# Patient Record
Sex: Female | Born: 1945 | Race: White | Hispanic: No | Marital: Married | Smoking: Former smoker
Health system: Southern US, Community
[De-identification: ages and names within clinical notes are randomized; demographics above are authoritative.]

## PROBLEM LIST (undated history)

## (undated) DIAGNOSIS — H269 Unspecified cataract: Secondary | ICD-10-CM

## (undated) DIAGNOSIS — F32A Depression, unspecified: Secondary | ICD-10-CM

## (undated) DIAGNOSIS — D126 Benign neoplasm of colon, unspecified: Secondary | ICD-10-CM

## (undated) DIAGNOSIS — M87059 Idiopathic aseptic necrosis of unspecified femur: Secondary | ICD-10-CM

## (undated) DIAGNOSIS — T7840XA Allergy, unspecified, initial encounter: Secondary | ICD-10-CM

## (undated) DIAGNOSIS — C3490 Malignant neoplasm of unspecified part of unspecified bronchus or lung: Secondary | ICD-10-CM

## (undated) DIAGNOSIS — C50919 Malignant neoplasm of unspecified site of unspecified female breast: Secondary | ICD-10-CM

## (undated) DIAGNOSIS — G609 Hereditary and idiopathic neuropathy, unspecified: Secondary | ICD-10-CM

## (undated) DIAGNOSIS — K8020 Calculus of gallbladder without cholecystitis without obstruction: Secondary | ICD-10-CM

## (undated) DIAGNOSIS — M1990 Unspecified osteoarthritis, unspecified site: Secondary | ICD-10-CM

## (undated) DIAGNOSIS — Z923 Personal history of irradiation: Secondary | ICD-10-CM

## (undated) DIAGNOSIS — J309 Allergic rhinitis, unspecified: Secondary | ICD-10-CM

## (undated) DIAGNOSIS — E785 Hyperlipidemia, unspecified: Secondary | ICD-10-CM

## (undated) DIAGNOSIS — F329 Major depressive disorder, single episode, unspecified: Secondary | ICD-10-CM

## (undated) DIAGNOSIS — L661 Lichen planopilaris, unspecified: Secondary | ICD-10-CM

## (undated) DIAGNOSIS — I2510 Atherosclerotic heart disease of native coronary artery without angina pectoris: Secondary | ICD-10-CM

## (undated) DIAGNOSIS — N390 Urinary tract infection, site not specified: Secondary | ICD-10-CM

## (undated) DIAGNOSIS — K219 Gastro-esophageal reflux disease without esophagitis: Secondary | ICD-10-CM

## (undated) DIAGNOSIS — K589 Irritable bowel syndrome without diarrhea: Secondary | ICD-10-CM

## (undated) DIAGNOSIS — R569 Unspecified convulsions: Secondary | ICD-10-CM

## (undated) HISTORY — DX: Unspecified cataract: H26.9

## (undated) HISTORY — DX: Hyperlipidemia, unspecified: E78.5

## (undated) HISTORY — DX: Depression, unspecified: F32.A

## (undated) HISTORY — DX: Malignant neoplasm of unspecified site of unspecified female breast: C50.919

## (undated) HISTORY — DX: Idiopathic aseptic necrosis of unspecified femur: M87.059

## (undated) HISTORY — DX: Gastro-esophageal reflux disease without esophagitis: K21.9

## (undated) HISTORY — PX: CATARACT EXTRACTION: SUR2

## (undated) HISTORY — DX: Malignant neoplasm of unspecified part of unspecified bronchus or lung: C34.90

## (undated) HISTORY — PX: UPPER GASTROINTESTINAL ENDOSCOPY: SHX188

## (undated) HISTORY — PX: TUBAL LIGATION: SHX77

## (undated) HISTORY — PX: EYE SURGERY: SHX253

## (undated) HISTORY — DX: Benign neoplasm of colon, unspecified: D12.6

## (undated) HISTORY — PX: COLONOSCOPY: SHX174

## (undated) HISTORY — DX: Allergy, unspecified, initial encounter: T78.40XA

## (undated) HISTORY — DX: Lichen planopilaris: L66.1

## (undated) HISTORY — PX: APPENDECTOMY: SHX54

## (undated) HISTORY — PX: TOTAL HIP ARTHROPLASTY: SHX124

## (undated) HISTORY — DX: Lichen planopilaris, unspecified: L66.10

## (undated) HISTORY — DX: Allergic rhinitis, unspecified: J30.9

## (undated) HISTORY — DX: Irritable bowel syndrome without diarrhea: K58.9

## (undated) HISTORY — DX: Unspecified convulsions: R56.9

## (undated) HISTORY — PX: OTHER SURGICAL HISTORY: SHX169

## (undated) HISTORY — DX: Hereditary and idiopathic neuropathy, unspecified: G60.9

## (undated) HISTORY — DX: Major depressive disorder, single episode, unspecified: F32.9

## (undated) HISTORY — PX: TOTAL KNEE ARTHROPLASTY: SHX125

## (undated) HISTORY — PX: LIPOSUCTION: SHX10

---

## 2013-09-23 DIAGNOSIS — Z923 Personal history of irradiation: Secondary | ICD-10-CM

## 2013-09-23 DIAGNOSIS — C50919 Malignant neoplasm of unspecified site of unspecified female breast: Secondary | ICD-10-CM

## 2013-09-23 HISTORY — DX: Malignant neoplasm of unspecified site of unspecified female breast: C50.919

## 2013-09-23 HISTORY — DX: Personal history of irradiation: Z92.3

## 2013-09-23 HISTORY — PX: BREAST LUMPECTOMY: SHX2

## 2015-02-10 ENCOUNTER — Other Ambulatory Visit: Payer: Self-pay | Admitting: Obstetrics and Gynecology

## 2015-02-14 ENCOUNTER — Other Ambulatory Visit: Payer: Self-pay | Admitting: Obstetrics and Gynecology

## 2015-02-14 DIAGNOSIS — Z9889 Other specified postprocedural states: Secondary | ICD-10-CM

## 2015-02-15 ENCOUNTER — Telehealth: Payer: Self-pay | Admitting: *Deleted

## 2015-02-15 NOTE — Telephone Encounter (Signed)
Received referral from Dr. Sherran Needs office to schedule pt for a med onc appt w/ Dr. Lindi Adie.  Called pt and confirmed 03/13/15 appt w/ pt.  Called and left messages at previous facilities to obtain records.  Mailed calendar, welcoming packet & intake form to pt.  Emailed Dr. Radene Knee to make him aware.

## 2015-02-22 ENCOUNTER — Ambulatory Visit
Admission: RE | Admit: 2015-02-22 | Discharge: 2015-02-22 | Disposition: A | Discharge status: Home / Self Care | Payer: Medicare Other | Source: Ambulatory Visit | Attending: Obstetrics and Gynecology | Admitting: Obstetrics and Gynecology

## 2015-02-22 DIAGNOSIS — Z9889 Other specified postprocedural states: Secondary | ICD-10-CM

## 2015-02-28 ENCOUNTER — Encounter: Payer: Self-pay | Admitting: *Deleted

## 2015-02-28 NOTE — Progress Notes (Signed)
Received all records from outside facilities.  Placed a copy in Dr. Geralyn Flash box and took one to HIM to scan.

## 2015-03-13 ENCOUNTER — Ambulatory Visit: Payer: Self-pay

## 2015-03-13 ENCOUNTER — Telehealth: Payer: Self-pay

## 2015-03-13 ENCOUNTER — Ambulatory Visit: Payer: Self-pay | Admitting: Hematology and Oncology

## 2015-03-13 ENCOUNTER — Telehealth: Payer: Self-pay | Admitting: *Deleted

## 2015-03-13 NOTE — Telephone Encounter (Signed)
LMOVM - pt to call clinic if she wants to reschedule todays appt.

## 2015-03-13 NOTE — Telephone Encounter (Signed)
Received message from Rhys Martini to call pt.  Called pt and she forgot about her appt today.  Rescheduled and confirmed 03/20/15 appt w/ pt.  Mailed new packet w/ calendar to pt.

## 2015-03-15 ENCOUNTER — Other Ambulatory Visit: Payer: Self-pay

## 2015-03-20 ENCOUNTER — Ambulatory Visit (HOSPITAL_BASED_OUTPATIENT_CLINIC_OR_DEPARTMENT_OTHER): Payer: Medicare Other | Admitting: Hematology and Oncology

## 2015-03-20 ENCOUNTER — Ambulatory Visit: Payer: Medicare Other

## 2015-03-20 ENCOUNTER — Telehealth: Payer: Self-pay | Admitting: Hematology and Oncology

## 2015-03-20 ENCOUNTER — Encounter: Payer: Self-pay | Admitting: Hematology and Oncology

## 2015-03-20 VITALS — BP 115/62 | HR 67 | Temp 98.2°F | Resp 20 | Wt 182.9 lb

## 2015-03-20 DIAGNOSIS — Z79811 Long term (current) use of aromatase inhibitors: Secondary | ICD-10-CM | POA: Diagnosis not present

## 2015-03-20 DIAGNOSIS — Z170 Estrogen receptor positive status [ER+]: Secondary | ICD-10-CM

## 2015-03-20 DIAGNOSIS — C50512 Malignant neoplasm of lower-outer quadrant of left female breast: Secondary | ICD-10-CM | POA: Insufficient documentation

## 2015-03-20 DIAGNOSIS — C50912 Malignant neoplasm of unspecified site of left female breast: Secondary | ICD-10-CM

## 2015-03-20 DIAGNOSIS — Z853 Personal history of malignant neoplasm of breast: Secondary | ICD-10-CM | POA: Insufficient documentation

## 2015-03-20 NOTE — Telephone Encounter (Signed)
Called patient and she is aware of her 1monthappointment

## 2015-03-20 NOTE — Progress Notes (Signed)
New pt intake form rcvd.  Chart updated.  Sent to scan.   Medical Records rcvd from pt.  Reviewed by Dr. Lindi Adie, Sent to scan.

## 2015-03-20 NOTE — Progress Notes (Signed)
I checked in new patient with no issues prior to seeing the dr

## 2015-03-20 NOTE — Assessment & Plan Note (Signed)
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 Severe emotional problems requiring Zoloft, Ambien, clonazepam which she attributes partially to the medication as well as stool her mother passing away last year along with moving to [place]. She is quite or rebound. 2. Skin dryness 3. Lack of feeling of wellness  Plan: 1. Treatment break from anastrozole for 3 months 2. When she returns if her symptoms haven't gotten better than we can associate those for anastrozole and we can plan on changing therapy to another aromatase inhibitor versus using tamoxifen.  Return to clinic in 3 months to assess benefits of treatment break and follow-up.

## 2015-03-20 NOTE — Progress Notes (Signed)
[facility] CONSULT NOTE  Patient Care Team: No Pcp Per Patient as PCP - General (General Practice)  CHIEF COMPLAINTS/PURPOSE OF CONSULTATION:  History of breast cancer   HISTORY OF PRESENTING ILLNESS:  Tara Murphy 69 y.o. female is here because of moving to [place] from [place]. She was diagnosed with left breast cancer with an abnormal mammogram end of October 2014. She subsequently underwent a biopsy and then underwent a lumpectomy in January 2015. She was diagnosed with stage IA breast cancer that was ER/PR positive HER-2 negative. She had Oncotype DX showing a recurrence score of 8, 6% ROR with tamoxifen alone. She was started on anastrozole after MammoSite radiation. She has been on anastrozole for the past year and reports that she has profound emotional problems. She was started on Zoloft and Ambien and clonazepam or the course of the last year to deal with her emotional problems. She is here because she more to [facility] and wanted to have oncology follow-up.  She stays as active as she can be with exercise [place] and staying busy and active all the time.  I reviewed her records extensively and collaborated the history with the patient.  SUMMARY OF ONCOLOGIC HISTORY:   Breast cancer, left breast   10/15/2013 Surgery Left breast lumpectomy: Well-differentiated IDC grade 1, 1.1 cm with intermediate grade DCIS solid and cribriform, margins negative, no LVID, 0/2 sentinel nodes negative, T1 cN0 M0 stage IA, Oncotype DX score 8, (6% ROR) ER/PR positive HER-2 negative   11/15/2013 - 11/16/2013 Radiation Therapy MammoSite partial breast radiation   12/17/2013 -  Anti-estrogen oral therapy Anastrozole 1 mg daily being held until 03/20/2015 for severe fatigue, emotional changes, skin dryness    MEDICAL HISTORY:  1. Lichen planus pilaris 2. 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 FAMILY HISTORY: No family history of breast cancer  ALLERGIES:  has No Known Allergies.  MEDICATIONS:  Current Outpatient Prescriptions  Medication Sig Dispense Refill  . anastrozole (ARIMIDEX) 1 MG tablet     . clobetasol (TEMOVATE) 0.05 % external solution     . pantoprazole (PROTONIX) 40 MG tablet     . sertraline (ZOLOFT) 50 MG tablet     . zolpidem (AMBIEN) 5 MG tablet      No current facility-administered medications for this visit.    REVIEW OF SYSTEMS:   Constitutional: Denies fevers, chills or abnormal night sweats Eyes: Denies blurriness of vision, double vision or watery eyes Ears, nose, mouth, throat, and face: Denies mucositis or sore throat Respiratory: Denies cough, dyspnea or wheezes Cardiovascular: Denies palpitation, chest discomfort or lower extremity swelling Gastrointestinal:  Denies nausea, heartburn or change in bowel habits Skin: Denies abnormal skin rashes Lymphatics: Denies new lymphadenopathy or easy bruising Neurological:Denies numbness, tingling or new weaknesses Behavioral/Psych: Mood is stable, no new changes  Breast:  Denies any palpable lumps or discharge All other systems were reviewed with the patient and are negative.  PHYSICAL EXAMINATION: ECOG PERFORMANCE STATUS: 0 - Asymptomatic  Filed Vitals:   03/20/15 1230  BP: 115/62  Pulse: 67  Temp: 98.2 F (36.8  C)  Resp: 20   Filed Weights   03/20/15 1230  Weight: 182 lb 14.4 oz (82.963 kg)    GENERAL:alert, no distress and comfortable SKIN: skin color, texture, turgor are normal, no rashes or significant lesions EYES: normal, conjunctiva are pink  and non-injected, sclera clear OROPHARYNX:no exudate, no erythema and lips, buccal mucosa, and tongue normal  NECK: supple, thyroid normal size, non-tender, without nodularity LYMPH:  no palpable lymphadenopathy in the cervical, axillary or inguinal LUNGS: clear to auscultation and percussion with normal breathing effort HEART: regular rate & rhythm and no murmurs and no lower extremity edema ABDOMEN:abdomen soft, non-tender and normal bowel sounds Musculoskeletal:no cyanosis of digits and no clubbing  PSYCH: alert & oriented x 3 with fluent speech NEURO: no focal motor/sensory deficits  ASSESSMENT AND PLAN:  Breast cancer, left breast 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 Severe emotional problems requiring Zoloft, Ambien, clonazepam which she attributes partially to the medication as well as stool her mother passing away last year along with moving to [place]. She is quite or rebound. 2. Skin dryness 3. Lack of feeling of wellness  Plan: 1. Treatment break from anastrozole for 3 months 2. When she returns if her symptoms haven't gotten better than we can associate those for anastrozole and we can plan on changing therapy to another aromatase inhibitor versus using tamoxifen.  Return to clinic in 3 months to assess benefits of treatment break and follow-up.  All questions were answered. The patient knows to call the clinic with any problems, questions or concerns.    Rulon Eisenmenger, MD 1:48 PM

## 2015-05-10 ENCOUNTER — Ambulatory Visit
Admission: RE | Admit: 2015-05-10 | Discharge: 2015-05-10 | Disposition: A | Discharge status: Home / Self Care | Payer: Medicare Other | Source: Ambulatory Visit | Attending: Obstetrics and Gynecology | Admitting: Obstetrics and Gynecology

## 2015-05-10 ENCOUNTER — Encounter (INDEPENDENT_AMBULATORY_CARE_PROVIDER_SITE_OTHER): Payer: Self-pay

## 2015-06-13 DIAGNOSIS — J309 Allergic rhinitis, unspecified: Secondary | ICD-10-CM | POA: Insufficient documentation

## 2015-06-13 DIAGNOSIS — N390 Urinary tract infection, site not specified: Secondary | ICD-10-CM | POA: Insufficient documentation

## 2015-06-13 DIAGNOSIS — G609 Hereditary and idiopathic neuropathy, unspecified: Secondary | ICD-10-CM | POA: Insufficient documentation

## 2015-06-13 DIAGNOSIS — Z8739 Personal history of other diseases of the musculoskeletal system and connective tissue: Secondary | ICD-10-CM | POA: Insufficient documentation

## 2015-06-16 DIAGNOSIS — Z8619 Personal history of other infectious and parasitic diseases: Secondary | ICD-10-CM | POA: Insufficient documentation

## 2015-06-19 NOTE — Assessment & Plan Note (Signed)
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 Severe emotional problems requiring Zoloft, Ambien, clonazepam which she attributes partially to the medication as well as stool her mother passing away last year along with moving to [place]. She is quite or rebound. 2. Skin dryness 3. Lack of feeling of wellness  Plan: 1. Treatment break from anastrozole for 3 months 2.  Return to clinic in 3 months to assess benefits of treatment break and follow-up.

## 2015-06-20 ENCOUNTER — Ambulatory Visit (HOSPITAL_BASED_OUTPATIENT_CLINIC_OR_DEPARTMENT_OTHER): Payer: Medicare Other | Admitting: Hematology and Oncology

## 2015-06-20 ENCOUNTER — Encounter: Payer: Self-pay | Admitting: Hematology and Oncology

## 2015-06-20 ENCOUNTER — Telehealth: Payer: Self-pay | Admitting: Hematology and Oncology

## 2015-06-20 VITALS — BP 132/67 | HR 75 | Temp 98.8°F | Resp 20 | Ht 67.0 in | Wt 185.3 lb

## 2015-06-20 DIAGNOSIS — C50912 Malignant neoplasm of unspecified site of left female breast: Secondary | ICD-10-CM | POA: Diagnosis present

## 2015-06-20 MED ORDER — TAMOXIFEN CITRATE 20 MG PO TABS: 20.0000 mg | ORAL_TABLET | Freq: Every day | ORAL | Status: DC

## 2015-06-20 NOTE — Telephone Encounter (Signed)
Gave avs & calendar for December. °

## 2015-06-20 NOTE — Progress Notes (Signed)
Patient Care Team: No Pcp Per Patient as PCP - General (General Practice)  DIAGNOSIS: No matching staging information was found for the patient.  SUMMARY OF ONCOLOGIC HISTORY:   Breast cancer, left breast   10/15/2013 Surgery Left breast lumpectomy: Well-differentiated IDC grade 1, 1.1 cm with intermediate grade DCIS solid and cribriform, margins negative, no LVID, 0/2 sentinel nodes negative, T1 cN0 M0 stage IA, Oncotype DX score 8, (6% ROR) ER/PR positive HER-2 negative   11/15/2013 - 11/16/2013 Radiation Therapy MammoSite partial breast radiation   12/17/2013 -  Anti-estrogen oral therapy Anastrozole 1 mg daily (severe fatigue, emotional changes, skin dryness), switched to tamoxifen 06/20/2015    CHIEF COMPLIANT: follow-up after treatment break on anastrozole  INTERVAL HISTORY: Tara Murphy is a 69 year old lady with above-mentioned history of left breast cancer who could not tolerate anastrozole. She had profound emotional changes and she came off anastrozole for 3 months. She is here today to discuss a treatment plan. Energy levels had improved emotional changes had gotten better. But she would like to try an alternative antiestrogen treatment. She was also diagnosed with C. Difficile enterocolitis and is on treatment for it. She is also concerned that she has low back problems from arthritis. She is not thinking that some of the side effects she experienced may not entirely be related to the pill.  REVIEW OF SYSTEMS:   Constitutional: Denies fevers, chills or abnormal weight loss Eyes: Denies blurriness of vision Ears, nose, mouth, throat, and face: Denies mucositis or sore throat Respiratory: Denies cough, dyspnea or wheezes Cardiovascular: Denies palpitation, chest discomfort or lower extremity swelling Gastrointestinal:  Denies nausea, heartburn or change in bowel habits Skin: Denies abnormal skin rashes Lymphatics: Denies new lymphadenopathy or easy bruising Neurological:Denies  numbness, tingling or new weaknesses Behavioral/Psych: Mood is stable, no new changes   All other systems were reviewed with the patient and are negative.  I have reviewed the past medical history, past surgical history, social history and family history with the patient and they are unchanged from previous note.  ALLERGIES:  is allergic to contrast media; fentanyl; hydromorphone; and terbinafine and related.  MEDICATIONS:  Current Outpatient Prescriptions  Medication Sig Dispense Refill  . clobetasol (TEMOVATE) 0.05 % external solution     . clonazePAM (KLONOPIN) 0.5 MG tablet Take 0.5 mg by mouth daily.    . fluticasone (FLONASE) 50 MCG/ACT nasal spray Place 1 spray into the nose.    . gabapentin (NEURONTIN) 300 MG capsule Take 300 mg by mouth 2 (two) times daily.    . pantoprazole (PROTONIX) 40 MG tablet     . sertraline (ZOLOFT) 50 MG tablet     . zolpidem (AMBIEN) 5 MG tablet     . tamoxifen (NOLVADEX) 20 MG tablet Take 1 tablet (20 mg total) by mouth daily. 90 tablet 3   No current facility-administered medications for this visit.    PHYSICAL EXAMINATION: ECOG PERFORMANCE STATUS: 1 - Symptomatic but completely ambulatory  Filed Vitals:   06/20/15 1320  BP: 132/67  Pulse: 75  Temp: 98.8 F (37.1 C)  Resp: 20   Filed Weights   06/20/15 1320  Weight: 185 lb 4.8 oz (84.052 kg)    GENERAL:alert, no distress and comfortable SKIN: skin color, texture, turgor are normal, no rashes or significant lesions EYES: normal, Conjunctiva are pink and non-injected, sclera clear OROPHARYNX:no exudate, no erythema and lips, buccal mucosa, and tongue normal  NECK: supple, thyroid normal size, non-tender, without nodularity LYMPH:  no palpable lymphadenopathy in  the cervical, axillary or inguinal LUNGS: clear to auscultation and percussion with normal breathing effort HEART: regular rate & rhythm and no murmurs and no lower extremity edema ABDOMEN:abdomen soft, non-tender and normal  bowel sounds Musculoskeletal:no cyanosis of digits and no clubbing  NEURO: lower extremity pain with exertion  LABORATORY DATA:  I have reviewed the data as listed   Chemistry   No results found for: NA, K, CL, CO2, BUN, CREATININE, GLU No results found for: CALCIUM, ALKPHOS, AST, ALT, BILITOT     No results found for: WBC, HGB, HCT, MCV, PLT, NEUTROABS   ASSESSMENT & PLAN:  Breast cancer, left breast 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 Skin dryness and Lack of feeling of wellness  Plan: 1. Try tamoxifen 20 mg daily 2. If she cannot tolerate tamoxifen and then we will discontinue anti-estrogen therapy. Return to clinic in 3 months to assess toxicities of tamoxifen and follow-up.  Patient joined [employer] and is excited about getting exercise.  No orders of the defined types were placed in this encounter.   The patient has a good understanding of the overall plan. she agrees with it. she will call with any problems that may develop before the next visit here.   Rulon Eisenmenger, MD

## 2015-09-04 NOTE — Assessment & Plan Note (Signed)
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 Skin dryness and Lack of feeling of wellness  Tamoxifen Toxicities:   2. If she cannot tolerate tamoxifen and then we will discontinue anti-estrogen therapy. Return to clinic in 6 months to assess toxicities of tamoxifen and follow-up.

## 2015-09-05 ENCOUNTER — Ambulatory Visit (HOSPITAL_BASED_OUTPATIENT_CLINIC_OR_DEPARTMENT_OTHER): Payer: Medicare Other | Admitting: Hematology and Oncology

## 2015-09-05 ENCOUNTER — Encounter: Payer: Self-pay | Admitting: Hematology and Oncology

## 2015-09-05 VITALS — BP 131/65 | HR 86 | Temp 97.5°F | Resp 18 | Ht 67.0 in | Wt 188.3 lb

## 2015-09-05 DIAGNOSIS — C50512 Malignant neoplasm of lower-outer quadrant of left female breast: Secondary | ICD-10-CM

## 2015-09-05 DIAGNOSIS — F329 Major depressive disorder, single episode, unspecified: Secondary | ICD-10-CM | POA: Insufficient documentation

## 2015-09-05 DIAGNOSIS — Z853 Personal history of malignant neoplasm of breast: Secondary | ICD-10-CM | POA: Diagnosis present

## 2015-09-05 HISTORY — DX: Major depressive disorder, single episode, unspecified: F32.9

## 2015-09-05 NOTE — Progress Notes (Signed)
Patient Care Team: No Pcp Per Patient as PCP - General (General Practice)  DIAGNOSIS: No matching staging information was found for the patient.  SUMMARY OF ONCOLOGIC HISTORY:   Breast cancer of lower-outer quadrant of left female breast ([place])   10/15/2013 Surgery Left breast lumpectomy: Well-differentiated IDC grade 1, 1.1 cm with intermediate grade DCIS solid and cribriform, margins negative, no LVID, 0/2 sentinel nodes negative, T1 cN0 M0 stage IA, Oncotype DX score 8, (6% ROR) ER/PR positive HER-2 negative   11/15/2013 - 11/16/2013 Radiation Therapy MammoSite partial breast radiation   12/17/2013 - 08/29/2015 Anti-estrogen oral therapy Anastrozole 1 mg daily (severe fatigue, emotional changes, skin dryness), switched to tamoxifen 06/20/2015 (stopped due to depression)    CHIEF COMPLIANT: stopped tamoxifen therapy  INTERVAL HISTORY: Tara Murphy is a 69 year old with above-mentioned history of left breast cancer treated with lumpectomy and MammoSite radiation. She was on anastrozole which was later switched to tamoxifen because of psychological problems. She tolerated tamoxifen lot better however she had episodes of depression and hence she discontinued it on 08/29/2015. She does not want to take any antiestrogen therapy further.  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  I have reviewed the past medical history, past surgical history, social history and family history with the patient and they are unchanged from previous note.  ALLERGIES:  is allergic to contrast media; fentanyl; hydromorphone; and terbinafine and related.  MEDICATIONS:  Current Outpatient Prescriptions  Medication Sig Dispense Refill  . clobetasol (TEMOVATE) 0.05 % external solution     . clonazePAM (KLONOPIN) 0.5 MG tablet Take 0.5 mg by mouth daily.    . fluticasone (FLONASE) 50 MCG/ACT nasal spray Place 1 spray into the nose.    . gabapentin (NEURONTIN) 300 MG capsule Take 300 mg by mouth 2 (two) times daily.    . pantoprazole (PROTONIX) 40 MG tablet     . sertraline (ZOLOFT) 50 MG tablet     . tamoxifen (NOLVADEX) 20 MG tablet Take 1 tablet (20 mg total) by mouth daily. 90 tablet 3  . zolpidem (AMBIEN) 5 MG tablet      No current facility-administered medications for this visit.    PHYSICAL EXAMINATION: ECOG PERFORMANCE STATUS: 1 - Symptomatic but completely ambulatory  Filed Vitals:   09/05/15 1406  BP: 131/65  Pulse: 86  Temp: 97.5 F (36.4 C)  Resp: 18   Filed Weights   09/05/15 1406  Weight: 188 lb 4.8 oz (85.412 kg)    GENERAL:alert, no distress and comfortable SKIN: skin color, texture, turgor are normal, no rashes or significant lesions EYES: normal, Conjunctiva are pink and non-injected, sclera clear OROPHARYNX:no exudate, no erythema and lips, buccal mucosa, and tongue normal  NECK: supple, thyroid normal size, non-tender, without nodularity LYMPH:  no palpable lymphadenopathy in the cervical, axillary or inguinal LUNGS: clear to auscultation and percussion with normal breathing effort HEART: regular rate & rhythm and no murmurs and no lower extremity  edema ABDOMEN:abdomen soft, non-tender and normal bowel sounds Musculoskeletal:no cyanosis of digits and no clubbing  NEURO: alert & oriented x 3 with fluent  speech, no focal motor/sensory deficits   ASSESSMENT & PLAN:  Breast cancer of lower-outer quadrant of left female breast ([place]) 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 Skin dryness and Lack of feeling of wellness  Tamoxifen Toxicities: she tolerated tamoxifen better but she had episodes of major depression and hence she discontinued it after being on it for 3 months.  Return to clinic in 1 year for follow-up  No orders of the defined types were placed in this encounter.   The patient has a good understanding of the overall plan. she agrees with it. she will call with any problems that may develop before the next visit here.   Rulon Eisenmenger, MD 09/05/2015

## 2015-09-06 ENCOUNTER — Telehealth: Payer: Self-pay | Admitting: Hematology and Oncology

## 2015-09-06 NOTE — Telephone Encounter (Signed)
s.w. pt and advised DEC appt....pt ok and aware

## 2015-09-27 DIAGNOSIS — M4726 Other spondylosis with radiculopathy, lumbar region: Secondary | ICD-10-CM | POA: Insufficient documentation

## 2015-09-29 ENCOUNTER — Encounter (HOSPITAL_COMMUNITY): Payer: Self-pay | Admitting: *Deleted

## 2015-09-29 ENCOUNTER — Emergency Department (HOSPITAL_COMMUNITY)
Admission: EM | Admit: 2015-09-29 | Discharge: 2015-09-29 | Disposition: A | Discharge status: Home / Self Care | Payer: Medicare Other | Attending: Emergency Medicine | Admitting: Emergency Medicine

## 2015-09-29 DIAGNOSIS — M25551 Pain in right hip: Secondary | ICD-10-CM

## 2015-09-29 DIAGNOSIS — Z872 Personal history of diseases of the skin and subcutaneous tissue: Secondary | ICD-10-CM | POA: Insufficient documentation

## 2015-09-29 DIAGNOSIS — Z87891 Personal history of nicotine dependence: Secondary | ICD-10-CM | POA: Diagnosis not present

## 2015-09-29 DIAGNOSIS — Z853 Personal history of malignant neoplasm of breast: Secondary | ICD-10-CM | POA: Diagnosis not present

## 2015-09-29 DIAGNOSIS — Z79899 Other long term (current) drug therapy: Secondary | ICD-10-CM | POA: Insufficient documentation

## 2015-09-29 DIAGNOSIS — R1030 Lower abdominal pain, unspecified: Secondary | ICD-10-CM | POA: Insufficient documentation

## 2015-09-29 NOTE — ED Notes (Addendum)
Pt reports right hip pain and groin pain, has been to pcp and had xray and MRI ordered. Pt went to get MRI done last night but unable to tolerate lying on her back due to pain. Requesting an MRI with sedation. Pt took percocet pta.

## 2015-09-29 NOTE — Discharge Instructions (Signed)

## 2015-09-29 NOTE — ED Provider Notes (Signed)
CSN: [id number]     Arrival date & time 09/29/15  1339 History   First MD Initiated Contact with Patient 09/29/15 1708     Chief Complaint  Patient presents with  . Hip Pain  . Groin Pain     (Consider location/radiation/quality/duration/timing/severity/associated sxs/prior Treatment) Patient is a 70 y.o. female presenting with hip pain and groin pain. The history is provided by the patient.  Hip Pain This is a chronic problem. The problem occurs constantly. The problem has been gradually worsening. Pertinent negatives include no chest pain and no abdominal pain. Nothing aggravates the symptoms. Nothing relieves the symptoms. Treatments tried: percocet/hydrocodone.  Groin Pain Pertinent negatives include no chest pain and no abdominal pain.    Past Medical History  Diagnosis Date  . Breast cancer ([place])   . Lichen plano-pilaris    Past Surgical History  Procedure Laterality Date  . Appendectomy    . Total hip arthroplasty    . Total knee arthroplasty    . Tubal ligation    . Cataracts    . Liposuction     History reviewed. No pertinent family history. Social History  Substance Use Topics  . Smoking status: Former Smoker    Types: Cigarettes    Quit date: 11/22/1991  . Smokeless tobacco: Never Used  . Alcohol Use: 4.2 oz/week    7 Glasses of wine per week   OB History    No data available     Review of Systems  Cardiovascular: Negative for chest pain.  Gastrointestinal: Negative for abdominal pain.  All other systems reviewed and are negative.     Allergies  Contrast media; Fentanyl; Hydromorphone; and Terbinafine and related  Home Medications   Prior to Admission medications   Medication Sig Start Date End Date Taking? Authorizing Provider  clobetasol (TEMOVATE) 0.05 % external solution  03/05/15   Historical Provider, MD  clonazePAM (KLONOPIN) 0.5 MG tablet Take 0.5 mg by mouth daily.    Historical Provider, MD  fluticasone (FLONASE) 50 MCG/ACT nasal spray  Place 1 spray into the nose.    Historical Provider, MD  gabapentin (NEURONTIN) 300 MG capsule Take 300 mg by mouth 2 (two) times daily.    Historical Provider, MD  pantoprazole (PROTONIX) 40 MG tablet  02/22/15   Historical Provider, MD  sertraline (ZOLOFT) 50 MG tablet  03/15/15   Historical Provider, MD  tamoxifen (NOLVADEX) 20 MG tablet Take 1 tablet (20 mg total) by mouth daily. 06/20/15   Nicholas Lose, MD  zolpidem (AMBIEN) 5 MG tablet  02/22/15   Historical Provider, MD   BP 132/73 mmHg  Pulse 85  Temp(Src) 97.9 F (36.6 C) (Oral)  Resp 18  SpO2 99% Physical Exam  Constitutional: She is oriented to person, place, and time. She appears well-developed and well-nourished. No distress.  HENT:  Head: Normocephalic.  Eyes: Conjunctivae are normal.  Neck: Neck supple. No tracheal deviation present.  Cardiovascular: Normal rate and regular rhythm.   Pulmonary/Chest: Effort normal. No respiratory distress.  Abdominal: Soft. She exhibits no distension.  Neurological: She is alert and oriented to person, place, and time.  Skin: Skin is warm and dry.  Psychiatric: She has a normal mood and affect.    ED Course  Procedures (including critical care time) Labs Review Labs Reviewed - No data to display  Imaging Review No results found. I have personally reviewed and evaluated these images and lab results as part of my medical decision-making.   EKG Interpretation None  MDM   Final diagnoses:  Right hip pain    70 year old female presenting with right hip pain that has been ongoing for multiple weeks. She had been seen by her primary care physician for pain in her groin and was unable to tolerate an outpatient MRI. She was instructed to come here for MRI with sedation. I explained to the patient that we do not do outpatient MRI from the emergency department and we do not offer elective sedation services. She is instructed to speak with her primary care doctor regarding  rescheduling.    Leo Grosser, MD 09/29/15 [phone]

## 2015-10-04 ENCOUNTER — Other Ambulatory Visit: Payer: Self-pay | Admitting: Orthopedic Surgery

## 2015-10-04 DIAGNOSIS — M25551 Pain in right hip: Secondary | ICD-10-CM

## 2015-10-07 ENCOUNTER — Ambulatory Visit
Admission: RE | Admit: 2015-10-07 | Discharge: 2015-10-07 | Disposition: A | Discharge status: Home / Self Care | Payer: Medicare Other | Source: Ambulatory Visit | Attending: Orthopedic Surgery | Admitting: Orthopedic Surgery

## 2015-10-07 DIAGNOSIS — M25551 Pain in right hip: Secondary | ICD-10-CM

## 2015-12-05 ENCOUNTER — Ambulatory Visit: Payer: Self-pay | Admitting: Orthopedic Surgery

## 2015-12-06 NOTE — Patient Instructions (Signed)
Tara Murphy  12/06/2015   Your procedure is scheduled on: 11/23/2015    Report to [facility] Main  Entrance take [facility]  elevators to 3rd floor to  [place] at    1045 AM.  Call this number if you have problems the morning of surgery [phone]   Remember: ONLY 1 PERSON MAY GO WITH YOU TO SHORT STAY TO GET  READY MORNING OF [place].  Do not eat food after midnite.  May have clear liquids from 12 midnite until 0700am then nothing by mouth.       Take these medicines the morning of surgery with A SIP OF WATER: Flonase if needed, Hydrocodone if needed                                You may not have any metal on your body including hair pins and              piercings  Do not wear jewelry, make-up, lotions, powders or perfumes, deodorant             Do not wear nail polish.  Do not shave  48 hours prior to surgery.                Do not bring valuables to the hospital. [place].  Contacts, dentures or bridgework may not be worn into surgery.  Leave suitcase in the car. After surgery it may be brought to your room.         Special Instructions: coughing and deep breathing exercises, leg exercises               Please read over the following fact sheets you were given: _____________________________________________________________________                CLEAR LIQUID DIET   Foods Allowed                                                                     Foods Excluded  Coffee and tea, regular and decaf                             liquids that you cannot  Plain Jell-O in any flavor                                             see through such as: Fruit ices (not with fruit pulp)                                     milk, soups, orange juice  Iced Popsicles  All solid food Carbonated beverages, regular and diet                                    Cranberry,  grape and apple juices Sports drinks like Gatorade Lightly seasoned clear broth or consume(fat free) Sugar, honey syrup  Sample Menu Breakfast                                Lunch                                     Supper Cranberry juice                    Beef broth                            Chicken broth Jell-O                                     Grape juice                           Apple juice Coffee or tea                        Jell-O                                      Popsicle                                                Coffee or tea                        Coffee or tea  _____________________________________________________________________  [facility] - Preparing for Surgery Before surgery, you can play an important role.  Because skin is not sterile, your skin needs to be as free of germs as possible.  You can reduce the number of germs on your skin by washing with CHG (chlorahexidine gluconate) soap before surgery.  CHG is an antiseptic cleaner which kills germs and bonds with the skin to continue killing germs even after washing. Please DO NOT use if you have an allergy to CHG or antibacterial soaps.  If your skin becomes reddened/irritated stop using the CHG and inform your nurse when you arrive at Short Stay. Do not shave (including legs and underarms) for at least 48 hours prior to the first CHG shower.  You may shave your face/neck. Please follow these instructions carefully:  1.  Shower with CHG Soap the night before surgery and the  morning of Surgery.  2.  If you choose to wash your hair, wash your hair first as usual with your  normal  shampoo.  3.  After you shampoo, rinse your hair and body thoroughly to remove the  shampoo.  4.  Use CHG as you would any other liquid soap.  You can apply chg directly  to the skin and wash                       Gently with a scrungie or clean washcloth.  5.  Apply the CHG Soap to your body ONLY FROM THE NECK  DOWN.   Do not use on face/ open                           Wound or open sores. Avoid contact with eyes, ears mouth and genitals (private parts).                       Wash face,  Genitals (private parts) with your normal soap.             6.  Wash thoroughly, paying special attention to the area where your surgery  will be performed.  7.  Thoroughly rinse your body with warm water from the neck down.  8.  DO NOT shower/wash with your normal soap after using and rinsing off  the CHG Soap.                9.  Pat yourself dry with a clean towel.            10.  Wear clean pajamas.            11.  Place clean sheets on your bed the night of your first shower and do not  sleep with pets. Day of Surgery : Do not apply any lotions/deodorants the morning of surgery.  Please wear clean clothes to the hospital/surgery center.  FAILURE TO FOLLOW THESE INSTRUCTIONS MAY RESULT IN THE CANCELLATION OF YOUR SURGERY PATIENT SIGNATURE_________________________________  NURSE SIGNATURE__________________________________  ________________________________________________________________________  WHAT IS A BLOOD TRANSFUSION? Blood Transfusion Information  A transfusion is the replacement of blood or some of its parts. Blood is made up of multiple cells which provide different functions.  Red blood cells carry oxygen and are used for blood loss replacement.  White blood cells fight against infection.  Platelets control bleeding.  Plasma helps clot blood.  Other blood products are available for specialized needs, such as hemophilia or other clotting disorders. BEFORE THE TRANSFUSION  Who gives blood for transfusions?   Healthy volunteers who are fully evaluated to make sure their blood is safe. This is blood bank blood. Transfusion therapy is the safest it has ever been in the practice of medicine. Before blood is taken from a donor, a complete history is taken to make sure that person has no history of  diseases nor engages in risky social behavior (examples are intravenous drug use or sexual activity with multiple partners). The donor's travel history is screened to minimize risk of transmitting infections, such as malaria. The donated blood is tested for signs of infectious diseases, such as HIV and hepatitis. The blood is then tested to be sure it is compatible with you in order to minimize the chance of a transfusion reaction. If you or a relative donates blood, this is often done in anticipation of surgery and is not appropriate for emergency situations. It takes many days to process the donated blood. RISKS AND COMPLICATIONS Although transfusion therapy is very safe and saves many lives, the main dangers of transfusion include:  1. Getting an infectious disease. 2. Developing a transfusion reaction.  This is an allergic reaction to something in the blood you were given. Every precaution is taken to prevent this. The decision to have a blood transfusion has been considered carefully by your caregiver before blood is given. Blood is not given unless the benefits outweigh the risks. AFTER THE TRANSFUSION  Right after receiving a blood transfusion, you will usually feel much better and more energetic. This is especially true if your red blood cells have gotten low (anemic). The transfusion raises the level of the red blood cells which carry oxygen, and this usually causes an energy increase.  The nurse administering the transfusion will monitor you carefully for complications. HOME CARE INSTRUCTIONS  No special instructions are needed after a transfusion. You may find your energy is better. Speak with your caregiver about any limitations on activity for underlying diseases you may have. SEEK MEDICAL CARE IF:   Your condition is not improving after your transfusion.  You develop redness or irritation at the intravenous (IV) site. SEEK IMMEDIATE MEDICAL CARE IF:  Any of the following symptoms  occur over the next 12 hours:  Shaking chills.  You have a temperature by mouth above 102 F (38.9 C), not controlled by medicine.  Chest, back, or muscle pain.  People around you feel you are not acting correctly or are confused.  Shortness of breath or difficulty breathing.  Dizziness and fainting.  You get a rash or develop hives.  You have a decrease in urine output.  Your urine turns a dark color or changes to pink, red, or brown. Any of the following symptoms occur over the next 10 days:  You have a temperature by mouth above 102 F (38.9 C), not controlled by medicine.  Shortness of breath.  Weakness after normal activity.  The white part of the eye turns yellow (jaundice).  You have a decrease in the amount of urine or are urinating less often.  Your urine turns a dark color or changes to pink, red, or brown. Document Released: 09/06/2000 Document Revised: 12/02/2011 Document Reviewed: 04/25/2008 ExitCare Patient Information 2014 [place].  _______________________________________________________________________  Incentive Spirometer  An incentive spirometer is a tool that can help keep your lungs clear and active. This tool measures how well you are filling your lungs with each breath. Taking long deep breaths may help reverse or decrease the chance of developing breathing (pulmonary) problems (especially infection) following:  A long period of time when you are unable to move or be active. BEFORE THE PROCEDURE   If the spirometer includes an indicator to show your best effort, your nurse or respiratory therapist will set it to a desired goal.  If possible, sit up straight or lean slightly forward. Try not to slouch.  Hold the incentive spirometer in an upright position. INSTRUCTIONS FOR USE  3. Sit on the edge of your bed if possible, or sit up as far as you can in bed or on a chair. 4. Hold the incentive spirometer in an upright  position. 5. Breathe out normally. 6. Place the mouthpiece in your mouth and seal your lips tightly around it. 7. Breathe in slowly and as deeply as possible, raising the piston or the ball toward the top of the column. 8. Hold your breath for 3-5 seconds or for as long as possible. Allow the piston or ball to fall to the bottom of the column. 9. Remove the mouthpiece from your mouth and breathe out normally. 10. Rest for a few seconds and repeat Steps  1 through 7 at least 10 times every 1-2 hours when you are awake. Take your time and take a few normal breaths between deep breaths. 11. The spirometer may include an indicator to show your best effort. Use the indicator as a goal to work toward during each repetition. 12. After each set of 10 deep breaths, practice coughing to be sure your lungs are clear. If you have an incision (the cut made at the time of surgery), support your incision when coughing by placing a pillow or rolled up towels firmly against it. Once you are able to get out of bed, walk around indoors and cough well. You may stop using the incentive spirometer when instructed by your caregiver.  RISKS AND COMPLICATIONS  Take your time so you do not get dizzy or light-headed.  If you are in pain, you may need to take or ask for pain medication before doing incentive spirometry. It is harder to take a deep breath if you are having pain. AFTER USE  Rest and breathe slowly and easily.  It can be helpful to keep track of a log of your progress. Your caregiver can provide you with a simple table to help with this. If you are using the spirometer at home, follow these instructions: [place] IF:   You are having difficultly using the spirometer.  You have trouble using the spirometer as often as instructed.  Your pain medication is not giving enough relief while using the spirometer.  You develop fever of 100.5 F (38.1 C) or higher. SEEK IMMEDIATE MEDICAL CARE IF:    You cough up bloody sputum that had not been present before.  You develop fever of 102 F (38.9 C) or greater.  You develop worsening pain at or near the incision site. MAKE SURE YOU:   Understand these instructions.  Will watch your condition.  Will get help right away if you are not doing well or get worse. Document Released: 01/20/2007 Document Revised: 12/02/2011 Document Reviewed: 03/23/2007 [facility] Patient Information 2014 [place], [place].   ________________________________________________________________________

## 2015-12-07 ENCOUNTER — Encounter (HOSPITAL_COMMUNITY): Payer: Self-pay

## 2015-12-07 ENCOUNTER — Encounter (HOSPITAL_COMMUNITY)
Admission: RE | Admit: 2015-12-07 | Discharge: 2015-12-07 | Disposition: A | Discharge status: Home / Self Care | Payer: Medicare Other | Source: Ambulatory Visit | Attending: Orthopedic Surgery | Admitting: Orthopedic Surgery

## 2015-12-07 DIAGNOSIS — Z01812 Encounter for preprocedural laboratory examination: Secondary | ICD-10-CM | POA: Diagnosis not present

## 2015-12-07 DIAGNOSIS — M1611 Unilateral primary osteoarthritis, right hip: Secondary | ICD-10-CM | POA: Insufficient documentation

## 2015-12-07 DIAGNOSIS — Z0183 Encounter for blood typing: Secondary | ICD-10-CM | POA: Diagnosis not present

## 2015-12-07 HISTORY — DX: Unspecified osteoarthritis, unspecified site: M19.90

## 2015-12-07 HISTORY — DX: Urinary tract infection, site not specified: N39.0

## 2015-12-07 LAB — TYPE AND SCREEN
ABO/RH(D): O POS
ANTIBODY SCREEN: NEGATIVE

## 2015-12-07 LAB — URINALYSIS, ROUTINE W REFLEX MICROSCOPIC
BILIRUBIN URINE: NEGATIVE
Glucose, UA: NEGATIVE mg/dL
HGB URINE DIPSTICK: NEGATIVE
Ketones, ur: NEGATIVE mg/dL
Nitrite: NEGATIVE
PH: 5.5 (ref 5.0–8.0)
Protein, ur: NEGATIVE mg/dL
SPECIFIC GRAVITY, URINE: 1.01 (ref 1.005–1.030)

## 2015-12-07 LAB — COMPREHENSIVE METABOLIC PANEL
ALK PHOS: 89 U/L (ref 38–126)
ALT: 21 U/L (ref 14–54)
AST: 21 U/L (ref 15–41)
Albumin: 4.4 g/dL (ref 3.5–5.0)
Anion gap: 10 (ref 5–15)
BUN: 12 mg/dL (ref 6–20)
CHLORIDE: 105 mmol/L (ref 101–111)
CO2: 24 mmol/L (ref 22–32)
Calcium: 9.5 mg/dL (ref 8.9–10.3)
Creatinine, Ser: 0.58 mg/dL (ref 0.44–1.00)
GFR calc Af Amer: >60 mL/min (ref >60)
Glucose, Bld: 92 mg/dL (ref 65–99)
Potassium: 4.8 mmol/L (ref 3.5–5.1)
SODIUM: 139 mmol/L (ref 135–145)
TOTAL PROTEIN: 7.6 g/dL (ref 6.5–8.1)
Total Bilirubin: 0.5 mg/dL (ref 0.3–1.2)

## 2015-12-07 LAB — ABO/RH: ABO/RH(D): O POS

## 2015-12-07 LAB — SURGICAL PCR SCREEN
MRSA, PCR: NEGATIVE
Staphylococcus aureus: NEGATIVE

## 2015-12-07 LAB — CBC
HCT: 42.2 % (ref 36.0–46.0)
HEMOGLOBIN: 13.7 g/dL (ref 12.0–15.0)
MCH: 30.3 pg (ref 26.0–34.0)
MCHC: 32.5 g/dL (ref 30.0–36.0)
MCV: 93.4 fL (ref 78.0–100.0)
PLATELETS: 394 10*3/uL (ref 150–400)
RBC: 4.52 MIL/uL (ref 3.87–5.11)
RDW: 12.9 % (ref 11.5–15.5)
WBC: 6.1 10*3/uL (ref 4.0–10.5)

## 2015-12-07 LAB — URINE MICROSCOPIC-ADD ON: RBC / HPF: NONE SEEN RBC/hpf (ref 0–5)

## 2015-12-07 LAB — PROTIME-INR
INR: 0.92 (ref 0.00–1.49)
PROTHROMBIN TIME: 12.6 s (ref 11.6–15.2)

## 2015-12-07 LAB — APTT: aPTT: 30 seconds (ref 24–37)

## 2015-12-07 NOTE — Progress Notes (Signed)
U/A and micro results done 12/07/2015 faxed via EPIC to Dr Wynelle Link.

## 2015-12-08 ENCOUNTER — Ambulatory Visit: Payer: Self-pay | Admitting: Orthopedic Surgery

## 2015-12-08 NOTE — H&P (Signed)
Tara Murphy DOB: Jul 22, 1946 Married / Language: English / Race: White Female Date of Admission:  12/18/2015 CC:  Right Hip Pain History of Present Illness  The patient is a 70 year old female who comes in for a preoperative History and Physical. The patient is scheduled for a right total hip arthroplasty (anterior) to be performed by Dr. Dione Plover. [name], MD at [facility] on 12-18-2015. The patient is a 70 year old female who presented for follow up of their hip. The patient is being followed for their right hip pain. They are 2 year(s) out from when symptoms began. Symptoms reported today include: pain. The patient feels that they are doing poorly and report their pain level to be severe. The following medication has been used for pain control: Hydrocodone. The patient followed up on the MRI of their right hip. Unfortunately, her right hip is getting progressively worse. She has had problems on and off for a while but has gotten much worse in the past several months. Pain is in the groin and anterior thigh radiating to her knee. She is having increasing difficulty with activities. She has pain at rest and with ambulation. She has got what appears to be to the start of either rapidly progressive arthritis of the hip or an avascular necrosis with progression. 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 She had a posterior approach on that. She had done several years ago and did well with it. She said she started with a core decompression. She does not want to do anything short of a hip replacement if any surgery is going to be required. With the early collapse with the significant edema in the bone and large effusion, I would say at this point, the most predictable means of getting her better would be a total hip arthroplasty. We did discuss that in detail. She is very interested in doing that. They have been treated conservatively in the past for the above stated  problem and despite conservative measures, they continue to have progressive pain and severe functional limitations and dysfunction. They have failed non-operative management including home exercise, medications, and injections. It is felt that they would benefit from undergoing total joint replacement. Risks and benefits of the procedure have been discussed with the patient and they elect to proceed with surgery. There are no active contraindications to surgery such as ongoing infection or rapidly progressive neurological disease.  Problem List/Past Medical Lumbar DDD (M51.36)  Acute hip pain, right (M25.551)  Primary osteoarthritis of right hip (M16.11)  Breast Cancer  Left-sided Hypercholesterolemia  Hypothyroidism  Osteoarthritis  Menopause  Urinary Tract Infection  Allergies Sulfa Drugs  Rash. HYDROmorphone HCl *ANALGESICS - OPIOID*  Nausea. FentaNYL *ANALGESICS - OPIOID*  Flushing Terbinafine *DERMATOLOGICALS*  Iodinated Contrast  Rash, Hives.  Family History  Cancer  Father. Chronic Obstructive Lung Disease  Mother. Congestive Heart Failure  Mother. Drug / Alcohol Addiction  Father. First Degree Relatives  reported Heart disease in female family member before age 50  Osteoarthritis  Brother, Father, Mother, Sister. child  Social History Children  2 Current drinker  09/19/2015: Currently drinks wine 8-14 times per week Current work status  retired Furniture conservator/restorer weekly; does gym / [employer] Living situation  live with spouse Marital status  married No history of drug/alcohol rehab  Not under pain contract  Number of flights of stairs before winded  2-3 Tobacco / smoke exposure  09/19/2015: no Tobacco use  Former smoker. 09/19/2015: smoke(d) 3 more pack(s) per day [place] following the Right TH-AA to be performed on 12/17/2105.  Medication History Norco (5-'325MG'$  Tablet, 1-2 Tablet  Oral every 8 hours as needed for pain, Taken starting 11/23/2015) Active. ClonazePAM (0.'5MG'$  Tablet, Oral) Active. Fluticasone Propionate (Nasal) (50MCG/DOSE Inhaler, Nasal) Active. Gabapentin ('300MG'$  Tablet, Oral) Active. (Dr Regino Bellow)   Past Surgical History Appendectomy  Date: 2002. Breast Mass; Local Excision  Date: 09/2013. left Cataract Surgery  bilateral Colon Polyp Removal - Colonoscopy  Total Hip Replacement  Date: 2007. left Total Knee Replacement  Date: 2011. right Tubal Ligation    Review of Systems  General Not Present- Chills, Fatigue, Fever, Memory Loss, Night Sweats, Weight Gain and Weight Loss. Skin Not Present- Eczema, Hives, Itching, Lesions and Rash. HEENT Not Present- Dentures, Double Vision, Headache, Hearing Loss, Tinnitus and Visual Loss. Respiratory Not Present- Allergies, Chronic Cough, Coughing up blood, Shortness of breath at rest and Shortness of breath with exertion. Cardiovascular Not Present- Chest Pain, Difficulty Breathing Lying Down, Murmur, Palpitations, Racing/skipping heartbeats and Swelling. Gastrointestinal Not Present- Abdominal Pain, Bloody Stool, Constipation, Diarrhea, Difficulty Swallowing, Heartburn, Jaundice, Loss of appetitie, Nausea and Vomiting. Female Genitourinary Not Present- Blood in Urine, Discharge, Flank Pain, Incontinence, Painful Urination, Urgency, Urinary frequency, Urinary Retention, Urinating at Night and Weak urinary stream. Musculoskeletal Present- Back Pain, Joint Pain, Joint Swelling and Morning Stiffness. Not Present- Muscle Pain, Muscle Weakness and Spasms. Neurological Not Present- Blackout spells, Difficulty with balance, Dizziness, Paralysis, Tremor and Weakness. Psychiatric Not Present- Insomnia.  Vitals  Weight: 182 lb Height: 66in Body Surface Area: 1.92 m Body Mass Index: 29.38 kg/m  BP: 128/64 (Sitting, Right Arm, Standard)  Physical Exam General Mental Status -Alert, cooperative and good  historian. General Appearance-pleasant, Not in acute distress. Orientation-Oriented X3. Build & Nutrition-Well nourished and Well developed.  Head and Neck Head-normocephalic, atraumatic . Neck Global Assessment - supple, no bruit auscultated on the right, no bruit auscultated on the left.  Eye Pupil - Bilateral-Regular and Round. Motion - Bilateral-EOMI.  Chest and Lung Exam Auscultation Breath sounds - clear at anterior chest wall and clear at posterior chest wall. Adventitious sounds - No Adventitious sounds.  Cardiovascular Auscultation Rhythm - Regular rate and rhythm. Heart Sounds - S1 WNL and S2 WNL. Murmurs & Other Heart Sounds: Murmur 1 - Location - Aortic Area and Pulmonic Area. Timing - Early systolic. Grade - II/VI. Character - Low pitched.  Abdomen Palpation/Percussion Tenderness - Abdomen is non-tender to palpation. Rigidity (guarding) - Abdomen is soft. Auscultation Auscultation of the abdomen reveals - Bowel sounds normal.  Female Genitourinary Note: Not done, not pertinent to present illness   Musculoskeletal Note: She is alert and oriented, in no apparent distress. Her left hip can be flexed to about 120, rotated in 30, 30 out, abduct to 40 without pain. The left hip has been replaced. Right hip flexion is about 100. Minimal internal rotation about 10 to 15 of external rotation and 20 of abduction. Plain films do show degenerative change in her hip  RADIOGRAPHS MRI shows significant degenerative change with significant edema in the femoral head. Small area of collapse and a very large effusion.  Assessment & Plan Primary osteoarthritis of right hip (M16.11)  Note:Surgical Plans: Right Total Hip Replacement - Anterior Approach  Disposition: Home  PCP: Dr. Jonni Sanger  Topical TXA  Anesthesia Issues: None  Signed electronically by Ok Edwards, III PA-C

## 2015-12-08 NOTE — Progress Notes (Signed)
Called patient with time change .  Patient aware to arrive at 0940am morning of surgery and surgery time now 1240pm-225pm.

## 2015-12-18 ENCOUNTER — Encounter (HOSPITAL_COMMUNITY)
Admission: RE | Disposition: A | Discharge status: Home Health | Payer: Self-pay | Source: Ambulatory Visit | Attending: Orthopedic Surgery

## 2015-12-18 ENCOUNTER — Inpatient Hospital Stay (HOSPITAL_COMMUNITY): Payer: Medicare Other

## 2015-12-18 ENCOUNTER — Inpatient Hospital Stay (HOSPITAL_COMMUNITY): Payer: Medicare Other | Admitting: Anesthesiology

## 2015-12-18 ENCOUNTER — Encounter (HOSPITAL_COMMUNITY): Payer: Self-pay | Admitting: *Deleted

## 2015-12-18 ENCOUNTER — Inpatient Hospital Stay (HOSPITAL_COMMUNITY)
Admission: RE | Admit: 2015-12-18 | Discharge: 2015-12-19 | DRG: 470 | Disposition: A | Discharge status: Home Health | Payer: Medicare Other | Source: Ambulatory Visit | Attending: Orthopedic Surgery | Admitting: Orthopedic Surgery

## 2015-12-18 DIAGNOSIS — Z96642 Presence of left artificial hip joint: Secondary | ICD-10-CM | POA: Diagnosis present

## 2015-12-18 DIAGNOSIS — Z96649 Presence of unspecified artificial hip joint: Secondary | ICD-10-CM

## 2015-12-18 DIAGNOSIS — E7800 Pure hypercholesterolemia, unspecified: Secondary | ICD-10-CM | POA: Diagnosis present

## 2015-12-18 DIAGNOSIS — Z853 Personal history of malignant neoplasm of breast: Secondary | ICD-10-CM | POA: Diagnosis not present

## 2015-12-18 DIAGNOSIS — Z87891 Personal history of nicotine dependence: Secondary | ICD-10-CM | POA: Diagnosis not present

## 2015-12-18 DIAGNOSIS — M169 Osteoarthritis of hip, unspecified: Secondary | ICD-10-CM | POA: Diagnosis present

## 2015-12-18 DIAGNOSIS — E039 Hypothyroidism, unspecified: Secondary | ICD-10-CM | POA: Diagnosis present

## 2015-12-18 DIAGNOSIS — Z96651 Presence of right artificial knee joint: Secondary | ICD-10-CM | POA: Diagnosis present

## 2015-12-18 DIAGNOSIS — M1611 Unilateral primary osteoarthritis, right hip: Principal | ICD-10-CM | POA: Diagnosis present

## 2015-12-18 DIAGNOSIS — Z01812 Encounter for preprocedural laboratory examination: Secondary | ICD-10-CM

## 2015-12-18 DIAGNOSIS — M1710 Unilateral primary osteoarthritis, unspecified knee: Secondary | ICD-10-CM | POA: Diagnosis present

## 2015-12-18 DIAGNOSIS — M179 Osteoarthritis of knee, unspecified: Secondary | ICD-10-CM | POA: Diagnosis present

## 2015-12-18 DIAGNOSIS — M1612 Unilateral primary osteoarthritis, left hip: Secondary | ICD-10-CM

## 2015-12-18 DIAGNOSIS — M25551 Pain in right hip: Secondary | ICD-10-CM | POA: Diagnosis present

## 2015-12-18 DIAGNOSIS — Z79899 Other long term (current) drug therapy: Secondary | ICD-10-CM | POA: Diagnosis not present

## 2015-12-18 HISTORY — PX: TOTAL HIP ARTHROPLASTY: SHX124

## 2015-12-18 SURGERY — ARTHROPLASTY, HIP, TOTAL, ANTERIOR APPROACH
Anesthesia: Spinal | Site: Hip | Laterality: Right

## 2015-12-18 MED ORDER — LACTATED RINGERS IV SOLN: INTRAVENOUS | Status: DC

## 2015-12-18 MED ORDER — SODIUM CHLORIDE 0.9 % IV SOLN: INTRAVENOUS | Status: DC

## 2015-12-18 MED ORDER — TRANEXAMIC ACID 1000 MG/10ML IV SOLN
2000.0000 mg | Freq: Once | INTRAVENOUS | Status: DC
  Filled 2015-12-18: qty 20

## 2015-12-18 MED ORDER — TRAMADOL HCL 50 MG PO TABS: 50.0000 mg | ORAL_TABLET | Freq: Four times a day (QID) | ORAL | Status: DC | PRN

## 2015-12-18 MED ORDER — FLEET ENEMA 7-19 GM/118ML RE ENEM: 1.0000 | ENEMA | Freq: Once | RECTAL | Status: DC | PRN

## 2015-12-18 MED ORDER — MIDAZOLAM HCL 2 MG/2ML IJ SOLN
INTRAMUSCULAR | Status: AC
  Filled 2015-12-18: qty 2

## 2015-12-18 MED ORDER — TRANEXAMIC ACID 1000 MG/10ML IV SOLN
2000.0000 mg | INTRAVENOUS | Status: DC | PRN
  Administered 2015-12-18: 2000 mg via TOPICAL

## 2015-12-18 MED ORDER — PROPOFOL 10 MG/ML IV BOLUS
INTRAVENOUS | Status: AC
  Filled 2015-12-18: qty 40

## 2015-12-18 MED ORDER — EPHEDRINE SULFATE 50 MG/ML IJ SOLN
INTRAMUSCULAR | Status: DC | PRN
  Administered 2015-12-18: 10 mg via INTRAVENOUS

## 2015-12-18 MED ORDER — PROPOFOL 10 MG/ML IV BOLUS
INTRAVENOUS | Status: AC
  Filled 2015-12-18: qty 20

## 2015-12-18 MED ORDER — DOCUSATE SODIUM 100 MG PO CAPS
100.0000 mg | ORAL_CAPSULE | Freq: Two times a day (BID) | ORAL | Status: DC
  Administered 2015-12-18 – 2015-12-19 (×2): 100 mg via ORAL

## 2015-12-18 MED ORDER — ZOLPIDEM TARTRATE 5 MG PO TABS: 5.0000 mg | ORAL_TABLET | Freq: Every evening | ORAL | Status: DC | PRN

## 2015-12-18 MED ORDER — METHOCARBAMOL 500 MG PO TABS
500.0000 mg | ORAL_TABLET | Freq: Four times a day (QID) | ORAL | Status: DC | PRN
  Administered 2015-12-18 – 2015-12-19 (×2): 500 mg via ORAL
  Filled 2015-12-18 (×2): qty 1

## 2015-12-18 MED ORDER — SUFENTANIL 5MCG/ML (10ML) SYRINGE FOR MEDFUSION PUMP - OPTIME
INTRAVENOUS | Status: DC | PRN
  Administered 2015-12-18 (×3): 5 ug via INTRAVENOUS

## 2015-12-18 MED ORDER — OXYCODONE HCL 5 MG PO TABS
5.0000 mg | ORAL_TABLET | ORAL | Status: DC | PRN
  Administered 2015-12-18 – 2015-12-19 (×5): 10 mg via ORAL
  Filled 2015-12-18 (×5): qty 2

## 2015-12-18 MED ORDER — MENTHOL 3 MG MT LOZG
1.0000 | LOZENGE | OROMUCOSAL | Status: DC | PRN
Start: 2015-12-18 — End: 2015-12-19

## 2015-12-18 MED ORDER — METOCLOPRAMIDE HCL 5 MG/ML IJ SOLN: 5.0000 mg | Freq: Three times a day (TID) | INTRAMUSCULAR | Status: DC | PRN

## 2015-12-18 MED ORDER — PROPOFOL 10 MG/ML IV BOLUS
INTRAVENOUS | Status: DC | PRN
  Administered 2015-12-18: 20 mg via INTRAVENOUS

## 2015-12-18 MED ORDER — ONDANSETRON HCL 4 MG/2ML IJ SOLN: 4.0000 mg | Freq: Four times a day (QID) | INTRAMUSCULAR | Status: DC | PRN

## 2015-12-18 MED ORDER — DEXAMETHASONE SODIUM PHOSPHATE 10 MG/ML IJ SOLN
10.0000 mg | Freq: Once | INTRAMUSCULAR | Status: AC
  Administered 2015-12-18: 10 mg via INTRAVENOUS

## 2015-12-18 MED ORDER — MORPHINE SULFATE (PF) 10 MG/ML IV SOLN
INTRAVENOUS | Status: AC
  Filled 2015-12-18: qty 1

## 2015-12-18 MED ORDER — PHENYLEPHRINE HCL 10 MG/ML IJ SOLN
INTRAMUSCULAR | Status: DC | PRN
  Administered 2015-12-18 (×11): 80 ug via INTRAVENOUS

## 2015-12-18 MED ORDER — RIVAROXABAN 10 MG PO TABS
10.0000 mg | ORAL_TABLET | Freq: Every day | ORAL | Status: DC
  Administered 2015-12-19: 10 mg via ORAL
  Filled 2015-12-18 (×2): qty 1

## 2015-12-18 MED ORDER — PROMETHAZINE HCL 25 MG/ML IJ SOLN
INTRAMUSCULAR | Status: AC
  Filled 2015-12-18: qty 1

## 2015-12-18 MED ORDER — CEFAZOLIN SODIUM-DEXTROSE 2-3 GM-% IV SOLR
INTRAVENOUS | Status: AC
  Filled 2015-12-18: qty 50

## 2015-12-18 MED ORDER — KETOROLAC TROMETHAMINE 30 MG/ML IJ SOLN: 30.0000 mg | Freq: Once | INTRAMUSCULAR | Status: DC

## 2015-12-18 MED ORDER — GABAPENTIN 300 MG PO CAPS
600.0000 mg | ORAL_CAPSULE | Freq: Two times a day (BID) | ORAL | Status: DC
  Administered 2015-12-18 – 2015-12-19 (×2): 600 mg via ORAL
  Filled 2015-12-18 (×3): qty 2

## 2015-12-18 MED ORDER — ONDANSETRON HCL 4 MG/2ML IJ SOLN
INTRAMUSCULAR | Status: AC
  Filled 2015-12-18: qty 2

## 2015-12-18 MED ORDER — PHENOL 1.4 % MT LIQD: 1.0000 | OROMUCOSAL | Status: DC | PRN

## 2015-12-18 MED ORDER — CLONAZEPAM 0.5 MG PO TABS: 0.5000 mg | ORAL_TABLET | Freq: Every evening | ORAL | Status: DC | PRN

## 2015-12-18 MED ORDER — MIDAZOLAM HCL 5 MG/5ML IJ SOLN
INTRAMUSCULAR | Status: DC | PRN
  Administered 2015-12-18 (×2): 0.5 mg via INTRAVENOUS
  Administered 2015-12-18: 2 mg via INTRAVENOUS
  Administered 2015-12-18: 1 mg via INTRAVENOUS

## 2015-12-18 MED ORDER — SUFENTANIL CITRATE 50 MCG/ML IV SOLN
INTRAVENOUS | Status: AC
  Filled 2015-12-18: qty 1

## 2015-12-18 MED ORDER — MORPHINE SULFATE (PF) 10 MG/ML IV SOLN
1.0000 mg | INTRAVENOUS | Status: DC | PRN
  Administered 2015-12-18: 2 mg via INTRAVENOUS

## 2015-12-18 MED ORDER — FLUTICASONE PROPIONATE 50 MCG/ACT NA SUSP
1.0000 | Freq: Every day | NASAL | Status: DC | PRN
  Filled 2015-12-18: qty 16

## 2015-12-18 MED ORDER — PROPOFOL 500 MG/50ML IV EMUL
INTRAVENOUS | Status: DC | PRN
  Administered 2015-12-18: 75 ug/kg/min via INTRAVENOUS

## 2015-12-18 MED ORDER — HYDROCODONE-ACETAMINOPHEN 7.5-325 MG PO TABS: 1.0000 | ORAL_TABLET | Freq: Once | ORAL | Status: DC | PRN

## 2015-12-18 MED ORDER — SODIUM CHLORIDE 0.9 % IJ SOLN
INTRAMUSCULAR | Status: AC
  Filled 2015-12-18: qty 10

## 2015-12-18 MED ORDER — CHLORHEXIDINE GLUCONATE 4 % EX LIQD: 60.0000 mL | Freq: Once | CUTANEOUS | Status: DC

## 2015-12-18 MED ORDER — DEXAMETHASONE SODIUM PHOSPHATE 10 MG/ML IJ SOLN
10.0000 mg | Freq: Once | INTRAMUSCULAR | Status: DC
  Filled 2015-12-18: qty 1

## 2015-12-18 MED ORDER — ACETAMINOPHEN 500 MG PO TABS
1000.0000 mg | ORAL_TABLET | Freq: Four times a day (QID) | ORAL | Status: DC
  Administered 2015-12-18 – 2015-12-19 (×3): 1000 mg via ORAL
  Filled 2015-12-18 (×4): qty 2

## 2015-12-18 MED ORDER — BISACODYL 10 MG RE SUPP: 10.0000 mg | Freq: Every day | RECTAL | Status: DC | PRN

## 2015-12-18 MED ORDER — BUPIVACAINE HCL (PF) 0.25 % IJ SOLN
INTRAMUSCULAR | Status: AC
  Filled 2015-12-18: qty 30

## 2015-12-18 MED ORDER — CEFAZOLIN SODIUM-DEXTROSE 2-4 GM/100ML-% IV SOLN
2.0000 g | Freq: Four times a day (QID) | INTRAVENOUS | Status: AC
  Administered 2015-12-18 – 2015-12-19 (×2): 2 g via INTRAVENOUS
  Filled 2015-12-18 (×2): qty 100

## 2015-12-18 MED ORDER — ACETAMINOPHEN 325 MG PO TABS: 650.0000 mg | ORAL_TABLET | Freq: Four times a day (QID) | ORAL | Status: DC | PRN

## 2015-12-18 MED ORDER — ONDANSETRON HCL 4 MG PO TABS: 4.0000 mg | ORAL_TABLET | Freq: Four times a day (QID) | ORAL | Status: DC | PRN

## 2015-12-18 MED ORDER — ONDANSETRON HCL 4 MG/2ML IJ SOLN
INTRAMUSCULAR | Status: DC | PRN
  Administered 2015-12-18: 4 mg via INTRAVENOUS

## 2015-12-18 MED ORDER — SODIUM CHLORIDE 0.9 % IV SOLN
INTRAVENOUS | Status: DC
  Administered 2015-12-18: 18:00:00 via INTRAVENOUS

## 2015-12-18 MED ORDER — PHENYLEPHRINE 40 MCG/ML (10ML) SYRINGE FOR IV PUSH (FOR BLOOD PRESSURE SUPPORT)
PREFILLED_SYRINGE | INTRAVENOUS | Status: AC
  Filled 2015-12-18: qty 20

## 2015-12-18 MED ORDER — LACTATED RINGERS IV SOLN
INTRAVENOUS | Status: DC | PRN
  Administered 2015-12-18 (×3): via INTRAVENOUS

## 2015-12-18 MED ORDER — MORPHINE SULFATE (PF) 2 MG/ML IV SOLN
1.0000 mg | INTRAVENOUS | Status: DC | PRN
  Administered 2015-12-18: 1 mg via INTRAVENOUS
  Filled 2015-12-18: qty 1

## 2015-12-18 MED ORDER — POLYETHYLENE GLYCOL 3350 17 G PO PACK: 17.0000 g | PACK | Freq: Every day | ORAL | Status: DC | PRN

## 2015-12-18 MED ORDER — PROMETHAZINE HCL 25 MG/ML IJ SOLN: 6.2500 mg | INTRAMUSCULAR | Status: DC | PRN

## 2015-12-18 MED ORDER — METHOCARBAMOL 1000 MG/10ML IJ SOLN
500.0000 mg | Freq: Four times a day (QID) | INTRAVENOUS | Status: DC | PRN
  Administered 2015-12-18: 500 mg via INTRAVENOUS
  Filled 2015-12-18 (×2): qty 5

## 2015-12-18 MED ORDER — DEXAMETHASONE SODIUM PHOSPHATE 10 MG/ML IJ SOLN
INTRAMUSCULAR | Status: AC
  Filled 2015-12-18: qty 1

## 2015-12-18 MED ORDER — METOCLOPRAMIDE HCL 10 MG PO TABS: 5.0000 mg | ORAL_TABLET | Freq: Three times a day (TID) | ORAL | Status: DC | PRN

## 2015-12-18 MED ORDER — LACTATED RINGERS IV SOLN
INTRAVENOUS | Status: DC
  Administered 2015-12-18: 1000 mL via INTRAVENOUS

## 2015-12-18 MED ORDER — ACETAMINOPHEN 10 MG/ML IV SOLN
INTRAVENOUS | Status: AC
  Filled 2015-12-18: qty 100

## 2015-12-18 MED ORDER — ACETAMINOPHEN 10 MG/ML IV SOLN
1000.0000 mg | Freq: Once | INTRAVENOUS | Status: AC
  Administered 2015-12-18: 1000 mg via INTRAVENOUS
  Filled 2015-12-18: qty 100

## 2015-12-18 MED ORDER — CEFAZOLIN SODIUM-DEXTROSE 2-4 GM/100ML-% IV SOLN
2.0000 g | INTRAVENOUS | Status: AC
  Administered 2015-12-18: 2 g via INTRAVENOUS
  Filled 2015-12-18: qty 100

## 2015-12-18 MED ORDER — ACETAMINOPHEN 650 MG RE SUPP: 650.0000 mg | Freq: Four times a day (QID) | RECTAL | Status: DC | PRN

## 2015-12-18 MED ORDER — DIPHENHYDRAMINE HCL 12.5 MG/5ML PO ELIX: 12.5000 mg | ORAL_SOLUTION | ORAL | Status: DC | PRN

## 2015-12-18 MED ORDER — BUPIVACAINE IN DEXTROSE 0.75-8.25 % IT SOLN
INTRATHECAL | Status: DC | PRN
  Administered 2015-12-18: 2 mL via INTRATHECAL

## 2015-12-18 MED ORDER — BUPIVACAINE HCL (PF) 0.25 % IJ SOLN
INTRAMUSCULAR | Status: DC | PRN
  Administered 2015-12-18: 20 mL

## 2015-12-18 SURGICAL SUPPLY — 34 items
BAG DECANTER FOR FLEXI CONT (MISCELLANEOUS) ×3 IMPLANT
BAG ZIPLOCK 12X15 (MISCELLANEOUS) IMPLANT
BLADE SAG 18X100X1.27 (BLADE) ×3 IMPLANT
CAPT HIP TOTAL 2 ×3 IMPLANT
CLOSURE WOUND 1/2 X4 (GAUZE/BANDAGES/DRESSINGS) ×1
CLOTH BEACON ORANGE TIMEOUT ST (SAFETY) ×3 IMPLANT
COVER PERINEAL POST (MISCELLANEOUS) ×3 IMPLANT
DECANTER SPIKE VIAL GLASS SM (MISCELLANEOUS) ×3 IMPLANT
DRAPE STERI IOBAN 125X83 (DRAPES) ×3 IMPLANT
DRAPE U-SHAPE 47X51 STRL (DRAPES) ×6 IMPLANT
DRSG ADAPTIC 3X8 NADH LF (GAUZE/BANDAGES/DRESSINGS) ×3 IMPLANT
DRSG MEPILEX BORDER 4X4 (GAUZE/BANDAGES/DRESSINGS) ×3 IMPLANT
DRSG MEPILEX BORDER 4X8 (GAUZE/BANDAGES/DRESSINGS) ×3 IMPLANT
DURAPREP 26ML APPLICATOR (WOUND CARE) ×3 IMPLANT
ELECT REM PT RETURN 9FT ADLT (ELECTROSURGICAL) ×3
ELECTRODE REM PT RTRN 9FT ADLT (ELECTROSURGICAL) ×1 IMPLANT
EVACUATOR 1/8 PVC DRAIN (DRAIN) ×3 IMPLANT
GLOVE BIO SURGEON STRL SZ7.5 (GLOVE) ×3 IMPLANT
GLOVE BIO SURGEON STRL SZ8 (GLOVE) ×6 IMPLANT
GLOVE BIOGEL PI IND STRL 8 (GLOVE) ×2 IMPLANT
GLOVE BIOGEL PI INDICATOR 8 (GLOVE) ×4
GOWN STRL REUS W/TWL LRG LVL3 (GOWN DISPOSABLE) ×3 IMPLANT
GOWN STRL REUS W/TWL XL LVL3 (GOWN DISPOSABLE) ×3 IMPLANT
PACK ANTERIOR HIP CUSTOM (KITS) ×3 IMPLANT
STRIP CLOSURE SKIN 1/2X4 (GAUZE/BANDAGES/DRESSINGS) ×2 IMPLANT
SUT ETHIBOND NAB CT1 #1 30IN (SUTURE) ×3 IMPLANT
SUT MNCRL AB 4-0 PS2 18 (SUTURE) ×3 IMPLANT
SUT VIC AB 2-0 CT1 27 (SUTURE) ×4
SUT VIC AB 2-0 CT1 TAPERPNT 27 (SUTURE) ×2 IMPLANT
SUT VLOC 180 0 24IN GS25 (SUTURE) ×6 IMPLANT
SYR 50ML LL SCALE MARK (SYRINGE) IMPLANT
TRAY FOLEY W/METER SILVER 14FR (SET/KITS/TRAYS/PACK) ×3 IMPLANT
TRAY FOLEY W/METER SILVER 16FR (SET/KITS/TRAYS/PACK) IMPLANT
YANKAUER SUCT BULB TIP 10FT TU (MISCELLANEOUS) ×3 IMPLANT

## 2015-12-18 NOTE — Anesthesia Preprocedure Evaluation (Addendum)
Anesthesia Evaluation  Patient identified by MRN, date of birth, ID band Patient awake    Reviewed: Allergy & Precautions, NPO status , Patient's Chart, lab work & pertinent test results  Airway Mallampati: II  TM Distance: >3 FB Neck ROM: Full    Dental  (+) Dental Advisory Given   Pulmonary former smoker,    breath sounds clear to auscultation       Cardiovascular negative cardio ROS   Rhythm:Regular Rate:Normal     Neuro/Psych Depression negative neurological ROS     GI/Hepatic negative GI ROS, Neg liver ROS,   Endo/Other  negative endocrine ROS  Renal/GU negative Renal ROS     Musculoskeletal  (+) Arthritis ,   Abdominal   Peds  Hematology negative hematology ROS (+)   Anesthesia Other Findings   Reproductive/Obstetrics                            Lab Results  Component Value Date   WBC 6.1 12/07/2015   HGB 13.7 12/07/2015   HCT 42.2 12/07/2015   MCV 93.4 12/07/2015   PLT 394 12/07/2015   Lab Results  Component Value Date   CREATININE 0.58 12/07/2015   BUN 12 12/07/2015   NA 139 12/07/2015   K 4.8 12/07/2015   CL 105 12/07/2015   CO2 24 12/07/2015   Lab Results  Component Value Date   INR 0.92 12/07/2015    Anesthesia Physical Anesthesia Plan  ASA: II  Anesthesia Plan: Spinal   Post-op Pain Management:    Induction:   Airway Management Planned: Natural Airway and Simple Face Mask  Additional Equipment:   Intra-op Plan:   Post-operative Plan:   Informed Consent: I have reviewed the patients History and Physical, chart, labs and discussed the procedure including the risks, benefits and alternatives for the proposed anesthesia with the patient or authorized representative who has indicated his/her understanding and acceptance.     Plan Discussed with: CRNA  Anesthesia Plan Comments:        Anesthesia Quick Evaluation

## 2015-12-18 NOTE — Anesthesia Postprocedure Evaluation (Signed)
Anesthesia Post Note  Patient: [name]  Procedure(s) Performed: Procedure(s) (LRB): RIGHT TOTAL HIP ARTHROPLASTY ANTERIOR APPROACH (Right)  Patient location during evaluation: PACU Anesthesia Type: Spinal and MAC Level of consciousness: awake and alert Pain management: pain level controlled Vital Signs Assessment: post-procedure vital signs reviewed and stable Respiratory status: spontaneous breathing and respiratory function stable Cardiovascular status: blood pressure returned to baseline and stable Postop Assessment: spinal receding Anesthetic complications: no    Last Vitals:  Filed Vitals:   12/18/15 1530 12/18/15 1537  BP: 132/73 154/83  Pulse: 66 65  Temp: 36.6 C 36.4 C  Resp: 12 15    Last Pain:  Filed Vitals:   12/18/15 1600  PainSc: 5                  Tiajuana Amass

## 2015-12-18 NOTE — Op Note (Signed)
OPERATIVE REPORT  PREOPERATIVE DIAGNOSIS: Osteoarthritis of the Right hip.   POSTOPERATIVE DIAGNOSIS: Osteoarthritis of the Right  hip.   PROCEDURE: Right total hip arthroplasty, anterior approach.   SURGEON: Gaynelle Arabian, MD   ASSISTANT: Arlee Muslim, PA-C  ANESTHESIA:  Spinal  ESTIMATED BLOOD LOSS:-400 ml    DRAINS: Hemovac x1.   COMPLICATIONS: None   CONDITION: PACU - hemodynamically stable.   BRIEF CLINICAL NOTE: Tara Murphy is a 70 y.o. female who has advanced end-  stage arthritis of her Right  hip with progressively worsening pain and  dysfunction.The patient has failed nonoperative management and presents for  total hip arthroplasty.   PROCEDURE IN DETAIL: After successful administration of spinal  anesthetic, the traction boots for the [facility] bed were placed on both  feet and the patient was placed onto the [facility] bed, boots placed into the leg  holders. The Right hip was then isolated from the perineum with plastic  drapes and prepped and draped in the usual sterile fashion. ASIS and  greater trochanter were marked and a oblique incision was made, starting  at about 1 cm lateral and 2 cm distal to the ASIS and coursing towards  the anterior cortex of the femur. The skin was cut with a 10 blade  through subcutaneous tissue to the level of the fascia overlying the  tensor fascia lata muscle. The fascia was then incised in line with the  incision at the junction of the anterior third and posterior 2/3rd. The  muscle was teased off the fascia and then the interval between the TFL  and the rectus was developed. The Hohmann retractor was then placed at  the top of the femoral neck over the capsule. The vessels overlying the  capsule were cauterized and the fat on top of the capsule was removed.  A Hohmann retractor was then placed anterior underneath the rectus  femoris to give exposure to the entire anterior capsule. A T-shaped  capsulotomy was performed.  The edges were tagged and the femoral head  was identified.       Osteophytes are removed off the superior acetabulum.  The femoral neck was then cut in situ with an oscillating saw. Traction  was then applied to the left lower extremity utilizing the [facility]  traction. The femoral head was then removed. Retractors were placed  around the acetabulum and then circumferential removal of the labrum was  performed. Osteophytes were also removed. Reaming starts at 43 mm to  medialize and  Increased in 2 mm increments to 47 mm. We reamed in  approximately 40 degrees of abduction, 20 degrees anteversion. A 48 mm  pinnacle acetabular shell was then impacted in anatomic position under  fluoroscopic guidance with excellent purchase. We did not need to place  any additional dome screws. A 28 mm neutral + 4 marathon liner was then  placed into the acetabular shell.       The femoral lift was then placed along the lateral aspect of the femur  just distal to the vastus ridge. The leg was  externally rotated and capsule  was stripped off the inferior aspect of the femoral neck down to the  level of the lesser trochanter, this was done with electrocautery. The femur was lifted after this was performed. The  leg was then placed and extended in adducted position to essentially delivering the femur. We also removed the capsule superiorly and the  piriformis from the piriformis  fossa to gain excellent exposure of the  proximal femur. Rongeur was used to remove some cancellous bone to get  into the lateral portion of the proximal femur for placement of the  initial starter reamer. The starter broaches was placed  the starter broach  and was shown to go down the center of the canal. Broaching  with the  Corail system was then performed starting at size 8, coursing  Up to size 11. A size 11 had excellent torsional and rotational  and axial stability. The trial high offset neck was then placed  with a 28 + 1.5 trial  head. The hip was then reduced. We confirmed that  the stem was in the canal both on AP and lateral x-rays. It also has excellent sizing. The hip was reduced with outstanding stability through full extension, full external rotation,  and then flexion in adduction internal rotation. AP pelvis was taken  and the leg lengths were measured and found to be exactly equal. Hip  was then dislocated again and the femoral head and neck removed. The  femoral broach was removed. Size 11 Corail stem with a high offset  neck was then impacted into the femur following native anteversion. It had an extremely tight fit and would not seed compltely. I subsequently removed the stem and reamed the canal to 10 mm. I then was able to impact the stem such that the collar was at the level of the femoral neck cut. The stem has  excellent purchase in the canal. Excellent torsional and rotational and  axial stability. It is confirmed to be in the canal on AP and lateral  fluoroscopic views. The 28 + 1.5 ceramic head was placed and the hip  reduced with outstanding stability. Again AP pelvis was taken and it  confirmed that the leg lengths were equal. The wound was then copiously  irrigated with saline solution and the capsule reattached and repaired  with Ethibond suture. 30 ml of .25% Bupivicaine injected into the capsule and into the edge of the tensor fascia lata as well as subcutaneous tissue. The fascia overlying the tensor fascia lata was  then closed with a running #1 V-Loc. Subcu was closed with interrupted  2-0 Vicryl and subcuticular running 4-0 Monocryl. Incision was cleaned  and dried. Steri-Strips and a bulky sterile dressing applied. Hemovac  drain was hooked to suction and then he was awakened and transported to  recovery in stable condition.        Please note that a surgical assistant was a medical necessity for this procedure to perform it in a safe and expeditious manner. Assistant was necessary to  provide appropriate retraction of vital neurovascular structures and to prevent femoral fracture and allow for anatomic placement of the prosthesis.  Gaynelle Arabian, M.D.

## 2015-12-18 NOTE — Progress Notes (Signed)
Portable AP Pelvis X-ray done. 

## 2015-12-18 NOTE — Anesthesia Procedure Notes (Addendum)
Spinal Patient location during procedure: OR Start time: 12/18/2015 12:20 PM End time: 12/18/2015 12:35 PM Staffing Anesthesiologist: Suzette Battiest Resident/CRNA: KEY, KRISTOPHER Performed by: anesthesiologist  Preanesthetic Checklist Completed: patient identified, site marked, surgical consent, pre-op evaluation, timeout performed, IV checked, risks and benefits discussed and monitors and equipment checked Spinal Block Patient position: sitting Prep: Betadine Patient monitoring: heart rate, cardiac monitor, continuous pulse ox and blood pressure Approach: right paramedian Location: L4-5 Injection technique: single-shot Needle Needle type: Quincke  Needle gauge: 22 G Needle length: 9 cm Needle insertion depth: 7 cm Assessment Sensory level: T6 Additional Notes -heme, -para, VSS.  Lot and expiration date verified.

## 2015-12-18 NOTE — Transfer of Care (Signed)
Immediate Anesthesia Transfer of Care Note  Patient: Tara Murphy  Procedure(s) Performed: Procedure(s): RIGHT TOTAL HIP ARTHROPLASTY ANTERIOR APPROACH (Right)  Patient Location: PACU  Anesthesia Type:Spinal  Level of Consciousness:  sedated, patient cooperative and responds to stimulation  Airway & Oxygen Therapy:Patient Spontanous Breathing and Patient connected to face mask oxgen  Post-op Assessment:  Report given to PACU RN and Post -op Vital signs reviewed and stable  Post vital signs:  Reviewed and stable  Last Vitals:  Filed Vitals:   12/18/15 0943  BP: 148/81  Pulse: 81  Temp: 36.6 C  Resp: 16    Complications: No apparent anesthesia complications

## 2015-12-18 NOTE — Interval H&P Note (Signed)
History and Physical Interval Note:  12/18/2015 11:23 AM  Tara Murphy  has presented today for surgery, with the diagnosis of right hip osteoarthrits  The various methods of treatment have been discussed with the patient and family. After consideration of risks, benefits and other options for treatment, the patient has consented to  Procedure(s): RIGHT TOTAL HIP ARTHROPLASTY ANTERIOR APPROACH (Right) as a surgical intervention .  The patient's history has been reviewed, patient examined, no change in status, stable for surgery.  I have reviewed the patient's chart and labs.  Questions were answered to the patient's satisfaction.     [name]

## 2015-12-18 NOTE — H&P (View-Only) (Signed)
Tara Murphy DOB: 06-28-1946 Married / Language: English / Race: White Female Date of Admission:  12/18/2015 CC:  Right Hip Pain History of Present Illness  The patient is a 70 year old female who comes in for a preoperative History and Physical. The patient is scheduled for a right total hip arthroplasty (anterior) to be performed by Dr. Dione Plover. [name], MD at [facility] on 12-18-2015. The patient is a 70 year old female who presented for follow up of their hip. The patient is being followed for their right hip pain. They are 2 year(s) out from when symptoms began. Symptoms reported today include: pain. The patient feels that they are doing poorly and report their pain level to be severe. The following medication has been used for pain control: Hydrocodone. The patient followed up on the MRI of their right hip. Unfortunately, her right hip is getting progressively worse. She has had problems on and off for a while but has gotten much worse in the past several months. Pain is in the groin and anterior thigh radiating to her knee. She is having increasing difficulty with activities. She has pain at rest and with ambulation. She has got what appears to be to the start of either rapidly progressive arthritis of the hip or an avascular necrosis with progression. 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 She had a posterior approach on that. She had done several years ago and did well with it. She said she started with a core decompression. She does not want to do anything short of a hip replacement if any surgery is going to be required. With the early collapse with the significant edema in the bone and large effusion, I would say at this point, the most predictable means of getting her better would be a total hip arthroplasty. We did discuss that in detail. She is very interested in doing that. They have been treated conservatively in the past for the above stated  problem and despite conservative measures, they continue to have progressive pain and severe functional limitations and dysfunction. They have failed non-operative management including home exercise, medications, and injections. It is felt that they would benefit from undergoing total joint replacement. Risks and benefits of the procedure have been discussed with the patient and they elect to proceed with surgery. There are no active contraindications to surgery such as ongoing infection or rapidly progressive neurological disease.  Problem List/Past Medical Lumbar DDD (M51.36)  Acute hip pain, right (M25.551)  Primary osteoarthritis of right hip (M16.11)  Breast Cancer  Left-sided Hypercholesterolemia  Hypothyroidism  Osteoarthritis  Menopause  Urinary Tract Infection  Allergies Sulfa Drugs  Rash. HYDROmorphone HCl *ANALGESICS - OPIOID*  Nausea. FentaNYL *ANALGESICS - OPIOID*  Flushing Terbinafine *DERMATOLOGICALS*  Iodinated Contrast  Rash, Hives.  Family History  Cancer  Father. Chronic Obstructive Lung Disease  Mother. Congestive Heart Failure  Mother. Drug / Alcohol Addiction  Father. First Degree Relatives  reported Heart disease in female family member before age 57  Osteoarthritis  Brother, Father, Mother, Sister. child  Social History Children  2 Current drinker  09/19/2015: Currently drinks wine 8-14 times per week Current work status  retired Furniture conservator/restorer weekly; does gym / [employer] Living situation  live with spouse Marital status  married No history of drug/alcohol rehab  Not under pain contract  Number of flights of stairs before winded  2-3 Tobacco / smoke exposure  09/19/2015: no Tobacco use  Former smoker. 09/19/2015: smoke(d) 3 more pack(s) per day [place] following the Right TH-AA to be performed on 12/17/2105.  Medication History Norco (5-'325MG'$  Tablet, 1-2 Tablet  Oral every 8 hours as needed for pain, Taken starting 11/23/2015) Active. ClonazePAM (0.'5MG'$  Tablet, Oral) Active. Fluticasone Propionate (Nasal) (50MCG/DOSE Inhaler, Nasal) Active. Gabapentin ('300MG'$  Tablet, Oral) Active. (Dr Regino Bellow)   Past Surgical History Appendectomy  Date: 2002. Breast Mass; Local Excision  Date: 09/2013. left Cataract Surgery  bilateral Colon Polyp Removal - Colonoscopy  Total Hip Replacement  Date: 2007. left Total Knee Replacement  Date: 2011. right Tubal Ligation    Review of Systems  General Not Present- Chills, Fatigue, Fever, Memory Loss, Night Sweats, Weight Gain and Weight Loss. Skin Not Present- Eczema, Hives, Itching, Lesions and Rash. HEENT Not Present- Dentures, Double Vision, Headache, Hearing Loss, Tinnitus and Visual Loss. Respiratory Not Present- Allergies, Chronic Cough, Coughing up blood, Shortness of breath at rest and Shortness of breath with exertion. Cardiovascular Not Present- Chest Pain, Difficulty Breathing Lying Down, Murmur, Palpitations, Racing/skipping heartbeats and Swelling. Gastrointestinal Not Present- Abdominal Pain, Bloody Stool, Constipation, Diarrhea, Difficulty Swallowing, Heartburn, Jaundice, Loss of appetitie, Nausea and Vomiting. Female Genitourinary Not Present- Blood in Urine, Discharge, Flank Pain, Incontinence, Painful Urination, Urgency, Urinary frequency, Urinary Retention, Urinating at Night and Weak urinary stream. Musculoskeletal Present- Back Pain, Joint Pain, Joint Swelling and Morning Stiffness. Not Present- Muscle Pain, Muscle Weakness and Spasms. Neurological Not Present- Blackout spells, Difficulty with balance, Dizziness, Paralysis, Tremor and Weakness. Psychiatric Not Present- Insomnia.  Vitals  Weight: 182 lb Height: 66in Body Surface Area: 1.92 m Body Mass Index: 29.38 kg/m  BP: 128/64 (Sitting, Right Arm, Standard)  Physical Exam General Mental Status -Alert, cooperative and good  historian. General Appearance-pleasant, Not in acute distress. Orientation-Oriented X3. Build & Nutrition-Well nourished and Well developed.  Head and Neck Head-normocephalic, atraumatic . Neck Global Assessment - supple, no bruit auscultated on the right, no bruit auscultated on the left.  Eye Pupil - Bilateral-Regular and Round. Motion - Bilateral-EOMI.  Chest and Lung Exam Auscultation Breath sounds - clear at anterior chest wall and clear at posterior chest wall. Adventitious sounds - No Adventitious sounds.  Cardiovascular Auscultation Rhythm - Regular rate and rhythm. Heart Sounds - S1 WNL and S2 WNL. Murmurs & Other Heart Sounds: Murmur 1 - Location - Aortic Area and Pulmonic Area. Timing - Early systolic. Grade - II/VI. Character - Low pitched.  Abdomen Palpation/Percussion Tenderness - Abdomen is non-tender to palpation. Rigidity (guarding) - Abdomen is soft. Auscultation Auscultation of the abdomen reveals - Bowel sounds normal.  Female Genitourinary Note: Not done, not pertinent to present illness   Musculoskeletal Note: She is alert and oriented, in no apparent distress. Her left hip can be flexed to about 120, rotated in 30, 30 out, abduct to 40 without pain. The left hip has been replaced. Right hip flexion is about 100. Minimal internal rotation about 10 to 15 of external rotation and 20 of abduction. Plain films do show degenerative change in her hip  RADIOGRAPHS MRI shows significant degenerative change with significant edema in the femoral head. Small area of collapse and a very large effusion.  Assessment & Plan Primary osteoarthritis of right hip (M16.11)  Note:Surgical Plans: Right Total Hip Replacement - Anterior Approach  Disposition: Home  PCP: Dr. Jonni Sanger  Topical TXA  Anesthesia Issues: None  Signed electronically by Ok Edwards, III PA-C

## 2015-12-18 NOTE — Progress Notes (Signed)
X-ray results noted 

## 2015-12-19 LAB — CBC
HEMATOCRIT: 31.6 % — AB (ref 36.0–46.0)
HEMOGLOBIN: 10.5 g/dL — AB (ref 12.0–15.0)
MCH: 30.1 pg (ref 26.0–34.0)
MCHC: 33.2 g/dL (ref 30.0–36.0)
MCV: 90.5 fL (ref 78.0–100.0)
Platelets: 274 10*3/uL (ref 150–400)
RBC: 3.49 MIL/uL — AB (ref 3.87–5.11)
RDW: 12.7 % (ref 11.5–15.5)
WBC: 10.9 10*3/uL — ABNORMAL HIGH (ref 4.0–10.5)

## 2015-12-19 LAB — BASIC METABOLIC PANEL
Anion gap: 8 (ref 5–15)
BUN: 10 mg/dL (ref 6–20)
CHLORIDE: 111 mmol/L (ref 101–111)
CO2: 24 mmol/L (ref 22–32)
Calcium: 8.8 mg/dL — ABNORMAL LOW (ref 8.9–10.3)
Creatinine, Ser: 0.57 mg/dL (ref 0.44–1.00)
GFR calc Af Amer: >60 mL/min (ref >60)
GFR calc non Af Amer: >60 mL/min (ref >60)
Glucose, Bld: 150 mg/dL — ABNORMAL HIGH (ref 65–99)
POTASSIUM: 4.7 mmol/L (ref 3.5–5.1)
SODIUM: 143 mmol/L (ref 135–145)

## 2015-12-19 MED ORDER — RIVAROXABAN 10 MG PO TABS: 10.0000 mg | ORAL_TABLET | Freq: Every day | ORAL | Status: DC

## 2015-12-19 MED ORDER — METHOCARBAMOL 500 MG PO TABS: 500.0000 mg | ORAL_TABLET | Freq: Four times a day (QID) | ORAL | Status: DC | PRN

## 2015-12-19 MED ORDER — OXYCODONE HCL 5 MG PO TABS: 5.0000 mg | ORAL_TABLET | ORAL | Status: DC | PRN

## 2015-12-19 MED ORDER — TRAMADOL HCL 50 MG PO TABS: 50.0000 mg | ORAL_TABLET | Freq: Four times a day (QID) | ORAL | Status: DC | PRN

## 2015-12-19 NOTE — Discharge Instructions (Addendum)
° °Dr. Frank [name] °Total Joint Specialist °[facility] °[address] °[place], [place] [address] °[phone] ° °ANTERIOR APPROACH TOTAL HIP REPLACEMENT POSTOPERATIVE DIRECTIONS ° ° °Hip Rehabilitation, Guidelines Following Surgery  °The results of a hip operation are greatly improved after range of motion and muscle strengthening exercises. Follow all safety measures which are given to protect your hip. If any of these exercises cause increased pain or swelling in your joint, decrease the amount until you are comfortable again. Then slowly increase the exercises. Call your caregiver if you have problems or questions.  ° °HOME CARE INSTRUCTIONS  °Remove items at home which could result in a fall. This includes throw rugs or furniture in walking pathways.  °· ICE to the affected hip every three hours for 30 minutes at a time and then as needed for pain and swelling.  Continue to use ice on the hip for pain and swelling from surgery. You may notice swelling that will progress down to the foot and ankle.  This is normal after surgery.  Elevate the leg when you are not up walking on it.   °· Continue to use the breathing machine which will help keep your temperature down.  It is common for your temperature to cycle up and down following surgery, especially at night when you are not up moving around and exerting yourself.  The breathing machine keeps your lungs expanded and your temperature down. ° ° °DIET °You may resume your previous home diet once your are discharged from the hospital. ° °DRESSING / WOUND CARE / SHOWERING °You may shower 3 days after surgery, but keep the wounds dry during showering.  You may use an occlusive plastic wrap (Press'n Seal for example), NO SOAKING/SUBMERGING IN THE BATHTUB.  If the bandage gets wet, change with a clean dry gauze.  If the incision gets wet, pat the wound dry with a clean towel. °You may start showering once you are discharged home but do not  submerge the incision under water. Just pat the incision dry and apply a dry gauze dressing on daily. °Change the surgical dressing daily and reapply a dry dressing each time. ° °ACTIVITY °Walk with your walker as instructed. °Use walker as long as suggested by your caregivers. °Avoid periods of inactivity such as sitting longer than an hour when not asleep. This helps prevent blood clots.  °You may resume a sexual relationship in one month or when given the OK by your doctor.  °You may return to work once you are cleared by your doctor.  °Do not drive a car for 6 weeks or until released by you surgeon.  °Do not drive while taking narcotics. ° °WEIGHT BEARING °Weight bearing as tolerated with assist device (walker, cane, etc) as directed, use it as long as suggested by your surgeon or therapist, typically at least 4-6 weeks. ° °POSTOPERATIVE CONSTIPATION PROTOCOL °Constipation - defined medically as fewer than three stools per week and severe constipation as less than one stool per week. ° °One of the most common issues patients have following surgery is constipation.  Even if you have a regular bowel pattern at home, your normal regimen is likely to be disrupted due to multiple reasons following surgery.  Combination of anesthesia, postoperative narcotics, change in appetite and fluid intake all can affect your bowels.  In order to avoid complications following surgery, here are some recommendations in order to help you during your recovery period. ° °Colace (docusate) - Pick up an over-the-counter   form of Colace or another stool softener and take twice a day as long as you are requiring postoperative pain medications.  Take with a full glass of water daily.  If you experience loose stools or diarrhea, hold the colace until you stool forms back up.  If your symptoms do not get better within 1 week or if they get worse, check with your doctor. ° °Dulcolax (bisacodyl) - Pick up over-the-counter and take as directed  by the product packaging as needed to assist with the movement of your bowels.  Take with a full glass of water.  Use this product as needed if not relieved by Colace only.  ° °MiraLax (polyethylene glycol) - Pick up over-the-counter to have on hand.  MiraLax is a solution that will increase the amount of water in your bowels to assist with bowel movements.  Take as directed and can mix with a glass of water, juice, soda, coffee, or tea.  Take if you go more than two days without a movement. °Do not use MiraLax more than once per day. Call your doctor if you are still constipated or irregular after using this medication for 7 days in a row. ° °If you continue to have problems with postoperative constipation, please contact the office for further assistance and recommendations.  If you experience "the worst abdominal pain ever" or develop nausea or vomiting, please contact the office immediatly for further recommendations for treatment. ° °ITCHING ° If you experience itching with your medications, try taking only a single pain pill, or even half a pain pill at a time.  You can also use Benadryl over the counter for itching or also to help with sleep.  ° °TED HOSE STOCKINGS °Wear the elastic stockings on both legs for three weeks following surgery during the day but you may remove then at night for sleeping. ° °MEDICATIONS °See your medication summary on the “After Visit Summary” that the nursing staff will review with you prior to discharge.  You may have some home medications which will be placed on hold until you complete the course of blood thinner medication.  It is important for you to complete the blood thinner medication as prescribed by your surgeon.  Continue your approved medications as instructed at time of discharge. ° °PRECAUTIONS °If you experience chest pain or shortness of breath - call 911 immediately for transfer to the hospital emergency department.  °If you develop a fever greater that 101 F,  purulent drainage from wound, increased redness or drainage from wound, foul odor from the wound/dressing, or calf pain - CONTACT YOUR SURGEON.   °                                                °FOLLOW-UP APPOINTMENTS °Make sure you keep all of your appointments after your operation with your surgeon and caregivers. You should call the office at the above phone number and make an appointment for approximately two weeks after the date of your surgery or on the date instructed by your surgeon outlined in the "After Visit Summary". ° °RANGE OF MOTION AND STRENGTHENING EXERCISES  °These exercises are designed to help you keep full movement of your hip joint. Follow your caregiver's or physical therapist's instructions. Perform all exercises about fifteen times, three times per day or as directed. Exercise both hips, even if you   have had only one joint replacement. These exercises can be done on a training (exercise) mat, on the floor, on a table or on a bed. Use whatever works the best and is most comfortable for you. Use music or television while you are exercising so that the exercises are a pleasant break in your day. This will make your life better with the exercises acting as a break in routine you can look forward to.  Lying on your back, slowly slide your foot toward your buttocks, raising your knee up off the floor. Then slowly slide your foot back down until your leg is straight again.  Lying on your back spread your legs as far apart as you can without causing discomfort.  Lying on your side, raise your upper leg and foot straight up from the floor as far as is comfortable. Slowly lower the leg and repeat.  Lying on your back, tighten up the muscle in the front of your thigh (quadriceps muscles). You can do this by keeping your leg straight and trying to raise your heel off the floor. This helps strengthen the largest muscle supporting your knee.  Lying on your back, tighten up the muscles of your  buttocks both with the legs straight and with the knee bent at a comfortable angle while keeping your heel on the floor.   IF YOU ARE TRANSFERRED TO A SKILLED REHAB FACILITY If the patient is transferred to a skilled rehab facility following release from the hospital, a list of the current medications will be sent to the facility for the patient to continue.  When discharged from the skilled rehab facility, please have the facility set up the patient's [place] prior to being released. Also, the skilled facility will be responsible for providing the patient with their medications at time of release from the facility to include their pain medication, the muscle relaxants, and their blood thinner medication. If the patient is still at the rehab facility at time of the two week follow up appointment, the skilled rehab facility will also need to assist the patient in arranging follow up appointment in our office and any transportation needs.  MAKE SURE YOU:  Understand these instructions.  Get help right away if you are not doing well or get worse.    Pick up stool softner and laxative for home use following surgery while on pain medications. Do not submerge incision under water. Please use good hand washing techniques while changing dressing each day. May shower starting three days after surgery. Please use a clean towel to pat the incision dry following showers. Continue to use ice for pain and swelling after surgery. Do not use any lotions or creams on the incision until instructed by your surgeon.  Take Xarelto for two and a half more weeks, then discontinue Xarelto. Once the patient has completed the blood thinner regimen, then take a Baby 81 mg Aspirin daily for three more weeks.   Information on my medicine - XARELTO (Rivaroxaban)  This medication education was reviewed with me or my healthcare representative as part of my discharge preparation.  The pharmacist that  spoke with me during my hospital stay was:  Hershal Coria, [facility]  Why was Xarelto prescribed for you? Xarelto was prescribed for you to reduce the risk of blood clots forming after orthopedic surgery. The medical term for these abnormal blood clots is venous thromboembolism (VTE).  What do you need to know about xarelto ? Take your Xarelto ONCE  DAILY at the same time every day. You may take it either with or without food.  If you have difficulty swallowing the tablet whole, you may crush it and mix in applesauce just prior to taking your dose.  Take Xarelto exactly as prescribed by your doctor and DO NOT stop taking Xarelto without talking to the doctor who prescribed the medication.  Stopping without other VTE prevention medication to take the place of Xarelto may increase your risk of developing a clot.  After discharge, you should have regular check-up appointments with your healthcare provider that is prescribing your Xarelto.    What do you do if you miss a dose? If you miss a dose, take it as soon as you remember on the same day then continue your regularly scheduled once daily regimen the next day. Do not take two doses of Xarelto on the same day.   Important Safety Information A possible side effect of Xarelto is bleeding. You should call your healthcare provider right away if you experience any of the following: ? Bleeding from an injury or your nose that does not stop. ? Unusual colored urine (red or dark brown) or unusual colored stools (red or black). ? Unusual bruising for unknown reasons. ? A serious fall or if you hit your head (even if there is no bleeding).  Some medicines may interact with Xarelto and might increase your risk of bleeding while on Xarelto. To help avoid this, consult your healthcare provider or pharmacist prior to using any new prescription or non-prescription medications, including herbals, vitamins, non-steroidal anti-inflammatory drugs (NSAIDs)  and supplements.  This website has more information on Xarelto: [email or web address]

## 2015-12-19 NOTE — Evaluation (Signed)
Occupational Therapy Evaluation Patient Details Name: Tara Murphy MRN: [id number] DOB: February 22, 1946 Today's Date: 12/19/2015    History of Present Illness s/p R THA, DA   Clinical Impression   This 70 year old female was admitted for the above surgery. All education was completed. No further OT is needed at this time    Follow Up Recommendations  No OT follow up    Equipment Recommendations  None recommended by OT    Recommendations for Other Services       Precautions / Restrictions Precautions Precautions: Fall Restrictions Weight Bearing Restrictions: No      Mobility Bed Mobility Overal bed mobility: Modified Independent             General bed mobility comments: HOB raised; pt used arms to help slide RLE across bed  Transfers Overall transfer level: Needs assistance Equipment used: Rolling walker (2 wheeled) Transfers: Sit to/from Stand Sit to Stand: Supervision         General transfer comment: cues one time for hand placement; also to extend leg, if desired for comfort    Balance                                            ADL Overall ADL's : Needs assistance/impaired     Grooming: Oral care;Set up;Standing                   Toilet Transfer: Supervision/safety;Ambulation;Grab bars;Comfort height toilet   Toileting- Clothing Manipulation and Hygiene: Set up;Sit to/from stand         General ADL Comments: pt verbalizes sequence for shower transfer; did not feel she needed to practice with this.  Husband has assisted with socks and shoes prior to this sx.  Pt is familiar with AE.     Vision     Perception     Praxis      Pertinent Vitals/Pain Pain Assessment: Faces Faces Pain Scale: Hurts a little bit Pain Location: R hip Pain Intervention(s): Limited activity within patient's tolerance;Monitored during session;Premedicated before session;Repositioned     Hand Dominance     Extremity/Trunk Assessment  Upper Extremity Assessment Upper Extremity Assessment: Overall WFL for tasks assessed           Communication Communication Communication: No difficulties   Cognition Arousal/Alertness: Awake/alert Behavior During Therapy: WFL for tasks assessed/performed Overall Cognitive Status: Within Functional Limits for tasks assessed                     General Comments       Exercises       Shoulder Instructions      Home Living Family/patient expects to be discharged to:: Private residence Living Arrangements: Spouse/significant other                 Bathroom Shower/Tub: Occupational psychologist: Handicapped height     Home Equipment: Shower seat;Grab bars - toilet          Prior Functioning/Environment Level of Independence: Needs assistance        Comments: husband assisted with socks/shoes    OT Diagnosis: Generalized weakness   OT Problem List:     OT Treatment/Interventions:      OT Goals(Current goals can be found in the care plan section) Acute Rehab OT Goals Patient Stated Goal: return to being independent OT  Goal Formulation: All assessment and education complete, DC therapy  OT Frequency:     Barriers to D/C:            Co-evaluation              End of Session    Activity Tolerance: Patient tolerated treatment well Patient left: in chair;with call bell/phone within reach;with nursing/sitter in room   Time: 0802-0822 OT Time Calculation (min): 20 min Charges:  OT General Charges $OT Visit: 1 Procedure OT Evaluation $OT Eval Low Complexity: 1 Procedure G-Codes:    Alvin Rubano 2015/12/25, 8:29 AM Lesle Chris, OTR/L [phone] 12-25-2015

## 2015-12-19 NOTE — Care Management Note (Signed)
Case Management Note  Patient Details  Name: Tara Murphy MRN: [id number] Date of Birth: April 29, 1946  Subjective/Objective:                  RIGHT TOTAL HIP ARTHROPLASTY ANTERIOR APPROACH (Right) Action/Plan: Discharge planning Expected Discharge Date:  12/19/15               Expected Discharge Plan:  [place]  In-House Referral:     Discharge planning Services  CM Consult  Post Acute Care Choice:    Choice offered to:  Patient  DME Arranged:  N/A DME Agency:  NA  HH Arranged:  PT HH Agency:  [facility]  Status of Service:  Completed, signed off  Medicare Important Message Given:    Date Medicare IM Given:    Medicare IM give by:    Date Additional Medicare IM Given:    Additional Medicare Important Message give by:     If discussed at [place] of Stay Meetings, dates discussed:    Additional Comments: Utilization Review complete.  CM met with pt in room to offer choice of home health agency.  Pt chooses [facility] to render HHPT.  Referral given to [facility] rep.  Pt has rolling walker at home and has an elevated commode with safety bar.  No other CM needs were communicated. Dellie Catholic, RN 12/19/2015, 10:18 AM

## 2015-12-19 NOTE — Progress Notes (Addendum)
   Subjective: 1 Day Post-Op Procedure(s) (LRB): RIGHT TOTAL HIP ARTHROPLASTY ANTERIOR APPROACH (Right) Patient reports pain as mild.   Patient seen in rounds with Dr. Wynelle Link.  Sitting up in bed. Patient is well, and has had no acute complaints or problems We will start therapy today.  If they do well with therapy and meets all goals, then will allow home later this afternoon following therapy. Plan is to go Home after hospital stay.  Objective: Vital signs in last 24 hours: Temp:  [97.5 F (36.4 C)-98.6 F (37 C)] 97.7 F (36.5 C) (03/28 0510) Pulse Rate:  [64-95] 65 (03/28 0510) Resp:  [12-18] 16 (03/28 0510) BP: (105-154)/(53-83) 106/53 mmHg (03/28 0510) SpO2:  [95 %-100 %] 100 % (03/28 0640) Weight:  [83.915 kg (185 lb)] 83.915 kg (185 lb) (03/27 1026)  Intake/Output from previous day:  Intake/Output Summary (Last 24 hours) at 12/19/15 0840 Last data filed at 12/19/15 0640  Gross per 24 hour  Intake   4995 ml  Output   4460 ml  Net    535 ml    Labs:  Recent Labs  12/19/15 0424  HGB 10.5*    Recent Labs  12/19/15 0424  WBC 10.9*  RBC 3.49*  HCT 31.6*  PLT 274    Recent Labs  12/19/15 0424  NA 143  K 4.7  CL 111  CO2 24  BUN 10  CREATININE 0.57  GLUCOSE 150*  CALCIUM 8.8*   No results for input(s): LABPT, INR in the last 72 hours.  EXAM General - Patient is Alert, Appropriate and Oriented Extremity - Neurovascular intact Sensation intact distally Dorsiflexion/Plantar flexion intact Dressing - dressing C/D/I Motor Function - intact, moving foot and toes well on exam.  Hemovac pulled without difficulty.  Past Medical History  Diagnosis Date  . Lichen plano-pilaris   . Urinary tract infection     hx of frequent   . Arthritis   . Breast cancer ([place])     left     Assessment/Plan: 1 Day Post-Op Procedure(s) (LRB): RIGHT TOTAL HIP ARTHROPLASTY ANTERIOR APPROACH (Right) Principal Problem:   OA (osteoarthritis) of hip Active Problems:   OA (osteoarthritis) of knee  Estimated body mass index is 29.87 kg/(m^2) as calculated from the following:   Height as of this encounter: '5\' 6"'$  (1.676 m).   Weight as of this encounter: 83.915 kg (185 lb). Advance diet Up with therapy Discharge home with home health  DVT Prophylaxis - Xarelto Weight-Bearing as tolerated to right leg D/C O2 and Pulse OX and try on Room Air  If meets goals and able to go home: Discharge home with home health Diet - Cardiac diet Follow up - in 2 weeks on Tuesday April 11th Activity - WBAT Disposition - Home Condition Upon Discharge - Pending D/C Meds - See DC Summary DVT Prophylaxis - [place], PA-C Orthopaedic Surgery

## 2015-12-19 NOTE — Evaluation (Signed)
Physical Therapy One Time Evaluation Patient Details Name: Channie Bostick MRN: [id number] DOB: 1946/09/15 Today's Date: 12/19/2015   History of Present Illness  Pt is a 70 year old female s/p R THA direct anterior approach  Clinical Impression  Patient evaluated by Physical Therapy with no further acute PT needs identified. All education has been completed and the patient has no further questions.  Pt tolerated ambulation in hallway well and performed a few LE exercises.  Pt plans to d/c home later today. See below for any follow-up Physical Therapy or equipment needs. PT is signing off. Thank you for this referral.     Follow Up Recommendations Home health PT    Equipment Recommendations  None recommended by PT    Recommendations for Other Services       Precautions / Restrictions Precautions Precautions: Fall;None Restrictions Other Position/Activity Restrictions: WBAT      Mobility  Bed Mobility Overal bed mobility: Modified Independent             General bed mobility comments: HOB raised; pt used arms to help slide RLE across bed  Transfers Overall transfer level: Needs assistance Equipment used: Rolling walker (2 wheeled) Transfers: Sit to/from Stand Sit to Stand: Supervision         General transfer comment: verbal cues for safe technique upon return to bed  Ambulation/Gait Ambulation/Gait assistance: Min guard;Supervision Ambulation Distance (Feet): 160 Feet Assistive device: Rolling walker (2 wheeled) Gait Pattern/deviations: Antalgic;Step-to pattern;Decreased stance time - right     General Gait Details: verbal cues for sequence and RW distance, attempted step through however returned to step to pattern  Stairs            Wheelchair Mobility    Modified Rankin (Stroke Patients Only)       Balance                                             Pertinent Vitals/Pain Pain Assessment: 0-10 Pain Score: 4  Pain  Location: R hip Pain Descriptors / Indicators: Sore;Aching Pain Intervention(s): Limited activity within patient's tolerance;Monitored during session;Ice applied    Home Living Family/patient expects to be discharged to:: Private residence Living Arrangements: Spouse/significant other   Type of Home: House Home Access: Level entry     Home Layout: One level Home Equipment: Shower seat;Grab bars - toilet;Walker - 2 wheels      Prior Function Level of Independence: Needs assistance   Gait / Transfers Assistance Needed: independent     Comments: husband assisted with socks/shoes     Hand Dominance        Extremity/Trunk Assessment               Lower Extremity Assessment: RLE deficits/detail RLE Deficits / Details: grossly 2+/5 throughout hip       Communication   Communication: No difficulties  Cognition Arousal/Alertness: Awake/alert Behavior During Therapy: WFL for tasks assessed/performed Overall Cognitive Status: Within Functional Limits for tasks assessed                      General Comments      Exercises Total Joint Exercises Ankle Circles/Pumps: AROM;10 reps;Both;Supine Quad Sets: AROM;Right;10 reps;Supine Heel Slides: AROM;Right;10 reps;Supine Hip ABduction/ADduction: Standing;AROM;Right;10 reps Long Arc Quad: AROM;Seated;10 reps;Right Knee Flexion: Standing;AROM;10 reps;Right Standing Hip Extension: AROM;Right;10 reps      Assessment/Plan  PT Assessment All further PT needs can be met in the next venue of care  PT Diagnosis Difficulty walking;Acute pain   PT Problem List Decreased strength;Decreased mobility;Pain  PT Treatment Interventions     PT Goals (Current goals can be found in the Care Plan section) Acute Rehab PT Goals PT Goal Formulation: All assessment and education complete, DC therapy    Frequency     Barriers to discharge        Co-evaluation               End of Session   Activity Tolerance:  Patient tolerated treatment well Patient left: in bed;with call bell/phone within reach;with family/visitor present Nurse Communication: Mobility status         Time: 1035-1055 PT Time Calculation (min) (ACUTE ONLY): 20 min   Charges:   PT Evaluation $PT Eval Low Complexity: 1 Procedure     PT G Codes:        Lamarcus Spira,KATHrine E 12/19/2015, 12:21 PM Carmelia Bake, PT, DPT 12/19/2015 Pager: [phone]

## 2015-12-19 NOTE — Discharge Summary (Signed)
Physician Discharge Summary   Patient ID: Tara Murphy MRN: [id number] DOB/AGE: [birth year] 70 y.o.  Admit date: 12/18/2015 Discharge date: 12-19-2015  Primary Diagnosis:  Osteoarthritis of the Right hip.   Admission Diagnoses:  Past Medical History  Diagnosis Date  . Lichen plano-pilaris   . Urinary tract infection     hx of frequent   . Arthritis   . Breast cancer ([place])     left    Discharge Diagnoses:   Principal Problem:   OA (osteoarthritis) of hip Active Problems:   OA (osteoarthritis) of knee  Estimated body mass index is 29.87 kg/(m^2) as calculated from the following:   Height as of this encounter: 5' 6" (1.676 m).   Weight as of this encounter: 83.915 kg (185 lb).  Procedure(s) (LRB): RIGHT TOTAL HIP ARTHROPLASTY ANTERIOR APPROACH (Right)   Consults: None  HPI: Tara Murphy is a 70 y.o. female who has advanced end-  stage arthritis of her Right hip with progressively worsening pain and  dysfunction.The patient has failed nonoperative management and presents for  total hip arthroplasty.   Laboratory Data: Admission on 12/18/2015  Component Date Value Ref Range Status  . WBC 12/19/2015 10.9* 4.0 - 10.5 K/uL Final  . RBC 12/19/2015 3.49* 3.87 - 5.11 MIL/uL Final  . Hemoglobin 12/19/2015 10.5* 12.0 - 15.0 g/dL Final  . HCT 12/19/2015 31.6* 36.0 - 46.0 % Final  . MCV 12/19/2015 90.5  78.0 - 100.0 fL Final  . MCH 12/19/2015 30.1  26.0 - 34.0 pg Final  . MCHC 12/19/2015 33.2  30.0 - 36.0 g/dL Final  . RDW 12/19/2015 12.7  11.5 - 15.5 % Final  . Platelets 12/19/2015 274  150 - 400 K/uL Final  . Sodium 12/19/2015 143  135 - 145 mmol/L Final  . Potassium 12/19/2015 4.7  3.5 - 5.1 mmol/L Final  . Chloride 12/19/2015 111  101 - 111 mmol/L Final  . CO2 12/19/2015 24  22 - 32 mmol/L Final  . Glucose, Bld 12/19/2015 150* 65 - 99 mg/dL Final  . BUN 12/19/2015 10  6 - 20 mg/dL Final  . Creatinine, Ser 12/19/2015 0.57  0.44 - 1.00 mg/dL Final  . Calcium  12/19/2015 8.8* 8.9 - 10.3 mg/dL Final  . GFR calc non Af Amer 12/19/2015 >60  >60 mL/min Final  . GFR calc Af Amer 12/19/2015 >60  >60 mL/min Final   Comment: (NOTE) The eGFR has been calculated using the CKD EPI equation. This calculation has not been validated in all clinical situations. eGFR's persistently <60 mL/min signify possible Chronic Kidney Disease.   Georgiann Hahn gap 12/19/2015 8  5 - 15 Final  Hospital Outpatient Visit on 12/07/2015  Component Date Value Ref Range Status  . aPTT 12/07/2015 30  24 - 37 seconds Final  . WBC 12/07/2015 6.1  4.0 - 10.5 K/uL Final  . RBC 12/07/2015 4.52  3.87 - 5.11 MIL/uL Final  . Hemoglobin 12/07/2015 13.7  12.0 - 15.0 g/dL Final  . HCT 12/07/2015 42.2  36.0 - 46.0 % Final  . MCV 12/07/2015 93.4  78.0 - 100.0 fL Final  . MCH 12/07/2015 30.3  26.0 - 34.0 pg Final  . MCHC 12/07/2015 32.5  30.0 - 36.0 g/dL Final  . RDW 12/07/2015 12.9  11.5 - 15.5 % Final  . Platelets 12/07/2015 394  150 - 400 K/uL Final  . Sodium 12/07/2015 139  135 - 145 mmol/L Final  . Potassium 12/07/2015 4.8  3.5 - 5.1 mmol/L Final  .  Chloride 12/07/2015 105  101 - 111 mmol/L Final  . CO2 12/07/2015 24  22 - 32 mmol/L Final  . Glucose, Bld 12/07/2015 92  65 - 99 mg/dL Final  . BUN 12/07/2015 12  6 - 20 mg/dL Final  . Creatinine, Ser 12/07/2015 0.58  0.44 - 1.00 mg/dL Final  . Calcium 12/07/2015 9.5  8.9 - 10.3 mg/dL Final  . Total Protein 12/07/2015 7.6  6.5 - 8.1 g/dL Final  . Albumin 12/07/2015 4.4  3.5 - 5.0 g/dL Final  . AST 12/07/2015 21  15 - 41 U/L Final  . ALT 12/07/2015 21  14 - 54 U/L Final  . Alkaline Phosphatase 12/07/2015 89  38 - 126 U/L Final  . Total Bilirubin 12/07/2015 0.5  0.3 - 1.2 mg/dL Final  . GFR calc non Af Amer 12/07/2015 >60  >60 mL/min Final  . GFR calc Af Amer 12/07/2015 >60  >60 mL/min Final   Comment: (NOTE) The eGFR has been calculated using the CKD EPI equation. This calculation has not been validated in all clinical situations. eGFR's  persistently <60 mL/min signify possible Chronic Kidney Disease.   . Anion gap 12/07/2015 10  5 - 15 Final  . Prothrombin Time 12/07/2015 12.6  11.6 - 15.2 seconds Final  . INR 12/07/2015 0.92  0.00 - 1.49 Final  . ABO/RH(D) 12/07/2015 O POS   Final  . Antibody Screen 12/07/2015 NEG   Final  . Sample Expiration 12/07/2015 12/21/2015   Final  . Extend sample reason 12/07/2015 NO TRANSFUSIONS OR PREGNANCY IN THE PAST 3 MONTHS   Final  . Color, Urine 12/07/2015 YELLOW  YELLOW Final  . APPearance 12/07/2015 CLOUDY* CLEAR Final  . Specific Gravity, Urine 12/07/2015 1.010  1.005 - 1.030 Final  . pH 12/07/2015 5.5  5.0 - 8.0 Final  . Glucose, UA 12/07/2015 NEGATIVE  NEGATIVE mg/dL Final  . Hgb urine dipstick 12/07/2015 NEGATIVE  NEGATIVE Final  . Bilirubin Urine 12/07/2015 NEGATIVE  NEGATIVE Final  . Ketones, ur 12/07/2015 NEGATIVE  NEGATIVE mg/dL Final  . Protein, ur 12/07/2015 NEGATIVE  NEGATIVE mg/dL Final  . Nitrite 12/07/2015 NEGATIVE  NEGATIVE Final  . Leukocytes, UA 12/07/2015 TRACE* NEGATIVE Final  . MRSA, PCR 12/07/2015 NEGATIVE  NEGATIVE Final  . Staphylococcus aureus 12/07/2015 NEGATIVE  NEGATIVE Final   Comment:        The Xpert SA Assay (FDA approved for NASAL specimens in patients over 32 years of age), is one component of a comprehensive surveillance program.  Test performance has been validated by [facility] for patients greater than or equal to 13 year old. It is not intended to diagnose infection nor to guide or monitor treatment.   . ABO/RH(D) 12/07/2015 O POS   Final  . Squamous Epithelial / LPF 12/07/2015 0-5* NONE SEEN Final  . WBC, UA 12/07/2015 6-30  0 - 5 WBC/hpf Final  . RBC / HPF 12/07/2015 NONE SEEN  0 - 5 RBC/hpf Final  . Bacteria, UA 12/07/2015 FEW* NONE SEEN Final     X-Rays:Dg Pelvis Portable  12/18/2015  CLINICAL DATA:  Status post right total hip replacement EXAM: DG C-ARM 1-60 MIN-NO REPORT; PORTABLE PELVIS 1-2 VIEWS COMPARISON:  Pelvis MRI  October 07, 2015 FINDINGS: Frontal view obtained. There are total hip prostheses bilaterally with prosthetic components bilaterally appearing well-seated. No fracture or dislocation. A surgical drain is present on the right. IMPRESSION: There are now total hip prostheses bilaterally with prosthetic components bilaterally appearing well-seated. No acute fracture or dislocation.  Electronically Signed   By: Lowella Grip III M.D.   On: 12/18/2015 14:57   Dg C-arm 1-60 Min-no Report  12/18/2015  CLINICAL DATA: surgery C-ARM 1-60 MINUTES Fluoroscopy was utilized by the requesting physician.  No radiographic interpretation.    EKG: Orders placed or performed in visit on 03/22/15  . EKG 12-Lead     Hospital Course: Patient was admitted to [facility] and taken to the OR and underwent the above state procedure without complications.  Patient tolerated the procedure well and was later transferred to the recovery room and then to the orthopaedic floor for postoperative care.  They were given PO and IV analgesics for pain control following their surgery.  They were given 24 hours of postoperative antibiotics of  Anti-infectives    Start     Dose/Rate Route Frequency Ordered Stop   12/18/15 1800  ceFAZolin (ANCEF) IVPB 2g/100 mL premix     2 g 200 mL/hr over 30 Minutes Intravenous Every 6 hours 12/18/15 1543 12/19/15 0053   12/18/15 1030  ceFAZolin (ANCEF) IVPB 2g/100 mL premix     2 g 200 mL/hr over 30 Minutes Intravenous On call to O.R. 12/18/15 1008 12/18/15 1246     and started on DVT prophylaxis in the form of Xarelto.   PT and OT were ordered for total hip protocol.  The patient was allowed to be WBAT with therapy. Discharge planning was consulted to help with postop disposition and equipment needs.  Patient had a good night on the evening of surgery.  They started to get up OOB with therapy on day one.  Hemovac drain was pulled without difficulty. Dressing was checked and was clean and  dry. Patient was seen in rounds and was ready to go home later that same day.  Discharge home with home health Diet - Cardiac diet Follow up - in 2 weeks on Tuesday April 11th Activity - WBAT Disposition - Home Condition Upon Discharge - improving D/C Meds - See DC Summary DVT Prophylaxis - Xarelto  Discharge Instructions    Call MD / Call 911    Complete by:  As directed   If you experience chest pain or shortness of breath, CALL 911 and be transported to the hospital emergency room.  If you develope a fever above 101 F, pus (white drainage) or increased drainage or redness at the wound, or calf pain, call your surgeon's office.     Change dressing    Complete by:  As directed   You may change your dressing dressing daily with sterile 4 x 4 inch gauze dressing and paper tape.  Do not submerge the incision under water.     Constipation Prevention    Complete by:  As directed   Drink plenty of fluids.  Prune juice may be helpful.  You may use a stool softener, such as Colace (over the counter) 100 mg twice a day.  Use MiraLax (over the counter) for constipation as needed.     Diet - low sodium heart healthy    Complete by:  As directed      Discharge instructions    Complete by:  As directed   Pick up stool softner and laxative for home use following surgery while on pain medications. Do not submerge incision under water. May remove the surgical dressing tomorrow, Wednesday 3/29, and then apply a dry gauze dressing daily. Please use good hand washing techniques while changing dressing each day. May shower starting three days  after surgery starting Thursday 3/30. Please use a clean towel to pat the incision dry following showers. Continue to use ice for pain and swelling after surgery. Do not use any lotions or creams on the incision until instructed by your surgeon.  Postoperative Constipation Protocol  Constipation - defined medically as fewer than three stools per week and severe  constipation as less than one stool per week.  One of the most common issues patients have following surgery is constipation. Even if you have a regular bowel pattern at home, your normal regimen is likely to be disrupted due to multiple reasons following surgery. Combination of anesthesia, postoperative narcotics, change in appetite and fluid intake all can affect your bowels. In order to avoid complications following surgery, here are some recommendations in order to help you during your recovery period.  Colace (docusate) - Pick up an over-the-counter form of Colace or another stool softener and take twice a day as long as you are requiring postoperative pain medications. Take with a full glass of water daily. If you experience loose stools or diarrhea, hold the colace until you stool forms back up. If your symptoms do not get better within 1 week or if they get worse, check with your doctor.  Dulcolax (bisacodyl) - Pick up over-the-counter and take as directed by the product packaging as needed to assist with the movement of your bowels. Take with a full glass of water. Use this product as needed if not relieved by Colace only.   MiraLax (polyethylene glycol) - Pick up over-the-counter to have on hand. MiraLax is a solution that will increase the amount of water in your bowels to assist with bowel movements. Take as directed and can mix with a glass of water, juice, soda, coffee, or tea. Take if you go more than two days without a movement. Do not use MiraLax more than once per day. Call your doctor if you are still constipated or irregular after using this medication for 7 days in a row.  If you continue to have problems with postoperative constipation, please contact the office for further assistance and recommendations. If you experience "the worst abdominal pain ever" or develop nausea or vomiting, please contact the office immediatly for further recommendations for treatment.  Take  Xarelto for two and a half more weeks, then discontinue Xarelto. Once the patient has completed the blood thinner regimen, then take a Baby 81 mg Aspirin daily for three more weeks.     Do not sit on low chairs, stoools or toilet seats, as it may be difficult to get up from low surfaces    Complete by:  As directed      Driving restrictions    Complete by:  As directed   No driving until released by the physician.     Increase activity slowly as tolerated    Complete by:  As directed      Lifting restrictions    Complete by:  As directed   No lifting until released by the physician.     Patient may shower    Complete by:  As directed   You may shower without a dressing once there is no drainage.  Do not wash over the wound.  If drainage remains, do not shower until drainage stops.     TED hose    Complete by:  As directed   Use stockings (TED hose) for 3 weeks on both leg(s).  You may remove them at night for sleeping.  Weight bearing as tolerated    Complete by:  As directed   Laterality:  left  Extremity:  Lower            Medication List    STOP taking these medications        HYDROcodone-acetaminophen 5-325 MG tablet  Commonly known as:  NORCO/VICODIN     tamoxifen 20 MG tablet  Commonly known as:  NOLVADEX      TAKE these medications        acetaminophen 500 MG tablet  Commonly known as:  TYLENOL  Take 1,000 mg by mouth daily.     clobetasol 0.05 % external solution  Commonly known as:  TEMOVATE  Apply 1 application topically 2 (two) times daily as needed (for scalp inflamation). To scalp     clonazePAM 0.5 MG tablet  Commonly known as:  KLONOPIN  Take 1 tablet by mouth daily as needed. For sleep     fluticasone 50 MCG/ACT nasal spray  Commonly known as:  FLONASE  Place 1 spray into the nose daily as needed for allergies or rhinitis.     gabapentin 300 MG capsule  Commonly known as:  NEURONTIN  Take 600 mg by mouth 2 (two) times daily.      methocarbamol 500 MG tablet  Commonly known as:  ROBAXIN  Take 1 tablet (500 mg total) by mouth every 6 (six) hours as needed for muscle spasms.     oxyCODONE 5 MG immediate release tablet  Commonly known as:  Oxy IR/ROXICODONE  Take 1-2 tablets (5-10 mg total) by mouth every 3 (three) hours as needed for moderate pain or severe pain.     rivaroxaban 10 MG Tabs tablet  Commonly known as:  XARELTO  Take 1 tablet (10 mg total) by mouth daily with breakfast. Take Xarelto for two and a half more weeks, then discontinue Xarelto. Once the patient has completed the blood thinner regimen, then take a Baby 81 mg Aspirin daily for three more weeks.     traMADol 50 MG tablet  Commonly known as:  ULTRAM  Take 1-2 tablets (50-100 mg total) by mouth every 6 (six) hours as needed (mild pain).     zolpidem 5 MG tablet  Commonly known as:  AMBIEN  Take 5 mg by mouth at bedtime as needed for sleep.           Follow-up Information    Follow up with [name], MD. Schedule an appointment as soon as possible for a visit on 01/02/2016.   Specialty:  Orthopedic Surgery   Why:  Call office at [phone] to setup appt on Tuesday 01/02/2016 with Dr. Wynelle Link.   Contact information:   [address] [place] [address] [phone]       Signed: Arlee Muslim, PA-C Orthopaedic Surgery 12/19/2015, 8:45 AM

## 2016-05-03 ENCOUNTER — Other Ambulatory Visit: Payer: Self-pay | Admitting: Hematology and Oncology

## 2016-05-03 DIAGNOSIS — Z853 Personal history of malignant neoplasm of breast: Secondary | ICD-10-CM

## 2016-05-10 ENCOUNTER — Ambulatory Visit
Admission: RE | Admit: 2016-05-10 | Discharge: 2016-05-10 | Disposition: A | Discharge status: Home / Self Care | Payer: Medicare Other | Source: Ambulatory Visit | Attending: Hematology and Oncology | Admitting: Hematology and Oncology

## 2016-05-10 DIAGNOSIS — Z853 Personal history of malignant neoplasm of breast: Secondary | ICD-10-CM

## 2016-09-04 NOTE — Assessment & Plan Note (Signed)
Breast cancer of lower-outer quadrant of left female breast ([place]) Left breast invasive ductal carcinoma diagnosed and treated in [place] diagnosed October 2014 status post lumpectomy January 2015 1.1 cm IDC grade 1 T1 cN0 M0 stage IA, 0/2 lymph nodes, ER/PR positive HER-2 negative status post MammoSite radiation started anastrozole 1 mg daily March 2015, switched to tamoxifen 06/20/2015 discontinued 08/29/2015  Anastrozole toxicities: Severe emotional problems requiring Zoloft, Ambien, clonazepam which she attributes partially to the medication as well as to her mother passing away last year along with moving to [place]. Skin dryness and Lack of feeling of wellness  Tamoxifen Toxicities: she tolerated tamoxifen better but she had episodes of major depression and hence she discontinued it after being on it for 3 months.  Return to clinic in 1 year for follow-up with survivorship

## 2016-09-05 ENCOUNTER — Ambulatory Visit (HOSPITAL_BASED_OUTPATIENT_CLINIC_OR_DEPARTMENT_OTHER): Payer: Medicare Other | Admitting: Hematology and Oncology

## 2016-09-05 ENCOUNTER — Encounter: Payer: Self-pay | Admitting: Hematology and Oncology

## 2016-09-05 DIAGNOSIS — Z170 Estrogen receptor positive status [ER+]: Principal | ICD-10-CM

## 2016-09-05 DIAGNOSIS — Z853 Personal history of malignant neoplasm of breast: Secondary | ICD-10-CM | POA: Diagnosis present

## 2016-09-05 DIAGNOSIS — C50512 Malignant neoplasm of lower-outer quadrant of left female breast: Secondary | ICD-10-CM

## 2016-09-05 NOTE — Progress Notes (Signed)
Patient Care Team: Leamon Arnt, MD as PCP - General (Family Medicine)  DIAGNOSIS:  Encounter Diagnosis  Name Primary?  . 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 breast ([name])   10/15/2013 Surgery    Left breast lumpectomy: Well-differentiated IDC grade 1, 1.1 cm with intermediate grade DCIS solid and cribriform, margins negative, no LVID, 0/2 sentinel nodes negative, T1 cN0 M0 stage IA, Oncotype DX score 8, (6% ROR) ER/PR positive HER-2 negative      11/15/2013 - 11/16/2013 Radiation Therapy    MammoSite partial breast radiation      12/17/2013 - 08/29/2015 Anti-estrogen oral therapy    Anastrozole 1 mg daily (severe fatigue, emotional changes, skin dryness), switched to tamoxifen 06/20/2015 (stopped due to depression)       CHIEF COMPLIANT: Surveillance of breast cancer  INTERVAL HISTORY: Tara Murphy is a 70 year old with above-mentioned history of invasive ductal carcinoma of the left breast treated with lumpectomy and MammoSite radiation and is tried antiestrogen therapy and could not tolerate it. She is currently on surveillance. She denies any problems or concerns. She had a right hip replacement and is planning to get a left knee replacement surgery. Because of these knee pain she is not able to enjoy her life.  REVIEW OF SYSTEMS:   Constitutional: Denies fevers, chills or abnormal weight loss Eyes: Denies blurriness of vision Ears, nose, mouth, throat, and face: Denies mucositis or sore throat Respiratory: Denies cough, dyspnea or wheezes Cardiovascular: Denies palpitation, chest discomfort Gastrointestinal:  Denies nausea, heartburn or change in bowel habits Skin: Denies abnormal skin rashes Lymphatics: Denies new lymphadenopathy or easy bruising Neurological:Denies numbness, tingling or new  weaknesses Behavioral/Psych: Mood is stable, no new changes  Extremities: No lower extremity edema Breast:  denies any pain or lumps or nodules in either breasts All other systems were reviewed with the patient and are negative.  I have reviewed the past medical history, past surgical history, social history and family history with the patient and they are unchanged from previous note.  ALLERGIES:  is allergic to contrast media [iodinated diagnostic agents]; hydromorphone hcl; sulfamethoxazole-trimethoprim; fentanyl; and terbinafine and related.  MEDICATIONS:  Current Outpatient Prescriptions  Medication Sig Dispense Refill  . acetaminophen (TYLENOL) 500 MG tablet Take 1,000 mg by mouth daily.    . clobetasol (TEMOVATE) 0.05 % external solution Apply 1 application topically 2 (two) times daily as needed (for scalp inflamation). To scalp    . clonazePAM (KLONOPIN) 0.5 MG tablet Take 1 tablet by mouth daily as needed. For sleep    . fluticasone (FLONASE) 50 MCG/ACT nasal spray Place 1 spray into the nose daily as needed for allergies or rhinitis.     Marland Kitchen gabapentin (NEURONTIN) 300 MG capsule Take 600 mg by mouth 2 (two) times daily.     . methocarbamol (ROBAXIN) 500 MG tablet Take 1 tablet (500 mg total) by mouth every 6 (six) hours as needed for muscle spasms. 90 tablet 0  . oxyCODONE (OXY IR/ROXICODONE) 5 MG immediate release tablet Take 1-2 tablets (5-10 mg total) by mouth every 3 (three) hours as needed for moderate pain or severe pain. 90 tablet 0  . rivaroxaban (XARELTO) 10 MG TABS tablet Take 1 tablet (10 mg total) by mouth daily with breakfast. Take Xarelto for two and a half more weeks, then discontinue Xarelto. Once the patient has completed  the blood thinner regimen, then take a Baby 81 mg Aspirin daily for three more weeks. 20 tablet 0  . traMADol (ULTRAM) 50 MG tablet Take 1-2 tablets (50-100 mg total) by mouth every 6 (six) hours as needed (mild pain). 80 tablet 1  . zolpidem  (AMBIEN) 5 MG tablet Take 5 mg by mouth at bedtime as needed for sleep.      No current facility-administered medications for this visit.     PHYSICAL EXAMINATION: ECOG PERFORMANCE STATUS: 1 - Symptomatic but completely ambulatory  Vitals:   09/05/16 1359  BP: (!) 131/45  Pulse: 85  Resp: 18  Temp: 97.6 F (36.4 C)   Filed Weights   09/05/16 1359  Weight: 196 lb (88.9 kg)    GENERAL:alert, no distress and comfortable SKIN: skin color, texture, turgor are normal, no rashes or significant lesions EYES: normal, Conjunctiva are pink and non-injected, sclera clear OROPHARYNX:no exudate, no erythema and lips, buccal mucosa, and tongue normal  NECK: supple, thyroid normal size, non-tender, without nodularity LYMPH:  no palpable lymphadenopathy in the cervical, axillary or inguinal LUNGS: clear to auscultation and percussion with normal breathing effort HEART: regular rate & rhythm and no murmurs and no lower extremity edema ABDOMEN:abdomen soft, non-tender and normal bowel sounds MUSCULOSKELETAL:no cyanosis of digits and no clubbing  NEURO: alert & oriented x 3 with fluent speech, no focal motor/sensory deficits EXTREMITIES: No lower extremity edema BREAST: No palpable masses or nodules in either right or left breasts. No palpable axillary supraclavicular or infraclavicular adenopathy no breast tenderness or nipple discharge. (exam performed in the presence of a chaperone)  LABORATORY DATA:  I have reviewed the data as listed   Chemistry      Component Value Date/Time   NA 143 12/19/2015 0424   K 4.7 12/19/2015 0424   CL 111 12/19/2015 0424   CO2 24 12/19/2015 0424   BUN 10 12/19/2015 0424   CREATININE 0.57 12/19/2015 0424      Component Value Date/Time   CALCIUM 8.8 (L) 12/19/2015 0424   ALKPHOS 89 12/07/2015 1140   AST 21 12/07/2015 1140   ALT 21 12/07/2015 1140   BILITOT 0.5 12/07/2015 1140       Lab Results  Component Value Date   WBC 10.9 (H) 12/19/2015   HGB  10.5 (L) 12/19/2015   HCT 31.6 (L) 12/19/2015   MCV 90.5 12/19/2015   PLT 274 12/19/2015    ASSESSMENT & PLAN:  Breast cancer of lower-outer quadrant of left female breast ([place]) Left breast invasive ductal carcinoma diagnosed and treated in [place] diagnosed October 2014 status post lumpectomy January 2015 1.1 cm IDC grade 1 T1 cN0 M0 stage IA, 0/2 lymph nodes, ER/PR positive HER-2 negative status post MammoSite radiation started anastrozole 1 mg daily March 2015, switched to tamoxifen 06/20/2015 discontinued 08/29/2015  Anastrozole toxicities: Severe emotional problems requiring Zoloft, Ambien, clonazepam which she attributes partially to the medication as well as to her mother passing away last year along with moving to [place]. Skin dryness and Lack of feeling of wellness Tamoxifen Toxicities: she tolerated tamoxifen better but she had episodes of major depression and hence she discontinued it after being on it for 3 months.  Breast Cancer Surveillance: 1. Breast exam 09/05/2016: Normal 2. Mammogram August 2017 No abnormalities. Postsurgical changes. Breast Density Category B. I recommended that she get 3-D mammograms for surveillance. Discussed the differences between different breast density categories.   Return to clinic in 1 year for follow-up with survivorship  No orders of the defined types were placed in this encounter.  The patient has a good understanding of the overall plan. she agrees with it. she will call with any problems that may develop before the next visit here.   Rulon Eisenmenger, MD 09/05/16

## 2016-09-26 DIAGNOSIS — Z23 Encounter for immunization: Secondary | ICD-10-CM | POA: Diagnosis not present

## 2016-09-26 DIAGNOSIS — E782 Mixed hyperlipidemia: Secondary | ICD-10-CM | POA: Diagnosis not present

## 2016-09-26 DIAGNOSIS — Z01818 Encounter for other preprocedural examination: Secondary | ICD-10-CM | POA: Diagnosis not present

## 2016-09-26 DIAGNOSIS — G250 Essential tremor: Secondary | ICD-10-CM | POA: Insufficient documentation

## 2016-09-26 DIAGNOSIS — M1712 Unilateral primary osteoarthritis, left knee: Secondary | ICD-10-CM | POA: Diagnosis not present

## 2016-10-16 DIAGNOSIS — M1712 Unilateral primary osteoarthritis, left knee: Secondary | ICD-10-CM | POA: Diagnosis not present

## 2016-10-17 ENCOUNTER — Ambulatory Visit: Payer: Self-pay | Admitting: Orthopedic Surgery

## 2016-10-17 NOTE — H&P (Signed)
Tara Murphy DOB: 10-Jun-1946 Married / Language: English / Race: White Female Date of Admission:  11/11/2016 CC:  Left Knee Pain History of Present Illness The patient is a 71 year old female who comes in for a preoperative History and Physical. The patient is scheduled for a left total knee arthroplasty to be performed by Dr. Dione Murphy. [name], MD at [facility] on 11-11-2016. The patient is a 71 year old female who presented with knee complaints. The patient was seen at [facility] in the past. The patient reports left knee symptoms including: pain (constant), swelling and pain increases at night which began 4 month(s) ago without any known injury. The patient describes the severity of the symptoms as severe. The patient describes their pain as aching, burning and stinging.The patient feels that the symptoms are worsening. The patient has the current diagnosis of knee osteoarthritis and medial meniscal tear. Previous work-up for this problem has included knee x-rays and knee MRI. Past treatment for this problem has included intra-articular injection of corticosteroids (tried cortisone and visco s/p 6 weeks ago: did not work). Symptoms are reported to be located in the left knee and include knee pain, medial knee pain, medial knee tenderness, swelling and difficulty bearing weight. The patient does not report any radiation of symptoms. Current treatment includes nonsteroidal anti-inflammatory drugs (Tramadol and Tylenol). Ms. Jeppsen had previously underwent a right total hip arthroplasty. She did fantastic with that and has actually no pain in her right hip. Unfortunately, several months ago, she had sudden onset of significant discomfort in her left knee. She has had problems with that knee in the past, but this has gotten much worse. She said it was too difficult to get in to see [name], so she went over to Dr. Debroah Murphy office. She initially saw [facility] and had a cortisone injection. This  did not help, so she subsequently had an MRI. She was told she had arthritis and a torn meniscus. She was sent to Dr. Mardelle Murphy who told her about the pathology on the MRI and told her that she would eventually need to have the knee replaced. She was seen in the clinic for a second opinion. She had decided that she wanted to proceed with surgery for the knee. They have been treated conservatively in the past for the above stated problem and despite conservative measures, they continue to have progressive pain and severe functional limitations and dysfunction. They have failed non-operative management including home exercise, medications, and injections. It is felt that they would benefit from undergoing total joint replacement. Risks and benefits of the procedure have been discussed with the patient and they elect to proceed with surgery. There are no active contraindications to surgery such as ongoing infection or rapidly progressive neurological disease.  Problem List/Past Medical Lumbar DDD (M51.36)  Primary osteoarthritis of left knee (M17.12)  Primary osteoarthritis of right hip (M16.11)  Breast Cancer  Left-sided Hypercholesterolemia  Hypothyroidism  Past History Osteoarthritis  Menopause  Urinary Tract Infection  Past Hsitory of C.Diff   Allergies  Sulfa Drugs  Rash. HYDROmorphone HCl *ANALGESICS - OPIOID*  Nausea. FentaNYL *ANALGESICS - OPIOID*  Flushing Terbinafine *DERMATOLOGICALS*  Iodinated Contrast  Rash, Hives. Diclofenac Potassium *ANALGESICS - ANTI-INFLAMMATORY*  Aleve *ANALGESICS - ANTI-INFLAMMATORY*  Facial Rash NSAIDs   Family History Cancer  Father. Chronic Obstructive Lung Disease  Mother. Congestive Heart Failure  Mother. Drug / Alcohol Addiction  Father. First Degree Relatives  reported Heart disease in female family member before  age 54  Osteoarthritis  Brother, Father, Mother, Sister. child  Social History  Children  2 Current drinker   09/19/2015: Currently drinks wine 8-14 times per week Current work status  retired Furniture conservator/restorer weekly; does gym / [employer] Living situation  live with spouse Marital status  married No history of drug/alcohol rehab  Not under pain contract  Number of flights of stairs before winded  2-3 Tobacco / smoke exposure  09/19/2015: no Tobacco use  Former smoker. 09/19/2015: smoke(d) 3 more pack(s) per day Advance Directives  Living Will Post-Surgical Plans  Home following the Left Total Knee  Medication History Clobetasol Propionate (0.05% Solution, External) Active. ClonazePAM (0.'5MG'$  Tablet, Oral) Active. Fluticasone Propionate (Nasal) (50MCG/DOSE Inhaler, Nasal) Active. Gabapentin ('300MG'$  Tablet, Oral) Active. (Dr Tara Murphy) TraMADol HCl ('50MG'$  Tablet, Oral) Active.  Past Surgical History Appendectomy  Date: 2002. Total Hip Replacement - Right [12/18/2015]: Breast Mass; Local Excision  Date: 09/2013. left Cataract Surgery  bilateral Colon Polyp Removal - Colonoscopy  Total Hip Replacement  Date: 2007. left Total Knee Replacement  Date: 2011. right Tubal Ligation  Root Canal    Review of Systems General Not Present- Chills, Fatigue, Fever, Memory Loss, Night Sweats, Weight Gain and Weight Loss. Skin Not Present- Eczema, Hives, Itching, Lesions and Rash. HEENT Not Present- Dentures, Double Vision, Headache, Hearing Loss, Tinnitus and Visual Loss. Respiratory Not Present- Allergies, Chronic Cough, Coughing up blood, Shortness of breath at rest and Shortness of breath with exertion. Cardiovascular Not Present- Chest Pain, Difficulty Breathing Lying Down, Murmur, Palpitations, Racing/skipping heartbeats and Swelling. Gastrointestinal Not Present- Abdominal Pain, Bloody Stool, Constipation, Diarrhea, Difficulty Swallowing, Heartburn, Jaundice, Loss of appetitie, Nausea and Vomiting. Female Genitourinary Not Present- Blood in Urine, Discharge, Flank Pain,  Incontinence, Painful Urination, Urgency, Urinary frequency, Urinary Retention, Urinating at Night and Weak urinary stream. Musculoskeletal Present- Joint Pain. Not Present- Back Pain, Joint Swelling, Morning Stiffness, Muscle Pain, Muscle Weakness and Spasms. Neurological Not Present- Blackout spells, Difficulty with balance, Dizziness, Paralysis, Tremor and Weakness. Psychiatric Not Present- Insomnia.  Vitals  Weight: 190 lb Height: 66in Weight was reported by patient. Height was reported by patient. Body Surface Area: 1.96 m Body Mass Index: 30.67 kg/m  Pulse: 76 (Regular)  BP: 144/82 (Sitting, Left Arm, Standard)   Physical Exam  General Mental Status -Alert, cooperative and good historian. General Appearance-pleasant, Not in acute distress. Orientation-Oriented X3. Build & Nutrition-Well nourished and Well developed.  Head and Neck Head-normocephalic, atraumatic . Neck Global Assessment - supple, no bruit auscultated on the right, no bruit auscultated on the left.  Eye Pupil - Bilateral-Regular and Round. Motion - Bilateral-EOMI.  Chest and Lung Exam Auscultation Breath sounds - clear at anterior chest wall and clear at posterior chest wall. Adventitious sounds - No Adventitious sounds.  Cardiovascular Auscultation Rhythm - Regular rate and rhythm. Heart Sounds - S1 WNL and S2 WNL. Murmurs & Other Heart Sounds: Murmur 1 - Location - Aortic Area and Pulmonic Area. Timing - Early systolic. Grade - II/VI. Character - Low pitched.  Abdomen Palpation/Percussion Tenderness - Abdomen is non-tender to palpation. Rigidity (guarding) - Abdomen is soft. Auscultation Auscultation of the abdomen reveals - Bowel sounds normal.  Female Genitourinary Note: Not done, not pertinent to present illness   Musculoskeletal Note: On exam, well-developed female, alert and oriented, in no apparent distress. She does not have pain on range of motion of her left  hip, which is flexion to about 110, rotation in 20 and out 30, abduction 30. Her left  knee shows no effusion. Range about 5 to 130. There is marked crepitus on range of motion of the knee. She is tender, medial greater than lateral, with no instability noted. Her right knee, which has been replaced, has range about 0 to 115, but no tenderness or instability.  RADIOGRAPHS Radiographs are reviewed from the other orthopedic office and she has got significant medial joint space narrowing, but has severe bone-on-bone change in the patellofemoral joint. She also has an MRI done, which confirms a severe patellofemoral arthritis.  Assessment & Plan Primary osteoarthritis of left knee (M17.12)  Note:Surgical Plans: Left Total Knee Replacement  Disposition: Home and straight to outpatient therapy at [facility] on Friday 11/15/2016. PCP: Dr. Jonni Sanger  Topical TXA - Breast Cancer  Anesthesia Issues: Does not tolerate general very well.  Patient was instructed on what medications to stop prior to surgery.  Signed electronically by Ok Edwards, III PA-

## 2016-10-25 ENCOUNTER — Ambulatory Visit: Payer: Self-pay | Admitting: Orthopedic Surgery

## 2016-10-28 ENCOUNTER — Inpatient Hospital Stay (HOSPITAL_COMMUNITY)
Admission: EM | Admit: 2016-10-28 | Discharge: 2016-10-30 | DRG: 419 | Disposition: A | Discharge status: Home / Self Care | Payer: Medicare Other | Attending: Emergency Medicine | Admitting: Emergency Medicine

## 2016-10-28 ENCOUNTER — Emergency Department (HOSPITAL_COMMUNITY): Payer: Medicare Other

## 2016-10-28 ENCOUNTER — Encounter (HOSPITAL_COMMUNITY): Payer: Self-pay | Admitting: Emergency Medicine

## 2016-10-28 DIAGNOSIS — K8020 Calculus of gallbladder without cholecystitis without obstruction: Secondary | ICD-10-CM

## 2016-10-28 DIAGNOSIS — Z87440 Personal history of urinary (tract) infections: Secondary | ICD-10-CM

## 2016-10-28 DIAGNOSIS — Z9049 Acquired absence of other specified parts of digestive tract: Secondary | ICD-10-CM

## 2016-10-28 DIAGNOSIS — R1013 Epigastric pain: Secondary | ICD-10-CM | POA: Diagnosis not present

## 2016-10-28 DIAGNOSIS — Z853 Personal history of malignant neoplasm of breast: Secondary | ICD-10-CM

## 2016-10-28 DIAGNOSIS — M1990 Unspecified osteoarthritis, unspecified site: Secondary | ICD-10-CM | POA: Diagnosis present

## 2016-10-28 DIAGNOSIS — K8010 Calculus of gallbladder with chronic cholecystitis without obstruction: Secondary | ICD-10-CM | POA: Diagnosis not present

## 2016-10-28 DIAGNOSIS — K8000 Calculus of gallbladder with acute cholecystitis without obstruction: Secondary | ICD-10-CM | POA: Diagnosis present

## 2016-10-28 DIAGNOSIS — R1011 Right upper quadrant pain: Secondary | ICD-10-CM | POA: Diagnosis not present

## 2016-10-28 DIAGNOSIS — Z885 Allergy status to narcotic agent status: Secondary | ICD-10-CM | POA: Diagnosis not present

## 2016-10-28 DIAGNOSIS — M1610 Unilateral primary osteoarthritis, unspecified hip: Secondary | ICD-10-CM | POA: Diagnosis not present

## 2016-10-28 DIAGNOSIS — R112 Nausea with vomiting, unspecified: Secondary | ICD-10-CM | POA: Diagnosis not present

## 2016-10-28 DIAGNOSIS — Z87891 Personal history of nicotine dependence: Secondary | ICD-10-CM

## 2016-10-28 DIAGNOSIS — Z96641 Presence of right artificial hip joint: Secondary | ICD-10-CM | POA: Diagnosis present

## 2016-10-28 DIAGNOSIS — Z882 Allergy status to sulfonamides status: Secondary | ICD-10-CM

## 2016-10-28 DIAGNOSIS — L280 Lichen simplex chronicus: Secondary | ICD-10-CM | POA: Diagnosis present

## 2016-10-28 DIAGNOSIS — Z9109 Other allergy status, other than to drugs and biological substances: Secondary | ICD-10-CM

## 2016-10-28 DIAGNOSIS — Z91041 Radiographic dye allergy status: Secondary | ICD-10-CM

## 2016-10-28 DIAGNOSIS — C50512 Malignant neoplasm of lower-outer quadrant of left female breast: Secondary | ICD-10-CM | POA: Diagnosis not present

## 2016-10-28 DIAGNOSIS — R079 Chest pain, unspecified: Secondary | ICD-10-CM | POA: Diagnosis not present

## 2016-10-28 DIAGNOSIS — M179 Osteoarthritis of knee, unspecified: Secondary | ICD-10-CM | POA: Diagnosis not present

## 2016-10-28 DIAGNOSIS — Z888 Allergy status to other drugs, medicaments and biological substances status: Secondary | ICD-10-CM | POA: Diagnosis not present

## 2016-10-28 HISTORY — DX: Calculus of gallbladder without cholecystitis without obstruction: K80.20

## 2016-10-28 LAB — CBC
HEMATOCRIT: 44.4 % (ref 36.0–46.0)
HEMATOCRIT: 39.3 % (ref 36.0–46.0)
HEMOGLOBIN: 14.7 g/dL (ref 12.0–15.0)
Hemoglobin: 12.9 g/dL (ref 12.0–15.0)
MCH: 29.6 pg (ref 26.0–34.0)
MCH: 29.5 pg (ref 26.0–34.0)
MCHC: 33.1 g/dL (ref 30.0–36.0)
MCHC: 32.8 g/dL (ref 30.0–36.0)
MCV: 89.5 fL (ref 78.0–100.0)
MCV: 89.9 fL (ref 78.0–100.0)
PLATELETS: 331 10*3/uL (ref 150–400)
Platelets: 349 10*3/uL (ref 150–400)
RBC: 4.96 MIL/uL (ref 3.87–5.11)
RBC: 4.37 MIL/uL (ref 3.87–5.11)
RDW: 12.7 % (ref 11.5–15.5)
RDW: 12.9 % (ref 11.5–15.5)
WBC: 14.7 10*3/uL — ABNORMAL HIGH (ref 4.0–10.5)
WBC: 14.9 10*3/uL — AB (ref 4.0–10.5)

## 2016-10-28 LAB — HEPATIC FUNCTION PANEL
ALK PHOS: 97 U/L (ref 38–126)
ALT: 23 U/L (ref 14–54)
AST: 42 U/L — AB (ref 15–41)
Albumin: 4.7 g/dL (ref 3.5–5.0)
BILIRUBIN DIRECT: 0.5 mg/dL (ref 0.1–0.5)
Indirect Bilirubin: 0.7 mg/dL (ref 0.3–0.9)
TOTAL PROTEIN: 7.1 g/dL (ref 6.5–8.1)
Total Bilirubin: 1.2 mg/dL (ref 0.3–1.2)

## 2016-10-28 LAB — BASIC METABOLIC PANEL
ANION GAP: 13 (ref 5–15)
BUN: 16 mg/dL (ref 6–20)
CHLORIDE: 105 mmol/L (ref 101–111)
CO2: 23 mmol/L (ref 22–32)
Calcium: 10.2 mg/dL (ref 8.9–10.3)
Creatinine, Ser: 0.75 mg/dL (ref 0.44–1.00)
GFR calc Af Amer: >60 mL/min (ref >60)
GFR calc non Af Amer: >60 mL/min (ref >60)
GLUCOSE: 143 mg/dL — AB (ref 65–99)
POTASSIUM: 4.2 mmol/L (ref 3.5–5.1)
Sodium: 141 mmol/L (ref 135–145)

## 2016-10-28 LAB — I-STAT TROPONIN, ED: Troponin i, poc: 0.02 ng/mL (ref 0.00–0.08)

## 2016-10-28 LAB — LIPASE, BLOOD: Lipase: 28 U/L (ref 11–51)

## 2016-10-28 MED ORDER — HYDRALAZINE HCL 20 MG/ML IJ SOLN: 10.0000 mg | INTRAMUSCULAR | Status: DC | PRN

## 2016-10-28 MED ORDER — MORPHINE SULFATE (PF) 4 MG/ML IV SOLN
4.0000 mg | Freq: Once | INTRAVENOUS | Status: AC
  Administered 2016-10-28: 4 mg via INTRAVENOUS
  Filled 2016-10-28: qty 1

## 2016-10-28 MED ORDER — METHOCARBAMOL 500 MG PO TABS
500.0000 mg | ORAL_TABLET | Freq: Four times a day (QID) | ORAL | Status: DC | PRN
  Administered 2016-10-28 – 2016-10-30 (×4): 500 mg via ORAL
  Filled 2016-10-28 (×4): qty 1

## 2016-10-28 MED ORDER — ENOXAPARIN SODIUM 40 MG/0.4ML ~~LOC~~ SOLN
40.0000 mg | SUBCUTANEOUS | Status: DC
  Administered 2016-10-30: 40 mg via SUBCUTANEOUS
  Filled 2016-10-28: qty 0.4

## 2016-10-28 MED ORDER — TRAMADOL HCL 50 MG PO TABS: 50.0000 mg | ORAL_TABLET | Freq: Four times a day (QID) | ORAL | Status: DC | PRN

## 2016-10-28 MED ORDER — ONDANSETRON 4 MG PO TBDP: 4.0000 mg | ORAL_TABLET | Freq: Four times a day (QID) | ORAL | Status: DC | PRN

## 2016-10-28 MED ORDER — KETOROLAC TROMETHAMINE 30 MG/ML IJ SOLN
15.0000 mg | Freq: Once | INTRAMUSCULAR | Status: AC
  Administered 2016-10-28: 15 mg via INTRAVENOUS
  Filled 2016-10-28: qty 1

## 2016-10-28 MED ORDER — CLONAZEPAM 0.5 MG PO TABS
0.5000 mg | ORAL_TABLET | Freq: Every day | ORAL | Status: DC | PRN
  Administered 2016-10-29: 0.5 mg via ORAL
  Filled 2016-10-28: qty 1

## 2016-10-28 MED ORDER — SODIUM CHLORIDE 0.9 % IV BOLUS (SEPSIS)
1000.0000 mL | Freq: Once | INTRAVENOUS | Status: AC
  Administered 2016-10-28: 1000 mL via INTRAVENOUS

## 2016-10-28 MED ORDER — CLOBETASOL PROPIONATE 0.05 % EX SOLN: 1.0000 "application " | Freq: Two times a day (BID) | CUTANEOUS | Status: DC | PRN

## 2016-10-28 MED ORDER — BISACODYL 10 MG RE SUPP: 10.0000 mg | Freq: Every day | RECTAL | Status: DC | PRN

## 2016-10-28 MED ORDER — ZOLPIDEM TARTRATE 5 MG PO TABS
5.0000 mg | ORAL_TABLET | Freq: Every evening | ORAL | Status: DC | PRN
  Administered 2016-10-29: 5 mg via ORAL
  Filled 2016-10-28: qty 1

## 2016-10-28 MED ORDER — MORPHINE SULFATE (PF) 2 MG/ML IV SOLN
1.0000 mg | INTRAVENOUS | Status: DC | PRN
  Administered 2016-10-28: 2 mg via INTRAVENOUS
  Administered 2016-10-29: 4 mg via INTRAVENOUS
  Administered 2016-10-29: 2 mg via INTRAVENOUS
  Filled 2016-10-28: qty 2
  Filled 2016-10-28 (×2): qty 1

## 2016-10-28 MED ORDER — DIPHENHYDRAMINE HCL 50 MG/ML IJ SOLN: 12.5000 mg | Freq: Four times a day (QID) | INTRAMUSCULAR | Status: DC | PRN

## 2016-10-28 MED ORDER — ONDANSETRON HCL 4 MG/2ML IJ SOLN: 4.0000 mg | Freq: Four times a day (QID) | INTRAMUSCULAR | Status: DC | PRN

## 2016-10-28 MED ORDER — ONDANSETRON HCL 4 MG/2ML IJ SOLN
4.0000 mg | Freq: Once | INTRAMUSCULAR | Status: AC
  Administered 2016-10-28: 4 mg via INTRAVENOUS
  Filled 2016-10-28: qty 2

## 2016-10-28 MED ORDER — DIPHENHYDRAMINE HCL 12.5 MG/5ML PO ELIX: 12.5000 mg | ORAL_SOLUTION | Freq: Four times a day (QID) | ORAL | Status: DC | PRN

## 2016-10-28 MED ORDER — OXYCODONE HCL 5 MG PO TABS
5.0000 mg | ORAL_TABLET | ORAL | Status: DC | PRN
  Administered 2016-10-29 – 2016-10-30 (×2): 10 mg via ORAL
  Filled 2016-10-28 (×2): qty 2

## 2016-10-28 MED ORDER — PIPERACILLIN-TAZOBACTAM 3.375 G IVPB
3.3750 g | Freq: Three times a day (TID) | INTRAVENOUS | Status: DC
  Administered 2016-10-29 – 2016-10-30 (×5): 3.375 g via INTRAVENOUS
  Filled 2016-10-28 (×6): qty 50

## 2016-10-28 MED ORDER — SENNA 8.6 MG PO TABS
1.0000 | ORAL_TABLET | Freq: Two times a day (BID) | ORAL | Status: DC
  Administered 2016-10-29 – 2016-10-30 (×2): 8.6 mg via ORAL
  Filled 2016-10-28 (×2): qty 1

## 2016-10-28 MED ORDER — FLUTICASONE PROPIONATE 50 MCG/ACT NA SUSP
1.0000 | Freq: Every day | NASAL | Status: DC | PRN
  Filled 2016-10-28: qty 16

## 2016-10-28 MED ORDER — POTASSIUM CHLORIDE IN NACL 20-0.9 MEQ/L-% IV SOLN
INTRAVENOUS | Status: DC
  Administered 2016-10-28 – 2016-10-30 (×4): via INTRAVENOUS
  Filled 2016-10-28 (×4): qty 1000

## 2016-10-28 MED ORDER — GABAPENTIN 300 MG PO CAPS
600.0000 mg | ORAL_CAPSULE | Freq: Two times a day (BID) | ORAL | Status: DC
  Administered 2016-10-28 – 2016-10-30 (×3): 600 mg via ORAL
  Filled 2016-10-28 (×3): qty 2

## 2016-10-28 NOTE — ED Triage Notes (Signed)
Pt from home with c/o CP starting this morning with N/V and back pain between shoulder blades.  Pt states she has never felt pain like this before, unable to sit still in the chair during triage but calm and cooperative.  A&O.

## 2016-10-28 NOTE — H&P (Signed)
Tara Murphy is an 71 y.o. female.   Chief Complaint: abdominal pain HPI:  Pt is a 71 yo F who was awakened with pain at 6 AM.  It was very sharp and did not go away.  This was associated with nausea and vomiting.  She denies fever/ chills.  No jaundice.  Denies diarrhea or constipation.  Sister had gallstones and had to be in hospital for 11 days.  She denies any prior history of similar pain, even less severe.  Despite multiple doses of IV pain medication, they are unable to get her pain free.    She does have bloating, but states that she has had c diff several times in the past after antibiotics and that she attributed the bloating to that.    Past Medical History:  Diagnosis Date  . Arthritis   . Breast cancer ([place])    left   . Lichen plano-pilaris   . Urinary tract infection    hx of frequent     Past Surgical History:  Procedure Laterality Date  . APPENDECTOMY    . cataracts    . LIPOSUCTION    . TOTAL HIP ARTHROPLASTY    . TOTAL HIP ARTHROPLASTY Right 12/18/2015   Procedure: RIGHT TOTAL HIP ARTHROPLASTY ANTERIOR APPROACH;  Surgeon: Gaynelle Arabian, MD;  Location: [facility];  Service: Orthopedics;  Laterality: Right;  . TOTAL KNEE ARTHROPLASTY    . TUBAL LIGATION      History reviewed. No pertinent family history. Social History:  reports that she quit smoking about 24 years ago. Her smoking use included Cigarettes. She has never used smokeless tobacco. She reports that she drinks about 2.4 oz of alcohol per week . She reports that she does not use drugs.  Allergies:  Allergies  Allergen Reactions  . Contrast Media [Iodinated Diagnostic Agents] Rash  . Hydromorphone Hcl Nausea And Vomiting  . Sulfamethoxazole-Trimethoprim Hives  . Fentanyl Other (See Comments)    Redness and flushing over body   . Tape Rash    Certain Band-Aids don't agree with the patient's skin  . Terbinafine And Related Hives and Rash     (Not in a hospital admission)  Results for orders placed  or performed during the hospital encounter of 10/28/16 (from the past 48 hour(s))  Basic metabolic panel     Status: Abnormal   Collection Time: 10/28/16  1:20 PM  Result Value Ref Range   Sodium 141 135 - 145 mmol/L   Potassium 4.2 3.5 - 5.1 mmol/L   Chloride 105 101 - 111 mmol/L   CO2 23 22 - 32 mmol/L   Glucose, Bld 143 (H) 65 - 99 mg/dL   BUN 16 6 - 20 mg/dL   Creatinine, Ser 0.75 0.44 - 1.00 mg/dL   Calcium 10.2 8.9 - 10.3 mg/dL   GFR calc non Af Amer >60 >60 mL/min   GFR calc Af Amer >60 >60 mL/min    Comment: (NOTE) The eGFR has been calculated using the CKD EPI equation. This calculation has not been validated in all clinical situations. eGFR's persistently <60 mL/min signify possible Chronic Kidney Disease.    Anion gap 13 5 - 15  CBC     Status: Abnormal   Collection Time: 10/28/16  1:20 PM  Result Value Ref Range   WBC 14.7 (H) 4.0 - 10.5 K/uL   RBC 4.96 3.87 - 5.11 MIL/uL   Hemoglobin 14.7 12.0 - 15.0 g/dL   HCT 44.4 36.0 - 46.0 %  MCV 89.5 78.0 - 100.0 fL   MCH 29.6 26.0 - 34.0 pg   MCHC 33.1 30.0 - 36.0 g/dL   RDW 12.7 11.5 - 15.5 %   Platelets 349 150 - 400 K/uL  I-stat troponin, ED     Status: None   Collection Time: 10/28/16  1:41 PM  Result Value Ref Range   Troponin i, poc 0.02 0.00 - 0.08 ng/mL   Comment 3            Comment: Due to the release kinetics of cTnI, a negative result within the first hours of the onset of symptoms does not rule out myocardial infarction with certainty. If myocardial infarction is still suspected, repeat the test at appropriate intervals.   Hepatic function panel     Status: Abnormal   Collection Time: 10/28/16  4:55 PM  Result Value Ref Range   Total Protein 7.1 6.5 - 8.1 g/dL   Albumin 4.7 3.5 - 5.0 g/dL   AST 42 (H) 15 - 41 U/L   ALT 23 14 - 54 U/L   Alkaline Phosphatase 97 38 - 126 U/L   Total Bilirubin 1.2 0.3 - 1.2 mg/dL   Bilirubin, Direct 0.5 0.1 - 0.5 mg/dL   Indirect Bilirubin 0.7 0.3 - 0.9 mg/dL   Lipase, blood     Status: None   Collection Time: 10/28/16  4:55 PM  Result Value Ref Range   Lipase 28 11 - 51 U/L   Dg Chest 2 View  Result Date: 10/28/2016 CLINICAL DATA:  Chest pain, vomiting EXAM: CHEST  2 VIEW COMPARISON:  None. FINDINGS: Linear atelectasis or scarring in the lingula. Right lung clear. Heart is upper limits normal in size. No effusions or acute bony abnormality. IMPRESSION: Lingular atelectasis or scarring.  No active disease. Electronically Signed   By: Rolm Baptise M.D.   On: 10/28/2016 13:39   US Abdomen Limited Ruq  Result Date: 10/28/2016 CLINICAL DATA:  Chest pain starting this morning. History of breast cancer. EXAM: US ABDOMEN LIMITED - RIGHT UPPER QUADRANT COMPARISON:  None. FINDINGS: Gallbladder: Multiple gallstones in the gallbladder measuring up to 2.4 cm in length. No gallbladder wall thickening. Sonographic Murphy's sign absent. Is also some sludge in the gallbladder. Common bile duct: Diameter: 5 mm Liver: Coarse echogenic liver with poor sonic penetration compatible with diffuse hepatic steatosis. Mild fatty sparing along the gallbladder fossa. IMPRESSION: 1. Cholelithiasis and gallbladder sludge.  No biliary dilatation. 2. Coarse echogenic liver with poor sonic penetration compatible with diffuse hepatic steatosis. Electronically Signed   By: Van Clines M.D.   On: 10/28/2016 18:17    Review of Systems  Constitutional: Negative.   HENT: Negative.   Eyes: Negative.   Respiratory: Negative.   Cardiovascular: Negative.   Gastrointestinal: Positive for abdominal pain, nausea and vomiting.  Genitourinary: Negative.   Musculoskeletal: Negative.   Skin: Negative.   Neurological: Negative.   Endo/Heme/Allergies: Negative.   Psychiatric/Behavioral: Negative.     Blood pressure 135/93, pulse 107, temperature 98.7 F (37.1 C), temperature source Oral, resp. rate 15, height 5' 6" (1.676 m), weight 86.2 kg (190 lb), SpO2 97 %. Physical Exam   Constitutional: She is oriented to person, place, and time. She appears well-developed and well-nourished. She appears distressed.  HENT:  Head: Normocephalic.  Mouth/Throat: No oropharyngeal exudate.  Eyes: Conjunctivae are normal. Pupils are equal, round, and reactive to light. No scleral icterus.  Neck: Neck supple.  Cardiovascular: Normal rate, regular rhythm and intact distal pulses.  Respiratory: Effort normal. No respiratory distress.  GI: Soft. She exhibits no distension. There is tenderness (RUQ). There is guarding (voluntary). There is no rebound.  Musculoskeletal: Normal range of motion. She exhibits no edema.  Neurological: She is alert and oriented to person, place, and time.  Skin: Skin is warm and dry. No rash noted. She is not diaphoretic. No erythema. No pallor.  Psychiatric: She has a normal mood and affect. Her behavior is normal. Judgment and thought content normal.     Assessment/Plan Acute calculous cholecystitis. Although imaging does not show thickened gallbladder wall or pericholecystic fluid, the patient clinically has cholecystitis.    IV antibiotics. NPO except for clears. Lap chole with cholangiogram when OR available. Will d/w Dr. Brantley Stage and acute care surgery team in AM.  The surgical procedure was described to the patient in detail.  The patient was given educational material.  I discussed the incision type and location, the location of the gallbladder, the anatomy of the bile ducts and arteries, and the typical progression of surgery.  I discussed the possibility of converting to an open operation.  I advised of the risks of bleeding, infection, damage to other structures (such as the bile duct, intestine or liver), bile leak, need for other procedures or surgeries, and post op diarrhea/constipation.  We discussed the risk of blood clot.  We discussed the recovery period and post operative restrictions.  The patient was advised against taking blood  thinners the week before surgery.      Stark Klein, MD 10/28/2016, 8:22 PM

## 2016-10-28 NOTE — ED Provider Notes (Signed)
[facility] DEPT Provider Note   CSN: [id number] Arrival date & time: 10/28/16  1225     History   Chief Complaint Chief Complaint  Patient presents with  . Abdominal Pain    HPI Christabell Parcel is a 71 y.o. female.  Patient c/o epigastric pain, radiating to back onset this AM, at rest. Pain constant, dull, mod-severe. No specific exacerbating or alleviating factors. Nausea w episodes of emesis, yellowish, not bloody or bilious. Has been having normal bms. Denies chest pain or sob. No fever or chills. +fam hx gallstones. No hx pancreatitis. Only abd surgery is remote hx appendectomy.    The history is provided by the patient.  Chest Pain   Associated symptoms include abdominal pain, nausea and vomiting. Pertinent negatives include no back pain, no fever, no headaches and no shortness of breath.    Past Medical History:  Diagnosis Date  . Arthritis   . Breast cancer ([place])    left   . Lichen plano-pilaris   . Urinary tract infection    hx of frequent     Patient Active Problem List   Diagnosis Date Noted  . OA (osteoarthritis) of knee 12/18/2015  . OA (osteoarthritis) of hip 12/18/2015  . 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 Breast cancer of lower-outer quadrant of left female breast ([place]) 03/20/2015    Past Surgical History:  Procedure Laterality Date  . APPENDECTOMY    . cataracts    . LIPOSUCTION    . TOTAL HIP ARTHROPLASTY    . TOTAL HIP ARTHROPLASTY Right 12/18/2015   Procedure: RIGHT TOTAL HIP ARTHROPLASTY ANTERIOR APPROACH;  Surgeon: Gaynelle Arabian, MD;  Location: [facility] ORS;  Service: Orthopedics;  Laterality: Right;  . TOTAL KNEE ARTHROPLASTY    . TUBAL LIGATION      OB History    No data available       Home Medications    Prior to Admission medications   Medication Sig Start Date End Date Taking? Authorizing Provider  acetaminophen (TYLENOL) 500 MG tablet Take 500-1,000 mg by mouth every 6 (six) hours as needed for headache (or pain).    Yes  Historical Provider, MD  clobetasol (TEMOVATE) 0.05 % external solution Apply 1 application topically 2 (two) times daily as needed (for scalp inflamation). To scalp 03/05/15  Yes Historical Provider, MD  clonazePAM (KLONOPIN) 0.5 MG tablet Take 1 tablet by mouth daily as needed. For sleep 06/14/15  Yes Historical Provider, MD  fluticasone (FLONASE) 50 MCG/ACT nasal spray Place 1 spray into the nose daily as needed for allergies or rhinitis.    Yes Historical Provider, MD  gabapentin (NEURONTIN) 300 MG capsule Take 600 mg by mouth 2 (two) times daily.    Yes Historical Provider, MD  traMADol (ULTRAM) 50 MG tablet Take 1-2 tablets (50-100 mg total) by mouth every 6 (six) hours as needed (mild pain). 12/19/15  Yes Alexzandrew L Perkins, PA-C  methocarbamol (ROBAXIN) 500 MG tablet Take 1 tablet (500 mg total) by mouth every 6 (six) hours as needed for muscle spasms. Patient not taking: Reported on 10/28/2016 12/19/15   Alexzandrew L Perkins, PA-C  oxyCODONE (OXY IR/ROXICODONE) 5 MG immediate release tablet Take 1-2 tablets (5-10 mg total) by mouth every 3 (three) hours as needed for moderate pain or severe pain. Patient not taking: Reported on 10/28/2016 12/19/15   Alexzandrew L Dara Lords, PA-C  rivaroxaban (XARELTO) 10 MG TABS tablet Take 1 tablet (10 mg total) by mouth daily with breakfast. Take Xarelto for two and a  half more weeks, then discontinue Xarelto. Once the patient has completed the blood thinner regimen, then take a Baby 81 mg Aspirin daily for three more weeks. Patient not taking: Reported on 10/28/2016 12/19/15   Alexzandrew Monika Salk, PA-C    Family History History reviewed. No pertinent family history.  Social History Social History  Substance Use Topics  . Smoking status: Former Smoker    Types: Cigarettes    Quit date: 11/22/1991  . Smokeless tobacco: Never Used  . Alcohol use 2.4 oz/week    4 Glasses of wine per week     Allergies   Contrast media [iodinated diagnostic agents];  Hydromorphone hcl; Sulfamethoxazole-trimethoprim; Fentanyl; Tape; and Terbinafine and related   Review of Systems Review of Systems  Constitutional: Negative for chills and fever.  Eyes: Negative for redness.  Respiratory: Negative for shortness of breath.   Cardiovascular: Negative for chest pain and leg swelling.  Gastrointestinal: Positive for abdominal pain, nausea and vomiting.  Genitourinary: Negative for dysuria and flank pain.  Musculoskeletal: Negative for back pain and neck pain.  Skin: Negative for rash.  Neurological: Negative for headaches.  Hematological: Does not bruise/bleed easily.  Psychiatric/Behavioral: Negative for confusion.     Physical Exam Updated Vital Signs BP 146/77   Pulse 84   Temp 98.7 F (37.1 C) (Oral)   Resp 24   Ht '5\' 6"'$  (1.676 m)   Wt 86.2 kg   SpO2 98%   BMI 30.67 kg/m   Physical Exam  Constitutional: She appears well-developed and well-nourished. No distress.  HENT:  Mouth/Throat: Oropharynx is clear and moist.  Eyes: Conjunctivae are normal. No scleral icterus.  Neck: Neck supple. No tracheal deviation present.  Cardiovascular: Normal rate, regular rhythm, normal heart sounds and intact distal pulses.   Pulmonary/Chest: Effort normal and breath sounds normal. No respiratory distress.  Abdominal: Soft. Normal appearance and bowel sounds are normal. She exhibits no distension and no mass. There is tenderness. There is no rebound and no guarding. No hernia.  Epigastric and upper abd tenderness.   Genitourinary:  Genitourinary Comments: No cva tenderness  Musculoskeletal: She exhibits no edema.  Neurological: She is alert.  Skin: Skin is warm and dry. No rash noted. She is not diaphoretic.  Psychiatric: She has a normal mood and affect.  Nursing note and vitals reviewed.    ED Treatments / Results  Labs (all labs ordered are listed, but only abnormal results are displayed) Results for orders placed or performed during the  hospital encounter of 25/00/37  Basic metabolic panel  Result Value Ref Range   Sodium 141 135 - 145 mmol/L   Potassium 4.2 3.5 - 5.1 mmol/L   Chloride 105 101 - 111 mmol/L   CO2 23 22 - 32 mmol/L   Glucose, Bld 143 (H) 65 - 99 mg/dL   BUN 16 6 - 20 mg/dL   Creatinine, Ser 0.75 0.44 - 1.00 mg/dL   Calcium 10.2 8.9 - 10.3 mg/dL   GFR calc non Af Amer >60 >60 mL/min   GFR calc Af Amer >60 >60 mL/min   Anion gap 13 5 - 15  CBC  Result Value Ref Range   WBC 14.7 (H) 4.0 - 10.5 K/uL   RBC 4.96 3.87 - 5.11 MIL/uL   Hemoglobin 14.7 12.0 - 15.0 g/dL   HCT 44.4 36.0 - 46.0 %   MCV 89.5 78.0 - 100.0 fL   MCH 29.6 26.0 - 34.0 pg   MCHC 33.1 30.0 - 36.0 g/dL  RDW 12.7 11.5 - 15.5 %   Platelets 349 150 - 400 K/uL  Hepatic function panel  Result Value Ref Range   Total Protein 7.1 6.5 - 8.1 g/dL   Albumin 4.7 3.5 - 5.0 g/dL   AST 42 (H) 15 - 41 U/L   ALT 23 14 - 54 U/L   Alkaline Phosphatase 97 38 - 126 U/L   Total Bilirubin 1.2 0.3 - 1.2 mg/dL   Bilirubin, Direct 0.5 0.1 - 0.5 mg/dL   Indirect Bilirubin 0.7 0.3 - 0.9 mg/dL  Lipase, blood  Result Value Ref Range   Lipase 28 11 - 51 U/L  I-stat troponin, ED  Result Value Ref Range   Troponin i, poc 0.02 0.00 - 0.08 ng/mL   Comment 3           Dg Chest 2 View  Result Date: 10/28/2016 CLINICAL DATA:  Chest pain, vomiting EXAM: CHEST  2 VIEW COMPARISON:  None. FINDINGS: Linear atelectasis or scarring in the lingula. Right lung clear. Heart is upper limits normal in size. No effusions or acute bony abnormality. IMPRESSION: Lingular atelectasis or scarring.  No active disease. Electronically Signed   By: Rolm Baptise M.D.   On: 10/28/2016 13:39   US Abdomen Limited Ruq  Result Date: 10/28/2016 CLINICAL DATA:  Chest pain starting this morning. History of breast cancer. EXAM: US ABDOMEN LIMITED - RIGHT UPPER QUADRANT COMPARISON:  None. FINDINGS: Gallbladder: Multiple gallstones in the gallbladder measuring up to 2.4 cm in length. No  gallbladder wall thickening. Sonographic Murphy's sign absent. Is also some sludge in the gallbladder. Common bile duct: Diameter: 5 mm Liver: Coarse echogenic liver with poor sonic penetration compatible with diffuse hepatic steatosis. Mild fatty sparing along the gallbladder fossa. IMPRESSION: 1. Cholelithiasis and gallbladder sludge.  No biliary dilatation. 2. Coarse echogenic liver with poor sonic penetration compatible with diffuse hepatic steatosis. Electronically Signed   By: Van Clines M.D.   On: 10/28/2016 18:17    EKG  EKG Interpretation  Date/Time:  Monday October 28 2016 13:07:13 EST Ventricular Rate:  77 PR Interval:  160 QRS Duration: 72 QT Interval:  414 QTC Calculation: 468 R Axis:   3 Text Interpretation:  Normal sinus rhythm Nonspecific ST abnormality Confirmed by Ashok Cordia  MD, Lennette Bihari ([id number]) on 10/28/2016 4:44:41 PM       Radiology Dg Chest 2 View  Result Date: 10/28/2016 CLINICAL DATA:  Chest pain, vomiting EXAM: CHEST  2 VIEW COMPARISON:  None. FINDINGS: Linear atelectasis or scarring in the lingula. Right lung clear. Heart is upper limits normal in size. No effusions or acute bony abnormality. IMPRESSION: Lingular atelectasis or scarring.  No active disease. Electronically Signed   By: Rolm Baptise M.D.   On: 10/28/2016 13:39   US Abdomen Limited Ruq  Result Date: 10/28/2016 CLINICAL DATA:  Chest pain starting this morning. History of breast cancer. EXAM: US ABDOMEN LIMITED - RIGHT UPPER QUADRANT COMPARISON:  None. FINDINGS: Gallbladder: Multiple gallstones in the gallbladder measuring up to 2.4 cm in length. No gallbladder wall thickening. Sonographic Murphy's sign absent. Is also some sludge in the gallbladder. Common bile duct: Diameter: 5 mm Liver: Coarse echogenic liver with poor sonic penetration compatible with diffuse hepatic steatosis. Mild fatty sparing along the gallbladder fossa. IMPRESSION: 1. Cholelithiasis and gallbladder sludge.  No biliary dilatation.  2. Coarse echogenic liver with poor sonic penetration compatible with diffuse hepatic steatosis. Electronically Signed   By: Van Clines M.D.   On: 10/28/2016 18:17  Procedures Procedures (including critical care time)  Medications Ordered in ED Medications  sodium chloride 0.9 % bolus 1,000 mL (1,000 mLs Intravenous New Bag/Given 10/28/16 1729)  ondansetron (ZOFRAN) injection 4 mg (4 mg Intravenous Given 10/28/16 1738)  morphine 4 MG/ML injection 4 mg (4 mg Intravenous Given 10/28/16 1738)     Initial Impression / Assessment and Plan / ED Course  I have reviewed the triage vital signs and the nursing notes.  Pertinent labs & imaging results that were available during my care of the patient were reviewed by me and considered in my medical decision making (see chart for details).  Reviewed nursing notes and prior charts for additional history.   Morphine iv. zofran iv. Labs. Iv ns bolus.  Ultrasound.  U/s c/w gallstones - discussed w pt.   On recheck pain persists, ruq tenderness on exam.  Additional morphine iv. zofran iv. toradol iv.   Given persistent pain and tenderness, will consult general surgery - discussed w Dr Barry Dienes - they will see in ED.     Final Clinical Impressions(s) / ED Diagnoses   Final diagnoses:  RUQ pain    New Prescriptions New Prescriptions   No medications on file     Lajean Saver, MD 10/28/16 2008

## 2016-10-28 NOTE — ED Notes (Signed)
Pt updated on plan of care while waiting in lobby

## 2016-10-29 ENCOUNTER — Encounter (HOSPITAL_COMMUNITY): Payer: Self-pay | Admitting: General Practice

## 2016-10-29 ENCOUNTER — Encounter (HOSPITAL_COMMUNITY): Admission: EM | Disposition: A | Discharge status: Home / Self Care | Payer: Self-pay | Source: Home / Self Care

## 2016-10-29 ENCOUNTER — Inpatient Hospital Stay (HOSPITAL_COMMUNITY): Payer: Medicare Other | Admitting: Anesthesiology

## 2016-10-29 HISTORY — PX: CHOLECYSTECTOMY: SHX55

## 2016-10-29 LAB — COMPREHENSIVE METABOLIC PANEL
ALK PHOS: 77 U/L (ref 38–126)
ALT: 24 U/L (ref 14–54)
AST: 28 U/L (ref 15–41)
Albumin: 3.5 g/dL (ref 3.5–5.0)
Anion gap: 11 (ref 5–15)
BUN: 11 mg/dL (ref 6–20)
CALCIUM: 8.8 mg/dL — AB (ref 8.9–10.3)
CHLORIDE: 107 mmol/L (ref 101–111)
CO2: 25 mmol/L (ref 22–32)
CREATININE: 0.74 mg/dL (ref 0.44–1.00)
Glucose, Bld: 121 mg/dL — ABNORMAL HIGH (ref 65–99)
Potassium: 4.3 mmol/L (ref 3.5–5.1)
Sodium: 143 mmol/L (ref 135–145)
Total Bilirubin: 1.2 mg/dL (ref 0.3–1.2)
Total Protein: 5.9 g/dL — ABNORMAL LOW (ref 6.5–8.1)

## 2016-10-29 LAB — CBC
HCT: 37.5 % (ref 36.0–46.0)
Hemoglobin: 12.1 g/dL (ref 12.0–15.0)
MCH: 29.4 pg (ref 26.0–34.0)
MCHC: 32.3 g/dL (ref 30.0–36.0)
MCV: 91 fL (ref 78.0–100.0)
PLATELETS: 300 10*3/uL (ref 150–400)
RBC: 4.12 MIL/uL (ref 3.87–5.11)
RDW: 13.2 % (ref 11.5–15.5)
WBC: 10.8 10*3/uL — ABNORMAL HIGH (ref 4.0–10.5)

## 2016-10-29 LAB — CREATININE, SERUM
Creatinine, Ser: 0.84 mg/dL (ref 0.44–1.00)
GFR calc non Af Amer: >60 mL/min (ref >60)

## 2016-10-29 LAB — SURGICAL PCR SCREEN
MRSA, PCR: NEGATIVE
Staphylococcus aureus: NEGATIVE

## 2016-10-29 SURGERY — LAPAROSCOPIC CHOLECYSTECTOMY
Anesthesia: General | Site: Abdomen

## 2016-10-29 MED ORDER — SUFENTANIL CITRATE 50 MCG/ML IV SOLN
INTRAVENOUS | Status: DC | PRN
  Administered 2016-10-29 (×2): 5 ug via INTRAVENOUS
  Administered 2016-10-29: 20 ug via INTRAVENOUS
  Administered 2016-10-29 (×2): 10 ug via INTRAVENOUS

## 2016-10-29 MED ORDER — SUFENTANIL CITRATE 50 MCG/ML IV SOLN
INTRAVENOUS | Status: AC
  Filled 2016-10-29: qty 1

## 2016-10-29 MED ORDER — PHENYLEPHRINE HCL 10 MG/ML IJ SOLN
INTRAMUSCULAR | Status: DC | PRN
  Administered 2016-10-29: 80 ug via INTRAVENOUS
  Administered 2016-10-29 (×4): 120 ug via INTRAVENOUS

## 2016-10-29 MED ORDER — DEXAMETHASONE SODIUM PHOSPHATE 10 MG/ML IJ SOLN
INTRAMUSCULAR | Status: DC | PRN
  Administered 2016-10-29: 10 mg via INTRAVENOUS

## 2016-10-29 MED ORDER — BUPIVACAINE-EPINEPHRINE (PF) 0.5% -1:200000 IJ SOLN
INTRAMUSCULAR | Status: DC | PRN
  Administered 2016-10-29: 9 mL

## 2016-10-29 MED ORDER — ONDANSETRON HCL 4 MG/2ML IJ SOLN
INTRAMUSCULAR | Status: DC | PRN
  Administered 2016-10-29: 4 mg via INTRAVENOUS

## 2016-10-29 MED ORDER — BUPIVACAINE-EPINEPHRINE (PF) 0.5% -1:200000 IJ SOLN
INTRAMUSCULAR | Status: AC
  Filled 2016-10-29: qty 30

## 2016-10-29 MED ORDER — ROCURONIUM BROMIDE 100 MG/10ML IV SOLN
INTRAVENOUS | Status: DC | PRN
  Administered 2016-10-29: 40 mg via INTRAVENOUS
  Administered 2016-10-29: 10 mg via INTRAVENOUS
  Administered 2016-10-29: 200 mg via INTRAVENOUS

## 2016-10-29 MED ORDER — 0.9 % SODIUM CHLORIDE (POUR BTL) OPTIME
TOPICAL | Status: DC | PRN
  Administered 2016-10-29: 1000 mL

## 2016-10-29 MED ORDER — LIDOCAINE HCL (CARDIAC) 20 MG/ML IV SOLN
INTRAVENOUS | Status: DC | PRN
  Administered 2016-10-29: 60 mg via INTRAVENOUS

## 2016-10-29 MED ORDER — MIDAZOLAM HCL 2 MG/2ML IJ SOLN
INTRAMUSCULAR | Status: AC
  Filled 2016-10-29: qty 2

## 2016-10-29 MED ORDER — MORPHINE SULFATE (PF) 4 MG/ML IV SOLN
INTRAVENOUS | Status: AC
  Administered 2016-10-29: 2 mg via INTRAVENOUS
  Filled 2016-10-29: qty 1

## 2016-10-29 MED ORDER — LACTATED RINGERS IV SOLN
INTRAVENOUS | Status: DC
  Administered 2016-10-29: 12:00:00 via INTRAVENOUS

## 2016-10-29 MED ORDER — PROPOFOL 10 MG/ML IV BOLUS
INTRAVENOUS | Status: AC
  Filled 2016-10-29: qty 20

## 2016-10-29 MED ORDER — DEXAMETHASONE SODIUM PHOSPHATE 10 MG/ML IJ SOLN
INTRAMUSCULAR | Status: AC
  Filled 2016-10-29: qty 3

## 2016-10-29 MED ORDER — EPHEDRINE SULFATE 50 MG/ML IJ SOLN
INTRAMUSCULAR | Status: DC | PRN
  Administered 2016-10-29 (×2): 5 mg via INTRAVENOUS

## 2016-10-29 MED ORDER — IOPAMIDOL (ISOVUE-300) INJECTION 61%
INTRAVENOUS | Status: AC
  Filled 2016-10-29: qty 50

## 2016-10-29 MED ORDER — MORPHINE SULFATE (PF) 4 MG/ML IV SOLN
1.0000 mg | INTRAVENOUS | Status: DC | PRN
  Administered 2016-10-29 (×2): 2 mg via INTRAVENOUS

## 2016-10-29 MED ORDER — PROPOFOL 10 MG/ML IV BOLUS
INTRAVENOUS | Status: DC | PRN
  Administered 2016-10-29: 150 mg via INTRAVENOUS

## 2016-10-29 MED ORDER — ONDANSETRON HCL 4 MG/2ML IJ SOLN
INTRAMUSCULAR | Status: AC
  Filled 2016-10-29: qty 2

## 2016-10-29 MED ORDER — PHENYLEPHRINE HCL 10 MG/ML IJ SOLN
INTRAVENOUS | Status: DC | PRN
  Administered 2016-10-29: 20 ug/min via INTRAVENOUS

## 2016-10-29 MED ORDER — MIDAZOLAM HCL 5 MG/5ML IJ SOLN
INTRAMUSCULAR | Status: DC | PRN
  Administered 2016-10-29: 2 mg via INTRAVENOUS

## 2016-10-29 MED ORDER — SUCCINYLCHOLINE CHLORIDE 200 MG/10ML IV SOSY
PREFILLED_SYRINGE | INTRAVENOUS | Status: DC | PRN
  Administered 2016-10-29: 120 mg via INTRAVENOUS

## 2016-10-29 MED ORDER — PIPERACILLIN-TAZOBACTAM 3.375 G IVPB
3.3750 g | INTRAVENOUS | Status: DC
  Filled 2016-10-29: qty 50

## 2016-10-29 MED ORDER — SUGAMMADEX SODIUM 200 MG/2ML IV SOLN
INTRAVENOUS | Status: DC | PRN
  Administered 2016-10-29: 200 mg via INTRAVENOUS

## 2016-10-29 MED ORDER — IOPAMIDOL (ISOVUE-300) INJECTION 61%
INTRAVENOUS | Status: DC | PRN
  Administered 2016-10-29: .1 mL

## 2016-10-29 MED ORDER — PROMETHAZINE HCL 25 MG/ML IJ SOLN: 6.2500 mg | INTRAMUSCULAR | Status: DC | PRN

## 2016-10-29 MED ORDER — SCOPOLAMINE 1 MG/3DAYS TD PT72
1.0000 | MEDICATED_PATCH | Freq: Once | TRANSDERMAL | Status: DC
  Administered 2016-10-29: 1.5 mg via TRANSDERMAL
  Filled 2016-10-29: qty 1

## 2016-10-29 MED ORDER — SODIUM CHLORIDE 0.9 % IR SOLN
Status: DC | PRN
  Administered 2016-10-29: 1000 mL

## 2016-10-29 SURGICAL SUPPLY — 38 items
APPLIER CLIP ROT 10 11.4 M/L (STAPLE) ×4
BLADE SURG ROTATE 9660 (MISCELLANEOUS) IMPLANT
CANISTER SUCTION 2500CC (MISCELLANEOUS) ×4 IMPLANT
CHLORAPREP W/TINT 26ML (MISCELLANEOUS) ×4 IMPLANT
CLIP APPLIE ROT 10 11.4 M/L (STAPLE) ×2 IMPLANT
COVER MAYO STAND STRL (DRAPES) ×4 IMPLANT
COVER SURGICAL LIGHT HANDLE (MISCELLANEOUS) ×4 IMPLANT
DERMABOND ADVANCED (GAUZE/BANDAGES/DRESSINGS) ×2
DERMABOND ADVANCED .7 DNX12 (GAUZE/BANDAGES/DRESSINGS) ×2 IMPLANT
DRAPE C-ARM 42X72 X-RAY (DRAPES) ×4 IMPLANT
DRAPE WARM FLUID 44X44 (DRAPE) ×4 IMPLANT
ELECT REM PT RETURN 9FT ADLT (ELECTROSURGICAL) ×4
ELECTRODE REM PT RTRN 9FT ADLT (ELECTROSURGICAL) ×2 IMPLANT
GLOVE BIO SURGEON STRL SZ8 (GLOVE) ×4 IMPLANT
GLOVE BIOGEL PI IND STRL 8 (GLOVE) ×2 IMPLANT
GLOVE BIOGEL PI INDICATOR 8 (GLOVE) ×2
GOWN STRL REUS W/ TWL LRG LVL3 (GOWN DISPOSABLE) ×4 IMPLANT
GOWN STRL REUS W/ TWL XL LVL3 (GOWN DISPOSABLE) ×2 IMPLANT
GOWN STRL REUS W/TWL LRG LVL3 (GOWN DISPOSABLE) ×4
GOWN STRL REUS W/TWL XL LVL3 (GOWN DISPOSABLE) ×2
KIT BASIN OR (CUSTOM PROCEDURE TRAY) ×4 IMPLANT
KIT ROOM TURNOVER OR (KITS) ×4 IMPLANT
NS IRRIG 1000ML POUR BTL (IV SOLUTION) ×4 IMPLANT
PAD ARMBOARD 7.5X6 YLW CONV (MISCELLANEOUS) ×4 IMPLANT
POUCH SPECIMEN RETRIEVAL 10MM (ENDOMECHANICALS) ×4 IMPLANT
SCISSORS LAP 5X35 DISP (ENDOMECHANICALS) ×4 IMPLANT
SET CHOLANGIOGRAPH 5 50 .035 (SET/KITS/TRAYS/PACK) ×4 IMPLANT
SET IRRIG TUBING LAPAROSCOPIC (IRRIGATION / IRRIGATOR) ×4 IMPLANT
SLEEVE ENDOPATH XCEL 5M (ENDOMECHANICALS) ×4 IMPLANT
SPECIMEN JAR SMALL (MISCELLANEOUS) ×4 IMPLANT
SUT MNCRL AB 4-0 PS2 18 (SUTURE) ×4 IMPLANT
TOWEL OR 17X24 6PK STRL BLUE (TOWEL DISPOSABLE) ×4 IMPLANT
TOWEL OR 17X26 10 PK STRL BLUE (TOWEL DISPOSABLE) ×4 IMPLANT
TRAY LAPAROSCOPIC MC (CUSTOM PROCEDURE TRAY) ×4 IMPLANT
TROCAR XCEL BLUNT TIP 100MML (ENDOMECHANICALS) ×4 IMPLANT
TROCAR XCEL NON-BLD 11X100MML (ENDOMECHANICALS) ×4 IMPLANT
TROCAR XCEL NON-BLD 5MMX100MML (ENDOMECHANICALS) ×4 IMPLANT
TUBING INSUFFLATION (TUBING) ×4 IMPLANT

## 2016-10-29 NOTE — Interval H&P Note (Signed)
History and Physical Interval Note:  10/29/2016 12:06 PM  Tara Murphy  has presented today for surgery, with the diagnosis of cholecystistis  The various methods of treatment have been discussed with the patient and family. After consideration of risks, benefits and other options for treatment, the patient has consented to  Procedure(s): LAPAROSCOPIC CHOLECYSTECTOMY WITH INTRAOPERATIVE CHOLANGIOGRAM (N/A) as a surgical intervention .  The patient's history has been reviewed, patient examined, no change in status, stable for surgery.  I have reviewed the patient's chart and labs.  Questions were answered to the patient's satisfaction.     Jennipher Weatherholtz A.

## 2016-10-29 NOTE — Anesthesia Preprocedure Evaluation (Addendum)
Anesthesia Evaluation   Patient awake    Reviewed: Allergy & Precautions, NPO status , Patient's Chart, lab work & pertinent test results  History of Anesthesia Complications Negative for: history of anesthetic complications  Airway Mallampati: III  TM Distance: >3 FB Neck ROM: Full    Dental no notable dental hx. (+) Dental Advisory Given, Teeth Intact   Pulmonary former smoker,    Pulmonary exam normal        Cardiovascular negative cardio ROS Normal cardiovascular exam     Neuro/Psych Depression negative neurological ROS     GI/Hepatic Neg liver ROS,   Endo/Other  negative endocrine ROS  Renal/GU negative Renal ROS     Musculoskeletal  (+) Arthritis , Osteoarthritis,    Abdominal   Peds  Hematology negative hematology ROS (+)   Anesthesia Other Findings   Reproductive/Obstetrics                           Lab Results  Component Value Date   WBC 10.8 (H) 10/29/2016   HGB 12.1 10/29/2016   HCT 37.5 10/29/2016   MCV 91.0 10/29/2016   PLT 300 10/29/2016   Lab Results  Component Value Date   CREATININE 0.74 10/29/2016   BUN 11 10/29/2016   NA 143 10/29/2016   K 4.3 10/29/2016   CL 107 10/29/2016   CO2 25 10/29/2016   Lab Results  Component Value Date   INR 0.92 12/07/2015    Anesthesia Physical  Anesthesia Plan  ASA: II  Anesthesia Plan: General   Post-op Pain Management:    Induction: Intravenous  Airway Management Planned: Oral ETT  Additional Equipment:   Intra-op Plan:   Post-operative Plan: Extubation in OR  Informed Consent: I have reviewed the patients History and Physical, chart, labs and discussed the procedure including the risks, benefits and alternatives for the proposed anesthesia with the patient or authorized representative who has indicated his/her understanding and acceptance.   Dental advisory given  Plan Discussed with: CRNA,  Anesthesiologist and Surgeon  Anesthesia Plan Comments:        Anesthesia Quick Evaluation

## 2016-10-29 NOTE — Progress Notes (Signed)
[facility] Progress Note     Subjective: Pt still having RUQ abdominal pain. No nausea or vomiting. No fever or chills overnight.   Objective: Vital signs in last 24 hours: Temp:  [97.7 F (36.5 C)-99.3 F (37.4 C)] 99.3 F (37.4 C) (02/06 0625) Pulse Rate:  [68-107] 77 (02/06 0625) Resp:  [15-27] 20 (02/06 0625) BP: (116-170)/(63-93) 116/70 (02/06 0625) SpO2:  [94 %-100 %] 96 % (02/06 0625) Weight:  [183 lb (83 kg)-190 lb (86.2 kg)] 183 lb (83 kg) (02/05 2331) Last BM Date: 10/28/16  Intake/Output from previous day: 02/05 0701 - 02/06 0700 In: 1028.3 [P.O.:240; I.V.:738.3; IV Piggyback:50] Out: 1050 [Urine:1050] Intake/Output this shift: Total I/O In: -  Out: 200 [Urine:200]  PE: Gen:  Alert, NAD, pleasant, cooperative, lying in bed Card:  RRR, no M/G/R heard Pulm:  Rate and effort normal Abd: Soft, nondistended, +BS, TTp in RUQ Skin: no rashes noted, warm and dry  Lab Results:   Recent Labs  10/28/16 1320 10/28/16 2303  WBC 14.7* 14.9*  HGB 14.7 12.9  HCT 44.4 39.3  PLT 349 331   BMET  Recent Labs  10/28/16 1320 10/28/16 2303  NA 141  --   K 4.2  --   CL 105  --   CO2 23  --   GLUCOSE 143*  --   BUN 16  --   CREATININE 0.75 0.84  CALCIUM 10.2  --    PT/INR No results for input(s): LABPROT, INR in the last 72 hours. CMP     Component Value Date/Time   NA 141 10/28/2016 1320   K 4.2 10/28/2016 1320   CL 105 10/28/2016 1320   CO2 23 10/28/2016 1320   GLUCOSE 143 (H) 10/28/2016 1320   BUN 16 10/28/2016 1320   CREATININE 0.84 10/28/2016 2303   CALCIUM 10.2 10/28/2016 1320   PROT 7.1 10/28/2016 1655   ALBUMIN 4.7 10/28/2016 1655   AST 42 (H) 10/28/2016 1655   ALT 23 10/28/2016 1655   ALKPHOS 97 10/28/2016 1655   BILITOT 1.2 10/28/2016 1655   GFRNONAA >60 10/28/2016 2303   GFRAA >60 10/28/2016 2303   Lipase     Component Value Date/Time   LIPASE 28 10/28/2016 1655       Studies/Results: Dg Chest 2 View  Result  Date: 10/28/2016 CLINICAL DATA:  Chest pain, vomiting EXAM: CHEST  2 VIEW COMPARISON:  None. FINDINGS: Linear atelectasis or scarring in the lingula. Right lung clear. Heart is upper limits normal in size. No effusions or acute bony abnormality. IMPRESSION: Lingular atelectasis or scarring.  No active disease. Electronically Signed   By: Rolm Baptise M.D.   On: 10/28/2016 13:39   US Abdomen Limited Ruq  Result Date: 10/28/2016 CLINICAL DATA:  Chest pain starting this morning. History of breast cancer. EXAM: US ABDOMEN LIMITED - RIGHT UPPER QUADRANT COMPARISON:  None. FINDINGS: Gallbladder: Multiple gallstones in the gallbladder measuring up to 2.4 cm in length. No gallbladder wall thickening. Sonographic Murphy's sign absent. Is also some sludge in the gallbladder. Common bile duct: Diameter: 5 mm Liver: Coarse echogenic liver with poor sonic penetration compatible with diffuse hepatic steatosis. Mild fatty sparing along the gallbladder fossa. IMPRESSION: 1. Cholelithiasis and gallbladder sludge.  No biliary dilatation. 2. Coarse echogenic liver with poor sonic penetration compatible with diffuse hepatic steatosis. Electronically Signed   By: Van Clines M.D.   On: 10/28/2016 18:17    Anti-infectives: Anti-infectives    Start     Dose/Rate Route  Frequency Ordered Stop   10/28/16 2300  piperacillin-tazobactam (ZOSYN) IVPB 3.375 g     3.375 g 12.5 mL/hr over 240 Minutes Intravenous Every 8 hours 10/28/16 2258         Assessment/Plan  Arthritis Breast cancer Lichen plano-pilaris Hx of frequent UTI's  Acute calculous cholecystitis - will go to OR today for lap chole with possible IOC - NPO    LOS: 1 day    Kalman Drape , [facility] 10/29/2016, 8:42 AM Pager: [phone] Consults: [phone] Mon-Fri 7:00 am-4:30 pm Sat-Sun 7:00 am-11:30 am

## 2016-10-29 NOTE — Anesthesia Procedure Notes (Signed)
Procedure Name: Intubation Date/Time: 10/29/2016 12:56 PM Performed by: Carney Living Pre-anesthesia Checklist: Patient identified, Emergency Drugs available, Suction available, Patient being monitored and Timeout performed Patient Re-evaluated:Patient Re-evaluated prior to inductionOxygen Delivery Method: Circle system utilized Preoxygenation: Pre-oxygenation with 100% oxygen Intubation Type: IV induction Ventilation: Mask ventilation without difficulty Laryngoscope Size: Mac and 4 Grade View: Grade II Tube type: Oral Tube size: 7.0 mm Number of attempts: 1 Airway Equipment and Method: Stylet Placement Confirmation: ETT inserted through vocal cords under direct vision,  positive ETCO2 and breath sounds checked- equal and bilateral Secured at: 22 cm Tube secured with: Tape Dental Injury: Teeth and Oropharynx as per pre-operative assessment

## 2016-10-29 NOTE — Anesthesia Postprocedure Evaluation (Addendum)
Anesthesia Post Note  Patient: [name]  Procedure(s) Performed: Procedure(s) (LRB): LAPAROSCOPIC CHOLECYSTECTOMY (N/A)  Patient location during evaluation: PACU Anesthesia Type: General Level of consciousness: awake and alert Pain management: pain level controlled Vital Signs Assessment: post-procedure vital signs reviewed and stable Respiratory status: spontaneous breathing, nonlabored ventilation, respiratory function stable and patient connected to nasal cannula oxygen Cardiovascular status: blood pressure returned to baseline and stable Postop Assessment: no signs of nausea or vomiting Anesthetic complications: no       Last Vitals:  Vitals:   10/29/16 1530 10/29/16 1545  BP: (!) 144/80 (!) 145/68  Pulse: 86 79  Resp: 19 18  Temp: 36.3 C 36.9 C    Last Pain:  Vitals:   10/29/16 1545  TempSrc:   PainSc: 4                  Tara Murphy

## 2016-10-29 NOTE — Transfer of Care (Signed)
Immediate Anesthesia Transfer of Care Note  Patient: Tara Murphy  Procedure(s) Performed: Procedure(s): LAPAROSCOPIC CHOLECYSTECTOMY (N/A)  Patient Location: PACU  Anesthesia Type:General  Level of Consciousness: awake, alert , oriented and patient cooperative  Airway & Oxygen Therapy: Patient Spontanous Breathing and Patient connected to nasal cannula oxygen  Post-op Assessment: Report given to RN, Post -op Vital signs reviewed and stable and Patient moving all extremities X 4  Post vital signs: Reviewed and stable  Last Vitals:  Vitals:   10/29/16 1429 10/29/16 1430  BP:  127/71  Pulse:  97  Resp: 18 18  Temp:  36.6 C    Last Pain:  Vitals:   10/29/16 1122  TempSrc: Oral  PainSc:          Complications: No apparent anesthesia complications

## 2016-10-29 NOTE — Op Note (Signed)
Laparoscopic Cholecystectomy  Procedure Note  Indications: This patient presents with symptomatic gallbladder disease and will undergo laparoscopic cholecystectomy. The procedure has been discussed with the patient. Operative and non operative treatments have been discussed. Risks of surgery include bleeding, infection,  Common bile duct injury,  Injury to the stomach,liver, colon,small intestine, abdominal wall,  Diaphragm,  Major blood vessels,  And the need for an open procedure.  Other risks include worsening of medical problems, death,  DVT and pulmonary embolism, and cardiovascular events.   Medical options have also been discussed. The patient has been informed of long term expectations of surgery and non surgical options,  The patient agrees to proceed.    Pre-operative Diagnosis: Calculus of gallbladder with acute cholecystitis, without mention of obstruction  Post-operative Diagnosis: Same  Surgeon: Omarius Grantham A.   Assistants: [place]  RNFA  Anesthesia: General endotracheal anesthesia and Local anesthesia 0.25.% bupivacaine, with epinephrine  ASA Class: 3  Procedure Details  The patient was seen again in the Holding Room. The risks, benefits, complications, treatment options, and expected outcomes were discussed with the patient. The possibilities of reaction to medication, pulmonary aspiration, perforation of viscus, bleeding, recurrent infection, finding a normal gallbladder, the need for additional procedures, failure to diagnose a condition, the possible need to convert to an open procedure, and creating a complication requiring transfusion or operation were discussed with the patient. The patient and/or family concurred with the proposed plan, giving informed consent. The site of surgery properly noted/marked. The patient was taken to Operating Room, identified as [facility] and the procedure verified as Laparoscopic Cholecystectomy with Intraoperative Cholangiograms. A Time  Out was held and the above information confirmed.  Prior to the induction of general anesthesia, antibiotic prophylaxis was administered. General endotracheal anesthesia was then administered and tolerated well. After the induction, the abdomen was prepped in the usual sterile fashion. The patient was positioned in the supine position with the left arm comfortably tucked, along with some reverse Trendelenburg.  Local anesthetic agent was injected into the skin near the umbilicus and an incision made. The midline fascia was incised and the Hasson technique was used to introduce a 12 mm port under direct vision. It was secured with a figure of eight Vicryl suture placed in the usual fashion. Pneumoperitoneum was then created with CO2 and tolerated well without any adverse changes in the patient's vital signs. Additional trocars were introduced under direct vision with an 11 mm trocar in the epigastrium and 2 5 mm trocars in the right upper quadrant. All skin incisions were infiltrated with a local anesthetic agent before making the incision and placing the trocars.   The gallbladder was identified, the fundus grasped and retracted cephalad. Adhesions were lysed bluntly and with the electrocautery where indicated, taking care not to injure any adjacent organs or viscus. The infundibulum was grasped and retracted laterally, exposing the peritoneum overlying the triangle of Calot. This was then divided and exposed in a blunt fashion. The cystic duct was clearly identified and bluntly dissected circumferentially. The junctions of the gallbladder, cystic duct and common bile duct were clearly identified prior to the division of any linear structure.   The patient had a contrast allergy. The critical view was obtained.  He U/S showed no ductal dilation and her LFT were normal.  .   The cystic duct was then  ligated with surgical clips  on the patient side and  clipped on the gallbladder side and divided. The  cystic artery was identified, dissected  free, ligated with clips and divided as well. Posterior cystic artery clipped and divided.  The gallbladder was dissected from the liver bed in retrograde fashion with the electrocautery. The gallbladder was removed. The liver bed was irrigated and inspected. Hemostasis was achieved with the electrocautery. Copious irrigation was utilized and was repeatedly aspirated until clear all particulate matter. Hemostasis was achieved with no signs of bleeding or bile leakage.  Pneumoperitoneum was completely reduced after viewing removal of the trocars under direct vision. The wound was thoroughly irrigated and the fascia was then closed with a figure of eight suture; the skin was then closed with 4 O MONOCRYL  and a sterile dressing was applied.  Instrument, sponge, and needle counts were correct at closure and at the conclusion of the case.   Findings: Cholecystitis with Cholelithiasis  Estimated Blood Loss: less than 100 mL         Drains: none          Total IV Fluids: 800 mL         Specimens: Gallbladder           Complications: None; patient tolerated the procedure well.         Disposition: PACU - hemodynamically stable.         Condition: stable

## 2016-10-30 ENCOUNTER — Encounter (HOSPITAL_COMMUNITY): Payer: Self-pay | Admitting: Surgery

## 2016-10-30 LAB — COMPREHENSIVE METABOLIC PANEL
ALBUMIN: 3 g/dL — AB (ref 3.5–5.0)
ALK PHOS: 69 U/L (ref 38–126)
ALT: 75 U/L — AB (ref 14–54)
ANION GAP: 11 (ref 5–15)
AST: 69 U/L — ABNORMAL HIGH (ref 15–41)
BILIRUBIN TOTAL: 0.7 mg/dL (ref 0.3–1.2)
BUN: 5 mg/dL — ABNORMAL LOW (ref 6–20)
CALCIUM: 8.1 mg/dL — AB (ref 8.9–10.3)
CO2: 22 mmol/L (ref 22–32)
CREATININE: 0.55 mg/dL (ref 0.44–1.00)
Chloride: 106 mmol/L (ref 101–111)
GFR calc Af Amer: >60 mL/min (ref >60)
GFR calc non Af Amer: >60 mL/min (ref >60)
GLUCOSE: 130 mg/dL — AB (ref 65–99)
Potassium: 4.1 mmol/L (ref 3.5–5.1)
Sodium: 139 mmol/L (ref 135–145)
TOTAL PROTEIN: 5.2 g/dL — AB (ref 6.5–8.1)

## 2016-10-30 LAB — CBC
HCT: 33.3 % — ABNORMAL LOW (ref 36.0–46.0)
HEMOGLOBIN: 10.7 g/dL — AB (ref 12.0–15.0)
MCH: 29.5 pg (ref 26.0–34.0)
MCHC: 32.1 g/dL (ref 30.0–36.0)
MCV: 91.7 fL (ref 78.0–100.0)
Platelets: 248 10*3/uL (ref 150–400)
RBC: 3.63 MIL/uL — ABNORMAL LOW (ref 3.87–5.11)
RDW: 12.9 % (ref 11.5–15.5)
WBC: 10.4 10*3/uL (ref 4.0–10.5)

## 2016-10-30 MED ORDER — OXYCODONE HCL 5 MG PO TABS: 5.0000 mg | ORAL_TABLET | ORAL | 0 refills | Status: DC | PRN

## 2016-10-30 NOTE — Discharge Instructions (Signed)
[facility] ______[facility] °LAPAROSCOPIC SURGERY: POST OP INSTRUCTIONS °Always review your discharge instruction sheet given to you by the facility where your surgery was performed. °IF YOU HAVE DISABILITY OR FAMILY LEAVE FORMS, YOU MUST BRING THEM TO THE OFFICE FOR PROCESSING.   °DO NOT GIVE THEM TO YOUR DOCTOR. ° °1. A prescription for pain medication may be given to you upon discharge.  Take your pain medication as prescribed, if needed.  If narcotic pain medicine is not needed, then you may take acetaminophen (Tylenol) or ibuprofen (Advil) as needed. °2. Take your usually prescribed medications unless otherwise directed. °3. If you need a refill on your pain medication, please contact your pharmacy.  They will contact our office to request authorization. Prescriptions will not be filled after 5pm or on week-ends. °4. You should follow a light diet the first few days after arrival home, such as soup and crackers, etc.  Be sure to include lots of fluids daily. °5. Most patients will experience some swelling and bruising in the area of the incisions.  Ice packs will help.  Swelling and bruising can take several days to resolve.  °6. It is common to experience some constipation if taking pain medication after surgery.  Increasing fluid intake and taking a stool softener (such as Colace) will usually help or prevent this problem from occurring.  A mild laxative (Milk of Magnesia or Miralax) should be taken according to package instructions if there are no bowel movements after 48 hours. °7. Unless discharge instructions indicate otherwise, you may remove your bandages 24-48 hours after surgery, and you may shower at that time.  You may have steri-strips (small skin tapes) in place directly over the incision.  These strips should be left on the skin for 7-10 days.  If your surgeon used skin glue on the incision, you may shower in 24 hours.  The glue will flake off over the next 2-3 weeks.  Any sutures or  staples will be removed at the office during your follow-up visit. °8. ACTIVITIES:  You may resume regular (light) daily activities beginning the next day--such as daily self-care, walking, climbing stairs--gradually increasing activities as tolerated.  You may have sexual intercourse when it is comfortable.  Refrain from any heavy lifting or straining until approved by your doctor. °a. You may drive when you are no longer taking prescription pain medication, you can comfortably wear a seatbelt, and you can safely maneuver your car and apply brakes. °b. RETURN TO WORK:  __________________________________________________________ °9. You should see your doctor in the office for a follow-up appointment approximately 2-3 weeks after your surgery.  Make sure that you call for this appointment within a day or two after you arrive home to insure a convenient appointment time. °10. OTHER INSTRUCTIONS: __________________________________________________________________________________________________________________________ __________________________________________________________________________________________________________________________ °WHEN TO CALL YOUR DOCTOR: °1. Fever over 101.0 °2. Inability to urinate °3. Continued bleeding from incision. °4. Increased pain, redness, or drainage from the incision. °5. Increasing abdominal pain ° °The clinic staff is available to answer your questions during regular business hours.  Please don’t hesitate to call and ask to speak to one of the nurses for clinical concerns.  If you have a medical emergency, go to the nearest emergency room or call 911.  A surgeon from [facility] is always on call at the hospital. °[address], [address], [place], [place]  [address] ? [address] [address], [place],    [address] °[phone] ? [phone] ? FAX [phone] °Web site:   [email or web address] °

## 2016-10-30 NOTE — Discharge Summary (Signed)
Physician Discharge Summary  Patient ID: Tara Murphy MRN: [id number] DOB/AGE: 30-Jun-1946 71 y.o.  Admit date: 10/28/2016 Discharge date: 10/30/2016  Admission Diagnoses:acute cholecystitis   Discharge Diagnoses:  Active Problems:   Acute calculous cholecystitis   Discharged Condition: good  Hospital Course: pt admitted for acute cholecystitis and laparoscopic cholecystectomy  She did well.  Diet advance and the patient felt improvement after surgery. Pt had good pain control.   Consults: None    Treatments: surgery: cholecystectomy  Laparoscopic   Discharge Exam: Blood pressure (!) 108/58, pulse 64, temperature 97.8 F (36.6 C), temperature source Oral, resp. rate 17, height '5\' 6"'$  (1.676 m), weight 83 kg (183 lb), SpO2 94 %. General appearance: alert and cooperative Resp: clear to auscultation bilaterally Cardio: regular rate and rhythm, S1, S2 normal, no murmur, click, rub or gallop Incision/Wound:CDI   Disposition: 06-Home-Health Care Svc  Discharge Instructions    Diet - low sodium heart healthy    Complete by:  As directed    Increase activity slowly    Complete by:  As directed      Allergies as of 10/30/2016      Reactions   Contrast Media [iodinated Diagnostic Agents] Rash   Hydromorphone Hcl Nausea And Vomiting   Sulfamethoxazole-trimethoprim Hives   Fentanyl Other (See Comments)   Redness and flushing over body    Tape Rash   Certain Band-Aids don't agree with the patient's skin   Terbinafine And Related Hives, Rash      Medication List    STOP taking these medications   acetaminophen 500 MG tablet Commonly known as:  TYLENOL   rivaroxaban 10 MG Tabs tablet Commonly known as:  XARELTO     TAKE these medications   clobetasol 0.05 % external solution Commonly known as:  TEMOVATE Apply 1 application topically 2 (two) times daily as needed (for scalp inflamation). To scalp   clonazePAM 0.5 MG tablet Commonly known as:  KLONOPIN Take 1 tablet by  mouth daily as needed. For sleep   fluticasone 50 MCG/ACT nasal spray Commonly known as:  FLONASE Place 1 spray into the nose daily as needed for allergies or rhinitis.   gabapentin 300 MG capsule Commonly known as:  NEURONTIN Take 600 mg by mouth 2 (two) times daily.   methocarbamol 500 MG tablet Commonly known as:  ROBAXIN Take 1 tablet (500 mg total) by mouth every 6 (six) hours as needed for muscle spasms.   oxyCODONE 5 MG immediate release tablet Commonly known as:  Oxy IR/ROXICODONE Take 1-2 tablets (5-10 mg total) by mouth every 3 (three) hours as needed for moderate pain or severe pain.   traMADol 50 MG tablet Commonly known as:  ULTRAM Take 1-2 tablets (50-100 mg total) by mouth every 6 (six) hours as needed (mild pain).        Signed: Aaima Gaddie A. 10/30/2016, 10:07 AM

## 2016-10-30 NOTE — Progress Notes (Signed)
1 Day Post-Op  Subjective: Doing well  Objective: Vital signs in last 24 hours: Temp:  [97.4 F (36.3 C)-99.4 F (37.4 C)] 97.8 F (36.6 C) (02/07 0544) Pulse Rate:  [64-97] 64 (02/07 0544) Resp:  [17-37] 17 (02/07 0544) BP: (92-145)/(54-80) 108/58 (02/07 0544) SpO2:  [93 %-100 %] 94 % (02/07 0544) Last BM Date: 10/29/16  Intake/Output from previous day: 02/06 0701 - 02/07 0700 In: 8264 [P.O.:120; I.V.:3300; IV Piggyback:150] Out: 1583 [Urine:4050] Intake/Output this shift: Total I/O In: 246 [P.O.:246] Out: 425 [Urine:425]  Incision/Wound:CDI soft sore   Lab Results:   Recent Labs  10/29/16 0740 10/30/16 0426  WBC 10.8* 10.4  HGB 12.1 10.7*  HCT 37.5 33.3*  PLT 300 248   BMET  Recent Labs  10/29/16 0740 10/30/16 0426  NA 143 139  K 4.3 4.1  CL 107 106  CO2 25 22  GLUCOSE 121* 130*  BUN 11 5*  CREATININE 0.74 0.55  CALCIUM 8.8* 8.1*   PT/INR No results for input(s): LABPROT, INR in the last 72 hours. ABG No results for input(s): PHART, HCO3 in the last 72 hours.  Invalid input(s): PCO2, PO2  Studies/Results: Dg Chest 2 View  Result Date: 10/28/2016 CLINICAL DATA:  Chest pain, vomiting EXAM: CHEST  2 VIEW COMPARISON:  None. FINDINGS: Linear atelectasis or scarring in the lingula. Right lung clear. Heart is upper limits normal in size. No effusions or acute bony abnormality. IMPRESSION: Lingular atelectasis or scarring.  No active disease. Electronically Signed   By: Rolm Baptise M.D.   On: 10/28/2016 13:39   US Abdomen Limited Ruq  Result Date: 10/28/2016 CLINICAL DATA:  Chest pain starting this morning. History of breast cancer. EXAM: US ABDOMEN LIMITED - RIGHT UPPER QUADRANT COMPARISON:  None. FINDINGS: Gallbladder: Multiple gallstones in the gallbladder measuring up to 2.4 cm in length. No gallbladder wall thickening. Sonographic Murphy's sign absent. Is also some sludge in the gallbladder. Common bile duct: Diameter: 5 mm Liver: Coarse echogenic  liver with poor sonic penetration compatible with diffuse hepatic steatosis. Mild fatty sparing along the gallbladder fossa. IMPRESSION: 1. Cholelithiasis and gallbladder sludge.  No biliary dilatation. 2. Coarse echogenic liver with poor sonic penetration compatible with diffuse hepatic steatosis. Electronically Signed   By: Van Clines M.D.   On: 10/28/2016 18:17    Anti-infectives: Anti-infectives    Start     Dose/Rate Route Frequency Ordered Stop   10/29/16 1315  piperacillin-tazobactam (ZOSYN) IVPB 3.375 g  Status:  Discontinued     3.375 g 12.5 mL/hr over 240 Minutes Intravenous To Surgery 10/29/16 1307 10/29/16 1538   10/28/16 2300  piperacillin-tazobactam (ZOSYN) IVPB 3.375 g     3.375 g 12.5 mL/hr over 240 Minutes Intravenous Every 8 hours 10/28/16 2258        Assessment/Plan: s/p Procedure(s): LAPAROSCOPIC CHOLECYSTECTOMY (N/A) Discharge home   LOS: 2 days    Tara Murphy A. 10/30/2016

## 2016-10-30 NOTE — Progress Notes (Signed)
Pt discharged home in stable condition after going over discharge instructions with no concerns voiced. AVS and discharge script given before leaving unit

## 2016-11-18 NOTE — Patient Instructions (Signed)
Tara Murphy  11/18/2016   Your procedure is scheduled on: 11-25-16  Report to [facility] Main  Entrance take [facility]  elevators to 3rd floor to  [place] at 630AM.  Call this number if you have problems the morning of surgery [phone]   Remember: ONLY 1 PERSON MAY GO WITH YOU TO SHORT STAY TO GET  READY MORNING OF [place].  Do not eat food or drink liquids :After Midnight.     Take these medicines the morning of surgery with A SIP OF WATER: gabapentin(neurontin), tylenol as needed, nasal spray as needed                                You may not have any metal on your body including hair pins and              piercings  Do not wear jewelry, make-up, lotions, powders or perfumes, deodorant             Do not wear nail polish.  Do not shave  48 hours prior to surgery.              Men may shave face and neck.   Do not bring valuables to the hospital. [place].  Contacts, dentures or bridgework may not be worn into surgery.  Leave suitcase in the car. After surgery it may be brought to your room.              Please read over the following fact sheets you were given: _____________________________________________________________________             [facility] - Preparing for Surgery Before surgery, you can play an important role.  Because skin is not sterile, your skin needs to be as free of germs as possible.  You can reduce the number of germs on your skin by washing with CHG (chlorahexidine gluconate) soap before surgery.  CHG is an antiseptic cleaner which kills germs and bonds with the skin to continue killing germs even after washing. Please DO NOT use if you have an allergy to CHG or antibacterial soaps.  If your skin becomes reddened/irritated stop using the CHG and inform your nurse when you arrive at Short Stay. Do not shave (including legs and underarms) for at least 48 hours prior to  the first CHG shower.  You may shave your face/neck. Please follow these instructions carefully:  1.  Shower with CHG Soap the night before surgery and the  morning of Surgery.  2.  If you choose to wash your hair, wash your hair first as usual with your  normal  shampoo.  3.  After you shampoo, rinse your hair and body thoroughly to remove the  shampoo.                           4.  Use CHG as you would any other liquid soap.  You can apply chg directly  to the skin and wash                       Gently with a scrungie or clean washcloth.  5.  Apply the CHG  Soap to your body ONLY FROM THE NECK DOWN.   Do not use on face/ open                           Wound or open sores. Avoid contact with eyes, ears mouth and genitals (private parts).                       Wash face,  Genitals (private parts) with your normal soap.             6.  Wash thoroughly, paying special attention to the area where your surgery  will be performed.  7.  Thoroughly rinse your body with warm water from the neck down.  8.  DO NOT shower/wash with your normal soap after using and rinsing off  the CHG Soap.                9.  Pat yourself dry with a clean towel.            10.  Wear clean pajamas.            11.  Place clean sheets on your bed the night of your first shower and do not  sleep with pets. Day of Surgery : Do not apply any lotions/deodorants the morning of surgery.  Please wear clean clothes to the hospital/surgery center.  FAILURE TO FOLLOW THESE INSTRUCTIONS MAY RESULT IN THE CANCELLATION OF YOUR SURGERY PATIENT SIGNATURE_________________________________  NURSE SIGNATURE__________________________________  ________________________________________________________________________   Adam Phenix  An incentive spirometer is a tool that can help keep your lungs clear and active. This tool measures how well you are filling your lungs with each breath. Taking long deep breaths may help reverse or  decrease the chance of developing breathing (pulmonary) problems (especially infection) following:  A long period of time when you are unable to move or be active. BEFORE THE PROCEDURE   If the spirometer includes an indicator to show your best effort, your nurse or respiratory therapist will set it to a desired goal.  If possible, sit up straight or lean slightly forward. Try not to slouch.  Hold the incentive spirometer in an upright position. INSTRUCTIONS FOR USE  1. Sit on the edge of your bed if possible, or sit up as far as you can in bed or on a chair. 2. Hold the incentive spirometer in an upright position. 3. Breathe out normally. 4. Place the mouthpiece in your mouth and seal your lips tightly around it. 5. Breathe in slowly and as deeply as possible, raising the piston or the ball toward the top of the column. 6. Hold your breath for 3-5 seconds or for as long as possible. Allow the piston or ball to fall to the bottom of the column. 7. Remove the mouthpiece from your mouth and breathe out normally. 8. Rest for a few seconds and repeat Steps 1 through 7 at least 10 times every 1-2 hours when you are awake. Take your time and take a few normal breaths between deep breaths. 9. The spirometer may include an indicator to show your best effort. Use the indicator as a goal to work toward during each repetition. 10. After each set of 10 deep breaths, practice coughing to be sure your lungs are clear. If you have an incision (the cut made at the time of surgery), support your incision when coughing by placing a pillow or rolled up towels  firmly against it. Once you are able to get out of bed, walk around indoors and cough well. You may stop using the incentive spirometer when instructed by your caregiver.  RISKS AND COMPLICATIONS  Take your time so you do not get dizzy or light-headed.  If you are in pain, you may need to take or ask for pain medication before doing incentive spirometry.  It is harder to take a deep breath if you are having pain. AFTER USE  Rest and breathe slowly and easily.  It can be helpful to keep track of a log of your progress. Your caregiver can provide you with a simple table to help with this. If you are using the spirometer at home, follow these instructions: [place] IF:   You are having difficultly using the spirometer.  You have trouble using the spirometer as often as instructed.  Your pain medication is not giving enough relief while using the spirometer.  You develop fever of 100.5 F (38.1 C) or higher. SEEK IMMEDIATE MEDICAL CARE IF:   You cough up bloody sputum that had not been present before.  You develop fever of 102 F (38.9 C) or greater.  You develop worsening pain at or near the incision site. MAKE SURE YOU:   Understand these instructions.  Will watch your condition.  Will get help right away if you are not doing well or get worse. Document Released: 01/20/2007 Document Revised: 12/02/2011 Document Reviewed: 03/23/2007 ExitCare Patient Information 2014 ExitCare, [place].   ________________________________________________________________________  WHAT IS A BLOOD TRANSFUSION? Blood Transfusion Information  A transfusion is the replacement of blood or some of its parts. Blood is made up of multiple cells which provide different functions.  Red blood cells carry oxygen and are used for blood loss replacement.  White blood cells fight against infection.  Platelets control bleeding.  Plasma helps clot blood.  Other blood products are available for specialized needs, such as hemophilia or other clotting disorders. BEFORE THE TRANSFUSION  Who gives blood for transfusions?   Healthy volunteers who are fully evaluated to make sure their blood is safe. This is blood bank blood. Transfusion therapy is the safest it has ever been in the practice of medicine. Before blood is taken from a donor, a complete  history is taken to make sure that person has no history of diseases nor engages in risky social behavior (examples are intravenous drug use or sexual activity with multiple partners). The donor's travel history is screened to minimize risk of transmitting infections, such as malaria. The donated blood is tested for signs of infectious diseases, such as HIV and hepatitis. The blood is then tested to be sure it is compatible with you in order to minimize the chance of a transfusion reaction. If you or a relative donates blood, this is often done in anticipation of surgery and is not appropriate for emergency situations. It takes many days to process the donated blood. RISKS AND COMPLICATIONS Although transfusion therapy is very safe and saves many lives, the main dangers of transfusion include:   Getting an infectious disease.  Developing a transfusion reaction. This is an allergic reaction to something in the blood you were given. Every precaution is taken to prevent this. The decision to have a blood transfusion has been considered carefully by your caregiver before blood is given. Blood is not given unless the benefits outweigh the risks. AFTER THE TRANSFUSION  Right after receiving a blood transfusion, you will usually feel much better  and more energetic. This is especially true if your red blood cells have gotten low (anemic). The transfusion raises the level of the red blood cells which carry oxygen, and this usually causes an energy increase.  The nurse administering the transfusion will monitor you carefully for complications. HOME CARE INSTRUCTIONS  No special instructions are needed after a transfusion. You may find your energy is better. Speak with your caregiver about any limitations on activity for underlying diseases you may have. SEEK MEDICAL CARE IF:   Your condition is not improving after your transfusion.  You develop redness or irritation at the intravenous (IV) site. SEEK  IMMEDIATE MEDICAL CARE IF:  Any of the following symptoms occur over the next 12 hours:  Shaking chills.  You have a temperature by mouth above 102 F (38.9 C), not controlled by medicine.  Chest, back, or muscle pain.  People around you feel you are not acting correctly or are confused.  Shortness of breath or difficulty breathing.  Dizziness and fainting.  You get a rash or develop hives.  You have a decrease in urine output.  Your urine turns a dark color or changes to pink, red, or brown. Any of the following symptoms occur over the next 10 days:  You have a temperature by mouth above 102 F (38.9 C), not controlled by medicine.  Shortness of breath.  Weakness after normal activity.  The white part of the eye turns yellow (jaundice).  You have a decrease in the amount of urine or are urinating less often.  Your urine turns a dark color or changes to pink, red, or brown. Document Released: 09/06/2000 Document Revised: 12/02/2011 Document Reviewed: 04/25/2008 [facility] Patient Information 2014 [place], [place].  _______________________________________________________________________

## 2016-11-18 NOTE — Progress Notes (Signed)
Medical clearance 09-26-16 D Andy epic CBC 10-30-16 epic CXR 10-28-16 epic Surgical PCR 10-29-16  ekg 10-29-16 epic

## 2016-11-19 ENCOUNTER — Encounter (HOSPITAL_COMMUNITY)
Admission: RE | Admit: 2016-11-19 | Discharge: 2016-11-19 | Disposition: A | Discharge status: Home / Self Care | Payer: Medicare Other | Source: Ambulatory Visit | Attending: Orthopedic Surgery | Admitting: Orthopedic Surgery

## 2016-11-19 ENCOUNTER — Encounter (HOSPITAL_COMMUNITY): Payer: Self-pay

## 2016-11-19 DIAGNOSIS — Z79899 Other long term (current) drug therapy: Secondary | ICD-10-CM | POA: Diagnosis not present

## 2016-11-19 DIAGNOSIS — M5136 Other intervertebral disc degeneration, lumbar region: Secondary | ICD-10-CM | POA: Diagnosis not present

## 2016-11-19 DIAGNOSIS — Z8249 Family history of ischemic heart disease and other diseases of the circulatory system: Secondary | ICD-10-CM | POA: Diagnosis not present

## 2016-11-19 DIAGNOSIS — R011 Cardiac murmur, unspecified: Secondary | ICD-10-CM | POA: Insufficient documentation

## 2016-11-19 DIAGNOSIS — Z87891 Personal history of nicotine dependence: Secondary | ICD-10-CM | POA: Diagnosis not present

## 2016-11-19 DIAGNOSIS — C50912 Malignant neoplasm of unspecified site of left female breast: Secondary | ICD-10-CM | POA: Diagnosis not present

## 2016-11-19 DIAGNOSIS — M25562 Pain in left knee: Secondary | ICD-10-CM | POA: Insufficient documentation

## 2016-11-19 DIAGNOSIS — E7800 Pure hypercholesterolemia, unspecified: Secondary | ICD-10-CM | POA: Insufficient documentation

## 2016-11-19 DIAGNOSIS — Z01812 Encounter for preprocedural laboratory examination: Secondary | ICD-10-CM | POA: Diagnosis not present

## 2016-11-19 DIAGNOSIS — M1712 Unilateral primary osteoarthritis, left knee: Secondary | ICD-10-CM | POA: Insufficient documentation

## 2016-11-19 LAB — COMPREHENSIVE METABOLIC PANEL
ALT: 19 U/L (ref 14–54)
AST: 18 U/L (ref 15–41)
Albumin: 4.4 g/dL (ref 3.5–5.0)
Alkaline Phosphatase: 98 U/L (ref 38–126)
Anion gap: 6 (ref 5–15)
BUN: 12 mg/dL (ref 6–20)
CHLORIDE: 108 mmol/L (ref 101–111)
CO2: 25 mmol/L (ref 22–32)
CREATININE: 0.57 mg/dL (ref 0.44–1.00)
Calcium: 9.1 mg/dL (ref 8.9–10.3)
GFR calc Af Amer: >60 mL/min (ref >60)
GFR calc non Af Amer: >60 mL/min (ref >60)
Glucose, Bld: 94 mg/dL (ref 65–99)
Potassium: 4.4 mmol/L (ref 3.5–5.1)
SODIUM: 139 mmol/L (ref 135–145)
Total Bilirubin: 0.4 mg/dL (ref 0.3–1.2)
Total Protein: 7.1 g/dL (ref 6.5–8.1)

## 2016-11-19 LAB — PROTIME-INR
INR: 0.89
Prothrombin Time: 12.1 seconds (ref 11.4–15.2)

## 2016-11-19 LAB — APTT: APTT: 31 s (ref 24–36)

## 2016-11-24 ENCOUNTER — Ambulatory Visit: Payer: Self-pay | Admitting: Orthopedic Surgery

## 2016-11-24 NOTE — H&P (Signed)
Tara Murphy DOB: 1946/04/07 Married / Language: English / Race: White Female Date of Admission:  11/25/2016 CC:  Left Knee Pain History of Present Illness The patient is a 71 year old female who comes in for a preoperative History and Physical. The patient is scheduled for a left total knee arthroplasty to be performed by Dr. Dione Plover. [name], MD at [facility] on 11/25/2016. The patient is a 71 year old female who presented with knee complaints. The patient was seen at [facility] in the past. The patient reports left knee symptoms including: pain (constant), swelling and pain increases at night which began 4 month(s) ago without any known injury. The patient describes the severity of the symptoms as severe. The patient describes their pain as aching, burning and stinging.The patient feels that the symptoms are worsening. The patient has the current diagnosis of knee osteoarthritis and medial meniscal tear. Previous work-up for this problem has included knee x-rays and knee MRI. Past treatment for this problem has included intra-articular injection of corticosteroids (tried cortisone and visco s/p 6 weeks ago: did not work). Symptoms are reported to be located in the left knee and include knee pain, medial knee pain, medial knee tenderness, swelling and difficulty bearing weight. The patient does not report any radiation of symptoms. Current treatment includes nonsteroidal anti-inflammatory drugs (Tramadol and Tylenol). Ms. Schlotter had previously underwent a right total hip arthroplasty. She did fantastic with that and has actually no pain in her right hip. Unfortunately, several months ago, she had sudden onset of significant discomfort in her left knee. She has had problems with that knee in the past, but this has gotten much worse. She said it was too difficult to get in to see Tara Murphy, so she went over to Dr. [name] office. She initially saw [name] and had a cortisone injection. This did  not help, so she subsequently had an MRI. She was told she had arthritis and a torn meniscus. She was sent to Dr. [name] who told her about the pathology on the MRI and told her that she would eventually need to have the knee replaced. She was seen in the clinic for a second opinion. She had decided that she wanted to proceed with surgery for the knee. They have been treated conservatively in the past for the above stated problem and despite conservative measures, they continue to have progressive pain and severe functional limitations and dysfunction. They have failed non-operative management including home exercise, medications, and injections. It is felt that they would benefit from undergoing total joint replacement. Risks and benefits of the procedure have been discussed with the patient and they elect to proceed with surgery. There are no active contraindications to surgery such as ongoing infection or rapidly progressive neurological disease.   Problem List/Past Medical  Lumbar DDD (M51.36)  Acute hip pain, right (M25.551)  Primary osteoarthritis of left knee (M17.12)  Primary osteoarthritis of right hip (M16.11)  Breast Cancer  Left-sided Hypercholesterolemia  Hypothyroidism  Past History Osteoarthritis  Menopause  Urinary Tract Infection  Past Hsitory of C.Diff   Allergies  Sulfa Drugs  Rash. HYDROmorphone HCl *ANALGESICS - OPIOID*  Nausea. FentaNYL *ANALGESICS - OPIOID*  Flushing Terbinafine *DERMATOLOGICALS*  Iodinated Contrast  Rash, Hives. Diclofenac Potassium *ANALGESICS - ANTI-INFLAMMATORY*  Aleve *ANALGESICS - ANTI-INFLAMMATORY*  Facial Rash NSAIDs   Family History Cancer  Father. Chronic Obstructive Lung Disease  Mother. Congestive Heart Failure  Mother. Drug / Alcohol Addiction  Father. First Degree Relatives  reported Heart disease in female family member before age 37  Osteoarthritis  Brother, Father, Mother, Sister. child  Social  History Children  2 Current drinker  09/19/2015: Currently drinks wine 8-14 times per week Current work status  retired Furniture conservator/restorer weekly; does gym / [employer] Living situation  live with spouse Marital status  married No history of drug/alcohol rehab  Not under pain contract  Number of flights of stairs before winded  2-3 Tobacco / smoke exposure  09/19/2015: no Tobacco use  Former smoker. 09/19/2015: smoke(d) 3 more pack(s) per day Advance Directives  Living Will Post-Surgical Plans  Hom  Medication History  Clobetasol Propionate (0.05% Solution, External) Active. ClonazePAM (0.'5MG'$  Tablet, Oral) Active. Fluticasone Propionate (Nasal) (50MCG/DOSE Inhaler, Nasal) Active. Gabapentin ('300MG'$  Tablet, Oral) Active. (Dr Regino Bellow) TraMADol HCl ('50MG'$  Tablet, Oral) Active.  Past Surgical History Appendectomy  Date: 2002. Total Hip Replacement - Right [12/18/2015]: Breast Mass; Local Excision  Date: 09/2013. left Cataract Surgery  bilateral Colon Polyp Removal - Colonoscopy  Total Hip Replacement  Date: 2007. left Total Knee Replacement  Date: 2011. right Tubal Ligation  Root Canal    Review of Systems  General Not Present- Chills, Fatigue, Fever, Memory Loss, Night Sweats, Weight Gain and Weight Loss. Skin Not Present- Eczema, Hives, Itching, Lesions and Rash. HEENT Not Present- Dentures, Double Vision, Headache, Hearing Loss, Tinnitus and Visual Loss. Respiratory Not Present- Allergies, Chronic Cough, Coughing up blood, Shortness of breath at rest and Shortness of breath with exertion. Cardiovascular Not Present- Chest Pain, Difficulty Breathing Lying Down, Murmur, Palpitations, Racing/skipping heartbeats and Swelling. Gastrointestinal Not Present- Abdominal Pain, Bloody Stool, Constipation, Diarrhea, Difficulty Swallowing, Heartburn, Jaundice, Loss of appetitie, Nausea and Vomiting. Female Genitourinary Not Present- Blood in Urine, Discharge, Flank  Pain, Incontinence, Painful Urination, Urgency, Urinary frequency, Urinary Retention, Urinating at Night and Weak urinary stream. Musculoskeletal Present- Joint Pain. Not Present- Back Pain, Joint Swelling, Morning Stiffness, Muscle Pain, Muscle Weakness and Spasms. Neurological Not Present- Blackout spells, Difficulty with balance, Dizziness, Paralysis, Tremor and Weakness. Psychiatric Not Present- Insomnia.  Vitals  Weight: 190 lb Height: 66in Weight was reported by patient. Height was reported by patient. Body Surface Area: 1.96 m Body Mass Index: 30.67 kg/m  Pulse: 76 (Regular)  BP: 144/82 (Sitting, Left Arm, Standard)   Physical Exam General Mental Status -Alert, cooperative and good historian. General Appearance-pleasant, Not in acute distress. Orientation-Oriented X3. Build & Nutrition-Well nourished and Well developed.  Head and Neck Head-normocephalic, atraumatic . Neck Global Assessment - supple, no bruit auscultated on the right, no bruit auscultated on the left.  Eye Pupil - Bilateral-Regular and Round. Motion - Bilateral-EOMI.  Chest and Lung Exam Auscultation Breath sounds - clear at anterior chest wall and clear at posterior chest wall. Adventitious sounds - No Adventitious sounds.  Cardiovascular Auscultation Rhythm - Regular rate and rhythm. Heart Sounds - S1 WNL and S2 WNL. Murmurs & Other Heart Sounds: Murmur 1 - Location - Aortic Area and Pulmonic Area. Timing - Early systolic. Grade - II/VI. Character - Low pitched.  Abdomen Palpation/Percussion Tenderness - Abdomen is non-tender to palpation. Rigidity (guarding) - Abdomen is soft. Auscultation Auscultation of the abdomen reveals - Bowel sounds normal.  Female Genitourinary Note: Not done, not pertinent to present illness   Musculoskeletal Note: On exam, well-developed female, alert and oriented, in no apparent distress. She does not have pain on range of motion of her  left hip, which is flexion to about 110, rotation in 20 and out 30, abduction  30. Her left knee shows no effusion. Range about 5 to 130. There is marked crepitus on range of motion of the knee. She is tender, medial greater than lateral, with no instability noted. Her right knee, which has been replaced, has range about 0 to 115, but no tenderness or instability.  RADIOGRAPHS Radiographs are reviewed from the other orthopedic office and she has got significant medial joint space narrowing, but has severe bone-on-bone change in the patellofemoral joint. She also has an MRI done, which confirms a severe patellofemoral arthritis.   Assessment & Plan Primary osteoarthritis of left knee (M17.12)  Note:Surgical Plans: Left Total Knee Replacement  Disposition: Home and straight to outpatient therapy at [facility].  PCP: Dr. Jonni Sanger  Topical TXA - Breast Cancer  Anesthesia Issues: Does not tolerate general very well.  Patient was instructed on what medications to stop prior to surgery.  Signed electronically by Ok Edwards, III PA-C

## 2016-11-25 ENCOUNTER — Encounter (HOSPITAL_COMMUNITY): Payer: Self-pay | Admitting: *Deleted

## 2016-11-25 ENCOUNTER — Encounter (HOSPITAL_COMMUNITY)
Admission: RE | Disposition: A | Discharge status: Home / Self Care | Payer: Self-pay | Source: Ambulatory Visit | Attending: Orthopedic Surgery

## 2016-11-25 ENCOUNTER — Inpatient Hospital Stay (HOSPITAL_COMMUNITY)
Admission: RE | Admit: 2016-11-25 | Discharge: 2016-11-27 | DRG: 470 | Disposition: A | Discharge status: Home / Self Care | Payer: Medicare Other | Source: Ambulatory Visit | Attending: Orthopedic Surgery | Admitting: Orthopedic Surgery

## 2016-11-25 ENCOUNTER — Inpatient Hospital Stay (HOSPITAL_COMMUNITY): Payer: Medicare Other | Admitting: Anesthesiology

## 2016-11-25 DIAGNOSIS — G8918 Other acute postprocedural pain: Secondary | ICD-10-CM | POA: Diagnosis not present

## 2016-11-25 DIAGNOSIS — Z885 Allergy status to narcotic agent status: Secondary | ICD-10-CM

## 2016-11-25 DIAGNOSIS — Z87891 Personal history of nicotine dependence: Secondary | ICD-10-CM

## 2016-11-25 DIAGNOSIS — Z853 Personal history of malignant neoplasm of breast: Secondary | ICD-10-CM

## 2016-11-25 DIAGNOSIS — L280 Lichen simplex chronicus: Secondary | ICD-10-CM | POA: Diagnosis present

## 2016-11-25 DIAGNOSIS — Z882 Allergy status to sulfonamides status: Secondary | ICD-10-CM

## 2016-11-25 DIAGNOSIS — Z91041 Radiographic dye allergy status: Secondary | ICD-10-CM | POA: Diagnosis not present

## 2016-11-25 DIAGNOSIS — Z7951 Long term (current) use of inhaled steroids: Secondary | ICD-10-CM | POA: Diagnosis not present

## 2016-11-25 DIAGNOSIS — Z79899 Other long term (current) drug therapy: Secondary | ICD-10-CM

## 2016-11-25 DIAGNOSIS — M25562 Pain in left knee: Secondary | ICD-10-CM | POA: Diagnosis not present

## 2016-11-25 DIAGNOSIS — M179 Osteoarthritis of knee, unspecified: Secondary | ICD-10-CM | POA: Diagnosis present

## 2016-11-25 DIAGNOSIS — C50512 Malignant neoplasm of lower-outer quadrant of left female breast: Secondary | ICD-10-CM | POA: Diagnosis not present

## 2016-11-25 DIAGNOSIS — Z79891 Long term (current) use of opiate analgesic: Secondary | ICD-10-CM

## 2016-11-25 DIAGNOSIS — M25762 Osteophyte, left knee: Secondary | ICD-10-CM | POA: Diagnosis present

## 2016-11-25 DIAGNOSIS — Z886 Allergy status to analgesic agent status: Secondary | ICD-10-CM | POA: Diagnosis not present

## 2016-11-25 DIAGNOSIS — M1712 Unilateral primary osteoarthritis, left knee: Principal | ICD-10-CM | POA: Diagnosis present

## 2016-11-25 DIAGNOSIS — E7800 Pure hypercholesterolemia, unspecified: Secondary | ICD-10-CM | POA: Diagnosis present

## 2016-11-25 DIAGNOSIS — Z888 Allergy status to other drugs, medicaments and biological substances status: Secondary | ICD-10-CM

## 2016-11-25 DIAGNOSIS — Z87440 Personal history of urinary (tract) infections: Secondary | ICD-10-CM

## 2016-11-25 DIAGNOSIS — K8000 Calculus of gallbladder with acute cholecystitis without obstruction: Secondary | ICD-10-CM | POA: Diagnosis not present

## 2016-11-25 DIAGNOSIS — M1710 Unilateral primary osteoarthritis, unspecified knee: Secondary | ICD-10-CM | POA: Diagnosis present

## 2016-11-25 HISTORY — PX: TOTAL KNEE ARTHROPLASTY: SHX125

## 2016-11-25 LAB — TYPE AND SCREEN
ABO/RH(D): O POS
ANTIBODY SCREEN: NEGATIVE

## 2016-11-25 SURGERY — ARTHROPLASTY, KNEE, TOTAL
Anesthesia: Spinal | Site: Knee | Laterality: Left

## 2016-11-25 MED ORDER — BUPIVACAINE LIPOSOME 1.3 % IJ SUSP
20.0000 mL | Freq: Once | INTRAMUSCULAR | Status: DC
  Filled 2016-11-25: qty 20

## 2016-11-25 MED ORDER — PHENYLEPHRINE 40 MCG/ML (10ML) SYRINGE FOR IV PUSH (FOR BLOOD PRESSURE SUPPORT)
PREFILLED_SYRINGE | INTRAVENOUS | Status: AC
  Filled 2016-11-25: qty 10

## 2016-11-25 MED ORDER — PROPOFOL 500 MG/50ML IV EMUL
INTRAVENOUS | Status: DC | PRN
  Administered 2016-11-25: 10 ug/kg/min via INTRAVENOUS

## 2016-11-25 MED ORDER — SODIUM CHLORIDE 0.9 % IJ SOLN
INTRAMUSCULAR | Status: AC
  Filled 2016-11-25: qty 50

## 2016-11-25 MED ORDER — MORPHINE SULFATE (PF) 4 MG/ML IV SOLN: 1.0000 mg | INTRAVENOUS | Status: DC | PRN

## 2016-11-25 MED ORDER — CHLORHEXIDINE GLUCONATE 4 % EX LIQD: 60.0000 mL | Freq: Once | CUTANEOUS | Status: DC

## 2016-11-25 MED ORDER — TRANEXAMIC ACID 1000 MG/10ML IV SOLN: 2000.0000 mg | Freq: Once | INTRAVENOUS | Status: DC

## 2016-11-25 MED ORDER — METOCLOPRAMIDE HCL 5 MG PO TABS: 5.0000 mg | ORAL_TABLET | Freq: Three times a day (TID) | ORAL | Status: DC | PRN

## 2016-11-25 MED ORDER — MIDAZOLAM HCL 5 MG/5ML IJ SOLN
INTRAMUSCULAR | Status: DC | PRN
  Administered 2016-11-25: 0.5 mg via INTRAVENOUS

## 2016-11-25 MED ORDER — FENTANYL CITRATE (PF) 100 MCG/2ML IJ SOLN
INTRAMUSCULAR | Status: AC
  Administered 2016-11-25: 50 ug via INTRAVENOUS
  Filled 2016-11-25: qty 2

## 2016-11-25 MED ORDER — ONDANSETRON HCL 4 MG/2ML IJ SOLN
INTRAMUSCULAR | Status: AC
  Filled 2016-11-25: qty 2

## 2016-11-25 MED ORDER — DEXAMETHASONE SODIUM PHOSPHATE 10 MG/ML IJ SOLN
10.0000 mg | Freq: Once | INTRAMUSCULAR | Status: AC
  Administered 2016-11-25: 10 mg via INTRAVENOUS

## 2016-11-25 MED ORDER — TRANEXAMIC ACID 1000 MG/10ML IV SOLN
2000.0000 mg | Freq: Once | INTRAVENOUS | Status: DC
  Filled 2016-11-25: qty 20

## 2016-11-25 MED ORDER — MIDAZOLAM HCL 2 MG/2ML IJ SOLN
INTRAMUSCULAR | Status: AC
  Filled 2016-11-25: qty 2

## 2016-11-25 MED ORDER — TRANEXAMIC ACID 1000 MG/10ML IV SOLN
INTRAVENOUS | Status: DC | PRN
  Administered 2016-11-25: 2000 mg via TOPICAL

## 2016-11-25 MED ORDER — DEXAMETHASONE SODIUM PHOSPHATE 10 MG/ML IJ SOLN
10.0000 mg | Freq: Once | INTRAMUSCULAR | Status: AC
Start: 2016-11-26 — End: 2016-11-26
  Administered 2016-11-26: 10 mg via INTRAVENOUS
  Filled 2016-11-25: qty 1

## 2016-11-25 MED ORDER — GABAPENTIN 300 MG PO CAPS
600.0000 mg | ORAL_CAPSULE | Freq: Two times a day (BID) | ORAL | Status: DC
  Administered 2016-11-25 – 2016-11-27 (×4): 600 mg via ORAL
  Filled 2016-11-25 (×4): qty 2

## 2016-11-25 MED ORDER — RIVAROXABAN 10 MG PO TABS
10.0000 mg | ORAL_TABLET | Freq: Every day | ORAL | Status: DC
  Administered 2016-11-26 – 2016-11-27 (×2): 10 mg via ORAL
  Filled 2016-11-25 (×2): qty 1

## 2016-11-25 MED ORDER — DIPHENHYDRAMINE HCL 12.5 MG/5ML PO ELIX: 12.5000 mg | ORAL_SOLUTION | ORAL | Status: DC | PRN

## 2016-11-25 MED ORDER — PHENYLEPHRINE HCL 10 MG/ML IJ SOLN
INTRAMUSCULAR | Status: DC | PRN
  Administered 2016-11-25: 40 ug via INTRAVENOUS
  Administered 2016-11-25: 80 ug via INTRAVENOUS
  Administered 2016-11-25: 40 ug via INTRAVENOUS
  Administered 2016-11-25: 80 ug via INTRAVENOUS
  Administered 2016-11-25 (×2): 40 ug via INTRAVENOUS
  Administered 2016-11-25: 80 ug via INTRAVENOUS
  Administered 2016-11-25: 40 ug via INTRAVENOUS

## 2016-11-25 MED ORDER — PROPOFOL 10 MG/ML IV BOLUS
INTRAVENOUS | Status: AC
  Filled 2016-11-25: qty 40

## 2016-11-25 MED ORDER — BISACODYL 10 MG RE SUPP: 10.0000 mg | Freq: Every day | RECTAL | Status: DC | PRN

## 2016-11-25 MED ORDER — ACETAMINOPHEN 500 MG PO TABS
1000.0000 mg | ORAL_TABLET | Freq: Four times a day (QID) | ORAL | Status: AC
  Administered 2016-11-25 – 2016-11-26 (×4): 1000 mg via ORAL
  Filled 2016-11-25 (×4): qty 2

## 2016-11-25 MED ORDER — ONDANSETRON HCL 4 MG/2ML IJ SOLN: 4.0000 mg | Freq: Four times a day (QID) | INTRAMUSCULAR | Status: DC | PRN

## 2016-11-25 MED ORDER — LACTATED RINGERS IV SOLN
INTRAVENOUS | Status: DC
  Administered 2016-11-25 (×2): via INTRAVENOUS

## 2016-11-25 MED ORDER — SODIUM CHLORIDE 0.9 % IV SOLN
INTRAVENOUS | Status: DC
Start: 2016-11-25 — End: 2016-11-27
  Administered 2016-11-25: 75 mL/h via INTRAVENOUS
  Administered 2016-11-26: via INTRAVENOUS

## 2016-11-25 MED ORDER — CEFAZOLIN SODIUM-DEXTROSE 2-4 GM/100ML-% IV SOLN
2.0000 g | INTRAVENOUS | Status: AC
  Administered 2016-11-25: 2 g via INTRAVENOUS
  Filled 2016-11-25: qty 100

## 2016-11-25 MED ORDER — DOCUSATE SODIUM 100 MG PO CAPS
100.0000 mg | ORAL_CAPSULE | Freq: Two times a day (BID) | ORAL | Status: DC
Start: 2016-11-25 — End: 2016-11-27
  Administered 2016-11-25 – 2016-11-27 (×4): 100 mg via ORAL
  Filled 2016-11-25 (×4): qty 1

## 2016-11-25 MED ORDER — POLYETHYLENE GLYCOL 3350 17 G PO PACK: 17.0000 g | PACK | Freq: Every day | ORAL | Status: DC | PRN

## 2016-11-25 MED ORDER — HYDROMORPHONE HCL 1 MG/ML IJ SOLN: 0.2500 mg | INTRAMUSCULAR | Status: DC | PRN

## 2016-11-25 MED ORDER — CLONAZEPAM 0.5 MG PO TABS: 0.5000 mg | ORAL_TABLET | Freq: Every evening | ORAL | Status: DC | PRN

## 2016-11-25 MED ORDER — PROPOFOL 10 MG/ML IV BOLUS
INTRAVENOUS | Status: DC | PRN
Start: 2016-11-25 — End: 2016-11-25
  Administered 2016-11-25 (×3): 20 mg via INTRAVENOUS

## 2016-11-25 MED ORDER — DEXAMETHASONE SODIUM PHOSPHATE 10 MG/ML IJ SOLN
INTRAMUSCULAR | Status: AC
  Filled 2016-11-25: qty 1

## 2016-11-25 MED ORDER — ONDANSETRON HCL 4 MG/2ML IJ SOLN
INTRAMUSCULAR | Status: DC | PRN
  Administered 2016-11-25: 4 mg via INTRAVENOUS

## 2016-11-25 MED ORDER — MIDAZOLAM HCL 2 MG/2ML IJ SOLN
2.0000 mg | Freq: Once | INTRAMUSCULAR | Status: AC
  Administered 2016-11-25: 2 mg via INTRAVENOUS

## 2016-11-25 MED ORDER — PHENOL 1.4 % MT LIQD
1.0000 | OROMUCOSAL | Status: DC | PRN
  Filled 2016-11-25: qty 177

## 2016-11-25 MED ORDER — ACETAMINOPHEN 650 MG RE SUPP: 650.0000 mg | Freq: Four times a day (QID) | RECTAL | Status: DC | PRN

## 2016-11-25 MED ORDER — DEXTROSE 5 % IV SOLN
500.0000 mg | Freq: Four times a day (QID) | INTRAVENOUS | Status: DC | PRN
  Administered 2016-11-25: 500 mg via INTRAVENOUS
  Filled 2016-11-25: qty 5
  Filled 2016-11-25: qty 550

## 2016-11-25 MED ORDER — METOCLOPRAMIDE HCL 5 MG/ML IJ SOLN
5.0000 mg | Freq: Three times a day (TID) | INTRAMUSCULAR | Status: DC | PRN
Start: 2016-11-25 — End: 2016-11-27

## 2016-11-25 MED ORDER — FLEET ENEMA 7-19 GM/118ML RE ENEM: 1.0000 | ENEMA | Freq: Once | RECTAL | Status: DC | PRN

## 2016-11-25 MED ORDER — FENTANYL CITRATE (PF) 100 MCG/2ML IJ SOLN
25.0000 ug | INTRAMUSCULAR | Status: DC | PRN
  Administered 2016-11-25 (×2): 50 ug via INTRAVENOUS

## 2016-11-25 MED ORDER — ACETAMINOPHEN 10 MG/ML IV SOLN
1000.0000 mg | Freq: Once | INTRAVENOUS | Status: AC
  Administered 2016-11-25: 1000 mg via INTRAVENOUS
  Filled 2016-11-25: qty 100

## 2016-11-25 MED ORDER — METHOCARBAMOL 500 MG PO TABS
500.0000 mg | ORAL_TABLET | Freq: Four times a day (QID) | ORAL | Status: DC | PRN
  Administered 2016-11-25 – 2016-11-27 (×4): 500 mg via ORAL
  Filled 2016-11-25 (×4): qty 1

## 2016-11-25 MED ORDER — ACETAMINOPHEN 325 MG PO TABS
650.0000 mg | ORAL_TABLET | Freq: Four times a day (QID) | ORAL | Status: DC | PRN
  Administered 2016-11-27: 10:00:00 650 mg via ORAL
  Filled 2016-11-25: qty 2

## 2016-11-25 MED ORDER — OXYCODONE HCL 5 MG PO TABS
5.0000 mg | ORAL_TABLET | ORAL | Status: DC | PRN
  Administered 2016-11-25 – 2016-11-27 (×15): 10 mg via ORAL
  Filled 2016-11-25 (×9): qty 2
  Filled 2016-11-25: qty 1
  Filled 2016-11-25 (×6): qty 2

## 2016-11-25 MED ORDER — HYDROMORPHONE HCL 1 MG/ML IJ SOLN
0.2500 mg | INTRAMUSCULAR | Status: DC | PRN
Start: 2016-11-25 — End: 2016-11-25

## 2016-11-25 MED ORDER — BUPIVACAINE LIPOSOME 1.3 % IJ SUSP
INTRAMUSCULAR | Status: DC | PRN
  Administered 2016-11-25: 20 mL

## 2016-11-25 MED ORDER — CEFAZOLIN SODIUM-DEXTROSE 2-4 GM/100ML-% IV SOLN
2.0000 g | Freq: Four times a day (QID) | INTRAVENOUS | Status: AC
  Administered 2016-11-25 (×2): 2 g via INTRAVENOUS
  Filled 2016-11-25 (×2): qty 100

## 2016-11-25 MED ORDER — ONDANSETRON HCL 4 MG PO TABS: 4.0000 mg | ORAL_TABLET | Freq: Four times a day (QID) | ORAL | Status: DC | PRN

## 2016-11-25 MED ORDER — MENTHOL 3 MG MT LOZG: 1.0000 | LOZENGE | OROMUCOSAL | Status: DC | PRN

## 2016-11-25 MED ORDER — SODIUM CHLORIDE 0.9 % IJ SOLN
INTRAMUSCULAR | Status: DC | PRN
  Administered 2016-11-25: 30 mL

## 2016-11-25 MED ORDER — FLUTICASONE PROPIONATE 50 MCG/ACT NA SUSP
1.0000 | Freq: Every day | NASAL | Status: DC | PRN
Start: 2016-11-26 — End: 2016-11-27
  Filled 2016-11-25: qty 16

## 2016-11-25 SURGICAL SUPPLY — 54 items
BAG DECANTER FOR FLEXI CONT (MISCELLANEOUS) ×3 IMPLANT
BAG ZIPLOCK 12X15 (MISCELLANEOUS) ×3 IMPLANT
BANDAGE ACE 6X5 VEL STRL LF (GAUZE/BANDAGES/DRESSINGS) ×3 IMPLANT
BLADE HEX COATED 2.75 (ELECTRODE) ×3 IMPLANT
BLADE SAG 18X100X1.27 (BLADE) ×3 IMPLANT
BLADE SAW SGTL 11.0X1.19X90.0M (BLADE) ×3 IMPLANT
BOWL SMART MIX CTS (DISPOSABLE) ×3 IMPLANT
CAPT KNEE TOTAL 3 ATTUNE ×3 IMPLANT
CEMENT HV SMART SET (Cement) ×6 IMPLANT
CLOSURE WOUND 1/2 X4 (GAUZE/BANDAGES/DRESSINGS) ×2
CLOTH BEACON ORANGE TIMEOUT ST (SAFETY) ×3 IMPLANT
CUFF TOURN SGL QUICK 34 (TOURNIQUET CUFF) ×2
CUFF TRNQT CYL 34X4X40X1 (TOURNIQUET CUFF) ×1 IMPLANT
DECANTER SPIKE VIAL GLASS SM (MISCELLANEOUS) ×3 IMPLANT
DRAPE U-SHAPE 47X51 STRL (DRAPES) ×3 IMPLANT
DRSG ADAPTIC 3X8 NADH LF (GAUZE/BANDAGES/DRESSINGS) ×3 IMPLANT
DRSG PAD ABDOMINAL 8X10 ST (GAUZE/BANDAGES/DRESSINGS) ×3 IMPLANT
DURAPREP 26ML APPLICATOR (WOUND CARE) ×3 IMPLANT
ELECT REM PT RETURN 9FT ADLT (ELECTROSURGICAL) ×3
ELECTRODE REM PT RTRN 9FT ADLT (ELECTROSURGICAL) ×1 IMPLANT
EVACUATOR 1/8 PVC DRAIN (DRAIN) ×3 IMPLANT
GAUZE SPONGE 4X4 12PLY STRL (GAUZE/BANDAGES/DRESSINGS) ×3 IMPLANT
GLOVE BIO SURGEON STRL SZ7.5 (GLOVE) IMPLANT
GLOVE BIO SURGEON STRL SZ8 (GLOVE) ×3 IMPLANT
GLOVE BIOGEL PI IND STRL 6.5 (GLOVE) ×1 IMPLANT
GLOVE BIOGEL PI IND STRL 8 (GLOVE) ×1 IMPLANT
GLOVE BIOGEL PI INDICATOR 6.5 (GLOVE) ×2
GLOVE BIOGEL PI INDICATOR 8 (GLOVE) ×2
GLOVE SURG SS PI 6.5 STRL IVOR (GLOVE) ×3 IMPLANT
GOWN STRL REUS W/TWL LRG LVL3 (GOWN DISPOSABLE) ×6 IMPLANT
GOWN STRL REUS W/TWL XL LVL3 (GOWN DISPOSABLE) IMPLANT
HANDPIECE INTERPULSE COAX TIP (DISPOSABLE) ×2
IMMOBILIZER KNEE 20 (SOFTGOODS) ×3
IMMOBILIZER KNEE 20 THIGH 36 (SOFTGOODS) ×1 IMPLANT
MANIFOLD NEPTUNE II (INSTRUMENTS) ×3 IMPLANT
NS IRRIG 1000ML POUR BTL (IV SOLUTION) ×3 IMPLANT
PACK TOTAL KNEE CUSTOM (KITS) ×3 IMPLANT
PADDING CAST COTTON 6X4 STRL (CAST SUPPLIES) ×6 IMPLANT
POSITIONER SURGICAL ARM (MISCELLANEOUS) ×3 IMPLANT
SET HNDPC FAN SPRY TIP SCT (DISPOSABLE) ×1 IMPLANT
STRIP CLOSURE SKIN 1/2X4 (GAUZE/BANDAGES/DRESSINGS) ×4 IMPLANT
SUT MNCRL AB 4-0 PS2 18 (SUTURE) ×3 IMPLANT
SUT STRATAFIX 0 PDS 27 VIOLET (SUTURE) ×3
SUT VIC AB 2-0 CT1 27 (SUTURE) ×6
SUT VIC AB 2-0 CT1 TAPERPNT 27 (SUTURE) ×3 IMPLANT
SUT VLOC 180 0 24IN GS25 (SUTURE) IMPLANT
SUTURE STRATFX 0 PDS 27 VIOLET (SUTURE) ×1 IMPLANT
SYR 50ML LL SCALE MARK (SYRINGE) ×3 IMPLANT
TAPE STRIPS DRAPE STRL (GAUZE/BANDAGES/DRESSINGS) ×3 IMPLANT
TRAY FOLEY CATH 14FRSI W/METER (CATHETERS) ×3 IMPLANT
TRAY FOLEY W/METER SILVER 16FR (SET/KITS/TRAYS/PACK) IMPLANT
WATER STERILE IRR 1000ML POUR (IV SOLUTION) ×6 IMPLANT
WRAP KNEE MAXI GEL POST OP (GAUZE/BANDAGES/DRESSINGS) ×3 IMPLANT
YANKAUER SUCT BULB TIP 10FT TU (MISCELLANEOUS) ×3 IMPLANT

## 2016-11-25 NOTE — Anesthesia Procedure Notes (Signed)
Anesthesia Regional Block: Adductor canal block   Pre-Anesthetic Checklist: ,, timeout performed, Correct Patient, Correct Site, Correct Laterality, Correct Procedure, Correct Position, site marked, Risks and benefits discussed,  Surgical consent,  Pre-op evaluation,  At surgeon's request and post-op pain management  Laterality: Left  Prep: chloraprep       Needles:  Injection technique: Single-shot  Needle Type: Stimulator Needle - 80          Additional Needles:   Procedures: Doppler guided, Ultrasound guided,,,,,,  Narrative:  Start time: 11/25/2016 8:25 AM End time: 11/25/2016 8:40 AM Injection made incrementally with aspirations every 5 mL.  Performed by: Personally  Anesthesiologist: Belinda Block

## 2016-11-25 NOTE — Evaluation (Signed)
Physical Therapy Evaluation Patient Details Name: Stacie Templin MRN: [id number] DOB: 10-Jul-1946 Today's Date: 11/25/2016   History of Present Illness  71 yo female s/p L TKA 11/25/16.  Hx of R THA-DA, R TKA  Clinical Impression  On eval POD 0, pt required Min assist for mobility. She walked ~40 feet with a RW. Pain rated 5/10 with activity. Ambulation distance limited due to drop in BP-72/50 while walking. RN made aware. Will continue to follow and progress activity as tolerated.     Follow Up Recommendations Outpatient PT;Supervision for mobility/OOB    Equipment Recommendations  None recommended by PT    Recommendations for Other Services       Precautions / Restrictions Precautions Precautions: Fall Required Braces or Orthoses: Knee Immobilizer - Left Knee Immobilizer - Left: Discontinue once straight leg raise with < 10 degree lag Restrictions Weight Bearing Restrictions: No LLE Weight Bearing: Weight bearing as tolerated      Mobility  Bed Mobility Overal bed mobility: Needs Assistance Bed Mobility: Supine to Sit     Supine to sit: Min guard     General bed mobility comments: close guard for safety.   Transfers Overall transfer level: Needs assistance Equipment used: Rolling walker (2 wheeled) Transfers: Sit to/from Stand Sit to Stand: Min guard         General transfer comment: close guard for safety.  Ambulation/Gait Ambulation/Gait assistance: Min guard Ambulation Distance (Feet): 40 Feet Assistive device: Rolling walker (2 wheeled) Gait Pattern/deviations: Step-to pattern;Decreased stride length     General Gait Details: close guard for safety. Pt c/o feeling "woozy, sweaty and tunnel vision". Distance limited for safety reasons. Assisted pt into recliner. BP 72/50.   Stairs            Wheelchair Mobility    Modified Rankin (Stroke Patients Only)       Balance                                             Pertinent  Vitals/Pain Pain Assessment: 0-10 Pain Score: 5  Pain Location: L knee Pain Descriptors / Indicators: Aching;Sore Pain Intervention(s): Monitored during session;Repositioned;Ice applied    Home Living Family/patient expects to be discharged to:: Private residence Living Arrangements: Spouse/significant other Available Help at Discharge: Family Type of Home: House Home Access: Level entry     Home Layout: One Macy: Shower seat;Grab bars - toilet;Walker - 2 wheels      Prior Function Level of Independence: Needs assistance   Gait / Transfers Assistance Needed: independent  ADL's / Homemaking Assistance Needed: husband assisted with socks/shoe        Hand Dominance        Extremity/Trunk Assessment   Upper Extremity Assessment Upper Extremity Assessment: Defer to OT evaluation    Lower Extremity Assessment Lower Extremity Assessment: Generalized weakness (s/p L TKA)    Cervical / Trunk Assessment Cervical / Trunk Assessment: Normal  Communication   Communication: No difficulties  Cognition Arousal/Alertness: Awake/alert Behavior During Therapy: WFL for tasks assessed/performed Overall Cognitive Status: Within Functional Limits for tasks assessed                      General Comments      Exercises     Assessment/Plan    PT Assessment Patient needs continued PT services  PT Problem List Decreased strength;Decreased  mobility;Decreased range of motion;Decreased activity tolerance;Decreased balance;Decreased knowledge of use of DME;Pain       PT Treatment Interventions DME instruction;Therapeutic activities;Gait training;Therapeutic exercise;Functional mobility training;Balance training    PT Goals (Current goals can be found in the Care Plan section)  Acute Rehab PT Goals Patient Stated Goal: to regain independence PT Goal Formulation: With patient/family Time For Goal Achievement: 12/09/16 Potential to Achieve Goals: Good     Frequency 7X/week   Barriers to discharge        Co-evaluation               End of Session Equipment Utilized During Treatment: Gait belt Activity Tolerance:  (Limited by drop in BP) Patient left: in chair;with call bell/phone within reach;with family/visitor present   PT Visit Diagnosis: Difficulty in walking, not elsewhere classified (R26.2)         Time: 1165-7903 PT Time Calculation (min) (ACUTE ONLY): 17 min   Charges:   PT Evaluation $PT Eval Low Complexity: 1 Procedure     PT G Codes:         [name], MPT Pager: [phone]

## 2016-11-25 NOTE — Anesthesia Preprocedure Evaluation (Signed)
Anesthesia Evaluation  Patient identified by MRN, date of birth, ID band Patient awake    Reviewed: Allergy & Precautions, NPO status , Patient's Chart, lab work & pertinent test results  Airway Mallampati: II  TM Distance: >3 FB     Dental   Pulmonary former smoker,    breath sounds clear to auscultation       Cardiovascular negative cardio ROS   Rhythm:Regular Rate:Normal     Neuro/Psych    GI/Hepatic negative GI ROS, Neg liver ROS,   Endo/Other  negative endocrine ROS  Renal/GU negative Renal ROS     Musculoskeletal  (+) Arthritis ,   Abdominal   Peds  Hematology   Anesthesia Other Findings   Reproductive/Obstetrics                             Anesthesia Physical Anesthesia Plan  ASA: II  Anesthesia Plan: Spinal   Post-op Pain Management:  Regional for Post-op pain   Induction: Intravenous  Airway Management Planned: Simple Face Mask  Additional Equipment:   Intra-op Plan:   Post-operative Plan:   Informed Consent: I have reviewed the patients History and Physical, chart, labs and discussed the procedure including the risks, benefits and alternatives for the proposed anesthesia with the patient or authorized representative who has indicated his/her understanding and acceptance.   Dental advisory given  Plan Discussed with: CRNA and Anesthesiologist  Anesthesia Plan Comments:         Anesthesia Quick Evaluation

## 2016-11-25 NOTE — Op Note (Signed)
OPERATIVE REPORT-TOTAL KNEE ARTHROPLASTY   Pre-operative diagnosis- Osteoarthritis Left knee(s)  Post-operative diagnosis- Osteoarthritis Left knee(s)  Procedure-  Left  Total Knee Arthroplasty  Surgeon- Dione Plover. Mccoy Testa, MD  Assistant- Ardeen Jourdain, PA-C   Anesthesia-  Adductor canal block and spinal  EBL-* No blood loss amount entered *   Drains Hemovac  Tourniquet time-  Total Tourniquet Time Documented: Thigh (Left) - 29 minutes Total: Thigh (Left) - 29 minutes     Complications- None  Condition-PACU - hemodynamically stable.   Brief Clinical Note  Tara Murphy is a 71 y.o. year old female with end stage OA of her left knee with progressively worsening pain and dysfunction. She has constant pain, with activity and at rest and significant functional deficits with difficulties even with ADLs. She has had extensive non-op management including analgesics, injections of cortisone and viscosupplements, and home exercise program, but remains in significant pain with significant dysfunction. Radiographs show bone on bone arthritis medial and patellofemoral. She presents now for left Total Knee Arthroplasty.    Procedure in detail---   The patient is brought into the operating room and positioned supine on the operating table. After successful administration of  Adductor canal block and spinal,   a tourniquet is placed high on the  Left thigh(s) and the lower extremity is prepped and draped in the usual sterile fashion. Time out is performed by the operating team and then the  Left lower extremity is wrapped in Esmarch, knee flexed and the tourniquet inflated to 300 mmHg.       A midline incision is made with a ten blade through the subcutaneous tissue to the level of the extensor mechanism. A fresh blade is used to make a medial parapatellar arthrotomy. Soft tissue over the proximal medial tibia is subperiosteally elevated to the joint line with a knife and into the  semimembranosus bursa with a Cobb elevator. Soft tissue over the proximal lateral tibia is elevated with attention being paid to avoiding the patellar tendon on the tibial tubercle. The patella is everted, knee flexed 90 degrees and the ACL and PCL are removed. Findings are bone on bone medial and patellofemoral with large medial osteophytes.        The drill is used to create a starting hole in the distal femur and the canal is thoroughly irrigated with sterile saline to remove the fatty contents. The 5 degree Left  valgus alignment guide is placed into the femoral canal and the distal femoral cutting block is pinned to remove 9 mm off the distal femur. Resection is made with an oscillating saw.      The tibia is subluxed forward and the menisci are removed. The extramedullary alignment guide is placed referencing proximally at the medial aspect of the tibial tubercle and distally along the second metatarsal axis and tibial crest. The block is pinned to remove 37m off the more deficient medial  side. Resection is made with an oscillating saw. Size 5is the most appropriate size for the tibia and the proximal tibia is prepared with the modular drill and keel punch for that size.      The femoral sizing guide is placed and size 6 is most appropriate. Rotation is marked off the epicondylar axis and confirmed by creating a rectangular flexion gap at 90 degrees. The size 6 cutting block is pinned in this rotation and the anterior, posterior and chamfer cuts are made with the oscillating saw. The intercondylar block is then placed and that cut  is made.      Trial size 5 tibial component, trial size 6 narrow posterior stabilized femur and a 6  mm posterior stabilized rotating platform insert trial is placed. Full extension is achieved with excellent varus/valgus and anterior/posterior balance throughout full range of motion. The patella is everted and thickness measured to be 21  mm. Free hand resection is taken to 12  mm, a 35 template is placed, lug holes are drilled, trial patella is placed, and it tracks normally. Osteophytes are removed off the posterior femur with the trial in place. All trials are removed and the cut bone surfaces prepared with pulsatile lavage. Cement is mixed and once ready for implantation, the size 5 tibial implant, size  6 narrow posterior stabilized femoral component, and the size 35 patella are cemented in place and the patella is held with the clamp. The trial insert is placed and the knee held in full extension. The Exparel (20 ml mixed with 30 ml saline) and .25% Bupivicaine, are injected into the extensor mechanism, posterior capsule, medial and lateral gutters and subcutaneous tissues.  All extruded cement is removed and once the cement is hard the permanent 6 mm posterior stabilized rotating platform insert is placed into the tibial tray.      The wound is copiously irrigated with saline solution and the extensor mechanism closed over a hemovac drain with #1 V-loc suture. The tourniquet is released for a total tourniquet time of 29  minutes. Flexion against gravity is 140 degrees and the patella tracks normally. Subcutaneous tissue is closed with 2.0 vicryl and subcuticular with running 4.0 Monocryl. The incision is cleaned and dried and steri-strips and a bulky sterile dressing are applied. The limb is placed into a knee immobilizer and the patient is awakened and transported to recovery in stable condition.      Please note that a surgical assistant was a medical necessity for this procedure in order to perform it in a safe and expeditious manner. Surgical assistant was necessary to retract the ligaments and vital neurovascular structures to prevent injury to them and also necessary for proper positioning of the limb to allow for anatomic placement of the prosthesis.   Dione Plover Henson Fraticelli, MD    11/25/2016, 10:16 AM

## 2016-11-25 NOTE — Anesthesia Procedure Notes (Signed)
Anesthesia Procedure Note     

## 2016-11-25 NOTE — Progress Notes (Signed)
Assisted Dr. Green with left, ultrasound guided, adductor canal block. Side rails up, monitors on throughout procedure. See vital signs in flow sheet. Tolerated Procedure well.  

## 2016-11-25 NOTE — Interval H&P Note (Signed)
History and Physical Interval Note:  11/25/2016 6:55 AM  Tara Murphy  has presented today for surgery, with the diagnosis of left knee osteoarthritis  The various methods of treatment have been discussed with the patient and family. After consideration of risks, benefits and other options for treatment, the patient has consented to  Procedure(s): LEFT TOTAL KNEE ARTHROPLASTY (Left) as a surgical intervention .  The patient's history has been reviewed, patient examined, no change in status, stable for surgery.  I have reviewed the patient's chart and labs.  Questions were answered to the patient's satisfaction.     [name]

## 2016-11-25 NOTE — H&P (View-Only) (Signed)
Tara Murphy DOB: 1946-05-02 Married / Language: English / Race: White Female Date of Admission:  11/25/2016 CC:  Left Knee Pain History of Present Illness The patient is a 71 year old female who comes in for a preoperative History and Physical. The patient is scheduled for a left total knee arthroplasty to be performed by Dr. Dione Plover. [name], MD at [facility] on 11/25/2016. The patient is a 71 year old female who presented with knee complaints. The patient was seen at [name] in the past. The patient reports left knee symptoms including: pain (constant), swelling and pain increases at night which began 4 month(s) ago without any known injury. The patient describes the severity of the symptoms as severe. The patient describes their pain as aching, burning and stinging.The patient feels that the symptoms are worsening. The patient has the current diagnosis of knee osteoarthritis and medial meniscal tear. Previous work-up for this problem has included knee x-rays and knee MRI. Past treatment for this problem has included intra-articular injection of corticosteroids (tried cortisone and visco s/p 6 weeks ago: did not work). Symptoms are reported to be located in the left knee and include knee pain, medial knee pain, medial knee tenderness, swelling and difficulty bearing weight. The patient does not report any radiation of symptoms. Current treatment includes nonsteroidal anti-inflammatory drugs (Tramadol and Tylenol). Ms. Willits had previously underwent a right total hip arthroplasty. She did fantastic with that and has actually no pain in her right hip. Unfortunately, several months ago, she had sudden onset of significant discomfort in her left knee. She has had problems with that knee in the past, but this has gotten much worse. She said it was too difficult to get in to see [name], so she went over to Dr. [name] office. She initially saw [name] and had a cortisone injection. This did  not help, so she subsequently had an MRI. She was told she had arthritis and a torn meniscus. She was sent to Dr. [name] who told her about the pathology on the MRI and told her that she would eventually need to have the knee replaced. She was seen in the clinic for a second opinion. She had decided that she wanted to proceed with surgery for the knee. They have been treated conservatively in the past for the above stated problem and despite conservative measures, they continue to have progressive pain and severe functional limitations and dysfunction. They have failed non-operative management including home exercise, medications, and injections. It is felt that they would benefit from undergoing total joint replacement. Risks and benefits of the procedure have been discussed with the patient and they elect to proceed with surgery. There are no active contraindications to surgery such as ongoing infection or rapidly progressive neurological disease.   Problem List/Past Medical  Lumbar DDD (M51.36)  Acute hip pain, right (M25.551)  Primary osteoarthritis of left knee (M17.12)  Primary osteoarthritis of right hip (M16.11)  Breast Cancer  Left-sided Hypercholesterolemia  Hypothyroidism  Past History Osteoarthritis  Menopause  Urinary Tract Infection  Past Hsitory of C.Diff   Allergies  Sulfa Drugs  Rash. HYDROmorphone HCl *ANALGESICS - OPIOID*  Nausea. FentaNYL *ANALGESICS - OPIOID*  Flushing Terbinafine *DERMATOLOGICALS*  Iodinated Contrast  Rash, Hives. Diclofenac Potassium *ANALGESICS - ANTI-INFLAMMATORY*  Aleve *ANALGESICS - ANTI-INFLAMMATORY*  Facial Rash NSAIDs   Family History Cancer  Father. Chronic Obstructive Lung Disease  Mother. Congestive Heart Failure  Mother. Drug / Alcohol Addiction  Father. First Degree Relatives  reported Heart disease in female family member before age 43  Osteoarthritis  Brother, Father, Mother, Sister. child  Social  History Children  2 Current drinker  09/19/2015: Currently drinks wine 8-14 times per week Current work status  retired Furniture conservator/restorer weekly; does gym / [employer] Living situation  live with spouse Marital status  married No history of drug/alcohol rehab  Not under pain contract  Number of flights of stairs before winded  2-3 Tobacco / smoke exposure  09/19/2015: no Tobacco use  Former smoker. 09/19/2015: smoke(d) 3 more pack(s) per day Advance Directives  Living Will Post-Surgical Plans  Hom  Medication History  Clobetasol Propionate (0.05% Solution, External) Active. ClonazePAM (0.'5MG'$  Tablet, Oral) Active. Fluticasone Propionate (Nasal) (50MCG/DOSE Inhaler, Nasal) Active. Gabapentin ('300MG'$  Tablet, Oral) Active. (Dr Regino Bellow) TraMADol HCl ('50MG'$  Tablet, Oral) Active.  Past Surgical History Appendectomy  Date: 2002. Total Hip Replacement - Right [12/18/2015]: Breast Mass; Local Excision  Date: 09/2013. left Cataract Surgery  bilateral Colon Polyp Removal - Colonoscopy  Total Hip Replacement  Date: 2007. left Total Knee Replacement  Date: 2011. right Tubal Ligation  Root Canal    Review of Systems  General Not Present- Chills, Fatigue, Fever, Memory Loss, Night Sweats, Weight Gain and Weight Loss. Skin Not Present- Eczema, Hives, Itching, Lesions and Rash. HEENT Not Present- Dentures, Double Vision, Headache, Hearing Loss, Tinnitus and Visual Loss. Respiratory Not Present- Allergies, Chronic Cough, Coughing up blood, Shortness of breath at rest and Shortness of breath with exertion. Cardiovascular Not Present- Chest Pain, Difficulty Breathing Lying Down, Murmur, Palpitations, Racing/skipping heartbeats and Swelling. Gastrointestinal Not Present- Abdominal Pain, Bloody Stool, Constipation, Diarrhea, Difficulty Swallowing, Heartburn, Jaundice, Loss of appetitie, Nausea and Vomiting. Female Genitourinary Not Present- Blood in Urine, Discharge, Flank  Pain, Incontinence, Painful Urination, Urgency, Urinary frequency, Urinary Retention, Urinating at Night and Weak urinary stream. Musculoskeletal Present- Joint Pain. Not Present- Back Pain, Joint Swelling, Morning Stiffness, Muscle Pain, Muscle Weakness and Spasms. Neurological Not Present- Blackout spells, Difficulty with balance, Dizziness, Paralysis, Tremor and Weakness. Psychiatric Not Present- Insomnia.  Vitals  Weight: 190 lb Height: 66in Weight was reported by patient. Height was reported by patient. Body Surface Area: 1.96 m Body Mass Index: 30.67 kg/m  Pulse: 76 (Regular)  BP: 144/82 (Sitting, Left Arm, Standard)   Physical Exam General Mental Status -Alert, cooperative and good historian. General Appearance-pleasant, Not in acute distress. Orientation-Oriented X3. Build & Nutrition-Well nourished and Well developed.  Head and Neck Head-normocephalic, atraumatic . Neck Global Assessment - supple, no bruit auscultated on the right, no bruit auscultated on the left.  Eye Pupil - Bilateral-Regular and Round. Motion - Bilateral-EOMI.  Chest and Lung Exam Auscultation Breath sounds - clear at anterior chest wall and clear at posterior chest wall. Adventitious sounds - No Adventitious sounds.  Cardiovascular Auscultation Rhythm - Regular rate and rhythm. Heart Sounds - S1 WNL and S2 WNL. Murmurs & Other Heart Sounds: Murmur 1 - Location - Aortic Area and Pulmonic Area. Timing - Early systolic. Grade - II/VI. Character - Low pitched.  Abdomen Palpation/Percussion Tenderness - Abdomen is non-tender to palpation. Rigidity (guarding) - Abdomen is soft. Auscultation Auscultation of the abdomen reveals - Bowel sounds normal.  Female Genitourinary Note: Not done, not pertinent to present illness   Musculoskeletal Note: On exam, well-developed female, alert and oriented, in no apparent distress. She does not have pain on range of motion of her  left hip, which is flexion to about 110, rotation in 20 and out 30, abduction  30. Her left knee shows no effusion. Range about 5 to 130. There is marked crepitus on range of motion of the knee. She is tender, medial greater than lateral, with no instability noted. Her right knee, which has been replaced, has range about 0 to 115, but no tenderness or instability.  RADIOGRAPHS Radiographs are reviewed from the other orthopedic office and she has got significant medial joint space narrowing, but has severe bone-on-bone change in the patellofemoral joint. She also has an MRI done, which confirms a severe patellofemoral arthritis.   Assessment & Plan Primary osteoarthritis of left knee (M17.12)  Note:Surgical Plans: Left Total Knee Replacement  Disposition: Home and straight to outpatient therapy at [facility].  PCP: Dr. Jonni Sanger  Topical TXA - Breast Cancer  Anesthesia Issues: Does not tolerate general very well.  Patient was instructed on what medications to stop prior to surgery.  Signed electronically by Ok Edwards, III PA-C

## 2016-11-25 NOTE — Anesthesia Procedure Notes (Signed)
Spinal  Start time: 11/25/2016 9:10 AM End time: 11/25/2016 9:20 AM Staffing Anesthesiologist: Belinda Block Performed: anesthesiologist  Preanesthetic Checklist Completed: patient identified, site marked, surgical consent, pre-op evaluation, timeout performed, IV checked, risks and benefits discussed and monitors and equipment checked Spinal Block Patient position: sitting Prep: DuraPrep Patient monitoring: heart rate, cardiac monitor, continuous pulse ox and blood pressure Approach: midline Injection technique: single-shot Needle Needle type: Quincke  Needle gauge: 22 G Assessment Sensory level: T10 Additional Notes Spinal marcaine '13mg'$ . Expiration checked.Clear CSF.  Patient tolerated procedure well. Patient with no complaints.

## 2016-11-25 NOTE — Transfer of Care (Signed)
Immediate Anesthesia Transfer of Care Note  Patient: Tara Murphy  Procedure(s) Performed: Procedure(s) with comments: LEFT TOTAL KNEE ARTHROPLASTY (Left) - with abductor block  Patient Location: PACU  Anesthesia Type:MAC, Regional and Spinal  Level of Consciousness: Patient easily awoken, sedated, comfortable, cooperative, following commands, responds to stimulation.   Airway & Oxygen Therapy: Patient spontaneously breathing, ventilating well, oxygen via simple oxygen mask.  Post-op Assessment: Report given to PACU RN, vital signs reviewed and stable.   Post vital signs: Reviewed and stable.  Complications: No apparent anesthesia complications Last Vitals:  Vitals:   11/25/16 0850 11/25/16 0855  BP: 124/60 123/63  Pulse: 69 64  Resp: 17 18  Temp:      Last Pain:  Vitals:   11/25/16 0707  TempSrc:   PainSc: 1       Patients Stated Pain Goal: 3 (41/03/01 3143)  Complications: No apparent anesthesia complications

## 2016-11-26 LAB — BASIC METABOLIC PANEL
ANION GAP: 6 (ref 5–15)
BUN: 11 mg/dL (ref 6–20)
CALCIUM: 8.7 mg/dL — AB (ref 8.9–10.3)
CO2: 23 mmol/L (ref 22–32)
Chloride: 111 mmol/L (ref 101–111)
Creatinine, Ser: 0.67 mg/dL (ref 0.44–1.00)
GFR calc Af Amer: >60 mL/min (ref >60)
GFR calc non Af Amer: >60 mL/min (ref >60)
GLUCOSE: 123 mg/dL — AB (ref 65–99)
Potassium: 4.5 mmol/L (ref 3.5–5.1)
Sodium: 140 mmol/L (ref 135–145)

## 2016-11-26 LAB — CBC
HEMATOCRIT: 31.3 % — AB (ref 36.0–46.0)
Hemoglobin: 10.4 g/dL — ABNORMAL LOW (ref 12.0–15.0)
MCH: 29.8 pg (ref 26.0–34.0)
MCHC: 33.2 g/dL (ref 30.0–36.0)
MCV: 89.7 fL (ref 78.0–100.0)
Platelets: 256 10*3/uL (ref 150–400)
RBC: 3.49 MIL/uL — ABNORMAL LOW (ref 3.87–5.11)
RDW: 12.8 % (ref 11.5–15.5)
WBC: 10.2 10*3/uL (ref 4.0–10.5)

## 2016-11-26 MED ORDER — METHOCARBAMOL 500 MG PO TABS: 500.0000 mg | ORAL_TABLET | Freq: Four times a day (QID) | ORAL | 0 refills | Status: DC | PRN

## 2016-11-26 MED ORDER — OXYCODONE HCL 5 MG PO TABS: 5.0000 mg | ORAL_TABLET | ORAL | 0 refills | Status: DC | PRN

## 2016-11-26 MED ORDER — RIVAROXABAN 10 MG PO TABS: 10.0000 mg | ORAL_TABLET | Freq: Every day | ORAL | 0 refills | Status: DC

## 2016-11-26 MED ORDER — SODIUM CHLORIDE 0.9 % IV BOLUS (SEPSIS): 250.0000 mL | Freq: Once | INTRAVENOUS | Status: DC

## 2016-11-26 MED ORDER — TRAMADOL HCL 50 MG PO TABS: 50.0000 mg | ORAL_TABLET | Freq: Four times a day (QID) | ORAL | 1 refills | Status: DC | PRN

## 2016-11-26 NOTE — Discharge Instructions (Addendum)
° °Dr. Frank [name] °Total Joint Specialist °[facility] °[address] °[place], [place] [address] °[phone] ° °TOTAL KNEE REPLACEMENT POSTOPERATIVE DIRECTIONS ° °Knee Rehabilitation, Guidelines Following Surgery  °Results after knee surgery are often greatly improved when you follow the exercise, range of motion and muscle strengthening exercises prescribed by your doctor. Safety measures are also important to protect the knee from further injury. Any time any of these exercises cause you to have increased pain or swelling in your knee joint, decrease the amount until you are comfortable again and slowly increase them. If you have problems or questions, call your caregiver or physical therapist for advice.  ° °HOME CARE INSTRUCTIONS  °Remove items at home which could result in a fall. This includes throw rugs or furniture in walking pathways.  °· ICE to the affected knee every three hours for 30 minutes at a time and then as needed for pain and swelling.  Continue to use ice on the knee for pain and swelling from surgery. You may notice swelling that will progress down to the foot and ankle.  This is normal after surgery.  Elevate the leg when you are not up walking on it.   °· Continue to use the breathing machine which will help keep your temperature down.  It is common for your temperature to cycle up and down following surgery, especially at night when you are not up moving around and exerting yourself.  The breathing machine keeps your lungs expanded and your temperature down. °· Do not place pillow under knee, focus on keeping the knee straight while resting ° °DIET °You may resume your previous home diet once your are discharged from the hospital. ° °DRESSING / WOUND CARE / SHOWERING °You may shower 3 days after surgery, but keep the wounds dry during showering.  You may use an occlusive plastic wrap (Press'n Seal for example), NO SOAKING/SUBMERGING IN THE BATHTUB.  If the  bandage gets wet, change with a clean dry gauze.  If the incision gets wet, pat the wound dry with a clean towel. °You may start showering once you are discharged home but do not submerge the incision under water. Just pat the incision dry and apply a dry gauze dressing on daily. °Change the surgical dressing daily and reapply a dry dressing each time. ° °ACTIVITY °Walk with your walker as instructed. °Use walker as long as suggested by your caregivers. °Avoid periods of inactivity such as sitting longer than an hour when not asleep. This helps prevent blood clots.  °You may resume a sexual relationship in one month or when given the OK by your doctor.  °You may return to work once you are cleared by your doctor.  °Do not drive a car for 6 weeks or until released by you surgeon.  °Do not drive while taking narcotics. ° °WEIGHT BEARING °Weight bearing as tolerated with assist device (walker, cane, etc) as directed, use it as long as suggested by your surgeon or therapist, typically at least 4-6 weeks. ° °POSTOPERATIVE CONSTIPATION PROTOCOL °Constipation - defined medically as fewer than three stools per week and severe constipation as less than one stool per week. ° °One of the most common issues patients have following surgery is constipation.  Even if you have a regular bowel pattern at home, your normal regimen is likely to be disrupted due to multiple reasons following surgery.  Combination of anesthesia, postoperative narcotics, change in appetite and fluid intake all can affect your bowels.    In order to avoid complications following surgery, here are some recommendations in order to help you during your recovery period. ° °Colace (docusate) - Pick up an over-the-counter form of Colace or another stool softener and take twice a day as long as you are requiring postoperative pain medications.  Take with a full glass of water daily.  If you experience loose stools or diarrhea, hold the colace until you stool forms  back up.  If your symptoms do not get better within 1 week or if they get worse, check with your doctor. ° °Dulcolax (bisacodyl) - Pick up over-the-counter and take as directed by the product packaging as needed to assist with the movement of your bowels.  Take with a full glass of water.  Use this product as needed if not relieved by Colace only.  ° °MiraLax (polyethylene glycol) - Pick up over-the-counter to have on hand.  MiraLax is a solution that will increase the amount of water in your bowels to assist with bowel movements.  Take as directed and can mix with a glass of water, juice, soda, coffee, or tea.  Take if you go more than two days without a movement. °Do not use MiraLax more than once per day. Call your doctor if you are still constipated or irregular after using this medication for 7 days in a row. ° °If you continue to have problems with postoperative constipation, please contact the office for further assistance and recommendations.  If you experience "the worst abdominal pain ever" or develop nausea or vomiting, please contact the office immediatly for further recommendations for treatment. ° °ITCHING ° If you experience itching with your medications, try taking only a single pain pill, or even half a pain pill at a time.  You can also use Benadryl over the counter for itching or also to help with sleep.  ° °TED HOSE STOCKINGS °Wear the elastic stockings on both legs for three weeks following surgery during the day but you may remove then at night for sleeping. ° °MEDICATIONS °See your medication summary on the “After Visit Summary” that the nursing staff will review with you prior to discharge.  You may have some home medications which will be placed on hold until you complete the course of blood thinner medication.  It is important for you to complete the blood thinner medication as prescribed by your surgeon.  Continue your approved medications as instructed at time of  discharge. ° °PRECAUTIONS °If you experience chest pain or shortness of breath - call 911 immediately for transfer to the hospital emergency department.  °If you develop a fever greater that 101 F, purulent drainage from wound, increased redness or drainage from wound, foul odor from the wound/dressing, or calf pain - CONTACT YOUR SURGEON.   °                                                °FOLLOW-UP APPOINTMENTS °Make sure you keep all of your appointments after your operation with your surgeon and caregivers. You should call the office at the above phone number and make an appointment for approximately two weeks after the date of your surgery or on the date instructed by your surgeon outlined in the "After Visit Summary". ° ° °RANGE OF MOTION AND STRENGTHENING EXERCISES  °Rehabilitation of the knee is important following a knee injury or   an operation. After just a few days of immobilization, the muscles of the thigh which control the knee become weakened and shrink (atrophy). Knee exercises are designed to build up the tone and strength of the thigh muscles and to improve knee motion. Often times heat used for twenty to thirty minutes before working out will loosen up your tissues and help with improving the range of motion but do not use heat for the first two weeks following surgery. These exercises can be done on a training (exercise) mat, on the floor, on a table or on a bed. Use what ever works the best and is most comfortable for you Knee exercises include:  Leg Lifts - While your knee is still immobilized in a splint or cast, you can do straight leg raises. Lift the leg to 60 degrees, hold for 3 sec, and slowly lower the leg. Repeat 10-20 times 2-3 times daily. Perform this exercise against resistance later as your knee gets better.  Quad and Hamstring Sets - Tighten up the muscle on the front of the thigh (Quad) and hold for 5-10 sec. Repeat this 10-20 times hourly. Hamstring sets are done by pushing the  foot backward against an object and holding for 5-10 sec. Repeat as with quad sets.   Leg Slides: Lying on your back, slowly slide your foot toward your buttocks, bending your knee up off the floor (only go as far as is comfortable). Then slowly slide your foot back down until your leg is flat on the floor again.  Angel Wings: Lying on your back spread your legs to the side as far apart as you can without causing discomfort.  A rehabilitation program following serious knee injuries can speed recovery and prevent re-injury in the future due to weakened muscles. Contact your doctor or a physical therapist for more information on knee rehabilitation.   IF YOU ARE TRANSFERRED TO A SKILLED REHAB FACILITY If the patient is transferred to a skilled rehab facility following release from the hospital, a list of the current medications will be sent to the facility for the patient to continue.  When discharged from the skilled rehab facility, please have the facility set up the patient's [place] prior to being released. Also, the skilled facility will be responsible for providing the patient with their medications at time of release from the facility to include their pain medication, the muscle relaxants, and their blood thinner medication. If the patient is still at the rehab facility at time of the two week follow up appointment, the skilled rehab facility will also need to assist the patient in arranging follow up appointment in our office and any transportation needs.  MAKE SURE YOU:  Understand these instructions.  Get help right away if you are not doing well or get worse.    Pick up stool softner and laxative for home use following surgery while on pain medications. Do not submerge incision under water. Please use good hand washing techniques while changing dressing each day. May shower starting three days after surgery. Please use a clean towel to pat the incision dry following  showers. Continue to use ice for pain and swelling after surgery. Do not use any lotions or creams on the incision until instructed by your surgeon.  Take Xarelto for two and a half more weeks following discharge from the hospital, then discontinue Xarelto. Once the patient has completed the blood thinner regimen, then take a Baby 81 mg Aspirin daily for three  more weeks.    Information on my medicine - XARELTO (Rivaroxaban)  This medication education was reviewed with me or my healthcare representative as part of my discharge preparation.    Why was Xarelto prescribed for you? Xarelto was prescribed for you to reduce the risk of blood clots forming after orthopedic surgery. The medical term for these abnormal blood clots is venous thromboembolism (VTE).  What do you need to know about xarelto ? Take your Xarelto ONCE DAILY at the same time every day. You may take it either with or without food.  If you have difficulty swallowing the tablet whole, you may crush it and mix in applesauce just prior to taking your dose.  Take Xarelto exactly as prescribed by your doctor and DO NOT stop taking Xarelto without talking to the doctor who prescribed the medication.  Stopping without other VTE prevention medication to take the place of Xarelto may increase your risk of developing a clot.  After discharge, you should have regular check-up appointments with your healthcare provider that is prescribing your Xarelto.    What do you do if you miss a dose? If you miss a dose, take it as soon as you remember on the same day then continue your regularly scheduled once daily regimen the next day. Do not take two doses of Xarelto on the same day.   Important Safety Information A possible side effect of Xarelto is bleeding. You should call your healthcare provider right away if you experience any of the following: ? Bleeding from an injury or your nose that does not stop. ? Unusual colored  urine (red or dark brown) or unusual colored stools (red or black). ? Unusual bruising for unknown reasons. ? A serious fall or if you hit your head (even if there is no bleeding).  Some medicines may interact with Xarelto and might increase your risk of bleeding while on Xarelto. To help avoid this, consult your healthcare provider or pharmacist prior to using any new prescription or non-prescription medications, including herbals, vitamins, non-steroidal anti-inflammatory drugs (NSAIDs) and supplements.  This website has more information on Xarelto: [email or web address]

## 2016-11-26 NOTE — Progress Notes (Signed)
OT Cancellation Note  Patient Details Name: Tara Murphy MRN: [id number] DOB: 15-Sep-1946   Cancelled Treatment:    Reason Eval/Treat Not Completed: OT screened, no needs identified, will sign off  [place], Thereasa Parkin 11/26/2016, 10:43 AM

## 2016-11-26 NOTE — Progress Notes (Signed)
Physical Therapy Treatment Patient Details Name: Tara Murphy MRN: [id number] DOB: Jan 10, 1946 Today's Date: 11/26/2016    History of Present Illness 71 yo female s/p L TKA 11/25/16.  Hx of R THA-DA, R TKA    PT Comments    POD # 1 pm session Assisted with amb a greater distance in hallway.  BP 135/54 with decreased c/o of "light headed".  Assisted back to bed for CPM.  Follow Up Recommendations  Outpatient PT;Supervision for mobility/OOB     Equipment Recommendations  None recommended by PT    Recommendations for Other Services       Precautions / Restrictions Precautions Precautions: Fall Precaution Comments: instructed on KI use for amb and stairs Required Braces or Orthoses: Knee Immobilizer - Left Restrictions Weight Bearing Restrictions: No LLE Weight Bearing: Weight bearing as tolerated    Mobility  Bed Mobility Overal bed mobility: Needs Assistance Bed Mobility: Supine to Sit     Supine to sit: Min guard     General bed mobility comments: assist L LE and increased time   Transfers Overall transfer level: Needs assistance Equipment used: Rolling walker (2 wheeled) Transfers: Sit to/from Stand Sit to Stand: Min guard         General transfer comment: 25% VC's on proper hand placement and L LE advancement.  Ambulation/Gait Ambulation/Gait assistance: Min guard Ambulation Distance (Feet): 42 Feet Assistive device: Rolling walker (2 wheeled) Gait Pattern/deviations: Step-to pattern;Decreased stride length Gait velocity: decreased   General Gait Details: 25% VC's on proper sequencing, walker to self distance and safety with turns.  Mild c/o "light headed".  BP amb 133/67   Stairs            Wheelchair Mobility    Modified Rankin (Stroke Patients Only)       Balance                                    Cognition Arousal/Alertness: Awake/alert Behavior During Therapy: WFL for tasks assessed/performed Overall Cognitive  Status: Within Functional Limits for tasks assessed                      Exercises      General Comments        Pertinent Vitals/Pain Pain Assessment: 0-10 Pain Score: 3  Pain Location: L knee Pain Descriptors / Indicators: Aching;Sore Pain Intervention(s): Monitored during session;Repositioned;Ice applied    Home Living                      Prior Function            PT Goals (current goals can now be found in the care plan section) Progress towards PT goals: Progressing toward goals    Frequency    7X/week      PT Plan Current plan remains appropriate    Co-evaluation             End of Session Equipment Utilized During Treatment: Gait belt Activity Tolerance: Patient tolerated treatment well Patient left: in chair;with call bell/phone within reach;with family/visitor present   PT Visit Diagnosis: Difficulty in walking, not elsewhere classified (R26.2)     Time: 3212-2482 PT Time Calculation (min) (ACUTE ONLY): 26 min  Charges:  $Gait Training: 8-22 mins $Therapeutic Activity: 8-22 mins  G Codes:       Rica Koyanagi  PTA [facility]  Acute  Rehab Pager      [phone]

## 2016-11-26 NOTE — Progress Notes (Signed)
   Subjective: 1 Day Post-Op Procedure(s) (LRB): LEFT TOTAL KNEE ARTHROPLASTY (Left) Patient reports pain as mild and moderate.   Patient seen in rounds for Dr. Wynelle Link. Patient is well, but has had some minor complaints of pain in the knee, requiring pain medications We will resume therapy today.  Plan is to go Home after hospital stay.  Objective: Vital signs in last 24 hours: Temp:  [97.4 F (36.3 C)-99.5 F (37.5 C)] 99.5 F (37.5 C) (03/06 1358) Pulse Rate:  [56-66] 62 (03/06 1358) Resp:  [16-20] 18 (03/06 1358) BP: (93-112)/(47-64) 109/55 (03/06 1358) SpO2:  [96 %-100 %] 97 % (03/06 1358)  Intake/Output from previous day:  Intake/Output Summary (Last 24 hours) at 11/26/16 2023 Last data filed at 11/26/16 1800  Gross per 24 hour  Intake          3458.75 ml  Output             4660 ml  Net         -1201.25 ml    Intake/Output this shift: No intake/output data recorded.  Labs:  Recent Labs  11/26/16 0531  HGB 10.4*    Recent Labs  11/26/16 0531  WBC 10.2  RBC 3.49*  HCT 31.3*  PLT 256    Recent Labs  11/26/16 0531  NA 140  K 4.5  CL 111  CO2 23  BUN 11  CREATININE 0.67  GLUCOSE 123*  CALCIUM 8.7*   No results for input(s): LABPT, INR in the last 72 hours.  EXAM General - Patient is Alert, Appropriate and Oriented Extremity - Neurovascular intact Sensation intact distally Intact pulses distally Dorsiflexion/Plantar flexion intact Dressing - dressing C/D/I Motor Function - intact, moving foot and toes well on exam.  Hemovac pulled without difficulty.  Past Medical History:  Diagnosis Date  . Arthritis   . Breast cancer ([place])    left   . Cholecystolithiasis   . Lichen plano-pilaris   . Urinary tract infection    hx of frequent     Assessment/Plan: 1 Day Post-Op Procedure(s) (LRB): LEFT TOTAL KNEE ARTHROPLASTY (Left) Active Problems:   OA (osteoarthritis) of knee  Estimated body mass index is 30.51 kg/m as calculated from the  following:   Height as of this encounter: '5\' 6"'$  (1.676 m).   Weight as of this encounter: 85.7 kg (189 lb). Up with therapy  DVT Prophylaxis - Xarelto Weight-Bearing as tolerated to left leg D/C O2 and Pulse OX and try on Room Air  Arlee Muslim, PA-C Orthopaedic Surgery 11/26/2016, 8:23 PM

## 2016-11-26 NOTE — Discharge Summary (Signed)
Physician Discharge Summary   Patient ID: Tara Murphy MRN: [id number] DOB/AGE: 05/06/1946 71 y.o.  Admit date: 11/25/2016 Discharge date: 11/27/2016  Primary Diagnosis:  Osteoarthritis Left knee(s) Admission Diagnoses:  Past Medical History:  Diagnosis Date  . Arthritis   . Breast cancer ([place])    left   . Cholecystolithiasis   . Lichen plano-pilaris   . Urinary tract infection    hx of frequent    Discharge Diagnoses:   Active Problems:   OA (osteoarthritis) of knee  Estimated body mass index is 30.51 kg/m as calculated from the following:   Height as of this encounter: 5' 6"  (1.676 m).   Weight as of this encounter: 85.7 kg (189 lb).  Procedure:  Procedure(s) (LRB): LEFT TOTAL KNEE ARTHROPLASTY (Left)   Consults: None  HPI: Tara Murphy is a 71 y.o. year old female with end stage OA of her left knee with progressively worsening pain and dysfunction. She has constant pain, with activity and at rest and significant functional deficits with difficulties even with ADLs. She has had extensive non-op management including analgesics, injections of cortisone and viscosupplements, and home exercise program, but remains in significant pain with significant dysfunction. Radiographs show bone on bone arthritis medial and patellofemoral. She presents now for left Total Knee Arthroplasty.    Laboratory Data: Admission on 11/25/2016  Component Date Value Ref Range Status  . WBC 11/26/2016 10.2  4.0 - 10.5 K/uL Final  . RBC 11/26/2016 3.49* 3.87 - 5.11 MIL/uL Final  . Hemoglobin 11/26/2016 10.4* 12.0 - 15.0 g/dL Final  . HCT 11/26/2016 31.3* 36.0 - 46.0 % Final  . MCV 11/26/2016 89.7  78.0 - 100.0 fL Final  . MCH 11/26/2016 29.8  26.0 - 34.0 pg Final  . MCHC 11/26/2016 33.2  30.0 - 36.0 g/dL Final  . RDW 11/26/2016 12.8  11.5 - 15.5 % Final  . Platelets 11/26/2016 256  150 - 400 K/uL Final  . Sodium 11/26/2016 140  135 - 145 mmol/L Final  . Potassium 11/26/2016 4.5  3.5 - 5.1  mmol/L Final  . Chloride 11/26/2016 111  101 - 111 mmol/L Final  . CO2 11/26/2016 23  22 - 32 mmol/L Final  . Glucose, Bld 11/26/2016 123* 65 - 99 mg/dL Final  . BUN 11/26/2016 11  6 - 20 mg/dL Final  . Creatinine, Ser 11/26/2016 0.67  0.44 - 1.00 mg/dL Final  . Calcium 11/26/2016 8.7* 8.9 - 10.3 mg/dL Final  . GFR calc non Af Amer 11/26/2016 >60  >60 mL/min Final  . GFR calc Af Amer 11/26/2016 >60  >60 mL/min Final   Comment: (NOTE) The eGFR has been calculated using the CKD EPI equation. This calculation has not been validated in all clinical situations. eGFR's persistently <60 mL/min signify possible Chronic Kidney Disease.   Georgiann Hahn gap 11/26/2016 6  5 - 15 Final  Hospital Outpatient Visit on 11/19/2016  Component Date Value Ref Range Status  . aPTT 11/19/2016 31  24 - 36 seconds Final  . Sodium 11/19/2016 139  135 - 145 mmol/L Final  . Potassium 11/19/2016 4.4  3.5 - 5.1 mmol/L Final  . Chloride 11/19/2016 108  101 - 111 mmol/L Final  . CO2 11/19/2016 25  22 - 32 mmol/L Final  . Glucose, Bld 11/19/2016 94  65 - 99 mg/dL Final  . BUN 11/19/2016 12  6 - 20 mg/dL Final  . Creatinine, Ser 11/19/2016 0.57  0.44 - 1.00 mg/dL Final  . Calcium 11/19/2016 9.1  8.9 - 10.3 mg/dL Final  . Total Protein 11/19/2016 7.1  6.5 - 8.1 g/dL Final  . Albumin 11/19/2016 4.4  3.5 - 5.0 g/dL Final  . AST 11/19/2016 18  15 - 41 U/L Final  . ALT 11/19/2016 19  14 - 54 U/L Final  . Alkaline Phosphatase 11/19/2016 98  38 - 126 U/L Final  . Total Bilirubin 11/19/2016 0.4  0.3 - 1.2 mg/dL Final  . GFR calc non Af Amer 11/19/2016 >60  >60 mL/min Final  . GFR calc Af Amer 11/19/2016 >60  >60 mL/min Final   Comment: (NOTE) The eGFR has been calculated using the CKD EPI equation. This calculation has not been validated in all clinical situations. eGFR's persistently <60 mL/min signify possible Chronic Kidney Disease.   . Anion gap 11/19/2016 6  5 - 15 Final  . Prothrombin Time 11/19/2016 12.1  11.4 -  15.2 seconds Final  . INR 11/19/2016 0.89   Final  . ABO/RH(D) 11/19/2016 O POS   Final  . Antibody Screen 11/19/2016 NEG   Final  . Sample Expiration 11/19/2016 11/28/2016   Final  . Extend sample reason 11/19/2016 NO TRANSFUSIONS OR PREGNANCY IN THE PAST 3 MONTHS   Final  Admission on 10/28/2016, Discharged on 10/30/2016  Component Date Value Ref Range Status  . Sodium 10/28/2016 141  135 - 145 mmol/L Final  . Potassium 10/28/2016 4.2  3.5 - 5.1 mmol/L Final  . Chloride 10/28/2016 105  101 - 111 mmol/L Final  . CO2 10/28/2016 23  22 - 32 mmol/L Final  . Glucose, Bld 10/28/2016 143* 65 - 99 mg/dL Final  . BUN 10/28/2016 16  6 - 20 mg/dL Final  . Creatinine, Ser 10/28/2016 0.75  0.44 - 1.00 mg/dL Final  . Calcium 10/28/2016 10.2  8.9 - 10.3 mg/dL Final  . GFR calc non Af Amer 10/28/2016 >60  >60 mL/min Final  . GFR calc Af Amer 10/28/2016 >60  >60 mL/min Final   Comment: (NOTE) The eGFR has been calculated using the CKD EPI equation. This calculation has not been validated in all clinical situations. eGFR's persistently <60 mL/min signify possible Chronic Kidney Disease.   . Anion gap 10/28/2016 13  5 - 15 Final  . WBC 10/28/2016 14.7* 4.0 - 10.5 K/uL Final  . RBC 10/28/2016 4.96  3.87 - 5.11 MIL/uL Final  . Hemoglobin 10/28/2016 14.7  12.0 - 15.0 g/dL Final  . HCT 10/28/2016 44.4  36.0 - 46.0 % Final  . MCV 10/28/2016 89.5  78.0 - 100.0 fL Final  . MCH 10/28/2016 29.6  26.0 - 34.0 pg Final  . MCHC 10/28/2016 33.1  30.0 - 36.0 g/dL Final  . RDW 10/28/2016 12.7  11.5 - 15.5 % Final  . Platelets 10/28/2016 349  150 - 400 K/uL Final  . Troponin i, poc 10/28/2016 0.02  0.00 - 0.08 ng/mL Final  . Comment 3 10/28/2016          Final   Comment: Due to the release kinetics of cTnI, a negative result within the first hours of the onset of symptoms does not rule out myocardial infarction with certainty. If myocardial infarction is still suspected, repeat the test at appropriate  intervals.   . Total Protein 10/28/2016 7.1  6.5 - 8.1 g/dL Final  . Albumin 10/28/2016 4.7  3.5 - 5.0 g/dL Final  . AST 10/28/2016 42* 15 - 41 U/L Final  . ALT 10/28/2016 23  14 - 54 U/L Final  . Alkaline  Phosphatase 10/28/2016 97  38 - 126 U/L Final  . Total Bilirubin 10/28/2016 1.2  0.3 - 1.2 mg/dL Final  . Bilirubin, Direct 10/28/2016 0.5  0.1 - 0.5 mg/dL Final  . Indirect Bilirubin 10/28/2016 0.7  0.3 - 0.9 mg/dL Final  . Lipase 10/28/2016 28  11 - 51 U/L Final  . WBC 10/28/2016 14.9* 4.0 - 10.5 K/uL Final  . RBC 10/28/2016 4.37  3.87 - 5.11 MIL/uL Final  . Hemoglobin 10/28/2016 12.9  12.0 - 15.0 g/dL Final  . HCT 10/28/2016 39.3  36.0 - 46.0 % Final  . MCV 10/28/2016 89.9  78.0 - 100.0 fL Final  . MCH 10/28/2016 29.5  26.0 - 34.0 pg Final  . MCHC 10/28/2016 32.8  30.0 - 36.0 g/dL Final  . RDW 10/28/2016 12.9  11.5 - 15.5 % Final  . Platelets 10/28/2016 331  150 - 400 K/uL Final  . Creatinine, Ser 10/28/2016 0.84  0.44 - 1.00 mg/dL Final  . GFR calc non Af Amer 10/28/2016 >60  >60 mL/min Final  . GFR calc Af Amer 10/28/2016 >60  >60 mL/min Final   Comment: (NOTE) The eGFR has been calculated using the CKD EPI equation. This calculation has not been validated in all clinical situations. eGFR's persistently <60 mL/min signify possible Chronic Kidney Disease.   . Sodium 10/29/2016 143  135 - 145 mmol/L Final  . Potassium 10/29/2016 4.3  3.5 - 5.1 mmol/L Final  . Chloride 10/29/2016 107  101 - 111 mmol/L Final  . CO2 10/29/2016 25  22 - 32 mmol/L Final  . Glucose, Bld 10/29/2016 121* 65 - 99 mg/dL Final  . BUN 10/29/2016 11  6 - 20 mg/dL Final  . Creatinine, Ser 10/29/2016 0.74  0.44 - 1.00 mg/dL Final  . Calcium 10/29/2016 8.8* 8.9 - 10.3 mg/dL Final  . Total Protein 10/29/2016 5.9* 6.5 - 8.1 g/dL Final  . Albumin 10/29/2016 3.5  3.5 - 5.0 g/dL Final  . AST 10/29/2016 28  15 - 41 U/L Final  . ALT 10/29/2016 24  14 - 54 U/L Final  . Alkaline Phosphatase 10/29/2016 77  38 -  126 U/L Final  . Total Bilirubin 10/29/2016 1.2  0.3 - 1.2 mg/dL Final  . GFR calc non Af Amer 10/29/2016 >60  >60 mL/min Final  . GFR calc Af Amer 10/29/2016 >60  >60 mL/min Final   Comment: (NOTE) The eGFR has been calculated using the CKD EPI equation. This calculation has not been validated in all clinical situations. eGFR's persistently <60 mL/min signify possible Chronic Kidney Disease.   . Anion gap 10/29/2016 11  5 - 15 Final  . WBC 10/29/2016 10.8* 4.0 - 10.5 K/uL Final  . RBC 10/29/2016 4.12  3.87 - 5.11 MIL/uL Final  . Hemoglobin 10/29/2016 12.1  12.0 - 15.0 g/dL Final  . HCT 10/29/2016 37.5  36.0 - 46.0 % Final  . MCV 10/29/2016 91.0  78.0 - 100.0 fL Final  . MCH 10/29/2016 29.4  26.0 - 34.0 pg Final  . MCHC 10/29/2016 32.3  30.0 - 36.0 g/dL Final  . RDW 10/29/2016 13.2  11.5 - 15.5 % Final  . Platelets 10/29/2016 300  150 - 400 K/uL Final  . MRSA, PCR 10/29/2016 NEGATIVE  NEGATIVE Final  . Staphylococcus aureus 10/29/2016 NEGATIVE  NEGATIVE Final   Comment:        The Xpert SA Assay (FDA approved for NASAL specimens in patients over 33 years of age), is one component of a comprehensive  surveillance program.  Test performance has been validated by [facility] for patients greater than or equal to 45 year old. It is not intended to diagnose infection nor to guide or monitor treatment.   . Sodium 10/30/2016 139  135 - 145 mmol/L Final  . Potassium 10/30/2016 4.1  3.5 - 5.1 mmol/L Final  . Chloride 10/30/2016 106  101 - 111 mmol/L Final  . CO2 10/30/2016 22  22 - 32 mmol/L Final  . Glucose, Bld 10/30/2016 130* 65 - 99 mg/dL Final  . BUN 10/30/2016 5* 6 - 20 mg/dL Final  . Creatinine, Ser 10/30/2016 0.55  0.44 - 1.00 mg/dL Final  . Calcium 10/30/2016 8.1* 8.9 - 10.3 mg/dL Final  . Total Protein 10/30/2016 5.2* 6.5 - 8.1 g/dL Final  . Albumin 10/30/2016 3.0* 3.5 - 5.0 g/dL Final  . AST 10/30/2016 69* 15 - 41 U/L Final  . ALT 10/30/2016 75* 14 - 54 U/L Final  .  Alkaline Phosphatase 10/30/2016 69  38 - 126 U/L Final  . Total Bilirubin 10/30/2016 0.7  0.3 - 1.2 mg/dL Final  . GFR calc non Af Amer 10/30/2016 >60  >60 mL/min Final  . GFR calc Af Amer 10/30/2016 >60  >60 mL/min Final   Comment: (NOTE) The eGFR has been calculated using the CKD EPI equation. This calculation has not been validated in all clinical situations. eGFR's persistently <60 mL/min signify possible Chronic Kidney Disease.   . Anion gap 10/30/2016 11  5 - 15 Final  . WBC 10/30/2016 10.4  4.0 - 10.5 K/uL Final  . RBC 10/30/2016 3.63* 3.87 - 5.11 MIL/uL Final  . Hemoglobin 10/30/2016 10.7* 12.0 - 15.0 g/dL Final  . HCT 10/30/2016 33.3* 36.0 - 46.0 % Final  . MCV 10/30/2016 91.7  78.0 - 100.0 fL Final  . MCH 10/30/2016 29.5  26.0 - 34.0 pg Final  . MCHC 10/30/2016 32.1  30.0 - 36.0 g/dL Final  . RDW 10/30/2016 12.9  11.5 - 15.5 % Final  . Platelets 10/30/2016 248  150 - 400 K/uL Final     X-Rays:Dg Chest 2 View  Result Date: 10/28/2016 CLINICAL DATA:  Chest pain, vomiting EXAM: CHEST  2 VIEW COMPARISON:  None. FINDINGS: Linear atelectasis or scarring in the lingula. Right lung clear. Heart is upper limits normal in size. No effusions or acute bony abnormality. IMPRESSION: Lingular atelectasis or scarring.  No active disease. Electronically Signed   By: Rolm Baptise M.D.   On: 10/28/2016 13:39   US Abdomen Limited Ruq  Result Date: 10/28/2016 CLINICAL DATA:  Chest pain starting this morning. History of breast cancer. EXAM: US ABDOMEN LIMITED - RIGHT UPPER QUADRANT COMPARISON:  None. FINDINGS: Gallbladder: Multiple gallstones in the gallbladder measuring up to 2.4 cm in length. No gallbladder wall thickening. Sonographic Murphy's sign absent. Is also some sludge in the gallbladder. Common bile duct: Diameter: 5 mm Liver: Coarse echogenic liver with poor sonic penetration compatible with diffuse hepatic steatosis. Mild fatty sparing along the gallbladder fossa. IMPRESSION: 1.  Cholelithiasis and gallbladder sludge.  No biliary dilatation. 2. Coarse echogenic liver with poor sonic penetration compatible with diffuse hepatic steatosis. Electronically Signed   By: Van Clines M.D.   On: 10/28/2016 18:17    EKG: Orders placed or performed during the hospital encounter of 10/28/16  . EKG 12-Lead  . EKG 12-Lead  . ED EKG within 10 minutes  . ED EKG within 10 minutes  . EKG     Hospital Course: [facility]  is a 70 y.o. who was admitted to [facility]. They were brought to the operating room on 11/25/2016 and underwent Procedure(s): LEFT TOTAL KNEE ARTHROPLASTY.  Patient tolerated the procedure well and was later transferred to the recovery room and then to the orthopaedic floor for postoperative care.  They were given PO and IV analgesics for pain control following their surgery.  They were given 24 hours of postoperative antibiotics of  Anti-infectives    Start     Dose/Rate Route Frequency Ordered Stop   11/25/16 1500  ceFAZolin (ANCEF) IVPB 2g/100 mL premix     2 g 200 mL/hr over 30 Minutes Intravenous Every 6 hours 11/25/16 1152 11/25/16 2143   11/25/16 0641  ceFAZolin (ANCEF) IVPB 2g/100 mL premix     2 g 200 mL/hr over 30 Minutes Intravenous On call to O.R. 11/25/16 4174 11/25/16 0814     and started on DVT prophylaxis in the form of Xarelto.   PT and OT were ordered for total joint protocol.  Discharge planning consulted to help with postop disposition and equipment needs.  Patient had a decent night on the evening of surgery.  They started to get up OOB with therapy on day one. Hemovac drain was pulled without difficulty.  Continued to work with therapy into day two.  Dressing was changed on day two and the incision was healing well. Patient was seen in rounds and was ready to go home.  Discharge home with home health Diet - Regular diet Follow up - in 2 weeks Activity - WBAT Disposition - Home Condition Upon Discharge - Good D/C Meds - See  DC Summary DVT Prophylaxis - Xarelto   Discharge Instructions    Call MD / Call 911    Complete by:  As directed    If you experience chest pain or shortness of breath, CALL 911 and be transported to the hospital emergency room.  If you develope a fever above 101 F, pus (white drainage) or increased drainage or redness at the wound, or calf pain, call your surgeon's office.   Change dressing    Complete by:  As directed    Change dressing daily with sterile 4 x 4 inch gauze dressing and apply TED hose. Do not submerge the incision under water.   Constipation Prevention    Complete by:  As directed    Drink plenty of fluids.  Prune juice may be helpful.  You may use a stool softener, such as Colace (over the counter) 100 mg twice a day.  Use MiraLax (over the counter) for constipation as needed.   Diet general    Complete by:  As directed    Discharge instructions    Complete by:  As directed    Pick up stool softner and laxative for home use following surgery while on pain medications. Do not submerge incision under water. Please use good hand washing techniques while changing dressing each day. May shower starting three days after surgery. Please use a clean towel to pat the incision dry following showers. Continue to use ice for pain and swelling after surgery. Do not use any lotions or creams on the incision until instructed by your surgeon.  Wear both TED hose on both legs during the day every day for three weeks, but may have off at night at home.  Postoperative Constipation Protocol  Constipation - defined medically as fewer than three stools per week and severe constipation as less than one stool per week.  One of the most common issues patients have following surgery is constipation.  Even if you have a regular bowel pattern at home, your normal regimen is likely to be disrupted due to multiple reasons following surgery.  Combination of anesthesia, postoperative narcotics,  change in appetite and fluid intake all can affect your bowels.  In order to avoid complications following surgery, here are some recommendations in order to help you during your recovery period.  Colace (docusate) - Pick up an over-the-counter form of Colace or another stool softener and take twice a day as long as you are requiring postoperative pain medications.  Take with a full glass of water daily.  If you experience loose stools or diarrhea, hold the colace until you stool forms back up.  If your symptoms do not get better within 1 week or if they get worse, check with your doctor.  Dulcolax (bisacodyl) - Pick up over-the-counter and take as directed by the product packaging as needed to assist with the movement of your bowels.  Take with a full glass of water.  Use this product as needed if not relieved by Colace only.   MiraLax (polyethylene glycol) - Pick up over-the-counter to have on hand.  MiraLax is a solution that will increase the amount of water in your bowels to assist with bowel movements.  Take as directed and can mix with a glass of water, juice, soda, coffee, or tea.  Take if you go more than two days without a movement. Do not use MiraLax more than once per day. Call your doctor if you are still constipated or irregular after using this medication for 7 days in a row.  If you continue to have problems with postoperative constipation, please contact the office for further assistance and recommendations.  If you experience "the worst abdominal pain ever" or develop nausea or vomiting, please contact the office immediatly for further recommendations for treatment.   Take Xarelto for two and a half more weeks, then discontinue Xarelto. Once the patient has completed the blood thinner regimen, then take a Baby 81 mg Aspirin daily for three more weeks.   Do not put a pillow under the knee. Place it under the heel.    Complete by:  As directed    Do not sit on low chairs, stoools or  toilet seats, as it may be difficult to get up from low surfaces    Complete by:  As directed    Driving restrictions    Complete by:  As directed    No driving until released by the physician.   Increase activity slowly as tolerated    Complete by:  As directed    Lifting restrictions    Complete by:  As directed    No lifting until released by the physician.   Patient may shower    Complete by:  As directed    You may shower without a dressing once there is no drainage.  Do not wash over the wound.  If drainage remains, do not shower until drainage stops.   TED hose    Complete by:  As directed    Use stockings (TED hose) for 3 weeks on both leg(s).  You may remove them at night for sleeping.   Weight bearing as tolerated    Complete by:  As directed    Laterality:  left   Extremity:  Lower     Allergies as of 11/26/2016      Reactions  Contrast Media [iodinated Diagnostic Agents] Hives   Hydromorphone Hcl Nausea Only   Sulfamethoxazole-trimethoprim Hives   Fentanyl Other (See Comments)   Redness and flushing over body    Tape Rash   Certain Band-Aids don't agree with the patient's skin; hospital ID wristband causes rashes   Terbinafine And Related Hives, Rash      Medication List    STOP taking these medications   amoxicillin 875 MG tablet Commonly known as:  AMOXIL     TAKE these medications   acetaminophen 500 MG tablet Commonly known as:  TYLENOL Take 500 mg by mouth every 6 (six) hours as needed (for pain.).   clobetasol 0.05 % external solution Commonly known as:  TEMOVATE Apply 1 application topically 2 (two) times daily as needed (for scalp inflammation).   clonazePAM 0.5 MG tablet Commonly known as:  KLONOPIN Take 0.5 mg by mouth at bedtime as needed (for sleep).   fluticasone 50 MCG/ACT nasal spray Commonly known as:  FLONASE Place 1 spray into the nose daily as needed for allergies or rhinitis.   gabapentin 300 MG capsule Commonly known as:   NEURONTIN Take 600 mg by mouth 2 (two) times daily.   methocarbamol 500 MG tablet Commonly known as:  ROBAXIN Take 1 tablet (500 mg total) by mouth every 6 (six) hours as needed for muscle spasms.   oxyCODONE 5 MG immediate release tablet Commonly known as:  Oxy IR/ROXICODONE Take 1-2 tablets (5-10 mg total) by mouth every 4 (four) hours as needed for moderate pain or severe pain. What changed:  when to take this   rivaroxaban 10 MG Tabs tablet Commonly known as:  XARELTO Take 1 tablet (10 mg total) by mouth daily with breakfast. Take Xarelto for two and a half more weeks following discharge from the hospital, then discontinue Xarelto. Once the patient has completed the blood thinner regimen, then take a Baby 81 mg Aspirin daily for three more weeks. Start taking on:  11/27/2016   traMADol 50 MG tablet Commonly known as:  ULTRAM Take 1-2 tablets (50-100 mg total) by mouth every 6 (six) hours as needed (mild pain). What changed:  reasons to take this      Follow-up Information    [name], MD. Schedule an appointment as soon as possible for a visit on 12/10/2016.   Specialty:  Orthopedic Surgery Contact information: [address] [place] [address] [phone]           Signed: Arlee Muslim, PA-C Orthopaedic Surgery 11/26/2016, 8:30 PM

## 2016-11-26 NOTE — Care Management Note (Signed)
Case Management Note  Patient Details  Name: Tara Murphy MRN: [id number] Date of Birth: January 19, 1946  Subjective/Objective:                  Left  Total Knee Arthroplasty Action/Plan: Discharge planning Expected Discharge Date:                  Expected Discharge Plan:  Home/Self Care  In-House Referral:     Discharge planning Services  CM Consult  Post Acute Care Choice:  NA Choice offered to:  Patient  DME Arranged:  N/A DME Agency:  NA  HH Arranged:  NA HH Agency:  NA  Status of Service:  Completed, signed off  If discussed at [place] of Stay Meetings, dates discussed:    Additional Comments: CM met with pt in room to confirm plan is for outpt PT; pt confirms.  Pt also states she has all DME needed at home. No other CM needs were communicated. Dellie Catholic, RN 11/26/2016, 11:08 AM

## 2016-11-26 NOTE — Progress Notes (Signed)
Physical Therapy Treatment Patient Details Name: Tara Murphy MRN: [id number] DOB: 1946/08/02 Today's Date: 11/26/2016    History of Present Illness 71 yo female s/p L TKA 11/25/16.  Hx of R THA-DA, R TKA    PT Comments    POD # 1 am session Supine:  BP 118/64(75), HR 72, RA 98% EOB:     BP 124/65(81), HR 75, RA 98% Amb:      BP 133/67(84), HR 78, RA 99% amb a greater distance in hallway with mild c/o "light headed" (meds?). Performed TKR TE's followed by ICE.    Follow Up Recommendations  Outpatient PT;Supervision for mobility/OOB     Equipment Recommendations  None recommended by PT    Recommendations for Other Services       Precautions / Restrictions Precautions Precautions: Fall Precaution Comments: instructed on KI use for amb and stairs Required Braces or Orthoses: Knee Immobilizer - Left Restrictions Weight Bearing Restrictions: No LLE Weight Bearing: Weight bearing as tolerated    Mobility  Bed Mobility Overal bed mobility: Needs Assistance Bed Mobility: Supine to Sit     Supine to sit: Min guard     General bed mobility comments: assist L LE and increased time   Transfers Overall transfer level: Needs assistance Equipment used: Rolling walker (2 wheeled) Transfers: Sit to/from Stand Sit to Stand: Min guard         General transfer comment: 25% VC's on proper hand placement and L LE advancement.  Ambulation/Gait Ambulation/Gait assistance: Min guard Ambulation Distance (Feet): 35 Feet Assistive device: Rolling walker (2 wheeled) Gait Pattern/deviations: Step-to pattern;Decreased stride length Gait velocity: decreased   General Gait Details: 25% VC's on proper sequencing, walker to self distance and safety with turns.  Mild c/o "light headed".  BP amb 133/67   Stairs            Wheelchair Mobility    Modified Rankin (Stroke Patients Only)       Balance   Total Knee Replacement TE's 10 reps B LE ankle pumps 10 reps towel  squeezes 10 reps knee presses 10 reps heel slides  10 reps SAQ's 10 reps SLR's 10 reps ABD Followed by ICE                                  Cognition Arousal/Alertness: Awake/alert Behavior During Therapy: WFL for tasks assessed/performed Overall Cognitive Status: Within Functional Limits for tasks assessed                      Exercises      General Comments        Pertinent Vitals/Pain Pain Assessment: 0-10 Pain Score: 3  Pain Location: L knee Pain Descriptors / Indicators: Aching;Sore Pain Intervention(s): Monitored during session;Repositioned;Ice applied    Home Living                      Prior Function            PT Goals (current goals can now be found in the care plan section) Progress towards PT goals: Progressing toward goals    Frequency    7X/week      PT Plan Current plan remains appropriate    Co-evaluation             End of Session Equipment Utilized During Treatment: Gait belt Activity Tolerance: Patient tolerated treatment well Patient left: in chair;with  call bell/phone within reach;with family/visitor present   PT Visit Diagnosis: Difficulty in walking, not elsewhere classified (R26.2)     Time: 5974-1638 PT Time Calculation (min) (ACUTE ONLY): 43 min  Charges:  $Gait Training: 8-22 mins $Therapeutic Exercise: 8-22 mins $Therapeutic Activity: 8-22 mins                    G Codes:       Rica Koyanagi  PTA [facility]  Acute  Rehab Pager      [phone]

## 2016-11-27 LAB — CBC
HEMATOCRIT: 30.3 % — AB (ref 36.0–46.0)
Hemoglobin: 9.9 g/dL — ABNORMAL LOW (ref 12.0–15.0)
MCH: 28.9 pg (ref 26.0–34.0)
MCHC: 32.7 g/dL (ref 30.0–36.0)
MCV: 88.6 fL (ref 78.0–100.0)
Platelets: 239 10*3/uL (ref 150–400)
RBC: 3.42 MIL/uL — AB (ref 3.87–5.11)
RDW: 12.9 % (ref 11.5–15.5)
WBC: 11 10*3/uL — ABNORMAL HIGH (ref 4.0–10.5)

## 2016-11-27 LAB — BASIC METABOLIC PANEL
Anion gap: 5 (ref 5–15)
BUN: 11 mg/dL (ref 6–20)
CHLORIDE: 108 mmol/L (ref 101–111)
CO2: 27 mmol/L (ref 22–32)
CREATININE: 0.5 mg/dL (ref 0.44–1.00)
Calcium: 8.9 mg/dL (ref 8.9–10.3)
GFR calc non Af Amer: >60 mL/min (ref >60)
Glucose, Bld: 103 mg/dL — ABNORMAL HIGH (ref 65–99)
POTASSIUM: 3.8 mmol/L (ref 3.5–5.1)
SODIUM: 140 mmol/L (ref 135–145)

## 2016-11-27 NOTE — Progress Notes (Signed)
Physical Therapy Treatment Patient Details Name: Tara Murphy MRN: [id number] DOB: 08-Jun-1946 Today's Date: 11/27/2016    History of Present Illness 71 yo female s/p L TKA 11/25/16.  Hx of R THA-DA, R TKA    PT Comments    POD # 2  Pt able to perform 10 active SLR so D/C KI.  Assisted OOB to amb a greater distance.  Performed all supine TKR TE's.  Instructed on proper tech and freq as well as use of ICE.   Follow Up Recommendations  Outpatient PT;Supervision for mobility/OOB     Equipment Recommendations  None recommended by PT    Recommendations for Other Services       Precautions / Restrictions Precautions Precautions: Fall Precaution Comments: pt able to perform 10 active SLR Restrictions Weight Bearing Restrictions: No LLE Weight Bearing: Weight bearing as tolerated    Mobility  Bed Mobility Overal bed mobility: Modified Independent             General bed mobility comments: able to perform increased time  Transfers Overall transfer level: Needs assistance Equipment used: Rolling walker (2 wheeled) Transfers: Sit to/from Stand Sit to Stand: Supervision         General transfer comment: one VC to extend L LE prior to sit  Ambulation/Gait Ambulation/Gait assistance: Supervision Ambulation Distance (Feet): 56 Feet Assistive device: Rolling walker (2 wheeled) Gait Pattern/deviations: Step-to pattern;Step-through pattern Gait velocity: decreased   General Gait Details: one VC on safety with turns with increased amb distance/tolerance.    Stairs Stairs:  (no stairs to enter home)          Wheelchair Mobility    Modified Rankin (Stroke Patients Only)       Balance                                    Cognition Arousal/Alertness: Awake/alert Behavior During Therapy: WFL for tasks assessed/performed Overall Cognitive Status: Within Functional Limits for tasks assessed                      Exercises   Total Knee  Replacement TE's 10 reps B LE ankle pumps 10 reps towel squeezes 10 reps knee presses 10 reps heel slides  10 reps SAQ's 10 reps SLR's 10 reps ABD Followed by ICE    General Comments        Pertinent Vitals/Pain Pain Assessment: 0-10 Pain Score: 3  Pain Location: L knee Pain Descriptors / Indicators: Aching;Sore Pain Intervention(s): Monitored during session;Repositioned;Ice applied    Home Living                      Prior Function            PT Goals (current goals can now be found in the care plan section) Progress towards PT goals: Progressing toward goals    Frequency    7X/week      PT Plan Current plan remains appropriate    Co-evaluation             End of Session Equipment Utilized During Treatment: Gait belt Activity Tolerance: Patient tolerated treatment well Patient left: in bed;with call bell/phone within reach;with family/visitor present Nurse Communication:  (pt ready for D/C to home) PT Visit Diagnosis: Difficulty in walking, not elsewhere classified (R26.2)     Time: 3016-0109 PT Time Calculation (min) (ACUTE ONLY): 24 min  Charges:  $  Gait Training: 8-22 mins $Therapeutic Exercise: 8-22 mins                    G Codes:       Rica Koyanagi  PTA [facility]  Acute  Rehab Pager      [phone]

## 2016-11-27 NOTE — Progress Notes (Signed)
   Subjective: 2 Days Post-Op Procedure(s) (LRB): LEFT TOTAL KNEE ARTHROPLASTY (Left) Patient reports pain as mild.   Patient seen in rounds with Dr. Wynelle Link. Patient is well, and has had no acute complaints or problems Patient is ready to go home  Objective: Vital signs in last 24 hours: Temp:  [97.4 F (36.3 C)-99.5 F (37.5 C)] 98.1 F (36.7 C) (03/07 0502) Pulse Rate:  [59-67] 65 (03/07 0502) Resp:  [16-20] 18 (03/07 0502) BP: (108-123)/(47-67) 123/67 (03/07 0502) SpO2:  [96 %-99 %] 98 % (03/07 0502)  Intake/Output from previous day:  Intake/Output Summary (Last 24 hours) at 11/27/16 0809 Last data filed at 11/27/16 0615  Gross per 24 hour  Intake             2280 ml  Output             3950 ml  Net            -1670 ml    Intake/Output this shift: No intake/output data recorded.  Labs:  Recent Labs  11/26/16 0531 11/27/16 0426  HGB 10.4* 9.9*    Recent Labs  11/26/16 0531 11/27/16 0426  WBC 10.2 11.0*  RBC 3.49* 3.42*  HCT 31.3* 30.3*  PLT 256 239    Recent Labs  11/26/16 0531 11/27/16 0426  NA 140 140  K 4.5 3.8  CL 111 108  CO2 23 27  BUN 11 11  CREATININE 0.67 0.50  GLUCOSE 123* 103*  CALCIUM 8.7* 8.9   No results for input(s): LABPT, INR in the last 72 hours.  EXAM: General - Patient is Alert, Appropriate and Oriented Extremity - Neurovascular intact Sensation intact distally Incision - clean, dry Motor Function - intact, moving foot and toes well on exam.   Assessment/Plan: 2 Days Post-Op Procedure(s) (LRB): LEFT TOTAL KNEE ARTHROPLASTY (Left) Procedure(s) (LRB): LEFT TOTAL KNEE ARTHROPLASTY (Left) Past Medical History:  Diagnosis Date  . Arthritis   . Breast cancer ([place])    left   . Cholecystolithiasis   . Lichen plano-pilaris   . Urinary tract infection    hx of frequent    Active Problems:   OA (osteoarthritis) of knee  Estimated body mass index is 30.51 kg/m as calculated from the following:   Height as of this  encounter: '5\' 6"'$  (1.676 m).   Weight as of this encounter: 85.7 kg (189 lb). Up with therapy Discharge home with home health Diet - Regular diet Follow up - in 2 weeks Activity - WBAT Disposition - Home Condition Upon Discharge - Good D/C Meds - See DC Summary DVT Prophylaxis - [place], PA-C Orthopaedic Surgery 11/27/2016, 8:09 AM

## 2016-11-29 DIAGNOSIS — M1712 Unilateral primary osteoarthritis, left knee: Secondary | ICD-10-CM | POA: Diagnosis not present

## 2016-12-03 DIAGNOSIS — M1712 Unilateral primary osteoarthritis, left knee: Secondary | ICD-10-CM | POA: Diagnosis not present

## 2016-12-06 DIAGNOSIS — M1712 Unilateral primary osteoarthritis, left knee: Secondary | ICD-10-CM | POA: Diagnosis not present

## 2016-12-09 DIAGNOSIS — M1712 Unilateral primary osteoarthritis, left knee: Secondary | ICD-10-CM | POA: Diagnosis not present

## 2016-12-10 DIAGNOSIS — Z471 Aftercare following joint replacement surgery: Secondary | ICD-10-CM | POA: Diagnosis not present

## 2016-12-10 DIAGNOSIS — Z96652 Presence of left artificial knee joint: Secondary | ICD-10-CM | POA: Diagnosis not present

## 2016-12-12 DIAGNOSIS — M1712 Unilateral primary osteoarthritis, left knee: Secondary | ICD-10-CM | POA: Diagnosis not present

## 2016-12-19 DIAGNOSIS — M1712 Unilateral primary osteoarthritis, left knee: Secondary | ICD-10-CM | POA: Diagnosis not present

## 2016-12-23 DIAGNOSIS — M1712 Unilateral primary osteoarthritis, left knee: Secondary | ICD-10-CM | POA: Diagnosis not present

## 2016-12-26 DIAGNOSIS — M1712 Unilateral primary osteoarthritis, left knee: Secondary | ICD-10-CM | POA: Diagnosis not present

## 2016-12-31 DIAGNOSIS — Z96652 Presence of left artificial knee joint: Secondary | ICD-10-CM | POA: Diagnosis not present

## 2016-12-31 DIAGNOSIS — Z471 Aftercare following joint replacement surgery: Secondary | ICD-10-CM | POA: Diagnosis not present

## 2017-02-11 DIAGNOSIS — M1712 Unilateral primary osteoarthritis, left knee: Secondary | ICD-10-CM | POA: Diagnosis not present

## 2017-02-11 DIAGNOSIS — Z96652 Presence of left artificial knee joint: Secondary | ICD-10-CM | POA: Diagnosis not present

## 2017-02-11 DIAGNOSIS — Z471 Aftercare following joint replacement surgery: Secondary | ICD-10-CM | POA: Diagnosis not present

## 2017-03-25 DIAGNOSIS — L650 Telogen effluvium: Secondary | ICD-10-CM | POA: Diagnosis not present

## 2017-03-25 DIAGNOSIS — E782 Mixed hyperlipidemia: Secondary | ICD-10-CM | POA: Diagnosis not present

## 2017-03-25 DIAGNOSIS — Z1231 Encounter for screening mammogram for malignant neoplasm of breast: Secondary | ICD-10-CM | POA: Diagnosis not present

## 2017-03-25 DIAGNOSIS — G609 Hereditary and idiopathic neuropathy, unspecified: Secondary | ICD-10-CM | POA: Diagnosis not present

## 2017-03-27 ENCOUNTER — Other Ambulatory Visit: Payer: Self-pay | Admitting: Family Medicine

## 2017-03-27 DIAGNOSIS — Z853 Personal history of malignant neoplasm of breast: Secondary | ICD-10-CM

## 2017-03-27 DIAGNOSIS — Z9889 Other specified postprocedural states: Secondary | ICD-10-CM

## 2017-04-07 NOTE — Anesthesia Postprocedure Evaluation (Signed)
Anesthesia Post Note  Patient: Tara Murphy  Procedure(s) Performed: Procedure(s) (LRB): LEFT TOTAL KNEE ARTHROPLASTY (Left)     Anesthesia Post Evaluation  Last Vitals:  Vitals:   11/26/16 2132 11/27/16 0502  BP: 108/60 123/67  Pulse: 67 65  Resp: 18 18  Temp: 37.4 C 36.7 C    Last Pain:  Vitals:   11/27/16 1250  TempSrc:   PainSc: 3                  Sharell Hilmer

## 2017-04-07 NOTE — Addendum Note (Signed)
Addendum  created 04/07/17 1736 by Suzette Battiest, MD   Sign clinical note

## 2017-05-02 DIAGNOSIS — M2041 Other hammer toe(s) (acquired), right foot: Secondary | ICD-10-CM | POA: Diagnosis not present

## 2017-05-02 DIAGNOSIS — M79671 Pain in right foot: Secondary | ICD-10-CM | POA: Diagnosis not present

## 2017-05-02 DIAGNOSIS — M19071 Primary osteoarthritis, right ankle and foot: Secondary | ICD-10-CM | POA: Diagnosis not present

## 2017-05-09 ENCOUNTER — Other Ambulatory Visit: Payer: Self-pay | Admitting: Nurse Practitioner

## 2017-05-09 DIAGNOSIS — Z853 Personal history of malignant neoplasm of breast: Secondary | ICD-10-CM

## 2017-05-09 DIAGNOSIS — Z9889 Other specified postprocedural states: Secondary | ICD-10-CM

## 2017-05-12 ENCOUNTER — Ambulatory Visit
Admission: RE | Admit: 2017-05-12 | Discharge: 2017-05-12 | Disposition: A | Discharge status: Home / Self Care | Payer: Medicare Other | Source: Ambulatory Visit | Attending: Family Medicine | Admitting: Family Medicine

## 2017-05-12 DIAGNOSIS — Z9889 Other specified postprocedural states: Secondary | ICD-10-CM

## 2017-05-12 DIAGNOSIS — R928 Other abnormal and inconclusive findings on diagnostic imaging of breast: Secondary | ICD-10-CM | POA: Diagnosis not present

## 2017-05-12 DIAGNOSIS — Z853 Personal history of malignant neoplasm of breast: Secondary | ICD-10-CM

## 2017-05-12 HISTORY — DX: Personal history of irradiation: Z92.3

## 2017-09-03 ENCOUNTER — Telehealth: Payer: Self-pay | Admitting: *Deleted

## 2017-09-03 NOTE — Telephone Encounter (Signed)
FYI "I have an appointment tomorrow I confirmed but I will not be in.  My neighborhood [place].  I've had both hips and knees replaced so I'm not coming out until all snow is gone.  I'll call back to reschedule."

## 2017-09-04 ENCOUNTER — Encounter: Payer: Medicare Other | Admitting: Adult Health

## 2017-10-02 DIAGNOSIS — Z13228 Encounter for screening for other metabolic disorders: Secondary | ICD-10-CM | POA: Diagnosis not present

## 2017-10-02 DIAGNOSIS — Z13220 Encounter for screening for lipoid disorders: Secondary | ICD-10-CM | POA: Diagnosis not present

## 2017-10-02 DIAGNOSIS — Z136 Encounter for screening for cardiovascular disorders: Secondary | ICD-10-CM | POA: Diagnosis not present

## 2017-10-02 DIAGNOSIS — R197 Diarrhea, unspecified: Secondary | ICD-10-CM | POA: Diagnosis not present

## 2017-10-02 DIAGNOSIS — Z0000 Encounter for general adult medical examination without abnormal findings: Secondary | ICD-10-CM | POA: Diagnosis not present

## 2017-10-02 DIAGNOSIS — Z23 Encounter for immunization: Secondary | ICD-10-CM | POA: Diagnosis not present

## 2017-10-02 DIAGNOSIS — Z1389 Encounter for screening for other disorder: Secondary | ICD-10-CM | POA: Diagnosis not present

## 2017-10-02 DIAGNOSIS — Z130 Encounter for screening for diseases of the blood and blood-forming organs and certain disorders involving the immune mechanism: Secondary | ICD-10-CM | POA: Diagnosis not present

## 2017-11-11 ENCOUNTER — Telehealth: Payer: Self-pay | Admitting: Family Medicine

## 2017-11-11 MED ORDER — GABAPENTIN 300 MG PO CAPS: 600.0000 mg | ORAL_CAPSULE | Freq: Two times a day (BID) | ORAL | 0 refills | Status: DC

## 2017-11-11 NOTE — Telephone Encounter (Signed)
Refilled gabapentin

## 2017-11-11 NOTE — Telephone Encounter (Signed)
Refilled meds. Please notify pt.

## 2017-11-11 NOTE — Telephone Encounter (Signed)
Patient has been notified.  She was appreciative and happy to see Dr. Jonni Sanger next week.

## 2017-11-11 NOTE — Telephone Encounter (Signed)
Copied from [place]. Topic: Quick Communication - See Telephone Encounter >> Nov 11, 2017 11:17 AM Conception Chancy, NT wrote: CRM for notification. See Telephone encounter for:  11/11/17.  Patient is a former patient of Dr. Jonni Sanger with [facility], she has a new patient appt with Dr. Jonni Sanger with [facility] on 11/20/17. She is requesting a refill on Gabapentin since Dr. Jonni Sanger has been her doctor for 3 years now, she states that's not something you can just stop taking. Please advise and contact patient for further questions. [place] [address] [place]

## 2017-11-19 ENCOUNTER — Encounter: Payer: Self-pay | Admitting: Family Medicine

## 2017-11-20 ENCOUNTER — Other Ambulatory Visit: Payer: Self-pay

## 2017-11-20 ENCOUNTER — Encounter: Payer: Self-pay | Admitting: Family Medicine

## 2017-11-20 ENCOUNTER — Ambulatory Visit (INDEPENDENT_AMBULATORY_CARE_PROVIDER_SITE_OTHER): Payer: Medicare Other | Admitting: Family Medicine

## 2017-11-20 VITALS — BP 122/80 | HR 64 | Temp 98.0°F | Ht 65.0 in | Wt 196.0 lb

## 2017-11-20 DIAGNOSIS — E7849 Other hyperlipidemia: Secondary | ICD-10-CM | POA: Diagnosis not present

## 2017-11-20 DIAGNOSIS — K219 Gastro-esophageal reflux disease without esophagitis: Secondary | ICD-10-CM | POA: Diagnosis not present

## 2017-11-20 DIAGNOSIS — L661 Lichen planopilaris: Secondary | ICD-10-CM | POA: Insufficient documentation

## 2017-11-20 DIAGNOSIS — R197 Diarrhea, unspecified: Secondary | ICD-10-CM

## 2017-11-20 DIAGNOSIS — Z8619 Personal history of other infectious and parasitic diseases: Secondary | ICD-10-CM

## 2017-11-20 DIAGNOSIS — G609 Hereditary and idiopathic neuropathy, unspecified: Secondary | ICD-10-CM

## 2017-11-20 DIAGNOSIS — G2581 Restless legs syndrome: Secondary | ICD-10-CM | POA: Insufficient documentation

## 2017-11-20 MED ORDER — TRAMADOL HCL 50 MG PO TABS
50.0000 mg | ORAL_TABLET | Freq: Four times a day (QID) | ORAL | 0 refills | Status: DC | PRN
Start: 2017-11-20 — End: 2017-12-30

## 2017-11-20 MED ORDER — ROSUVASTATIN CALCIUM 10 MG PO TABS
10.0000 mg | ORAL_TABLET | Freq: Every day | ORAL | 3 refills | Status: DC
Start: 2017-11-20 — End: 2018-02-02

## 2017-11-20 MED ORDER — SACCHAROMYCES BOULARDII 250 MG PO CAPS: 250.0000 mg | ORAL_CAPSULE | Freq: Two times a day (BID) | ORAL | 1 refills | Status: DC

## 2017-11-20 NOTE — Patient Instructions (Addendum)
It was so good seeing you again! Thank you for establishing with my new practice and allowing me to continue caring for you. It means a lot to me.   Please schedule a follow up appointment with me in 3 months to recheck your cholesterol levels on medication. Please come fasting.   Please return a stool sample to [name].

## 2017-11-20 NOTE — Progress Notes (Signed)
Subjective  CC:  Chief Complaint  Patient presents with  . Establish Care    Transfer from [facility]    HPI: Tara Murphy is a 72 y.o. female who presents to [facility] today to establish care with me as a new patient. She is a former [place] patient and is here to reestablish care with me today.   She has the following concerns or needs:   We reviewed her chronic problems: OA - foot is a problem now but ortho is following. Needs tramadol for prn use. Hips are well.   Diarrhea w/ history of C.diff - persistent since July (after choleycystectomy).  Reports frequent loose stools occasionally do have mucus but no blood.  No significant abdominal pain but does have intermittent abdominal cramping.  No history of irritable bowel disease or inflammatory bowel disease.  Last colonoscopy was in 2011 and was normal.  She did try an over-the-counter probiotic for about a month but made no significant difference.  She feels it is slightly improving but she tends to be a tolerant person.  No unwanted weight changes.  No appetite changes.  No nausea vomiting.  Peripheral neuropathy and restless leg syndrome managed by gabapentin fairly well.  Recent cholesterol testing reveals worsening hyperlipidemia that is likely familial.  She tends to eat a low-fat diet.  Her weight is mildly elevated due to her decrease in exercise/activity due to foot pain.  She is taking statin but only intermittently in the past.  She tolerated it well.  Last LDL was 197 several weeks back.  We updated and reviewed the patient's past history in detail and it is documented below.  Patient Active Problem List   Diagnosis Date Noted  . Familial hyperlipidemia 11/20/2017    Priority: High  . History of left breast cancer 03/20/2015    Priority: High  . GERD (gastroesophageal reflux disease) 11/20/2017    Priority: Medium  . Restless leg syndrome 11/20/2017    Priority: Medium  . OA  (osteoarthritis) of knee 12/18/2015    Priority: Medium  . OA (osteoarthritis) of hip 12/18/2015    Priority: Medium  . Osteoarthritis of spine with radiculopathy, 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    Priority: Medium  . History of Clostridium difficile colitis 06/16/2015    Priority: Medium  . 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 Neuropathy, peripheral, idiopathic 06/13/2015    Priority: Medium  . 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 Essential tremor 09/26/2016    Priority: Low  . Chronic allergic rhinitis 06/13/2015    Priority: Low  . Recurrent urinary tract infection 06/13/2015    Priority: Low   Health Maintenance  Topic Date Due  . Hepatitis C Screening  02/27/1946  . MAMMOGRAM  05/12/2018  . COLONOSCOPY  09/25/2019  . TETANUS/TDAP  06/23/2022  . INFLUENZA VACCINE  Completed  . DEXA SCAN  Completed  . PNA vac Low Risk Adult  Completed   Immunization History  Administered Date(s) Administered  . Influenza, High Dose Seasonal PF 06/13/2015, 09/26/2016, 10/02/2017  . Influenza,inj,Quad PF,6+ Mos 10/02/2017  . Pneumococcal Conjugate-13 08/15/2014  . Pneumococcal Polysaccharide-23 06/16/2013  . Tdap 06/23/2012  . Zoster 01/25/2011   Current Meds  Medication Sig  . acetaminophen (TYLENOL) 500 MG tablet Take 500 mg by mouth every 6 (six) hours as needed (for pain.).  Marland Kitchen clobetasol (TEMOVATE) 0.05 % external solution Apply  1 application topically 2 (two) times daily as needed (for scalp inflammation).   . clonazePAM (KLONOPIN) 0.5 MG tablet Take 0.5 mg by mouth at bedtime as needed (for sleep).   . fluticasone (FLONASE) 50 MCG/ACT nasal spray Place 1 spray into the nose daily as needed for allergies or rhinitis.   Marland Kitchen gabapentin (NEURONTIN) 300 MG capsule Take 2 capsules (600 mg total) by mouth 2 (two) times daily.    Allergies: Patient is allergic to contrast media [iodinated diagnostic  agents]; hydromorphone hcl; sulfamethoxazole-trimethoprim; fentanyl; tape; and terbinafine and related. 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 Past Surgical History Patient  has a past surgical history that includes Appendectomy; Total hip arthroplasty; Total knee arthroplasty; Tubal ligation; cataracts; Liposuction; Total hip arthroplasty (Right, 12/18/2015); Cholecystectomy (N/A, 10/29/2016); Total knee arthroplasty (Left, 11/25/2016); and Breast lumpectomy (Left, 2015). Family History: Patient family history includes Arthritis in her father and mother; Breast cancer in her sister; Diabetes in her mother; Hyperlipidemia in her maternal aunt; Lung cancer in her father. Social History:  Patient  reports that she quit smoking about 26 years ago. Her smoking use included cigarettes. she has never used smokeless tobacco. She reports that she drinks about 2.4 oz of alcohol per week. She reports that she does not use drugs.  Review of Systems: Constitutional: negative for fever or malaise Ophthalmic: negative for photophobia, double vision or loss of vision Cardiovascular: negative for chest pain, dyspnea on exertion, or new LE swelling Respiratory: negative for SOB or persistent cough Gastrointestinal: negative for abdominal pain, positive for mucoid diarrhea Genitourinary: negative for dysuria or gross hematuria Musculoskeletal: negative for new gait disturbance or muscular weakness Integumentary: negative for new or persistent rashes Neurological: negative for TIA or stroke symptoms Psychiatric: negative for SI  or delusions Allergic/Immunologic: negative for hives  Patient Care Team    Relationship Specialty Notifications Start End  Leamon Arnt, MD PCP - General Family Medicine  12/07/15     Objective  Vitals: BP 122/80   Pulse 64   Temp 98 F (36.7 C)   Ht 5\' 5"  (1.651 m)   Wt 196 lb (88.9 kg)   BMI 32.62 kg/m  General:  Well developed, well nourished, no acute distress  Psych:  Alert and oriented,normal mood and affect HEENT:  Normocephalic, atraumatic, non-icteric sclera,  Cardiovascular:  RRR without gallop, rub or murmur, nondisplaced PMI Respiratory:  Good breath sounds bilaterally, CTAB with normal respiratory effort Gastrointestinal: normal bowel sounds, soft, mild epigastric tenderness present without rebound or guarding, no noted masses. No HSM MSK: no deformities, contusions.    Assessment  1. Familial hyperlipidemia   2. Neuropathy, peripheral, idiopathic   3. Gastroesophageal reflux disease without esophagitis   4. History of Clostridium difficile colitis   5. Diarrhea of presumed infectious origin   6. Restless leg syndrome      Plan   Familial hyperlipidemia, education given.  To start Crestor 10 mg and recheck in 12 weeks.  Neuropathy and restless leg syndrome are fairly well controlled.  Continue gabapentin for now.  Continue additional medications of restless leg syndrome symptoms worsen  GERD managed with as needed medications.  Persistent diarrhea, worrisome for infectious etiology.  Recheck stool studies.  Trial of  probiotic ordered.  Studies are negative and symptoms persist, recommend GI evaluation.  Patient agrees  Health maintenance: Up-to-date with recent normal annual wellness visit.  Follow up:  Return in about 3 months (around 02/17/2018) for follow up hypercholesterolemia.  Commons side effects, risks, benefits, and alternatives for medications and treatment plan prescribed today were discussed, and the patient expressed understanding of the  given instructions. Patient is instructed to call or message via MyChart if he/she has any questions or concerns regarding our treatment plan. No barriers to understanding were identified. We discussed Red Flag symptoms and signs in detail. Patient expressed understanding regarding what to do in case of urgent or emergency type symptoms.   Medication list was reconciled, printed and provided to the patient in AVS. Patient instructions and summary information was reviewed with the patient as documented in the AVS. This note was prepared with assistance of Dragon voice recognition software. Occasional wrong-word or sound-a-like substitutions may have occurred due to the inherent limitations of voice recognition software  Orders Placed This Encounter  Procedures  . Stool culture  . Fecal leukocytes  . Clostridium difficile Toxin B, Qualitative, Real-Time PCR   Meds ordered this encounter  Medications  . rosuvastatin (CRESTOR) 10 MG tablet    Sig: Take 1 tablet (10 mg total) by mouth daily.    Dispense:  90 tablet    Refill:  3  . traMADol (ULTRAM) 50 MG tablet    Sig: Take 1 tablet (50 mg total) by mouth every 6 (six) hours as needed for moderate pain.    Dispense:  30 tablet    Refill:  0  . saccharomyces boulardii (FLORASTOR) 250 MG capsule    Sig: Take 1 capsule (250 mg total) by mouth 2 (two) times daily.    Dispense:  60 capsule    Refill:  1

## 2017-11-21 LAB — TIQ-NTM

## 2017-11-21 LAB — FECAL LACTOFERRIN, QUANT
Fecal Lactoferrin: NEGATIVE
MICRO NUMBER: 90263290
SPECIMEN QUALITY: ADEQUATE

## 2017-11-21 LAB — CLOSTRIDIUM DIFFICILE TOXIN B, QUALITATIVE, REAL-TIME PCR: Toxigenic C. Difficile by PCR: NOT DETECTED

## 2017-11-24 LAB — STOOL CULTURE
MICRO NUMBER: 90263298
MICRO NUMBER: 90263299
MICRO NUMBER: 90263300
SHIGA RESULT: NOT DETECTED
SPECIMEN QUALITY: ADEQUATE
SPECIMEN QUALITY:: ADEQUATE
SPECIMEN QUALITY:: ADEQUATE

## 2017-11-28 DIAGNOSIS — H524 Presbyopia: Secondary | ICD-10-CM | POA: Diagnosis not present

## 2017-11-28 DIAGNOSIS — Z961 Presence of intraocular lens: Secondary | ICD-10-CM | POA: Diagnosis not present

## 2017-11-28 DIAGNOSIS — H40013 Open angle with borderline findings, low risk, bilateral: Secondary | ICD-10-CM | POA: Diagnosis not present

## 2017-12-05 DIAGNOSIS — D225 Melanocytic nevi of trunk: Secondary | ICD-10-CM | POA: Diagnosis not present

## 2017-12-05 DIAGNOSIS — L438 Other lichen planus: Secondary | ICD-10-CM | POA: Diagnosis not present

## 2017-12-05 DIAGNOSIS — D1801 Hemangioma of skin and subcutaneous tissue: Secondary | ICD-10-CM | POA: Diagnosis not present

## 2017-12-05 DIAGNOSIS — L820 Inflamed seborrheic keratosis: Secondary | ICD-10-CM | POA: Diagnosis not present

## 2017-12-10 ENCOUNTER — Encounter: Payer: Self-pay | Admitting: Family Medicine

## 2017-12-12 MED ORDER — ROPINIROLE HCL 0.5 MG PO TABS: ORAL_TABLET | ORAL | 2 refills | Status: DC

## 2017-12-30 ENCOUNTER — Other Ambulatory Visit: Payer: Self-pay | Admitting: Family Medicine

## 2018-01-29 ENCOUNTER — Ambulatory Visit: Payer: Medicare Other | Admitting: Family Medicine

## 2018-02-02 ENCOUNTER — Other Ambulatory Visit: Payer: Self-pay | Admitting: Family Medicine

## 2018-02-02 MED ORDER — ATORVASTATIN CALCIUM 10 MG PO TABS: 10.0000 mg | ORAL_TABLET | Freq: Every day | ORAL | 3 refills | Status: DC

## 2018-02-11 DIAGNOSIS — N3001 Acute cystitis with hematuria: Secondary | ICD-10-CM | POA: Diagnosis not present

## 2018-02-11 DIAGNOSIS — R300 Dysuria: Secondary | ICD-10-CM | POA: Diagnosis not present

## 2018-04-02 ENCOUNTER — Other Ambulatory Visit: Payer: Self-pay | Admitting: Family Medicine

## 2018-04-02 ENCOUNTER — Encounter: Payer: Self-pay | Admitting: Family Medicine

## 2018-04-02 DIAGNOSIS — Z9889 Other specified postprocedural states: Secondary | ICD-10-CM

## 2018-04-03 ENCOUNTER — Ambulatory Visit: Payer: Medicare Other | Admitting: Family Medicine

## 2018-04-06 ENCOUNTER — Ambulatory Visit (INDEPENDENT_AMBULATORY_CARE_PROVIDER_SITE_OTHER): Payer: Medicare Other | Admitting: Family Medicine

## 2018-04-06 ENCOUNTER — Encounter: Payer: Self-pay | Admitting: Family Medicine

## 2018-04-06 ENCOUNTER — Other Ambulatory Visit: Payer: Self-pay

## 2018-04-06 VITALS — BP 112/72 | HR 72 | Temp 98.1°F | Ht 65.0 in | Wt 196.6 lb

## 2018-04-06 DIAGNOSIS — K219 Gastro-esophageal reflux disease without esophagitis: Secondary | ICD-10-CM | POA: Diagnosis not present

## 2018-04-06 DIAGNOSIS — G2581 Restless legs syndrome: Secondary | ICD-10-CM | POA: Diagnosis not present

## 2018-04-06 DIAGNOSIS — E7849 Other hyperlipidemia: Secondary | ICD-10-CM | POA: Diagnosis not present

## 2018-04-06 DIAGNOSIS — R002 Palpitations: Secondary | ICD-10-CM

## 2018-04-06 DIAGNOSIS — R197 Diarrhea, unspecified: Secondary | ICD-10-CM | POA: Diagnosis not present

## 2018-04-06 DIAGNOSIS — Z8619 Personal history of other infectious and parasitic diseases: Secondary | ICD-10-CM

## 2018-04-06 LAB — COMPREHENSIVE METABOLIC PANEL
ALBUMIN: 4.4 g/dL (ref 3.5–5.2)
ALK PHOS: 96 U/L (ref 39–117)
ALT: 19 U/L (ref 0–35)
AST: 14 U/L (ref 0–37)
BUN: 12 mg/dL (ref 6–23)
CO2: 26 mEq/L (ref 19–32)
CREATININE: 0.75 mg/dL (ref 0.40–1.20)
Calcium: 9.3 mg/dL (ref 8.4–10.5)
Chloride: 104 mEq/L (ref 96–112)
GFR: 80.92 mL/min (ref >60.00)
GLUCOSE: 105 mg/dL — AB (ref 70–99)
Potassium: 4.8 mEq/L (ref 3.5–5.1)
SODIUM: 140 meq/L (ref 135–145)
TOTAL PROTEIN: 6.7 g/dL (ref 6.0–8.3)
Total Bilirubin: 0.4 mg/dL (ref 0.2–1.2)

## 2018-04-06 LAB — CBC WITH DIFFERENTIAL/PLATELET
BASOS ABS: 0 10*3/uL (ref 0.0–0.1)
Basophils Relative: 0.4 % (ref 0.0–3.0)
EOS ABS: 0.1 10*3/uL (ref 0.0–0.7)
EOS PCT: 1.4 % (ref 0.0–5.0)
HCT: 40.3 % (ref 36.0–46.0)
HEMOGLOBIN: 13.7 g/dL (ref 12.0–15.0)
LYMPHS ABS: 1.4 10*3/uL (ref 0.7–4.0)
Lymphocytes Relative: 25.5 % (ref 12.0–46.0)
MCHC: 34 g/dL (ref 30.0–36.0)
MCV: 90.9 fl (ref 78.0–100.0)
MONO ABS: 0.3 10*3/uL (ref 0.1–1.0)
Monocytes Relative: 6.4 % (ref 3.0–12.0)
NEUTROS PCT: 66.3 % (ref 43.0–77.0)
Neutro Abs: 3.6 10*3/uL (ref 1.4–7.7)
Platelets: 331 10*3/uL (ref 150.0–400.0)
RBC: 4.44 Mil/uL (ref 3.87–5.11)
RDW: 12.6 % (ref 11.5–15.5)
WBC: 5.4 10*3/uL (ref 4.0–10.5)

## 2018-04-06 LAB — TSH: TSH: 3.14 u[IU]/mL (ref 0.35–4.50)

## 2018-04-06 MED ORDER — ROPINIROLE HCL 2 MG PO TABS: 2.0000 mg | ORAL_TABLET | Freq: Every day | ORAL | 3 refills | Status: DC

## 2018-04-06 MED ORDER — TRAMADOL HCL 50 MG PO TABS: 50.0000 mg | ORAL_TABLET | Freq: Every day | ORAL | 3 refills | Status: DC | PRN

## 2018-04-06 NOTE — Patient Instructions (Addendum)
Please return if you have more palpitations. Make a follow up appointment in 3 months for recheck GERD and palpitations and to see if you are tolerating your lipitor.   Start lipitor once a week and go up from there if you can.  I've increased your requip dose to help your RLS.  Tramadol has been refilled.   We will call you with information regarding your referral appointment. [facility]. If you do not hear from [name] within the next 2 weeks, please let me know. It can take 1-2 weeks to get appointments set up with the specialists.   I will release your lab results to you on your MyChart account with further instructions. Please reply with any questions.   If you have any questions or concerns, please don't hesitate to send me a message via MyChart or call the office at [phone]. Thank you for visiting with [name] today! It's our pleasure caring for you.

## 2018-04-06 NOTE — Progress Notes (Signed)
Subjective  CC:  Chief Complaint  Patient presents with  . Diarrhea    off and on  for months, Patient states that the majority of her stools are loose  . Fatigue    x a couple weeks, feels tired all the time     HPI: Tara Murphy is a 72 y.o. female who presents to the office today to address the problems listed above in the chief complaint.  Persistent loose stools for almost a year. See last visit. Evaluated here in February for same. Testing was negative and she never returned. Now has had a few episodes of fecal incontinence. No pain or blood or mucous; has tried probiotics. H/o c.diff treated a few years ago.   Feels tired. Stressed about above. ROS + palpitations w/o associated sxs; improves with coughing. + GERD improved with omeprazole but stops taking it after a few weeks, + right foot pain requiring intermittent ultram: OA and sees ortho who has discussed possibility of surgery for her, also toe problems and PF  HLD: didn't tolerate daily lipitor or crestor but it is hard for her to be certain given foot pain and RLS  RLS: improving on requip; taking 1mg  nightly. Still with sxs   Assessment  1. Diarrhea, unspecified type   2. History of Clostridium difficile colitis   3. Familial hyperlipidemia   4. Gastroesophageal reflux disease without esophagitis   5. Palpitations   6. Restless leg syndrome      Plan   Persistent Diarrhea:  GI referral; pt now ready for further evaluation  HLD: trial of lipitor weekly then titrate up from there.   GERD: rec daily prilosec  Palpitations: recurrent; had cardiac work up for same several years ago. ? Related to dehydration, stress or other. Check labs. Return for EKG and or holter if persist. No sxs of CAD  RLS: increase requip dosing  Foot pain: refilled tramadol for prn use.  Follow up: Return in about 3 months (around 07/07/2018) for recheck.   Orders Placed This Encounter  Procedures  . CBC with  Differential/Platelet  . Comprehensive metabolic panel  . TSH  . Hepatitis C antibody  . Ambulatory referral to Gastroenterology   Meds ordered this encounter  Medications  . rOPINIRole (REQUIP) 2 MG tablet    Sig: Take 1 tablet (2 mg total) by mouth at bedtime for 7 days.    Dispense:  90 tablet    Refill:  3    Dose change  . traMADol (ULTRAM) 50 MG tablet    Sig: Take 1 tablet (50 mg total) by mouth daily as needed.    Dispense:  30 tablet    Refill:  3      I reviewed the patients updated PMH, FH, and SocHx.    Patient Active Problem List   Diagnosis Date Noted  . Familial hyperlipidemia 11/20/2017    Priority: High  . History of left breast cancer 03/20/2015    Priority: High  . GERD (gastroesophageal reflux disease) 11/20/2017    Priority: Medium  . Restless leg syndrome 11/20/2017    Priority: Medium  . OA (osteoarthritis) of knee 12/18/2015    Priority: Medium  . OA (osteoarthritis) of hip 12/18/2015    Priority: Medium  . Osteoarthritis of spine with radiculopathy, 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    Priority: Medium  . History of Clostridium difficile colitis 06/16/2015    Priority: Medium  . 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 Neuropathy, peripheral, idiopathic 06/13/2015    Priority: Medium  . 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 Essential tremor 09/26/2016    Priority: Low  . Chronic allergic rhinitis 06/13/2015    Priority: Low  . Recurrent urinary tract infection 06/13/2015    Priority: Low   Current Meds  Medication Sig  . acetaminophen (TYLENOL) 500 MG tablet Take 500 mg by mouth every 6 (six) hours as needed (for pain.).  Marland Kitchen clobetasol (TEMOVATE) 0.05 % external solution Apply 1 application topically 2 (two) times daily as needed (for scalp inflammation).   . fluticasone (FLONASE) 50 MCG/ACT nasal spray Place 1 spray into the nose daily as needed for allergies or  rhinitis.   Marland Kitchen gabapentin (NEURONTIN) 300 MG capsule Take 2 capsules (600 mg total) by mouth 2 (two) times daily.  Marland Kitchen rOPINIRole (REQUIP) 2 MG tablet Take 1 tablet (2 mg total) by mouth at bedtime for 7 days.  . traMADol (ULTRAM) 50 MG tablet Take 1 tablet (50 mg total) by mouth daily as needed.  . [DISCONTINUED] rOPINIRole (REQUIP) 0.5 MG tablet Take 1 tablet (0.5 mg total) by mouth at bedtime for 7 days, THEN 2 tablets (1 mg total) at bedtime.  . [DISCONTINUED] traMADol (ULTRAM) 50 MG tablet TAKE ONE TABLET BY MOUTH EVERY 6 HOURS AS NEEDED FOR MODERATE PAIN    Allergies: Patient is allergic to contrast media [iodinated diagnostic agents]; hydromorphone hcl; other; sulfamethoxazole-trimethoprim; fentanyl; tape; and terbinafine and related. Family History: Patient family history includes Arthritis in her father and mother; Breast cancer in her sister; Diabetes in her mother; Hyperlipidemia in her maternal aunt; Lung cancer in her father. Social History:  Patient  reports that she quit smoking about 26 years ago. Her smoking use included cigarettes. She has never used smokeless tobacco. She reports that she drinks about 2.4 oz of alcohol per week. She reports that she does not use drugs.  Review of Systems: Constitutional: Negative for fever malaise or anorexia Cardiovascular: negative for chest pain Respiratory: negative for SOB or persistent cough Gastrointestinal: negative for abdominal pain  Objective  Vitals: BP 112/72   Pulse 72   Temp 98.1 F (36.7 C)   Ht 5\' 5"  (1.651 m)   Wt 196 lb 9.6 oz (89.2 kg)   SpO2 98%   BMI 32.72 kg/m  General: no acute distress , A&Ox3 HEENT: PEERL, conjunctiva normal, Oropharynx moist,neck is supple Cardiovascular:  RRR with 2/6 systolic murmur no gallop.  Respiratory:  Good breath sounds bilaterally, CTAB with normal respiratory effort Skin:  Warm, no rashes  No visits with results within 1 Day(s) from this visit.  Latest known visit with  results is:  Office Visit on 11/20/2017  Component Date Value Ref Range Status  . Toxigenic C. Difficile by PCR 11/20/2017 NOT DETECTED  NOT DETECT Final  . MICRO NUMBER: 11/20/2017 [id number]   Final  . SPECIMEN QUALITY: 11/20/2017 ADEQUATE   Final  . SOURCE: 11/20/2017 STOOL   Final  . STATUS: 11/20/2017 FINAL   Final  . SHIGA RESULT: 11/20/2017 Not Detected   Final  . MICRO NUMBER: 11/20/2017 [id number]   Final  . SPECIMEN QUALITY: 11/20/2017 ADEQUATE   Final  . Source 11/20/2017 STOOL   Final  . STATUS: 11/20/2017 FINAL   Final  . CAM RESULT: 11/20/2017 No enteric Campylobacter isolated   Final  . MICRO NUMBER: 11/20/2017 [id number]   Final  . SPECIMEN QUALITY: 11/20/2017 ADEQUATE  Final  . SOURCE: 11/20/2017 STOOL   Final  . STATUS: 11/20/2017 FINAL   Final  . SS RESULT: 11/20/2017 No Salmonella or Shigella isolated   Final  . MICRO NUMBER: 11/20/2017 [id number]   Final  . SPECIMEN QUALITY: 11/20/2017 ADEQUATE   Final  . Source 11/20/2017 STOOL   Final  . STATUS: 11/20/2017 FINAL   Final  . Fecal Lactoferrin 11/20/2017 Negative   Final  . COMMENT: 11/20/2017    Final                   Value:Lactoferrin in the stool is a marker for fecal leukocytes and is a non-specific indicator of intestinal inflammation that may be detected in patients with acute infectious colitis or inflammatory bowel disease. The diagnosis of an acute infectious  process or active IBD cannot be established solely on the basis of a positive result. This test may not be appropriate for immunocompromised persons. In addition, this test is not FDA cleared for patients with a history of HIV and/or Hepatitis B and C,  patients with a history of infectious diarrhea (within 6 months), and patients having had a colostomy and/or ileostomy within 1 month.   . QUESTION/PROBLEM: 11/20/2017    Final  . SPECIMEN(S) RECEIVED: 11/20/2017 SA,CB   Final       Commons side effects, risks, benefits, and alternatives for medications  and treatment plan prescribed today were discussed, and the patient expressed understanding of the given instructions. Patient is instructed to call or message via MyChart if he/she has any questions or concerns regarding our treatment plan. No barriers to understanding were identified. We discussed Red Flag symptoms and signs in detail. Patient expressed understanding regarding what to do in case of urgent or emergency type symptoms.   Medication list was reconciled, printed and provided to the patient in AVS. Patient instructions and summary information was reviewed with the patient as documented in the AVS. This note was prepared with assistance of Dragon voice recognition software. Occasional wrong-word or sound-a-like substitutions may have occurred due to the inherent limitations of voice recognition software

## 2018-04-07 ENCOUNTER — Encounter: Payer: Self-pay | Admitting: Gastroenterology

## 2018-04-07 LAB — HEPATITIS C ANTIBODY
Hepatitis C Ab: NONREACTIVE
SIGNAL TO CUT-OFF: 0.01 (ref <1.00)

## 2018-04-13 DIAGNOSIS — M19071 Primary osteoarthritis, right ankle and foot: Secondary | ICD-10-CM | POA: Diagnosis not present

## 2018-04-13 DIAGNOSIS — M2040 Other hammer toe(s) (acquired), unspecified foot: Secondary | ICD-10-CM | POA: Insufficient documentation

## 2018-04-22 ENCOUNTER — Other Ambulatory Visit: Payer: Self-pay | Admitting: Family Medicine

## 2018-04-23 ENCOUNTER — Telehealth: Payer: Self-pay | Admitting: Family Medicine

## 2018-04-23 MED ORDER — NITROFURANTOIN MONOHYD MACRO 100 MG PO CAPS: 100.0000 mg | ORAL_CAPSULE | Freq: Two times a day (BID) | ORAL | 0 refills | Status: DC

## 2018-04-23 NOTE — Telephone Encounter (Signed)
Patient informed   Doloris Hall,  LPN

## 2018-04-23 NOTE — Telephone Encounter (Signed)
Last uti 01/2018 Refilled macrobid bid x 5 days.   OV if sxs persist or worsen. Please notify pt

## 2018-04-23 NOTE — Telephone Encounter (Signed)
Spoke with Patient and she states that she wants a refill an antibiotic for a UTI, she states that she feels like one is coming on. She states that she had a prescription for when she felt a UTI coming on. I advised patient that she may have to come in for a OV. Please advise.   Doloris Hall,  LPN

## 2018-04-23 NOTE — Telephone Encounter (Signed)
Copied from [place] [id number]. Topic: Quick Communication - See Telephone Encounter >> Apr 23, 2018  2:41 PM Neva Seat wrote: Pt is needing a refill on her UTI antibiotic medication.

## 2018-04-28 DIAGNOSIS — M19071 Primary osteoarthritis, right ankle and foot: Secondary | ICD-10-CM | POA: Diagnosis not present

## 2018-05-13 ENCOUNTER — Ambulatory Visit
Admission: RE | Admit: 2018-05-13 | Discharge: 2018-05-13 | Disposition: A | Discharge status: Home / Self Care | Payer: Medicare Other | Source: Ambulatory Visit | Attending: Family Medicine | Admitting: Family Medicine

## 2018-05-13 DIAGNOSIS — R928 Other abnormal and inconclusive findings on diagnostic imaging of breast: Secondary | ICD-10-CM | POA: Diagnosis not present

## 2018-05-13 DIAGNOSIS — Z9889 Other specified postprocedural states: Secondary | ICD-10-CM

## 2018-05-14 ENCOUNTER — Other Ambulatory Visit: Payer: Self-pay | Admitting: Family Medicine

## 2018-05-19 ENCOUNTER — Encounter: Payer: Self-pay | Admitting: Family Medicine

## 2018-05-19 ENCOUNTER — Ambulatory Visit (INDEPENDENT_AMBULATORY_CARE_PROVIDER_SITE_OTHER): Payer: Medicare Other | Admitting: Family Medicine

## 2018-05-19 ENCOUNTER — Other Ambulatory Visit: Payer: Self-pay

## 2018-05-19 VITALS — BP 110/72 | HR 83 | Temp 97.7°F | Ht 65.0 in | Wt 199.6 lb

## 2018-05-19 DIAGNOSIS — Z8619 Personal history of other infectious and parasitic diseases: Secondary | ICD-10-CM | POA: Diagnosis not present

## 2018-05-19 DIAGNOSIS — R300 Dysuria: Secondary | ICD-10-CM | POA: Diagnosis not present

## 2018-05-19 DIAGNOSIS — K529 Noninfective gastroenteritis and colitis, unspecified: Secondary | ICD-10-CM | POA: Diagnosis not present

## 2018-05-19 LAB — URINALYSIS, MICROSCOPIC ONLY

## 2018-05-19 MED ORDER — NITROFURANTOIN MONOHYD MACRO 100 MG PO CAPS: 100.0000 mg | ORAL_CAPSULE | Freq: Two times a day (BID) | ORAL | 0 refills | Status: DC

## 2018-05-19 NOTE — Progress Notes (Signed)
Subjective   CC:  Chief Complaint  Patient presents with  . Dysuria    x 3 days    HPI: Tara Murphy is a 72 y.o. female who presents to the office today to address the problems listed above in the chief complaint.  Patient reports dysuria and urinary frequency.  She has sensation of increased urinary pressure.  She denies fevers flank pain nausea vomiting or gross hematuria.  Symptoms have been present for several hours to days.  She denies history of interstitial cystitis.  She denies vaginal symptoms including vaginal discharge or pelvic pain.  Last UTI was in May.  She gets them intermittently.  Believes secondary to problems with intermittent diarrhea.  Has been taking AZO  Diarrhea with mucoid stools: Has GI appointment scheduled for September.  Assessment  1. Dysuria      Plan   Likely UTI: Treat with nitrofurantoin.  Monitor for recurrence.  Call with next infection.  If become more prevalent will consider prophylactic antibiotics.  Diarrhea with mucoid stool: Needs colonoscopy and GI eval.  Patient has upcoming appointment scheduled.  She is currently stable  Health maintenance: Due for flu shot, she will return for high dose next week.  Annual wellness visit and Medicare lab physical in January.  Follow up: January for complete physical Orders Placed This Encounter  Procedures  . Urine Culture  . Urine Microscopic   Meds ordered this encounter  Medications  . nitrofurantoin, macrocrystal-monohydrate, (MACROBID) 100 MG capsule    Sig: Take 1 capsule (100 mg total) by mouth 2 (two) times daily.    Dispense:  10 capsule    Refill:  0      I reviewed the patients updated PMH, FH, and SocHx.    Patient Active Problem List   Diagnosis Date Noted  . Familial hyperlipidemia 11/20/2017    Priority: High  . History of left breast cancer 03/20/2015    Priority: High  . GERD (gastroesophageal reflux disease) 11/20/2017    Priority: Medium  . Restless leg  syndrome 11/20/2017    Priority: Medium  . OA (osteoarthritis) of knee 12/18/2015    Priority: Medium  . OA (osteoarthritis) of hip 12/18/2015    Priority: Medium  . Osteoarthritis of spine with radiculopathy, 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    Priority: Medium  . History of Clostridium difficile colitis 06/16/2015    Priority: Medium  . 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 Neuropathy, peripheral, idiopathic 06/13/2015    Priority: Medium  . 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 Essential tremor 09/26/2016    Priority: Low  . Chronic allergic rhinitis 06/13/2015    Priority: Low  . Recurrent urinary tract infection 06/13/2015    Priority: Low   Current Meds  Medication Sig  . acetaminophen (TYLENOL) 500 MG tablet Take 500 mg by mouth every 6 (six) hours as needed (for pain.).  Marland Kitchen atorvastatin (LIPITOR) 10 MG tablet Take 1 tablet (10 mg total) by mouth daily.  . clobetasol (TEMOVATE) 0.05 % external solution Apply 1 application topically 2 (two) times daily as needed (for scalp inflammation).   . fluticasone (FLONASE) 50 MCG/ACT nasal spray Place 1 spray into the nose daily as needed for allergies or rhinitis.   Marland Kitchen gabapentin (NEURONTIN) 300 MG capsule Take 2 capsules (600 mg total) by mouth 2 (two) times daily.  Marland Kitchen rOPINIRole (REQUIP) 2 MG tablet Take 1 tablet (  2 mg total) by mouth at bedtime for 7 days.  . traMADol (ULTRAM) 50 MG tablet Take 1 tablet (50 mg total) by mouth daily as needed.    Review of Systems: Cardiovascular: negative for chest pain Respiratory: negative for SOB or persistent cough Gastrointestinal: negative for abdominal pain Constitutional: Negative for fever malaise or anorexia  Objective  Vitals: BP 110/72   Pulse 83   Temp 97.7 F (36.5 C)   Ht 5\' 5"  (1.651 m)   Wt 199 lb 9.6 oz (90.5 kg)   SpO2 96%   BMI 33.22 kg/m  General: no acute distress  Psych:  Alert and  oriented, normal mood and affect Cardiovascular:  RRR without murmur or gallop. no peripheral edema Respiratory:  Good breath sounds bilaterally, CTAB with normal respiratory effort Gastrointestinal: soft, flat abdomen, normal active bowel sounds, no palpable masses, no hepatosplenomegaly, no appreciated hernias, NO CVAT, mild suprapubic ttp w/o rebound or guarding Skin:  Warm, no rashes Neurologic:   Mental status is normal. normal gait   Commons side effects, risks, benefits, and alternatives for medications and treatment plan prescribed today were discussed, and the patient expressed understanding of the given instructions. Patient is instructed to call or message via MyChart if he/she has any questions or concerns regarding our treatment plan. No barriers to understanding were identified. We discussed Red Flag symptoms and signs in detail. Patient expressed understanding regarding what to do in case of urgent or emergency type symptoms.   Medication list was reconciled, printed and provided to the patient in AVS. Patient instructions and summary information was reviewed with the patient as documented in the AVS. This note was prepared with assistance of Dragon voice recognition software. Occasional wrong-word or sound-a-like substitutions may have occurred due to the inherent limitations of voice recognition software

## 2018-05-19 NOTE — Patient Instructions (Addendum)
Please follow up if symptoms do not improve or as needed.   Complete your antibiotics.   Return for your flu shot.

## 2018-05-21 LAB — URINE CULTURE
MICRO NUMBER:: 91026476
SPECIMEN QUALITY:: ADEQUATE

## 2018-05-22 MED ORDER — CEFDINIR 300 MG PO CAPS: 300.0000 mg | ORAL_CAPSULE | Freq: Two times a day (BID) | ORAL | 0 refills | Status: DC

## 2018-05-22 NOTE — Addendum Note (Signed)
Addended by: Billey Chang on: 05/22/2018 09:55 AM   Modules accepted: Orders

## 2018-05-27 ENCOUNTER — Ambulatory Visit: Payer: Medicare Other

## 2018-06-02 ENCOUNTER — Encounter: Payer: Self-pay | Admitting: Gastroenterology

## 2018-06-02 ENCOUNTER — Ambulatory Visit (INDEPENDENT_AMBULATORY_CARE_PROVIDER_SITE_OTHER): Payer: Medicare Other | Admitting: Gastroenterology

## 2018-06-02 VITALS — BP 128/76 | HR 70 | Ht 66.0 in | Wt 200.2 lb

## 2018-06-02 DIAGNOSIS — R1319 Other dysphagia: Secondary | ICD-10-CM

## 2018-06-02 DIAGNOSIS — K219 Gastro-esophageal reflux disease without esophagitis: Secondary | ICD-10-CM | POA: Diagnosis not present

## 2018-06-02 DIAGNOSIS — K529 Noninfective gastroenteritis and colitis, unspecified: Secondary | ICD-10-CM

## 2018-06-02 DIAGNOSIS — R1310 Dysphagia, unspecified: Secondary | ICD-10-CM | POA: Diagnosis not present

## 2018-06-02 MED ORDER — PEG-KCL-NACL-NASULF-NA ASC-C 140 G PO SOLR: 140.0000 g | ORAL | 0 refills | Status: DC

## 2018-06-02 MED ORDER — DICYCLOMINE HCL 10 MG PO CAPS: 10.0000 mg | ORAL_CAPSULE | ORAL | 1 refills | Status: DC | PRN

## 2018-06-02 NOTE — Progress Notes (Signed)
[facility] Consult Note:  HistoryMackena Murphy 06/02/2018  Referring physician: Leamon Arnt, MD  Reason for consult/chief complaint: Diarrhea (with mucous); Abdominal Pain; Hemorrhoids; Rectal Bleeding; and Encopresis   Subjective  HPI:  This is a very pleasant 72 year old woman referred by primary care for chronic diarrhea and some additional symptoms.  She has had perhaps 2 years of chronic diarrhea with frequent passage of mucus.  She typically has several BMs per day, often with urgency after meals.  She had C. difficile at some point in the past, which she relates to multiple rounds of antibiotics for recurrent urinary infections that have occurred for decades.  The diarrhea started prior to her cholecystectomy in February 2018.  She was becoming increasingly concerned because her mother had Crohn's disease and her brother is irritable bowel.  She reports 2 prior colonoscopies, the most recent about 8 years ago in [place].  She recalls that it was a normal study. She has occasionally taken Imodium, but was reluctant to do so on a regular basis.  In addition, Tara Murphy reports several years of recurrent heartburn, where it may bother her for days or a week at a time and she will need to take Zantac or Prilosec.  She then feels well for weeks before it recurs.  She also has the feeling of a fullness in the neck and is if it is difficult to get food down at times.  Appetite is been good and weight stable.  ROS:  Review of Systems  Constitutional: Positive for fatigue. Negative for appetite change and unexpected weight change.  HENT: Negative for mouth sores and voice change.   Eyes: Negative for pain and redness.  Respiratory: Negative for cough and shortness of breath.   Cardiovascular: Negative for chest pain and palpitations.  Genitourinary: Negative for dysuria and hematuria.  Musculoskeletal: Positive for arthralgias. Negative for myalgias.  Skin:  Negative for pallor and rash.  Neurological: Negative for weakness and headaches.  Hematological: Negative for adenopathy.     Past Medical History: Past Medical History:  Diagnosis Date  . Arthritis   . 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HCC)    S/p total hip replacement left  . Breast cancer ([facility]) 2015   left   . Cataract   . Cholecystolithiasis   . Chronic allergic rhinitis   . Depression   . GERD (gastroesophageal reflux disease)   . Hyperlipemia   . Lichen plano-pilaris   . 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 Neuropathy, peripheral, idiopathic    uses gabapentin  . Personal history of radiation therapy 2015  . Urinary tract infection    hx of frequent      Past Surgical History: Past Surgical History:  Procedure Laterality Date  . APPENDECTOMY    . BREAST LUMPECTOMY Left 2015  . cataracts    . CHOLECYSTECTOMY N/A 10/29/2016   Procedure: LAPAROSCOPIC CHOLECYSTECTOMY;  Surgeon: Erroll Luna, MD;  Location: [facility];  Service: General;  Laterality: N/A;  . LIPOSUCTION    . TOTAL HIP ARTHROPLASTY     bilateral hip arthoplasty  . TOTAL HIP ARTHROPLASTY Right 12/18/2015   Procedure: RIGHT TOTAL HIP ARTHROPLASTY ANTERIOR APPROACH;  Surgeon: Gaynelle Arabian, MD;  Location: [facility];  Service: Orthopedics;  Laterality: Right;  . TOTAL KNEE ARTHROPLASTY    . TOTAL KNEE ARTHROPLASTY Left 11/25/2016   Procedure: LEFT TOTAL KNEE ARTHROPLASTY;  Surgeon: Gaynelle Arabian, MD;  Location: [facility];  Service: Orthopedics;  Laterality: Left;  with abductor  block  . TUBAL LIGATION       Family History: Family History  Problem Relation Age of Onset  . Arthritis Mother   . Diabetes Mother   . Arthritis Father   . Lung cancer Father   . Breast cancer Sister 27  . Hyperlipidemia Maternal Aunt     Social History: Social History   Socioeconomic History  . Marital status: Married    Spouse name: Not on file  . Number of children: 2  . Years of education: Not on file  . Highest  education level: Not on file  Occupational History  . Not on file  Social Needs  . Financial resource strain: Not on file  . Food insecurity:    Worry: Not on file    Inability: Not on file  . Transportation needs:    Medical: Not on file    Non-medical: Not on file  Tobacco Use  . Smoking status: Former Smoker    Types: Cigarettes    Last attempt to quit: 11/22/1991    Years since quitting: 26.5  . Smokeless tobacco: Never Used  Substance and Sexual Activity  . Alcohol use: Yes    Alcohol/week: 4.0 standard drinks    Types: 4 Glasses of wine per week  . Drug use: No  . Sexual activity: Not on file  Lifestyle  . Physical activity:    Days per week: Not on file    Minutes per session: Not on file  . Stress: Not on file  Relationships  . Social connections:    Talks on phone: Not on file    Gets together: Not on file    Attends religious service: Not on file    Active member of club or organization: Not on file    Attends meetings of clubs or organizations: Not on file    Relationship status: Not on file  Other Topics Concern  . Not on file  Social History Narrative  . Not on file    Allergies: Allergies  Allergen Reactions  . Contrast Media [Iodinated Diagnostic Agents] Hives  . Hydromorphone Hcl Nausea Only  . Other Rash    Certain Band-Aids don't agree with the patient's skin; hospital ID wristband causes rashes  . Sulfamethoxazole-Trimethoprim Hives  . Fentanyl Other (See Comments)    Redness and flushing over body   . Tape Rash    Certain Band-Aids don't agree with the patient's skin; hospital ID wristband causes rashes  . Terbinafine And Related Hives and Rash    Outpatient Meds: Current Outpatient Medications  Medication Sig Dispense Refill  . acetaminophen (TYLENOL) 500 MG tablet Take 500 mg by mouth every 6 (six) hours as needed (for pain.).    Marland Kitchen atorvastatin (LIPITOR) 10 MG tablet Take 1 tablet (10 mg total) by mouth daily. 90 tablet 3  . clobetasol  (TEMOVATE) 0.05 % external solution Apply 1 application topically 2 (two) times daily as needed (for scalp inflammation).     . fluticasone (FLONASE) 50 MCG/ACT nasal spray Place 1 spray into the nose daily as needed for allergies or rhinitis.     Marland Kitchen gabapentin (NEURONTIN) 300 MG capsule Take 2 capsules (600 mg total) by mouth 2 (two) times daily. 360 capsule 0  . traMADol (ULTRAM) 50 MG tablet Take 1 tablet (50 mg total) by mouth daily as needed. 30 tablet 3  . dicyclomine (BENTYL) 10 MG capsule Take 1 capsule (10 mg total) by mouth as needed for spasms. Up to three times  daily 45 capsule 1  . PEG-KCl-NaCl-NaSulf-Na Asc-C (PLENVU) 140 g SOLR Take 140 g by mouth as directed. 1 each 0  . rOPINIRole (REQUIP) 2 MG tablet Take 1 tablet (2 mg total) by mouth at bedtime for 7 days. 90 tablet 3   No current facility-administered medications for this visit.       ___________________________________________________________________ Objective   Exam:  BP 128/76   Pulse 70   Ht 5\' 6"  (1.676 m)   Wt 200 lb 4 oz (90.8 kg)   BMI 32.32 kg/m    General: this is a(n) well-appearing woman  Eyes: sclera anicteric, no redness  ENT: oral mucosa moist without lesions, no cervical or supraclavicular lymphadenopathy, good dentition  CV: RRR with soft systolic murmur, M6/N8, no JVD, no peripheral edema  Resp: clear to auscultation bilaterally, normal RR and effort noted  GI: soft, no tenderness, with active bowel sounds. No guarding or palpable organomegaly noted.  Skin; warm and dry, no rash or jaundice noted  Neuro: awake, alert and oriented x 3. Normal gross motor function and fluent speech  Labs:  Fecal lactoferrin neg 2/19, neg C diff PCR  Assessment: Encounter Diagnoses  Name Primary?  . Chronic diarrhea Yes  . Gastroesophageal reflux disease, esophagitis presence not specified   . Esophageal dysphagia     The diarrhea sounds most like IBS, possibly microscopic colitis.  Less likely  bile acid diarrhea since it was going on prior to cholecystectomy. Intermittent GERD symptoms and what is either globus sensation or dysphagia.  Plan:  EGD with possible dilation and colonoscopy for biopsies.  She is agreeable after discussion of procedure and risks.  The benefits and risks of the planned procedure were described in detail with the patient or (when appropriate) their health care proxy.  Risks were outlined as including, but not limited to, bleeding, infection, perforation, adverse medication reaction leading to cardiac or pulmonary decompensation, or pancreatitis (if ERCP).  The limitation of incomplete mucosal visualization was also discussed.  No guarantees or warranties were given.  Trial of low-dose dicyclomine as needed for diarrhea.  Thank you for the courtesy of this consult.  Please call me with any questions or concerns.  Nelida Meuse III  CC: Leamon Arnt, MD

## 2018-06-02 NOTE — Patient Instructions (Addendum)
If you are age 71 or older, your body mass index should be between 23-30. Your Body mass index is 32.32 kg/m. If this is out of the aforementioned range listed, please consider follow up with your Primary Care Provider.  If you are age 4 or younger, your body mass index should be between 19-25. Your Body mass index is 32.32 kg/m. If this is out of the aformentioned range listed, please consider follow up with your Primary Care Provider.   It was a pleasure to see you today!  Dr. Loletha Carrow   Food Guidelines for a sensitive stomach  Many people have difficulty digesting certain foods, causing a variety of distressing and embarrassing symptoms such as abdominal pain, bloating and gas.  These foods may need to be avoided or consumed in small amounts.  Here are some tips that might be helpful for you.  1.   Lactose intolerance is the difficulty or complete inability to digest lactose, the natural sugar in milk and anything made from milk.  This condition is harmless, common, and can begin any time during life.  Some people can digest a modest amount of lactose while others cannot tolerate any.  Also, not all dairy products contain equal amounts of lactose.  For example, hard cheeses such as parmesan have less lactose than soft cheeses such as cheddar.  Yogurt has less lactose than milk or cheese.  Many packaged foods (even many brands of bread) have milk, so read ingredient lists carefully.  It is difficult to test for lactose intolerance, so just try avoiding lactose as much as possible for a week and see what happens with your symptoms.  If you seem to be lactose intolerant, the best plan is to avoid it (but make sure you get calcium from another source).  The next best thing is to use lactase enzyme supplements, available over the counter everywhere.  Just know that many lactose intolerant people need to take several tablets with each serving of dairy to avoid symptoms.  Lastly, a lot of restaurant food is  made with milk or butter.  Many are things you might not suspect, such as mashed potatoes, rice and pasta (cooked with butter) and "grilled" items.  If you are lactose intolerant, it never hurts to ask your server what has milk or butter.  2.   Fiber is an important part of your diet, but not all fiber is well-tolerated.  Insoluble fiber such as bran is often consumed by normal gut bacteria and converted into gas.  Soluble fiber such as oats, squash, carrots and green beans are typically tolerated better.  3.   Some types of carbohydrates can be poorly digested.  Examples include: fructose (apples, cherries, pears, raisins and other dried fruits), fructans (onions, zucchini, large amounts of wheat), sorbitol/mannitol/xylitol and sucralose/Splenda (common artificial sweeteners), and raffinose (lentils, broccoli, cabbage, asparagus, brussel sprouts, many types of beans).  Do a Development worker, community for [employer] and you will find helpful information. Beano, a dietary supplement, will often help with raffinose-containing foods.  As with lactase tablets, you may need several per serving.  4.   Whenever possible, avoid processed food&meats and chemical additives.  High fructose corn syrup, a common sweetener, may be difficult to digest.  Eggs and soy (comes from the soybean, and added to many foods now) are other common bloating/gassy foods.  - Dr. Herma [facility]

## 2018-06-04 ENCOUNTER — Telehealth: Payer: Self-pay | Admitting: Gastroenterology

## 2018-06-04 NOTE — Telephone Encounter (Signed)
Kristopher Oppenheim called to inform that [facility] needs PA.

## 2018-06-04 NOTE — Telephone Encounter (Signed)
Spoke to patient to let her know that insurance will not cover the bowel prep. I did get a coupon that she can come pick up to activate that will help with the cost coverage.

## 2018-06-08 ENCOUNTER — Telehealth: Payer: Self-pay | Admitting: Family Medicine

## 2018-06-08 DIAGNOSIS — N3000 Acute cystitis without hematuria: Secondary | ICD-10-CM | POA: Diagnosis not present

## 2018-06-08 DIAGNOSIS — R300 Dysuria: Secondary | ICD-10-CM | POA: Diagnosis not present

## 2018-06-08 NOTE — Telephone Encounter (Signed)
Please advise.   Repeat Urine?  Doloris Hall,  LPN

## 2018-06-08 NOTE — Telephone Encounter (Signed)
Spoke with Patient regarding UTI Symptoms, I advised her that she would need to come in for an appointment to assess symptoms. Patient states that she will go to express care to have urine done.   Tara Hall,  LPN

## 2018-06-08 NOTE — Telephone Encounter (Signed)
Would need appointment

## 2018-06-08 NOTE — Telephone Encounter (Signed)
Copied from [place] [phone]. Topic: Quick Communication - Rx Refill/Question >> Jun 08, 2018  9:54 AM Reyne Dumas L wrote: Medication: Unsure - but last given for UTI  Has the patient contacted their pharmacy?  No - UTI seems to be coming back and she wants to know if she can have another round of the last medication that was given to her (Agent: If no, request that the patient contact the pharmacy for the refill.) (Agent: If yes, when and what did the pharmacy advise?)  Preferred Pharmacy (with phone number or street name): [facility] [address], [place] - [place] [phone] (Phone) [phone] (Fax)  Agent: Please be advised that RX refills may take up to 3 business days. We ask that you follow-up with your pharmacy.

## 2018-06-08 NOTE — Telephone Encounter (Signed)
Patient is calling with UTI symptoms- patient feels she never really cleared the previous UTI. Patient did finish the antibiotic in August - but didn't feel it had completely cleared- she did not follow up. Patient reports she has had awareness of slight symptoms  for a couple days- but last night she started having symptoms- spasms, urgency, some pain/burning with urination. Patient is using pyridium she had left over- she wants to be treated again. Told patient that she may be required to come into the office- but she wants to request the antibiotic first- because she was just treated.

## 2018-06-29 ENCOUNTER — Other Ambulatory Visit: Payer: Self-pay | Admitting: Family Medicine

## 2018-07-01 ENCOUNTER — Other Ambulatory Visit: Payer: Self-pay | Admitting: Gastroenterology

## 2018-07-02 ENCOUNTER — Ambulatory Visit (AMBULATORY_SURGERY_CENTER): Payer: Medicare Other | Admitting: Gastroenterology

## 2018-07-02 ENCOUNTER — Encounter: Payer: Self-pay | Admitting: Gastroenterology

## 2018-07-02 VITALS — BP 138/69 | HR 69 | Temp 96.4°F | Resp 16 | Ht 66.0 in | Wt 200.0 lb

## 2018-07-02 DIAGNOSIS — K635 Polyp of colon: Secondary | ICD-10-CM | POA: Diagnosis not present

## 2018-07-02 DIAGNOSIS — R1310 Dysphagia, unspecified: Secondary | ICD-10-CM

## 2018-07-02 DIAGNOSIS — D122 Benign neoplasm of ascending colon: Secondary | ICD-10-CM

## 2018-07-02 DIAGNOSIS — E7800 Pure hypercholesterolemia, unspecified: Secondary | ICD-10-CM | POA: Diagnosis not present

## 2018-07-02 DIAGNOSIS — D120 Benign neoplasm of cecum: Secondary | ICD-10-CM

## 2018-07-02 DIAGNOSIS — R197 Diarrhea, unspecified: Secondary | ICD-10-CM | POA: Diagnosis not present

## 2018-07-02 DIAGNOSIS — K219 Gastro-esophageal reflux disease without esophagitis: Secondary | ICD-10-CM

## 2018-07-02 DIAGNOSIS — K529 Noninfective gastroenteritis and colitis, unspecified: Secondary | ICD-10-CM | POA: Diagnosis not present

## 2018-07-02 MED ORDER — SODIUM CHLORIDE 0.9 % IV SOLN: 500.0000 mL | Freq: Once | INTRAVENOUS | Status: DC

## 2018-07-02 NOTE — Progress Notes (Signed)
Called to room to assist during endoscopic procedure.  Patient ID and intended procedure confirmed with present staff. Received instructions for my participation in the procedure from the performing physician.  

## 2018-07-02 NOTE — Patient Instructions (Signed)
YOU HAD AN ENDOSCOPIC PROCEDURE TODAY AT [facility]:   Refer to the procedure report that was given to you for any specific questions about what was found during the examination.  If the procedure report does not answer your questions, please call your gastroenterologist to clarify.  If you requested that your care partner not be given the details of your procedure findings, then the procedure report has been included in a sealed envelope for you to review at your convenience later.  YOU SHOULD EXPECT: Some feelings of bloating in the abdomen. Passage of more gas than usual.  Walking can help get rid of the air that was put into your GI tract during the procedure and reduce the bloating. If you had a lower endoscopy (such as a colonoscopy or flexible sigmoidoscopy) you may notice spotting of blood in your stool or on the toilet paper. If you underwent a bowel prep for your procedure, you may not have a normal bowel movement for a few days.  Please Note:  You might notice some irritation and congestion in your nose or some drainage.  This is from the oxygen used during your procedure.  There is no need for concern and it should clear up in a day or so.  SYMPTOMS TO REPORT IMMEDIATELY:   Following lower endoscopy (colonoscopy or flexible sigmoidoscopy):  Excessive amounts of blood in the stool  Significant tenderness or worsening of abdominal pains  Swelling of the abdomen that is new, acute  Fever of 100F or higher   Following upper endoscopy (EGD)  Vomiting of blood or coffee ground material  New chest pain or pain under the shoulder blades  Painful or persistently difficult swallowing  New shortness of breath  Fever of 100F or higher  Black, tarry-looking stools  For urgent or emergent issues, a gastroenterologist can be reached at any hour by calling [phone].   DIET:  We do recommend a small meal at first, but then you may proceed to your regular diet.  Drink  plenty of fluids but you should avoid alcoholic beverages for 24 hours.  ACTIVITY:  You should plan to take it easy for the rest of today and you should NOT DRIVE or use heavy machinery until tomorrow (because of the sedation medicines used during the test).    FOLLOW UP: Our staff will call the number listed on your records the next business day following your procedure to check on you and address any questions or concerns that you may have regarding the information given to you following your procedure. If we do not reach you, we will leave a message.  However, if you are feeling well and you are not experiencing any problems, there is no need to return our call.  We will assume that you have returned to your regular daily activities without incident.  If any biopsies were taken you will be contacted by phone or by letter within the next 1-3 weeks.  Please call us at [phone] if you have not heard about the biopsies in 3 weeks.    SIGNATURES/CONFIDENTIALITY: You and/or your care partner have signed paperwork which will be entered into your electronic medical record.  These signatures attest to the fact that that the information above on your After Visit Summary has been reviewed and is understood.  Full responsibility of the confidentiality of this discharge information lies with you and/or your care-partner.   Resume medications. Information given on GERD AND POLYPS.

## 2018-07-02 NOTE — Progress Notes (Signed)
Report given to PACU, vss 

## 2018-07-02 NOTE — Op Note (Signed)
[place] Patient Name: Tara Murphy Procedure Date: 07/02/2018 2:26 PM MRN: [id number] Endoscopist: [place]. Loletha Carrow , MD Age: 72 Referring MD:  Date of Birth: [birth year] Gender: Female Account #: [id number] Procedure:                Upper GI endoscopy Indications:              Esophageal reflux symptoms that recur despite                            appropriate therapy Medicines:                Monitored Anesthesia Care Procedure:                Pre-Anesthesia Assessment:                           - Prior to the procedure, a History and Physical                            was performed, and patient medications and                            allergies were reviewed. The patient's tolerance of                            previous anesthesia was also reviewed. The risks                            and benefits of the procedure and the sedation                            options and risks were discussed with the patient.                            All questions were answered, and informed consent                            was obtained. Prior Anticoagulants: The patient has                            taken no previous anticoagulant or antiplatelet                            agents. ASA Grade Assessment: II - A patient with                            mild systemic disease. After reviewing the risks                            and benefits, the patient was deemed in                            satisfactory condition to undergo the procedure.  After obtaining informed consent, the endoscope was                            passed under direct vision. Throughout the                            procedure, the patient's blood pressure, pulse, and                            oxygen saturations were monitored continuously. The                            Endoscope was introduced through the mouth, and                            advanced to the second part of duodenum.  The upper                            GI endoscopy was accomplished without difficulty.                            The patient tolerated the procedure well. Scope In: Scope Out: Findings:                 The esophagus was normal.                           The stomach was normal.                           The cardia and gastric fundus were normal on                            retroflexion.                           The examined duodenum was normal. Complications:            No immediate complications. Estimated Blood Loss:     Estimated blood loss: none. Impression:               - Normal esophagus.                           - Normal stomach.                           - Normal examined duodenum.                           - No specimens collected. Recommendation:           - Patient has a contact number available for                            emergencies. The signs and symptoms of potential  delayed complications were discussed with the                            patient. Return to normal activities tomorrow.                            Written discharge instructions were provided to the                            patient.                           - Resume previous diet.                           - Continue present medications.                           - See the other procedure note for documentation of                            additional recommendations.                           - Follow an antireflux regimen indefinitely. Henry L. Loletha Carrow, MD 07/02/2018 3:35:26 PM This report has been signed electronically.

## 2018-07-02 NOTE — Op Note (Signed)
[place] Patient Name: Tara Murphy Procedure Date: 07/02/2018 2:26 PM MRN: [id number] Endoscopist: [place]. Loletha Carrow , MD Age: 72 Referring MD:  Date of Birth: [birth year] Gender: Female Account #: [id number] Procedure:                Colonoscopy Indications:              Chronic diarrhea Medicines:                Monitored Anesthesia Care Procedure:                Pre-Anesthesia Assessment:                           - Prior to the procedure, a History and Physical                            was performed, and patient medications and                            allergies were reviewed. The patient's tolerance of                            previous anesthesia was also reviewed. The risks                            and benefits of the procedure and the sedation                            options and risks were discussed with the patient.                            All questions were answered, and informed consent                            was obtained. Prior Anticoagulants: The patient has                            taken no previous anticoagulant or antiplatelet                            agents. ASA Grade Assessment: II - A patient with                            mild systemic disease. After reviewing the risks                            and benefits, the patient was deemed in                            satisfactory condition to undergo the procedure.                           After obtaining informed consent, the colonoscope  was passed under direct vision. Throughout the                            procedure, the patient's blood pressure, pulse, and                            oxygen saturations were monitored continuously. The                            Colonoscope was introduced through the anus and                            advanced to the the cecum, identified by                            appendiceal orifice and ileocecal valve. The                      colonoscopy was somewhat difficult due to                            significant looping. Successful completion of the                            procedure was aided by changing the patient's                            position and using manual pressure. The patient                            tolerated the procedure well. The quality of the                            bowel preparation was excellent. The ileocecal                            valve, appendiceal orifice, and rectum were                            photographed. Scope In: 2:51:35 PM Scope Out: 3:31:19 PM Scope Withdrawal Time: 0 hours 33 minutes 28 seconds  Total Procedure Duration: 0 hours 39 minutes 44 seconds  Findings:                 The digital rectal exam findings include decreased                            sphincter tone.                           The sigmoid colon was redundant.                           A 8 mm polyp was found in the cecum. The polyp was  sessile with a mucus cap. The polyp was mostly                            removed with a cold snare, then cold biopsy forceps                            used to remove remaining edge pieces. Resection and                            retrieval were complete. (Jar 1)                           A 3 mm polyp was found in the cecum. The polyp was                            sessile. The polyp was removed with a cold biopsy                            forceps. Resection and retrieval were complete.                            (Jar 2)                           A 10 mm polyp was found in the cecum. The polyp was                            flat with a mucus cap. The polyp was removed with a                            saline injection-lift technique using a hot snare.                            Resection and retrieval were complete. To prevent                            bleeding post-intervention, one hemostatic clip was                             successfully placed (MR conditional). (Jar 3)                           A 12 mm polyp was found in the ascending colon. The                            polyp was sessile with a mucus cap. The polyp was                            removed piecemeal with a hot and cold snare.                            Resection and retrieval were complete. (  Jar 5)                           Normal mucosa was found in the entire colon.                            Biopsies for histology were taken with a cold                            forceps from the right colon and left colon for                            evaluation of microscopic colitis. (Jar 4) Complications:            No immediate complications. Estimated Blood Loss:     Estimated blood loss was minimal. Impression:               - Decreased sphincter tone found on digital rectal                            exam.                           - Redundant colon.                           - One 8 mm polyp in the cecum, removed with a cold                            snare. Resected and retrieved.                           - One 3 mm polyp in the cecum, removed with a cold                            biopsy forceps. Resected and retrieved.                           - One 10 mm polyp in the cecum, removed using                            injection-lift and a hot snare. Resected and                            retrieved. Clip (MR conditional) was placed.                           - One 12 mm polyp in the ascending colon, removed                            with a hot snare. Resected and retrieved.                           - Normal mucosa in the entire examined colon.  Biopsied. Recommendation:           - Patient has a contact number available for                            emergencies. The signs and symptoms of potential                            delayed complications were discussed with the                            patient.  Return to normal activities tomorrow.                            Written discharge instructions were provided to the                            patient.                           - Resume previous diet.                           - Continue present medications.                           - Await pathology results.                           - Repeat colonoscopy is recommended for                            surveillance. The colonoscopy date will be                            determined after pathology results from today's                            exam become available for review. Ranjit Ashurst L. Loletha Carrow, MD 07/02/2018 3:53:32 PM This report has been signed electronically.

## 2018-07-03 ENCOUNTER — Telehealth: Payer: Self-pay

## 2018-07-03 NOTE — Telephone Encounter (Signed)
  Follow up Call-  Call back number 07/02/2018  Post procedure Call Back phone  # [phone]  Permission to leave phone message Yes  Some recent data might be hidden     Patient questions:  Do you have a fever, pain , or abdominal swelling? No. Pain Score  0 *  Have you tolerated food without any problems? Yes.    Have you been able to return to your normal activities? Yes.    Do you have any questions about your discharge instructions: Diet   No. Medications  No. Follow up visit  No.  Do you have questions or concerns about your Care? Yes.   Pt. Reports she was surprised to have diarrhea yesterday after arriving at home.  Pt. Wondered if polyps cause mucous to be in the stool.  Discussed with pt. That since she is feeling alright, and returned to her diet and activities post procedures, we will wait on the lab results for further indication for need of any type of treatment/diagnosis, and that polyps do not cause mucous in the stool.  Told pt. To call if she has any further questions or concerns. Actions: * If pain score is 4 or above: No action needed, pain <4.

## 2018-07-03 NOTE — Progress Notes (Signed)
Reviewed report/notes and updated pt's chart/history/PL and/or HM accordingly. EGD: normal Colonoscopy: decreased anal sphincter tone; 4 polyps; normal colonic mucosa

## 2018-07-06 ENCOUNTER — Other Ambulatory Visit: Payer: Self-pay

## 2018-07-06 ENCOUNTER — Ambulatory Visit (INDEPENDENT_AMBULATORY_CARE_PROVIDER_SITE_OTHER): Payer: Medicare Other | Admitting: Family Medicine

## 2018-07-06 ENCOUNTER — Encounter: Payer: Self-pay | Admitting: Family Medicine

## 2018-07-06 ENCOUNTER — Ambulatory Visit: Payer: Medicare Other | Admitting: Family Medicine

## 2018-07-06 VITALS — BP 142/90 | HR 84 | Temp 97.8°F | Ht 66.0 in | Wt 200.6 lb

## 2018-07-06 DIAGNOSIS — E7849 Other hyperlipidemia: Secondary | ICD-10-CM | POA: Diagnosis not present

## 2018-07-06 DIAGNOSIS — M19071 Primary osteoarthritis, right ankle and foot: Secondary | ICD-10-CM | POA: Diagnosis not present

## 2018-07-06 DIAGNOSIS — Z23 Encounter for immunization: Secondary | ICD-10-CM | POA: Diagnosis not present

## 2018-07-06 DIAGNOSIS — R002 Palpitations: Secondary | ICD-10-CM | POA: Diagnosis not present

## 2018-07-06 DIAGNOSIS — Z0289 Encounter for other administrative examinations: Secondary | ICD-10-CM

## 2018-07-06 DIAGNOSIS — K219 Gastro-esophageal reflux disease without esophagitis: Secondary | ICD-10-CM

## 2018-07-06 DIAGNOSIS — G609 Hereditary and idiopathic neuropathy, unspecified: Secondary | ICD-10-CM | POA: Diagnosis not present

## 2018-07-06 DIAGNOSIS — G2581 Restless legs syndrome: Secondary | ICD-10-CM | POA: Diagnosis not present

## 2018-07-06 MED ORDER — ATORVASTATIN CALCIUM 10 MG PO TABS: 10.0000 mg | ORAL_TABLET | ORAL | 3 refills | Status: DC

## 2018-07-06 MED ORDER — AMITRIPTYLINE HCL 25 MG PO TABS: 25.0000 mg | ORAL_TABLET | Freq: Every day | ORAL | 2 refills | Status: DC

## 2018-07-06 MED ORDER — ROPINIROLE HCL 4 MG PO TABS: 4.0000 mg | ORAL_TABLET | Freq: Every day | ORAL | 3 refills | Status: DC

## 2018-07-06 NOTE — Progress Notes (Signed)
Subjective  CC:  Chief Complaint  Patient presents with  . Follow-up    GERD, she states she has upper and lower GI series done last week, awaiting biopsy results    HPI: Tara Murphy is a 72 y.o. female who presents to the office today to address the problems listed above in the chief complaint.  Patient here for follow-up of hyperlipidemia, GERD, diarrhea, restless leg syndrome, peripheral neuropathy.  Status post upper endoscopy and colonoscopy.  I reviewed reports.  Has multiple polyps awaiting path.  Normal colonic mucosa.  Possible microscopic colitis or IBS as cause of diarrhea postulated.  EGD was normal so can continue PPIs chronically.  GERD symptoms are well controlled.  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  Peripheral neuropathy and restless leg syndrome continue to be a problem.  On ropinirole with some improvement.  Takes gabapentin as well.  Foot arthritis, severe: Nightly pain also interferes with sleep.  Uses tramadol nightly with significant improvement in pain.  He has been orthopedics and podiatry.  Hyperlipidemia with intolerance to statins due to myalgias but tolerating Lipitor weekly.  She will try to increase to 2-3 times per week.  No further palpitations. Assessment  1. Familial hyperlipidemia   2. Gastroesophageal reflux disease without esophagitis   3. Restless leg syndrome   4. Palpitations   5. Need for immunization against influenza   6. Neuropathy, peripheral, idiopathic   7. Primary osteoarthritis of right foot      Plan   Hyperlipidemia: Continue to try to increase statin to 2-3 times per week.  GERD is well controlled on chronic PPI.  Normal EGD.  Poor sleep due to restless leg syndrome, peripheral neuropathy pain and arthritis foot pain: Counseling done.  Recommend management strategies with possible glucosamine, turmeric, increasing ropinirole doses and add Elavil nightly.  Recheck 3 months.  Recommend nightly  meditation.  Annual wellness visit and CPE due in January  Follow up: Return in about 3 months (around 10/06/2018) for complete physical.   Orders Placed This Encounter  Procedures  . Flu Vaccine QUAD 36+ mos IM   Meds ordered this encounter  Medications  . rOPINIRole (REQUIP) 4 MG tablet    Sig: Take 1 tablet (4 mg total) by mouth at bedtime for 7 days.    Dispense:  90 tablet    Refill:  3    Dose change  . atorvastatin (LIPITOR) 10 MG tablet    Sig: Take 1 tablet (10 mg total) by mouth 3 (three) times a week.    Dispense:  90 tablet    Refill:  3  . amitriptyline (ELAVIL) 25 MG tablet    Sig: Take 1 tablet (25 mg total) by mouth at bedtime.    Dispense:  30 tablet    Refill:  2      I reviewed the patients updated PMH, FH, and SocHx.    Patient Active Problem List   Diagnosis Date Noted  . Familial hyperlipidemia 11/20/2017    Priority: High  . History of left breast cancer 03/20/2015    Priority: High  . GERD (gastroesophageal reflux disease) 11/20/2017    Priority: Medium  . Restless leg syndrome 11/20/2017    Priority: Medium  . OA (osteoarthritis) of knee 12/18/2015    Priority: Medium  . OA (osteoarthritis) of hip 12/18/2015    Priority: Medium  . Osteoarthritis of spine with radiculopathy, 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    Priority: Medium  . History of  Clostridium difficile colitis 06/16/2015    Priority: Medium  . 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 Neuropathy, peripheral, idiopathic 06/13/2015    Priority: Medium  . 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 Essential tremor 09/26/2016    Priority: Low  . Chronic allergic rhinitis 06/13/2015    Priority: Low  . Recurrent urinary tract infection 06/13/2015    Priority: Low  . Hammer toe 04/13/2018  . Osteoarthritis of right foot 04/13/2018   Current Meds  Medication Sig  . atorvastatin (LIPITOR) 10 MG tablet Take 1  tablet (10 mg total) by mouth 3 (three) times a week.  . clobetasol (TEMOVATE) 0.05 % external solution Apply 1 application topically 2 (two) times daily as needed (for scalp inflammation).   Marland Kitchen dicyclomine (BENTYL) 10 MG capsule TAKE 1 CAPSULE BY MOUTH UP TO 3 TIMES DAILY AS NEEDED FOR SPASMS  . fluticasone (FLONASE) 50 MCG/ACT nasal spray Place 1 spray into the nose daily as needed for allergies or rhinitis.   Marland Kitchen gabapentin (NEURONTIN) 300 MG capsule Take 2 capsules (600 mg total) by mouth 2 (two) times daily.  Marland Kitchen rOPINIRole (REQUIP) 4 MG tablet Take 1 tablet (4 mg total) by mouth at bedtime for 7 days.  . traMADol (ULTRAM) 50 MG tablet Take 1 tablet (50 mg total) by mouth daily as needed.  . [DISCONTINUED] atorvastatin (LIPITOR) 10 MG tablet Take 1 tablet (10 mg total) by mouth daily.  . [DISCONTINUED] rOPINIRole (REQUIP) 2 MG tablet Take 1 tablet (2 mg total) by mouth at bedtime for 7 days.    Allergies: Patient is allergic to contrast media [iodinated diagnostic agents]; hydromorphone hcl; other; sulfamethoxazole-trimethoprim; fentanyl; tape; and terbinafine and related. Family History: Patient family history includes Arthritis in her father and mother; Breast cancer (age of onset: 71) in her sister; Crohn's disease in her mother; Diabetes in her mother; Hyperlipidemia in her maternal aunt; Lung cancer in her father; Stomach cancer in her maternal grandmother. Social History:  Patient  reports that she quit smoking about 26 years ago. Her smoking use included cigarettes. She has never used smokeless tobacco. She reports that she drinks about 4.0 standard drinks of alcohol per week. She reports that she does not use drugs.  Review of Systems: Constitutional: Negative for fever malaise or anorexia Cardiovascular: negative for chest pain Respiratory: negative for SOB or persistent cough Gastrointestinal: negative for abdominal pain  Objective  Vitals: BP (!) 142/90   Pulse 84   Temp 97.8 F  (36.6 C)   Ht 5\' 6"  (1.676 m)   Wt 200 lb 9.6 oz (91 kg)   SpO2 98%   BMI 32.38 kg/m  General: no acute distress , A&Ox3   Commons side effects, risks, benefits, and alternatives for medications and treatment plan prescribed today were discussed, and the patient expressed understanding of the given instructions. Patient is instructed to call or message via MyChart if he/she has any questions or concerns regarding our treatment plan. No barriers to understanding were identified. We discussed Red Flag symptoms and signs in detail. Patient expressed understanding regarding what to do in case of urgent or emergency type symptoms.   Medication list was reconciled, printed and provided to the patient in AVS. Patient instructions and summary information was reviewed with the patient as documented in the AVS. This note was prepared with assistance of Dragon voice recognition software. Occasional wrong-word or sound-a-like substitutions may  have occurred due to the inherent limitations of voice recognition software

## 2018-07-06 NOTE — Patient Instructions (Addendum)
Please return in January for your annual complete physical; please come fasting. And AWV in January as well.   If you have any questions or concerns, please don't hesitate to send me a message via MyChart or call the office at [phone]. Thank you for visiting with [name] today! It's our pleasure caring for you.  Increase your requip dose to 4mg  nightly for the Restless legs syndrome.

## 2018-07-09 ENCOUNTER — Encounter: Payer: Self-pay | Admitting: Family Medicine

## 2018-07-09 DIAGNOSIS — N390 Urinary tract infection, site not specified: Secondary | ICD-10-CM

## 2018-07-12 MED ORDER — CIPROFLOXACIN HCL 500 MG PO TABS: 500.0000 mg | ORAL_TABLET | Freq: Two times a day (BID) | ORAL | 0 refills | Status: DC

## 2018-07-14 ENCOUNTER — Encounter: Payer: Self-pay | Admitting: Gastroenterology

## 2018-07-17 ENCOUNTER — Other Ambulatory Visit: Payer: Self-pay | Admitting: Gastroenterology

## 2018-08-03 ENCOUNTER — Other Ambulatory Visit: Payer: Self-pay | Admitting: Gastroenterology

## 2018-08-17 ENCOUNTER — Telehealth: Payer: Self-pay | Admitting: Family Medicine

## 2018-08-17 MED ORDER — GABAPENTIN 300 MG PO CAPS: 600.0000 mg | ORAL_CAPSULE | Freq: Two times a day (BID) | ORAL | 0 refills | Status: DC

## 2018-08-17 NOTE — Telephone Encounter (Signed)
Prescription sent to pharmacy    Doloris Hall,  LPN

## 2018-08-17 NOTE — Telephone Encounter (Signed)
Copied from [place] [id number]. Topic: Quick Communication - Rx Refill/Question >> Aug 17, 2018  1:13 PM Scherrie Gerlach wrote: Medication: gabapentin (NEURONTIN) 300 MG capsule (takes 600 mg a day)  Has the patient contacted their pharmacy? yes pt has not gotten dr Jonni Sanger to write this since she switched to this office. Needs new Rx sent to   [facility] [address], [place] [phone] (Phone) [phone] (Fax) 90 day

## 2018-09-29 ENCOUNTER — Other Ambulatory Visit: Payer: Self-pay | Admitting: Family Medicine

## 2018-09-29 MED ORDER — ROPINIROLE HCL 4 MG PO TABS: 4.0000 mg | ORAL_TABLET | Freq: Every day | ORAL | 0 refills | Status: DC

## 2018-09-29 NOTE — Telephone Encounter (Signed)
Copied from [place] [id number]. Topic: Quick Communication - Rx Refill/Question >> Sep 29, 2018  3:26 PM Yvette Rack wrote: Medication: rOPINIRole (REQUIP) 4 MG tablet (Expired) pt ran out early because was told to up dose she had 3 days left of medicine   Has the patient contacted their pharmacy? Yes. today  (Agent: If no, request that the patient contact the pharmacy for the refill.) (Agent: If yes, when and what did the pharmacy advise?)call pharmacy today to call provider because pt ran out early because was told to up dose she had 3 days left of medicine   Preferred Pharmacy (with phone number or street name):     [facility] [address], [place] - [place] [phone] (Phone) [phone] (Fax)    Agent: Please be advised that RX refills may take up to 3 business days. We ask that you follow-up with your pharmacy.

## 2018-09-29 NOTE — Telephone Encounter (Signed)
Pt need refill on the traMADol (ULTRAM) 50 MG tablet also

## 2018-09-29 NOTE — Telephone Encounter (Signed)
Requested medication (s) are due for refill today -yes  Requested medication (s) are on the active medication list -yes  Future visit scheduled -yes  Last refill: 08/17/18  Notes to clinic: Please review Rx- Sig needs to be corrected to every night per last visit. Ultram has been pended in another request for provider review.  Requested Prescriptions  Pending Prescriptions Disp Refills   rOPINIRole (REQUIP) 4 MG tablet 90 tablet 3    Sig: Take 1 tablet (4 mg total) by mouth at bedtime for 7 days.     Neurology:  Parkinsonian Agents Failed - 09/29/2018  3:36 PM      Failed - Last BP in normal range    BP Readings from Last 1 Encounters:  07/06/18 (!) 142/90         Passed - Valid encounter within last 12 months    Recent Outpatient Visits          2 months ago Familial hyperlipidemia   [facility] [place], MD   4 months ago [facility] [place], MD   5 months ago Diarrhea, unspecified type   [facility] [place], MD   10 months ago Familial hyperlipidemia   [facility] [employer], MD      Future Appointments            In 1 week Leamon Arnt, MD [facility] [place], [facility]            Requested Prescriptions  Pending Prescriptions Disp Refills   rOPINIRole (REQUIP) 4 MG tablet 90 tablet 3    Sig: Take 1 tablet (4 mg total) by mouth at bedtime for 7 days.     Neurology:  Parkinsonian Agents Failed - 09/29/2018  3:36 PM      Failed - Last BP in normal range    BP Readings from Last 1 Encounters:  07/06/18 (!) 142/90         Passed - Valid encounter within last 12 months    Recent Outpatient Visits          2 months ago Familial hyperlipidemia   [facility] [place], MD   4 months ago [facility] [place], MD   5 months ago Diarrhea, unspecified type   [facility] [place], MD   10 months ago Familial hyperlipidemia   [facility] [place], MD      Future Appointments            In 1 week Leamon Arnt, MD [facility] [place], [facility]

## 2018-09-29 NOTE — Telephone Encounter (Signed)
Last refill: 04/06/18 #30, 3  Last OV: 07/06/18

## 2018-09-29 NOTE — Telephone Encounter (Signed)
Pt was told to take medicine once at night of the Requip 4mg  she just would like the dosage to be right on RX

## 2018-09-30 ENCOUNTER — Other Ambulatory Visit: Payer: Self-pay | Admitting: Family Medicine

## 2018-10-08 ENCOUNTER — Encounter: Payer: Self-pay | Admitting: Family Medicine

## 2018-10-08 ENCOUNTER — Ambulatory Visit (INDEPENDENT_AMBULATORY_CARE_PROVIDER_SITE_OTHER): Payer: Medicare Other | Admitting: Family Medicine

## 2018-10-08 ENCOUNTER — Other Ambulatory Visit: Payer: Self-pay

## 2018-10-08 VITALS — BP 130/62 | HR 91 | Temp 97.8°F | Resp 16 | Ht 66.0 in | Wt 206.0 lb

## 2018-10-08 DIAGNOSIS — E7849 Other hyperlipidemia: Secondary | ICD-10-CM | POA: Diagnosis not present

## 2018-10-08 DIAGNOSIS — Z853 Personal history of malignant neoplasm of breast: Secondary | ICD-10-CM

## 2018-10-08 DIAGNOSIS — E2839 Other primary ovarian failure: Secondary | ICD-10-CM

## 2018-10-08 DIAGNOSIS — G609 Hereditary and idiopathic neuropathy, unspecified: Secondary | ICD-10-CM | POA: Diagnosis not present

## 2018-10-08 DIAGNOSIS — G250 Essential tremor: Secondary | ICD-10-CM

## 2018-10-08 DIAGNOSIS — K580 Irritable bowel syndrome with diarrhea: Secondary | ICD-10-CM

## 2018-10-08 DIAGNOSIS — D126 Benign neoplasm of colon, unspecified: Secondary | ICD-10-CM | POA: Insufficient documentation

## 2018-10-08 DIAGNOSIS — K589 Irritable bowel syndrome without diarrhea: Secondary | ICD-10-CM

## 2018-10-08 DIAGNOSIS — G2581 Restless legs syndrome: Secondary | ICD-10-CM | POA: Diagnosis not present

## 2018-10-08 HISTORY — DX: Irritable bowel syndrome, unspecified: K58.9

## 2018-10-08 HISTORY — DX: Benign neoplasm of colon, unspecified: D12.6

## 2018-10-08 LAB — CBC WITH DIFFERENTIAL/PLATELET
BASOS ABS: 0 10*3/uL (ref 0.0–0.1)
Basophils Relative: 0.6 % (ref 0.0–3.0)
Eosinophils Absolute: 0.1 10*3/uL (ref 0.0–0.7)
Eosinophils Relative: 1 % (ref 0.0–5.0)
HCT: 41.3 % (ref 36.0–46.0)
Hemoglobin: 13.9 g/dL (ref 12.0–15.0)
Lymphocytes Relative: 26.1 % (ref 12.0–46.0)
Lymphs Abs: 1.7 10*3/uL (ref 0.7–4.0)
MCHC: 33.7 g/dL (ref 30.0–36.0)
MCV: 91.4 fl (ref 78.0–100.0)
Monocytes Absolute: 0.4 10*3/uL (ref 0.1–1.0)
Monocytes Relative: 6.1 % (ref 3.0–12.0)
Neutro Abs: 4.3 10*3/uL (ref 1.4–7.7)
Neutrophils Relative %: 66.2 % (ref 43.0–77.0)
Platelets: 386 10*3/uL (ref 150.0–400.0)
RBC: 4.52 Mil/uL (ref 3.87–5.11)
RDW: 12.9 % (ref 11.5–15.5)
WBC: 6.5 10*3/uL (ref 4.0–10.5)

## 2018-10-08 LAB — COMPREHENSIVE METABOLIC PANEL
ALT: 21 U/L (ref 0–35)
AST: 16 U/L (ref 0–37)
Albumin: 4.4 g/dL (ref 3.5–5.2)
Alkaline Phosphatase: 106 U/L (ref 39–117)
BUN: 13 mg/dL (ref 6–23)
CO2: 27 mEq/L (ref 19–32)
Calcium: 9.6 mg/dL (ref 8.4–10.5)
Chloride: 106 mEq/L (ref 96–112)
Creatinine, Ser: 0.84 mg/dL (ref 0.40–1.20)
GFR: 66.7 mL/min (ref >60.00)
Glucose, Bld: 105 mg/dL — ABNORMAL HIGH (ref 70–99)
Potassium: 4.5 mEq/L (ref 3.5–5.1)
Sodium: 140 mEq/L (ref 135–145)
Total Bilirubin: 0.3 mg/dL (ref 0.2–1.2)
Total Protein: 6.7 g/dL (ref 6.0–8.3)

## 2018-10-08 LAB — LIPID PANEL
Cholesterol: 280 mg/dL — ABNORMAL HIGH (ref 0–200)
HDL: 65.5 mg/dL (ref >39.00)
LDL Cholesterol: 180 mg/dL — ABNORMAL HIGH (ref 0–99)
NonHDL: 214.78
Total CHOL/HDL Ratio: 4
Triglycerides: 176 mg/dL — ABNORMAL HIGH (ref 0.0–149.0)
VLDL: 35.2 mg/dL (ref 0.0–40.0)

## 2018-10-08 MED ORDER — ATORVASTATIN CALCIUM 10 MG PO TABS: 10.0000 mg | ORAL_TABLET | ORAL | 3 refills | Status: DC

## 2018-10-08 MED ORDER — ZOSTER VAC RECOMB ADJUVANTED 50 MCG/0.5ML IM SUSR: 0.5000 mL | Freq: Once | INTRAMUSCULAR | 0 refills | Status: AC

## 2018-10-08 MED ORDER — GABAPENTIN 300 MG PO CAPS: 600.0000 mg | ORAL_CAPSULE | Freq: Two times a day (BID) | ORAL | 3 refills | Status: DC

## 2018-10-08 MED ORDER — ROPINIROLE HCL 4 MG PO TABS: 4.0000 mg | ORAL_TABLET | Freq: Every day | ORAL | 3 refills | Status: DC

## 2018-10-08 NOTE — Patient Instructions (Addendum)
Please return in 6 months to echeck neuropathy and RLS. Can schedule AWV now as well.   Try taking the crestor 1/2 tab 2x/week with CoEnzyme Q 10. Its over the counter.  If you have any questions or concerns, please don't hesitate to send me a message via MyChart or call the office at [phone]. Thank you for visiting with [name] today! It's our pleasure caring for you.  Please do these things to maintain good health!   Exercise at least 30-45 minutes a day,  4-5 days a week.   Eat a low-fat diet with lots of fruits and vegetables, up to 7-9 servings per day.  Drink plenty of water daily. Try to drink 8 8oz glasses per day.  Seatbelts can save your life. Always wear your seatbelt.  Place Smoke Detectors on every level of your home and check batteries every year.  Schedule an appointment with an eye doctor for an eye exam every 1-2 years  Safe sex - use condoms to protect yourself from STDs if you could be exposed to these types of infections. Use birth control if you do not want to become pregnant and are sexually active.  Avoid heavy alcohol use. If you drink, keep it to less than 2 drinks/day and not every day.  [place].  Choose someone you trust that could speak for you if you became unable to speak for yourself.  Depression is common in our stressful world.If you're feeling down or losing interest in things you normally enjoy, please come in for a visit.  If anyone is threatening or hurting you, please get help. Physical or Emotional Violence is never OK.

## 2018-10-08 NOTE — Progress Notes (Signed)
Subjective  Chief Complaint  Patient presents with  . Annual Exam    Non fasting today  . Hypertension    Home BP readings 135-147/69-70  headaches    HPI: Tara Murphy is a 73 y.o. female who presents to [facility] today for a Female Wellness Visit.   Wellness Visit: annual visit with health maintenance review and exam without Pap, medicare   Hyperlipidemia, familial: Was not able to tolerate the Crestor twice a day.  Has myalgias.  Has never tried coenzyme Q 10.  Neuropathy and restless leg syndrome are well controlled as long as she is taking her medications.  We added Elavil last visit and this has augmented her symptom control.  She continues on Requip at the higher dose 4 mg nightly.  She takes gabapentin high-dose for the neuropathy.  Rarely needing tramadol at this point for pain.  Chronic diarrhea: Follow-up with GI revealed a normal EGD and sessile polyps on colonoscopy.  Has history of C. difficile but now diagnosed with IBS with diarrhea.  Managing supportively.  Using Levsin.  Needs refill.  Health maintenance: Due for bone density screening in August with her mammogram.  Assessment  1. Familial hyperlipidemia   2. Neuropathy, peripheral, idiopathic   3. Restless leg syndrome   4. Essential tremor   5. Irritable bowel syndrome with diarrhea   6. Hypoestrogenism   7. Tubulovillous adenoma of colon   8. History of left breast cancer      Plan  Female Wellness Visit:  Age appropriate Health Maintenance and Prevention measures were discussed with patient. Included topics are cancer screening recommendations, ways to keep healthy (see AVS) including dietary and exercise recommendations, regular eye and dental care, use of seat belts, and avoidance of moderate alcohol use and tobacco use.  Mammogram and bone density scheduled for August.  It will be 5 years since her cancer diagnosis later this year.  BMI: discussed patient's BMI and  encouraged positive lifestyle modifications to help get to or maintain a target BMI.  HM needs and immunizations were addressed and ordered. See below for orders. See HM and immunization section for updates.  Routine labs and screening tests ordered including cmp, cbc and lipids where appropriate.  Discussed recommendations regarding Vit D and calcium supplementation (see AVS)  Hyperlipidemia: Trial of co-Q10 with statin twice weekly recommended.  Check lab work.  Neuropathy and restless leg syndrome are well controlled.  Continue current medications  IBS managed supportively.  Colon polyp, adenoma: Needs colonoscopy for recheck next year.  Patient is aware  Follow up: Return in about 6 months (around 04/08/2019) for recheck.   Orders Placed This Encounter  Procedures  . DG Bone Density  . CBC with Differential/Platelet  . Comprehensive metabolic panel  . Lipid panel  . TSH   Meds ordered this encounter  Medications  . rOPINIRole (REQUIP) 4 MG tablet    Sig: Take 1 tablet (4 mg total) by mouth at bedtime.    Dispense:  90 tablet    Refill:  3  . Zoster Vaccine Adjuvanted [facility]) injection    Sig: Inject 0.5 mLs into the muscle once for 1 dose. Please give 2nd dose 2-6 months after first dose    Dispense:  2 each    Refill:  0  . gabapentin (NEURONTIN) 300 MG capsule    Sig: Take 2 capsules (600 mg total) by mouth 2 (two) times daily.    Dispense:  360 capsule  Refill:  3  . atorvastatin (LIPITOR) 10 MG tablet    Sig: Take 1 tablet (10 mg total) by mouth 3 (three) times a week.    Dispense:  90 tablet    Refill:  3     Lifestyle: Body mass index is 33.25 kg/m. Wt Readings from Last 3 Encounters:  10/08/18 206 lb (93.4 kg)  07/06/18 200 lb 9.6 oz (91 kg)  07/02/18 200 lb (90.7 kg)     Patient Active Problem List   Diagnosis Date Noted  . 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    Priority: High    Colonoscopy 06/2018; serrated sessile polyp as well.  Repeat 2020   . Familial hyperlipidemia 11/20/2017    Priority: High  . History of left breast cancer 03/20/2015    Priority: High    Overview:  Last Assessment & Plan:  Left breast invasive ductal carcinoma diagnosed and treated in [place] diagnosed October 2014 status post lumpectomy January 2015 1.1 cm IDC grade 1 T1 cN0 M0 stage IA, 0/2 lymph nodes, ER/PR positive HER-2 negative status post MammoSite radiation started anastrozole 1 mg daily March 2015  Anastrozole toxicities: 1. Severe emotional problems requiring Zoloft, Ambien, clonazepam which she attributes partially to the medication as well as stool her mother passing away last year along with moving to [place]. She is quite or rebound. 2. Skin dryness 3. Lack of feeling of wellness  Plan: 1. Treatment break from anastrozole for 3 months 2. When she returns if her symptoms haven't gotten better than we can associate those for anastrozole and we can plan on changing therapy to another aromatase inhibitor versus using tamoxifen.  Return to clinic in 3 months to assess benefits of treatment break and follow-up.   . IBS (irritable bowel syndrome) 10/08/2018    Priority: Medium    Colonoscopy and EGD 09/2018   . GERD (gastroesophageal reflux disease) 11/20/2017    Priority: Medium  . Restless leg syndrome 11/20/2017    Priority: Medium  . OA (osteoarthritis) of knee 12/18/2015    Priority: Medium  . OA (osteoarthritis) of hip 12/18/2015    Priority: Medium  . Osteoarthritis of spine with radiculopathy, 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    Priority: Medium  . History of Clostridium difficile colitis 06/16/2015    Priority: Medium  . 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 Neuropathy, peripheral, idiopathic 06/13/2015    Priority: Medium    Overview:  Uses gabapentin   . Lichen plano-pilaris  11/20/2017    Priority: Low  . Essential tremor 09/26/2016    Priority: Low  . Chronic allergic rhinitis 06/13/2015    Priority: Low  . Recurrent urinary tract infection 06/13/2015    Priority: Low  . Hammer toe 04/13/2018  . Osteoarthritis of right foot 04/13/2018   Health Maintenance  Topic Date Due  . DEXA SCAN  05/20/2019 (Originally 07/07/2015)  . MAMMOGRAM  05/14/2019  . COLONOSCOPY  07/03/2019  . TETANUS/TDAP  06/23/2022  . INFLUENZA VACCINE  Completed  . Hepatitis C Screening  Completed  . PNA vac Low Risk Adult  Completed   Immunization History  Administered Date(s) Administered  . Influenza, High Dose Seasonal PF 06/13/2015, 09/26/2016, 10/02/2017  . Influenza,inj,Quad PF,6+ Mos 10/02/2017, 07/06/2018  . Influenza,inj,quad, With Preservative 06/23/2017  . Pneumococcal Conjugate-13 08/15/2014  . Pneumococcal Polysaccharide-23 06/16/2013  . Tdap 06/23/2012  .  Zoster 01/25/2011   We updated and reviewed the patient's past history in detail and it is documented below. Allergies: Patient is allergic to contrast media [iodinated diagnostic agents]; hydromorphone hcl; other; sulfamethoxazole-trimethoprim; fentanyl; tape; and terbinafine and related. Past Medical History Patient  has a past medical history of Allergy, Arthritis, 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[place]), Breast cancer ([place]) (2015), Cataract, Cholecystolithiasis, Chronic allergic rhinitis, Depression, GERD (gastroesophageal reflux disease), Hyperlipemia, IBS (irritable bowel syndrome) (4/69/6295), Lichen plano-pilaris, Major depression, chronic (09/05/2015), Neuropathy, peripheral, idiopathic, Personal history of radiation therapy (2015), Tubulovillous adenoma of colon (10/08/2018), and Urinary tract infection. Past Surgical History Patient  has a past surgical history that includes Appendectomy; Total hip arthroplasty; Total knee arthroplasty; Tubal ligation; cataracts; Liposuction; Total hip arthroplasty (Right,  12/18/2015); Cholecystectomy (N/A, 10/29/2016); Total knee arthroplasty (Left, 11/25/2016); and Breast lumpectomy (Left, 2015). Family History: Patient family history includes Arthritis in her father and mother; Breast cancer (age of onset: 21) in her sister; Crohn's disease in her mother; Diabetes in her mother; Hyperlipidemia in her maternal aunt; Lung cancer in her father; Stomach cancer in her maternal grandmother. Social History:  Patient  reports that she quit smoking about 26 years ago. Her smoking use included cigarettes. She has never used smokeless tobacco. She reports current alcohol use of about 4.0 standard drinks of alcohol per week. She reports that she does not use drugs.  Review of Systems: Constitutional: negative for fever or malaise Ophthalmic: negative for photophobia, double vision or loss of vision Cardiovascular: negative for chest pain, dyspnea on exertion, or new LE swelling Respiratory: negative for SOB or persistent cough Gastrointestinal: negative for abdominal pain, change in bowel habits or melena Genitourinary: negative for dysuria or gross hematuria, no abnormal uterine bleeding or disharge Musculoskeletal: negative for new gait disturbance or muscular weakness Integumentary: negative for new or persistent rashes, no breast lumps Neurological: negative for TIA or stroke symptoms Psychiatric: negative for SI or delusions Allergic/Immunologic: negative for hives  Patient Care Team    Relationship Specialty Notifications Start End  Leamon Arnt, MD PCP - General Family Medicine  01/23/18     Objective  Vitals: BP 130/62   Pulse 91   Temp 97.8 F (36.6 C) (Oral)   Resp 16   Ht 5' 6"  (1.676 m)   Wt 206 lb (93.4 kg)   SpO2 98%   BMI 33.25 kg/m  General:  Well developed, well nourished, no acute distress  Psych:  Alert and orientedx3,normal mood and affect HEENT:  Normocephalic, atraumatic, non-icteric sclera, PERRL, oropharynx is clear without mass or  exudate, supple neck without adenopathy, mass or thyromegaly Cardiovascular:  Normal S1, S2, RRR without gallop, rub or murmur, nondisplaced PMI Respiratory:  Good breath sounds bilaterally, CTAB with normal respiratory effort Gastrointestinal: normal bowel sounds, soft, non-tender, no noted masses. No HSM MSK: no deformities, contusions. Joints are without erythema or swelling. Spine and CVA region are nontender Skin:  Warm, no rashes or suspicious lesions noted Neurologic:    Mental status is normal. CN 2-11 are normal. Gross motor and sensory exams are normal. Normal gait. No tremor Breast Exam: No mass, skin retraction or nipple discharge is appreciated in either breast. No axillary adenopathy    Commons side effects, risks, benefits, and alternatives for medications and treatment plan prescribed today were discussed, and the patient expressed understanding of the given instructions. Patient is instructed to call or message via MyChart if he/she has any questions or concerns regarding our treatment plan. No barriers  to understanding were identified. We discussed Red Flag symptoms and signs in detail. Patient expressed understanding regarding what to do in case of urgent or emergency type symptoms.   Medication list was reconciled, printed and provided to the patient in AVS. Patient instructions and summary information was reviewed with the patient as documented in the AVS. This note was prepared with assistance of Dragon voice recognition software. Occasional wrong-word or sound-a-like substitutions may have occurred due to the inherent limitations of voice recognition software

## 2018-10-09 LAB — TSH: TSH: 3.54 u[IU]/mL (ref 0.35–4.50)

## 2018-10-27 ENCOUNTER — Other Ambulatory Visit: Payer: Self-pay | Admitting: Family Medicine

## 2018-11-10 DIAGNOSIS — B9689 Other specified bacterial agents as the cause of diseases classified elsewhere: Secondary | ICD-10-CM | POA: Diagnosis not present

## 2018-11-10 DIAGNOSIS — J019 Acute sinusitis, unspecified: Secondary | ICD-10-CM | POA: Diagnosis not present

## 2018-11-26 ENCOUNTER — Other Ambulatory Visit: Payer: Self-pay | Admitting: Family Medicine

## 2018-12-27 ENCOUNTER — Other Ambulatory Visit: Payer: Self-pay | Admitting: Family Medicine

## 2019-04-15 DIAGNOSIS — D485 Neoplasm of uncertain behavior of skin: Secondary | ICD-10-CM | POA: Diagnosis not present

## 2019-04-15 DIAGNOSIS — L300 Nummular dermatitis: Secondary | ICD-10-CM | POA: Diagnosis not present

## 2019-04-15 DIAGNOSIS — L814 Other melanin hyperpigmentation: Secondary | ICD-10-CM | POA: Diagnosis not present

## 2019-04-15 DIAGNOSIS — L820 Inflamed seborrheic keratosis: Secondary | ICD-10-CM | POA: Diagnosis not present

## 2019-04-15 DIAGNOSIS — L821 Other seborrheic keratosis: Secondary | ICD-10-CM | POA: Diagnosis not present

## 2019-04-15 DIAGNOSIS — L718 Other rosacea: Secondary | ICD-10-CM | POA: Diagnosis not present

## 2019-05-13 ENCOUNTER — Encounter: Payer: Self-pay | Admitting: Family Medicine

## 2019-05-13 ENCOUNTER — Ambulatory Visit (INDEPENDENT_AMBULATORY_CARE_PROVIDER_SITE_OTHER): Payer: Medicare Other | Admitting: Family Medicine

## 2019-05-13 ENCOUNTER — Other Ambulatory Visit: Payer: Self-pay

## 2019-05-13 VITALS — BP 142/88 | HR 77 | Temp 97.9°F | Resp 16 | Ht 66.0 in | Wt 213.2 lb

## 2019-05-13 DIAGNOSIS — Z853 Personal history of malignant neoplasm of breast: Secondary | ICD-10-CM | POA: Diagnosis not present

## 2019-05-13 DIAGNOSIS — R399 Unspecified symptoms and signs involving the genitourinary system: Secondary | ICD-10-CM

## 2019-05-13 DIAGNOSIS — N3000 Acute cystitis without hematuria: Secondary | ICD-10-CM | POA: Diagnosis not present

## 2019-05-13 DIAGNOSIS — E2839 Other primary ovarian failure: Secondary | ICD-10-CM | POA: Diagnosis not present

## 2019-05-13 DIAGNOSIS — Z1239 Encounter for other screening for malignant neoplasm of breast: Secondary | ICD-10-CM

## 2019-05-13 DIAGNOSIS — Z23 Encounter for immunization: Secondary | ICD-10-CM

## 2019-05-13 DIAGNOSIS — D126 Benign neoplasm of colon, unspecified: Secondary | ICD-10-CM

## 2019-05-13 LAB — POCT URINALYSIS DIPSTICK
Bilirubin, UA: NEGATIVE
Blood, UA: NEGATIVE
Glucose, UA: NEGATIVE
Ketones, UA: NEGATIVE
Nitrite, UA: NEGATIVE
Protein, UA: NEGATIVE
Spec Grav, UA: 1.025 (ref 1.010–1.025)
Urobilinogen, UA: 0.2 E.U./dL
pH, UA: 6 (ref 5.0–8.0)

## 2019-05-13 MED ORDER — CIPROFLOXACIN HCL 500 MG PO TABS: 500.0000 mg | ORAL_TABLET | Freq: Two times a day (BID) | ORAL | 0 refills | Status: AC

## 2019-05-13 MED ORDER — TRAMADOL HCL 50 MG PO TABS: 50.0000 mg | ORAL_TABLET | Freq: Every day | ORAL | 2 refills | Status: DC | PRN

## 2019-05-13 NOTE — Progress Notes (Signed)
Subjective   CC:  Chief Complaint  Patient presents with  . Urinary Tract Infection    Symptoms started 8/16, dysuria, pressure, foul smell, and frequency/urgency.. Has tried Azo 8/19 with minimal relief    HPI: Tara Murphy is a 73 y.o. female who presents to the office today to address the problems listed above in the chief complaint.  Patient reports dysuria and urinary frequency.  She has sensation of increased urinary pressure.  She denies fevers flank pain nausea vomiting or gross hematuria.  Symptoms have been present for several hours to days.  She denies history of interstitial cystitis.  She denies vaginal symptoms including vaginal discharge or pelvic pain.   History of breast cancer mammogram not due.  Clinical breast exam done in January.  Osteoporosis screening due.  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  Due for repeat colonoscopy this year.  Patient to schedule.  Chronic diarrhea persist.  Diagnosed with IBS but she is skeptical of this diagnosis.  Still having problems.  Not improved with probiotics.  She follows up with GI.  Health maintenance: Due for flu shot.  Annual wellness visit due.  Assessment  1. Acute cystitis without hematuria   2. UTI symptoms   3. Hypoestrogenism   4. Breast cancer screening   5. Tubulovillous adenoma of colon   6. History of left breast cancer      Plan   Acute cystitis: Cipro x5 days and await culture results.  Uncommon  .  Chronic diarrhea per GI  Recommend scheduling colonoscopy, patient to call GI  Ordered mammogram and bone density.   Follow up: Return in about 5 months (around 10/13/2019) for complete physical, AWV at patient's convenience.  Orders Placed This Encounter  Procedures  . Urine Culture  . DG Bone Density  . MM DIGITAL SCREENING BILATERAL  . POCT urinalysis dipstick   Meds ordered this encounter  Medications  . ciprofloxacin (CIPRO) 500 MG tablet    Sig: Take 1  tablet (500 mg total) by mouth 2 (two) times daily for 5 days.    Dispense:  10 tablet    Refill:  0      I reviewed the patients updated PMH, FH, and SocHx.    Patient Active Problem List   Diagnosis Date Noted  . 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    Priority: High  . Familial hyperlipidemia 11/20/2017    Priority: High  . History of left breast cancer 03/20/2015    Priority: High  . IBS (irritable bowel syndrome) 10/08/2018    Priority: Medium  . GERD (gastroesophageal reflux disease) 11/20/2017    Priority: Medium  . Restless leg syndrome 11/20/2017    Priority: Medium  . OA (osteoarthritis) of knee 12/18/2015    Priority: Medium  . OA (osteoarthritis) of hip 12/18/2015    Priority: Medium  . Osteoarthritis of spine with radiculopathy, 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    Priority: Medium  . History of Clostridium difficile colitis 06/16/2015    Priority: Medium  . 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 Neuropathy, peripheral, idiopathic 06/13/2015    Priority: Medium  . 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 Essential tremor 09/26/2016    Priority: Low  . Chronic allergic rhinitis 06/13/2015    Priority: Low  . Recurrent urinary tract infection 06/13/2015    Priority: Low  . Hammer toe 04/13/2018  .  Osteoarthritis of right foot 04/13/2018   Current Meds  Medication Sig  . amitriptyline (ELAVIL) 25 MG tablet TAKE ONE TABLET BY MOUTH AT BEDTIME  . atorvastatin (LIPITOR) 10 MG tablet TAKE ONE TABLET BY MOUTH DAILY  . clobetasol (TEMOVATE) 0.05 % external solution Apply 1 application topically 2 (two) times daily as needed (for scalp inflammation).   Marland Kitchen dicyclomine (BENTYL) 10 MG capsule TAKE 1 CAPSULE BY MOUTH UP TO 3 TIMES DAILY AS NEEDED FOR SPASMS  . fluticasone (FLONASE) 50 MCG/ACT nasal spray Place 1 spray into the nose daily as needed for allergies or rhinitis.   Marland Kitchen gabapentin  (NEURONTIN) 300 MG capsule Take 2 capsules (600 mg total) by mouth 2 (two) times daily.  Marland Kitchen rOPINIRole (REQUIP) 4 MG tablet Take 1 tablet (4 mg total) by mouth at bedtime.  . traMADol (ULTRAM) 50 MG tablet TAKE ONE TABLET BY MOUTH DAILY AS NEEDED    Review of Systems: Cardiovascular: negative for chest pain Respiratory: negative for SOB or persistent cough Gastrointestinal: negative for abdominal pain Constitutional: Negative for fever malaise or anorexia  Objective  Vitals: BP (!) 142/88   Pulse 77   Temp 97.9 F (36.6 C) (Tympanic)   Resp 16   Ht 5\' 6"  (1.676 m)   Wt 213 lb 3.2 oz (96.7 kg)   SpO2 93%   BMI 34.41 kg/m  General: no acute distress  Psych:  Alert and oriented, normal mood and affect Cardiovascular:  RRR without murmur or gallop. no peripheral edema Respiratory:  Good breath sounds bilaterally, CTAB with normal respiratory effort Gastrointestinal: soft, flat abdomen, normal active bowel sounds, no palpable masses, no hepatosplenomegaly, no appreciated hernias, NO CVAT, mild suprapubic ttp w/o rebound or guarding Skin:  Warm, no rashes Neurologic:   Mental status is normal. normal gait Office Visit on 05/13/2019  Component Date Value Ref Range Status  . Color, UA 05/13/2019 Yellow   Final  . Clarity, UA 05/13/2019 Clear   Final  . Glucose, UA 05/13/2019 Negative  Negative Final  . Bilirubin, UA 05/13/2019 Negative   Final  . Ketones, UA 05/13/2019 Negative   Final  . Spec Grav, UA 05/13/2019 1.025  1.010 - 1.025 Final  . Blood, UA 05/13/2019 Negative   Final  . pH, UA 05/13/2019 6.0  5.0 - 8.0 Final  . Protein, UA 05/13/2019 Negative  Negative Final  . Urobilinogen, UA 05/13/2019 0.2  0.2 or 1.0 E.U./dL Final  . Nitrite, UA 05/13/2019 Negative   Final  . Leukocytes, UA 05/13/2019 Small (1+)* Negative Final    Commons side effects, risks, benefits, and alternatives for medications and treatment plan prescribed today were discussed, and the patient expressed  understanding of the given instructions. Patient is instructed to call or message via MyChart if he/she has any questions or concerns regarding our treatment plan. No barriers to understanding were identified. We discussed Red Flag symptoms and signs in detail. Patient expressed understanding regarding what to do in case of urgent or emergency type symptoms.   Medication list was reconciled, printed and provided to the patient in AVS. Patient instructions and summary information was reviewed with the patient as documented in the AVS. This note was prepared with assistance of Dragon voice recognition software. Occasional wrong-word or sound-a-like substitutions may have occurred due to the inherent limitations of voice recognition software

## 2019-05-13 NOTE — Patient Instructions (Addendum)
Please return in January for your annual complete physical; please come fasting.  Medicare recommends an Annual Wellness Visit for all patients. Please schedule this to be done with our Nurse Educator, Loma Sousa. This is an informative "talk" visit; it's goals are to ensure that your health care needs are being met and to give you education regarding avoiding falls, ensuring you are not suffering from depression or problems with memory or thinking, and to educate you on Advance Care Planning. It helps me take good care of you!  Please call Dr. Corena Pilgrim office to schedule your colonoscopy that is due in October.   I've ordered your mammogram and bone density test to be done.   You received the high dose flu shot today.   If you have any questions or concerns, please don't hesitate to send me a message via MyChart or call the office at [phone]. Thank you for visiting with [name] today! It's our pleasure caring for you.  Complete your antibiotics and let me know if things don't improve.

## 2019-05-15 LAB — URINE CULTURE
MICRO NUMBER:: 793492
SPECIMEN QUALITY:: ADEQUATE

## 2019-05-17 NOTE — Progress Notes (Signed)
+   UTI sensitive to abx used. No further action needed

## 2019-05-24 ENCOUNTER — Telehealth: Payer: Self-pay | Admitting: Family Medicine

## 2019-05-24 NOTE — Telephone Encounter (Signed)
See note

## 2019-05-24 NOTE — Telephone Encounter (Signed)
Pt states that the Tara Murphy is telling her that she can't get rOPINIRole (REQUIP) 4 MG tablet because it has been called into another pharmacy and picked up.  Pt is confused and is out of medication and needs to know what to do.

## 2019-05-24 NOTE — Telephone Encounter (Signed)
Called pt back, she reports that last refill they were out of meds and only gave 5 days. The Krist[facility]en[facility] is resolving the problem and will refill

## 2019-05-27 ENCOUNTER — Ambulatory Visit: Payer: Medicare Other

## 2019-06-02 ENCOUNTER — Other Ambulatory Visit: Payer: Self-pay | Admitting: Family Medicine

## 2019-06-17 ENCOUNTER — Other Ambulatory Visit: Payer: Self-pay | Admitting: Family Medicine

## 2019-06-17 DIAGNOSIS — Z1239 Encounter for other screening for malignant neoplasm of breast: Secondary | ICD-10-CM

## 2019-06-17 DIAGNOSIS — E2839 Other primary ovarian failure: Secondary | ICD-10-CM

## 2019-06-30 ENCOUNTER — Encounter: Payer: Self-pay | Admitting: Gastroenterology

## 2019-09-02 ENCOUNTER — Other Ambulatory Visit: Payer: Self-pay | Admitting: Family Medicine

## 2019-09-02 DIAGNOSIS — Z853 Personal history of malignant neoplasm of breast: Secondary | ICD-10-CM

## 2019-09-06 ENCOUNTER — Ambulatory Visit
Admission: RE | Admit: 2019-09-06 | Discharge: 2019-09-06 | Disposition: A | Discharge status: Home / Self Care | Payer: Medicare Other | Source: Ambulatory Visit | Attending: Family Medicine | Admitting: Family Medicine

## 2019-09-06 ENCOUNTER — Other Ambulatory Visit: Payer: Self-pay

## 2019-09-06 DIAGNOSIS — Z853 Personal history of malignant neoplasm of breast: Secondary | ICD-10-CM

## 2019-09-06 DIAGNOSIS — E2839 Other primary ovarian failure: Secondary | ICD-10-CM

## 2019-09-07 ENCOUNTER — Encounter: Payer: Self-pay | Admitting: Family Medicine

## 2019-09-07 DIAGNOSIS — M8580 Other specified disorders of bone density and structure, unspecified site: Secondary | ICD-10-CM | POA: Insufficient documentation

## 2019-09-07 HISTORY — DX: Other specified disorders of bone density and structure, unspecified site: M85.80

## 2019-10-05 ENCOUNTER — Ambulatory Visit (INDEPENDENT_AMBULATORY_CARE_PROVIDER_SITE_OTHER): Payer: Medicare Other | Admitting: Family Medicine

## 2019-10-05 ENCOUNTER — Encounter: Payer: Self-pay | Admitting: Family Medicine

## 2019-10-05 VITALS — Ht 66.0 in | Wt 213.0 lb

## 2019-10-05 DIAGNOSIS — F430 Acute stress reaction: Secondary | ICD-10-CM

## 2019-10-05 DIAGNOSIS — Z87891 Personal history of nicotine dependence: Secondary | ICD-10-CM | POA: Diagnosis not present

## 2019-10-05 DIAGNOSIS — R0602 Shortness of breath: Secondary | ICD-10-CM

## 2019-10-05 DIAGNOSIS — J309 Allergic rhinitis, unspecified: Secondary | ICD-10-CM

## 2019-10-05 HISTORY — DX: Personal history of nicotine dependence: Z87.891

## 2019-10-05 MED ORDER — BUSPIRONE HCL 7.5 MG PO TABS: 7.5000 mg | ORAL_TABLET | Freq: Two times a day (BID) | ORAL | 1 refills | Status: DC | PRN

## 2019-10-05 NOTE — Patient Instructions (Signed)
Please schedule your complete physical. Your last was 09/2018  Medicare recommends an Annual Wellness Visit for all patients. Please schedule this to be done with our Nurse Educator, Loma Sousa. This is an informative "talk" visit; it's goals are to ensure that your health care needs are being met and to give you education regarding avoiding falls, ensuring you are not suffering from depression or problems with memory or thinking, and to educate you on Advance Care Planning. It helps me take good care of you!

## 2019-10-05 NOTE — Progress Notes (Signed)
Virtual Visit via Video Note  Subjective  CC:  Chief Complaint  Patient presents with  . Cough  . Chest Pain     I connected with Tara Murphy on 10/05/19 at  4:20 PM EST by a video enabled telemedicine application and verified that I am speaking with the correct person using two identifiers. Location patient: Home Location provider: [facility] [place], Office Persons participating in the virtual visit: [employer], Leamon Arnt, MD Serita Sheller, [place] discussed the limitations of evaluation and management by telemedicine and the availability of in person appointments. The patient expressed understanding and agreed to proceed. HPI: Tara Murphy is a 74 y.o. female who was contacted today to address the problems listed above in the chief complaint.  74 year old without history of lung disease but remote history of long-term smoking presents due to shortness of breath.  She is worried about her lungs.  She says of the last 3 days she has some heaviness in her chest.  She denies wheezing.  She has mild cough but this is mostly related to postnasal drainage today active allergy symptoms.  She has some congestion that is worse in the morning.  She has taken allergy medicine in the past but has not been on any recently.  She denies fevers, sore throat, productive cough.  She has fullness in her ears due to the fluid.  She has had no Covid exposures.  She rarely goes out.  She believes she is very low risk for Covid.  She has no myalgias, loss of sense of smell or taste but she is fatigued.  She has no GI symptoms.  Anxiety: Of note, she is extremely stressed.  Her daughter is undergoing a divorce and she is very worried about her.  Talking about things changed her eyes.  Her symptomatic shortness of breath may be related to anxiety.  She has no history of mood disorders.  She does feel panicky and would like help with that.  Review of systems negative for chest  pain, nausea, sweats, lower extremity edema.  Pain meds: She is not due for a complete physical at this time. Assessment  1. Chronic allergic rhinitis   2. Stress reaction   3. Shortness of breath   4. Former smoker, stopped smoking in distant past      Plan   Allergies: Allergic rhinitis.  Recommend restarting Allegra and Flonase.  She will recheck with me if symptoms are not improving.  There are no signs or symptoms of infection at this time.  However she will watch for such symptoms.  We will tested for Covid if they develop.  Shortness of breath: Multifactorial but mostly related to anxiety and stress.  She worries about lung cancer given her former history of smoking.  She also is breast cancer survivor.  She has not her chest x-ray in years and would like this done.  She denies pleuritic chest pain. She will follow-up in the office if dyspnea do not improve over the next 48 to 72 hours  Stress reaction: Some of her symptoms are due to anxiety and worry as noted above.  Counseling done.  BuSpar twice daily as needed.  I am hopeful this will help her symptoms.  If not, we can try benzodiazepine.  Risk and benefit discussed.  Close follow-up recommended  Recommend restarting Crestor  Recommend making appointment for complete physical I discussed the assessment and treatment plan with the patient.  The patient was provided an opportunity to ask questions and all were answered. The patient agreed with the plan and demonstrated an understanding of the instructions.   The patient was advised to call back or seek an in-person evaluation if the symptoms worsen or if the condition fails to improve as anticipated. Follow up: Return office if shortness of breath next 48 to 72 hours.  Schedule complete physical Visit date not found  Meds ordered this encounter  Medications  . busPIRone (BUSPAR) 7.5 MG tablet    Sig: Take 1 tablet (7.5 mg total) by mouth 2 (two) times daily as needed.     Dispense:  60 tablet    Refill:  1      I reviewed the patients updated PMH, FH, and SocHx.    Patient Active Problem List   Diagnosis Date Noted  . 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    Priority: High  . Familial hyperlipidemia 11/20/2017    Priority: High  . History of left breast cancer 03/20/2015    Priority: High  . Former smoker, stopped smoking in distant past 10/05/2019    Priority: Medium  . Osteopenia 09/07/2019    Priority: Medium  . IBS (irritable bowel syndrome) 10/08/2018    Priority: Medium  . GERD (gastroesophageal reflux disease) 11/20/2017    Priority: Medium  . Restless leg syndrome 11/20/2017    Priority: Medium  . OA (osteoarthritis) of knee 12/18/2015    Priority: Medium  . OA (osteoarthritis) of hip 12/18/2015    Priority: Medium  . Osteoarthritis of spine with radiculopathy, 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    Priority: Medium  . History of Clostridium difficile colitis 06/16/2015    Priority: Medium  . 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 Neuropathy, peripheral, idiopathic 06/13/2015    Priority: Medium  . Hammer toe 04/13/2018    Priority: Low  . Osteoarthritis of right foot 04/13/2018    Priority: Low  . 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 Essential tremor 09/26/2016    Priority: Low  . Chronic allergic rhinitis 06/13/2015    Priority: Low  . Recurrent urinary tract infection 06/13/2015    Priority: Low   Current Meds  Medication Sig  . gabapentin (NEURONTIN) 300 MG capsule Take 2 capsules (600 mg total) by mouth 2 (two) times daily.  Marland Kitchen rOPINIRole (REQUIP) 4 MG tablet TAKE ONE TABLET BY MOUTH AT BEDTIME  . traMADol (ULTRAM) 50 MG tablet Take 1 tablet (50 mg total) by mouth daily as needed.    Allergies: Patient is allergic to contrast media [iodinated diagnostic agents]; hydromorphone hcl; other; sulfamethoxazole-trimethoprim; fentanyl; tape; and  terbinafine and related. Family History: Patient family history includes Arthritis in her father and mother; Breast cancer (age of onset: 48) in her sister; Crohn's disease in her mother; Diabetes in her mother; Hyperlipidemia in her maternal aunt; Lung cancer in her father; Stomach cancer in her maternal grandmother. Social History:  Patient  reports that she quit smoking about 27 years ago. Her smoking use included cigarettes. She has never used smokeless tobacco. She reports current alcohol use of about 4.0 standard drinks of alcohol per week. She reports that she does not use drugs.  Review of Systems: Constitutional: Negative for fever malaise or anorexia Cardiovascular: negative for chest pain Respiratory: negative for SOB or persistent cough Gastrointestinal: negative for abdominal pain  OBJECTIVE Vitals: Ht 5\' 6"  (1.676 m)  Wt 213 lb (96.6 kg)   BMI 34.38 kg/m  General: no acute distress , A&Ox3, no respiratory distress Psych: Appears anxious  Leamon Arnt, MD

## 2019-10-14 ENCOUNTER — Ambulatory Visit: Payer: Medicare Other | Attending: Internal Medicine

## 2019-10-14 DIAGNOSIS — Z23 Encounter for immunization: Secondary | ICD-10-CM | POA: Insufficient documentation

## 2019-10-14 NOTE — Progress Notes (Signed)
   Covid-19 Vaccination Clinic  Name:  Tara Murphy    MRN: [id number] DOB: [birth year]  10/14/2019  [name] was observed post Covid-19 immunization for 15 minutes without incidence. She was provided with Vaccine Information Sheet and instruction to access the V-Safe system.   [name] was instructed to call 911 with any severe reactions post vaccine: Marland Kitchen Difficulty breathing  . Swelling of your face and throat  . A fast heartbeat  . A bad rash all over your body  . Dizziness and weakness    Immunizations Administered    Name Date Dose VIS Date Route   Pfizer COVID-19 Vaccine 10/14/2019  9:01 AM 0.3 mL 09/03/2019 Intramuscular   Manufacturer: [place]   Lot: [id number]   [place]: [id number]

## 2019-11-03 ENCOUNTER — Ambulatory Visit: Payer: Medicare Other | Attending: Internal Medicine

## 2019-11-03 DIAGNOSIS — Z23 Encounter for immunization: Secondary | ICD-10-CM | POA: Insufficient documentation

## 2019-11-03 NOTE — Progress Notes (Signed)
   Covid-19 Vaccination Clinic  Name:  Rashunda Passon    MRN: [id number] DOB: 13-Feb-1946  11/03/2019  Ms. Mascaro was observed post Covid-19 immunization for 15 minutes without incidence. She was provided with Vaccine Information Sheet and instruction to access the V-Safe system.   Ms. Sumpter was instructed to call 911 with any severe reactions post vaccine: Marland Kitchen Difficulty breathing  . Swelling of your face and throat  . A fast heartbeat  . A bad rash all over your body  . Dizziness and weakness    Immunizations Administered    Name Date Dose VIS Date Route   Pfizer COVID-19 Vaccine 11/03/2019  1:37 PM 0.3 mL 09/03/2019 Intramuscular   Manufacturer: [place]   Lot: [id number]   [place]: [id number]

## 2019-11-05 ENCOUNTER — Encounter: Payer: Self-pay | Admitting: Family Medicine

## 2019-11-05 MED ORDER — PHENAZOPYRIDINE HCL 200 MG PO TABS: 200.0000 mg | ORAL_TABLET | Freq: Three times a day (TID) | ORAL | 0 refills | Status: DC | PRN

## 2019-11-05 MED ORDER — CIPROFLOXACIN HCL 500 MG PO TABS: 500.0000 mg | ORAL_TABLET | Freq: Two times a day (BID) | ORAL | 0 refills | Status: AC

## 2019-12-02 ENCOUNTER — Other Ambulatory Visit: Payer: Self-pay | Admitting: Family Medicine

## 2019-12-05 ENCOUNTER — Other Ambulatory Visit: Payer: Self-pay | Admitting: Family Medicine

## 2019-12-27 ENCOUNTER — Encounter: Payer: Self-pay | Admitting: Family Medicine

## 2019-12-27 ENCOUNTER — Ambulatory Visit (INDEPENDENT_AMBULATORY_CARE_PROVIDER_SITE_OTHER): Payer: Medicare Other | Admitting: Family Medicine

## 2019-12-27 ENCOUNTER — Ambulatory Visit (INDEPENDENT_AMBULATORY_CARE_PROVIDER_SITE_OTHER): Payer: Medicare Other

## 2019-12-27 ENCOUNTER — Other Ambulatory Visit: Payer: Self-pay

## 2019-12-27 VITALS — BP 120/76 | HR 73 | Temp 98.1°F | Ht 66.0 in | Wt 216.0 lb

## 2019-12-27 DIAGNOSIS — R05 Cough: Secondary | ICD-10-CM

## 2019-12-27 DIAGNOSIS — R053 Chronic cough: Secondary | ICD-10-CM

## 2019-12-27 DIAGNOSIS — R399 Unspecified symptoms and signs involving the genitourinary system: Secondary | ICD-10-CM | POA: Diagnosis not present

## 2019-12-27 LAB — POCT URINALYSIS DIPSTICK
Bilirubin, UA: NEGATIVE
Blood, UA: POSITIVE
Glucose, UA: NEGATIVE
Ketones, UA: NEGATIVE
Nitrite, UA: NEGATIVE
Protein, UA: NEGATIVE
Spec Grav, UA: 1.015 (ref 1.010–1.025)
Urobilinogen, UA: 0.2 E.U./dL
pH, UA: 6 (ref 5.0–8.0)

## 2019-12-27 MED ORDER — CIPROFLOXACIN HCL 500 MG PO TABS: 500.0000 mg | ORAL_TABLET | Freq: Two times a day (BID) | ORAL | 0 refills | Status: DC

## 2019-12-27 NOTE — Patient Instructions (Signed)
It was nice to see you!  You have a urinary tract infection. Please start the antibiotic.  We will check a urine culture to make sure you do not have a resistant bacteria. We will call you if we need to change your medications.   Please make sure you are drinking plenty of fluids over the next few days.  If your symptoms do not improve over the next 5-7 days, or if they worsen, please let us know. Please also let us know if you have worsening back pain, fevers, chills, or body aches.   We will also check an xray today.   Take care, Dr Jerline Pain

## 2019-12-27 NOTE — Progress Notes (Signed)
   Charlise Olsen is a 74 y.o. female who presents today for an office visit.  Assessment/Plan:  New/Acute Problems: UTI History and UA consistent with UTI.  Will start ciprofloxacin but this is worked well for her in the past.  Will check urine culture.  Encouraged good oral hydration.  Discussed reasons to return to care.  Chronic Problems Addressed Today: Cough Normal heart and lung exam today.  Will check chest x-ray per patient request as cough has been persistent for over a year and she is a former smoker.    Subjective:  HPI:  Started this morning. Feels like prior UTI. Some malodor and urgency. No fevers or chills. No back pain.  No nausea or vomiting.  Patient has also had a cough for the past year.  She is worried because she is a former smoker.  She would like to have an x-ray done today.         Objective:  Physical Exam: BP 120/76 (BP Location: Right Arm, Patient Position: Sitting, Cuff Size: Normal)   Pulse 73   Temp 98.1 F (36.7 C) (Temporal)   Ht 5\' 6"  (1.676 m)   Wt 216 lb (98 kg)   SpO2 98%   BMI 34.86 kg/m   Gen: No acute distress, resting comfortably CV: Regular rate and rhythm with no murmurs appreciated Pulm: Normal work of breathing, clear to auscultation bilaterally with no crackles, wheezes, or rhonchi Neuro: Grossly normal, moves all extremities Psych: Normal affect and thought content      Allanna Bresee M. Jerline Pain, MD 12/27/2019 1:27 PM

## 2019-12-28 NOTE — Progress Notes (Signed)
Please inform patient of the following:  Xrays shows chronic bronchitis changes. This is due to her smoking history. Do not need to do anything urgently, but recommend she follow up with her PCP to discuss treatment options if she wishes.

## 2019-12-29 LAB — URINE CULTURE
MICRO NUMBER:: 10327605
SPECIMEN QUALITY:: ADEQUATE

## 2019-12-30 ENCOUNTER — Encounter: Payer: Self-pay | Admitting: Family Medicine

## 2019-12-30 NOTE — Progress Notes (Signed)
Please inform patient of the following:  Urine culture confirms UTI. The antibiotics she is on should treat this. Would like for her to let us know if her symptoms are not improving.  Tara Murphy. Jerline Pain, MD 12/30/2019 8:05 AM

## 2019-12-31 MED ORDER — CLOBETASOL PROPIONATE 0.05 % EX SOLN: 1.0000 "application " | Freq: Two times a day (BID) | CUTANEOUS | 1 refills | Status: DC | PRN

## 2019-12-31 MED ORDER — TRAMADOL HCL 50 MG PO TABS: 50.0000 mg | ORAL_TABLET | Freq: Every day | ORAL | 2 refills | Status: DC | PRN

## 2019-12-31 MED ORDER — FLUTICASONE PROPIONATE 50 MCG/ACT NA SUSP: 2.0000 | Freq: Every day | NASAL | 6 refills | Status: DC

## 2020-01-10 ENCOUNTER — Ambulatory Visit (INDEPENDENT_AMBULATORY_CARE_PROVIDER_SITE_OTHER): Payer: Medicare Other

## 2020-01-10 ENCOUNTER — Other Ambulatory Visit: Payer: Self-pay

## 2020-01-10 VITALS — BP 130/68 | HR 70 | Temp 97.1°F | Ht 66.0 in | Wt 213.0 lb

## 2020-01-10 DIAGNOSIS — Z0000 Encounter for general adult medical examination without abnormal findings: Secondary | ICD-10-CM | POA: Diagnosis not present

## 2020-01-10 NOTE — Patient Instructions (Signed)
Tara Murphy , Thank you for taking time to come for your Medicare Wellness Visit. I appreciate your ongoing commitment to your health goals. Please review the following plan we discussed and let me know if I can assist you in the future.   Screening recommendations/referrals: Colorectal Screening: up to date; last colonoscopy 07/02/18 Mammogram: up to date; last 09/06/19 Bone Density: up to date; last 09/06/19  Vision and Dental Exams: Recommended annual ophthalmology exams for early detection of glaucoma and other disorders of the eye Recommended annual dental exams for proper oral hygiene  Vaccinations: Influenza vaccine: completed 05/13/19 Pneumococcal vaccine: up to date; last 08/15/14 Tdap vaccine: up to date; last 06/23/12 ; Declined. Please call your insurance company to determine your out of pocket expense. You may also receive this vaccine at your local pharmacy or Health Dept. Shingles vaccine: Shingrix completed  Covid vaccine:  Completed   Advanced directives: Please bring a copy of your POA (Power of Attorney) and/or Living Will to your next appointment.  Goals: Recommend to drink at least 6-8 8oz glasses of water per day and consume a balanced diet rich in fresh fruits and vegetables.   Next appointment: Please schedule your Annual Wellness Visit with your Nurse Health Advisor in one year.  Preventive Care 28 Years and Older, Female Preventive care refers to lifestyle choices and visits with your health care provider that can promote health and wellness. What does preventive care include?  A yearly physical exam. This is also called an annual well check.  Dental exams once or twice a year.  Routine eye exams. Ask your health care provider how often you should have your eyes checked.  Personal lifestyle choices, including:  Daily care of your teeth and gums.  Regular physical activity.  Eating a healthy diet.  Avoiding tobacco and drug use.  Limiting alcohol  use.  Practicing safe sex.  Taking low-dose aspirin every day if recommended by your health care provider.  Taking vitamin and mineral supplements as recommended by your health care provider. What happens during an annual well check? The services and screenings done by your health care provider during your annual well check will depend on your age, overall health, lifestyle risk factors, and family history of disease. Counseling  Your health care provider may ask you questions about your:  Alcohol use.  Tobacco use.  Drug use.  Emotional well-being.  Home and relationship well-being.  Sexual activity.  Eating habits.  History of falls.  Memory and ability to understand (cognition).  Work and work Statistician.  Reproductive health. Screening  You may have the following tests or measurements:  Height, weight, and BMI.  Blood pressure.  Lipid and cholesterol levels. These may be checked every 5 years, or more frequently if you are over 55 years old.  Skin check.  Lung cancer screening. You may have this screening every year starting at age 37 if you have a 30-pack-year history of smoking and currently smoke or have quit within the past 15 years.  Fecal occult blood test (FOBT) of the stool. You may have this test every year starting at age 32.  Flexible sigmoidoscopy or colonoscopy. You may have a sigmoidoscopy every 5 years or a colonoscopy every 10 years starting at age 83.  Hepatitis C blood test.  Hepatitis B blood test.  Sexually transmitted disease (STD) testing.  Diabetes screening. This is done by checking your blood sugar (glucose) after you have not eaten for a while (fasting). You may have  this done every 1-3 years.  Bone density scan. This is done to screen for osteoporosis. You may have this done starting at age 37.  Mammogram. This may be done every 1-2 years. Talk to your health care provider about how often you should have regular  mammograms. Talk with your health care provider about your test results, treatment options, and if necessary, the need for more tests. Vaccines  Your health care provider may recommend certain vaccines, such as:  Influenza vaccine. This is recommended every year.  Tetanus, diphtheria, and acellular pertussis (Tdap, Td) vaccine. You may need a Td booster every 10 years.  Zoster vaccine. You may need this after age 58.  Pneumococcal 13-valent conjugate (PCV13) vaccine. One dose is recommended after age 39.  Pneumococcal polysaccharide (PPSV23) vaccine. One dose is recommended after age 51. Talk to your health care provider about which screenings and vaccines you need and how often you need them. This information is not intended to replace advice given to you by your health care provider. Make sure you discuss any questions you have with your health care provider. Document Released: 10/06/2015 Document Revised: 05/29/2016 Document Reviewed: 07/11/2015 Elsevier Interactive Patient Education  2017 [place] Prevention in the Home Falls can cause injuries. They can happen to people of all ages. There are many things you can do to make your home safe and to help prevent falls. What can I do on the outside of my home?  Regularly fix the edges of walkways and driveways and fix any cracks.  Remove anything that might make you trip as you walk through a door, such as a raised step or threshold.  Trim any bushes or trees on the path to your home.  Use bright outdoor lighting.  Clear any walking paths of anything that might make someone trip, such as rocks or tools.  Regularly check to see if handrails are loose or broken. Make sure that both sides of any steps have handrails.  Any raised decks and porches should have guardrails on the edges.  Have any leaves, snow, or ice cleared regularly.  Use sand or salt on walking paths during winter.  Clean up any spills in your garage  right away. This includes oil or grease spills. What can I do in the bathroom?  Use night lights.  Install grab bars by the toilet and in the tub and shower. Do not use towel bars as grab bars.  Use non-skid mats or decals in the tub or shower.  If you need to sit down in the shower, use a plastic, non-slip stool.  Keep the floor dry. Clean up any water that spills on the floor as soon as it happens.  Remove soap buildup in the tub or shower regularly.  Attach bath mats securely with double-sided non-slip rug tape.  Do not have throw rugs and other things on the floor that can make you trip. What can I do in the bedroom?  Use night lights.  Make sure that you have a light by your bed that is easy to reach.  Do not use any sheets or blankets that are too big for your bed. They should not hang down onto the floor.  Have a firm chair that has side arms. You can use this for support while you get dressed.  Do not have throw rugs and other things on the floor that can make you trip. What can I do in the kitchen?  Clean up any  spills right away.  Avoid walking on wet floors.  Keep items that you use a lot in easy-to-reach places.  If you need to reach something above you, use a strong step stool that has a grab bar.  Keep electrical cords out of the way.  Do not use floor polish or wax that makes floors slippery. If you must use wax, use non-skid floor wax.  Do not have throw rugs and other things on the floor that can make you trip. What can I do with my stairs?  Do not leave any items on the stairs.  Make sure that there are handrails on both sides of the stairs and use them. Fix handrails that are broken or loose. Make sure that handrails are as long as the stairways.  Check any carpeting to make sure that it is firmly attached to the stairs. Fix any carpet that is loose or worn.  Avoid having throw rugs at the top or bottom of the stairs. If you do have throw rugs,  attach them to the floor with carpet tape.  Make sure that you have a light switch at the top of the stairs and the bottom of the stairs. If you do not have them, ask someone to add them for you. What else can I do to help prevent falls?  Wear shoes that:  Do not have high heels.  Have rubber bottoms.  Are comfortable and fit you well.  Are closed at the toe. Do not wear sandals.  If you use a stepladder:  Make sure that it is fully opened. Do not climb a closed stepladder.  Make sure that both sides of the stepladder are locked into place.  Ask someone to hold it for you, if possible.  Clearly mark and make sure that you can see:  Any grab bars or handrails.  First and last steps.  Where the edge of each step is.  Use tools that help you move around (mobility aids) if they are needed. These include:  Canes.  Walkers.  Scooters.  Crutches.  Turn on the lights when you go into a dark area. Replace any light bulbs as soon as they burn out.  Set up your furniture so you have a clear path. Avoid moving your furniture around.  If any of your floors are uneven, fix them.  If there are any pets around you, be aware of where they are.  Review your medicines with your doctor. Some medicines can make you feel dizzy. This can increase your chance of falling. Ask your doctor what other things that you can do to help prevent falls. This information is not intended to replace advice given to you by your health care provider. Make sure you discuss any questions you have with your health care provider. Document Released: 07/06/2009 Document Revised: 02/15/2016 Document Reviewed: 10/14/2014 Elsevier Interactive Patient Education  2017 [employer].

## 2020-01-10 NOTE — Progress Notes (Signed)
Subjective:   Tara Murphy is a 74 y.o. female who presents for an Initial Medicare Annual Wellness Visit.  Review of Systems     Cardiac Risk Factors include: advanced age (>60men, >13 women);dyslipidemia    Objective:    Today's Vitals   01/10/20 1044  BP: 130/68  Pulse: 70  Temp: (!) 97.1 F (36.2 C)  TempSrc: Temporal  SpO2: 94%  Weight: 213 lb (96.6 kg)  Height: 5\' 6"  (1.676 m)   Body mass index is 34.38 kg/m.  Advanced Directives 01/10/2020 11/25/2016 11/19/2016 10/29/2016 10/28/2016 09/05/2016 12/18/2015  Does Patient Have a Medical Advance Directive? Yes Yes Yes Yes Yes No Yes  Type of Advance Directive Living will;Healthcare Power of Attorney Living will Living will Living will Living will - [place];Living will  Does patient want to make changes to medical advance directive? No - Patient declined No - Patient declined No - Patient declined No - Patient declined - - No - Patient declined  Copy of [place] in Chart? No - copy requested - - - - - No - copy requested    Current Medications (verified) Outpatient Encounter Medications as of 01/10/2020  Medication Sig  . atorvastatin (LIPITOR) 10 MG tablet TAKE ONE TABLET BY MOUTH DAILY (Patient not taking: Reported on 10/05/2019)  . busPIRone (BUSPAR) 7.5 MG tablet TAKE ONE TABLET BY MOUTH TWICE A DAY AS NEEDED  . clobetasol (TEMOVATE) 0.05 % external solution Apply 1 application topically 2 (two) times daily as needed (for scalp inflammation).  . fluticasone (FLONASE) 50 MCG/ACT nasal spray Place 1 spray into the nose daily as needed for allergies or rhinitis.   . fluticasone (FLONASE) 50 MCG/ACT nasal spray Place 2 sprays into both nostrils daily.  Marland Kitchen gabapentin (NEURONTIN) 300 MG capsule TAKE TWO CAPSULES BY MOUTH TWO TIMES A DAY  . rOPINIRole (REQUIP) 4 MG tablet TAKE ONE TABLET BY MOUTH AT BEDTIME  . traMADol (ULTRAM) 50 MG tablet Take 1 tablet (50 mg total) by mouth daily as  needed.  . [DISCONTINUED] ciprofloxacin (CIPRO) 500 MG tablet Take 1 tablet (500 mg total) by mouth 2 (two) times daily.   No facility-administered encounter medications on file as of 01/10/2020.    Allergies (verified) Contrast media [iodinated diagnostic agents], Hydromorphone hcl, Other, Sulfamethoxazole-trimethoprim, Fentanyl, Tape, and Terbinafine and related   History: Past Medical History:  Diagnosis Date  . Allergy   . Arthritis   . 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HCC)    S/p total hip replacement left  . Breast cancer ([place]) 2015   left   . Cataract   . Cholecystolithiasis   . Chronic allergic rhinitis   . Depression   . Former smoker, stopped smoking in distant past 10/05/2019   Quit 2000; 63 y smoking history  . GERD (gastroesophageal reflux disease)   . Hyperlipemia   . IBS (irritable bowel syndrome) 10/08/2018   Nl colonoscopy and EGD 09/2018  . Lichen plano-pilaris   . 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 Neuropathy, peripheral, idiopathic    uses gabapentin  . Osteopenia 09/07/2019   Dexa 08/2019: T = -1.2 at wrist; back and hips excluded (DJD and hardware).   . Personal history of radiation therapy 2015  . 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 Repeat 2020  . Urinary tract infection    hx of frequent    Past Surgical History:  Procedure Laterality Date  . APPENDECTOMY    .  BREAST LUMPECTOMY Left 2015  . cataracts    . CHOLECYSTECTOMY N/A 10/29/2016   Procedure: LAPAROSCOPIC CHOLECYSTECTOMY;  Surgeon: Erroll Luna, MD;  Location: [facility];  Service: General;  Laterality: N/A;  . LIPOSUCTION    . TOTAL HIP ARTHROPLASTY     bilateral hip arthoplasty  . TOTAL HIP ARTHROPLASTY Right 12/18/2015   Procedure: RIGHT TOTAL HIP ARTHROPLASTY ANTERIOR APPROACH;  Surgeon: Gaynelle Arabian, MD;  Location: [facility];  Service: Orthopedics;  Laterality: Right;  . TOTAL KNEE ARTHROPLASTY    . TOTAL KNEE ARTHROPLASTY  Left 11/25/2016   Procedure: LEFT TOTAL KNEE ARTHROPLASTY;  Surgeon: Gaynelle Arabian, MD;  Location: [facility];  Service: Orthopedics;  Laterality: Left;  with abductor block  . TUBAL LIGATION     Family History  Problem Relation Age of Onset  . Arthritis Mother   . Diabetes Mother   . Crohn's disease Mother   . Arthritis Father   . Lung cancer Father   . Breast cancer Sister 47  . Hyperlipidemia Maternal Aunt   . Stomach cancer Maternal Grandmother   . Colitis Neg Hx   . Rectal cancer Neg Hx   . Esophageal cancer Neg Hx    Social History   Socioeconomic History  . Marital status: Married    Spouse name: Not on file  . Number of children: 2  . Years of education: Not on file  . Highest education level: Not on file  Occupational History  . Occupation: Retired     Comment: Oceanographer   Tobacco Use  . Smoking status: Former Smoker    Types: Cigarettes    Quit date: 11/22/1991    Years since quitting: 28.1  . Smokeless tobacco: Never Used  Substance and Sexual Activity  . Alcohol use: Yes    Alcohol/week: 4.0 standard drinks    Types: 4 Glasses of wine per week  . Drug use: No  . Sexual activity: Not on file  Other Topics Concern  . Not on file  Social History Narrative   4 grandchildren and 3 step    Enjoys painting, and gardening, and home decorating    Social Determinants of Health   Financial Resource Strain:   . Difficulty of Paying Living Expenses:   Food Insecurity:   . Worried About Charity fundraiser in the Last Year:   . Arboriculturist in the Last Year:   Transportation Needs:   . Film/video editor (Medical):   Marland Kitchen Lack of Transportation (Non-Medical):   Physical Activity:   . Days of Exercise per Week:   . Minutes of Exercise per Session:   Stress:   . Feeling of Stress :   Social Connections:   . Frequency of Communication with Friends and Family:   . Frequency of Social Gatherings with Friends and Family:   . Attends Religious Services:     . Active Member of Clubs or Organizations:   . Attends Archivist Meetings:   Marland Kitchen Marital Status:     Tobacco Counseling Counseling given: Not Answered   Clinical Intake:  Pre-visit preparation completed: Yes  Pain : No/denies pain  Diabetes: No  How often do you need to have someone help you when you read instructions, pamphlets, or other written materials from your doctor or pharmacy?: 1 - Never  Interpreter Needed?: No  Information entered by :: Denman George LPN   Activities of Daily Living In your present state of health, do you have any difficulty  performing the following activities: 01/10/2020  Hearing? N  Vision? N  Difficulty concentrating or making decisions? N  Walking or climbing stairs? N  Comment intermittent due to foot pain  Dressing or bathing? N  Doing errands, shopping? N  Preparing Food and eating ? N  Using the Toilet? N  In the past six months, have you accidently leaked urine? N  Do you have problems with loss of bowel control? N  Managing your Medications? N  Managing your Finances? N  Housekeeping or managing your Housekeeping? N  Some recent data might be hidden     Immunizations and Health Maintenance Immunization History  Administered Date(s) Administered  . Fluad Quad(high Dose 65+) 05/13/2019  . Influenza Split 07/18/2011, 07/21/2012, 06/16/2013, 08/15/2014  . Influenza, High Dose Seasonal PF 06/13/2015, 09/26/2016, 10/02/2017  . Influenza, Quadrivalent, Recombinant, Inj, Pf 10/02/2017  . Influenza,inj,Quad PF,6+ Mos 10/02/2017, 07/06/2018  . Influenza,inj,quad, With Preservative 06/23/2017  . PFIZER SARS-COV-2 Vaccination 10/14/2019, 11/03/2019  . Pneumococcal Conjugate-13 08/15/2014  . Pneumococcal Polysaccharide-23 06/16/2013  . Tdap 06/23/2012  . Zoster 01/25/2011  . Zoster Recombinat (Shingrix) 10/26/2018, 03/09/2019   There are no preventive care reminders to display for this patient.  Patient Care  Team: Leamon Arnt, MD as PCP - General (Family Medicine) Loletha Carrow Kirke Corin, MD as Consulting Physician (Gastroenterology) Ulla Gallo, MD as Consulting Physician (Dermatology) Wylene Simmer, MD as Consulting Physician (Orthopedic Surgery)  Indicate any recent Medical Services you may have received from other than [facility] providers in the past year (date may be approximate).     Assessment:   This is a routine wellness examination for Tara Murphy.  Hearing/Vision screen No exam data present  Dietary issues and exercise activities discussed: Current Exercise Habits: Home exercise routine, Type of exercise: walking;Other - see comments(yardwork/gardening), Time (Minutes): 30, Frequency (Times/Week): 4, Weekly Exercise (Minutes/Week): 120  Goals   None    Depression Screen PHQ 2/9 Scores 01/10/2020 10/08/2018 04/06/2018 11/20/2017  PHQ - 2 Score 0 0 0 0  PHQ- 9 Score - 0 0 0    Fall Risk Fall Risk  01/10/2020 10/08/2018 11/20/2017  Falls in the past year? 0 0 No  Number falls in past yr: 0 0 -  Injury with Fall? 0 0 -  Follow up Falls evaluation completed;Education provided;Falls prevention discussed Falls evaluation completed -    Is the patient's home free of loose throw rugs in walkways, pet beds, electrical cords, etc?   yes      Grab bars in the bathroom? yes      Handrails on the stairs?   yes      Adequate lighting?   yes  Timed Get Up and Go Performed completed and within normal timeframe; no gait abnormalities noted   Cognitive Function: no cognitive concerns at this time    6CIT Screen 01/10/2020  What Year? 0 points  What month? 0 points  What time? 0 points  Count back from 20 0 points  Months in reverse 0 points  Repeat phrase 0 points  Total Score 0    Screening Tests Health Maintenance  Topic Date Due  . COLONOSCOPY  01/09/2021 (Originally 07/03/2019)  . INFLUENZA VACCINE  04/23/2020  . MAMMOGRAM  09/05/2020  . DEXA SCAN  09/05/2021  . TETANUS/TDAP   06/23/2022  . COVID-19 Vaccine  Completed  . Hepatitis C Screening  Completed  . PNA vac Low Risk Adult  Completed    Qualifies for Shingles Vaccine? Shingrix completed  Cancer Screenings: Lung: Low Dose CT Chest recommended if Age 36-80 years, 30 pack-year currently smoking OR have quit w/in 15years. Patient does not qualify. Breast: Up to date on Mammogram? Yes   Up to date of Bone Density/Dexa? Yes Colorectal: colonoscopy 07/02/18   Plan:  I have personally reviewed and addressed the Medicare Annual Wellness questionnaire and have noted the following in the patient's chart:  A. Medical and social history B. Use of alcohol, tobacco or illicit drugs  C. Current medications and supplements D. Functional ability and status E.  Nutritional status F.  Physical activity G. Advance directives H. List of other physicians I.  Hospitalizations, surgeries, and ER visits in previous 12 months J.  [place] such as hearing and vision if needed, cognitive and depression L. Referrals, records requested, and appointments- none   In addition, I have reviewed and discussed with patient certain preventive protocols, quality metrics, and best practice recommendations. A written personalized care plan for preventive services as well as general preventive health recommendations were provided to patient.   Signed,  Denman George, LPN  Nurse Health Advisor   Nurse Notes: no additional

## 2020-01-14 DIAGNOSIS — M19071 Primary osteoarthritis, right ankle and foot: Secondary | ICD-10-CM | POA: Diagnosis not present

## 2020-02-03 ENCOUNTER — Telehealth: Payer: Self-pay | Admitting: Family Medicine

## 2020-02-03 DIAGNOSIS — H532 Diplopia: Secondary | ICD-10-CM | POA: Diagnosis not present

## 2020-02-03 NOTE — Telephone Encounter (Signed)
Nurse Assessment Nurse: Harlow Mares, RN, Suanne Marker Date/Time (Eastern Time): 02/03/2020 9:36:49 AM Confirm and document reason for call. If symptomatic, describe symptoms. ---PT having double vision that lasted about 30 seconds, nauseated and fatigue. Dr Jonni Sanger not listed. Has the patient had close contact with a person known or suspected to have the novel coronavirus illness OR traveled / lives in area with major community spread (including international travel) in the last 14 days from the onset of symptoms? * If Asymptomatic, screen for exposure and travel within the last 14 days. ---No Does the patient have any new or worsening symptoms? ---Yes Will a triage be completed? ---Yes Related visit to physician within the last 2 weeks? ---No Does the PT have any chronic conditions? (i.e. diabetes, asthma, this includes High risk factors for pregnancy, etc.) ---Yes List chronic conditions. ---neuropathy of toes; arthritis; restless leg; Is this a behavioral health or substance abuse call? ---No Guidelines Guideline Title Affirmed Question Affirmed Notes Nurse Date/Time Eilene Ghazi Time) Vision Loss or Change Double vision Harlow Mares, RN, Suanne Marker 02/03/2020 9:39:46 AM Disp. Time Eilene Ghazi Time) Disposition Final User 02/03/2020 9:24:11 AM Attempt made - message left Ferd Glassing 02/03/2020 9:47:12 AM Go to ED Now (or PCP triage) Yes Harlow Mares, RN, RhondaPLEASE NOTE: All timestamps contained within this report are represented as [place] Standard Time. CONFIDENTIALTY NOTICE: This fax transmission is intended only for the addressee. It contains information that is legally privileged, confidential or otherwise protected from use or disclosure. If you are not the intended recipient, you are strictly prohibited from reviewing, disclosing, copying using or disseminating any of this information or taking any action in reliance on or regarding this information. If you have received this fax in error, please notify us  immediately by telephone so that we can arrange for its return to [name]. Phone: [phone], Toll-Free: [phone], Fax: [phone] Page: 2 of 2 Call Id: [id number] Caller Disagree/Comply Comply Caller Understands Yes PreDisposition Call Doctor Care Advice Given Per Guideline GO TO ED NOW (OR PCP TRIAGE): * IF NO PCP (PRIMARY CARE PROVIDER) SECOND-LEVEL TRIAGE: You need to be seen within the next hour. Go to the [facility] at _____________ [place] as soon as you can. ALTERNATE DISPOSITION - CALL OPHTHALMOLOGIST NOW: * If you have a private ophthalmologist (eye doctor), call the eye doctor now. * If you haven't heard from the eye doctor within 30 minutes, go to the emergency department. ANOTHER ADULT SHOULD DRIVE: * It is better and safer if another adult drives instead of you. CARE ADVICE given per Vision Loss or Change (Adult) guideline.

## 2020-02-03 NOTE — Telephone Encounter (Signed)
Received a call from [place] at [facility] Ophthalmologist requesting to speak with Dr.Andy in regards to the patient. After notifying him that Dr.Andy and her CMA were out of the office, offered to take a message he stated that the patient came to his office for blurred vision, light headedness, and pressure like headache. After examining patient, Dr.Bowen noticed patient's BP was a little high 174/64, but found nothing to be wrong. He advised that he would prefer to have the patient be seen in our office today for an EKG, spoke with Bevelyn Ngo and she advised that the patient go to ED to which Dr.Bowen responded with his advice that he does not think patient needed to go to ED, and thought it could not have been a stroke but had possible TIA. Spoke with Dr.Wolfe and she advised that the patient needed to go to an urgent care or to the ED for the EKG as we did not have any availability today.

## 2020-02-04 ENCOUNTER — Encounter: Payer: Self-pay | Admitting: Family Medicine

## 2020-02-04 ENCOUNTER — Other Ambulatory Visit: Payer: Self-pay

## 2020-02-04 ENCOUNTER — Ambulatory Visit (INDEPENDENT_AMBULATORY_CARE_PROVIDER_SITE_OTHER): Payer: Medicare Other | Admitting: Family Medicine

## 2020-02-04 VITALS — BP 132/70 | HR 73 | Temp 97.2°F | Resp 18 | Ht 66.0 in | Wt 217.0 lb

## 2020-02-04 DIAGNOSIS — R030 Elevated blood-pressure reading, without diagnosis of hypertension: Secondary | ICD-10-CM

## 2020-02-04 DIAGNOSIS — H532 Diplopia: Secondary | ICD-10-CM | POA: Diagnosis not present

## 2020-02-04 NOTE — Progress Notes (Signed)
Subjective  CC:  Chief Complaint  Patient presents with  . Hypertension    Anxiety related.   . Diplopia    First episode was yesterday. No double vision today.     HPI: Tara Murphy is a 74 y.o. female who presents to the office today to address the problems listed above in the chief complaint.  I reviewed triage notes. Yesterday am, pt was working on computer when vision became blurry. She rubbed her eyes. When she looked up from computer, she saw two side by bookcases where she only has one book case. This lasted for about 30 seconds and then spontaneously vision returned to normal. She did not check to see if double vision was only binocular. She denied headache, vision loss, paresis, dysarthria but did admit she felt tired afterwards almost like when she had migraine equivalents in the past. Since, she has felt fine. She was evaluated by ophthalmology yesterday w/o abnormal findings outside of an elevated bp 160s/80 and very dry eyes to explain her blurred vision. She does not have a history of HTN.   ROS: neg cp, palpitations, loss of vision, sob, doe.   Assessment  1. Diplopia   2. Elevated blood pressure reading without diagnosis of hypertension      Plan   Diplopia, binolcular: no abnormalities on exam today or yesterday. No evidence of nerve palsy. Nl neuro exam. Nl BP. Suspect migraine equivalent or other. Will monitor. Brain MRI if worsens or recurs. Check labs  Normotensive today.  Follow up: recommend cpe. Overdue.   Visit date not found  Orders Placed This Encounter  Procedures  . TSH  . Sedimentation rate  . Comprehensive metabolic panel  . CBC with Differential/Platelet   No orders of the defined types were placed in this encounter.     I reviewed the patients updated PMH, FH, and SocHx.    Patient Active Problem List   Diagnosis Date Noted  . 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    Priority: High  . Familial hyperlipidemia 11/20/2017   Priority: High  . History of left breast cancer 03/20/2015    Priority: High  . Former smoker, stopped smoking in distant past 10/05/2019    Priority: Medium  . Osteopenia 09/07/2019    Priority: Medium  . IBS (irritable bowel syndrome) 10/08/2018    Priority: Medium  . GERD (gastroesophageal reflux disease) 11/20/2017    Priority: Medium  . Restless leg syndrome 11/20/2017    Priority: Medium  . OA (osteoarthritis) of knee 12/18/2015    Priority: Medium  . OA (osteoarthritis) of hip 12/18/2015    Priority: Medium  . Osteoarthritis of spine with radiculopathy, 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    Priority: Medium  . History of Clostridium difficile colitis 06/16/2015    Priority: Medium  . 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 Neuropathy, peripheral, idiopathic 06/13/2015    Priority: Medium  . Hammer toe 04/13/2018    Priority: Low  . Osteoarthritis of right foot 04/13/2018    Priority: Low  . 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 Essential tremor 09/26/2016    Priority: Low  . Chronic allergic rhinitis 06/13/2015    Priority: Low  . Recurrent urinary tract infection 06/13/2015    Priority: Low   Current Meds  Medication Sig  . busPIRone (BUSPAR) 7.5 MG tablet TAKE ONE TABLET BY MOUTH TWICE A DAY AS NEEDED (Patient  taking differently: as needed. )  . clobetasol (TEMOVATE) 0.05 % external solution Apply 1 application topically 2 (two) times daily as needed (for scalp inflammation).  . fluticasone (FLONASE) 50 MCG/ACT nasal spray Place 1 spray into the nose daily as needed for allergies or rhinitis.   . fluticasone (FLONASE) 50 MCG/ACT nasal spray Place 2 sprays into both nostrils daily.  Marland Kitchen gabapentin (NEURONTIN) 300 MG capsule TAKE TWO CAPSULES BY MOUTH TWO TIMES A DAY  . rOPINIRole (REQUIP) 4 MG tablet TAKE ONE TABLET BY MOUTH AT BEDTIME    Allergies: Patient is allergic to contrast media  [iodinated diagnostic agents]; hydromorphone hcl; other; sulfamethoxazole-trimethoprim; fentanyl; tape; and terbinafine and related. Family History: Patient family history includes Arthritis in her father and mother; Breast cancer (age of onset: 88) in her sister; Crohn's disease in her mother; Diabetes in her mother; Hyperlipidemia in her maternal aunt; Lung cancer in her father; Stomach cancer in her maternal grandmother. Social History:  Patient  reports that she quit smoking about 28 years ago. Her smoking use included cigarettes. She has never used smokeless tobacco. She reports current alcohol use of about 4.0 standard drinks of alcohol per week. She reports that she does not use drugs.  Review of Systems: Constitutional: Negative for fever malaise or anorexia Cardiovascular: negative for chest pain Respiratory: negative for SOB or persistent cough Gastrointestinal: negative for abdominal pain  Objective  Vitals: BP 132/70   Pulse 73   Temp (!) 97.2 F (36.2 C) (Temporal)   Resp 18   Ht 5\' 6"  (1.676 m)   Wt 217 lb (98.4 kg)   SpO2 94%   BMI 35.02 kg/m  General: no acute distress , A&Ox3 HEENT: PEERL, conjunctiva normal, neck is supple, PERRLA, EOMI, no carotid bruits Cardiovascular:  RRR without murmur or gallop.  Respiratory:  Good breath sounds bilaterally, CTAB with normal respiratory effort Skin:  Warm, no rashes Neuro: cr n 2-12 intact, non focal exam     Commons side effects, risks, benefits, and alternatives for medications and treatment plan prescribed today were discussed, and the patient expressed understanding of the given instructions. Patient is instructed to call or message via MyChart if he/she has any questions or concerns regarding our treatment plan. No barriers to understanding were identified. We discussed Red Flag symptoms and signs in detail. Patient expressed understanding regarding what to do in case of urgent or emergency type symptoms.   Medication  list was reconciled, printed and provided to the patient in AVS. Patient instructions and summary information was reviewed with the patient as documented in the AVS. This note was prepared with assistance of Dragon voice recognition software. Occasional wrong-word or sound-a-like substitutions may have occurred due to the inherent limitations of voice recognition software  This visit occurred during the SARS-CoV-2 public health emergency.  Safety protocols were in place, including screening questions prior to the visit, additional usage of staff PPE, and extensive cleaning of exam room while observing appropriate contact time as indicated for disinfecting solutions.

## 2020-02-04 NOTE — Telephone Encounter (Signed)
Patient is scheduled to see Dr. Jonni Sanger this afternoon at 3:00 pm

## 2020-02-04 NOTE — Patient Instructions (Signed)
Please return for cpe  I will release your lab results to you on your MyChart account with further instructions. Please reply with any questions.   Let me know if your symptoms recur.   If you have any questions or concerns, please don't hesitate to send me a message via MyChart or call the office at [phone]. Thank you for visiting with [name] today! It's our pleasure caring for you.   Diplopia Diplopia is a condition in which a person sees two of a single object. It is also called double vision. There are two types of diplopia.  Monocular diplopia. This is double vision that affects only one eye. Monocular diplopia is often caused by a clouding of the lens in your eye (cataract) or by a problem in the way your eye focuses light.  Binocular diplopia. This is double vision that affects both eyes. However, when you shut one eye, the double vision will go away. Binocular diplopia may be more serious. It can be caused by: ? Problems with the nerves or muscles that are responsible for eye movement. ? Disease of the nerves (neurologic disease). ? Immune system conditions, such as Graves' disease. ? Migraine headaches. ? Tumors. ? An infection. ? A stroke. ? An injury. There are many causes of diplopia. Some are not dangerous and can be easily corrected. Diplopia may also be a symptom of a serious medical problem. You may need to see a health care provider who specializes in eye conditions (ophthalmologist) or a nerve specialist (neurologist) to find the cause. Follow these instructions at home:   Pay attention to any changes in your vision. Tell your health care provider about them.  Do not drive or operate heavy machinery if diplopia interferes with your vision.  Keep all follow-up visits as told by your health care provider. This is important. Contact a health care provider if:  Your diplopia gets worse.  You develop any other symptoms along with your diplopia, such  as: ? Weakness. ? Numbness. ? Headache. ? Eye pain. ? Clumsiness. ? Nausea. ? Drooping eyelids. ? Abnormal movement of one eye. Get help right away if you:  Have sudden vision loss.  Suddenly get a very bad headache.  Have sudden weakness or numbness.  Suddenly lose the ability to speak, understand speech, or both. These symptoms may represent a serious problem that is an emergency. Do not wait to see if the symptoms will go away. Get medical help right away. Call your local emergency services (911 in the [place] Do not drive yourself to the hospital. Summary  Diplopia is a condition in which a person sees two of a single object. It is also called double vision.  Monocular diplopia is double vision that affects only one eye. It is often caused by a clouding of the lens in your eye (cataract) or by a problem in the way your eye focuses light.  Binocular diplopia is double vision that affects both eyes. However, when you shut one eye, the double vision will go away. Binocular diplopia may be more serious.  If you have diplopia, you may need to see a health care provider who specializes in eye conditions (ophthalmologist) or a nerve specialist (neurologist) to find the cause. This information is not intended to replace advice given to you by your health care provider. Make sure you discuss any questions you have with your health care provider. Document Revised: 09/03/2017 Document Reviewed: 09/03/2017 Elsevier Patient Education  2020 [employer].

## 2020-02-05 LAB — COMPREHENSIVE METABOLIC PANEL
AG Ratio: 2 (calc) (ref 1.0–2.5)
ALT: 43 U/L — ABNORMAL HIGH (ref 6–29)
AST: 26 U/L (ref 10–35)
Albumin: 4.4 g/dL (ref 3.6–5.1)
Alkaline phosphatase (APISO): 94 U/L (ref 37–153)
BUN: 13 mg/dL (ref 7–25)
CO2: 25 mmol/L (ref 20–32)
Calcium: 9.4 mg/dL (ref 8.6–10.4)
Chloride: 106 mmol/L (ref 98–110)
Creat: 0.6 mg/dL (ref 0.60–0.93)
Globulin: 2.2 g/dL (calc) (ref 1.9–3.7)
Glucose, Bld: 95 mg/dL (ref 65–99)
Potassium: 4.3 mmol/L (ref 3.5–5.3)
Sodium: 140 mmol/L (ref 135–146)
Total Bilirubin: 0.3 mg/dL (ref 0.2–1.2)
Total Protein: 6.6 g/dL (ref 6.1–8.1)

## 2020-02-05 LAB — CBC WITH DIFFERENTIAL/PLATELET
Absolute Monocytes: 440 cells/uL (ref 200–950)
Basophils Absolute: 42 cells/uL (ref 0–200)
Basophils Relative: 0.5 %
Eosinophils Absolute: 66 cells/uL (ref 15–500)
Eosinophils Relative: 0.8 %
HCT: 39.9 % (ref 35.0–45.0)
Hemoglobin: 13.4 g/dL (ref 11.7–15.5)
Lymphs Abs: 2291 cells/uL (ref 850–3900)
MCH: 30.6 pg (ref 27.0–33.0)
MCHC: 33.6 g/dL (ref 32.0–36.0)
MCV: 91.1 fL (ref 80.0–100.0)
MPV: 9.8 fL (ref 7.5–12.5)
Monocytes Relative: 5.3 %
Neutro Abs: 5461 cells/uL (ref 1500–7800)
Neutrophils Relative %: 65.8 %
Platelets: 322 10*3/uL (ref 140–400)
RBC: 4.38 10*6/uL (ref 3.80–5.10)
RDW: 12.3 % (ref 11.0–15.0)
Total Lymphocyte: 27.6 %
WBC: 8.3 10*3/uL (ref 3.8–10.8)

## 2020-02-05 LAB — SEDIMENTATION RATE: Sed Rate: 11 mm/h (ref 0–30)

## 2020-02-05 LAB — TSH: TSH: 1.89 mIU/L (ref 0.40–4.50)

## 2020-03-01 ENCOUNTER — Other Ambulatory Visit: Payer: Self-pay | Admitting: Family Medicine

## 2020-04-14 DIAGNOSIS — L661 Lichen planopilaris: Secondary | ICD-10-CM | POA: Diagnosis not present

## 2020-04-14 DIAGNOSIS — L820 Inflamed seborrheic keratosis: Secondary | ICD-10-CM | POA: Diagnosis not present

## 2020-04-14 DIAGNOSIS — L570 Actinic keratosis: Secondary | ICD-10-CM | POA: Diagnosis not present

## 2020-04-23 DIAGNOSIS — R3915 Urgency of urination: Secondary | ICD-10-CM | POA: Diagnosis not present

## 2020-05-24 ENCOUNTER — Encounter: Payer: Medicare Other | Admitting: Family Medicine

## 2020-06-07 ENCOUNTER — Ambulatory Visit (INDEPENDENT_AMBULATORY_CARE_PROVIDER_SITE_OTHER): Payer: Medicare Other | Admitting: Family Medicine

## 2020-06-07 ENCOUNTER — Other Ambulatory Visit: Payer: Self-pay

## 2020-06-07 ENCOUNTER — Encounter: Payer: Self-pay | Admitting: Family Medicine

## 2020-06-07 VITALS — BP 140/82 | HR 82 | Temp 98.2°F | Wt 216.6 lb

## 2020-06-07 DIAGNOSIS — D126 Benign neoplasm of colon, unspecified: Secondary | ICD-10-CM | POA: Diagnosis not present

## 2020-06-07 DIAGNOSIS — E7849 Other hyperlipidemia: Secondary | ICD-10-CM

## 2020-06-07 DIAGNOSIS — K580 Irritable bowel syndrome with diarrhea: Secondary | ICD-10-CM

## 2020-06-07 DIAGNOSIS — L989 Disorder of the skin and subcutaneous tissue, unspecified: Secondary | ICD-10-CM

## 2020-06-07 DIAGNOSIS — G2581 Restless legs syndrome: Secondary | ICD-10-CM

## 2020-06-07 DIAGNOSIS — L239 Allergic contact dermatitis, unspecified cause: Secondary | ICD-10-CM

## 2020-06-07 DIAGNOSIS — Z23 Encounter for immunization: Secondary | ICD-10-CM | POA: Diagnosis not present

## 2020-06-07 DIAGNOSIS — G609 Hereditary and idiopathic neuropathy, unspecified: Secondary | ICD-10-CM

## 2020-06-07 DIAGNOSIS — K219 Gastro-esophageal reflux disease without esophagitis: Secondary | ICD-10-CM

## 2020-06-07 MED ORDER — ATORVASTATIN CALCIUM 10 MG PO TABS: 10.0000 mg | ORAL_TABLET | ORAL | 3 refills | Status: DC

## 2020-06-07 MED ORDER — ROPINIROLE HCL 4 MG PO TABS
4.0000 mg | ORAL_TABLET | Freq: Every day | ORAL | 3 refills | Status: DC
Start: 2020-06-07 — End: 2021-05-03

## 2020-06-07 MED ORDER — PREGABALIN 75 MG PO CAPS: 75.0000 mg | ORAL_CAPSULE | Freq: Two times a day (BID) | ORAL | 11 refills | Status: DC

## 2020-06-07 NOTE — Patient Instructions (Signed)
Please return in 6 months for recheck.  I will release your lab results to you on your MyChart account with further instructions. Please reply with any questions.   Please contact your gastroenterologist to schedule your colonoscopy and follow up on your IBS. Please contact your dermatologist to get your skin lesion looked at. We will call you with information regarding your referral appointment. Neurology to further address your neuropathy and RLS.  If you do not hear from [name] within the next 2 weeks, please let me know. It can take 1-2 weeks to get appointments set up with the specialists.   I have ordered lyrica to take to see if helps more with the neuropathy symptoms.  Today you were given your flu vaccination.   If you have any questions or concerns, please don't hesitate to send me a message via MyChart or call the office at [phone]. Thank you for visiting with [name] today! It's our pleasure caring for you.

## 2020-06-07 NOTE — Progress Notes (Signed)
Subjective  Chief Complaint  Patient presents with  . Annual Exam    [name] 01/10/20 with Tara Murphy  . Cyst    small bump located on left side of nose - present for 9 months  . Peripheral Neuropathy    HPI: Tara Murphy is a 74 y.o. female who presents to [place] [facility] [place] today for a Female Wellness Visit. She also has the concerns and/or needs as listed above in the chief complaint. These will be addressed in addition to the Health Maintenance Visit.   Wellness Visit: annual visit with health maintenance review and exam without Pap   HM: screens are up to date. Flu vaccine today. Chronic disease f/u and/or acute problem visit: (deemed necessary to be done in addition to the wellness visit):  Hyperlipidemia: She stopped her statin a while back for unclear reasons.  She says she thinks it was because it made her not feel good.  She cannot be more specific.  Most recent colonoscopy with tubulovillous adenoma and due for 1 year recall, this was postponed last year due to Covid.  She needs to have this done.  No melena, weight loss or abdominal pain.  GERD with IBS and diarrhea are currently active.  Struggles with chronic GERD in spite of over-the-counter medications.  She has been evaluated by GI.  Main concerns are worsening peripheral neuropathy: She reports this is not worked up in the past.  She has been treated with gabapentin and had been well controlled but now nightly awakenings due to burning pain numbness and tingling.  No foot sores.  Restless leg syndrome continues to worsen as well.  Sleep is becoming a significant problem.  She is on ropinirole 4 mg nightly.  This used to work well for her.  Complains of red nonhealing lesion on the tip of her nose has been there for over 6 months.  Intermittently bleeds.  She does have a dermatologist.  She was getting bitten by either mosquitoes or other insects and will have small red painful burning bumps.  She  wants to know if she can take prednisone for these.  Assessment  1. Familial hyperlipidemia   2. Tubulovillous adenoma of colon   3. Gastroesophageal reflux disease without esophagitis   4. Irritable bowel syndrome with diarrhea   5. Neuropathy, peripheral, idiopathic   6. Restless leg syndrome   7. Need for immunization against influenza   8. Skin lesion   9. Allergic dermatitis      Plan  Female Wellness Visit:  Age appropriate Health Maintenance and Prevention measures were discussed with patient. Included topics are cancer screening recommendations, ways to keep healthy (see AVS) including dietary and exercise recommendations, regular eye and dental care, use of seat belts, and avoidance of moderate alcohol use and tobacco use.  Recommend contacting GI to get her colonoscopy scheduled.  BMI: discussed patient's BMI and encouraged positive lifestyle modifications to help get to or maintain a target BMI.  HM needs and immunizations were addressed and ordered. See below for orders. See HM and immunization section for updates.  Flu shot today  Routine labs and screening tests ordered including cmp, cbc and lipids where appropriate.  Discussed recommendations regarding Vit D and calcium supplementation (see AVS)  Chronic disease management visit and/or acute problem visit:  GERD and irritable bowel syndrome and need for colonoscopy: Patient will contact gastroenterology.  She has tried multiple diets and medications without success.  Recommend referral to neurology for further  evaluation of her neuropathy and will change to Lyrica to see if this helps her better.  Also will see if neurology can better manage her restless leg syndrome.  Check a CBC to rule out anemia.  And iron studies if anemic.  Hyperlipidemia: Counseled on recommendations and indications for statin.  She will retry Lipitor.  Recommend starting 3 times weekly.  Reordered.  Check lab work today.  Recommend  dermatology appointment to biopsy the skin lesion.  Patient will call make an appointment.  Allergic skin reactions: Reassured.  Topical Benadryl, over-the-counter Zyrtec and/or Pepcid as needed.  Avoid steroids  Follow up: 6 months for recheck Orders Placed This Encounter  Procedures  . Flu Vaccine QUAD High Dose(Fluad)  . CBC with Differential/Platelet  . COMPLETE METABOLIC PANEL WITH GFR  . Lipid panel  . TSH  . Ambulatory referral to Neurology   Meds ordered this encounter  Medications  . pregabalin (LYRICA) 75 MG capsule    Sig: Take 1 capsule (75 mg total) by mouth 2 (two) times daily.    Dispense:  60 capsule    Refill:  11  . atorvastatin (LIPITOR) 10 MG tablet    Sig: Take 1 tablet (10 mg total) by mouth 3 (three) times a week.    Dispense:  36 tablet    Refill:  3  . rOPINIRole (REQUIP) 4 MG tablet    Sig: Take 1 tablet (4 mg total) by mouth at bedtime.    Dispense:  90 tablet    Refill:  3      Lifestyle: Body mass index is 34.96 kg/m. Wt Readings from Last 3 Encounters:  06/07/20 216 lb 9.6 oz (98.2 kg)  02/04/20 217 lb (98.4 kg)  01/10/20 213 lb (96.6 kg)   Patient Active Problem List   Diagnosis Date Noted  . 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    Priority: High    Colonoscopy 06/2018; serrated sessile polyp as well. Repeat 2020   . Familial hyperlipidemia 11/20/2017    Priority: High  . History of left breast cancer 03/20/2015    Priority: High    Overview:  Last Assessment & Plan:  Left breast invasive ductal carcinoma diagnosed and treated in [place] diagnosed October 2014 status post lumpectomy January 2015 1.1 cm IDC grade 1 T1 cN0 M0 stage IA, 0/2 lymph nodes, ER/PR positive HER-2 negative status post MammoSite radiation started anastrozole 1 mg daily March 2015  Anastrozole toxicities: 1. Severe emotional problems requiring Zoloft, Ambien, clonazepam which she attributes partially to the medication as well as stool her  mother passing away last year along with moving to [place]. She is quite or rebound. 2. Skin dryness 3. Lack of feeling of wellness  Plan: 1. Treatment break from anastrozole for 3 months 2. When she returns if her symptoms haven't gotten better than we can associate those for anastrozole and we can plan on changing therapy to another aromatase inhibitor versus using tamoxifen.  Return to clinic in 3 months to assess benefits of treatment break and follow-up.   . Former smoker, stopped smoking in distant past 10/05/2019    Priority: Medium    Quit 2000; 30 y smoking history   . Osteopenia 09/07/2019    Priority: Medium    Dexa 08/2019: T = -1.2 at wrist; back and hips excluded (DJD and hardware).    . IBS (irritable bowel syndrome) 10/08/2018    Priority: Medium    Colonoscopy and EGD 09/2018   . GERD (gastroesophageal  reflux disease) 11/20/2017    Priority: Medium  . Restless leg syndrome 11/20/2017    Priority: Medium  . OA (osteoarthritis) of knee 12/18/2015    Priority: Medium  . OA (osteoarthritis) of hip 12/18/2015    Priority: Medium  . Osteoarthritis of spine with radiculopathy, 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    Priority: Medium  . History of Clostridium difficile colitis 06/16/2015    Priority: Medium  . 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 Neuropathy, peripheral, idiopathic 06/13/2015    Priority: Medium    Overview:  Uses gabapentin   . Hammer toe 04/13/2018    Priority: Low  . Osteoarthritis of right foot 04/13/2018    Priority: Low  . 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 Essential tremor 09/26/2016    Priority: Low  . Chronic allergic rhinitis 06/13/2015    Priority: Low  . Recurrent urinary tract infection 06/13/2015    Priority: Low   Health Maintenance  Topic Date Due  . COLONOSCOPY  01/09/2021 (Originally  07/03/2019)  . MAMMOGRAM  09/05/2020  . DEXA SCAN  09/05/2021  . TETANUS/TDAP  06/23/2022  . INFLUENZA VACCINE  Completed  . COVID-19 Vaccine  Completed  . Hepatitis C Screening  Completed  . PNA vac Low Risk Adult  Completed   Immunization History  Administered Date(s) Administered  . Fluad Quad(high Dose 65+) 05/13/2019, 06/07/2020  . Influenza Split 07/18/2011, 07/21/2012, 06/16/2013, 08/15/2014  . Influenza, High Dose Seasonal PF 06/13/2015, 09/26/2016, 10/02/2017  . Influenza, Quadrivalent, Recombinant, Inj, Pf 10/02/2017  . Influenza,inj,Quad PF,6+ Mos 10/02/2017, 07/06/2018  . Influenza,inj,quad, With Preservative 06/23/2017  . PFIZER SARS-COV-2 Vaccination 10/14/2019, 11/03/2019  . Pneumococcal Conjugate-13 08/15/2014  . Pneumococcal Polysaccharide-23 06/16/2013  . Tdap 06/23/2012  . Zoster 01/25/2011  . Zoster Recombinat (Shingrix) 10/26/2018, 03/09/2019   We updated and reviewed the patient's past history in detail and it is documented below. Allergies: Patient is allergic to contrast media [iodinated diagnostic agents], hydromorphone hcl, other, sulfamethoxazole-trimethoprim, fentanyl, tape, and terbinafine and related. Past Medical History Patient  has a past medical history of Allergy, Arthritis, 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[place]), Breast cancer ([place]) (2015), Cataract, Cholecystolithiasis, Chronic allergic rhinitis, Depression, Former smoker, stopped smoking in distant past (10/05/2019), GERD (gastroesophageal reflux disease), Hyperlipemia, IBS (irritable bowel syndrome) (2/59/5638), Lichen plano-pilaris, Major depression, chronic (09/05/2015), Neuropathy, peripheral, idiopathic, Osteopenia (09/07/2019), Personal history of radiation therapy (2015), Tubulovillous adenoma of colon (10/08/2018), and Urinary tract infection. Past Surgical History Patient  has a past surgical history that includes Appendectomy; Total hip arthroplasty; Total knee arthroplasty; Tubal ligation;  cataracts; Liposuction; Total hip arthroplasty (Right, 12/18/2015); Cholecystectomy (N/A, 10/29/2016); Total knee arthroplasty (Left, 11/25/2016); and Breast lumpectomy (Left, 2015). Family History: Patient family history includes Arthritis in her father and mother; Breast cancer (age of onset: 58) in her sister; Crohn's disease in her mother; Diabetes in her mother; Hyperlipidemia in her maternal aunt; Lung cancer in her father; Stomach cancer in her maternal grandmother. Social History:  Patient  reports that she quit smoking about 28 years ago. Her smoking use included cigarettes. She smoked 1.00 pack per day. She has never used smokeless tobacco. She reports current alcohol use of about 4.0 standard drinks of alcohol per week. She reports that she does not use drugs.  Review of Systems: Constitutional: negative for fever or malaise Ophthalmic: negative for photophobia, double  vision or loss of vision Cardiovascular: negative for chest pain, dyspnea on exertion, or new LE swelling Respiratory: negative for SOB or persistent cough Gastrointestinal: negative for abdominal pain, change in bowel habits or melena Genitourinary: negative for dysuria or gross hematuria, no abnormal uterine bleeding or disharge Musculoskeletal: negative for new gait disturbance or muscular weakness Integumentary: negative for new or persistent rashes, no breast lumps Neurological: negative for TIA or stroke symptoms Psychiatric: negative for SI or delusions Allergic/Immunologic: negative for hives  Patient Care Team    Relationship Specialty Notifications Start End  Leamon Arnt, MD PCP - General Family Medicine  01/23/18   Doran Stabler, MD Consulting Physician Gastroenterology  01/10/20   Ulla Gallo, MD Consulting Physician Dermatology  01/10/20   Wylene Simmer, MD Consulting Physician Orthopedic Surgery  01/10/20     Objective  Vitals: BP 140/82   Pulse 82   Temp 98.2 F (36.8 C) (Temporal)   Wt 216  lb 9.6 oz (98.2 kg)   SpO2 96%   BMI 34.96 kg/m  General:  Well developed, well nourished, no acute distress  Psych:  Alert and orientedx3,normal mood and affect HEENT:  Normocephalic, atraumatic, non-icteric sclera,  supple neck without adenopathy, mass or thyromegaly Cardiovascular:  Normal S1, S2, RRR without gallop, rub slight systolic murmur present Respiratory:  Good breath sounds bilaterally, CTAB with normal respiratory effort Gastrointestinal: normal bowel sounds, soft, non-tender, no noted masses. No HSM MSK: no deformities, contusions. Joints are without erythema or swelling.  Skin:  Warm, red nonhealing lesion 3 to 4 mm on tip of nose, several mosquito bites on chest wall and left arm Neurologic:    Mental status is normal. CN 2-11 are normal. Gross motor and sensory exams are normal. Normal gait. No tremor    Commons side effects, risks, benefits, and alternatives for medications and treatment plan prescribed today were discussed, and the patient expressed understanding of the given instructions. Patient is instructed to call or message via MyChart if he/she has any questions or concerns regarding our treatment plan. No barriers to understanding were identified. We discussed Red Flag symptoms and signs in detail. Patient expressed understanding regarding what to do in case of urgent or emergency type symptoms.   Medication list was reconciled, printed and provided to the patient in AVS. Patient instructions and summary information was reviewed with the patient as documented in the AVS. This note was prepared with assistance of Dragon voice recognition software. Occasional wrong-word or sound-a-like substitutions may have occurred due to the inherent limitations of voice recognition software  This visit occurred during the SARS-CoV-2 public health emergency.  Safety protocols were in place, including screening questions prior to the visit, additional usage of staff PPE, and extensive  cleaning of exam room while observing appropriate contact time as indicated for disinfecting solutions.

## 2020-06-08 ENCOUNTER — Encounter: Payer: Self-pay | Admitting: Family Medicine

## 2020-06-08 DIAGNOSIS — K760 Fatty (change of) liver, not elsewhere classified: Secondary | ICD-10-CM | POA: Insufficient documentation

## 2020-06-08 HISTORY — DX: Fatty (change of) liver, not elsewhere classified: K76.0

## 2020-06-08 LAB — COMPLETE METABOLIC PANEL WITH GFR
AG Ratio: 1.7 (calc) (ref 1.0–2.5)
ALT: 38 U/L — ABNORMAL HIGH (ref 6–29)
AST: 38 U/L — ABNORMAL HIGH (ref 10–35)
Albumin: 4.3 g/dL (ref 3.6–5.1)
Alkaline phosphatase (APISO): 91 U/L (ref 37–153)
BUN: 15 mg/dL (ref 7–25)
CO2: 26 mmol/L (ref 20–32)
Calcium: 9.3 mg/dL (ref 8.6–10.4)
Chloride: 106 mmol/L (ref 98–110)
Creat: 0.69 mg/dL (ref 0.60–0.93)
GFR, Est African American: 100 mL/min/{1.73_m2} (ref >=60)
GFR, Est Non African American: 86 mL/min/{1.73_m2} (ref >=60)
Globulin: 2.5 g/dL (calc) (ref 1.9–3.7)
Glucose, Bld: 89 mg/dL (ref 65–99)
Potassium: 5.4 mmol/L — ABNORMAL HIGH (ref 3.5–5.3)
Sodium: 140 mmol/L (ref 135–146)
Total Bilirubin: 0.3 mg/dL (ref 0.2–1.2)
Total Protein: 6.8 g/dL (ref 6.1–8.1)

## 2020-06-08 LAB — CBC WITH DIFFERENTIAL/PLATELET
Absolute Monocytes: 429 cells/uL (ref 200–950)
Basophils Absolute: 40 cells/uL (ref 0–200)
Basophils Relative: 0.6 %
Eosinophils Absolute: 73 cells/uL (ref 15–500)
Eosinophils Relative: 1.1 %
HCT: 41.5 % (ref 35.0–45.0)
Hemoglobin: 13.7 g/dL (ref 11.7–15.5)
Lymphs Abs: 1861 cells/uL (ref 850–3900)
MCH: 30.1 pg (ref 27.0–33.0)
MCHC: 33 g/dL (ref 32.0–36.0)
MCV: 91.2 fL (ref 80.0–100.0)
MPV: 9.7 fL (ref 7.5–12.5)
Monocytes Relative: 6.5 %
Neutro Abs: 4198 cells/uL (ref 1500–7800)
Neutrophils Relative %: 63.6 %
Platelets: 350 10*3/uL (ref 140–400)
RBC: 4.55 10*6/uL (ref 3.80–5.10)
RDW: 12 % (ref 11.0–15.0)
Total Lymphocyte: 28.2 %
WBC: 6.6 10*3/uL (ref 3.8–10.8)

## 2020-06-08 LAB — LIPID PANEL
Cholesterol: 278 mg/dL — ABNORMAL HIGH (ref <200)
HDL: 63 mg/dL (ref >=50)
LDL Cholesterol (Calc): 182 mg/dL (calc) — ABNORMAL HIGH
Non-HDL Cholesterol (Calc): 215 mg/dL (calc) — ABNORMAL HIGH (ref <130)
Total CHOL/HDL Ratio: 4.4 (calc) (ref <5.0)
Triglycerides: 176 mg/dL — ABNORMAL HIGH (ref <150)

## 2020-06-08 LAB — TSH: TSH: 1.92 mIU/L (ref 0.40–4.50)

## 2020-06-09 ENCOUNTER — Encounter: Payer: Self-pay | Admitting: Neurology

## 2020-06-19 ENCOUNTER — Other Ambulatory Visit: Payer: Self-pay

## 2020-06-19 ENCOUNTER — Encounter: Payer: Self-pay | Admitting: Neurology

## 2020-06-19 ENCOUNTER — Ambulatory Visit (INDEPENDENT_AMBULATORY_CARE_PROVIDER_SITE_OTHER): Payer: Medicare Other | Admitting: Neurology

## 2020-06-19 VITALS — BP 148/76 | HR 81 | Ht 66.0 in | Wt 216.0 lb

## 2020-06-19 DIAGNOSIS — G609 Hereditary and idiopathic neuropathy, unspecified: Secondary | ICD-10-CM

## 2020-06-19 DIAGNOSIS — E639 Nutritional deficiency, unspecified: Secondary | ICD-10-CM

## 2020-06-19 NOTE — Patient Instructions (Addendum)
Reduce alcohol intake  Check labs  Continue medications as you are taking  Check your feet daily, take extra caution on uneven ground  Return to clinic in 6 months, or sooner as needed

## 2020-06-19 NOTE — Progress Notes (Signed)
[facility] Clinic Note - Initial Visit   Date: 06/19/20  Tara Murphy MRN: [id number] DOB: 1946-09-16   Dear Dr. Jonni Sanger:  Thank you for your kind referral of [facility] for consultation of RLS and neuropathy. Although her history is well known to you, please allow [name] to reiterate it for the purpose of our medical record. The patient was accompanied to the clinic by self.    History of Present Illness: Tara Murphy is a delightful 74 y.o. right-handed female with left breast cancer s/p lumpectomy, GERD, depression, former smoker, hyperlipidemia, and IBS presenting for evaluation of neuropathy and RLS.   Starting around 2018, she has numbness in the balls of the feet and toes.  She has imbalance, worse on uneven ground and tends to hold onto things when she is putting on her pants or walking on unsteady ground.  She walks unassisted and has not suffered any falls.  She is not diabetic. She drinks 2 glasses of wine nightly for the past 8 years.   She also has RLS, described as restless sensation over the lower legs, which is triggered by resting.  She finds herself having to move her legs.  She was taking gabapentin 1200mg  at bedtime which helped.  If she had daytime symptoms, she would take gabapentin 300mg  in the morning, but would not be able to function during the day because of cognitive side effects.  She also takes ropinirole 4mg  at bedtime.   At her last visit, she started Lyrica 150mg  at bedtime and seems to control her nighttime symptoms much better better.    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   Out-side paper records, electronic medical record, and images have been reviewed where available and summarized as:  Lab Results  Component Value Date   TSH 1.92 06/07/2020   Lab Results  Component Value Date   ESRSEDRATE 11 02/04/2020    Past Medical  History:  Diagnosis Date  . Allergy   . Arthritis   . 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[facility])    S/p total hip replacement left  . Breast cancer ([place]) 2015   left   . Cataract   . Cholecystolithiasis   . Chronic allergic rhinitis   . Depression   . Former smoker, stopped smoking in distant past 10/05/2019   Quit 2000; 52 y smoking history  . GERD (gastroesophageal reflux disease)   . Hepatic steatosis 06/08/2020   Intermittent elevated LFTs and ultrasound 2018  . Hyperlipemia   . IBS (irritable bowel syndrome) 10/08/2018   Nl colonoscopy and EGD 09/2018  . Lichen plano-pilaris   . 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 Neuropathy, peripheral, idiopathic    uses gabapentin  . Osteopenia 09/07/2019   Dexa 08/2019: T = -1.2 at wrist; back and hips excluded (DJD and hardware).   . Personal history of radiation therapy 2015  . 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 Repeat 2020  . Urinary tract infection    hx of frequent     Past Surgical History:  Procedure Laterality Date  . APPENDECTOMY    . BREAST LUMPECTOMY Left 2015  . cataracts    . CHOLECYSTECTOMY N/A 10/29/2016   Procedure: LAPAROSCOPIC CHOLECYSTECTOMY;  Surgeon: Erroll Luna, MD;  Location: [facility];  Service: General;  Laterality: N/A;  . LIPOSUCTION    . TOTAL HIP ARTHROPLASTY  bilateral hip arthoplasty  . TOTAL HIP ARTHROPLASTY Right 12/18/2015   Procedure: RIGHT TOTAL HIP ARTHROPLASTY ANTERIOR APPROACH;  Surgeon: Gaynelle Arabian, MD;  Location: [facility];  Service: Orthopedics;  Laterality: Right;  . TOTAL KNEE ARTHROPLASTY    . TOTAL KNEE ARTHROPLASTY Left 11/25/2016   Procedure: LEFT TOTAL KNEE ARTHROPLASTY;  Surgeon: Gaynelle Arabian, MD;  Location: [facility];  Service: Orthopedics;  Laterality: Left;  with abductor block  . TUBAL LIGATION       Medications:  Outpatient Encounter Medications as of 06/19/2020  Medication Sig  . atorvastatin (LIPITOR) 10 MG tablet  Take 1 tablet (10 mg total) by mouth 3 (three) times a week.  . busPIRone (BUSPAR) 7.5 MG tablet TAKE ONE TABLET BY MOUTH TWICE A DAY AS NEEDED (Patient taking differently: as needed. )  . celecoxib (CELEBREX) 200 MG capsule Take 200 mg by mouth daily.  . clobetasol (TEMOVATE) 0.05 % external solution APPLY TO AFFECTED AREA(S) TOPICALLY TWO TIMES A DAY AS NEEDED FOR SCALP INFLAMMATION  . fluticasone (FLONASE) 50 MCG/ACT nasal spray Place 2 sprays into both nostrils daily.  . pregabalin (LYRICA) 75 MG capsule Take 1 capsule (75 mg total) by mouth 2 (two) times daily.  Marland Kitchen rOPINIRole (REQUIP) 4 MG tablet Take 1 tablet (4 mg total) by mouth at bedtime.  . traMADol (ULTRAM) 50 MG tablet Take 1 tablet (50 mg total) by mouth daily as needed.   No facility-administered encounter medications on file as of 06/19/2020.    Allergies:  Allergies  Allergen Reactions  . Contrast Media [Iodinated Diagnostic Agents] Hives  . Hydromorphone Hcl Nausea Only  . Other Rash    Certain Band-Aids don't agree with the patient's skin; hospital ID wristband causes rashes  . Sulfamethoxazole-Trimethoprim Hives  . Fentanyl Other (See Comments)    Redness and flushing over body   . Tape Rash    Certain Band-Aids don't agree with the patient's skin; hospital ID wristband causes rashes  . Terbinafine And Related Hives and Rash    Family History: Family History  Problem Relation Age of Onset  . Arthritis Mother   . Diabetes Mother   . Crohn's disease Mother   . Arthritis Father   . Lung cancer Father   . Breast cancer Sister 4  . Hyperlipidemia Maternal Aunt   . Stomach cancer Maternal Grandmother   . Colitis Neg Hx   . Rectal cancer Neg Hx   . Esophageal cancer Neg Hx     Social History: Social History   Tobacco Use  . Smoking status: Former Smoker    Packs/day: 1.00    Types: Cigarettes    Quit date: 11/22/1991    Years since quitting: 28.5  . Smokeless tobacco: Never Used  Vaping Use  . Vaping  Use: Never used  Substance Use Topics  . Alcohol use: Yes    Alcohol/week: 4.0 standard drinks    Types: 4 Glasses of wine per week  . Drug use: No   Social History   Social History Narrative   4 grandchildren and 3 step    Enjoys painting, and gardening, and home decorating    Right Handed   Has a Radiation protection practitioner    Lives one story home     Vital Signs:  BP (!) 148/76   Pulse 81   Ht 5\' 6"  (1.676 m)   Wt 216 lb (98 kg)   SpO2 96%   BMI 34.86 kg/m   Neurological Exam: MENTAL STATUS including orientation to  time, place, person, recent and remote memory, attention span and concentration, language, and fund of knowledge is normal.  Speech is not dysarthric.  CRANIAL NERVES: II:  No visual field defects.   III-IV-VI: Pupils equal round and reactive.  Normal conjugate, extra-ocular eye movements in all directions of gaze.  No nystagmus.  No ptosis.   V:  Normal facial sensation.    VII:  Normal facial symmetry and movements.   VIII:  Normal hearing and vestibular function.   IX-X:  Normal palatal movement.   XI:  Normal shoulder shrug and head rotation.   XII:  Normal tongue strength and range of motion, no deviation or fasciculation.  MOTOR:  No atrophy, fasciculations or abnormal movements.  No pronator drift.   Upper Extremity:  Right  Left  Deltoid  5/5   5/5   Biceps  5/5   5/5   Triceps  5/5   5/5   Infraspinatus 5/5  5/5  Medial pectoralis 5/5  5/5  Wrist extensors  5/5   5/5   Wrist flexors  5/5   5/5   Finger extensors  5/5   5/5   Finger flexors  5/5   5/5   Dorsal interossei  5/5   5/5   Abductor pollicis  5/5   5/5   Tone (Ashworth scale)  0  0   Lower Extremity:  Right  Left  Hip flexors  5/5   5/5   Hip extensors  5/5   5/5   Adductor 5/5  5/5  Abductor 5/5  5/5  Knee flexors  5/5   5/5   Knee extensors  5/5   5/5   Dorsiflexors  5/5   5/5   Plantarflexors  5/5   5/5   Toe extensors  5/5   5/5   Toe flexors  5/5   5/5   Tone (Ashworth scale)  0   0   MSRs:  Right        Left                  brachioradialis 2+  2+  biceps 2+  2+  triceps 2+  2+  patellar 2+  2+  ankle jerk 0  0  Hoffman no  no  plantar response down  down   SENSORY:  Trace vibration at the great toe bilaterally, intact at the ankles.  Temperature and pin prick minimally reduced at the distal feet.  Rhomberg sign is present.  COORDINATION/GAIT: Normal finger-to- nose-finger.  Intact rapid alternating movements bilaterally.  Gait narrow based and stable. Unsteady with stressed and tandem gait, but able to perform.    IMPRESSION: 1.  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  Will also screen for nutritional deficiency 2.  Restless leg syndrome  PLAN/RECOMMENDATIONS:  Check vitamin B12, vitamin B1, folate Continue Lyrica 150mg  at bedtime Continue ropinirole 4mg  at bedtime Patient educated on daily foot inspection, fall prevention, and safety precautions around the home.  Return to clinic in 6 months months.   Thank you for allowing me to participate in patient's care.  If I can answer any additional questions, I would be pleased to do so.    Sincerely,    Jaasia Viglione K. Posey Pronto, DO

## 2020-07-14 ENCOUNTER — Encounter: Payer: Self-pay | Admitting: Family Medicine

## 2020-08-24 DIAGNOSIS — Z23 Encounter for immunization: Secondary | ICD-10-CM | POA: Diagnosis not present

## 2020-08-29 ENCOUNTER — Other Ambulatory Visit: Payer: Self-pay | Admitting: Family Medicine

## 2020-09-01 ENCOUNTER — Other Ambulatory Visit: Payer: Self-pay | Admitting: Family Medicine

## 2020-10-11 ENCOUNTER — Telehealth: Payer: Self-pay

## 2020-10-11 NOTE — Telephone Encounter (Signed)
Please call her ... does she want a virtual appt. This doesn't seem like it needs ER.  Tara Murphy, The triage seems to always send to urgent care or ER? ER for dizziness, recurrent during covid surge??

## 2020-10-11 NOTE — Telephone Encounter (Signed)
[facility] offering virtual visit to patient

## 2020-10-11 NOTE — Telephone Encounter (Signed)
Nurse Assessment Nurse: Raphael Gibney, RN, Vanita Ingles Date/Time (Eastern Time): 10/11/2020 10:52:58 AM Confirm and document reason for call. If symptomatic, describe symptoms. ---Caller states she has had vertigo for 3 days. Vertigo was severe on Monday. she vomited Monday am. has had vertigo before. COVID test was negative x 2. Does the patient have any new or worsening symptoms? ---Yes Will a triage be completed? ---Yes Related visit to physician within the last 2 weeks? ---No Does the PT have any chronic conditions? (i.e. diabetes, asthma, this includes High risk factors for pregnancy, etc.) ---Yes List chronic conditions. ---neuropathy; hip and knee replacement; arthritis Is this a behavioral health or substance abuse call? ---No Guidelines Guideline Title Affirmed Question Affirmed Notes Nurse Date/Time (Eastern Time) Dizziness - Vertigo [1] Dizziness (vertigo) present now AND [2] age > 27 (Exception: prior physician evaluation for this AND no different/ worse than usual) Raphael Gibney, RN, Vanita Ingles 10/11/2020 10:57:01 AM Disp. Time Eilene Ghazi Time) Disposition Final User 10/11/2020 11:03:09 AM Go to ED Now (or PCP triage) Yes Raphael Gibney, RN, Vera PLEASE NOTE: All timestamps contained within this report are represented as [place] Standard Time. CONFIDENTIALTY NOTICE: This fax transmission is intended only for the addressee. It contains information that is legally privileged, confidential or otherwise protected from use or disclosure. If you are not the intended recipient, you are strictly prohibited from reviewing, disclosing, copying using or disseminating any of this information or taking any action in reliance on or regarding this information. If you have received this fax in error, please notify us immediately by telephone so that we can arrange for its return to [name]. Phone: [phone], Toll-Free: [phone], Fax: [phone] Page: 2 of 2 Call Id: [id number] [place] Disagree/Comply Disagree Caller  Understands Yes PreDisposition Did not know what to do Care Advice Given Per Guideline GO TO ED NOW (OR PCP TRIAGE): * IF NO PCP (PRIMARY CARE PROVIDER) SECOND-LEVEL TRIAGE: You need to be seen within the next hour. Go to the [facility] at _____________ [place] as soon as you can. NOTE TO TRIAGER - DRIVING: * Another adult should drive. CARE ADVICE given per Dizziness - Vertigo (Adult) guideline. Comments User: Dannielle Burn, RN Date/Time Eilene Ghazi Time): 10/11/2020 11:00:31 AM pt does not want to go to the ER or urgent care. She wants to make appt. User: Dannielle Burn, RN Date/Time Eilene Ghazi Time): 10/11/2020 11:03:03 AM Called back line and spoke to Lawrence and gave report that pt has vertigo. triage outcome of go to ER now or PCP triage). Pt does not want to go to ER or urgent care. States someone will call pt back Referrals GO TO FACILITY REFUSE

## 2020-10-11 NOTE — Telephone Encounter (Signed)
FYI

## 2020-10-12 ENCOUNTER — Ambulatory Visit (INDEPENDENT_AMBULATORY_CARE_PROVIDER_SITE_OTHER): Payer: Medicare Other | Admitting: Physician Assistant

## 2020-10-12 ENCOUNTER — Other Ambulatory Visit: Payer: Self-pay

## 2020-10-12 ENCOUNTER — Encounter: Payer: Self-pay | Admitting: Physician Assistant

## 2020-10-12 VITALS — BP 130/70 | HR 72 | Temp 97.2°F | Ht 66.0 in | Wt 217.2 lb

## 2020-10-12 DIAGNOSIS — R42 Dizziness and giddiness: Secondary | ICD-10-CM

## 2020-10-12 MED ORDER — FLUTICASONE PROPIONATE 50 MCG/ACT NA SUSP: 2.0000 | Freq: Every day | NASAL | 6 refills | Status: DC

## 2020-10-12 NOTE — Patient Instructions (Addendum)
It was great to see you!  I have sent in Flonase for you.  May trial over the counter dramamine for your symptoms.  I have ordered a physical therapy consult for vestibular rehab for your symptoms.  [facility] -- Appointment with Faustino Congress [address] [place], [place] [address] 9:15am on Tuesday Jan 25 Call and cancel if you don't need this appointment. Phone: [phone]   Contact a doctor if:  You have a headache that feels different than the other headaches.  You feel sick to your stomach (nauseous) or you throw up (vomit).  You have a fever. Get help right away if:  Your headache gets very bad quickly.  Your headache gets worse after a lot of physical activity.  You keep throwing up.  You have a stiff neck.  You have trouble seeing.  You have trouble speaking.  You have pain in the eye or ear.  Your muscles are weak or you lose muscle control.  You lose your balance or have trouble walking.  You feel like you will pass out (faint) or you pass out.  You are mixed up (confused).  You have a seizure.   Take care,  Inda Coke PA-C

## 2020-10-12 NOTE — Progress Notes (Signed)
Tara Murphy is a 75 y.o. female here for a pre-existing problem.  I acted as a Education administrator for [employer], PA-C Anselmo Pickler, LPN   History of Present Illness:   Chief Complaint  Patient presents with  . Dizziness    HPI   Vertigo Pt c/o vertigo x 4 days, started on Monday. Pt vomited on Monday x 1. Has history of vertigo several years ago that was a relatively short case without need for medication. Pt has not taken any medication. Pt has done 2 home COVID tests both were begative, Mon & Wed.  Has tried the maneuvers at home watching YouTube and has been unsuccessful.  Symptoms are worst first thing in the morning. Any rapid movement can provoke this for her.   Denies: headache, fever, slurred speech, weakness on one side of the body, medication changes    Past Medical History:  Diagnosis Date  . Allergy   . Arthritis   . 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HCC)    S/p total hip replacement left  . Breast cancer ([place]) 2015   left   . Cataract   . Cholecystolithiasis   . Chronic allergic rhinitis   . Depression   . Former smoker, stopped smoking in distant past 10/05/2019   Quit 2000; 43 y smoking history  . GERD (gastroesophageal reflux disease)   . Hepatic steatosis 06/08/2020   Intermittent elevated LFTs and ultrasound 2018  . Hyperlipemia   . IBS (irritable bowel syndrome) 10/08/2018   Nl colonoscopy and EGD 09/2018  . Lichen plano-pilaris   . 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 Neuropathy, peripheral, idiopathic    uses gabapentin  . Osteopenia 09/07/2019   Dexa 08/2019: T = -1.2 at wrist; back and hips excluded (DJD and hardware).   . Personal history of radiation therapy 2015  . 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 Repeat 2020  . Urinary tract infection    hx of frequent      Social History   Tobacco Use  . Smoking status: Former Smoker    Packs/day: 1.00    Types: Cigarettes    Quit  date: 11/22/1991    Years since quitting: 28.9  . Smokeless tobacco: Never Used  Vaping Use  . Vaping Use: Never used  Substance Use Topics  . Alcohol use: Yes    Alcohol/week: 4.0 standard drinks    Types: 4 Glasses of wine per week  . Drug use: No    Past Surgical History:  Procedure Laterality Date  . APPENDECTOMY    . BREAST LUMPECTOMY Left 2015  . cataracts    . CHOLECYSTECTOMY N/A 10/29/2016   Procedure: LAPAROSCOPIC CHOLECYSTECTOMY;  Surgeon: Erroll Luna, MD;  Location: [facility];  Service: General;  Laterality: N/A;  . LIPOSUCTION    . TOTAL HIP ARTHROPLASTY     bilateral hip arthoplasty  . TOTAL HIP ARTHROPLASTY Right 12/18/2015   Procedure: RIGHT TOTAL HIP ARTHROPLASTY ANTERIOR APPROACH;  Surgeon: Gaynelle Arabian, MD;  Location: [facility];  Service: Orthopedics;  Laterality: Right;  . TOTAL KNEE ARTHROPLASTY    . TOTAL KNEE ARTHROPLASTY Left 11/25/2016   Procedure: LEFT TOTAL KNEE ARTHROPLASTY;  Surgeon: Gaynelle Arabian, MD;  Location: [facility];  Service: Orthopedics;  Laterality: Left;  with abductor block  . TUBAL LIGATION      Family History  Problem Relation Age of Onset  . Arthritis Mother   . Diabetes Mother   . Crohn's disease  Mother   . Arthritis Father   . Lung cancer Father   . Breast cancer Sister 53  . Hyperlipidemia Maternal Aunt   . Stomach cancer Maternal Grandmother   . Colitis Neg Hx   . Rectal cancer Neg Hx   . Esophageal cancer Neg Hx     Allergies  Allergen Reactions  . Contrast Media [Iodinated Diagnostic Agents] Hives  . Hydromorphone Hcl Nausea Only  . Other Rash    Certain Band-Aids don't agree with the patient's skin; hospital ID wristband causes rashes  . Sulfamethoxazole-Trimethoprim Hives  . Fentanyl Other (See Comments)    Redness and flushing over body   . Tape Rash    Certain Band-Aids don't agree with the patient's skin; hospital ID wristband causes rashes  . Terbinafine And Related Hives and Rash    Current Medications:   Current  Outpatient Medications:  .  atorvastatin (LIPITOR) 10 MG tablet, Take 1 tablet (10 mg total) by mouth 3 (three) times a week., Disp: 36 tablet, Rfl: 3 .  busPIRone (BUSPAR) 7.5 MG tablet, TAKE ONE TABLET BY MOUTH TWICE A DAY AS NEEDED (Patient taking differently: as needed.), Disp: 180 tablet, Rfl: 3 .  celecoxib (CELEBREX) 200 MG capsule, Take 200 mg by mouth daily., Disp: , Rfl:  .  clobetasol (TEMOVATE) 0.05 % external solution, APPLY TO AFFECTED AREA(S) TOPICALLY TWO TIMES A DAY AS NEEDED FOR SCALP INFLAMMATION, Disp: 50 mL, Rfl: 0 .  gabapentin (NEURONTIN) 300 MG capsule, TAKE TWO CAPSULES BY MOUTH TWICE A DAY, Disp: 360 capsule, Rfl: 2 .  rOPINIRole (REQUIP) 4 MG tablet, Take 1 tablet (4 mg total) by mouth at bedtime., Disp: 90 tablet, Rfl: 3 .  traMADol (ULTRAM) 50 MG tablet, Take 1 tablet (50 mg total) by mouth every 6 (six) hours as needed., Disp: 60 tablet, Rfl: 1 .  fluticasone (FLONASE) 50 MCG/ACT nasal spray, Place 2 sprays into both nostrils daily., Disp: 16 g, Rfl: 6   Review of Systems:   ROS  Negative unless otherwise specified per HPI.   Vitals:   Vitals:   10/12/20 1401  BP: 130/70  Pulse: 72  Temp: (!) 97.2 F (36.2 C)  TempSrc: Temporal  SpO2: 96%  Weight: 217 lb 4 oz (98.5 kg)  Height: 5\' 6"  (1.676 m)     Body mass index is 35.07 kg/m.  Physical Exam:   Physical Exam Vitals and nursing note reviewed.  Constitutional:      General: She is not in acute distress.    Appearance: She is well-developed. She is not ill-appearing, toxic-appearing or sickly-appearing.  Cardiovascular:     Rate and Rhythm: Normal rate and regular rhythm.     Pulses: Normal pulses.     Heart sounds: Normal heart sounds, S1 normal and S2 normal.     Comments: No LE edema Pulmonary:     Effort: Pulmonary effort is normal.     Breath sounds: Normal breath sounds.  Skin:    General: Skin is warm, dry and intact.  Neurological:     General: No focal deficit present.     Mental  Status: She is alert.     GCS: GCS eye subscore is 4. GCS verbal subscore is 5. GCS motor subscore is 6.     Cranial Nerves: Cranial nerves are intact.     Sensory: Sensation is intact.     Motor: Motor function is intact.     Coordination: Coordination is intact.  Psychiatric:  Mood and Affect: Mood and affect normal.        Speech: Speech normal.        Behavior: Behavior normal. Behavior is cooperative.     Assessment and Plan:   Zakyia was seen today for dizziness.  Diagnoses and all orders for this visit:  Vertigo No red flags on exam. Suspect BPPV. Was able to secure appointment with Faustino Congress at [facility] on next Tuesday 10/17/20 at 930a. May trial dramamine however recommended avoiding use if possible. I did refill her flonase per her request. Recommend low threshold to go to the ER if any neuro symptoms develop -- she is aware of this. -     Ambulatory referral to Physical Therapy  Other orders -     fluticasone (FLONASE) 50 MCG/ACT nasal spray; Place 2 sprays into both nostrils daily.    CMA or LPN served as scribe during this visit. History, Physical, and Plan performed by medical provider. The above documentation has been reviewed and is accurate and complete.   Inda Coke, PA-C

## 2020-10-17 ENCOUNTER — Encounter: Payer: Medicare Other | Admitting: Physical Therapy

## 2020-10-18 ENCOUNTER — Ambulatory Visit (INDEPENDENT_AMBULATORY_CARE_PROVIDER_SITE_OTHER): Payer: Medicare Other | Admitting: Physical Therapy

## 2020-10-18 ENCOUNTER — Encounter: Payer: Self-pay | Admitting: Physical Therapy

## 2020-10-18 ENCOUNTER — Other Ambulatory Visit: Payer: Self-pay

## 2020-10-18 DIAGNOSIS — H8112 Benign paroxysmal vertigo, left ear: Secondary | ICD-10-CM

## 2020-10-18 DIAGNOSIS — H8111 Benign paroxysmal vertigo, right ear: Secondary | ICD-10-CM

## 2020-10-18 DIAGNOSIS — R42 Dizziness and giddiness: Secondary | ICD-10-CM

## 2020-10-18 NOTE — Telephone Encounter (Signed)
Pt seen by Aldona Bar on 10/12/20 for this concern.

## 2020-10-18 NOTE — Therapy (Signed)
[facility] [address] [place], [place], [address] Phone: [phone]   Fax:  [phone]  Physical Therapy Evaluation  Patient Details  Name: Tara Murphy MRN: [id number] Date of Birth: [birth year] Referring Provider (PT): Len Blalock, [place]   Encounter Date: 10/18/2020   PT End of Session - 10/18/20 1127    Visit Number 1    Number of Visits 6    Date for PT Re-Evaluation 11/29/20    Authorization Type Medicare/AARP    PT Start Time 0845    PT Stop Time 0925    PT Time Calculation (min) 40 min    Activity Tolerance Patient tolerated treatment well    Behavior During Therapy [facility] for tasks assessed/performed           Past Medical History:  Diagnosis Date  . Allergy   . Arthritis   . 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HCC)    S/p total hip replacement left  . Breast cancer ([place]) 2015   left   . Cataract   . Cholecystolithiasis   . Chronic allergic rhinitis   . Depression   . Former smoker, stopped smoking in distant past 10/05/2019   Quit 2000; 23 y smoking history  . GERD (gastroesophageal reflux disease)   . Hepatic steatosis 06/08/2020   Intermittent elevated LFTs and ultrasound 2018  . Hyperlipemia   . IBS (irritable bowel syndrome) 10/08/2018   Nl colonoscopy and EGD 09/2018  . Lichen plano-pilaris   . 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 Neuropathy, peripheral, idiopathic    uses gabapentin  . Osteopenia 09/07/2019   Dexa 08/2019: T = -1.2 at wrist; back and hips excluded (DJD and hardware).   . Personal history of radiation therapy 2015  . 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 Repeat 2020  . Urinary tract infection    hx of frequent     Past Surgical History:  Procedure Laterality Date  . APPENDECTOMY    . BREAST LUMPECTOMY Left 2015  . cataracts    . CHOLECYSTECTOMY N/A 10/29/2016   Procedure: LAPAROSCOPIC CHOLECYSTECTOMY;  Surgeon: Erroll Luna, MD;   Location: [facility];  Service: General;  Laterality: N/A;  . LIPOSUCTION    . TOTAL HIP ARTHROPLASTY     bilateral hip arthoplasty  . TOTAL HIP ARTHROPLASTY Right 12/18/2015   Procedure: RIGHT TOTAL HIP ARTHROPLASTY ANTERIOR APPROACH;  Surgeon: Gaynelle Arabian, MD;  Location: [facility];  Service: Orthopedics;  Laterality: Right;  . TOTAL KNEE ARTHROPLASTY    . TOTAL KNEE ARTHROPLASTY Left 11/25/2016   Procedure: LEFT TOTAL KNEE ARTHROPLASTY;  Surgeon: Gaynelle Arabian, MD;  Location: [facility];  Service: Orthopedics;  Laterality: Left;  with abductor block  . TUBAL LIGATION      There were no vitals filed for this visit.    Subjective Assessment - 10/18/20 0849    Subjective Pt is a 75 y/o female who presents to OPPT for acute vertigo which began on 10/10/19 with episodes of vertigo.  She describes her symptoms as spinning, and the world moves "slightly."    Patient Stated Goals improve dizziness    Currently in Pain? No/denies              [facility] PT Assessment - 10/18/20 0851      Assessment   Medical Diagnosis vertigo    Referring Provider (PT) Len Blalock, PA-C    Onset Date/Surgical Date 10/09/20    Hand Dominance Right  Next MD Visit PRN    Prior Therapy none for this condition      Precautions   Precautions None      Restrictions   Weight Bearing Restrictions No      Balance Screen   Has the patient fallen in the past 6 months No    Has the patient had a decrease in activity level because of a fear of falling?  Yes    Is the patient reluctant to leave their home because of a fear of falling?  No      Home Ecologist residence    Living Arrangements Spouse/significant other    Additional Comments avoiding stairs due to prior ortho surgeries      Prior Function   Level of Independence Independent    Vocation Retired    [employer] retired Economist, sewing, cooking; no regular exercise      Cognition    Overall Cognitive Status Within Functional Limits for tasks assessed                  Vestibular Assessment - 10/18/20 0854      Vestibular Assessment   General Observation amb with wide BOS; symptoms at rest 2-3/10      Symptom Behavior   Subjective history of current problem see subjective    Type of Dizziness  "World moves";Unsteady with head/body turns;Imbalance;Spinning    Duration of Dizziness spinning - seconds; constant imbalance    Symptom Nature Motion provoked;Positional;Constant    Aggravating Factors Lying supine;Mornings;Turning body quickly;Turning head quickly;Rolling to right    Relieving Factors Head stationary;Lying supine;Closing eyes;Rest    Progression of Symptoms Worse      Oculomotor Exam   Oculomotor Alignment Normal    Spontaneous Absent    Gaze-induced  Absent    Smooth Pursuits Intact    Saccades Intact   symptomatic to Lt     Oculomotor Exam-Fixation Suppressed    Left Head Impulse WNL    Right Head Impulse slight symptoms      Positional Testing   Dix-Hallpike Dix-Hallpike Left    Sidelying Test Sidelying Right    Horizontal Canal Testing Horizontal Canal Right;Horizontal Canal Left      Dix-Hallpike Left   Dix-Hallpike Left Duration 10-12 seconds    Dix-Hallpike Left Symptoms Upbeat, left rotatory nystagmus      Sidelying Right   Sidelying Right Duration mild symptoms < 5 sec    Sidelying Right Symptoms No nystagmus      Horizontal Canal Right   Horizontal Canal Right Duration 5 sec; end range nystagmus only noted    Horizontal Canal Right Symptoms Ageotrophic      Horizontal Canal Left   Horizontal Canal Left Duration 15 sec    Horizontal Canal Left Symptoms --   upbearing Lt rotary nystagmus             Objective measurements completed on examination: See above findings.        Vestibular Treatment/Exercise - 10/18/20 1125      Vestibular Treatment/Exercise   Vestibular Treatment Provided Canalith Repositioning     Canalith Repositioning Epley Manuever Left;Canal Roll Left       EPLEY MANUEVER LEFT   Number of Reps  1    Overall Response  Improved Symptoms      Canal Roll Left   Number of Reps  1    Overall Response  Improved Symptoms  PT Education - 10/18/20 1126    Education Details BPPV, POC    Person(s) Educated Patient    Methods Explanation    Comprehension Verbalized understanding               PT Long Term Goals - 10/18/20 1130      PT LONG TERM GOAL #1   Title demonstrate negative positional testing    Status New    Target Date 11/29/20      PT LONG TERM GOAL #2   Title amb without gait deviations or indications of imbalace for improved function    Status New    Target Date 11/29/20      PT LONG TERM GOAL #3   Title independent with HEP if indicated    Status New    Target Date 11/29/20                  Plan - 10/18/20 1127    Clinical Impression Statement Pt is a 75 y/o female who presents to OPPT for acute vertigo.  Pt with Lt pBPPV treated with horizontal roll and epley's today as nystagmus consistent with pBPPV present in both Lt testing positions.  She also has some clinical symptoms consistent with vestibular nerve inflammation/irritation with recent sinus issues.  Discussed trial of decongestant if able.  Will benefit from PT to maximize function.    Personal Factors and Comorbidities Age;Comorbidity 3+    Comorbidities arthritis, bil THA, hx Lt breast cancer, depression, peripheral neuropathy, osteopenia    Examination-Activity Limitations Bed Mobility;Sleep;Bend;Locomotion Level    Examination-Participation Restrictions Community Activity;Shop;Driving    Stability/Clinical Decision Making Evolving/Moderate complexity    Clinical Decision Making Moderate    Rehab Potential Good    PT Frequency 1x / week    PT Duration 6 weeks    PT Treatment/Interventions ADLs/Self Care Home Management;Canalith  Repostioning;Cryotherapy;Moist Heat;Balance training;Therapeutic exercise;Therapeutic activities;Functional mobility training;Neuromuscular re-education;Patient/family education;Vestibular    PT Next Visit Plan reassess canals, give habituation if needed    Consulted and Agree with Plan of Care Patient           Patient will benefit from skilled therapeutic intervention in order to improve the following deficits and impairments:  Abnormal gait,Decreased balance,Decreased mobility,Dizziness  Visit Diagnosis: BPPV (benign paroxysmal positional vertigo), left - Plan: PT plan of care cert/re-cert  Dizziness and giddiness - Plan: PT plan of care cert/re-cert  BPPV (benign paroxysmal positional vertigo), right - Plan: PT plan of care cert/re-cert     Problem List Patient Active Problem List   Diagnosis Date Noted  . Hepatic steatosis 06/08/2020  . Former smoker, stopped smoking in distant past 10/05/2019  . Osteopenia 09/07/2019  . IBS (irritable bowel syndrome) 10/08/2018  . 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 Hammer toe 04/13/2018  . Osteoarthritis of right foot 04/13/2018  . GERD (gastroesophageal reflux disease) 11/20/2017  . Familial hyperlipidemia 11/20/2017  . Lichen plano-pilaris 11/20/2017  . Restless leg syndrome 11/20/2017  . Essential tremor 09/26/2016  . OA (osteoarthritis) of knee 12/18/2015  . OA (osteoarthritis) of hip 12/18/2015  . Osteoarthritis of spine with radiculopathy, 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  . History of Clostridium difficile colitis 06/16/2015  . 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 Chronic allergic rhinitis 06/13/2015  . Neuropathy, peripheral, idiopathic 06/13/2015  . Recurrent urinary tract infection 06/13/2015  . History of left breast cancer 03/20/2015      Laureen Abrahams, PT, DPT 10/18/20 11:35  [facility] [address] [place], [place], [address] Phone: [phone]   Fax:  [phone]  Name: Elta Angell MRN: [id number] Date of Birth: 1946/06/15

## 2020-10-23 ENCOUNTER — Encounter: Payer: Medicare Other | Admitting: Physical Therapy

## 2020-10-23 ENCOUNTER — Telehealth: Payer: Self-pay | Admitting: Physical Therapy

## 2020-10-23 ENCOUNTER — Other Ambulatory Visit: Payer: Self-pay | Admitting: Family Medicine

## 2020-10-23 DIAGNOSIS — Z1231 Encounter for screening mammogram for malignant neoplasm of breast: Secondary | ICD-10-CM

## 2020-10-23 NOTE — Telephone Encounter (Signed)
Pt forgot about PT appt today, reporting her symptoms are resolved.  Will leave chart open x 30 days and d/c if PT is no longer needed.    Laureen Abrahams, PT, DPT 10/23/20 9:11 AM

## 2020-10-25 ENCOUNTER — Other Ambulatory Visit: Payer: Self-pay

## 2020-10-25 ENCOUNTER — Ambulatory Visit (INDEPENDENT_AMBULATORY_CARE_PROVIDER_SITE_OTHER): Payer: Medicare Other | Admitting: Physical Therapy

## 2020-10-25 ENCOUNTER — Encounter: Payer: Self-pay | Admitting: Physical Therapy

## 2020-10-25 DIAGNOSIS — R42 Dizziness and giddiness: Secondary | ICD-10-CM | POA: Diagnosis not present

## 2020-10-25 DIAGNOSIS — H8111 Benign paroxysmal vertigo, right ear: Secondary | ICD-10-CM | POA: Diagnosis not present

## 2020-10-25 DIAGNOSIS — H8112 Benign paroxysmal vertigo, left ear: Secondary | ICD-10-CM | POA: Diagnosis not present

## 2020-10-25 NOTE — Therapy (Signed)
[facility] Therapy [address] [place], [place], [address] Phone: [phone]   Fax:  [phone]  Physical Therapy Treatment  Patient Details  Name: Tara Murphy MRN: [id number] Date of Birth: 05-May-1946 Referring Provider (PT): Len Blalock, [place]   Encounter Date: 10/25/2020   PT End of Session - 10/25/20 1426    Visit Number 2    Number of Visits 6    Date for PT Re-Evaluation 11/29/20    Authorization Type Medicare/AARP    PT Start Time 1346    PT Stop Time 1423    PT Time Calculation (min) 37 min    Activity Tolerance Patient tolerated treatment well    Behavior During Therapy [facility] for tasks assessed/performed           Past Medical History:  Diagnosis Date  . Allergy   . Arthritis   . 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HCC)    S/p total hip replacement left  . Breast cancer ([place]) 2015   left   . Cataract   . Cholecystolithiasis   . Chronic allergic rhinitis   . Depression   . Former smoker, stopped smoking in distant past 10/05/2019   Quit 2000; 17 y smoking history  . GERD (gastroesophageal reflux disease)   . Hepatic steatosis 06/08/2020   Intermittent elevated LFTs and ultrasound 2018  . Hyperlipemia   . IBS (irritable bowel syndrome) 10/08/2018   Nl colonoscopy and EGD 09/2018  . Lichen plano-pilaris   . 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 Neuropathy, peripheral, idiopathic    uses gabapentin  . Osteopenia 09/07/2019   Dexa 08/2019: T = -1.2 at wrist; back and hips excluded (DJD and hardware).   . Personal history of radiation therapy 2015  . 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 Repeat 2020  . Urinary tract infection    hx of frequent     Past Surgical History:  Procedure Laterality Date  . APPENDECTOMY    . BREAST LUMPECTOMY Left 2015  . cataracts    . CHOLECYSTECTOMY N/A 10/29/2016   Procedure: LAPAROSCOPIC CHOLECYSTECTOMY;  Surgeon: Erroll Luna, MD;   Location: [place];  Service: General;  Laterality: N/A;  . LIPOSUCTION    . TOTAL HIP ARTHROPLASTY     bilateral hip arthoplasty  . TOTAL HIP ARTHROPLASTY Right 12/18/2015   Procedure: RIGHT TOTAL HIP ARTHROPLASTY ANTERIOR APPROACH;  Surgeon: Gaynelle Arabian, MD;  [facility];  Service: Orthopedics;  Laterality: Right;  . TOTAL KNEE ARTHROPLASTY    . TOTAL KNEE ARTHROPLASTY Left 11/25/2016   Procedure: LEFT TOTAL KNEE ARTHROPLASTY;  Surgeon: Gaynelle Arabian, MD;  Location: [facility];  Service: Orthopedics;  Laterality: Left;  with abductor block  . TUBAL LIGATION      There were no vitals filed for this visit.   Subjective Assessment - 10/25/20 1350    Subjective was doing really well, then woke up this morning with vertigo    Patient Stated Goals improve dizziness    Currently in Pain? No/denies                   Vestibular Assessment - 10/25/20 1351      Positional Testing   Dix-Hallpike Dix-Hallpike Left    Horizontal Canal Testing Horizontal Canal Left      Dix-Hallpike Left   Dix-Hallpike Left Duration 12-15 sec    Dix-Hallpike Left Symptoms Upbeat, left rotatory nystagmus      Horizontal Canal Left  Horizontal Canal Left Duration 15 sec    Horizontal Canal Left Symptoms --   upbearing Lt rotary nystagmus                    Vestibular Treatment/Exercise - 10/25/20 1404      Vestibular Treatment/Exercise   Vestibular Treatment Provided Canalith Repositioning    Canalith Repositioning Epley Manuever Left;Canal Roll Left       EPLEY MANUEVER LEFT   Number of Reps  1    Overall Response  Improved Symptoms      Canal Roll Left   Number of Reps  1    Overall Response  Improved Symptoms                 PT Education - 10/25/20 1426    Education Details HEP    Person(s) Educated Patient    Methods Explanation;Demonstration    Comprehension Verbalized understanding               PT Long Term Goals - 10/18/20 1130      PT LONG TERM  GOAL #1   Title demonstrate negative positional testing    Status New    Target Date 11/29/20      PT LONG TERM GOAL #2   Title amb without gait deviations or indications of imbalace for improved function    Status New    Target Date 11/29/20      PT LONG TERM GOAL #3   Title independent with HEP if indicated    Status New    Target Date 11/29/20                 Plan - 10/25/20 1426    Clinical Impression Statement Pt still with persistent Lt BPPV with improved symptoms after session.  Issued habituation today, and will continue with canalith repositioning as needed.    Personal Factors and Comorbidities Age;Comorbidity 3+    Comorbidities arthritis, bil THA, hx Lt breast cancer, depression, peripheral neuropathy, osteopenia    Examination-Activity Limitations Bed Mobility;Sleep;Bend;Locomotion Level    Examination-Participation Restrictions Community Activity;Shop;Driving    Stability/Clinical Decision Making Evolving/Moderate complexity    Rehab Potential Good    PT Frequency 1x / week    PT Duration 6 weeks    PT Treatment/Interventions ADLs/Self Care Home Management;Canalith Repostioning;Cryotherapy;Moist Heat;Balance training;Therapeutic exercise;Therapeutic activities;Functional mobility training;Neuromuscular re-education;Patient/family education;Vestibular    PT Next Visit Plan reassess canals, review habituation    PT Home Exercise Plan Access Code: [email or web address]    Consulted and Agree with Plan of Care Patient           Patient will benefit from skilled therapeutic intervention in order to improve the following deficits and impairments:  Abnormal gait,Decreased balance,Decreased mobility,Dizziness  Visit Diagnosis: BPPV (benign paroxysmal positional vertigo), left  Dizziness and giddiness  BPPV (benign paroxysmal positional vertigo), right     Problem List Patient Active Problem List   Diagnosis Date Noted  . Hepatic steatosis 06/08/2020  . Former  smoker, stopped smoking in distant past 10/05/2019  . Osteopenia 09/07/2019  . IBS (irritable bowel syndrome) 10/08/2018  . 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 Hammer toe 04/13/2018  . Osteoarthritis of right foot 04/13/2018  . GERD (gastroesophageal reflux disease) 11/20/2017  . Familial hyperlipidemia 11/20/2017  . Lichen plano-pilaris 11/20/2017  . Restless leg syndrome 11/20/2017  . Essential tremor 09/26/2016  . OA (osteoarthritis) of knee 12/18/2015  . OA (osteoarthritis) of hip 12/18/2015  . Osteoarthritis of spine with  radiculopathy, 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  . History of Clostridium difficile colitis 06/16/2015  . 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 Chronic allergic rhinitis 06/13/2015  . Neuropathy, peripheral, idiopathic 06/13/2015  . Recurrent urinary tract infection 06/13/2015  . History of left breast cancer 03/20/2015      Laureen Abrahams, PT, DPT 10/25/20 2:28 PM    [facility] Physical Therapy [address] [place], [place], [address] Phone: [phone]   Fax:  [phone]  Name: Tara Murphy MRN: [id number] Date of Birth: 1946/05/23

## 2020-10-25 NOTE — Patient Instructions (Signed)
Access Code: [email or web address] URL: [email or web address] Date: 10/25/2020 Prepared by: Faustino Congress  Exercises Brandt-Daroff Vestibular Exercise - 2 x daily - 7 x weekly - 1 sets - 3-5 reps Right Side Lying to Left Side Lying Vestibular Habituation - 2 x daily - 7 x weekly - 1 sets - 3-5 reps

## 2020-10-30 DIAGNOSIS — M25552 Pain in left hip: Secondary | ICD-10-CM | POA: Insufficient documentation

## 2020-11-01 DIAGNOSIS — M25552 Pain in left hip: Secondary | ICD-10-CM | POA: Diagnosis not present

## 2020-11-01 DIAGNOSIS — Z96642 Presence of left artificial hip joint: Secondary | ICD-10-CM | POA: Diagnosis not present

## 2020-11-02 DIAGNOSIS — Z96642 Presence of left artificial hip joint: Secondary | ICD-10-CM | POA: Diagnosis not present

## 2020-11-03 ENCOUNTER — Ambulatory Visit (INDEPENDENT_AMBULATORY_CARE_PROVIDER_SITE_OTHER): Payer: Medicare Other | Admitting: Physical Therapy

## 2020-11-03 ENCOUNTER — Other Ambulatory Visit: Payer: Self-pay

## 2020-11-03 ENCOUNTER — Encounter: Payer: Self-pay | Admitting: Physical Therapy

## 2020-11-03 DIAGNOSIS — H8112 Benign paroxysmal vertigo, left ear: Secondary | ICD-10-CM

## 2020-11-03 DIAGNOSIS — R42 Dizziness and giddiness: Secondary | ICD-10-CM | POA: Diagnosis not present

## 2020-11-03 DIAGNOSIS — H8111 Benign paroxysmal vertigo, right ear: Secondary | ICD-10-CM | POA: Diagnosis not present

## 2020-11-03 NOTE — Therapy (Signed)
[facility] [name] [place], [place], [address] Phone: [phone]   Fax:  [phone]  Physical Therapy Treatment  Patient Details  Name: Margart Zemanek MRN: [id number] Date of Birth: Aug 28, 1946 Referring Provider (PT): Len Blalock, [place]   Encounter Date: 11/03/2020   PT End of Session - 11/03/20 1016    Visit Number 3    Number of Visits 6    Date for PT Re-Evaluation 11/29/20    Authorization Type Medicare/AARP    PT Start Time 0920    PT Stop Time 1000    PT Time Calculation (min) 40 min    Activity Tolerance Patient tolerated treatment well    Behavior During Therapy [facility] for tasks assessed/performed           Past Medical History:  Diagnosis Date  . Allergy   . Arthritis   . 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HCC)    S/p total hip replacement left  . Breast cancer ([place]) 2015   left   . Cataract   . Cholecystolithiasis   . Chronic allergic rhinitis   . Depression   . Former smoker, stopped smoking in distant past 10/05/2019   Quit 2000; 64 y smoking history  . GERD (gastroesophageal reflux disease)   . Hepatic steatosis 06/08/2020   Intermittent elevated LFTs and ultrasound 2018  . Hyperlipemia   . IBS (irritable bowel syndrome) 10/08/2018   Nl colonoscopy and EGD 09/2018  . Lichen plano-pilaris   . 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 Neuropathy, peripheral, idiopathic    uses gabapentin  . Osteopenia 09/07/2019   Dexa 08/2019: T = -1.2 at wrist; back and hips excluded (DJD and hardware).   . Personal history of radiation therapy 2015  . 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 Repeat 2020  . Urinary tract infection    hx of frequent     Past Surgical History:  Procedure Laterality Date  . APPENDECTOMY    . BREAST LUMPECTOMY Left 2015  . cataracts    . CHOLECYSTECTOMY N/A 10/29/2016   Procedure: LAPAROSCOPIC CHOLECYSTECTOMY;  Surgeon: Erroll Luna, MD;  Location: [place];  Service: General;  Laterality: N/A;  . LIPOSUCTION    . TOTAL HIP ARTHROPLASTY     bilateral hip arthoplasty  . TOTAL HIP ARTHROPLASTY Right 12/18/2015   Procedure: RIGHT TOTAL HIP ARTHROPLASTY ANTERIOR APPROACH;  Surgeon: Gaynelle Arabian, MD;  [facility];  Service: Orthopedics;  Laterality: Right;  . TOTAL KNEE ARTHROPLASTY    . TOTAL KNEE ARTHROPLASTY Left 11/25/2016   Procedure: LEFT TOTAL KNEE ARTHROPLASTY;  Surgeon: Gaynelle Arabian, MD;  [facility];  Service: Orthopedics;  Laterality: Left;  with abductor block  . TUBAL LIGATION      There were no vitals filed for this visit.   Subjective Assessment - 11/03/20 1007    Subjective feeling better overall, still having some mild spinning symptoms    Patient Stated Goals improve dizziness    Currently in Pain? No/denies                   Vestibular Assessment - 11/03/20 1007      Vestibular Assessment   General Observation improved amb today; no significant deviations noted      Positional Testing   Dix-Hallpike Dix-Hallpike Right;Dix-Hallpike Left    Horizontal Canal Testing Horizontal Canal Left      Dix-Hallpike Right   Dix-Hallpike Right Duration < 5  sec; minimal -pt reporting minimal symptoms    Dix-Hallpike Right Symptoms Upbeat, right rotatory nystagmus      Dix-Hallpike Left   Dix-Hallpike Left Duration 12-15 sec - very light and mild    Dix-Hallpike Left Symptoms Upbeat, left rotatory nystagmus      Horizontal Canal Left   Horizontal Canal Left Duration 15 sec    Horizontal Canal Left Symptoms --   upbearing Lt rotary nystagmus                    Vestibular Treatment/Exercise - 11/03/20 0001      Vestibular Treatment/Exercise   Vestibular Treatment Provided Canalith Repositioning    Canalith Repositioning Canal Roll Left    Habituation Exercises Brandt Daroff;Horizontal Roll      Canal Roll Left   Number of Reps  1    Overall Response  Improved  Symptoms    Response Details  head shaking prior x 10 sec      Nestor Lewandowsky   Symptom Description  reinforced performing at home until symptoms free x 2 days      Horizontal Roll   Symptom Description  reinforced performing at home until symptoms free x 2 days                      PT Long Term Goals - 11/03/20 1016      PT LONG TERM GOAL #1   Title demonstrate negative positional testing    Status On-going    Target Date 11/29/20      PT LONG TERM GOAL #2   Title amb without gait deviations or indications of imbalace for improved function    Status Achieved      PT LONG TERM GOAL #3   Title independent with HEP if indicated    Status On-going                 Plan - 11/03/20 1016    Clinical Impression Statement Continued Lt BPPV noted today, so trial of head shaking prior to canalith repositioning performed with positive response of improved symptoms during maneuver.  Will reassess next week, and may need to add vibration to help with otoconia repositioning.    Personal Factors and Comorbidities Age;Comorbidity 3+    Comorbidities arthritis, bil THA, hx Lt breast cancer, depression, peripheral neuropathy, osteopenia    Examination-Activity Limitations Bed Mobility;Sleep;Bend;Locomotion Level    Examination-Participation Restrictions Community Activity;Shop;Driving    Stability/Clinical Decision Making Evolving/Moderate complexity    Rehab Potential Good    PT Frequency 1x / week    PT Duration 6 weeks    PT Treatment/Interventions ADLs/Self Care Home Management;Canalith Repostioning;Cryotherapy;Moist Heat;Balance training;Therapeutic exercise;Therapeutic activities;Functional mobility training;Neuromuscular re-education;Patient/family education;Vestibular    PT Next Visit Plan reassess canals, review habituation    PT Home Exercise Plan Access Code: [email or web address]    Consulted and Agree with Plan of Care Patient           Patient will benefit from skilled  therapeutic intervention in order to improve the following deficits and impairments:  Abnormal gait,Decreased balance,Decreased mobility,Dizziness  Visit Diagnosis: Dizziness and giddiness  BPPV (benign paroxysmal positional vertigo), left  BPPV (benign paroxysmal positional vertigo), right     Problem List Patient Active Problem List   Diagnosis Date Noted  . Hepatic steatosis 06/08/2020  . Former smoker, stopped smoking in distant past 10/05/2019  . Osteopenia 09/07/2019  . IBS (irritable bowel syndrome) 10/08/2018  . 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  Hammer toe 04/13/2018  . Osteoarthritis of right foot 04/13/2018  . GERD (gastroesophageal reflux disease) 11/20/2017  . Familial hyperlipidemia 11/20/2017  . Lichen plano-pilaris 11/20/2017  . Restless leg syndrome 11/20/2017  . Essential tremor 09/26/2016  . OA (osteoarthritis) of knee 12/18/2015  . OA (osteoarthritis) of hip 12/18/2015  . Osteoarthritis of spine with radiculopathy, 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  . History of Clostridium difficile colitis 06/16/2015  . 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 Chronic allergic rhinitis 06/13/2015  . Neuropathy, peripheral, idiopathic 06/13/2015  . Recurrent urinary tract infection 06/13/2015  . History of left breast cancer 03/20/2015      Laureen Abrahams, PT, DPT 11/03/20 10:19 AM     [facility] [address] [place], [place], [address] Phone: [phone]   Fax:  [phone]  Name: Ruthann Angulo MRN: [id number] Date of Birth: 17-Jul-1946

## 2020-11-07 ENCOUNTER — Other Ambulatory Visit: Payer: Self-pay

## 2020-11-07 ENCOUNTER — Ambulatory Visit (INDEPENDENT_AMBULATORY_CARE_PROVIDER_SITE_OTHER): Payer: Medicare Other | Admitting: Physical Therapy

## 2020-11-07 ENCOUNTER — Encounter: Payer: Self-pay | Admitting: Physical Therapy

## 2020-11-07 DIAGNOSIS — H8111 Benign paroxysmal vertigo, right ear: Secondary | ICD-10-CM

## 2020-11-07 DIAGNOSIS — H8112 Benign paroxysmal vertigo, left ear: Secondary | ICD-10-CM

## 2020-11-07 DIAGNOSIS — R42 Dizziness and giddiness: Secondary | ICD-10-CM | POA: Diagnosis not present

## 2020-11-07 NOTE — Therapy (Addendum)
[facility] Physical Therapy [address] [place], [place], [address] Phone: [phone]   Fax:  [phone]  Physical Therapy Treatment/Discharge Summary  Patient Details  Name: Tara Murphy MRN: [id number] Date of Birth: August 19, 1946 Referring Provider (PT): Len Blalock, [place]   Encounter Date: 11/07/2020   PT End of Session - 11/07/20 0920    Visit Number 4    Number of Visits 6    Date for PT Re-Evaluation 11/29/20    Authorization Type Medicare/AARP    PT Start Time 0845    PT Stop Time 0915    PT Time Calculation (min) 30 min    Activity Tolerance Patient tolerated treatment well    Behavior During Therapy [facility] for tasks assessed/performed           Past Medical History:  Diagnosis Date  . Allergy   . Arthritis   . 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HCC)    S/p total hip replacement left  . Breast cancer ([place]) 2015   left   . Cataract   . Cholecystolithiasis   . Chronic allergic rhinitis   . Depression   . Former smoker, stopped smoking in distant past 10/05/2019   Quit 2000; 8 y smoking history  . GERD (gastroesophageal reflux disease)   . Hepatic steatosis 06/08/2020   Intermittent elevated LFTs and ultrasound 2018  . Hyperlipemia   . IBS (irritable bowel syndrome) 10/08/2018   Nl colonoscopy and EGD 09/2018  . Lichen plano-pilaris   . 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 Neuropathy, peripheral, idiopathic    uses gabapentin  . Osteopenia 09/07/2019   Dexa 08/2019: T = -1.2 at wrist; back and hips excluded (DJD and hardware).   . Personal history of radiation therapy 2015  . 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 Repeat 2020  . Urinary tract infection    hx of frequent     Past Surgical History:  Procedure Laterality Date  . APPENDECTOMY    . BREAST LUMPECTOMY Left 2015  . cataracts    . CHOLECYSTECTOMY N/A 10/29/2016   Procedure: LAPAROSCOPIC CHOLECYSTECTOMY;  Surgeon:  Erroll Luna, MD;  Location: [place];  Service: General;  Laterality: N/A;  . LIPOSUCTION    . TOTAL HIP ARTHROPLASTY     bilateral hip arthoplasty  . TOTAL HIP ARTHROPLASTY Right 12/18/2015   Procedure: RIGHT TOTAL HIP ARTHROPLASTY ANTERIOR APPROACH;  Surgeon: Gaynelle Arabian, MD;  [facility];  Service: Orthopedics;  Laterality: Right;  . TOTAL KNEE ARTHROPLASTY    . TOTAL KNEE ARTHROPLASTY Left 11/25/2016   Procedure: LEFT TOTAL KNEE ARTHROPLASTY;  Surgeon: Gaynelle Arabian, MD;  [facility];  Service: Orthopedics;  Laterality: Left;  with abductor block  . TUBAL LIGATION      There were no vitals filed for this visit.   Subjective Assessment - 11/07/20 0849    Subjective not worse or better - spinning is still present; Lt hip is very painful right now, she's seeing Dr. Wynelle Link this week    Patient Stated Goals improve dizziness                   Vestibular Assessment - 11/07/20 0852      Horizontal Canal Left   Horizontal Canal Left Duration 5 sec    Horizontal Canal Left Symptoms --   upbearing Lt rotary nystagmus  Vestibular Treatment/Exercise - 11/07/20 0913      Vestibular Treatment/Exercise   Vestibular Treatment Provided Canalith Repositioning    Canalith Repositioning Canal Roll Left      Canal Roll Left   Number of Reps  2    Overall Response  Improved Symptoms    Response Details  head shaking prior x 10 sec; percussive device x 10 sec in 1st and 3rd position                      PT Long Term Goals - 11/03/20 1016      PT LONG TERM GOAL #1   Title demonstrate negative positional testing    Status On-going    Target Date 11/29/20      PT LONG TERM GOAL #2   Title amb without gait deviations or indications of imbalace for improved function    Status Achieved      PT LONG TERM GOAL #3   Title independent with HEP if indicated    Status On-going                 Plan - 11/07/20 5397     Clinical Impression Statement Pt still with Lt BPPV today with improving intensity overall, and utilized head shaking and percussion today to see if this will help with moving otoconia.  Will continue to benefit from PT to maximize fnction.  She's going out of town next week and unsure when she will return, so will plan to follow up PRN.    Personal Factors and Comorbidities Age;Comorbidity 3+    Comorbidities arthritis, bil THA, hx Lt breast cancer, depression, peripheral neuropathy, osteopenia    Examination-Activity Limitations Bed Mobility;Sleep;Bend;Locomotion Level    Examination-Participation Restrictions Community Activity;Shop;Driving    Stability/Clinical Decision Making Evolving/Moderate complexity    Rehab Potential Good    PT Frequency 1x / week    PT Duration 6 weeks    PT Treatment/Interventions ADLs/Self Care Home Management;Canalith Repostioning;Cryotherapy;Moist Heat;Balance training;Therapeutic exercise;Therapeutic activities;Functional mobility training;Neuromuscular re-education;Patient/family education;Vestibular    PT Next Visit Plan reassess canals, review habituation    PT Home Exercise Plan Access Code: [email or web address]    Consulted and Agree with Plan of Care Patient           Patient will benefit from skilled therapeutic intervention in order to improve the following deficits and impairments:  Abnormal gait,Decreased balance,Decreased mobility,Dizziness  Visit Diagnosis: BPPV (benign paroxysmal positional vertigo), left  Dizziness and giddiness  BPPV (benign paroxysmal positional vertigo), right     Problem List Patient Active Problem List   Diagnosis Date Noted  . Hepatic steatosis 06/08/2020  . Former smoker, stopped smoking in distant past 10/05/2019  . Osteopenia 09/07/2019  . IBS (irritable bowel syndrome) 10/08/2018  . 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 Hammer toe 04/13/2018  . Osteoarthritis of right foot 04/13/2018  . GERD  (gastroesophageal reflux disease) 11/20/2017  . Familial hyperlipidemia 11/20/2017  . Lichen plano-pilaris 11/20/2017  . Restless leg syndrome 11/20/2017  . Essential tremor 09/26/2016  . OA (osteoarthritis) of knee 12/18/2015  . OA (osteoarthritis) of hip 12/18/2015  . Osteoarthritis of spine with radiculopathy, 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  . History of Clostridium difficile colitis 06/16/2015  . 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 Chronic allergic rhinitis 06/13/2015  . Neuropathy, peripheral, idiopathic 06/13/2015  . Recurrent urinary tract infection 06/13/2015  . History of left breast cancer 03/20/2015  Laureen Abrahams, PT, DPT 11/07/20 9:24 AM    [facility] Physical Therapy [address] [place], [place], [address] Phone: [phone]   Fax:  [phone]  Name: Tara Murphy MRN: [id number] Date of Birth: 01/04/1946     PHYSICAL THERAPY DISCHARGE SUMMARY  Visits from Start of Care: 4  Current functional level related to goals / functional outcomes: See above   Remaining deficits: See above; pt advised to call upon returning from out of town if vertigo continued   Education / Equipment: HEP  Plan: Patient agrees to discharge.  Patient goals were partially met. Patient is being discharged due to not returning since the last visit.  ?????    Laureen Abrahams, PT, DPT 12/27/20 12:18 PM [facility] [address] [place], [place], [address] Phone: [phone]   Fax:  [phone]

## 2020-11-09 DIAGNOSIS — Z96642 Presence of left artificial hip joint: Secondary | ICD-10-CM | POA: Diagnosis not present

## 2020-11-25 DIAGNOSIS — M25552 Pain in left hip: Secondary | ICD-10-CM | POA: Diagnosis not present

## 2020-11-25 DIAGNOSIS — Z96642 Presence of left artificial hip joint: Secondary | ICD-10-CM | POA: Diagnosis not present

## 2020-11-30 DIAGNOSIS — M7062 Trochanteric bursitis, left hip: Secondary | ICD-10-CM | POA: Diagnosis not present

## 2020-12-06 ENCOUNTER — Telehealth: Payer: Self-pay

## 2020-12-06 ENCOUNTER — Other Ambulatory Visit: Payer: Self-pay

## 2020-12-06 ENCOUNTER — Ambulatory Visit
Admission: RE | Admit: 2020-12-06 | Discharge: 2020-12-06 | Disposition: A | Discharge status: Home / Self Care | Payer: Medicare Other | Source: Ambulatory Visit | Attending: Family Medicine | Admitting: Family Medicine

## 2020-12-06 DIAGNOSIS — Z1231 Encounter for screening mammogram for malignant neoplasm of breast: Secondary | ICD-10-CM

## 2020-12-06 NOTE — Telephone Encounter (Signed)
Patient is having UTI symptoms again and would like a ciprofloxacin ( CIPRO)  500 MG  Sent it for her please advise  [facility] [address] [place]

## 2020-12-06 NOTE — Telephone Encounter (Signed)
Please advise, I do not see any recent OV with you for reoccurring UTI

## 2020-12-07 ENCOUNTER — Other Ambulatory Visit: Payer: Self-pay

## 2020-12-07 ENCOUNTER — Other Ambulatory Visit (INDEPENDENT_AMBULATORY_CARE_PROVIDER_SITE_OTHER): Payer: Medicare Other

## 2020-12-07 DIAGNOSIS — R300 Dysuria: Secondary | ICD-10-CM | POA: Diagnosis not present

## 2020-12-07 LAB — POCT URINALYSIS DIPSTICK
Blood, UA: NEGATIVE
Glucose, UA: NEGATIVE
Ketones, UA: NEGATIVE
Protein, UA: NEGATIVE
Spec Grav, UA: 1.015 (ref 1.010–1.025)
Urobilinogen, UA: 1 E.U./dL
pH, UA: 6 (ref 5.0–8.0)

## 2020-12-07 NOTE — Telephone Encounter (Signed)
Patient is scheduled for lab appt today. UA and culture ordered future

## 2020-12-07 NOTE — Telephone Encounter (Signed)
Due to history or resistant bacteria, need urine cutlure. Can schedule lab visit for UA and culture and I can send in abx. Afterwards. Thanks.

## 2020-12-08 ENCOUNTER — Encounter: Payer: Self-pay | Admitting: Family Medicine

## 2020-12-08 ENCOUNTER — Telehealth: Payer: Self-pay

## 2020-12-08 ENCOUNTER — Other Ambulatory Visit: Payer: Self-pay

## 2020-12-08 ENCOUNTER — Ambulatory Visit (AMBULATORY_SURGERY_CENTER): Payer: Self-pay | Admitting: *Deleted

## 2020-12-08 VITALS — Ht 66.0 in | Wt 217.6 lb

## 2020-12-08 DIAGNOSIS — Z86010 Personal history of colonic polyps: Secondary | ICD-10-CM

## 2020-12-08 MED ORDER — CIPROFLOXACIN HCL 500 MG PO TABS: 500.0000 mg | ORAL_TABLET | Freq: Two times a day (BID) | ORAL | 0 refills | Status: AC

## 2020-12-08 MED ORDER — SUPREP BOWEL PREP KIT 17.5-3.13-1.6 GM/177ML PO SOLN: 1.0000 | Freq: Once | ORAL | 0 refills | Status: AC

## 2020-12-08 NOTE — Telephone Encounter (Signed)
Culture is still being processed. Want to wait for results before antibiotic is given? Please advise

## 2020-12-08 NOTE — Progress Notes (Signed)
  No trouble with anesthesia, denies being told they were difficult to intubate, or hx/fam hx of malignant hyperthermia per pt   No egg or soy allergy  No home oxygen use   No medications for weight loss taken  Pt denies constipation issues  Pt informed that we do not do prior authorizations for prep

## 2020-12-08 NOTE — Progress Notes (Signed)
Please call patient: I have reviewed his/her lab results. Urine appears infected. Sent in RX for cipro. Please notify pt

## 2020-12-08 NOTE — Addendum Note (Signed)
Addended by: Billey Chang on: 12/08/2020 02:24 PM   Modules accepted: Orders

## 2020-12-08 NOTE — Telephone Encounter (Signed)
Pt is wanting to know about her urine culture. She states she is still burning very badly and thinks she needs medication.

## 2020-12-09 LAB — URINE CULTURE
MICRO NUMBER:: 11659929
SPECIMEN QUALITY:: ADEQUATE

## 2020-12-11 NOTE — Progress Notes (Signed)
+   UTI sensitive to abx used. No further action needed

## 2020-12-18 ENCOUNTER — Ambulatory Visit: Payer: Medicare Other | Admitting: Neurology

## 2020-12-22 ENCOUNTER — Encounter: Payer: Self-pay | Admitting: Gastroenterology

## 2020-12-22 ENCOUNTER — Other Ambulatory Visit: Payer: Self-pay

## 2020-12-22 ENCOUNTER — Telehealth: Payer: Self-pay

## 2020-12-22 ENCOUNTER — Ambulatory Visit (AMBULATORY_SURGERY_CENTER): Payer: Medicare Other | Admitting: Gastroenterology

## 2020-12-22 VITALS — BP 166/76 | HR 66 | Temp 96.6°F | Resp 36 | Ht 66.0 in | Wt 217.2 lb

## 2020-12-22 DIAGNOSIS — K621 Rectal polyp: Secondary | ICD-10-CM

## 2020-12-22 DIAGNOSIS — K635 Polyp of colon: Secondary | ICD-10-CM

## 2020-12-22 DIAGNOSIS — Z86010 Personal history of colonic polyps: Secondary | ICD-10-CM

## 2020-12-22 DIAGNOSIS — D128 Benign neoplasm of rectum: Secondary | ICD-10-CM

## 2020-12-22 DIAGNOSIS — D122 Benign neoplasm of ascending colon: Secondary | ICD-10-CM

## 2020-12-22 DIAGNOSIS — M1990 Unspecified osteoarthritis, unspecified site: Secondary | ICD-10-CM | POA: Diagnosis not present

## 2020-12-22 MED ORDER — SODIUM CHLORIDE 0.9 % IV SOLN: 500.0000 mL | Freq: Once | INTRAVENOUS | Status: DC

## 2020-12-22 NOTE — Telephone Encounter (Signed)
-----   Message from Doran Stabler, MD sent at 12/22/2020 11:55 AM EDT ----- Please send a referral to Dr. Leighton Ruff at [place] for symptomatic internal and external hemorrhoids.

## 2020-12-22 NOTE — Patient Instructions (Signed)
HANDOUTS PROVIDED ON: POLYPS, DIVERTICULOSIS, & HEMORRHOIDS  The polyps removed today have been sent for pathology.  The results can take 1-3 weeks to receive.  When your next colonoscopy should occur will be based on the pathology results.    You may resume your previous diet and medication schedule.  Thank you for allowing [name] to care for you today!!!   YOU HAD AN ENDOSCOPIC PROCEDURE TODAY AT [facility]:   Refer to the procedure report that was given to you for any specific questions about what was found during the examination.  If the procedure report does not answer your questions, please call your gastroenterologist to clarify.  If you requested that your care partner not be given the details of your procedure findings, then the procedure report has been included in a sealed envelope for you to review at your convenience later.  YOU SHOULD EXPECT: Some feelings of bloating in the abdomen. Passage of more gas than usual.  Walking can help get rid of the air that was put into your GI tract during the procedure and reduce the bloating. If you had a lower endoscopy (such as a colonoscopy or flexible sigmoidoscopy) you may notice spotting of blood in your stool or on the toilet paper. If you underwent a bowel prep for your procedure, you may not have a normal bowel movement for a few days.  Please Note:  You might notice some irritation and congestion in your nose or some drainage.  This is from the oxygen used during your procedure.  There is no need for concern and it should clear up in a day or so.  SYMPTOMS TO REPORT IMMEDIATELY:   Following lower endoscopy (colonoscopy or flexible sigmoidoscopy):  Excessive amounts of blood in the stool  Significant tenderness or worsening of abdominal pains  Swelling of the abdomen that is new, acute  Fever of 100F or higher  For urgent or emergent issues, a gastroenterologist can be reached at any hour by calling [phone]. Do  not use MyChart messaging for urgent concerns.    DIET:  We do recommend a small meal at first, but then you may proceed to your regular diet.  Drink plenty of fluids but you should avoid alcoholic beverages for 24 hours.  ACTIVITY:  You should plan to take it easy for the rest of today and you should NOT DRIVE or use heavy machinery until tomorrow (because of the sedation medicines used during the test).    FOLLOW UP: Our staff will call the number listed on your records Tuesday morning between 7:15 am- 8:15 am following your procedure to check on you and address any questions or concerns that you may have regarding the information given to you following your procedure. If we do not reach you, we will leave a message.  We will attempt to reach you two times.  During this call, we will ask if you have developed any symptoms of COVID 19. If you develop any symptoms (ie: fever, flu-like symptoms, shortness of breath, cough etc.) before then, please call [phone].  If you test positive for Covid 19 in the 2 weeks post procedure, please call and report this information to [name].    If any biopsies were taken you will be contacted by phone or by letter within the next 1-3 weeks.  Please call us at [phone] if you have not heard about the biopsies in 3 weeks.    SIGNATURES/CONFIDENTIALITY: You and/or your care partner  have signed paperwork which will be entered into your electronic medical record.  These signatures attest to the fact that that the information above on your After Visit Summary has been reviewed and is understood.  Full responsibility of the confidentiality of this discharge information lies with you and/or your care-partner.

## 2020-12-22 NOTE — Progress Notes (Signed)
VS by CW  Pt's states no medical or surgical changes since previsit or office visit.  

## 2020-12-22 NOTE — Progress Notes (Signed)
Called to room to assist during endoscopic procedure.  Patient ID and intended procedure confirmed with present staff. Received instructions for my participation in the procedure from the performing physician.  

## 2020-12-22 NOTE — Progress Notes (Signed)
Report to PACU, RN, vss, BBS= Clear.  

## 2020-12-22 NOTE — Op Note (Signed)
[facility] Patient Name: Tara Murphy Procedure Date: 12/22/2020 11:00 AM MRN: [id number] Endoscopist: Mallie Mussel L. Loletha Carrow , MD Age: 75 Referring MD:  Date of Birth: 1946-03-13 Gender: Female Account #: [id number] Procedure:                Colonoscopy Indications:              Surveillance: History of piecemeal removal adenoma                            on last colonoscopy (< 3 yrs)                           Oct 2019 - multiple TA and SSP polyps, one removed                            liecemeal and one SSP with LGD Medicines:                Monitored Anesthesia Care Procedure:                Pre-Anesthesia Assessment:                           - Prior to the procedure, a History and Physical                            was performed, and patient medications and                            allergies were reviewed. The patient's tolerance of                            previous anesthesia was also reviewed. The risks                            and benefits of the procedure and the sedation                            options and risks were discussed with the patient.                            All questions were answered, and informed consent                            was obtained. Prior Anticoagulants: The patient has                            taken no previous anticoagulant or antiplatelet                            agents. ASA Grade Assessment: III - A patient with                            severe systemic disease. After reviewing the risks  and benefits, the patient was deemed in                            satisfactory condition to undergo the procedure.                           After obtaining informed consent, the colonoscope                            was passed under direct vision. Throughout the                            procedure, the patient's blood pressure, pulse, and                            oxygen saturations were monitored continuously.  The                            Colonoscope was introduced through the anus and                            advanced to the the cecum, identified by                            appendiceal orifice and ileocecal valve. The                            colonoscopy was somewhat difficult due to a                            tortuous colon. The patient tolerated the procedure                            well. The quality of the bowel preparation was                            good. The ileocecal valve, appendiceal orifice, and                            rectum were photographed.(ICV difficult to photo                            due to tortuous right colon anatomy) The bowel                            preparation used was Plenvu. Scope In: 61:60:73 AM Scope Out: 11:38:35 AM Scope Withdrawal Time: 0 hours 19 minutes 5 seconds  Total Procedure Duration: 0 hours 22 minutes 18 seconds  Findings:                 External hemorrhoids were found on perianal exam.                           An 8-10 mm polyp was found in the proximal  ascending colon. The polyp was sessile. The polyp                            was removed with a piecemeal technique using a cold                            snare. Resection was complete, and the polyp tissue                            was partially retrieved.                           A diminutive polyp was found in the rectum. The                            polyp was sessile. The polyp was removed with a                            cold snare. Resection and retrieval were complete.                           Multiple small-mouthed diverticula were found in                            the left colon.                           Internal hemorrhoids were found - partially                            prolapsed when visualized on forward view. Impression:               - Hemorrhoids found on perianal exam.                           - One 8 mm polyp in the  proximal ascending colon,                            removed piecemeal using a cold snare. Complete                            resection. Partial retrieval.                           - One diminutive polyp in the rectum, removed with                            a cold snare. Resected and retrieved.                           - Diverticulosis in the left colon.                           - Internal hemorrhoids. Recommendation:           -  Patient has a contact number available for                            emergencies. The signs and symptoms of potential                            delayed complications were discussed with the                            patient. Return to normal activities tomorrow.                            Written discharge instructions were provided to the                            patient.                           - Resume previous diet.                           - Continue present medications.                           - Await pathology results.                           - Repeat colonoscopy is recommended for                            surveillance. The colonoscopy date will be                            determined after pathology results from today's                            exam become available for review.                           - Refer to a colo-rectal surgeon for symptomatic                            and prominent internal and external hemorrhoids. Morrisa Aldaba L. Loletha Carrow, MD 12/22/2020 11:47:45 AM This report has been signed electronically.

## 2020-12-22 NOTE — Telephone Encounter (Signed)
Referral, records, demographic and insurance information faxed to [facility].

## 2020-12-26 ENCOUNTER — Telehealth: Payer: Self-pay

## 2020-12-26 NOTE — Telephone Encounter (Signed)
  Follow up Call-  Call back number 12/22/2020 07/02/2018  Post procedure Call Back phone  # [phone]  Permission to leave phone message Yes Yes  Some recent data might be hidden     Patient questions:  Do you have a fever, pain , or abdominal swelling? No. Pain Score  0 *  Have you tolerated food without any problems? Yes.    Have you been able to return to your normal activities? Yes.    Do you have any questions about your discharge instructions: Diet   No. Medications  No. Follow up visit  No.  Do you have questions or concerns about your Care? No.  Actions: * If pain score is 4 or above: No action needed, pain <4.  1. Have you developed a fever since your procedure? no  2.   Have you had an respiratory symptoms (SOB or cough) since your procedure? no  3.   Have you tested positive for COVID 19 since your procedure no  4.   Have you had any family members/close contacts diagnosed with the COVID 19 since your procedure?  no   If yes to any of these questions please route to Joylene John, RN and Joella Prince, RN

## 2020-12-28 ENCOUNTER — Encounter: Payer: Self-pay | Admitting: Gastroenterology

## 2020-12-29 ENCOUNTER — Other Ambulatory Visit: Payer: Self-pay | Admitting: Student

## 2020-12-29 ENCOUNTER — Other Ambulatory Visit (HOSPITAL_COMMUNITY): Payer: Self-pay | Admitting: Student

## 2020-12-29 DIAGNOSIS — M7062 Trochanteric bursitis, left hip: Secondary | ICD-10-CM | POA: Diagnosis not present

## 2021-01-03 ENCOUNTER — Encounter (HOSPITAL_COMMUNITY)
Admission: RE | Admit: 2021-01-03 | Discharge: 2021-01-03 | Disposition: A | Discharge status: Home / Self Care | Payer: Medicare Other | Source: Ambulatory Visit | Attending: Student | Admitting: Student

## 2021-01-03 ENCOUNTER — Ambulatory Visit (HOSPITAL_COMMUNITY)
Admission: RE | Admit: 2021-01-03 | Discharge: 2021-01-03 | Disposition: A | Discharge status: Home / Self Care | Payer: Medicare Other | Source: Ambulatory Visit | Attending: Student | Admitting: Student

## 2021-01-03 ENCOUNTER — Other Ambulatory Visit: Payer: Self-pay

## 2021-01-03 DIAGNOSIS — M7062 Trochanteric bursitis, left hip: Secondary | ICD-10-CM | POA: Diagnosis not present

## 2021-01-03 DIAGNOSIS — Z96643 Presence of artificial hip joint, bilateral: Secondary | ICD-10-CM | POA: Diagnosis not present

## 2021-01-03 DIAGNOSIS — M25552 Pain in left hip: Secondary | ICD-10-CM | POA: Diagnosis not present

## 2021-01-03 MED ORDER — TECHNETIUM TC 99M MEDRONATE IV KIT
20.0000 | PACK | Freq: Once | INTRAVENOUS | Status: AC | PRN
  Administered 2021-01-03: 21.3 via INTRAVENOUS

## 2021-01-06 ENCOUNTER — Other Ambulatory Visit: Payer: Self-pay | Admitting: Family Medicine

## 2021-01-16 ENCOUNTER — Encounter: Payer: Self-pay | Admitting: Family Medicine

## 2021-01-19 ENCOUNTER — Other Ambulatory Visit: Payer: Self-pay | Admitting: Family Medicine

## 2021-01-22 ENCOUNTER — Ambulatory Visit (HOSPITAL_BASED_OUTPATIENT_CLINIC_OR_DEPARTMENT_OTHER): Payer: Medicare Other | Attending: Orthopedic Surgery | Admitting: Physical Therapy

## 2021-01-22 ENCOUNTER — Other Ambulatory Visit: Payer: Self-pay

## 2021-01-22 ENCOUNTER — Encounter (HOSPITAL_BASED_OUTPATIENT_CLINIC_OR_DEPARTMENT_OTHER): Payer: Self-pay | Admitting: Physical Therapy

## 2021-01-22 DIAGNOSIS — M6281 Muscle weakness (generalized): Secondary | ICD-10-CM | POA: Diagnosis not present

## 2021-01-22 DIAGNOSIS — M25552 Pain in left hip: Secondary | ICD-10-CM | POA: Insufficient documentation

## 2021-01-22 DIAGNOSIS — R262 Difficulty in walking, not elsewhere classified: Secondary | ICD-10-CM | POA: Insufficient documentation

## 2021-01-22 NOTE — Therapy (Signed)
[facility] [place], [place], [address] Phone: [phone]   Fax:  [phone]  Physical Therapy Evaluation  Patient Details  Name: Tara Murphy MRN: [id number] Date of Birth: 1946-07-10 Referring Provider (PT): Gaynelle Arabian, MD   Encounter Date: 01/22/2021   PT End of Session - 01/22/21 1445    Visit Number 1    Number of Visits 17    Date for PT Re-Evaluation 03/22/21    Authorization Type MCR    Progress Note Due on Visit 10    PT Start Time 6237    PT Stop Time 1515    PT Time Calculation (min) 43 min    Activity Tolerance Patient tolerated treatment well    Behavior During Therapy [facility] for tasks assessed/performed           Past Medical History:  Diagnosis Date  . Allergy   . Arthritis   . 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HCC)    S/p total hip replacement left  . Breast cancer ([facility]) 2015   left   . Cataract   . Cholecystolithiasis   . Chronic allergic rhinitis   . Depression   . Former smoker, stopped smoking in distant past 10/05/2019   Quit 2000; 100 y smoking history  . GERD (gastroesophageal reflux disease)   . Hepatic steatosis 06/08/2020   Intermittent elevated LFTs and ultrasound 2018  . Hyperlipemia   . IBS (irritable bowel syndrome) 10/08/2018   Nl colonoscopy and EGD 09/2018  . IBS (irritable bowel syndrome)   . Lichen plano-pilaris   . 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 Neuropathy, peripheral, idiopathic    uses gabapentin  . Osteopenia 09/07/2019   Dexa 08/2019: T = -1.2 at wrist; back and hips excluded (DJD and hardware).   . Personal history of radiation therapy 2015  . 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 Repeat 2020  . Urinary tract infection    hx of frequent     Past Surgical History:  Procedure Laterality Date  . APPENDECTOMY    . BREAST LUMPECTOMY Left 2015  . cataracts    . CHOLECYSTECTOMY N/A  10/29/2016   Procedure: LAPAROSCOPIC CHOLECYSTECTOMY;  Surgeon: Erroll Luna, MD;  Location: [facility];  Service: General;  Laterality: N/A;  . COLONOSCOPY    . LIPOSUCTION    . TOTAL HIP ARTHROPLASTY     bilateral hip arthoplasty  . TOTAL HIP ARTHROPLASTY Right 12/18/2015   Procedure: RIGHT TOTAL HIP ARTHROPLASTY ANTERIOR APPROACH;  Surgeon: Gaynelle Arabian, MD;  Location: [facility];  Service: Orthopedics;  Laterality: Right;  . TOTAL KNEE ARTHROPLASTY    . TOTAL KNEE ARTHROPLASTY Left 11/25/2016   Procedure: LEFT TOTAL KNEE ARTHROPLASTY;  Surgeon: Gaynelle Arabian, MD;  Location: [facility];  Service: Orthopedics;  Laterality: Left;  with abductor block  . TUBAL LIGATION    . UPPER GASTROINTESTINAL ENDOSCOPY      There were no vitals filed for this visit.    Subjective Assessment - 01/22/21 1435    Subjective Really painful on post Lt hip when I stand and take the first step, gets better as she keeps walking. Pain into Lt groin with hip ER. Have developed back pain from walking funny. Have neuropathy in feet. Bil THA & TKA. For the last week I have had a lot of Lt shoulder pain. 3 episodes of shoulder spasm that almost takes me to my knees. Driving to [place] next week.  Patient Stated Goals decr pain, be able to sit and get back up.    Currently in Pain? Yes    Pain Score 5    when walking, about a .5 in sitting   Aggravating Factors  trning at night. get up to walk    Pain Relieving Factors walking, tramadol, gabapentin              [facility] PT Assessment - 01/22/21 0001      Assessment   Medical Diagnosis Lt hip pain    Referring Provider (PT) Gaynelle Arabian, MD    Onset Date/Surgical Date --   Feb 2022   Prior Therapy not this year      Precautions   Precautions Fall    Precaution Comments bil THA      Restrictions   Weight Bearing Restrictions No      Balance Screen   Has the patient fallen in the past 6 months No    Has the patient had a decrease in activity level because of a  fear of falling?  Yes    Is the patient reluctant to leave their home because of a fear of falling?  No      Home Ecologist residence    Living Arrangements Spouse/significant other    Additional Comments no stairs at home      Prior Function   Level of Independence Independent      Cognition   Overall Cognitive Status Within Functional Limits for tasks assessed      Sensation   Additional Comments numbness in bil great toes      Posture/Postural Control   Posture Comments ASIS elevation in standing      ROM / Strength   AROM / PROM / Strength AROM;Strength      AROM   Overall AROM Comments Lt knee ext -3      Strength   Strength Assessment Site Hip    Right/Left Hip Right;Left    Right Hip ABduction 4/5    Left Hip ABduction 3+/5      Palpation   Palpation comment tightness HSs, springy end feel in knee ext, tight piriformis      Ambulation/Gait   Gait Comments antalgic on Lt side; Lt foot lat to med strike, lacking arm swing & trunk rot                      Objective measurements completed on examination: See above findings.       [facility] [place] Adult PT Treatment/Exercise - 01/22/21 0001      Exercises   Exercises Knee/Hip      Knee/Hip Exercises: Stretches   Passive Hamstring Stretch Limitations seated EOB    Piriformis Stretch Limitations seated      Knee/Hip Exercises: Standing   Gait Training heel toe, trunk rotation, arm swing      Knee/Hip Exercises: Supine   Quad Sets Both;5 reps                  PT Education - 01/22/21 2038    Education Details anatomy of condition, POC, HEP, exercise form/rationale    Person(s) Educated Patient    Methods Explanation;Demonstration;Tactile cues;Verbal cues;Handout    Comprehension Verbalized understanding;Need further instruction;Returned demonstration;Verbal cues required;Tactile cues required            PT Short Term Goals - 01/22/21 2041      PT SHORT  TERM GOAL #1  Title Lt knee ext to 0    Baseline -3 at eval    Time 3    Period Weeks    Status New    Target Date 02/16/21      PT SHORT TERM GOAL #2   Title demo heel-toe gait pattern    Baseline lateral to medial at eval    Time 3    Period Weeks    Status New    Target Date 02/16/21             PT Long Term Goals - 01/22/21 2042      PT LONG TERM GOAL #1   Title bed mobility without incr hip pain    Baseline severe at eval    Time 8    Period Weeks    Status New    Target Date 03/22/21      PT LONG TERM GOAL #2   Title able to stand and begin walking minimal to no discomfort in hip    Baseline severe at eval    Time 8    Period Weeks    Status New    Target Date 03/22/21      PT LONG TERM GOAL #3   Title hip abd strength 5/5    Baseline see flowsheet    Time 8    Period Weeks    Status New    Target Date 03/22/21                  Plan - 01/22/21 1529    Clinical Impression Statement Pt presents to PT with complaints of Lt hip pain that began in Feb of insidious onset. She has a h/oAVN which resulted in Lt THA but has bil THA & TKA. Lacking full ext in Lt knee vs Rt, significant tightness in HS and piriformis with tendency toward a forward bent posture in standing. Weakness in bil hip abd with MMT today. Pt will be out of town until the 11th so will work on current HEP until that time. Encouraged to contact me with any further questions until that time. Will benefit from skilled PT to address deficits and meet functional goals.    Personal Factors and Comorbidities Comorbidity 3+    Comorbidities see comorbidities list in history    Examination-Activity Limitations Squat;Lift;Stairs;Bed Mobility;Bend;Stand;Locomotion Level;Transfers;Sit;Sleep    Examination-Participation Restrictions Cleaning;Shop;Meal Prep;Community Activity;Driving    Stability/Clinical Decision Making Stable/Uncomplicated    Clinical Decision Making Low    Rehab Potential Good     PT Frequency 2x / week    PT Duration 8 weeks    PT Treatment/Interventions ADLs/Self Care Home Management;Cryotherapy;Moist Heat;Electrical Stimulation;Stair training;Gait training;Functional mobility training;Neuromuscular re-education;Therapeutic exercise;Therapeutic activities;Patient/family education;Manual techniques;Passive range of motion;Dry needling;Taping;Aquatic Therapy    PT Next Visit Plan cont ext of Lt knee, glut strength, aquatic PT?    Consulted and Agree with Plan of Care Patient           Patient will benefit from skilled therapeutic intervention in order to improve the following deficits and impairments:  Abnormal gait,Decreased mobility,Difficulty walking,Increased muscle spasms,Improper body mechanics,Decreased range of motion,Decreased activity tolerance,Decreased strength,Impaired flexibility,Postural dysfunction,Pain  Visit Diagnosis: Pain in left hip - Plan: PT plan of care cert/re-cert  Difficulty in walking, not elsewhere classified - Plan: PT plan of care cert/re-cert  Muscle weakness (generalized) - Plan: PT plan of care cert/re-cert     Problem List Patient Active Problem List   Diagnosis Date Noted  . Hepatic steatosis 06/08/2020  .  Former smoker, stopped smoking in distant past 10/05/2019  . Osteopenia 09/07/2019  . IBS (irritable bowel syndrome) 10/08/2018  . 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 Hammer toe 04/13/2018  . Osteoarthritis of right foot 04/13/2018  . GERD (gastroesophageal reflux disease) 11/20/2017  . Familial hyperlipidemia 11/20/2017  . Lichen plano-pilaris 11/20/2017  . Restless leg syndrome 11/20/2017  . Essential tremor 09/26/2016  . OA (osteoarthritis) of knee 12/18/2015  . OA (osteoarthritis) of hip 12/18/2015  . Osteoarthritis of spine with radiculopathy, 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  . History of Clostridium difficile colitis 06/16/2015  . 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 Chronic allergic rhinitis 06/13/2015  . Neuropathy, peripheral, idiopathic 06/13/2015  . Recurrent urinary tract infection 06/13/2015  . History of left breast cancer 03/20/2015   Ashe Graybeal C. Alfredo Spong PT, DPT 01/22/21 8:53 PM   [facility] [address] [place], [place], [address] Phone: [phone]   Fax:  [phone]  Name: Tara Murphy MRN: [id number] Date of Birth: December 10, 1945

## 2021-01-31 ENCOUNTER — Ambulatory Visit (HOSPITAL_BASED_OUTPATIENT_CLINIC_OR_DEPARTMENT_OTHER): Payer: Medicare Other | Admitting: Physical Therapy

## 2021-01-31 ENCOUNTER — Encounter (HOSPITAL_BASED_OUTPATIENT_CLINIC_OR_DEPARTMENT_OTHER): Payer: Self-pay | Admitting: Physical Therapy

## 2021-01-31 ENCOUNTER — Other Ambulatory Visit: Payer: Self-pay

## 2021-01-31 DIAGNOSIS — M25552 Pain in left hip: Secondary | ICD-10-CM

## 2021-01-31 DIAGNOSIS — M6281 Muscle weakness (generalized): Secondary | ICD-10-CM

## 2021-01-31 DIAGNOSIS — R262 Difficulty in walking, not elsewhere classified: Secondary | ICD-10-CM

## 2021-01-31 NOTE — Therapy (Signed)
[place], [place], [address] Phone: [phone]   Fax:  [phone]  Physical Therapy Treatment  Patient Details  Name: Tara Murphy MRN: [id number] Date of Birth: 10-29-1945 Referring Provider (PT): Gaynelle Arabian, MD   Encounter Date: 01/31/2021   PT End of Session - 01/31/21 0932    Visit Number 2    Number of Visits 17    Date for PT Re-Evaluation 03/22/21    Authorization Type MCR    PT Start Time 0930    PT Stop Time 1013    PT Time Calculation (min) 43 min    Activity Tolerance Patient tolerated treatment well    Behavior During Therapy [facility] for tasks assessed/performed           Past Medical History:  Diagnosis Date  . Allergy   . Arthritis   . 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HCC)    S/p total hip replacement left  . Breast cancer ([place]) 2015   left   . Cataract   . Cholecystolithiasis   . Chronic allergic rhinitis   . Depression   . Former smoker, stopped smoking in distant past 10/05/2019   Quit 2000; 19 y smoking history  . GERD (gastroesophageal reflux disease)   . Hepatic steatosis 06/08/2020   Intermittent elevated LFTs and ultrasound 2018  . Hyperlipemia   . IBS (irritable bowel syndrome) 10/08/2018   Nl colonoscopy and EGD 09/2018  . IBS (irritable bowel syndrome)   . Lichen plano-pilaris   . 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 Neuropathy, peripheral, idiopathic    uses gabapentin  . Osteopenia 09/07/2019   Dexa 08/2019: T = -1.2 at wrist; back and hips excluded (DJD and hardware).   . Personal history of radiation therapy 2015  . 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 Repeat 2020  . Urinary tract infection    hx of frequent     Past Surgical History:  Procedure Laterality Date  . APPENDECTOMY    . BREAST LUMPECTOMY Left 2015  . cataracts    . CHOLECYSTECTOMY N/A 10/29/2016   Procedure: LAPAROSCOPIC  CHOLECYSTECTOMY;  Surgeon: Erroll Luna, MD;  Location: [place];  Service: General;  Laterality: N/A;  . COLONOSCOPY    . LIPOSUCTION    . TOTAL HIP ARTHROPLASTY     bilateral hip arthoplasty  . TOTAL HIP ARTHROPLASTY Right 12/18/2015   Procedure: RIGHT TOTAL HIP ARTHROPLASTY ANTERIOR APPROACH;  Surgeon: Gaynelle Arabian, MD;  [facility];  Service: Orthopedics;  Laterality: Right;  . TOTAL KNEE ARTHROPLASTY    . TOTAL KNEE ARTHROPLASTY Left 11/25/2016   Procedure: LEFT TOTAL KNEE ARTHROPLASTY;  Surgeon: Gaynelle Arabian, MD;  Location[facility];  Service: Orthopedics;  Laterality: Left;  with abductor block  . TUBAL LIGATION    . UPPER GASTROINTESTINAL ENDOSCOPY      There were no vitals filed for this visit.   Subjective Assessment - 01/31/21 0929    Subjective Pain levels did very well. got up and moved around a few times in the drive which took about 8 hrs. Walked a lot of steps and about 2 miles for the event and did well.    Patient Stated Goals decr pain, be able to sit and get back up.    Currently in Pain? Yes    Pain Score 3     Pain Location Hip    Pain Orientation Left    Pain  Descriptors / Indicators Sore    Aggravating Factors  rolling over at night    Pain Relieving Factors moving arond                             [facility] Adult PT Treatment/Exercise - 01/31/21 0001      Knee/Hip Exercises: Stretches   Passive Hamstring Stretch Both;30 seconds    Passive Hamstring Stretch Limitations by PT    Gastroc Stretch Both;2 reps;30 seconds    Gastroc Stretch Limitations slant board      Knee/Hip Exercises: Standing   Other Standing Knee Exercises hip hinge with glut set in // bars    Other Standing Knee Exercises wide tandem stance with glut set progressed to rocking with [id number] posture      Knee/Hip Exercises: Sidelying   Hip ABduction Limitations x20 ea    Clams x20 ea      Manual Therapy   Manual Therapy Soft tissue mobilization    Soft tissue  mobilization roller & IASTM Lt hip; prone roller Lt HS & gastroc                    PT Short Term Goals - 01/22/21 2041      PT SHORT TERM GOAL #1   Title Lt knee ext to 0    Baseline -3 at eval    Time 3    Period Weeks    Status New    Target Date 02/16/21      PT SHORT TERM GOAL #2   Title demo heel-toe gait pattern    Baseline lateral to medial at eval    Time 3    Period Weeks    Status New    Target Date 02/16/21             PT Long Term Goals - 01/22/21 2042      PT LONG TERM GOAL #1   Title bed mobility without incr hip pain    Baseline severe at eval    Time 8    Period Weeks    Status New    Target Date 03/22/21      PT LONG TERM GOAL #2   Title able to stand and begin walking minimal to no discomfort in hip    Baseline severe at eval    Time 8    Period Weeks    Status New    Target Date 03/22/21      PT LONG TERM GOAL #3   Title hip abd strength 5/5    Baseline see flowsheet    Time 8    Period Weeks    Status New    Target Date 03/22/21                 Plan - 01/31/21 1135    Clinical Impression Statement Significant trigger points in Lt glut med/min and TFL reduced with manual therapy today. tends to ambulate with short step length and reports this is becuase she cannot feel her feet well. trunk rotation and arm swing has improved overall balanace in gait.    PT Treatment/Interventions ADLs/Self Care Home Management;Cryotherapy;Moist Heat;Electrical Stimulation;Stair training;Gait training;Functional mobility training;Neuromuscular re-education;Therapeutic exercise;Therapeutic activities;Patient/family education;Manual techniques;Passive range of motion;Dry needling;Taping;Aquatic Therapy    PT Next Visit Plan balance, possible DN?    Consulted and Agree with Plan of Care Patient           Patient will  benefit from skilled therapeutic intervention in order to improve the following deficits and impairments:  Abnormal  gait,Decreased mobility,Difficulty walking,Increased muscle spasms,Improper body mechanics,Decreased range of motion,Decreased activity tolerance,Decreased strength,Impaired flexibility,Postural dysfunction,Pain  Visit Diagnosis: Pain in left hip  Difficulty in walking, not elsewhere classified  Muscle weakness (generalized)     Problem List Patient Active Problem List   Diagnosis Date Noted  . Hepatic steatosis 06/08/2020  . Former smoker, stopped smoking in distant past 10/05/2019  . Osteopenia 09/07/2019  . IBS (irritable bowel syndrome) 10/08/2018  . 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 Hammer toe 04/13/2018  . Osteoarthritis of right foot 04/13/2018  . GERD (gastroesophageal reflux disease) 11/20/2017  . Familial hyperlipidemia 11/20/2017  . Lichen plano-pilaris 11/20/2017  . Restless leg syndrome 11/20/2017  . Essential tremor 09/26/2016  . OA (osteoarthritis) of knee 12/18/2015  . OA (osteoarthritis) of hip 12/18/2015  . Osteoarthritis of spine with radiculopathy, 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  . History of Clostridium difficile colitis 06/16/2015  . 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 Chronic allergic rhinitis 06/13/2015  . Neuropathy, peripheral, idiopathic 06/13/2015  . Recurrent urinary tract infection 06/13/2015  . History of left breast cancer 03/20/2015    Betty Daidone C. Shakia Sebastiano PT, DPT 01/31/21 11:39 AM   [place], [place], [address] Phone: [phone]   Fax:  [phone]  Name: Tara Murphy MRN: [id number] Date of Birth: 22-May-1946

## 2021-02-02 ENCOUNTER — Other Ambulatory Visit: Payer: Self-pay

## 2021-02-02 ENCOUNTER — Encounter (HOSPITAL_BASED_OUTPATIENT_CLINIC_OR_DEPARTMENT_OTHER): Payer: Self-pay | Admitting: Physical Therapy

## 2021-02-02 ENCOUNTER — Ambulatory Visit (HOSPITAL_BASED_OUTPATIENT_CLINIC_OR_DEPARTMENT_OTHER): Payer: Medicare Other | Admitting: Physical Therapy

## 2021-02-02 DIAGNOSIS — R262 Difficulty in walking, not elsewhere classified: Secondary | ICD-10-CM

## 2021-02-02 DIAGNOSIS — M6281 Muscle weakness (generalized): Secondary | ICD-10-CM

## 2021-02-02 DIAGNOSIS — M25552 Pain in left hip: Secondary | ICD-10-CM

## 2021-02-02 NOTE — Therapy (Signed)
[place], [place], [address] Phone: [phone]   Fax:  [phone]  Physical Therapy Treatment  Patient Details  Name: Tara Murphy MRN: [id number] Date of Birth: 1946-07-31 Referring Provider (PT): Gaynelle Arabian, MD   Encounter Date: 02/02/2021   PT End of Session - 02/02/21 1603    Visit Number 3    Number of Visits 17    Date for PT Re-Evaluation 03/22/21    Authorization Type MCR    PT Start Time 1601    PT Stop Time 1639    PT Time Calculation (min) 38 min    Activity Tolerance Patient tolerated treatment well    Behavior During Therapy [facility] for tasks assessed/performed           Past Medical History:  Diagnosis Date  . Allergy   . Arthritis   . 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HCC)    S/p total hip replacement left  . Breast cancer ([place]) 2015   left   . Cataract   . Cholecystolithiasis   . Chronic allergic rhinitis   . Depression   . Former smoker, stopped smoking in distant past 10/05/2019   Quit 2000; 28 y smoking history  . GERD (gastroesophageal reflux disease)   . Hepatic steatosis 06/08/2020   Intermittent elevated LFTs and ultrasound 2018  . Hyperlipemia   . IBS (irritable bowel syndrome) 10/08/2018   Nl colonoscopy and EGD 09/2018  . IBS (irritable bowel syndrome)   . Lichen plano-pilaris   . 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 Neuropathy, peripheral, idiopathic    uses gabapentin  . Osteopenia 09/07/2019   Dexa 08/2019: T = -1.2 at wrist; back and hips excluded (DJD and hardware).   . Personal history of radiation therapy 2015  . 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 Repeat 2020  . Urinary tract infection    hx of frequent     Past Surgical History:  Procedure Laterality Date  . APPENDECTOMY    . BREAST LUMPECTOMY Left 2015  . cataracts    . CHOLECYSTECTOMY N/A 10/29/2016   Procedure: LAPAROSCOPIC  CHOLECYSTECTOMY;  Surgeon: Erroll Luna, MD;  Location: [place];  Service: General;  Laterality: N/A;  . COLONOSCOPY    . LIPOSUCTION    . TOTAL HIP ARTHROPLASTY     bilateral hip arthoplasty  . TOTAL HIP ARTHROPLASTY Right 12/18/2015   Procedure: RIGHT TOTAL HIP ARTHROPLASTY ANTERIOR APPROACH;  Surgeon: Gaynelle Arabian, MD;  [facility];  Service: Orthopedics;  Laterality: Right;  . TOTAL KNEE ARTHROPLASTY    . TOTAL KNEE ARTHROPLASTY Left 11/25/2016   Procedure: LEFT TOTAL KNEE ARTHROPLASTY;  Surgeon: Gaynelle Arabian, MD;  [facility];  Service: Orthopedics;  Laterality: Left;  with abductor block  . TUBAL LIGATION    . UPPER GASTROINTESTINAL ENDOSCOPY      There were no vitals filed for this visit.                      [place] Adult PT Treatment/Exercise - 02/02/21 0001      Knee/Hip Exercises: Stretches   Passive Hamstring Stretch Limitations seated EOB    Gastroc Stretch Both;2 reps;30 seconds    Gastroc Stretch Limitations slant board    Other Knee/Hip Stretches supine pull across      Knee/Hip Exercises: Standing   SLS bil with // available as needed    Gait Training trunk rotation  and arm swing    Other Standing Knee Exercises hip hinge- 10lb kettle bell    Other Standing Knee Exercises tandem with head turns, EC NBOS & neutral      Knee/Hip Exercises: Seated   Sit to Sand 15 reps;without UE support      Manual Therapy   Soft tissue mobilization roller Lt hip & VL                    PT Short Term Goals - 01/22/21 2041      PT SHORT TERM GOAL #1   Title Lt knee ext to 0    Baseline -3 at eval    Time 3    Period Weeks    Status New    Target Date 02/16/21      PT SHORT TERM GOAL #2   Title demo heel-toe gait pattern    Baseline lateral to medial at eval    Time 3    Period Weeks    Status New    Target Date 02/16/21             PT Long Term Goals - 01/22/21 2042      PT LONG TERM GOAL #1   Title bed mobility without  incr hip pain    Baseline severe at eval    Time 8    Period Weeks    Status New    Target Date 03/22/21      PT LONG TERM GOAL #2   Title able to stand and begin walking minimal to no discomfort in hip    Baseline severe at eval    Time 8    Period Weeks    Status New    Target Date 03/22/21      PT LONG TERM GOAL #3   Title hip abd strength 5/5    Baseline see flowsheet    Time 8    Period Weeks    Status New    Target Date 03/22/21                 Plan - 02/02/21 1637    Clinical Impression Statement Gait pattern is significantly improved. Tightness noted in hip again today and reduced with STM. able to perform sit to stands without UE and good control. does require further balance challenges for safety due to lack of sensation in her feet.    PT Treatment/Interventions ADLs/Self Care Home Management;Cryotherapy;Moist Heat;Electrical Stimulation;Stair training;Gait training;Functional mobility training;Neuromuscular re-education;Therapeutic exercise;Therapeutic activities;Patient/family education;Manual techniques;Passive range of motion;Dry needling;Taping;Aquatic Therapy    PT Next Visit Plan cont balance challenges    PT Home Exercise Plan [email or web address]    Consulted and Agree with Plan of Care Patient           Patient will benefit from skilled therapeutic intervention in order to improve the following deficits and impairments:  Abnormal gait,Decreased mobility,Difficulty walking,Increased muscle spasms,Improper body mechanics,Decreased range of motion,Decreased activity tolerance,Decreased strength,Impaired flexibility,Postural dysfunction,Pain  Visit Diagnosis: Pain in left hip  Difficulty in walking, not elsewhere classified  Muscle weakness (generalized)     Problem List Patient Active Problem List   Diagnosis Date Noted  . Hepatic steatosis 06/08/2020  . Former smoker, stopped smoking in distant past 10/05/2019  . Osteopenia 09/07/2019  . IBS  (irritable bowel syndrome) 10/08/2018  . 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 Hammer toe 04/13/2018  . Osteoarthritis of right foot 04/13/2018  . GERD (gastroesophageal reflux disease) 11/20/2017  . Familial  hyperlipidemia 11/20/2017  . Lichen plano-pilaris 11/20/2017  . Restless leg syndrome 11/20/2017  . Essential tremor 09/26/2016  . OA (osteoarthritis) of knee 12/18/2015  . OA (osteoarthritis) of hip 12/18/2015  . Osteoarthritis of spine with radiculopathy, 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  . History of Clostridium difficile colitis 06/16/2015  . 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 Chronic allergic rhinitis 06/13/2015  . Neuropathy, peripheral, idiopathic 06/13/2015  . Recurrent urinary tract infection 06/13/2015  . History of left breast cancer 03/20/2015   Ronelle Smallman C. Ryszard Socarras PT, DPT 02/02/21 4:52 PM   [facility] [place], [place], [address] Phone: [phone]   Fax:  [phone]  Name: Lynel Forester MRN: [id number] Date of Birth: Dec 19, 1945

## 2021-02-07 ENCOUNTER — Ambulatory Visit (HOSPITAL_BASED_OUTPATIENT_CLINIC_OR_DEPARTMENT_OTHER): Payer: Medicare Other | Admitting: Physical Therapy

## 2021-02-07 ENCOUNTER — Other Ambulatory Visit: Payer: Self-pay

## 2021-02-07 DIAGNOSIS — M25552 Pain in left hip: Secondary | ICD-10-CM

## 2021-02-07 DIAGNOSIS — R262 Difficulty in walking, not elsewhere classified: Secondary | ICD-10-CM

## 2021-02-07 DIAGNOSIS — M6281 Muscle weakness (generalized): Secondary | ICD-10-CM

## 2021-02-07 NOTE — Therapy (Signed)
[place], [place], [address] Phone: [phone]   Fax:  [phone]  Physical Therapy Treatment  Patient Details  Name: Tara Murphy MRN: [id number] Date of Birth: 11-13-1945 Referring Provider (PT): Gaynelle Arabian, MD   Encounter Date: 02/07/2021   PT End of Session - 02/07/21 1021    Visit Number 4    Number of Visits 17    Date for PT Re-Evaluation 03/22/21    Authorization Type MCR    PT Start Time 1020    PT Stop Time 1058    PT Time Calculation (min) 38 min    Activity Tolerance Patient tolerated treatment well    Behavior During Therapy [facility] for tasks assessed/performed           Past Medical History:  Diagnosis Date  . Allergy   . Arthritis   . 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HCC)    S/p total hip replacement left  . Breast cancer ([place]) 2015   left   . Cataract   . Cholecystolithiasis   . Chronic allergic rhinitis   . Depression   . Former smoker, stopped smoking in distant past 10/05/2019   Quit 2000; 80 y smoking history  . GERD (gastroesophageal reflux disease)   . Hepatic steatosis 06/08/2020   Intermittent elevated LFTs and ultrasound 2018  . Hyperlipemia   . IBS (irritable bowel syndrome) 10/08/2018   Nl colonoscopy and EGD 09/2018  . IBS (irritable bowel syndrome)   . Lichen plano-pilaris   . 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 Neuropathy, peripheral, idiopathic    uses gabapentin  . Osteopenia 09/07/2019   Dexa 08/2019: T = -1.2 at wrist; back and hips excluded (DJD and hardware).   . Personal history of radiation therapy 2015  . 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 Repeat 2020  . Urinary tract infection    hx of frequent     Past Surgical History:  Procedure Laterality Date  . APPENDECTOMY    . BREAST LUMPECTOMY Left 2015  . cataracts    . CHOLECYSTECTOMY N/A 10/29/2016   Procedure: LAPAROSCOPIC  CHOLECYSTECTOMY;  Surgeon: Erroll Luna, MD;  Location: [place];  Service: General;  Laterality: N/A;  . COLONOSCOPY    . LIPOSUCTION    . TOTAL HIP ARTHROPLASTY     bilateral hip arthoplasty  . TOTAL HIP ARTHROPLASTY Right 12/18/2015   Procedure: RIGHT TOTAL HIP ARTHROPLASTY ANTERIOR APPROACH;  Surgeon: Gaynelle Arabian, MD;  [facility];  Service: Orthopedics;  Laterality: Right;  . TOTAL KNEE ARTHROPLASTY    . TOTAL KNEE ARTHROPLASTY Left 11/25/2016   Procedure: LEFT TOTAL KNEE ARTHROPLASTY;  Surgeon: Gaynelle Arabian, MD;  [facility];  Service: Orthopedics;  Laterality: Left;  with abductor block  . TUBAL LIGATION    . UPPER GASTROINTESTINAL ENDOSCOPY      There were no vitals filed for this visit.   Subjective Assessment - 02/07/21 1021    Subjective Did a lot of repotting and cleaning yesterday so I felt it in my Lt hip. I feel kind of off balance today.    Patient Stated Goals decr pain, be able to sit and get back up.    Currently in Pain? Yes    Pain Score 2     Pain Location Hip    Pain Orientation Left    Pain Descriptors / Indicators Sore    Aggravating Factors  worsening as day  goes on                             [facility] Adult PT Treatment/Exercise - 02/07/21 0001      Knee/Hip Exercises: Aerobic   Nustep 5 min L7 UE & LE      Knee/Hip Exercises: Standing   Functional Squat Limitations hip hinge to squat-3lb bar to floor    Other Standing Knee Exercises slow sit to table- cues for form    Other Standing Knee Exercises step to bosu fwd & side                    PT Short Term Goals - 01/22/21 2041      PT SHORT TERM GOAL #1   Title Lt knee ext to 0    Baseline -3 at eval    Time 3    Period Weeks    Status New    Target Date 02/16/21      PT SHORT TERM GOAL #2   Title demo heel-toe gait pattern    Baseline lateral to medial at eval    Time 3    Period Weeks    Status New    Target Date 02/16/21             PT Long  Term Goals - 01/22/21 2042      PT LONG TERM GOAL #1   Title bed mobility without incr hip pain    Baseline severe at eval    Time 8    Period Weeks    Status New    Target Date 03/22/21      PT LONG TERM GOAL #2   Title able to stand and begin walking minimal to no discomfort in hip    Baseline severe at eval    Time 8    Period Weeks    Status New    Target Date 03/22/21      PT LONG TERM GOAL #3   Title hip abd strength 5/5    Baseline see flowsheet    Time 8    Period Weeks    Status New    Target Date 03/22/21                 Plan - 02/07/21 2042    Clinical Impression Statement Did not do as much walking today as she was feeling off balance. used parallel bars and stepping to bosu for awareness of ankle/foot control.    PT Treatment/Interventions ADLs/Self Care Home Management;Cryotherapy;Moist Heat;Electrical Stimulation;Stair training;Gait training;Functional mobility training;Neuromuscular re-education;Therapeutic exercise;Therapeutic activities;Patient/family education;Manual techniques;Passive range of motion;Dry needling;Taping;Aquatic Therapy    PT Next Visit Plan cont balance challenges    PT Home Exercise Plan [email or web address]    Consulted and Agree with Plan of Care Patient           Patient will benefit from skilled therapeutic intervention in order to improve the following deficits and impairments:  Abnormal gait,Decreased mobility,Difficulty walking,Increased muscle spasms,Improper body mechanics,Decreased range of motion,Decreased activity tolerance,Decreased strength,Impaired flexibility,Postural dysfunction,Pain  Visit Diagnosis: Pain in left hip  Difficulty in walking, not elsewhere classified  Muscle weakness (generalized)     Problem List Patient Active Problem List   Diagnosis Date Noted  . Hepatic steatosis 06/08/2020  . Former smoker, stopped smoking in distant past 10/05/2019  . Osteopenia 09/07/2019  . IBS (irritable bowel  syndrome) 10/08/2018  . 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  Hammer toe 04/13/2018  . Osteoarthritis of right foot 04/13/2018  . GERD (gastroesophageal reflux disease) 11/20/2017  . Familial hyperlipidemia 11/20/2017  . Lichen plano-pilaris 11/20/2017  . Restless leg syndrome 11/20/2017  . Essential tremor 09/26/2016  . OA (osteoarthritis) of knee 12/18/2015  . OA (osteoarthritis) of hip 12/18/2015  . Osteoarthritis of spine with radiculopathy, 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  . History of Clostridium difficile colitis 06/16/2015  . 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 Chronic allergic rhinitis 06/13/2015  . Neuropathy, peripheral, idiopathic 06/13/2015  . Recurrent urinary tract infection 06/13/2015  . History of left breast cancer 03/20/2015    Srinidhi Landers C. Brenen Beigel PT, DPT 02/07/21 8:53 PM   [facility] [address] [place], [place], [address] Phone: [phone]   Fax:  [phone]  Name: Tara Murphy MRN: [id number] Date of Birth: 1946/07/29

## 2021-02-09 ENCOUNTER — Other Ambulatory Visit: Payer: Self-pay

## 2021-02-09 ENCOUNTER — Encounter (HOSPITAL_BASED_OUTPATIENT_CLINIC_OR_DEPARTMENT_OTHER): Payer: Self-pay | Admitting: Physical Therapy

## 2021-02-09 ENCOUNTER — Ambulatory Visit (HOSPITAL_BASED_OUTPATIENT_CLINIC_OR_DEPARTMENT_OTHER): Payer: Medicare Other | Admitting: Physical Therapy

## 2021-02-09 DIAGNOSIS — R262 Difficulty in walking, not elsewhere classified: Secondary | ICD-10-CM

## 2021-02-09 DIAGNOSIS — M6281 Muscle weakness (generalized): Secondary | ICD-10-CM | POA: Diagnosis not present

## 2021-02-09 DIAGNOSIS — M25552 Pain in left hip: Secondary | ICD-10-CM | POA: Diagnosis not present

## 2021-02-09 NOTE — Therapy (Signed)
[place], [place], [address] Phone: [phone]   Fax:  [phone]  Physical Therapy Treatment  Patient Details  Name: Tara Murphy MRN: [id number] Date of Birth: October 10, 1945 Referring Provider (PT): Gaynelle Arabian, MD   Encounter Date: 02/09/2021   PT End of Session - 02/09/21 1107    Visit Number 5    Number of Visits 17    Date for PT Re-Evaluation 03/22/21    Authorization Type MCR    PT Start Time 1103    PT Stop Time 1141    PT Time Calculation (min) 38 min    Activity Tolerance Patient tolerated treatment well    Behavior During Therapy [facility] for tasks assessed/performed           Past Medical History:  Diagnosis Date  . Allergy   . Arthritis   . 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HCC)    S/p total hip replacement left  . Breast cancer ([place]) 2015   left   . Cataract   . Cholecystolithiasis   . Chronic allergic rhinitis   . Depression   . Former smoker, stopped smoking in distant past 10/05/2019   Quit 2000; 31 y smoking history  . GERD (gastroesophageal reflux disease)   . Hepatic steatosis 06/08/2020   Intermittent elevated LFTs and ultrasound 2018  . Hyperlipemia   . IBS (irritable bowel syndrome) 10/08/2018   Nl colonoscopy and EGD 09/2018  . IBS (irritable bowel syndrome)   . Lichen plano-pilaris   . 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 Neuropathy, peripheral, idiopathic    uses gabapentin  . Osteopenia 09/07/2019   Dexa 08/2019: T = -1.2 at wrist; back and hips excluded (DJD and hardware).   . Personal history of radiation therapy 2015  . 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 Repeat 2020  . Urinary tract infection    hx of frequent     Past Surgical History:  Procedure Laterality Date  . APPENDECTOMY    . BREAST LUMPECTOMY Left 2015  . cataracts    . CHOLECYSTECTOMY N/A 10/29/2016   Procedure: LAPAROSCOPIC  CHOLECYSTECTOMY;  Surgeon: Erroll Luna, MD;  Location: [place];  Service: General;  Laterality: N/A;  . COLONOSCOPY    . LIPOSUCTION    . TOTAL HIP ARTHROPLASTY     bilateral hip arthoplasty  . TOTAL HIP ARTHROPLASTY Right 12/18/2015   Procedure: RIGHT TOTAL HIP ARTHROPLASTY ANTERIOR APPROACH;  Surgeon: Gaynelle Arabian, MD;  Location[facility];  Service: Orthopedics;  Laterality: Right;  . TOTAL KNEE ARTHROPLASTY    . TOTAL KNEE ARTHROPLASTY Left 11/25/2016   Procedure: LEFT TOTAL KNEE ARTHROPLASTY;  Surgeon: Gaynelle Arabian, MD;  Location: [facility];  Service: Orthopedics;  Laterality: Left;  with abductor block  . TUBAL LIGATION    . UPPER GASTROINTESTINAL ENDOSCOPY      There were no vitals filed for this visit.   Subjective Assessment - 02/09/21 1106    Subjective hip is about a 2.5 and my lower back is giving me a fit this AM. My endurance is lacking.    Patient Stated Goals decr pain, be able to sit and get back up.    Currently in Pain? Yes    Pain Score 3     Pain Location Hip    Pain Orientation Left    Pain Descriptors / Indicators Sore  [facility] Adult PT Treatment/Exercise - 02/09/21 0001      Knee/Hip Exercises: Stretches   Passive Hamstring Stretch Limitations seated EOB    Piriformis Stretch Both;30 seconds    Other Knee/Hip Stretches LTR      Knee/Hip Exercises: Supine   Straight Leg Raises Limitations with cues for core tight/flat back    Other Supine Knee/Hip Exercises post pelvic tilt    Other Supine Knee/Hip Exercises hooklying march      Manual Therapy   Manual Therapy Joint mobilization    Joint Mobilization thoracic PA    Soft tissue mobilization thoraco lumbar paraspinals, bil QL, Lt piriformis; IASTM Lt HS                  PT Education - 02/09/21 1243    Education Details walking program    Person(s) Educated Patient    Methods Explanation    Comprehension Verbalized understanding;Need further instruction             PT Short Term Goals - 01/22/21 2041      PT SHORT TERM GOAL #1   Title Lt knee ext to 0    Baseline -3 at eval    Time 3    Period Weeks    Status New    Target Date 02/16/21      PT SHORT TERM GOAL #2   Title demo heel-toe gait pattern    Baseline lateral to medial at eval    Time 3    Period Weeks    Status New    Target Date 02/16/21             PT Long Term Goals - 01/22/21 2042      PT LONG TERM GOAL #1   Title bed mobility without incr hip pain    Baseline severe at eval    Time 8    Period Weeks    Status New    Target Date 03/22/21      PT LONG TERM GOAL #2   Title able to stand and begin walking minimal to no discomfort in hip    Baseline severe at eval    Time 8    Period Weeks    Status New    Target Date 03/22/21      PT LONG TERM GOAL #3   Title hip abd strength 5/5    Baseline see flowsheet    Time 8    Period Weeks    Status New    Target Date 03/22/21                 Plan - 02/09/21 1238    Clinical Impression Statement Pt felt tightness in lower back today but presented incr kyphosis with significant tension and limited mobility in thoracic spine- when addressed, decr LBP. Asked that she do her longer walks every other day with shorter walks and focus on stretching/strengthening in between. Lack of full ext in Lt knee also contributing to hip/back pain.    PT Treatment/Interventions ADLs/Self Care Home Management;Cryotherapy;Moist Heat;Electrical Stimulation;Stair training;Gait training;Functional mobility training;Neuromuscular re-education;Therapeutic exercise;Therapeutic activities;Patient/family education;Manual techniques;Passive range of motion;Dry needling;Taping;Aquatic Therapy    PT Next Visit Plan cont balance challenges    PT Home Exercise Plan [id number]    Consulted and Agree with Plan of Care Patient           Patient will benefit from skilled therapeutic intervention in order to improve the following  deficits and impairments:  Abnormal gait,Decreased  mobility,Difficulty walking,Increased muscle spasms,Improper body mechanics,Decreased range of motion,Decreased activity tolerance,Decreased strength,Impaired flexibility,Postural dysfunction,Pain  Visit Diagnosis: Pain in left hip  Difficulty in walking, not elsewhere classified  Muscle weakness (generalized)     Problem List Patient Active Problem List   Diagnosis Date Noted  . Hepatic steatosis 06/08/2020  . Former smoker, stopped smoking in distant past 10/05/2019  . Osteopenia 09/07/2019  . IBS (irritable bowel syndrome) 10/08/2018  . 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 Hammer toe 04/13/2018  . Osteoarthritis of right foot 04/13/2018  . GERD (gastroesophageal reflux disease) 11/20/2017  . Familial hyperlipidemia 11/20/2017  . Lichen plano-pilaris 11/20/2017  . Restless leg syndrome 11/20/2017  . Essential tremor 09/26/2016  . OA (osteoarthritis) of knee 12/18/2015  . OA (osteoarthritis) of hip 12/18/2015  . Osteoarthritis of spine with radiculopathy, 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  . History of Clostridium difficile colitis 06/16/2015  . 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 Chronic allergic rhinitis 06/13/2015  . Neuropathy, peripheral, idiopathic 06/13/2015  . Recurrent urinary tract infection 06/13/2015  . History of left breast cancer 03/20/2015   Aiman Sonn C. Imanii Gosdin PT, DPT 02/09/21 12:43 PM   [facility] [address] [place], [place], [address] Phone: [phone]   Fax:  [phone]  Name: Tara Murphy MRN: [id number] Date of Birth: 1946/07/28

## 2021-02-12 ENCOUNTER — Ambulatory Visit (HOSPITAL_BASED_OUTPATIENT_CLINIC_OR_DEPARTMENT_OTHER): Payer: Medicare Other | Admitting: Physical Therapy

## 2021-02-14 ENCOUNTER — Encounter (HOSPITAL_BASED_OUTPATIENT_CLINIC_OR_DEPARTMENT_OTHER): Payer: Medicare Other | Admitting: Physical Therapy

## 2021-02-23 ENCOUNTER — Other Ambulatory Visit: Payer: Self-pay

## 2021-02-23 ENCOUNTER — Ambulatory Visit (HOSPITAL_BASED_OUTPATIENT_CLINIC_OR_DEPARTMENT_OTHER): Payer: Medicare Other | Attending: Orthopedic Surgery | Admitting: Physical Therapy

## 2021-02-23 DIAGNOSIS — R262 Difficulty in walking, not elsewhere classified: Secondary | ICD-10-CM | POA: Insufficient documentation

## 2021-02-23 DIAGNOSIS — M6281 Muscle weakness (generalized): Secondary | ICD-10-CM | POA: Insufficient documentation

## 2021-02-23 DIAGNOSIS — M25552 Pain in left hip: Secondary | ICD-10-CM | POA: Insufficient documentation

## 2021-02-23 NOTE — Therapy (Signed)
[place], [place], [address] Phone: [phone]   Fax:  [phone]  Physical Therapy Treatment  Patient Details  Name: Tara Murphy MRN: [id number] Date of Birth: 1946-03-25 Referring Provider (PT): Gaynelle Arabian, MD   Encounter Date: 02/23/2021   PT End of Session - 02/23/21 1109    Visit Number 6    Number of Visits 17    Date for PT Re-Evaluation 03/22/21    Authorization Type MCR    Progress Note Due on Visit 10    PT Start Time 1105    PT Stop Time 1145    PT Time Calculation (min) 40 min    Activity Tolerance Patient tolerated treatment well           Past Medical History:  Diagnosis Date  . Allergy   . Arthritis   . 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HCC)    S/p total hip replacement left  . Breast cancer ([place]) 2015   left   . Cataract   . Cholecystolithiasis   . Chronic allergic rhinitis   . Depression   . Former smoker, stopped smoking in distant past 10/05/2019   Quit 2000; 54 y smoking history  . GERD (gastroesophageal reflux disease)   . Hepatic steatosis 06/08/2020   Intermittent elevated LFTs and ultrasound 2018  . Hyperlipemia   . IBS (irritable bowel syndrome) 10/08/2018   Nl colonoscopy and EGD 09/2018  . IBS (irritable bowel syndrome)   . Lichen plano-pilaris   . 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 Neuropathy, peripheral, idiopathic    uses gabapentin  . Osteopenia 09/07/2019   Dexa 08/2019: T = -1.2 at wrist; back and hips excluded (DJD and hardware).   . Personal history of radiation therapy 2015  . 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 Repeat 2020  . Urinary tract infection    hx of frequent     Past Surgical History:  Procedure Laterality Date  . APPENDECTOMY    . BREAST LUMPECTOMY Left 2015  . cataracts    . CHOLECYSTECTOMY N/A 10/29/2016   Procedure: LAPAROSCOPIC CHOLECYSTECTOMY;  Surgeon:  Erroll Luna, MD;  Location: [facility];  Service: General;  Laterality: N/A;  . COLONOSCOPY    . LIPOSUCTION    . TOTAL HIP ARTHROPLASTY     bilateral hip arthoplasty  . TOTAL HIP ARTHROPLASTY Right 12/18/2015   Procedure: RIGHT TOTAL HIP ARTHROPLASTY ANTERIOR APPROACH;  Surgeon: Gaynelle Arabian, MD;  [facility];  Service: Orthopedics;  Laterality: Right;  . TOTAL KNEE ARTHROPLASTY    . TOTAL KNEE ARTHROPLASTY Left 11/25/2016   Procedure: LEFT TOTAL KNEE ARTHROPLASTY;  Surgeon: Gaynelle Arabian, MD;  [facility];  Service: Orthopedics;  Laterality: Left;  with abductor block  . TUBAL LIGATION    . UPPER GASTROINTESTINAL ENDOSCOPY      There were no vitals filed for this visit.   Subjective Assessment - 02/23/21 1106    Subjective I did a long drive and back and staying at a cabin with lots of steps. Been standing and cooking a lot having a lot of company. Did not have a lot of time to do exercises/stretches.    Currently in Pain? Yes    Pain Score 5     Pain Location Hip    Pain Orientation Left    Pain Descriptors / Indicators Sore    Aggravating Factors  standing and worse as day goes  on    Pain Relieving Factors stretches, DN, moving around              [facility] PT Assessment - 02/23/21 0001      Palpation   Palpation comment functional LLD- Rt shorter                         [facility] Adult PT Treatment/Exercise - 02/23/21 0001      Knee/Hip Exercises: Supine   Other Supine Knee/Hip Exercises ab set wtih LE adduction      Manual Therapy   Manual Therapy Muscle Energy Technique    Manual therapy comments skilled palpation and monitoring during TPDN    Soft tissue mobilization Rt hip    Muscle Energy Technique Lt hip flexors/Rt extensors            Trigger Point Dry Needling - 02/23/21 0001    Consent Given? Yes    Education Handout Provided --   verbal education   Muscles Treated Back/Hip Tensor fascia lata;Piriformis    Piriformis Response Twitch  response elicited;Palpable increased muscle length   Lt   Tensor Fascia Lata Response Twitch response elicited;Palpable increased muscle length   Lt               PT Education - 02/23/21 1359    Education Details DN, anatomy of condition    Person(s) Educated Patient    Methods Explanation;Demonstration;Verbal cues    Comprehension Verbalized understanding;Need further instruction;Returned demonstration;Verbal cues required            PT Short Term Goals - 02/23/21 1154      PT SHORT TERM GOAL #1   Title Lt knee ext to 0    Baseline will measure at next viist.      PT SHORT TERM GOAL #2   Title demo heel-toe gait pattern    Status Achieved             PT Long Term Goals - 01/22/21 2042      PT LONG TERM GOAL #1   Title bed mobility without incr hip pain    Baseline severe at eval    Time 8    Period Weeks    Status New    Target Date 03/22/21      PT LONG TERM GOAL #2   Title able to stand and begin walking minimal to no discomfort in hip    Baseline severe at eval    Time 8    Period Weeks    Status New    Target Date 03/22/21      PT LONG TERM GOAL #3   Title hip abd strength 5/5    Baseline see flowsheet    Time 8    Period Weeks    Status New    Target Date 03/22/21                 Plan - 02/23/21 1355    Clinical Impression Statement Pt arrived in incr pain in hip and low back after [place] and [employer] with a lot of standing to cook. Noted pelvic rotation that was quickly resolved with one round of MET. DN to piriformis and TFL decr concordant pain at [facility]a- noted to have bruising just coronal to greater trochanter but she is unsure how it happened.    PT Treatment/Interventions ADLs/Self Care Home Management;Cryotherapy;Moist  Heat;Electrical Stimulation;Stair training;Gait training;Functional mobility training;Neuromuscular re-education;Therapeutic exercise;Therapeutic  activities;Patient/family education;Manual techniques;Passive range of motion;Dry needling;Taping;Aquatic Therapy    PT Next Visit Plan meas knee extension    PT Home Exercise Plan [id number]    Consulted and Agree with Plan of Care Patient           Patient will benefit from skilled therapeutic intervention in order to improve the following deficits and impairments:  Abnormal gait,Decreased mobility,Difficulty walking,Increased muscle spasms,Improper body mechanics,Decreased range of motion,Decreased activity tolerance,Decreased strength,Impaired flexibility,Postural dysfunction,Pain  Visit Diagnosis: Pain in left hip  Difficulty in walking, not elsewhere classified  Muscle weakness (generalized)     Problem List Patient Active Problem List   Diagnosis Date Noted  . Hepatic steatosis 06/08/2020  . Former smoker, stopped smoking in distant past 10/05/2019  . Osteopenia 09/07/2019  . IBS (irritable bowel syndrome) 10/08/2018  . 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 Hammer toe 04/13/2018  . Osteoarthritis of right foot 04/13/2018  . GERD (gastroesophageal reflux disease) 11/20/2017  . Familial hyperlipidemia 11/20/2017  . Lichen plano-pilaris 11/20/2017  . Restless leg syndrome 11/20/2017  . Essential tremor 09/26/2016  . OA (osteoarthritis) of knee 12/18/2015  . OA (osteoarthritis) of hip 12/18/2015  . Osteoarthritis of spine with radiculopathy, 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  . History of Clostridium difficile colitis 06/16/2015  . 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 Chronic allergic rhinitis 06/13/2015  . Neuropathy, peripheral, idiopathic 06/13/2015  . Recurrent urinary tract infection 06/13/2015  . History of left breast cancer 03/20/2015    Jessica C. Hightower PT, DPT 02/23/21 2:00 PM   [facility] [place], [place], [address] Phone:  [phone]   Fax:  [phone]  Name: Tara Murphy MRN: [id number] Date of Birth: Jun 24, 1946

## 2021-02-28 ENCOUNTER — Encounter (HOSPITAL_BASED_OUTPATIENT_CLINIC_OR_DEPARTMENT_OTHER): Payer: Self-pay | Admitting: Physical Therapy

## 2021-02-28 ENCOUNTER — Other Ambulatory Visit: Payer: Self-pay

## 2021-02-28 ENCOUNTER — Ambulatory Visit (HOSPITAL_BASED_OUTPATIENT_CLINIC_OR_DEPARTMENT_OTHER): Payer: Medicare Other | Admitting: Physical Therapy

## 2021-02-28 DIAGNOSIS — R262 Difficulty in walking, not elsewhere classified: Secondary | ICD-10-CM

## 2021-02-28 DIAGNOSIS — M6281 Muscle weakness (generalized): Secondary | ICD-10-CM | POA: Diagnosis not present

## 2021-02-28 DIAGNOSIS — M25552 Pain in left hip: Secondary | ICD-10-CM

## 2021-02-28 NOTE — Therapy (Signed)
[place], [place], [address] Phone: [phone]   Fax:  [phone]  Physical Therapy Treatment  Patient Details  Name: Tara Murphy MRN: [id number] Date of Birth: [birth year] Referring Provider (PT): Gaynelle Arabian, MD   Encounter Date: 02/28/2021   PT End of Session - 02/28/21 1101    Visit Number 7    Number of Visits 17    Date for PT Re-Evaluation 03/22/21    Authorization Type MCR    PT Start Time 1100    PT Stop Time 1136    PT Time Calculation (min) 36 min    Activity Tolerance Patient tolerated treatment well    Behavior During Therapy [facility] for tasks assessed/performed           Past Medical History:  Diagnosis Date  . Allergy   . Arthritis   . 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HCC)    S/p total hip replacement left  . Breast cancer ([place]) 2015   left   . Cataract   . Cholecystolithiasis   . Chronic allergic rhinitis   . Depression   . Former smoker, stopped smoking in distant past 10/05/2019   Quit 2000; 8 y smoking history  . GERD (gastroesophageal reflux disease)   . Hepatic steatosis 06/08/2020   Intermittent elevated LFTs and ultrasound 2018  . Hyperlipemia   . IBS (irritable bowel syndrome) 10/08/2018   Nl colonoscopy and EGD 09/2018  . IBS (irritable bowel syndrome)   . Lichen plano-pilaris   . 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 Neuropathy, peripheral, idiopathic    uses gabapentin  . Osteopenia 09/07/2019   Dexa 08/2019: T = -1.2 at wrist; back and hips excluded (DJD and hardware).   . Personal history of radiation therapy 2015  . 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 Repeat 2020  . Urinary tract infection    hx of frequent     Past Surgical History:  Procedure Laterality Date  . APPENDECTOMY    . BREAST LUMPECTOMY Left 2015  . cataracts    . CHOLECYSTECTOMY N/A 10/29/2016   Procedure: LAPAROSCOPIC  CHOLECYSTECTOMY;  Surgeon: Erroll Luna, MD;  Location: [facility];  Service: General;  Laterality: N/A;  . COLONOSCOPY    . LIPOSUCTION    . TOTAL HIP ARTHROPLASTY     bilateral hip arthoplasty  . TOTAL HIP ARTHROPLASTY Right 12/18/2015   Procedure: RIGHT TOTAL HIP ARTHROPLASTY ANTERIOR APPROACH;  Surgeon: Gaynelle Arabian, MD;  [facility];  Service: Orthopedics;  Laterality: Right;  . TOTAL KNEE ARTHROPLASTY    . TOTAL KNEE ARTHROPLASTY Left 11/25/2016   Procedure: LEFT TOTAL KNEE ARTHROPLASTY;  Surgeon: Gaynelle Arabian, MD;  [facility];  Service: Orthopedics;  Laterality: Left;  with abductor block  . TUBAL LIGATION    . UPPER GASTROINTESTINAL ENDOSCOPY      There were no vitals filed for this visit.   Subjective Assessment - 02/28/21 1102    Subjective Was sore after last time but felt so muh better the next day. Walked the [place], site. Still sore when waking and start walking.    Patient Stated Goals decr pain, be able to sit and get back up.    Currently in Pain? Yes    Pain Score 3     Pain Location Hip    Pain Orientation Left    Pain Descriptors / Indicators Sore    Aggravating Factors  AM    Pain Relieving Factors moving around              [facility] PT Assessment - 02/28/21 0001      AROM   Overall AROM Comments Lt knee ext -3                         [facility] Adult PT Treatment/Exercise - 02/28/21 0001      Knee/Hip Exercises: Stretches   Hip Flexor Stretch Limitations mod thomas    Other Knee/Hip Stretches AM stretches: heel slide bent knee fallout, piriformis stretch, bridges    Other Knee/Hip Stretches standing lateral lunge for pectineus stretching      Knee/Hip Exercises: Aerobic   Nustep 5 min L7 UE & LE      Knee/Hip Exercises: Standing   Other Standing Knee Exercises standing glut sets      Knee/Hip Exercises: Supine   Bridges with Clamshell Both;10 reps   blue, following manual therapy, limited d/t cramping   Other Supine  Knee/Hip Exercises glut sets heels on bolster      Manual Therapy   Soft tissue mobilization illiopsoas and pectineus on Left sid                    PT Short Term Goals - 02/23/21 1154      PT SHORT TERM GOAL #1   Title Lt knee ext to 0    Baseline will measure at next viist.      PT SHORT TERM GOAL #2   Title demo heel-toe gait pattern    Status Achieved             PT Long Term Goals - 01/22/21 2042      PT LONG TERM GOAL #1   Title bed mobility without incr hip pain    Baseline severe at eval    Time 8    Period Weeks    Status New    Target Date 03/22/21      PT LONG TERM GOAL #2   Title able to stand and begin walking minimal to no discomfort in hip    Baseline severe at eval    Time 8    Period Weeks    Status New    Target Date 03/22/21      PT LONG TERM GOAL #3   Title hip abd strength 5/5    Baseline see flowsheet    Time 8    Period Weeks    Status New    Target Date 03/22/21                 Plan - 02/28/21 1112    Clinical Impression Statement Pain with active hip abd but not passive. Found TTP at pectineus and was able to perform active hip abd following STM with soreness rather than pain. Difficulty with bridges due to HS cramping indicating need for further post chain strengthening. still lacking full knee extension which is also contributing.    PT Treatment/Interventions ADLs/Self Care Home Management;Cryotherapy;Moist Heat;Electrical Stimulation;Stair training;Gait training;Functional mobility training;Neuromuscular re-education;Therapeutic exercise;Therapeutic activities;Patient/family education;Manual techniques;Passive range of motion;Dry needling;Taping;Aquatic Therapy    PT Next Visit Plan trial knee joint mobs    PT Home Exercise Plan [id number], [id number] (AM stretches)    Consulted and Agree with Plan of Care Patient           Patient will benefit from skilled therapeutic intervention in order to improve the  following  deficits and impairments:  Abnormal gait,Decreased mobility,Difficulty walking,Increased muscle spasms,Improper body mechanics,Decreased range of motion,Decreased activity tolerance,Decreased strength,Impaired flexibility,Postural dysfunction,Pain  Visit Diagnosis: Pain in left hip  Difficulty in walking, not elsewhere classified  Muscle weakness (generalized)     Problem List Patient Active Problem List   Diagnosis Date Noted  . Hepatic steatosis 06/08/2020  . Former smoker, stopped smoking in distant past 10/05/2019  . Osteopenia 09/07/2019  . IBS (irritable bowel syndrome) 10/08/2018  . 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 Hammer toe 04/13/2018  . Osteoarthritis of right foot 04/13/2018  . GERD (gastroesophageal reflux disease) 11/20/2017  . Familial hyperlipidemia 11/20/2017  . Lichen plano-pilaris 11/20/2017  . Restless leg syndrome 11/20/2017  . Essential tremor 09/26/2016  . OA (osteoarthritis) of knee 12/18/2015  . OA (osteoarthritis) of hip 12/18/2015  . Osteoarthritis of spine with radiculopathy, 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  . History of Clostridium difficile colitis 06/16/2015  . 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 Chronic allergic rhinitis 06/13/2015  . Neuropathy, peripheral, idiopathic 06/13/2015  . Recurrent urinary tract infection 06/13/2015  . History of left breast cancer 03/20/2015    Helyne Genther C. Albertina Leise PT, DPT 02/28/21 11:51 AM   [place], [place], [address] Phone: [phone]   Fax:  [phone]  Name: Tara Murphy MRN: [id number] Date of Birth: 08/03/1946

## 2021-03-02 ENCOUNTER — Other Ambulatory Visit: Payer: Self-pay

## 2021-03-02 ENCOUNTER — Encounter (HOSPITAL_BASED_OUTPATIENT_CLINIC_OR_DEPARTMENT_OTHER): Payer: Self-pay | Admitting: Physical Therapy

## 2021-03-02 ENCOUNTER — Ambulatory Visit (HOSPITAL_BASED_OUTPATIENT_CLINIC_OR_DEPARTMENT_OTHER): Payer: Medicare Other | Admitting: Physical Therapy

## 2021-03-02 DIAGNOSIS — M25552 Pain in left hip: Secondary | ICD-10-CM | POA: Diagnosis not present

## 2021-03-02 DIAGNOSIS — M6281 Muscle weakness (generalized): Secondary | ICD-10-CM | POA: Diagnosis not present

## 2021-03-02 DIAGNOSIS — R262 Difficulty in walking, not elsewhere classified: Secondary | ICD-10-CM | POA: Diagnosis not present

## 2021-03-02 NOTE — Therapy (Signed)
[facility] [address] [place], [place], [address] Phone: [phone]   Fax:  [phone]  Physical Therapy Treatment  Patient Details  Name: Tara Murphy MRN: [id number] Date of Birth: May 16, 1946 Referring Provider (PT): Gaynelle Arabian, MD   Encounter Date: 03/02/2021   PT End of Session - 03/02/21 1108     Visit Number 8    Number of Visits 17    Date for PT Re-Evaluation 03/22/21    Authorization Type MCR    Progress Note Due on Visit 10    PT Start Time 4193    PT Stop Time 7902    PT Time Calculation (min) 40 min    Activity Tolerance Patient tolerated treatment well    Behavior During Therapy [facility] for tasks assessed/performed             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[place])    S/p total hip replacement left   Breast cancer ([place]) 2015   left    Cataract    Cholecystolithiasis    Chronic allergic rhinitis    Depression    Former smoker, stopped smoking in distant past 10/05/2019   Quit 2000; 27 y smoking history   GERD (gastroesophageal reflux disease)    Hepatic steatosis 06/08/2020   Intermittent elevated LFTs and ultrasound 2018   Hyperlipemia    IBS (irritable bowel syndrome) 10/08/2018   Nl colonoscopy and EGD 09/2018   IBS (irritable bowel syndrome)    Lichen plano-pilaris    Major depression, 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 Repeat 2020   Urinary tract infection    hx of frequent     Past Surgical History:  Procedure Laterality Date   APPENDECTOMY     BREAST LUMPECTOMY Left 2015   cataracts     CHOLECYSTECTOMY N/A 10/29/2016   Procedure:  LAPAROSCOPIC CHOLECYSTECTOMY;  Surgeon: Erroll Luna, MD;  Location: [facility];  Service: General;  Laterality: N/A;   COLONOSCOPY     LIPOSUCTION     TOTAL HIP ARTHROPLASTY     bilateral hip arthoplasty   TOTAL HIP ARTHROPLASTY Right 12/18/2015   Procedure: RIGHT TOTAL HIP ARTHROPLASTY ANTERIOR APPROACH;  Surgeon: Gaynelle Arabian, MD;  Location: [facility];  Service: Orthopedics;  Laterality: Right;   TOTAL KNEE ARTHROPLASTY     TOTAL KNEE ARTHROPLASTY Left 11/25/2016   Procedure: LEFT TOTAL KNEE ARTHROPLASTY;  Surgeon: Gaynelle Arabian, MD;  Location: [facility];  Service: Orthopedics;  Laterality: Left;  with abductor block   TUBAL LIGATION     UPPER GASTROINTESTINAL ENDOSCOPY      There were no vitals filed for this visit.   Subjective Assessment - 03/02/21 1108     Subjective The pectineus is very sore in the first 3 steps or so but then it's ok.    Patient Stated Goals decr pain, be able to sit and get back up.    Currently in Pain? No/denies  [facility] Adult PT Treatment/Exercise - 03/02/21 0001       Knee/Hip Exercises: Aerobic   Nustep 5 min L7 UE & LE      Knee/Hip Exercises: Supine   Other Supine Knee/Hip Exercises ab set with add prog to ab set with TT    Other Supine Knee/Hip Exercises heel slide- end range active flx, full ext +quad set      Manual Therapy   Joint Mobilization prone tibia on femur PA at end range ext    Soft tissue mobilization iastm lT HS & gastroc    Muscle Energy Technique Lt hip flexors/Rt extensors                      PT Short Term Goals - 02/23/21 1154       PT SHORT TERM GOAL #1   Title Lt knee ext to 0    Baseline will measure at next viist.      PT SHORT TERM GOAL #2   Title demo heel-toe gait pattern    Status Achieved               PT Long Term Goals - 01/22/21 2042       PT LONG TERM GOAL #1   Title bed mobility without incr hip pain    Baseline severe at eval    Time 8     Period Weeks    Status New    Target Date 03/22/21      PT LONG TERM GOAL #2   Title able to stand and begin walking minimal to no discomfort in hip    Baseline severe at eval    Time 8    Period Weeks    Status New    Target Date 03/22/21      PT LONG TERM GOAL #3   Title hip abd strength 5/5    Baseline see flowsheet    Time 8    Period Weeks    Status New    Target Date 03/22/21                   Plan - 03/02/21 1238     Clinical Impression Statement Worked on soft tissue and joint mobility to improve Lt knee ext but cont to demo flexion in gait. Reported resolution of pain following MET and was instructed on doing this at home. Will benefit from further lumbopelvic stabilization exercises for core strength.    PT Treatment/Interventions ADLs/Self Care Home Management;Cryotherapy;Moist Heat;Electrical Stimulation;Stair training;Gait training;Functional mobility training;Neuromuscular re-education;Therapeutic exercise;Therapeutic activities;Patient/family education;Manual techniques;Passive range of motion;Dry needling;Taping;Aquatic Therapy    PT Next Visit Plan progress core strength    PT Home Exercise Plan [id number], [id number] (AM stretches)    Consulted and Agree with Plan of Care Patient             Patient will benefit from skilled therapeutic intervention in order to improve the following deficits and impairments:  Abnormal gait, Decreased mobility, Difficulty walking, Increased muscle spasms, Improper body mechanics, Decreased range of motion, Decreased activity tolerance, Decreased strength, Impaired flexibility, Postural dysfunction, Pain  Visit Diagnosis: Pain in left hip  Muscle weakness (generalized)     Problem List Patient Active Problem List   Diagnosis Date Noted   Hepatic steatosis 06/08/2020   Former smoker, stopped smoking in distant past 10/05/2019   Osteopenia 09/07/2019   IBS (irritable bowel syndrome) 10/08/2018   Tubulovillous  adenoma of colon 10/08/2018   Hammer toe  04/13/2018   Osteoarthritis of right foot 04/13/2018   GERD (gastroesophageal reflux disease) 11/20/2017   Familial hyperlipidemia 22/30/0979   Lichen plano-pilaris 11/20/2017   Restless leg syndrome 11/20/2017   Essential tremor 09/26/2016   OA (osteoarthritis) of knee 12/18/2015   OA (osteoarthritis) of hip 12/18/2015   Osteoarthritis of spine with radiculopathy, 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   History of Clostridium difficile colitis 06/16/2015   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 Xoey Warmoth PT, DPT 03/02/21 12:42 PM   [facility] [place], [place], [address] Phone: [phone]   Fax:  [phone]  Name: Tara Murphy MRN: [id number] Date of Birth: [birth year]

## 2021-03-07 ENCOUNTER — Encounter (HOSPITAL_BASED_OUTPATIENT_CLINIC_OR_DEPARTMENT_OTHER): Payer: Self-pay | Admitting: Physical Therapy

## 2021-03-07 ENCOUNTER — Ambulatory Visit (HOSPITAL_BASED_OUTPATIENT_CLINIC_OR_DEPARTMENT_OTHER): Payer: Medicare Other | Admitting: Physical Therapy

## 2021-03-07 ENCOUNTER — Other Ambulatory Visit: Payer: Self-pay

## 2021-03-07 DIAGNOSIS — R262 Difficulty in walking, not elsewhere classified: Secondary | ICD-10-CM | POA: Diagnosis not present

## 2021-03-07 DIAGNOSIS — M6281 Muscle weakness (generalized): Secondary | ICD-10-CM

## 2021-03-07 DIAGNOSIS — M25552 Pain in left hip: Secondary | ICD-10-CM | POA: Diagnosis not present

## 2021-03-07 NOTE — Therapy (Signed)
[facility] [address] [place], [place], [address] Phone: [phone]   Fax:  [phone]  Physical Therapy Treatment  Patient Details  Name: Consepcion Murphy MRN: [id number] Date of Birth: Feb 12, 1946 Referring Provider (PT): Gaynelle Arabian, MD   Encounter Date: 03/07/2021   PT End of Session - 03/07/21 1106     Visit Number 9    Number of Visits 17    Date for PT Re-Evaluation 03/22/21    Authorization Type MCR    Progress Note Due on Visit 10    PT Start Time 1104    PT Stop Time 1142    PT Time Calculation (min) 38 min    Activity Tolerance Patient tolerated treatment well    Behavior During Therapy [facility] for tasks assessed/performed             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[place])    S/p total hip replacement left   Breast cancer ([place]) 2015   left    Cataract    Cholecystolithiasis    Chronic allergic rhinitis    Depression    Former smoker, stopped smoking in distant past 10/05/2019   Quit 2000; 6 y smoking history   GERD (gastroesophageal reflux disease)    Hepatic steatosis 06/08/2020   Intermittent elevated LFTs and ultrasound 2018   Hyperlipemia    IBS (irritable bowel syndrome) 10/08/2018   Nl colonoscopy and EGD 09/2018   IBS (irritable bowel syndrome)    Lichen plano-pilaris    Major depression, 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 Repeat 2020   Urinary tract infection    hx of frequent     Past Surgical History:  Procedure Laterality Date   APPENDECTOMY     BREAST LUMPECTOMY Left 2015   cataracts     CHOLECYSTECTOMY N/A 10/29/2016   Procedure:  LAPAROSCOPIC CHOLECYSTECTOMY;  Surgeon: Erroll Luna, MD;  Location: [facility];  Service: General;  Laterality: N/A;   COLONOSCOPY     LIPOSUCTION     TOTAL HIP ARTHROPLASTY     bilateral hip arthoplasty   TOTAL HIP ARTHROPLASTY Right 12/18/2015   Procedure: RIGHT TOTAL HIP ARTHROPLASTY ANTERIOR APPROACH;  Surgeon: Gaynelle Arabian, MD;  Location: [facility];  Service: Orthopedics;  Laterality: Right;   TOTAL KNEE ARTHROPLASTY     TOTAL KNEE ARTHROPLASTY Left 11/25/2016   Procedure: LEFT TOTAL KNEE ARTHROPLASTY;  Surgeon: Gaynelle Arabian, MD;  Location: [facility];  Service: Orthopedics;  Laterality: Left;  with abductor block   TUBAL LIGATION     UPPER GASTROINTESTINAL ENDOSCOPY      There were no vitals filed for this visit.   Subjective Assessment - 03/07/21 1106     Subjective I had some cramping one night.    Patient Stated Goals decr pain, be able to sit and get back up.    Currently in Pain? No/denies                               [facility] Adult PT Treatment/Exercise - 03/07/21 0001  Knee/Hip Exercises: Seated   Other Seated Knee/Hip Exercises edge of bed, ball bw knees- round back with alt punches, with OH reaches    Sit to Sand 15 reps;without UE support   ball bw knees- cues for gluts     Knee/Hip Exercises: Supine   Other Supine Knee/Hip Exercises ab set, ab set+add, shoulder flx & scissors, oblique sidebends, hundreds    Other Supine Knee/Hip Exercises Hesch self correction for Lt post illium 2 min                      PT Short Term Goals - 02/23/21 1154       PT SHORT TERM GOAL #1   Title Lt knee ext to 0    Baseline will measure at next viist.      PT SHORT TERM GOAL #2   Title demo heel-toe gait pattern    Status Achieved               PT Long Term Goals - 01/22/21 2042       PT LONG TERM GOAL #1   Title bed mobility without incr hip pain    Baseline severe at eval    Time 8    Period Weeks    Status New    Target Date  03/22/21      PT LONG TERM GOAL #2   Title able to stand and begin walking minimal to no discomfort in hip    Baseline severe at eval    Time 8    Period Weeks    Status New    Target Date 03/22/21      PT LONG TERM GOAL #3   Title hip abd strength 5/5    Baseline see flowsheet    Time 8    Period Weeks    Status New    Target Date 03/22/21                   Plan - 03/07/21 2057     Clinical Impression Statement Corrected innominate rotation using Hesch self correction technique for low load- long duration movement of pelvis. progressed core exerises in HEP for stability.    PT Treatment/Interventions ADLs/Self Care Home Management;Cryotherapy;Moist Heat;Electrical Stimulation;Stair training;Gait training;Functional mobility training;Neuromuscular re-education;Therapeutic exercise;Therapeutic activities;Patient/family education;Manual techniques;Passive range of motion;Dry needling;Taping;Aquatic Therapy    PT Next Visit Plan 10th visit PN    PT Home Exercise Plan [id number], [id number] (AM stretches)    Consulted and Agree with Plan of Care Patient             Patient will benefit from skilled therapeutic intervention in order to improve the following deficits and impairments:  Abnormal gait, Decreased mobility, Difficulty walking, Increased muscle spasms, Improper body mechanics, Decreased range of motion, Decreased activity tolerance, Decreased strength, Impaired flexibility, Postural dysfunction, Pain  Visit Diagnosis: Pain in left hip  Muscle weakness (generalized)  Difficulty in walking, not elsewhere classified     Problem List Patient Active Problem List   Diagnosis Date Noted   Hepatic steatosis 06/08/2020   Former smoker, stopped smoking in distant past 10/05/2019   Osteopenia 09/07/2019   IBS (irritable bowel syndrome) 10/08/2018   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   Hammer toe 04/13/2018   Osteoarthritis of right foot 04/13/2018   GERD  (gastroesophageal reflux disease) 11/20/2017   Familial hyperlipidemia 16/06/9603   Lichen plano-pilaris 11/20/2017   Restless leg syndrome 11/20/2017   Essential tremor 09/26/2016   OA (  osteoarthritis) of knee 12/18/2015   OA (osteoarthritis) of hip 12/18/2015   Osteoarthritis of spine with radiculopathy, 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   History of Clostridium difficile colitis 06/16/2015   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 Momoko Slezak PT, DPT 03/07/21 9:05 PM   [facility] [place], [place], [address] Phone: [phone]   Fax:  [phone]  Name: Tara Murphy MRN: [id number] Date of Birth: 20-Aug-1946

## 2021-03-09 ENCOUNTER — Ambulatory Visit (HOSPITAL_BASED_OUTPATIENT_CLINIC_OR_DEPARTMENT_OTHER): Payer: Medicare Other | Admitting: Physical Therapy

## 2021-03-14 ENCOUNTER — Other Ambulatory Visit: Payer: Self-pay

## 2021-03-14 ENCOUNTER — Ambulatory Visit (HOSPITAL_BASED_OUTPATIENT_CLINIC_OR_DEPARTMENT_OTHER): Payer: Medicare Other | Admitting: Physical Therapy

## 2021-03-14 ENCOUNTER — Encounter (HOSPITAL_BASED_OUTPATIENT_CLINIC_OR_DEPARTMENT_OTHER): Payer: Self-pay | Admitting: Physical Therapy

## 2021-03-14 DIAGNOSIS — R262 Difficulty in walking, not elsewhere classified: Secondary | ICD-10-CM | POA: Diagnosis not present

## 2021-03-14 DIAGNOSIS — M6281 Muscle weakness (generalized): Secondary | ICD-10-CM | POA: Diagnosis not present

## 2021-03-14 DIAGNOSIS — M25552 Pain in left hip: Secondary | ICD-10-CM | POA: Diagnosis not present

## 2021-03-14 NOTE — Therapy (Signed)
[facility] [place], [place], [address] Phone: [phone]   Fax:  [phone]  Physical Therapy Treatment Progress Note Reporting Period 01/22/21 to 03/14/2021  See note below for Objective Data and Assessment of Progress/Goals.      Patient Details  Name: Tara Murphy MRN: [id number] Date of Birth: 1946/06/03 Referring Provider (PT): Gaynelle Arabian, MD   Encounter Date: 03/14/2021   PT End of Session - 03/14/21 1134     Visit Number 10    Number of Visits 17    Date for PT Re-Evaluation 03/22/21    Authorization Type MCR    Progress Note Due on Visit 20    PT Start Time 1104    PT Stop Time 1143    PT Time Calculation (min) 39 min    Activity Tolerance Patient tolerated treatment well    Behavior During Therapy [facility] for tasks assessed/performed             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[place])    S/p total hip replacement left   Breast cancer ([place]) 2015   left    Cataract    Cholecystolithiasis    Chronic allergic rhinitis    Depression    Former smoker, stopped smoking in distant past 10/05/2019   Quit 2000; 19 y smoking history   GERD (gastroesophageal reflux disease)    Hepatic steatosis 06/08/2020   Intermittent elevated LFTs and ultrasound 2018   Hyperlipemia    IBS (irritable bowel syndrome) 10/08/2018   Nl colonoscopy and EGD 09/2018   IBS (irritable bowel syndrome)    Lichen plano-pilaris    Major depression, 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 Repeat 2020   Urinary tract infection    hx of frequent     Past Surgical History:   Procedure Laterality Date   APPENDECTOMY     BREAST LUMPECTOMY Left 2015   cataracts     CHOLECYSTECTOMY N/A 10/29/2016   Procedure: LAPAROSCOPIC CHOLECYSTECTOMY;  Surgeon: Erroll Luna, MD;  Location: [facility];  Service: General;  Laterality: N/A;   COLONOSCOPY     LIPOSUCTION     TOTAL HIP ARTHROPLASTY     bilateral hip arthoplasty   TOTAL HIP ARTHROPLASTY Right 12/18/2015   Procedure: RIGHT TOTAL HIP ARTHROPLASTY ANTERIOR APPROACH;  Surgeon: Gaynelle Arabian, MD;  [facility];  Service: Orthopedics;  Laterality: Right;   TOTAL KNEE ARTHROPLASTY     TOTAL KNEE ARTHROPLASTY Left 11/25/2016   Procedure: LEFT TOTAL KNEE ARTHROPLASTY;  Surgeon: Gaynelle Arabian, MD;  [facility]: [facility];  Service: Orthopedics;  Laterality: Left;  with abductor block   TUBAL LIGATION     UPPER GASTROINTESTINAL ENDOSCOPY      There were no vitals filed for this visit.   Subjective Assessment - 03/14/21 1105     Subjective My balance is off today. My ears were ringing today, have stopped though. I tripped over my own feet the other day and fell onto the ottomon and rolled off to the floor. I noticed that my right foot was red and the burning at night is  bad. Moring stretches have been helpful.    Patient Stated Goals decr pain, be able to sit and get back up.    Currently in Pain? Yes    Pain Score 2     Pain Location Hip    Pain Orientation Left    Pain Descriptors / Indicators Nagging    Aggravating Factors  weight on LLE    Pain Relieving Factors move around decr it                [facility] PT Assessment - 03/14/21 0001       Assessment   Medical Diagnosis Lt hip pain    Referring Provider (PT) Gaynelle Arabian, MD      AROM   Overall AROM Comments Lt knee ext unchanged      Strength   Overall Strength Comments Lt CKC +Trendelenburg      Palpation   Palpation comment LLD as result of lacking Lt knee extension                           [facility] Adult PT Treatment/Exercise -  03/14/21 0001       Knee/Hip Exercises: Stretches   Active Hamstring Stretch Limitations seated EOB    Piriformis Stretch Limitations seated figure 4      Knee/Hip Exercises: Aerobic   Nustep 5 min L6 UE & Le end of session   concurrent with goals discussion     Knee/Hip Exercises: Standing   Gait Training Lt heel-toe, trunk rotation/arm swing    Other Standing Knee Exercises hip hike Lt CKC    Other Standing Knee Exercises wide tandem with Lt hip abd activation                    PT Education - 03/14/21 1246     Education Details heel lift, goals, POC    Person(s) Educated Patient    Methods Explanation    Comprehension Verbalized understanding;Need further instruction              PT Short Term Goals - 02/23/21 1154       PT SHORT TERM GOAL #1   Title Lt knee ext to 0    Baseline will measure at next viist.      PT SHORT TERM GOAL #2   Title demo heel-toe gait pattern    Status Achieved               PT Long Term Goals - 03/14/21 1136       PT LONG TERM GOAL #1   Title bed mobility without incr hip pain    Baseline bad days it is severe- probably 1.5 days/week    Status On-going      PT LONG TERM GOAL #2   Title able to stand and begin walking minimal to no discomfort in hip    Baseline 3/10    Status On-going      PT LONG TERM GOAL #3   Title hip abd strength 5/5    Baseline lacking activation of Lt hip- +trendelenburg on Lt.    Status On-going                   Plan - 03/14/21 1118     Clinical Impression Statement Single layer lift added to Lt shoe to fill gap of lacking knee ext that was resulting hip innominate rotation and hip flexor pain. Pt is progressing toward  her goals but still has room for improvement as outlined in goals section- POC ends next week and will discuss extension PRN. Exercises focused on activation of Lt hip abd group. Asked that she remove lift if incr pain is evident but to cont to work on  lengthening of Lt knee as the lift could make this worse if she does not.    PT Treatment/Interventions ADLs/Self Care Home Management;Cryotherapy;Moist Heat;Electrical Stimulation;Stair training;Gait training;Functional mobility training;Neuromuscular re-education;Therapeutic exercise;Therapeutic activities;Patient/family education;Manual techniques;Passive range of motion;Dry needling;Taping;Aquatic Therapy    PT Next Visit Plan outcome of heel lift?    PT Home Exercise Plan [id number], [id number] (AM stretches)    Consulted and Agree with Plan of Care Patient             Patient will benefit from skilled therapeutic intervention in order to improve the following deficits and impairments:  Abnormal gait, Decreased mobility, Difficulty walking, Increased muscle spasms, Improper body mechanics, Decreased range of motion, Decreased activity tolerance, Decreased strength, Impaired flexibility, Postural dysfunction, Pain  Visit Diagnosis: Pain in left hip  Muscle weakness (generalized)  Difficulty in walking, not elsewhere classified     Problem List Patient Active Problem List   Diagnosis Date Noted   Hepatic steatosis 06/08/2020   Former smoker, stopped smoking in distant past 10/05/2019   Osteopenia 09/07/2019   IBS (irritable bowel syndrome) 10/08/2018   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   Hammer toe 04/13/2018   Osteoarthritis of right foot 04/13/2018   GERD (gastroesophageal reflux disease) 11/20/2017   Familial hyperlipidemia 95/05/3266   Lichen plano-pilaris 11/20/2017   Restless leg syndrome 11/20/2017   Essential tremor 09/26/2016   OA (osteoarthritis) of knee 12/18/2015   OA (osteoarthritis) of hip 12/18/2015   Osteoarthritis of spine with radiculopathy, 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   History of Clostridium difficile colitis 06/16/2015   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 Pearley Baranek PT, DPT 03/14/21 12:47 PM   [facility] [place], [place], [address] Phone: [phone]   Fax:  [phone]  Name: Tara Murphy MRN: [id number] Date of Birth: 09/27/1945

## 2021-03-16 ENCOUNTER — Encounter (HOSPITAL_BASED_OUTPATIENT_CLINIC_OR_DEPARTMENT_OTHER): Payer: Self-pay | Admitting: Physical Therapy

## 2021-03-16 ENCOUNTER — Ambulatory Visit (HOSPITAL_BASED_OUTPATIENT_CLINIC_OR_DEPARTMENT_OTHER): Payer: Medicare Other | Admitting: Physical Therapy

## 2021-03-16 ENCOUNTER — Other Ambulatory Visit: Payer: Self-pay

## 2021-03-16 DIAGNOSIS — R262 Difficulty in walking, not elsewhere classified: Secondary | ICD-10-CM | POA: Diagnosis not present

## 2021-03-16 DIAGNOSIS — M25552 Pain in left hip: Secondary | ICD-10-CM | POA: Diagnosis not present

## 2021-03-16 DIAGNOSIS — M6281 Muscle weakness (generalized): Secondary | ICD-10-CM | POA: Diagnosis not present

## 2021-03-16 NOTE — Therapy (Signed)
[facility] [address] [place], [place], [address] Phone: [phone]   Fax:  [phone]  Physical Therapy Treatment  Patient Details  Name: Tara Murphy MRN: [id number] Date of Birth: Mar 11, 1946 Referring Provider (PT): Gaynelle Arabian, MD   Encounter Date: 03/16/2021   PT End of Session - 03/16/21 1113     Visit Number 11    Number of Visits 17    Date for PT Re-Evaluation 03/22/21    Authorization Type MCR    PT Start Time 1103    PT Stop Time 1139    PT Time Calculation (min) 36 min    Activity Tolerance Patient tolerated treatment well             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[place])    S/p total hip replacement left   Breast cancer ([place]) 2015   left    Cataract    Cholecystolithiasis    Chronic allergic rhinitis    Depression    Former smoker, stopped smoking in distant past 10/05/2019   Quit 2000; 27 y smoking history   GERD (gastroesophageal reflux disease)    Hepatic steatosis 06/08/2020   Intermittent elevated LFTs and ultrasound 2018   Hyperlipemia    IBS (irritable bowel syndrome) 10/08/2018   Nl colonoscopy and EGD 09/2018   IBS (irritable bowel syndrome)    Lichen plano-pilaris    Major depression, 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 Repeat 2020   Urinary tract infection    hx of frequent     Past Surgical History:  Procedure Laterality Date   APPENDECTOMY     BREAST LUMPECTOMY Left 2015   cataracts     CHOLECYSTECTOMY N/A 10/29/2016   Procedure: LAPAROSCOPIC CHOLECYSTECTOMY;  Surgeon: Erroll Luna, MD;  Location: [facility];  Service:  General;  Laterality: N/A;   COLONOSCOPY     LIPOSUCTION     TOTAL HIP ARTHROPLASTY     bilateral hip arthoplasty   TOTAL HIP ARTHROPLASTY Right 12/18/2015   Procedure: RIGHT TOTAL HIP ARTHROPLASTY ANTERIOR APPROACH;  Surgeon: Gaynelle Arabian, MD;  Location: [facility];  Service: Orthopedics;  Laterality: Right;   TOTAL KNEE ARTHROPLASTY     TOTAL KNEE ARTHROPLASTY Left 11/25/2016   Procedure: LEFT TOTAL KNEE ARTHROPLASTY;  Surgeon: Gaynelle Arabian, MD;  Location: [facility];  Service: Orthopedics;  Laterality: Left;  with abductor block   TUBAL LIGATION     UPPER GASTROINTESTINAL ENDOSCOPY      There were no vitals filed for this visit.   Subjective Assessment - 03/16/21 1107     Subjective Balance feels better today. One other incidence of ringing in ears, but okay since then. I feel like th heel lift helped but i would prefer to work on getting the leg straighter. The transition bothered my foot.    Patient Stated Goals decr pain, be able to sit and get back up.    Currently in Pain? No/denies  [facility] PT Treatment/Exercise - 03/16/21 0001       Manual Therapy   Manual therapy comments skilled palpation and monitoring during TPDN    Soft tissue mobilization Lt hip, piriformis, HS, knee ext in prone    Muscle Energy Technique Lt hip flexors/Rt extensors   2 rounds today             Trigger Point Dry Needling - 03/16/21 0001     Other Dry Needling LT glut med, min, TFL                  PT Education - 03/16/21 1259     Education Details sleep posture, SI belt, outcome of heel lift    Person(s) Educated Patient    Methods Explanation    Comprehension Verbalized understanding;Need further instruction              PT Short Term Goals - 02/23/21 1154       PT SHORT TERM GOAL #1   Title Lt knee ext to 0    Baseline will measure at next viist.      PT SHORT TERM GOAL #2   Title demo heel-toe gait pattern     Status Achieved               PT Long Term Goals - 03/14/21 1136       PT LONG TERM GOAL #1   Title bed mobility without incr hip pain    Baseline bad days it is severe- probably 1.5 days/week    Status On-going      PT LONG TERM GOAL #2   Title able to stand and begin walking minimal to no discomfort in hip    Baseline 3/10    Status On-going      PT LONG TERM GOAL #3   Title hip abd strength 5/5    Baseline lacking activation of Lt hip- +trendelenburg on Lt.    Status On-going                   Plan - 03/16/21 1243     Clinical Impression Statement Required MET again today but reported significant improvement following MET and manual treatment. Encouraged her to sleep with a pillow between her knees for support over night as well as possibly wear an SI belt for support. We will continue to work toward improving knee ext ROM rather than wearing heel lift to reduce innominate rotation.    PT Treatment/Interventions ADLs/Self Care Home Management;Cryotherapy;Moist Heat;Electrical Stimulation;Stair training;Gait training;Functional mobility training;Neuromuscular re-education;Therapeutic exercise;Therapeutic activities;Patient/family education;Manual techniques;Passive range of motion;Dry needling;Taping;Aquatic Therapy    PT Next Visit Plan cont lumbopelvic stability & knee ext, POC extension    PT Home Exercise Plan [id number], [id number] (AM stretches)    Consulted and Agree with Plan of Care Patient             Patient will benefit from skilled therapeutic intervention in order to improve the following deficits and impairments:  Abnormal gait, Decreased mobility, Difficulty walking, Increased muscle spasms, Improper body mechanics, Decreased range of motion, Decreased activity tolerance, Decreased strength, Impaired flexibility, Postural dysfunction, Pain  Visit Diagnosis: Pain in left hip  Muscle weakness (generalized)  Difficulty in walking, not elsewhere  classified     Problem List Patient Active Problem List   Diagnosis Date Noted   Hepatic steatosis 06/08/2020   Former smoker, stopped smoking in distant past 10/05/2019   Osteopenia 09/07/2019   IBS (irritable bowel syndrome)  10/08/2018   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   Hammer toe 04/13/2018   Osteoarthritis of right foot 04/13/2018   GERD (gastroesophageal reflux disease) 11/20/2017   Familial hyperlipidemia 17/83/7542   Lichen plano-pilaris 11/20/2017   Restless leg syndrome 11/20/2017   Essential tremor 09/26/2016   OA (osteoarthritis) of knee 12/18/2015   OA (osteoarthritis) of hip 12/18/2015   Osteoarthritis of spine with radiculopathy, 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   History of Clostridium difficile colitis 06/16/2015   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 Griffyn Kucinski PT, DPT 03/16/21 1:00 PM   [facility] [place], [place], [address] Phone: [phone]   Fax:  [phone]  Name: Amika Tassin MRN: [id number] Date of Birth: Feb 01, 1946

## 2021-03-21 ENCOUNTER — Encounter (HOSPITAL_BASED_OUTPATIENT_CLINIC_OR_DEPARTMENT_OTHER): Payer: Self-pay | Admitting: Physical Therapy

## 2021-03-21 ENCOUNTER — Other Ambulatory Visit: Payer: Self-pay

## 2021-03-21 ENCOUNTER — Ambulatory Visit (HOSPITAL_BASED_OUTPATIENT_CLINIC_OR_DEPARTMENT_OTHER): Payer: Medicare Other | Admitting: Physical Therapy

## 2021-03-21 DIAGNOSIS — R262 Difficulty in walking, not elsewhere classified: Secondary | ICD-10-CM

## 2021-03-21 DIAGNOSIS — M6281 Muscle weakness (generalized): Secondary | ICD-10-CM

## 2021-03-21 DIAGNOSIS — M25552 Pain in left hip: Secondary | ICD-10-CM | POA: Diagnosis not present

## 2021-03-21 NOTE — Therapy (Signed)
[facility] [address] [place], [place], [address] Phone: [phone]   Fax:  [phone]  Physical Therapy Treatment  Patient Details  Name: Tara Murphy MRN: [id number] Date of Birth: [birth year] Referring Provider (PT): Gaynelle Arabian, MD   Encounter Date: 03/21/2021   PT End of Session - 03/21/21 1949     Visit Number 12    Number of Visits 17    Date for PT Re-Evaluation 03/22/21    Authorization Type MCR    Progress Note Due on Visit 20    PT Start Time 1345    PT Stop Time 3474    PT Time Calculation (min) 40 min    Activity Tolerance Patient tolerated treatment well    Behavior During Therapy [facility] for tasks assessed/performed             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[place])    S/p total hip replacement left   Breast cancer ([place]) 2015   left    Cataract    Cholecystolithiasis    Chronic allergic rhinitis    Depression    Former smoker, stopped smoking in distant past 10/05/2019   Quit 2000; 67 y smoking history   GERD (gastroesophageal reflux disease)    Hepatic steatosis 06/08/2020   Intermittent elevated LFTs and ultrasound 2018   Hyperlipemia    IBS (irritable bowel syndrome) 10/08/2018   Nl colonoscopy and EGD 09/2018   IBS (irritable bowel syndrome)    Lichen plano-pilaris    Major depression, 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 Repeat 2020   Urinary tract infection    hx of frequent     Past Surgical History:  Procedure Laterality Date   APPENDECTOMY     BREAST LUMPECTOMY Left 2015   cataracts     CHOLECYSTECTOMY N/A 10/29/2016    Procedure: LAPAROSCOPIC CHOLECYSTECTOMY;  Surgeon: Erroll Luna, MD;  Location: [facility];  Service: General;  Laterality: N/A;   COLONOSCOPY     LIPOSUCTION     TOTAL HIP ARTHROPLASTY     bilateral hip arthoplasty   TOTAL HIP ARTHROPLASTY Right 12/18/2015   Procedure: RIGHT TOTAL HIP ARTHROPLASTY ANTERIOR APPROACH;  Surgeon: Gaynelle Arabian, MD;  Location: [facility];  Service: Orthopedics;  Laterality: Right;   TOTAL KNEE ARTHROPLASTY     TOTAL KNEE ARTHROPLASTY Left 11/25/2016   Procedure: LEFT TOTAL KNEE ARTHROPLASTY;  Surgeon: Gaynelle Arabian, MD;  Location: [facility];  Service: Orthopedics;  Laterality: Left;  with abductor block   TUBAL LIGATION     UPPER GASTROINTESTINAL ENDOSCOPY      There were no vitals filed for this visit.   Subjective Assessment - 03/21/21 1347     Subjective I wore the SIJ belt and it was fine. I started tripping and wobbling. feels like foot drop.                                       PT Education - 03/21/21 1938     Education Details anatomy of  condition, POC    Person(s) Educated Patient    Methods Explanation    Comprehension Verbalized understanding              PT Short Term Goals - 02/23/21 1154       PT SHORT TERM GOAL #1   Title Lt knee ext to 0    Baseline will measure at next viist.      PT SHORT TERM GOAL #2   Title demo heel-toe gait pattern    Status Achieved               PT Long Term Goals - 03/14/21 1136       PT LONG TERM GOAL #1   Title bed mobility without incr hip pain    Baseline bad days it is severe- probably 1.5 days/week    Status On-going      PT LONG TERM GOAL #2   Title able to stand and begin walking minimal to no discomfort in hip    Baseline 3/10    Status On-going      PT LONG TERM GOAL #3   Title hip abd strength 5/5    Baseline lacking activation of Lt hip- +trendelenburg on Lt.    Status On-going                   Plan - 03/21/21 1420     Clinical  Impression Statement Pt arrived to PT with Lt SIJ pain and lacking active Lt DF in gait. Is able to demo ant tib activation and heel walking but Lt forefoot drops almost to the floor while the Rt stays up. Cont to lack full extension in Lt knee joint. Able to significantly improve pain at low back, SIJ and hip with soft tissue massage to piriformis, hamstrings and gastroc as well as creep mobilizations to promote post rotation of Lt innom and Rt sacral tilt. However; even with improved pain levels, pt did not experience a change in foot drop symptoms. Will use an assistive device for now to avoid falls. She would like to be placed on hold with PT until she can f/u with MD. I recommend imaging of lumbar spine to evaluate for nerve root involvement as well as of Lt knee for possible nerve entrapment.    PT Treatment/Interventions ADLs/Self Care Home Management;Cryotherapy;Moist Heat;Electrical Stimulation;Stair training;Gait training;Functional mobility training;Neuromuscular re-education;Therapeutic exercise;Therapeutic activities;Patient/family education;Manual techniques;Passive range of motion;Dry needling;Taping;Aquatic Therapy    PT Next Visit Plan on hold until f/u- call me if she needs until then    PT Home Exercise Plan [id number], [id number] (AM stretches)    Consulted and Agree with Plan of Care Patient             Patient will benefit from skilled therapeutic intervention in order to improve the following deficits and impairments:  Abnormal gait, Decreased mobility, Difficulty walking, Increased muscle spasms, Improper body mechanics, Decreased range of motion, Decreased activity tolerance, Decreased strength, Impaired flexibility, Postural dysfunction, Pain  Visit Diagnosis: Pain in left hip  Muscle weakness (generalized)  Difficulty in walking, not elsewhere classified     Problem List Patient Active Problem List   Diagnosis Date Noted   Hepatic steatosis 06/08/2020   Former  smoker, stopped smoking in distant past 10/05/2019   Osteopenia 09/07/2019   IBS (irritable bowel syndrome) 10/08/2018   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   Hammer toe 04/13/2018   Osteoarthritis of right foot 04/13/2018   GERD (gastroesophageal reflux disease) 11/20/2017  Familial hyperlipidemia 53/64/6803   Lichen plano-pilaris 11/20/2017   Restless leg syndrome 11/20/2017   Essential tremor 09/26/2016   OA (osteoarthritis) of knee 12/18/2015   OA (osteoarthritis) of hip 12/18/2015   Osteoarthritis of spine with radiculopathy, 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   History of Clostridium difficile colitis 06/16/2015   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 Thor Nannini PT, DPT 03/21/21 7:54 PM   [facility] [address] [place], [place], [address] Phone: [phone]   Fax:  [phone]  Name: Kameka Whan MRN: [id number] Date of Birth: 06-16-1946

## 2021-03-22 DIAGNOSIS — M5459 Other low back pain: Secondary | ICD-10-CM | POA: Diagnosis not present

## 2021-03-22 DIAGNOSIS — M545 Low back pain, unspecified: Secondary | ICD-10-CM | POA: Diagnosis not present

## 2021-03-23 ENCOUNTER — Encounter (HOSPITAL_BASED_OUTPATIENT_CLINIC_OR_DEPARTMENT_OTHER): Payer: Medicare Other | Admitting: Physical Therapy

## 2021-03-29 ENCOUNTER — Telehealth: Payer: Self-pay | Admitting: Family Medicine

## 2021-03-29 DIAGNOSIS — M545 Low back pain, unspecified: Secondary | ICD-10-CM | POA: Diagnosis not present

## 2021-03-29 NOTE — Telephone Encounter (Signed)
Spoke with patient she declined AWV stating she is having too many health issues.

## 2021-04-05 DIAGNOSIS — M5417 Radiculopathy, lumbosacral region: Secondary | ICD-10-CM | POA: Diagnosis not present

## 2021-04-12 DIAGNOSIS — M21372 Foot drop, left foot: Secondary | ICD-10-CM | POA: Diagnosis not present

## 2021-04-13 ENCOUNTER — Emergency Department (HOSPITAL_BASED_OUTPATIENT_CLINIC_OR_DEPARTMENT_OTHER)
Admission: EM | Admit: 2021-04-13 | Discharge: 2021-04-13 | Disposition: A | Discharge status: Home / Self Care | Payer: Medicare Other | Attending: Emergency Medicine | Admitting: Emergency Medicine

## 2021-04-13 ENCOUNTER — Other Ambulatory Visit: Payer: Self-pay

## 2021-04-13 ENCOUNTER — Encounter (HOSPITAL_BASED_OUTPATIENT_CLINIC_OR_DEPARTMENT_OTHER): Payer: Self-pay

## 2021-04-13 DIAGNOSIS — R21 Rash and other nonspecific skin eruption: Secondary | ICD-10-CM | POA: Insufficient documentation

## 2021-04-13 DIAGNOSIS — Z5321 Procedure and treatment not carried out due to patient leaving prior to being seen by health care provider: Secondary | ICD-10-CM | POA: Insufficient documentation

## 2021-04-13 NOTE — ED Triage Notes (Signed)
"  Woke up with rash on left side of my face and I am thinking it may be shingles" per pt

## 2021-04-16 ENCOUNTER — Encounter: Payer: Self-pay | Admitting: Family Medicine

## 2021-04-16 ENCOUNTER — Ambulatory Visit (INDEPENDENT_AMBULATORY_CARE_PROVIDER_SITE_OTHER): Payer: Medicare Other | Admitting: Family Medicine

## 2021-04-16 ENCOUNTER — Other Ambulatory Visit: Payer: Self-pay

## 2021-04-16 VITALS — BP 150/88 | HR 101 | Temp 98.0°F | Resp 20 | Wt 216.0 lb

## 2021-04-16 DIAGNOSIS — B029 Zoster without complications: Secondary | ICD-10-CM | POA: Diagnosis not present

## 2021-04-16 DIAGNOSIS — R030 Elevated blood-pressure reading, without diagnosis of hypertension: Secondary | ICD-10-CM | POA: Diagnosis not present

## 2021-04-16 DIAGNOSIS — M21372 Foot drop, left foot: Secondary | ICD-10-CM | POA: Diagnosis not present

## 2021-04-16 DIAGNOSIS — R350 Frequency of micturition: Secondary | ICD-10-CM

## 2021-04-16 LAB — POCT URINALYSIS DIPSTICK
Bilirubin, UA: NEGATIVE
Blood, UA: NEGATIVE
Glucose, UA: NEGATIVE
Ketones, UA: NEGATIVE
Leukocytes, UA: NEGATIVE
Nitrite, UA: NEGATIVE
Protein, UA: NEGATIVE
Spec Grav, UA: 1.02 (ref 1.010–1.025)
Urobilinogen, UA: 0.2 E.U./dL — AB
pH, UA: 6.5 (ref 5.0–8.0)

## 2021-04-16 MED ORDER — TRAMADOL HCL 50 MG PO TABS: 50.0000 mg | ORAL_TABLET | Freq: Four times a day (QID) | ORAL | 0 refills | Status: DC | PRN

## 2021-04-16 MED ORDER — VALACYCLOVIR HCL 1 G PO TABS: 1000.0000 mg | ORAL_TABLET | Freq: Three times a day (TID) | ORAL | 0 refills | Status: DC

## 2021-04-16 NOTE — Patient Instructions (Signed)
Please return in 3-4 months for your annual complete physical; please come fasting.   I'll let you know your urine culture results.   Go get a walker.   If you have any questions or concerns, please don't hesitate to send me a message via MyChart or call the office at [phone]. Thank you for visiting with [name] today! It's our pleasure caring for you.

## 2021-04-16 NOTE — Progress Notes (Signed)
Subjective   CC:  Chief Complaint  Patient presents with   possible uti    HPI: Tara Murphy is a 75 y.o. female who presents to the office today to address the problems listed above in the chief complaint. Patient reports urge sxs ongoing for about a month. She is struggling due to acute foot drop, unclear etiology. Has been seeing ortho for LE pain; has had hip imaging and lumbar MRI. New foot drop, NCV/EMG reviewed. Struggling with balance and ambulation. To see NS and neurology next. Worries bladder sxs are due to same etiology but wants to make sure she does not have a bladder infection. No dysuria, gross hematuria, f/c or abd pain.   She denies vaginal symptoms including vaginal discharge or pelvic pain.  Last week, broke out with blistering rash on left face; self treated with valtrex she got from a neighbor after having to leave ER after 4 hour wait. Doing better. Mild irritation. No eye involvement. Has had singrix vaccinations. Minimal discomfort Stressful day: believes elevated bp is due to that. No h/o HTN.  I reviewed work up to date from ortho:below is an excerpt from his recent note:  "We discussed the patient's presenting complaints, history, and treatment options. This whole presentation has really been unusual. It started with the hip pain and obtained a hip MRI, which did not show any fluid collection. She then developed a foot drop spontaneously last month. 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 She just had the EMG nerve conduction study done last week and does not have evidence of a compressive neuropathy at the fibular neck or in the hip. 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 The question is why is this occurring and could there be some compression that just did not show up on the MRI. I want to get her to see a neurologist to see if it is a primary neurological  process and also have her see Dr. Tonita Cong or Dr. Rolena Infante for evaluation to see if the stenosis could be contributing to this or if there may be some other compressive issue that has caused the acute foot drop. We will go ahead and arrange both of those. In the interim, I have already ordered an AFO and she is wearing that. I am also going to get her to physical therapy, which she has done before at the [facility] facility, as I want them to work on flexibility so she does not get a heel cord contracture. We will be interested to see what the consultants say and also hopefully get some motion back at least passively with the therapy. "  Dr. Hector Shade   Assessment  1. Urine frequency   2. Herpes zoster without complication   3. Left foot drop   4. Elevated blood pressure reading without diagnosis of hypertension      Plan  Urinary frequency: ? OAB vs other. No sign of infection from dipstick. Await culture to be sure Foot drop: to see neuro for further evaluation. Recommend getting a walker to help prevent falls. Monitor bp Shingles: rx for full course of valtrex prescribed.  Chronic pain. Tramadol refilled. Prn use.  Follow up: No follow-ups on file.  Orders Placed This Encounter  Procedures   Urine Culture   POCT urinalysis dipstick   Meds ordered this encounter  Medications   traMADol (ULTRAM) 50 MG tablet  Sig: Take 1 tablet (50 mg total) by mouth every 6 (six) hours as needed.    Dispense:  60 tablet    Refill:  0   valACYclovir (VALTREX) 1000 MG tablet    Sig: Take 1 tablet (1,000 mg total) by mouth 3 (three) times daily.    Dispense:  21 tablet    Refill:  0      I reviewed the patients updated PMH, FH, and SocHx.    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    Priority: High   Familial hyperlipidemia 11/20/2017    Priority: High   History of left breast cancer 03/20/2015    Priority: High   Former smoker,  stopped smoking in distant past 10/05/2019    Priority: Medium   Osteopenia 09/07/2019    Priority: Medium   IBS (irritable bowel syndrome) 10/08/2018    Priority: Medium   GERD (gastroesophageal reflux disease) 11/20/2017    Priority: Medium   Restless leg syndrome 11/20/2017    Priority: Medium   OA (osteoarthritis) of knee 12/18/2015    Priority: Medium   OA (osteoarthritis) of hip 12/18/2015    Priority: Medium   Osteoarthritis of spine with radiculopathy, 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    Priority: Medium   History of Clostridium difficile colitis 06/16/2015    Priority: Medium   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, s/p THR 06/13/2015    Priority: Medium   Neuropathy, peripheral, idiopathic 06/13/2015    Priority: Medium   Hammer toe 04/13/2018    Priority: Low   Osteoarthritis of right foot 04/13/2018    Priority: Low   Lichen plano-pilaris 11/20/2017    Priority: Low   Essential tremor 09/26/2016    Priority: Low   Chronic allergic rhinitis 06/13/2015    Priority: Low   Recurrent urinary tract infection 06/13/2015    Priority: Low   Pain of left hip joint 10/30/2020   Hepatic steatosis 06/08/2020   Current Meds  Medication Sig   busPIRone (BUSPAR) 7.5 MG tablet TAKE ONE TABLET BY MOUTH TWICE A DAY AS NEEDED   clobetasol (TEMOVATE) 0.05 % external solution APPLY TO AFFECTED AREA(S) TOPICALLY TWO TIMES A DAY AS NEEDED FOR SCALP INFLAMMATION   fluticasone (FLONASE) 50 MCG/ACT nasal spray Place 2 sprays into both nostrils daily.   gabapentin (NEURONTIN) 300 MG capsule TAKE TWO CAPSULES BY MOUTH TWICE A DAY   rOPINIRole (REQUIP) 4 MG tablet Take 1 tablet (4 mg total) by mouth at bedtime.   valACYclovir (VALTREX) 1000 MG tablet Take 1 tablet (1,000 mg total) by mouth 3 (three) times daily.   [DISCONTINUED] traMADol (ULTRAM) 50 MG tablet TAKE ONE TABLET BY MOUTH EVERY 6 HOURS AS NEEDED    Review of Systems: Cardiovascular: negative for chest  pain Respiratory: negative for SOB or persistent cough Gastrointestinal: negative for abdominal pain Constitutional: Negative for fever malaise or anorexia  Objective  Vitals: BP (!) 150/88   Pulse (!) 101   Temp 98 F (36.7 C) (Temporal)   Resp 20   Wt 216 lb (98 kg)   SpO2 97%   BMI 34.86 kg/m  General: no acute distress  Psych:  Alert and oriented, normal mood and affect Gastrointestinal: soft, flat abdomen, normal active bowel sounds, no palpable masses, no hepatosplenomegaly, no appreciated hernias, NO CVAT, mild suprapubic ttp w/o rebound or guarding Wearing brace on left ankle and ambulates with cane.   Office Visit  on 04/16/2021  Component Date Value Ref Range Status   Color, UA 04/16/2021 yellow   Final   Clarity, UA 04/16/2021 clear   Final   Glucose, UA 04/16/2021 Negative  Negative Final   Bilirubin, UA 04/16/2021 neg   Final   Ketones, UA 04/16/2021 neg   Final   Spec Grav, UA 04/16/2021 1.020  1.010 - 1.025 Final   Blood, UA 04/16/2021 neg   Final   pH, UA 04/16/2021 6.5  5.0 - 8.0 Final   Protein, UA 04/16/2021 Negative  Negative Final   Urobilinogen, UA 04/16/2021 0.2 (A) 0.2 or 1.0 E.U./dL Final   Nitrite, UA 04/16/2021 neg   Final   Leukocytes, UA 04/16/2021 Negative  Negative Final   Commons side effects, risks, benefits, and alternatives for medications and treatment plan prescribed today were discussed, and the patient expressed understanding of the given instructions. Patient is instructed to call or message via MyChart if he/she has any questions or concerns regarding our treatment plan. No barriers to understanding were identified. We discussed Red Flag symptoms and signs in detail. Patient expressed understanding regarding what to do in case of urgent or emergency type symptoms.  Medication list was reconciled, printed and provided to the patient in AVS. Patient instructions and summary information was reviewed with the patient as documented in the  AVS. This note was prepared with assistance of Dragon voice recognition software. Occasional wrong-word or sound-a-like substitutions may have occurred due to the inherent limitations of voice recognition software

## 2021-04-17 ENCOUNTER — Emergency Department (HOSPITAL_COMMUNITY)
Admission: EM | Admit: 2021-04-17 | Discharge: 2021-04-18 | Disposition: A | Discharge status: Home / Self Care | Payer: Medicare Other | Attending: Physician Assistant | Admitting: Physician Assistant

## 2021-04-17 ENCOUNTER — Emergency Department (HOSPITAL_COMMUNITY): Payer: Medicare Other

## 2021-04-17 DIAGNOSIS — M25562 Pain in left knee: Secondary | ICD-10-CM | POA: Insufficient documentation

## 2021-04-17 DIAGNOSIS — R531 Weakness: Secondary | ICD-10-CM | POA: Insufficient documentation

## 2021-04-17 DIAGNOSIS — Z5321 Procedure and treatment not carried out due to patient leaving prior to being seen by health care provider: Secondary | ICD-10-CM | POA: Insufficient documentation

## 2021-04-17 DIAGNOSIS — M25572 Pain in left ankle and joints of left foot: Secondary | ICD-10-CM | POA: Insufficient documentation

## 2021-04-17 DIAGNOSIS — G8194 Hemiplegia, unspecified affecting left nondominant side: Secondary | ICD-10-CM | POA: Diagnosis not present

## 2021-04-17 DIAGNOSIS — M79605 Pain in left leg: Secondary | ICD-10-CM | POA: Diagnosis not present

## 2021-04-17 DIAGNOSIS — W19XXXA Unspecified fall, initial encounter: Secondary | ICD-10-CM | POA: Diagnosis not present

## 2021-04-17 DIAGNOSIS — Y92000 Kitchen of unspecified non-institutional (private) residence as  the place of occurrence of the external cause: Secondary | ICD-10-CM | POA: Insufficient documentation

## 2021-04-17 DIAGNOSIS — I10 Essential (primary) hypertension: Secondary | ICD-10-CM | POA: Diagnosis not present

## 2021-04-17 DIAGNOSIS — R296 Repeated falls: Secondary | ICD-10-CM | POA: Diagnosis not present

## 2021-04-17 LAB — BASIC METABOLIC PANEL
Anion gap: 12 (ref 5–15)
BUN: 11 mg/dL (ref 8–23)
CO2: 22 mmol/L (ref 22–32)
Calcium: 9.4 mg/dL (ref 8.9–10.3)
Chloride: 102 mmol/L (ref 98–111)
Creatinine, Ser: 0.68 mg/dL (ref 0.44–1.00)
GFR, Estimated: >60 mL/min (ref >60)
Glucose, Bld: 93 mg/dL (ref 70–99)
Potassium: 3.6 mmol/L (ref 3.5–5.1)
Sodium: 136 mmol/L (ref 135–145)

## 2021-04-17 LAB — CBC WITH DIFFERENTIAL/PLATELET
Abs Immature Granulocytes: 0.04 10*3/uL (ref 0.00–0.07)
Basophils Absolute: 0.1 10*3/uL (ref 0.0–0.1)
Basophils Relative: 1 %
Eosinophils Absolute: 0.1 10*3/uL (ref 0.0–0.5)
Eosinophils Relative: 1 %
HCT: 44.9 % (ref 36.0–46.0)
Hemoglobin: 14.6 g/dL (ref 12.0–15.0)
Immature Granulocytes: 1 %
Lymphocytes Relative: 28 %
Lymphs Abs: 2.5 10*3/uL (ref 0.7–4.0)
MCH: 30.3 pg (ref 26.0–34.0)
MCHC: 32.5 g/dL (ref 30.0–36.0)
MCV: 93.2 fL (ref 80.0–100.0)
Monocytes Absolute: 0.5 10*3/uL (ref 0.1–1.0)
Monocytes Relative: 5 %
Neutro Abs: 5.6 10*3/uL (ref 1.7–7.7)
Neutrophils Relative %: 64 %
Platelets: 351 10*3/uL (ref 150–400)
RBC: 4.82 MIL/uL (ref 3.87–5.11)
RDW: 12.1 % (ref 11.5–15.5)
WBC: 8.7 10*3/uL (ref 4.0–10.5)
nRBC: 0 % (ref 0.0–0.2)

## 2021-04-17 LAB — URINE CULTURE
MICRO NUMBER:: 12159352
SPECIMEN QUALITY:: ADEQUATE

## 2021-04-17 NOTE — ED Triage Notes (Signed)
Pt here via [facility] for eval of L sided weakness x3wks, fell 3wks ago, foot drop since. Fall yesterday in bathroom, fell today in kitchen. Pt states "gradually losing sensation in L side, L leg is weak." PCP referred to neurology, appt in Sept. Denies lessened sensation on L side, good pulses/cap refill

## 2021-04-17 NOTE — ED Provider Notes (Signed)
Emergency Medicine Provider Triage Evaluation Note  [facility] , a 75 y.o. female  was evaluated in triage.  Pt complains of fall and left sided weakness.  [place] three weeks ago, yesterday in the bathroom, and again today when turning in the kitchen. She admits to gradual weakness of her left side. She told pcp and they recommended neurology eval, cannot get an appointment until a month from now. Has had left foot drop x 3 weeks without known cause and did have extensive output work up. Weakness extends to her left knee. No LOC or head injury.  Review of Systems  Positive: arthralgias Negative: Headache, neck pain,   Physical Exam  BP (!) 173/80 (BP Location: Left Arm)   Pulse 96   Temp 98 F (36.7 C) (Oral)   Resp 16   SpO2 100%  Gen:   Awake, no distress   Resp:  Normal effort  MSK:   Unable to fully extend left knee. Tender to palpation of lateral malleolus of left ankle with mild swelling. Other:  No neck tenderness. No tenderness to cervical spine, no step-off or deformity.  Medical Decision Making  Medically screening exam initiated at 7:46 PM.  Appropriate orders placed.  Durinda Cope was informed that the remainder of the evaluation will be completed by another provider, this initial triage assessment does not replace that evaluation, and the importance of remaining in the ED until their evaluation is complete.  Basic labs and xrays ordered. No weakness in upper extremities.   Portions of this note were generated with Lobby[employer]. Dictation errors may occur despite best attempts at proofreading.    Barrie Folk, PA-C 04/17/21 1946    Daleen Bo, MD 04/17/21 2240

## 2021-04-18 DIAGNOSIS — M545 Low back pain, unspecified: Secondary | ICD-10-CM | POA: Diagnosis not present

## 2021-04-18 DIAGNOSIS — M6281 Muscle weakness (generalized): Secondary | ICD-10-CM | POA: Diagnosis not present

## 2021-04-19 ENCOUNTER — Other Ambulatory Visit: Payer: Self-pay

## 2021-04-19 ENCOUNTER — Ambulatory Visit (HOSPITAL_BASED_OUTPATIENT_CLINIC_OR_DEPARTMENT_OTHER): Payer: Medicare Other | Attending: Orthopedic Surgery | Admitting: Physical Therapy

## 2021-04-19 DIAGNOSIS — M6281 Muscle weakness (generalized): Secondary | ICD-10-CM | POA: Insufficient documentation

## 2021-04-19 DIAGNOSIS — R296 Repeated falls: Secondary | ICD-10-CM | POA: Insufficient documentation

## 2021-04-19 DIAGNOSIS — R262 Difficulty in walking, not elsewhere classified: Secondary | ICD-10-CM | POA: Diagnosis not present

## 2021-04-20 ENCOUNTER — Other Ambulatory Visit: Payer: Self-pay | Admitting: Family Medicine

## 2021-04-20 ENCOUNTER — Encounter (HOSPITAL_BASED_OUTPATIENT_CLINIC_OR_DEPARTMENT_OTHER): Payer: Self-pay | Admitting: Physical Therapy

## 2021-04-20 NOTE — Therapy (Signed)
[facility] [address] [place], [place], [address] Phone: [phone]   Fax:  [phone]  Physical Therapy Evaluation  Patient Details  Name: Tara Murphy MRN: [id number] Date of Birth: 1946-03-09 Referring Provider (PT): Gaynelle Arabian, MD   Encounter Date: 04/19/2021   PT End of Session - 04/20/21 1118     Visit Number 1    Number of Visits 12    Date for PT Re-Evaluation 06/01/21    Authorization Type MCR KX next visit    PT Start Time 1315    PT Stop Time 2505    PT Time Calculation (min) 43 min    Activity Tolerance Patient tolerated treatment well    Behavior During Therapy [facility] for tasks assessed/performed             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[facility])    S/p total hip replacement left   Breast cancer ([facility]) 2015   left    Cataract    Cholecystolithiasis    Chronic allergic rhinitis    Depression    Former smoker, stopped smoking in distant past 10/05/2019   Quit 2000; 16 y smoking history   GERD (gastroesophageal reflux disease)    Hepatic steatosis 06/08/2020   Intermittent elevated LFTs and ultrasound 2018   Hyperlipemia    IBS (irritable bowel syndrome) 10/08/2018   Nl colonoscopy and EGD 09/2018   IBS (irritable bowel syndrome)    Lichen plano-pilaris    Major depression, 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 Repeat 2020   Urinary tract infection    hx of frequent     Past Surgical History:  Procedure Laterality Date   APPENDECTOMY     BREAST LUMPECTOMY Left 2015   cataracts     CHOLECYSTECTOMY N/A 10/29/2016   Procedure: LAPAROSCOPIC  CHOLECYSTECTOMY;  Surgeon: Erroll Luna, MD;  Location: [facility];  Service: General;  Laterality: N/A;   COLONOSCOPY     LIPOSUCTION     TOTAL HIP ARTHROPLASTY     bilateral hip arthoplasty   TOTAL HIP ARTHROPLASTY Right 12/18/2015   Procedure: RIGHT TOTAL HIP ARTHROPLASTY ANTERIOR APPROACH;  Surgeon: Gaynelle Arabian, MD;  Location: [facility];  Service: Orthopedics;  Laterality: Right;   TOTAL KNEE ARTHROPLASTY     TOTAL KNEE ARTHROPLASTY Left 11/25/2016   Procedure: LEFT TOTAL KNEE ARTHROPLASTY;  Surgeon: Gaynelle Arabian, MD;  Location: [facility];  Service: Orthopedics;  Laterality: Left;  with abductor block   TUBAL LIGATION     UPPER GASTROINTESTINAL ENDOSCOPY      There were no vitals filed for this visit.    Subjective Assessment - 04/20/21 0834     Subjective Patient reports that since her last visit she has had a porgressive loss of function in her left leg. She is having a hard time lifting it andshe has lost feeling in the leg. She is now also having urinary freqeuncy and incontinance problems. She saw her primary a few days ago and she was advised to see a neurologist. She is scheduled but not until 9/9 to see her neuro. She has had  3 falls in the last week. She went to the ED for follow up but left 2nd to a long wait time. She was strongly advised if she has another fall or worseining symptoms to wait it out at the ED to be seen sooner then 9/9. In a month she has gone from minor foot drop to very little function of her proximal muscles of her left leg. She now has an AFO. She is using a walker that is also a wheelchair. She also feels like her ankle is tightening into DF    Pertinent History breat cancer, bilateral total hip, IBS, peripheral neuropathy, osteopenia, bilateral total hips,    How long can you stand comfortably? decreased balance as soon as she stands    How long can you walk comfortably? limited distances with frequent falls    Currently in Pain? No/denies                 [facility] PT Assessment - 04/20/21 0001       Assessment   Medical Diagnosis Lt hip pain    Referring Provider (PT) Gaynelle Arabian, MD    Onset Date/Surgical Date --   inital hip pain febraury 2022. Onset of weakness 1 month prior   Hand Dominance Right    Prior Therapy was having therapy for hip when progressive weakness began      Precautions   Precautions Fall      Restrictions   Weight Bearing Restrictions No      Balance Screen   Has the patient fallen in the past 6 months Yes    How many times? 3    Has the patient had a decrease in activity level because of a fear of falling?  Yes    Is the patient reluctant to leave their home because of a fear of falling?  Yes      [place] Private residence    Living Arrangements Spouse/significant other    Additional Comments no stairs at home      Prior Function   Level of [place] device for independence    Vocation Retired    Leisure was very active prioer to onset of Animal nutritionist   Overall Cognitive Status Within Functional Limits for tasks assessed    Attention Focused    Focused Attention Appears intact    Memory Appears intact    Awareness Appears intact    Problem Solving Appears intact      Sensation   Light Touch Appears Intact    Additional Comments limited feeling in her left leg.      Coordination   Gross Motor Movements are Fluid and Coordinated Yes    Fine Motor Movements are Fluid and Coordinated Yes    Finger Nose Finger Test UE coordination exercises normal    Heel Shin Test LE limited by ability to use hte left leg      Posture/Postural Control   Posture Comments flexed trunk in standing; forward head      AROM   Overall AROM Comments limited active hip flexion, abduction, knee extension and flexion      Strength   Overall Strength Comments mild strength deficit on the left but minimal    Right Hand Grip (lbs) 45    Left Hand Grip (lbs)  40    Right Hip Flexion 3-/5    Right Hip ABduction 4-/5    Right Hip ADduction 4-/5  Left Hip Flexion 4+/5    Left Hip ABduction 5/5    Left Hip ADduction 5/5    Right Ankle Dorsiflexion 5/5    Right Ankle Plantar Flexion 5/5    Right Ankle Inversion 5/5    Right Ankle Eversion 5/5    Left Ankle Dorsiflexion 0/5    Left Ankle Plantar Flexion 1/5    Left Ankle Inversion 0/5    Left Ankle Eversion 0/5      Palpation   Palpation comment no TTP at this time      Ambulation/Gait   Gait Comments Drags left leg. Very limited hip flexion; limited coordination of where she puts the leg. She reports she is frequently falling over it.      High Level Balance   High Level Balance Comments tandem stance mod a bilateral; narrow base min a bilateral. Etes closed marrow base mod a                        Objective measurements completed on examination: See above findings.       [facility] Adult PT Treatment/Exercise - 04/20/21 0001       Knee/Hip Exercises: Seated   Other Seated Knee/Hip Exercises gastorc stretch shown on the right side.    Other Seated Knee/Hip Exercises attempted march but unable; attmepted LAQ but unable; was able to do clamshell with yellow band. Patient advised to try otehr exrcises even if it is not powrking now. She was educated not to compensate.                    PT Education - 04/20/21 [phone]     Education Details reviewed HEP and symptom mangement    Person(s) Educated Patient    Methods Explanation;Demonstration;Tactile cues;Verbal cues    Comprehension Verbalized understanding;Returned demonstration;Verbal cues required;Tactile cues required              PT Short Term Goals - 04/20/21 0841       PT SHORT TERM GOAL #1   Title Patient will bring right ankle to neutral DF passively    Time 3    Period Weeks    Status New    Target Date 05/11/21      PT SHORT TERM GOAL #2   Title Patient will transfer sit to stand from low  table without gaurding and significant use of UE    Time 3    Period Weeks    Status On-going    Target Date 05/11/21      PT SHORT TERM GOAL #3   Title Patient will increase gross left LE strength to 3+/5    Time 3    Period Weeks    Status New    Target Date 05/11/21               PT Long Term Goals - 04/20/21 0857       PT LONG TERM GOAL #1   Title Patient will ambualte 200' without dragging left LE in order to improve safety    Time 6    Period Weeks    Status New    Target Date 06/01/21      PT LONG TERM GOAL #2   Title Patient will increase left LE strength to 4/5 in order to perfrom ADL's    Time 6    Period Weeks    Status New    Target Date 06/01/21  PT LONG TERM GOAL #3   Title Patient will transfer sit to stand independently without a significant increase in pain    Time 6    Period Weeks    Status New    Target Date 06/01/21                    Plan - 04/19/21 1353     Clinical Impression Statement Patient presents today with progressive weakness of her left leg. She began having foot drop a little over a month ago. Since that point she has had progressive loss of feeling and function in her left leg. She has fallen 3x over the past few weeks. She has decreased feeling so she is catching her foot. She is weraing an AFO and at this point only has trace muscle firing. She notes a recent onset of urinary frequency and incontenance. Her primary care MD is aware of this and her other deficits. She has an appointment on 9/9 with a neurologist. She was strongly advised to try to move her appointment up if she is able. She is trying the best she can to get in to be seen. She hs significant strength    Personal Factors and Comorbidities Comorbidity 3+    Comorbidities see comorbidities list in history    Examination-Activity Limitations Squat;Lift;Stairs;Bed Mobility;Bend;Stand;Locomotion Level;Transfers;Sit;Sleep    Stability/Clinical Decision  Making Stable/Uncomplicated    Clinical Decision Making Low    Rehab Potential Good    PT Frequency 2x / week    PT Duration 4 weeks    PT Treatment/Interventions ADLs/Self Care Home Management;Cryotherapy;Moist Heat;Electrical Stimulation;Stair training;Gait training;Functional mobility training;Neuromuscular re-education;Therapeutic exercise;Therapeutic activities;Patient/family education;Manual techniques;Passive range of motion;Dry needling;Taping;Aquatic Therapy    PT Next Visit Plan begin awautic therapy. Will monitor incontinance issues; focus on balance and strength, we will be very careful on the pool deck.    PT Home Exercise Plan [id number], [id number] (AM stretches); given df stretch and yellow band clam shell but that is all she could complete with the left leg    Consulted and Agree with Plan of Care Patient             Patient will benefit from skilled therapeutic intervention in order to improve the following deficits and impairments:  Abnormal gait, Decreased mobility, Difficulty walking, Increased muscle spasms, Improper body mechanics, Decreased range of motion, Decreased activity tolerance, Decreased strength, Impaired flexibility, Postural dysfunction, Pain  Visit Diagnosis: Muscle weakness (generalized)  Difficulty in walking, not elsewhere classified  Repeated falls     Problem List Patient Active Problem List   Diagnosis Date Noted   Pain of left hip joint 10/30/2020   Hepatic steatosis 06/08/2020   Former smoker, stopped smoking in distant past 10/05/2019   Osteopenia 09/07/2019   IBS (irritable bowel syndrome) 10/08/2018   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   Hammer toe 04/13/2018   Osteoarthritis of right foot 04/13/2018   GERD (gastroesophageal reflux disease) 11/20/2017   Familial hyperlipidemia 81/19/1478   Lichen plano-pilaris 11/20/2017   Restless leg syndrome 11/20/2017   Essential tremor 09/26/2016   OA (osteoarthritis) of knee  12/18/2015   OA (osteoarthritis) of hip 12/18/2015   Osteoarthritis of spine with radiculopathy, 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   History of Clostridium difficile colitis 06/16/2015   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   [facility] [place], [place], [address] Phone: [phone]   Fax:  [phone]  Name: Karyme Mcconathy MRN: [id number] Date of Birth: [birth year]

## 2021-04-24 ENCOUNTER — Inpatient Hospital Stay (HOSPITAL_COMMUNITY): Payer: Medicare Other

## 2021-04-24 ENCOUNTER — Other Ambulatory Visit: Payer: Self-pay

## 2021-04-24 ENCOUNTER — Ambulatory Visit (HOSPITAL_BASED_OUTPATIENT_CLINIC_OR_DEPARTMENT_OTHER): Payer: Medicare Other | Admitting: Physical Therapy

## 2021-04-24 ENCOUNTER — Emergency Department (HOSPITAL_COMMUNITY): Payer: Medicare Other

## 2021-04-24 ENCOUNTER — Encounter (HOSPITAL_COMMUNITY): Payer: Self-pay

## 2021-04-24 ENCOUNTER — Inpatient Hospital Stay (HOSPITAL_COMMUNITY)
Admission: EM | Admit: 2021-04-24 | Discharge: 2021-05-03 | DRG: 025 | Disposition: A | Discharge status: Rehabilitation Facility | Payer: Medicare Other | Attending: Internal Medicine | Admitting: Internal Medicine

## 2021-04-24 DIAGNOSIS — R000 Tachycardia, unspecified: Secondary | ICD-10-CM | POA: Diagnosis not present

## 2021-04-24 DIAGNOSIS — C3412 Malignant neoplasm of upper lobe, left bronchus or lung: Secondary | ICD-10-CM | POA: Diagnosis present

## 2021-04-24 DIAGNOSIS — M21372 Foot drop, left foot: Secondary | ICD-10-CM | POA: Diagnosis present

## 2021-04-24 DIAGNOSIS — C7931 Secondary malignant neoplasm of brain: Secondary | ICD-10-CM | POA: Diagnosis not present

## 2021-04-24 DIAGNOSIS — Z20822 Contact with and (suspected) exposure to covid-19: Secondary | ICD-10-CM | POA: Diagnosis present

## 2021-04-24 DIAGNOSIS — Z96643 Presence of artificial hip joint, bilateral: Secondary | ICD-10-CM | POA: Diagnosis present

## 2021-04-24 DIAGNOSIS — Z87891 Personal history of nicotine dependence: Secondary | ICD-10-CM

## 2021-04-24 DIAGNOSIS — R911 Solitary pulmonary nodule: Secondary | ICD-10-CM | POA: Diagnosis not present

## 2021-04-24 DIAGNOSIS — G609 Hereditary and idiopathic neuropathy, unspecified: Secondary | ICD-10-CM | POA: Diagnosis present

## 2021-04-24 DIAGNOSIS — Z9889 Other specified postprocedural states: Secondary | ICD-10-CM | POA: Diagnosis not present

## 2021-04-24 DIAGNOSIS — G936 Cerebral edema: Secondary | ICD-10-CM | POA: Diagnosis present

## 2021-04-24 DIAGNOSIS — T380X5A Adverse effect of glucocorticoids and synthetic analogues, initial encounter: Secondary | ICD-10-CM | POA: Diagnosis present

## 2021-04-24 DIAGNOSIS — Z96652 Presence of left artificial knee joint: Secondary | ICD-10-CM | POA: Diagnosis present

## 2021-04-24 DIAGNOSIS — R030 Elevated blood-pressure reading, without diagnosis of hypertension: Secondary | ICD-10-CM | POA: Diagnosis present

## 2021-04-24 DIAGNOSIS — R918 Other nonspecific abnormal finding of lung field: Secondary | ICD-10-CM

## 2021-04-24 DIAGNOSIS — F4322 Adjustment disorder with anxiety: Secondary | ICD-10-CM | POA: Diagnosis present

## 2021-04-24 DIAGNOSIS — R0689 Other abnormalities of breathing: Secondary | ICD-10-CM | POA: Diagnosis not present

## 2021-04-24 DIAGNOSIS — Z801 Family history of malignant neoplasm of trachea, bronchus and lung: Secondary | ICD-10-CM

## 2021-04-24 DIAGNOSIS — R7401 Elevation of levels of liver transaminase levels: Secondary | ICD-10-CM | POA: Diagnosis not present

## 2021-04-24 DIAGNOSIS — E669 Obesity, unspecified: Secondary | ICD-10-CM | POA: Diagnosis present

## 2021-04-24 DIAGNOSIS — G6289 Other specified polyneuropathies: Secondary | ICD-10-CM | POA: Diagnosis not present

## 2021-04-24 DIAGNOSIS — R29810 Facial weakness: Secondary | ICD-10-CM | POA: Diagnosis present

## 2021-04-24 DIAGNOSIS — Z888 Allergy status to other drugs, medicaments and biological substances status: Secondary | ICD-10-CM

## 2021-04-24 DIAGNOSIS — K219 Gastro-esophageal reflux disease without esophagitis: Secondary | ICD-10-CM | POA: Diagnosis present

## 2021-04-24 DIAGNOSIS — Z853 Personal history of malignant neoplasm of breast: Secondary | ICD-10-CM | POA: Diagnosis not present

## 2021-04-24 DIAGNOSIS — R296 Repeated falls: Secondary | ICD-10-CM | POA: Diagnosis present

## 2021-04-24 DIAGNOSIS — R569 Unspecified convulsions: Secondary | ICD-10-CM

## 2021-04-24 DIAGNOSIS — K589 Irritable bowel syndrome without diarrhea: Secondary | ICD-10-CM | POA: Diagnosis not present

## 2021-04-24 DIAGNOSIS — G40909 Epilepsy, unspecified, not intractable, without status epilepticus: Secondary | ICD-10-CM | POA: Diagnosis present

## 2021-04-24 DIAGNOSIS — G9389 Other specified disorders of brain: Secondary | ICD-10-CM

## 2021-04-24 DIAGNOSIS — R001 Bradycardia, unspecified: Secondary | ICD-10-CM | POA: Diagnosis present

## 2021-04-24 DIAGNOSIS — Z6833 Body mass index (BMI) 33.0-33.9, adult: Secondary | ICD-10-CM

## 2021-04-24 DIAGNOSIS — Z79899 Other long term (current) drug therapy: Secondary | ICD-10-CM

## 2021-04-24 DIAGNOSIS — K760 Fatty (change of) liver, not elsewhere classified: Secondary | ICD-10-CM | POA: Diagnosis present

## 2021-04-24 DIAGNOSIS — N858 Other specified noninflammatory disorders of uterus: Secondary | ICD-10-CM | POA: Diagnosis not present

## 2021-04-24 DIAGNOSIS — E785 Hyperlipidemia, unspecified: Secondary | ICD-10-CM | POA: Diagnosis present

## 2021-04-24 DIAGNOSIS — C3490 Malignant neoplasm of unspecified part of unspecified bronchus or lung: Secondary | ICD-10-CM | POA: Diagnosis not present

## 2021-04-24 DIAGNOSIS — G4089 Other seizures: Secondary | ICD-10-CM | POA: Diagnosis not present

## 2021-04-24 DIAGNOSIS — Z803 Family history of malignant neoplasm of breast: Secondary | ICD-10-CM

## 2021-04-24 DIAGNOSIS — I700 Atherosclerosis of aorta: Secondary | ICD-10-CM | POA: Diagnosis not present

## 2021-04-24 DIAGNOSIS — D496 Neoplasm of unspecified behavior of brain: Secondary | ICD-10-CM | POA: Diagnosis not present

## 2021-04-24 DIAGNOSIS — G8194 Hemiplegia, unspecified affecting left nondominant side: Secondary | ICD-10-CM | POA: Diagnosis present

## 2021-04-24 DIAGNOSIS — R4189 Other symptoms and signs involving cognitive functions and awareness: Secondary | ICD-10-CM | POA: Diagnosis present

## 2021-04-24 DIAGNOSIS — Z9181 History of falling: Secondary | ICD-10-CM

## 2021-04-24 DIAGNOSIS — F4320 Adjustment disorder, unspecified: Secondary | ICD-10-CM | POA: Diagnosis not present

## 2021-04-24 DIAGNOSIS — I959 Hypotension, unspecified: Secondary | ICD-10-CM | POA: Diagnosis not present

## 2021-04-24 DIAGNOSIS — G939 Disorder of brain, unspecified: Secondary | ICD-10-CM | POA: Diagnosis not present

## 2021-04-24 DIAGNOSIS — C3492 Malignant neoplasm of unspecified part of left bronchus or lung: Secondary | ICD-10-CM | POA: Diagnosis not present

## 2021-04-24 DIAGNOSIS — Z483 Aftercare following surgery for neoplasm: Secondary | ICD-10-CM | POA: Diagnosis not present

## 2021-04-24 DIAGNOSIS — Z902 Acquired absence of lung [part of]: Secondary | ICD-10-CM | POA: Diagnosis not present

## 2021-04-24 DIAGNOSIS — F419 Anxiety disorder, unspecified: Secondary | ICD-10-CM | POA: Diagnosis present

## 2021-04-24 DIAGNOSIS — R29898 Other symptoms and signs involving the musculoskeletal system: Secondary | ICD-10-CM | POA: Diagnosis not present

## 2021-04-24 DIAGNOSIS — F32A Depression, unspecified: Secondary | ICD-10-CM | POA: Diagnosis present

## 2021-04-24 DIAGNOSIS — I10 Essential (primary) hypertension: Secondary | ICD-10-CM | POA: Diagnosis not present

## 2021-04-24 DIAGNOSIS — C713 Malignant neoplasm of parietal lobe: Secondary | ICD-10-CM | POA: Diagnosis not present

## 2021-04-24 DIAGNOSIS — G2581 Restless legs syndrome: Secondary | ICD-10-CM | POA: Diagnosis present

## 2021-04-24 DIAGNOSIS — Z800 Family history of malignant neoplasm of digestive organs: Secondary | ICD-10-CM

## 2021-04-24 DIAGNOSIS — R531 Weakness: Secondary | ICD-10-CM | POA: Diagnosis not present

## 2021-04-24 DIAGNOSIS — Z8261 Family history of arthritis: Secondary | ICD-10-CM

## 2021-04-24 DIAGNOSIS — R739 Hyperglycemia, unspecified: Secondary | ICD-10-CM | POA: Diagnosis present

## 2021-04-24 DIAGNOSIS — G8918 Other acute postprocedural pain: Secondary | ICD-10-CM | POA: Diagnosis not present

## 2021-04-24 DIAGNOSIS — G250 Essential tremor: Secondary | ICD-10-CM | POA: Diagnosis not present

## 2021-04-24 DIAGNOSIS — Z87440 Personal history of urinary (tract) infections: Secondary | ICD-10-CM | POA: Diagnosis not present

## 2021-04-24 DIAGNOSIS — M48061 Spinal stenosis, lumbar region without neurogenic claudication: Secondary | ICD-10-CM | POA: Diagnosis present

## 2021-04-24 DIAGNOSIS — G479 Sleep disorder, unspecified: Secondary | ICD-10-CM | POA: Diagnosis not present

## 2021-04-24 DIAGNOSIS — D72829 Elevated white blood cell count, unspecified: Secondary | ICD-10-CM | POA: Diagnosis not present

## 2021-04-24 DIAGNOSIS — Z833 Family history of diabetes mellitus: Secondary | ICD-10-CM

## 2021-04-24 DIAGNOSIS — I2510 Atherosclerotic heart disease of native coronary artery without angina pectoris: Secondary | ICD-10-CM | POA: Diagnosis not present

## 2021-04-24 DIAGNOSIS — Z91041 Radiographic dye allergy status: Secondary | ICD-10-CM

## 2021-04-24 DIAGNOSIS — K5730 Diverticulosis of large intestine without perforation or abscess without bleeding: Secondary | ICD-10-CM | POA: Diagnosis not present

## 2021-04-24 LAB — CBC WITH DIFFERENTIAL/PLATELET
Abs Immature Granulocytes: 0.03 10*3/uL (ref 0.00–0.07)
Basophils Absolute: 0 10*3/uL (ref 0.0–0.1)
Basophils Relative: 0 %
Eosinophils Absolute: 0 10*3/uL (ref 0.0–0.5)
Eosinophils Relative: 0 %
HCT: 44.7 % (ref 36.0–46.0)
Hemoglobin: 14.1 g/dL (ref 12.0–15.0)
Immature Granulocytes: 0 %
Lymphocytes Relative: 12 %
Lymphs Abs: 1.2 10*3/uL (ref 0.7–4.0)
MCH: 29.9 pg (ref 26.0–34.0)
MCHC: 31.5 g/dL (ref 30.0–36.0)
MCV: 94.9 fL (ref 80.0–100.0)
Monocytes Absolute: 0.6 10*3/uL (ref 0.1–1.0)
Monocytes Relative: 6 %
Neutro Abs: 7.9 10*3/uL — ABNORMAL HIGH (ref 1.7–7.7)
Neutrophils Relative %: 82 %
Platelets: 364 10*3/uL (ref 150–400)
RBC: 4.71 MIL/uL (ref 3.87–5.11)
RDW: 12.5 % (ref 11.5–15.5)
WBC: 9.8 10*3/uL (ref 4.0–10.5)
nRBC: 0 % (ref 0.0–0.2)

## 2021-04-24 LAB — COMPREHENSIVE METABOLIC PANEL
ALT: 43 U/L (ref 0–44)
AST: 37 U/L (ref 15–41)
Albumin: 4 g/dL (ref 3.5–5.0)
Alkaline Phosphatase: 85 U/L (ref 38–126)
Anion gap: 10 (ref 5–15)
BUN: 15 mg/dL (ref 8–23)
CO2: 22 mmol/L (ref 22–32)
Calcium: 9.4 mg/dL (ref 8.9–10.3)
Chloride: 107 mmol/L (ref 98–111)
Creatinine, Ser: 0.74 mg/dL (ref 0.44–1.00)
GFR, Estimated: >60 mL/min (ref >60)
Glucose, Bld: 103 mg/dL — ABNORMAL HIGH (ref 70–99)
Potassium: 3.9 mmol/L (ref 3.5–5.1)
Sodium: 139 mmol/L (ref 135–145)
Total Bilirubin: 0.6 mg/dL (ref 0.3–1.2)
Total Protein: 7.1 g/dL (ref 6.5–8.1)

## 2021-04-24 LAB — CBG MONITORING, ED: Glucose-Capillary: 100 mg/dL — ABNORMAL HIGH (ref 70–99)

## 2021-04-24 IMAGING — CT CT HEAD W/O CM
4 series · 17 of 47 positions shown, 19 images · non-contrast
Comparison: None.

CLINICAL DATA: Seizure.

EXAM:
CT HEAD WITHOUT CONTRAST
TECHNIQUE: Contiguous axial images were obtained from the base of the skull
through the vertex without intravenous contrast.

[Series 3: head wo · axial · 0.45mm/px · z∈[+1220,+1340]mm · 7 of 34 slices shown, 9 images]
[im 5/34  brain]
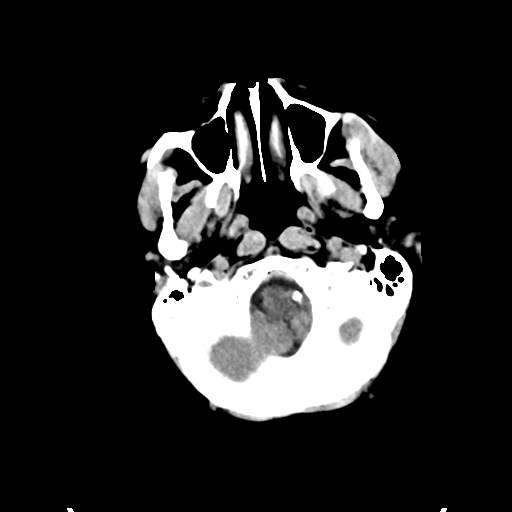
[im 5/34  bone]
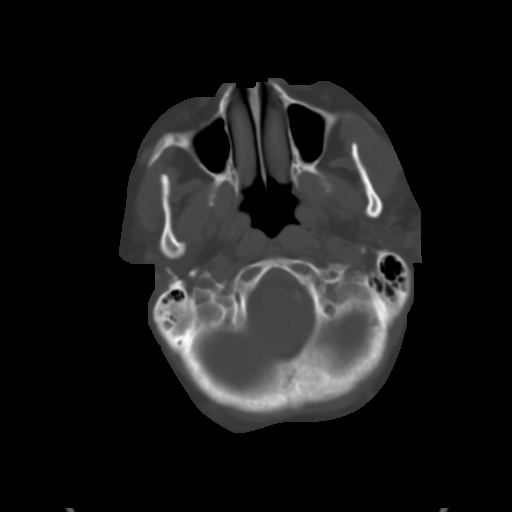
[im 9/34  brain]
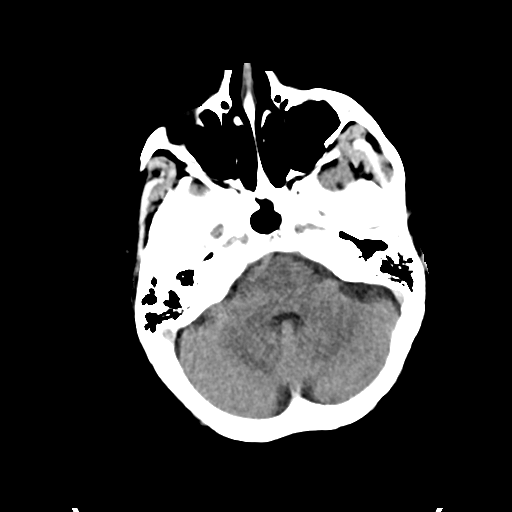
[im 13/34  brain]
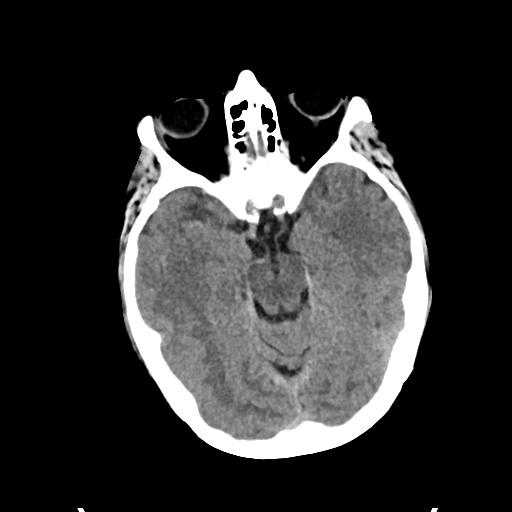
[im 17/34  brain]
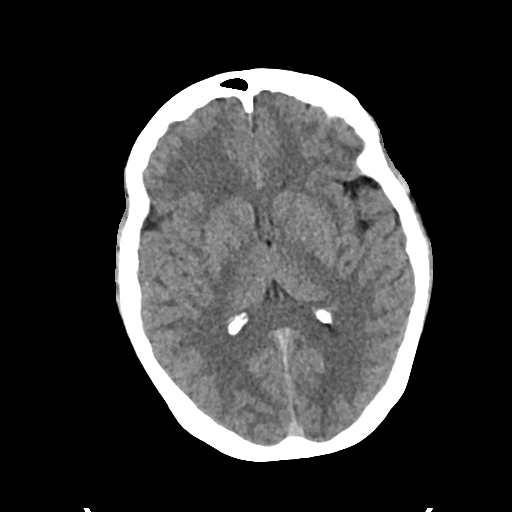
[im 21/34  brain]
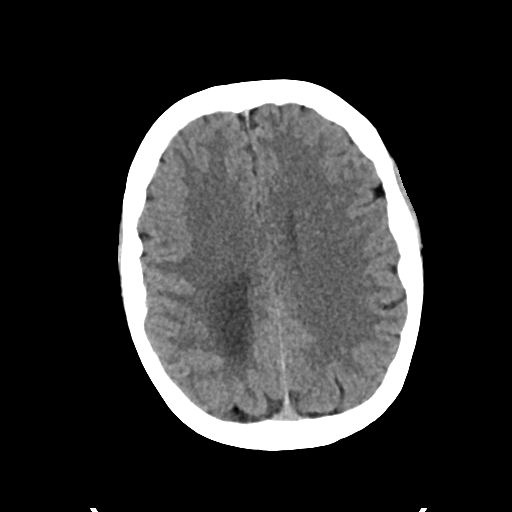
[im 21/34  bone]
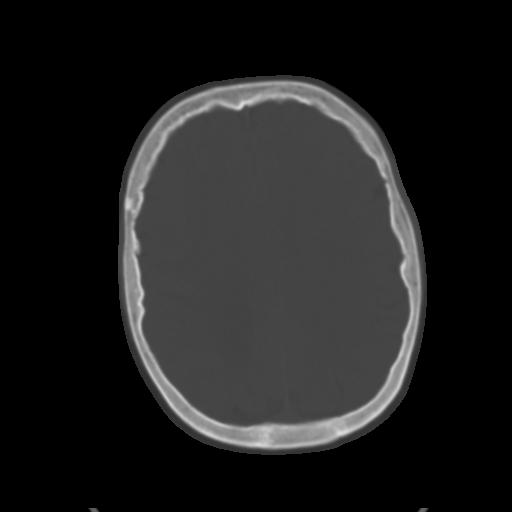
[im 25/34  brain]
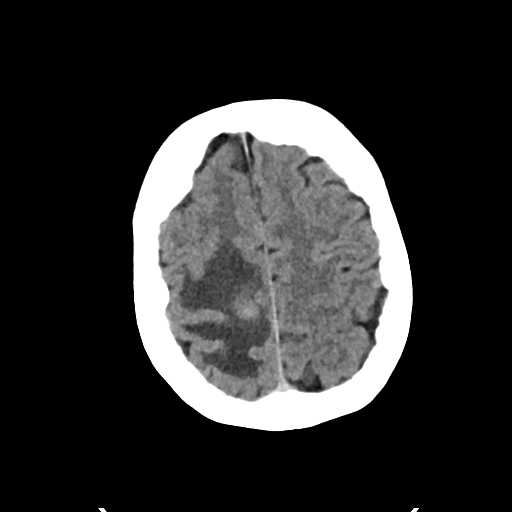
[im 29/34  brain]
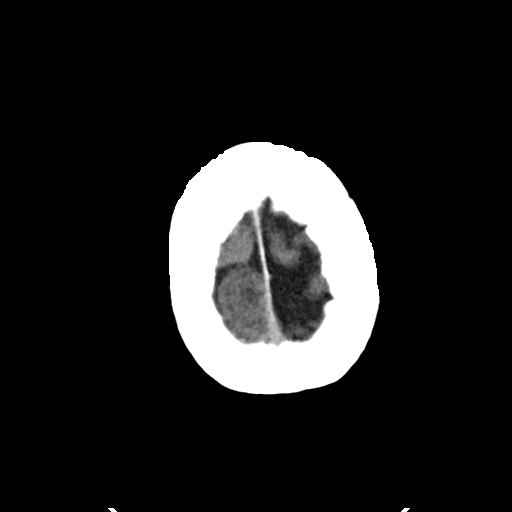

[Series 4: head bone · axial · 0.45mm/px · z∈[+1216,+1274]mm · 4 of 84 slices shown]
[im 9/84  bone]
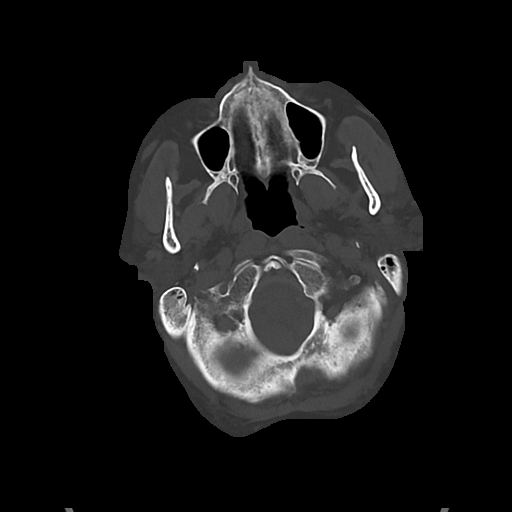
[im 17/84  bone]
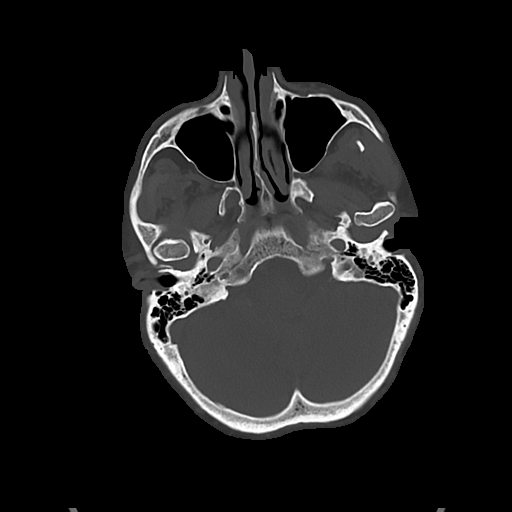
[im 25/84  bone]
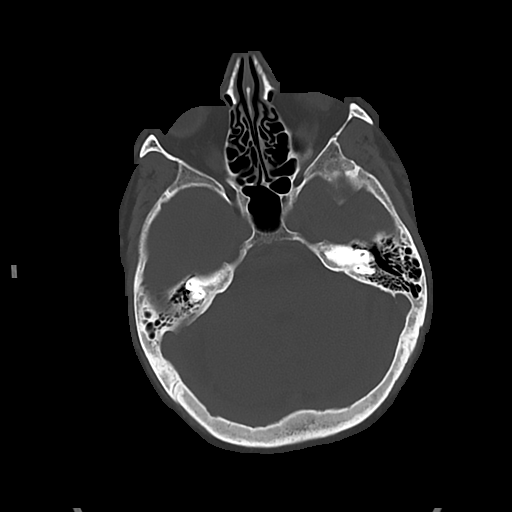
[im 38/84  bone]
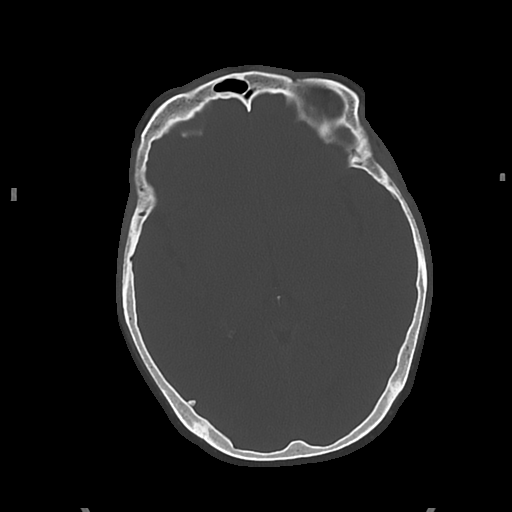

[Series 5: cor soft · coronal · 0.36mm/px · 3 of 68 slices shown]
[im 23/68  brain]
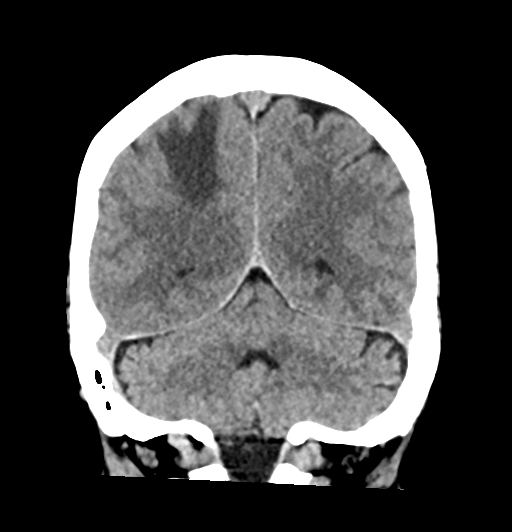
[im 30/68  brain]
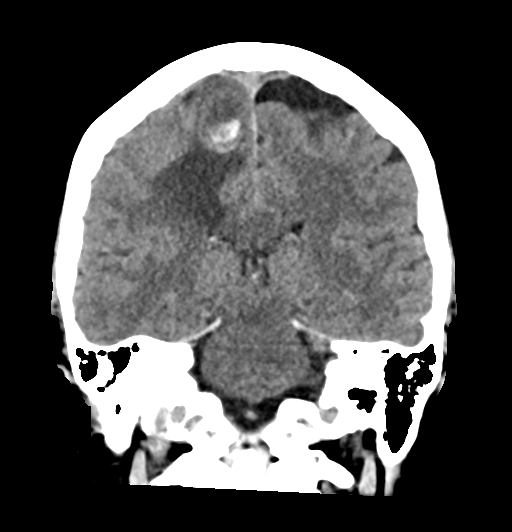
[im 38/68  brain]
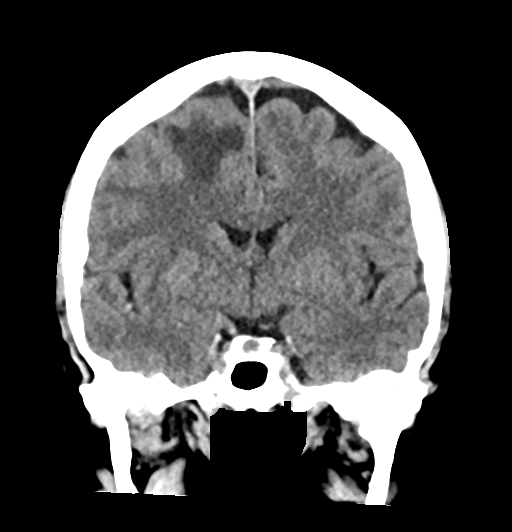

[Series 6: sag soft · sagittal · 0.37mm/px · 3 of 61 slices shown]
[im 21/61  brain]
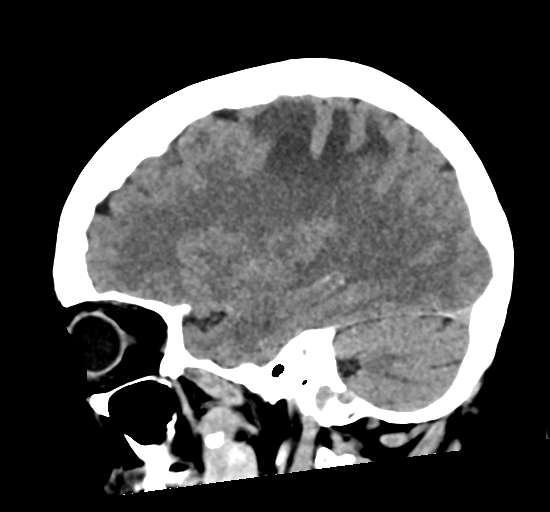
[im 31/61  brain]
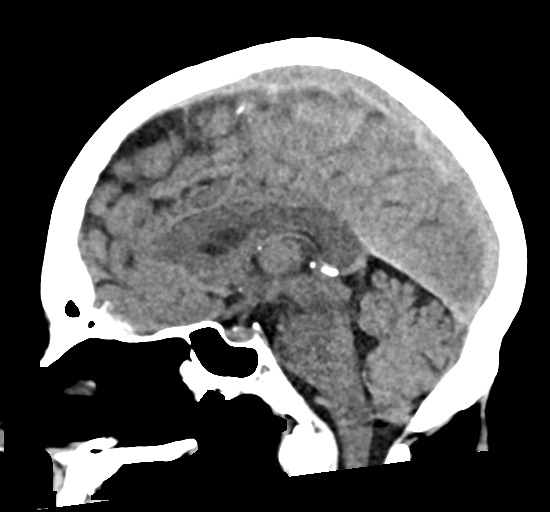
[im 41/61  brain]
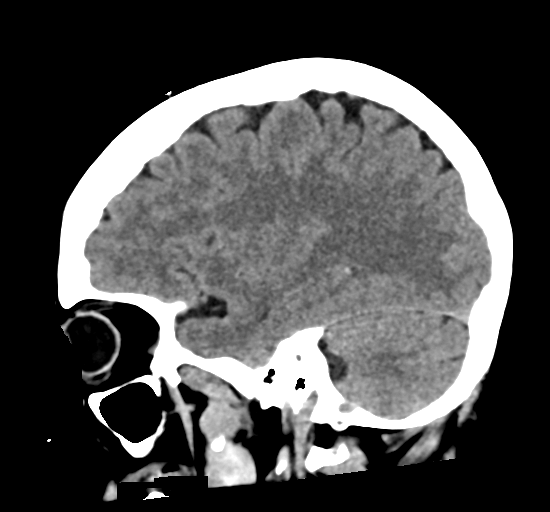

[17 of 47 positions shown; findings below may reference images not displayed]

FINDINGS: Brain: There is mild cerebral atrophy with widening of the
extra-axial spaces and ventricular dilatation.
There are areas of decreased attenuation within the white matter
tracts of the supratentorial brain, consistent with microvascular
disease changes.

A 2.2 cm x 1.9 cm x 2.2 cm well-defined low-attenuation white matter
mass (approximately 22.91 Hounsfield units) is seen within the
parasagittal posterior right frontal lobe, along the vertex (axial
CT images 25 through 29, CT series 3). This contains a small
hemorrhagic component which measures approximately 1.2 cm x 1.0 cm x
0.6 cm). A moderate to marked amount of surrounding white matter
vasogenic edema is seen. Mass effect is seen on the adjacent sulci
without evidence of midline shift.

Vascular: No hyperdense vessel or unexpected calcification.

Skull: Normal. Negative for fracture or focal lesion.

Sinuses/Orbits: No acute finding.

Other: None.
IMPRESSION: 1. Hemorrhagic posterior right frontal lobe mass and associated
vasogenic edema, as described above. MRI correlation is recommended.

## 2021-04-24 MED ORDER — DEXAMETHASONE SODIUM PHOSPHATE 4 MG/ML IJ SOLN
4.0000 mg | Freq: Four times a day (QID) | INTRAMUSCULAR | Status: DC
  Administered 2021-04-25 – 2021-04-30 (×21): 4 mg via INTRAVENOUS
  Filled 2021-04-24 (×22): qty 1

## 2021-04-24 MED ORDER — GADOBUTROL 1 MMOL/ML IV SOLN
10.0000 mL | Freq: Once | INTRAVENOUS | Status: AC | PRN
  Administered 2021-04-24: 10 mL via INTRAVENOUS

## 2021-04-24 MED ORDER — SODIUM CHLORIDE 0.9 % IV SOLN: 75.0000 mL/h | INTRAVENOUS | Status: DC

## 2021-04-24 MED ORDER — HYDROCORTISONE NA SUCCINATE PF 100 MG IJ SOLR: 100.0000 mg | Freq: Once | INTRAMUSCULAR | Status: DC

## 2021-04-24 MED ORDER — SODIUM CHLORIDE 0.9 % IV SOLN: INTRAVENOUS | Status: DC

## 2021-04-24 MED ORDER — BARIUM SULFATE 2.1 % PO SUSP
ORAL | Status: AC
  Filled 2021-04-24: qty 2

## 2021-04-24 MED ORDER — INSULIN ASPART 100 UNIT/ML IJ SOLN
0.0000 [IU] | Freq: Three times a day (TID) | INTRAMUSCULAR | Status: DC
  Administered 2021-04-25 (×2): 1 [IU] via SUBCUTANEOUS
  Administered 2021-04-26 (×2): 2 [IU] via SUBCUTANEOUS
  Administered 2021-04-26 – 2021-04-28 (×4): 1 [IU] via SUBCUTANEOUS
  Administered 2021-04-30: 2 [IU] via SUBCUTANEOUS
  Administered 2021-05-01: 1 [IU] via SUBCUTANEOUS
  Administered 2021-05-01: 2 [IU] via SUBCUTANEOUS

## 2021-04-24 MED ORDER — DIPHENHYDRAMINE HCL 25 MG PO CAPS: 50.0000 mg | ORAL_CAPSULE | Freq: Once | ORAL | Status: DC

## 2021-04-24 MED ORDER — HYDROCORTISONE NA SUCCINATE PF 250 MG IJ SOLR: 200.0000 mg | Freq: Once | INTRAMUSCULAR | Status: DC

## 2021-04-24 MED ORDER — DIPHENHYDRAMINE HCL 25 MG PO CAPS
50.0000 mg | ORAL_CAPSULE | Freq: Once | ORAL | Status: DC
  Filled 2021-04-24: qty 2

## 2021-04-24 MED ORDER — GABAPENTIN 400 MG PO CAPS
800.0000 mg | ORAL_CAPSULE | Freq: Once | ORAL | Status: DC
  Filled 2021-04-24: qty 2

## 2021-04-24 MED ORDER — DEXAMETHASONE SODIUM PHOSPHATE 4 MG/ML IJ SOLN: 4.0000 mg | INTRAMUSCULAR | Status: DC

## 2021-04-24 MED ORDER — DEXAMETHASONE SODIUM PHOSPHATE 10 MG/ML IJ SOLN
10.0000 mg | Freq: Once | INTRAMUSCULAR | Status: AC
  Administered 2021-04-24: 10 mg via INTRAVENOUS
  Filled 2021-04-24: qty 1

## 2021-04-24 MED ORDER — CHLORHEXIDINE GLUCONATE 0.12% ORAL RINSE (MEDLINE KIT)
15.0000 mL | Freq: Two times a day (BID) | OROMUCOSAL | Status: DC
  Administered 2021-04-26 – 2021-04-29 (×8): 15 mL via OROMUCOSAL

## 2021-04-24 MED ORDER — LEVETIRACETAM IN NACL 1500 MG/100ML IV SOLN
1500.0000 mg | Freq: Once | INTRAVENOUS | Status: AC
  Administered 2021-04-24: 1500 mg via INTRAVENOUS
  Filled 2021-04-24: qty 100

## 2021-04-24 MED ORDER — ORAL CARE MOUTH RINSE
15.0000 mL | OROMUCOSAL | Status: DC
  Administered 2021-04-25 – 2021-04-30 (×29): 15 mL via OROMUCOSAL

## 2021-04-24 MED ORDER — LABETALOL HCL 5 MG/ML IV SOLN
5.0000 mg | INTRAVENOUS | Status: DC | PRN
  Administered 2021-04-24: 5 mg via INTRAVENOUS
  Filled 2021-04-24: qty 4

## 2021-04-24 MED ORDER — LEVETIRACETAM IN NACL 1000 MG/100ML IV SOLN
1000.0000 mg | Freq: Two times a day (BID) | INTRAVENOUS | Status: DC
  Administered 2021-04-25: 1000 mg via INTRAVENOUS
  Filled 2021-04-24: qty 100

## 2021-04-24 MED ORDER — GABAPENTIN 800 MG PO TABS
800.0000 mg | ORAL_TABLET | Freq: Once | ORAL | Status: DC
  Filled 2021-04-24: qty 1

## 2021-04-24 MED ORDER — LEVETIRACETAM IN NACL 500 MG/100ML IV SOLN
500.0000 mg | Freq: Two times a day (BID) | INTRAVENOUS | Status: DC
  Filled 2021-04-24: qty 100

## 2021-04-24 MED ORDER — DIPHENHYDRAMINE HCL 50 MG/ML IJ SOLN
50.0000 mg | Freq: Once | INTRAMUSCULAR | Status: DC
  Filled 2021-04-24: qty 1

## 2021-04-24 MED ORDER — LORAZEPAM 2 MG/ML IJ SOLN
2.0000 mg | Freq: Four times a day (QID) | INTRAMUSCULAR | Status: DC | PRN
  Filled 2021-04-24 (×2): qty 1

## 2021-04-24 MED ORDER — LORAZEPAM 2 MG/ML IJ SOLN
1.0000 mg | Freq: Once | INTRAMUSCULAR | Status: AC
  Administered 2021-04-24: 1 mg via INTRAVENOUS
  Filled 2021-04-24: qty 1

## 2021-04-24 MED ORDER — INSULIN ASPART 100 UNIT/ML IJ SOLN
0.0000 [IU] | Freq: Every day | INTRAMUSCULAR | Status: DC
  Administered 2021-04-27 – 2021-04-29 (×2): 2 [IU] via SUBCUTANEOUS

## 2021-04-24 MED ORDER — DIPHENHYDRAMINE HCL 50 MG/ML IJ SOLN: 50.0000 mg | Freq: Once | INTRAMUSCULAR | Status: DC

## 2021-04-24 MED ORDER — PREDNISONE 20 MG PO TABS
50.0000 mg | ORAL_TABLET | Freq: Four times a day (QID) | ORAL | Status: AC
  Administered 2021-04-25 (×3): 50 mg via ORAL
  Filled 2021-04-24 (×3): qty 3

## 2021-04-24 NOTE — ED Triage Notes (Signed)
BIB EMS for seizure. Pt reported satting on the sofa and started having a seizure. Pt did not hit her head. No history seizure.

## 2021-04-24 NOTE — H&P (Addendum)
History and Physical  Tara Murphy TML:[id number] DOB: 1946/04/30 DOA: 04/24/2021  Referring physician: Dr. Maryan Rued, [place]. PCP: Leamon Arnt, MD  Outpatient Specialists: Neurology at [facility] Patient coming from: Home.  Chief Complaint: New seizure activity.  HPI: Tara Murphy is a 75 y.o. female with medical history significant for left breast cancer status postlumpectomy in 2015, left foot drop, EMG diagnosis of peripheral neuropathy, hyperlipidemia, GERD, chronic anxiety/depression, herpes zoster, former smoker, restless leg syndrome, IBS, who presented to [facility] ED from home after having a seizure like activity, likely focal with left lower and left upper extremity uncoordinated uncontrolled jerky movements, witnessed by her husband who is at bedside, accompanied by their daughter.  EMS was activated.  She was brought to the ED for further evaluation.  CT head shows a brain tumor 2 x 2 cm hemorrhagic posterior right frontal lobe mass and associated vasogenic edema.  She received loading dose of Keppra 1500 mg x 1, IV Decadron 10 mg x 1, had another seizure-like activity while in the ED, involving her left lower extremity with jerky movements.  She received 2 mg of IV Ativan.  Started on IV fluid hydration.  EDP discussed case with neurology who recommended MRI brain with and without contrast, and to obtain CT chest/abdomen/pelvis to r/o other sites of tumor.  Patient admitted under hospitalist service.  ED Course:  Temperature 97.9.  BP 152/80, pulse 86, respiration rate 23, O2 saturation 92 to 95% on room air.  Lab studies remarkable for CBC with differential essentially unremarkable.  CMP essentially unremarkable.  Review of Systems: Review of systems as noted in the HPI. All other systems reviewed and are negative.   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[place])    S/p total hip replacement left   Breast cancer ([place] [facility]) 2015   left     Cataract    Cholecystolithiasis    Chronic allergic rhinitis    Depression    Former smoker, stopped smoking in distant past 10/05/2019   Quit 2000; 44 y smoking history   GERD (gastroesophageal reflux disease)    Hepatic steatosis 06/08/2020   Intermittent elevated LFTs and ultrasound 2018   Hyperlipemia    IBS (irritable bowel syndrome) 10/08/2018   Nl colonoscopy and EGD 09/2018   IBS (irritable bowel syndrome)    Lichen plano-pilaris    Major depression, 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 Repeat 2020   Urinary tract infection    hx of frequent    Past Surgical History:  Procedure Laterality Date   APPENDECTOMY     BREAST LUMPECTOMY Left 2015   cataracts     CHOLECYSTECTOMY N/A 10/29/2016   Procedure: LAPAROSCOPIC CHOLECYSTECTOMY;  Surgeon: Erroll Luna, MD;  Location: [place] [facility];  Service: General;  Laterality: N/A;   COLONOSCOPY     LIPOSUCTION     TOTAL HIP ARTHROPLASTY     bilateral hip arthoplasty   TOTAL HIP ARTHROPLASTY Right 12/18/2015   Procedure: RIGHT TOTAL HIP ARTHROPLASTY ANTERIOR APPROACH;  Surgeon: Gaynelle Arabian, MD;  Location: [facility];  Service: Orthopedics;  Laterality: Right;   TOTAL KNEE ARTHROPLASTY  TOTAL KNEE ARTHROPLASTY Left 11/25/2016   Procedure: LEFT TOTAL KNEE ARTHROPLASTY;  Surgeon: Gaynelle Arabian, MD;  Location: [facility];  Service: Orthopedics;  Laterality: Left;  with abductor block   TUBAL LIGATION     UPPER GASTROINTESTINAL ENDOSCOPY      Social History:  reports that she quit smoking about 29 years ago. Her smoking use included cigarettes. She smoked an average of 1 pack per day. She has never used smokeless tobacco. She reports current alcohol use of about 14.0  standard drinks of alcohol per week. She reports that she does not use drugs.   Allergies  Allergen Reactions   Fluorescein Itching and Other (See Comments)   Ibuprofen Itching   Contrast Media [Iodinated Diagnostic Agents] Hives   Hydromorphone Hcl Nausea Only   Other Rash    Certain Band-Aids don't agree with the patient's skin; hospital ID wristband causes rashes   Sulfamethoxazole-Trimethoprim Hives   Fentanyl Other (See Comments)    Redness and flushing over body    Tape Rash    Certain Band-Aids don't agree with the patient's skin; hospital ID wristband causes rashes   Terbinafine And Related Hives and Rash    Family History  Problem Relation Age of Onset   Arthritis Mother    Diabetes Mother    Crohn's disease Mother    Arthritis Father    Lung cancer Father    Breast cancer Sister 56   Hyperlipidemia Maternal Aunt    Stomach cancer Maternal Grandmother    Colitis Neg Hx    Rectal cancer Neg Hx    Esophageal cancer Neg Hx       Prior to Admission medications   Medication Sig Start Date End Date Taking? Authorizing Provider  atorvastatin (LIPITOR) 10 MG tablet Take 1 tablet (10 mg total) by mouth 3 (three) times a week. 06/07/20 05/09/21  Leamon Arnt, MD  busPIRone (BUSPAR) 7.5 MG tablet TAKE ONE TABLET BY MOUTH TWICE A DAY AS NEEDED 01/08/21   Orma Flaming, MD  celecoxib (CELEBREX) 200 MG capsule Take 200 mg by mouth daily. Takes PRN    [provider]  clobetasol (TEMOVATE) 0.05 % external solution APPLY TO AFFECTED AREA(S) TOPICALLY TWO TIMES A DAY AS NEEDED FOR SCALP INFLAMMATION 03/01/20   Leamon Arnt, MD  fluticasone [facility]) 50 MCG/ACT nasal spray Place 2 sprays into both nostrils daily. 10/12/20   Inda Coke, PA  gabapentin (NEURONTIN) 300 MG capsule TAKE TWO CAPSULES BY MOUTH TWICE A DAY 09/01/20   Leamon Arnt, MD  rOPINIRole (REQUIP) 4 MG tablet Take 1 tablet (4 mg total) by mouth at bedtime. 06/07/20   Leamon Arnt, MD  traMADol  (ULTRAM) 50 MG tablet Take 1 tablet (50 mg total) by mouth every 6 (six) hours as needed. 04/16/21   Leamon Arnt, MD  valACYclovir (VALTREX) 1000 MG tablet TAKE 1 TABLET BY MOUTH THREE TIMES A DAY 04/20/21   Leamon Arnt, MD    Physical Exam: BP 134/69   Pulse 76   Temp 97.9 F (36.6 C) (Oral)   Resp 19   Ht 5\' 6"  (1.676 m)   Wt 94.3 kg   SpO2 92%   BMI 33.57 kg/m   General: 75 y.o. year-old female well developed well nourished in no acute distress.  Alert and oriented x3. Cardiovascular: Regular rate and rhythm with no rubs or gallops.  No thyromegaly or JVD noted.  No lower extremity edema. 2/4 pulses in all 4 extremities. Respiratory:  Clear to auscultation with no wheezes or rales. Good inspiratory effort. Abdomen: Soft nontender nondistended with normal bowel sounds x4 quadrants. Muskuloskeletal: No cyanosis, clubbing or edema noted bilaterally Neuro: CN II-XII intact, strength, sensation, reflexes Skin: No ulcerative lesions noted or rashes Psychiatry: Judgement and insight appear normal. Mood is appropriate for condition and setting          Labs on Admission:  Basic Metabolic Panel: Recent Labs  Lab 04/24/21 1758  NA 139  K 3.9  CL 107  CO2 22  GLUCOSE 103*  BUN 15  CREATININE 0.74  CALCIUM 9.4   Liver Function Tests: Recent Labs  Lab 04/24/21 1758  AST 37  ALT 43  ALKPHOS 85  BILITOT 0.6  PROT 7.1  ALBUMIN 4.0   No results for input(s): LIPASE, AMYLASE in the last 168 hours. No results for input(s): AMMONIA in the last 168 hours. CBC: Recent Labs  Lab 04/24/21 1758  WBC 9.8  NEUTROABS 7.9*  HGB 14.1  HCT 44.7  MCV 94.9  PLT 364   Cardiac Enzymes: No results for input(s): CKTOTAL, CKMB, CKMBINDEX, TROPONINI in the last 168 hours.  BNP (last 3 results) No results for input(s): BNP in the last 8760 hours.  ProBNP (last 3 results) No results for input(s): PROBNP in the last 8760 hours.  CBG: Recent Labs  Lab 04/24/21 1748  GLUCAP  100*    Radiological Exams on Admission: CT HEAD WO CONTRAST (5MM)  Result Date: 04/24/2021 CLINICAL DATA:  Seizure. EXAM: CT HEAD WITHOUT CONTRAST TECHNIQUE: Contiguous axial images were obtained from the base of the skull through the vertex without intravenous contrast. COMPARISON:  None. FINDINGS: Brain: There is mild cerebral atrophy with widening of the extra-axial spaces and ventricular dilatation. There are areas of decreased attenuation within the white matter tracts of the supratentorial brain, consistent with microvascular disease changes. A 2.2 cm x 1.9 cm x 2.2 cm well-defined low-attenuation white matter mass (approximately 22.91 Hounsfield units) is seen within the parasagittal posterior right frontal lobe, along the vertex (axial CT images 25 through 29, CT series 3). This contains a small hemorrhagic component which measures approximately 1.2 cm x 1.0 cm x 0.6 cm). A moderate to marked amount of surrounding white matter vasogenic edema is seen. Mass effect is seen on the adjacent sulci without evidence of midline shift. Vascular: No hyperdense vessel or unexpected calcification. Skull: Normal. Negative for fracture or focal lesion. Sinuses/Orbits: No acute finding. Other: None. IMPRESSION: 1. Hemorrhagic posterior right frontal lobe mass and associated vasogenic edema, as described above. MRI correlation is recommended. Electronically Signed   By: Virgina Norfolk M.D.   On: 04/24/2021 19:19    EKG: I independently viewed the EKG done and my findings are as followed: Sinus tach rate of 103.  Nonspecific ST-T changes.  QTc 447.  Assessment/Plan Present on Admission: **None**  Active Problems:   Seizure ([place])  New seizure activity in the setting of newly diagnosed brain tumor. Presented from home via EMS after new focal seizure-like activity involving left upper and left lower extremities, witnessed by her husband. She received loading dose of Keppra 1500 mg x 1, IV Decadron 10 mg x  1 Had another seizure-like activity while in the ED, she received 2 mg of IV Ativan.   Follow EEG Seizure precautions IV Keppra 1g BID Neurology consulted, awaiting recommendations.  Newly diagnosed brain tumor with prior history of breast cancer (2014/2015) EDP discussed case with neurology who recommended MRI brain with and without  contrast, and to obtain CT chest/abdomen/pelvis to r/o other sites of tumor.   CT head shows a brain tumor 2.2 cm x 2 cm hemorrhagic posterior right frontal lobe mass and associated vasogenic edema.  No midline shift.  Repeat CT head in the AM She received loading dose of Keppra 1500 mg x 1, IV Decadron 10 mg x 1 IV Decadron 4 mg q 6H Please consult radiation oncology in the AM Patient follows at [facility] oncology  Elevated BP Not on oral antihypertensive prior to admission BP goal normotensive, less than 130/80 due to hemorrhagic mass IV Labetalol PRN with parameters Closely monitor vital signs  Chronic anxiety/depression On Buspar prior to admission, resume after home meds are reconciled.  Ambulatory dysfunction Gait abnormality Left foot drop PT OT evaluation when stable Fall precautions  History of IV dye contrast allergy CT chest/abd/pelvis with contrast ordered for staging Premedicate x 13 hours Give prednisone 50 mg every 6 hours x3 doses, give IV Benadryl 50 mg x 1 within 1 hour of contrast injection.    DVT prophylaxis: SCDs   Code Status: Full code   Family Communication: Husband and daughter at bedside   Disposition Plan: Admit to progressive unit   Consults called: Neurology/stroke team.  Please consult radiation oncology in the AM.   Admission status: Inpatient status.  Patient will require at least 2 midnights for further evaluation and treatment of present condition.   Status is: Inpatient    Dispo: The patient is from: Home               Anticipated d/c is to: Home               Patient currently not stable for  discharge.    Difficult to place patient, not applicable.        Kayleen Memos MD [facility] Pager [phone]  If 7PM-7AM, please contact night-coverage [email or web address] Password [email or web address]  04/24/2021, 10:43 PM

## 2021-04-24 NOTE — ED Provider Notes (Signed)
[facility] Provider Note   CSN: [id number] Arrival date & time: 04/24/21  1740     History Chief Complaint  Patient presents with   Seizures   Extremity Weakness    Tara Murphy is a 75 y.o. female.  Patient is a 75 year old female with a history of hyperlipidemia, prior breast cancer, former smoker who takes medication only for restless leg syndrome who is presenting today after a first-time seizure.  4 weeks ago patient was walking her dog when she started tripping and when she got in the house noticed she had weakness of her left foot.  She went to see her doctor and was diagnosed with a foot drop.  She had MRI of her hip and lumbar spine that were negative.  She had been getting around and walking and just having to be more careful but then approximately 2 weeks ago she was backing and fell again when her entire left leg went numb.  She had x-rays again at that time that were all negative.  She was set up for a follow-up with neurology but had noticed for the last 2 days her left arm just did not seem as dexterous.  She was noticing that she had to watch what she was doing when she was using her left hand and had just felt slightly funny.  She saw neurology today and they were planning on doing more testing but approximately 2 hours after she got home her right arm and leg started jerking uncontrollably.  It was painful and felt like a spasm.  She was screaming for her husband because of the convulsions but it did finally stop after 5 minutes.  She has not had headache, visual changes, difficulty swallowing.  She denies any neck pain.  She has had no medication changes except that she was weaning down on gabapentin because she felt like it made her too drowsy.  She drinks wine occasionally but no tobacco or drug use.  The history is provided by the patient.  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[place])    S/p total hip replacement left   Breast cancer ([place]) 2015   left    Cataract    Cholecystolithiasis    Chronic allergic rhinitis    Depression    Former smoker, stopped smoking in distant past 10/05/2019   Quit 2000; 47 y smoking history   GERD (gastroesophageal reflux disease)    Hepatic steatosis 06/08/2020   Intermittent elevated LFTs and ultrasound 2018   Hyperlipemia    IBS (irritable bowel syndrome) 10/08/2018   Nl colonoscopy and EGD 09/2018   IBS (irritable bowel syndrome)    Lichen plano-pilaris    Major depression, 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   Hammer toe 04/13/2018   Osteoarthritis of right foot 04/13/2018   GERD (gastroesophageal reflux disease) 11/20/2017   Familial hyperlipidemia 53/97/6734   Lichen plano-pilaris 11/20/2017   Restless leg syndrome 11/20/2017   Essential tremor 09/26/2016   OA (osteoarthritis) of knee 12/18/2015   OA (osteoarthritis) of hip 12/18/2015   Osteoarthritis of spine with radiculopathy, 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   History of Clostridium difficile colitis 06/16/2015   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, s/p THR 06/13/2015   Chronic allergic rhinitis 06/13/2015   Neuropathy, peripheral, idiopathic 06/13/2015    Recurrent urinary tract infection 06/13/2015   History of left breast cancer 03/20/2015    Past Surgical History:  Procedure Laterality Date   APPENDECTOMY     BREAST LUMPECTOMY Left 2015   cataracts     CHOLECYSTECTOMY N/A 10/29/2016   Procedure: LAPAROSCOPIC CHOLECYSTECTOMY;  Surgeon: Erroll Luna, MD;  Location: [facility];  Service: General;  Laterality: N/A;   COLONOSCOPY     LIPOSUCTION     TOTAL HIP ARTHROPLASTY     bilateral hip arthoplasty   TOTAL HIP ARTHROPLASTY Right 12/18/2015   Procedure: RIGHT TOTAL HIP ARTHROPLASTY ANTERIOR APPROACH;  Surgeon: Gaynelle Arabian, MD;  Location: [facility];  Service: Orthopedics;  Laterality: Right;   TOTAL KNEE ARTHROPLASTY     TOTAL KNEE ARTHROPLASTY Left 11/25/2016   Procedure: LEFT TOTAL KNEE ARTHROPLASTY;  Surgeon: Gaynelle Arabian, MD;  Location: [facility];  Service: Orthopedics;  Laterality: Left;  with abductor block   TUBAL LIGATION     UPPER GASTROINTESTINAL ENDOSCOPY       OB History   No obstetric history on file.     Family History  Problem Relation Age of Onset   Arthritis Mother    Diabetes Mother    Crohn's disease Mother    Arthritis Father    Lung cancer Father    Breast cancer Sister 74   Hyperlipidemia Maternal Aunt    Stomach cancer Maternal Grandmother    Colitis Neg Hx    Rectal cancer Neg Hx    Esophageal cancer Neg Hx     Social History   Tobacco Use   Smoking status: Former    Packs/day: 1.00    Types: Cigarettes    Quit date: 11/22/1991    Years since quitting: 29.4   Smokeless tobacco: Never  Vaping Use   Vaping Use: Never used  Substance Use Topics   Alcohol use: Yes    Alcohol/week: 14.0 standard drinks    Types: 14 Glasses of wine per week   Drug use: No    Home Medications Prior to Admission medications   Medication Sig Start Date End Date Taking? Authorizing Provider  atorvastatin (LIPITOR) 10 MG tablet Take 1 tablet (10 mg total) by mouth 3 (three) times a week. 06/07/20 05/09/21  Leamon Arnt,  MD  busPIRone (BUSPAR) 7.5 MG tablet TAKE ONE TABLET BY MOUTH TWICE A DAY AS NEEDED 01/08/21   Orma Flaming, MD  celecoxib (CELEBREX) 200 MG capsule Take 200 mg by mouth daily. Takes PRN    [provider]  clobetasol (TEMOVATE) 0.05 % external solution APPLY TO AFFECTED AREA(S) TOPICALLY TWO TIMES A DAY AS NEEDED FOR SCALP INFLAMMATION 03/01/20   Leamon Arnt, MD  fluticasone [facility]) 50 MCG/ACT nasal spray Place 2 sprays into both nostrils daily. 10/12/20   Inda Coke, PA  gabapentin (NEURONTIN) 300 MG capsule  TAKE TWO CAPSULES BY MOUTH TWICE A DAY 09/01/20   Leamon Arnt, MD  rOPINIRole (REQUIP) 4 MG tablet Take 1 tablet (4 mg total) by mouth at bedtime. 06/07/20   Leamon Arnt, MD  traMADol (ULTRAM) 50 MG tablet Take 1 tablet (50 mg total) by mouth every 6 (six) hours as needed. 04/16/21   Leamon Arnt, MD  valACYclovir (VALTREX) 1000 MG tablet TAKE 1 TABLET BY MOUTH THREE TIMES A DAY 04/20/21   Leamon Arnt, MD    Allergies    Fluorescein, Ibuprofen, Contrast media [iodinated diagnostic agents], Hydromorphone hcl, Other, Sulfamethoxazole-trimethoprim, Fentanyl, Tape, and Terbinafine and related  Review of Systems   Review of Systems  Musculoskeletal:  Positive for extremity weakness.  Neurological:  Positive for seizures.  All other systems reviewed and are negative.  Physical Exam Updated Vital Signs BP 138/72 (BP Location: Right Arm)   Pulse 97   Temp 97.9 F (36.6 C) (Oral)   Resp 15   Ht 5\' 6"  (1.676 m)   Wt 94.3 kg   SpO2 96%   BMI 33.57 kg/m   Physical Exam Vitals and nursing note reviewed.  Constitutional:      General: She is not in acute distress.    Appearance: She is well-developed.  HENT:     Head: Normocephalic and atraumatic.  Eyes:     General: No visual field deficit.    Conjunctiva/sclera: Conjunctivae normal.     Pupils: Pupils are equal, round, and reactive to light.  Cardiovascular:     Rate and Rhythm: Normal rate and  regular rhythm.     Heart sounds: No murmur heard. Pulmonary:     Effort: Pulmonary effort is normal. No respiratory distress.     Breath sounds: Normal breath sounds. No wheezing or rales.  Abdominal:     General: There is no distension.     Palpations: Abdomen is soft.     Tenderness: There is no abdominal tenderness. There is no guarding or rebound.  Musculoskeletal:        General: No tenderness. Normal range of motion.     Cervical back: Normal range of motion and neck supple.     Right lower leg: No edema.     Left lower leg: No edema.  Skin:    General: Skin is warm and dry.     Findings: No erythema or rash.  Neurological:     Mental Status: She is alert and oriented to person, place, and time.     Cranial Nerves: No dysarthria.     Sensory: Sensation is intact.     Motor: Weakness present.     Coordination: Coordination is intact.     Comments: No clonus noted in bilateral lower extremities.  2 out of 5 strength in the left lower extremity.  4-5 strength in the left upper extremity with mild pronator drift in the left upper extremity.  Psychiatric:        Mood and Affect: Mood normal.        Behavior: Behavior normal.        Thought Content: Thought content normal.    ED Results / Procedures / Treatments   Labs (all labs ordered are listed, but only abnormal results are displayed) Labs Reviewed  CBC WITH DIFFERENTIAL/PLATELET - Abnormal; Notable for the following components:      Result Value   Neutro Abs 7.9 (*)    All other components within normal limits  COMPREHENSIVE METABOLIC PANEL -  Abnormal; Notable for the following components:   Glucose, Bld 103 (*)    All other components within normal limits  CBG MONITORING, ED - Abnormal; Notable for the following components:   Glucose-Capillary 100 (*)    All other components within normal limits    EKG EKG Interpretation  Date/Time:  Tuesday April 24 2021 17:52:46 EDT Ventricular Rate:  103 PR  Interval:  154 QRS Duration: 82 QT Interval:  341 QTC Calculation: 447 R Axis:   -11 Text Interpretation: Sinus tachycardia Probable anteroseptal infarct, old No significant change since last tracing Confirmed by Blanchie Dessert [phone]) on 04/24/2021 6:00:15 PM  Radiology CT HEAD WO CONTRAST (5MM)  Result Date: 04/24/2021 CLINICAL DATA:  Seizure. EXAM: CT HEAD WITHOUT CONTRAST TECHNIQUE: Contiguous axial images were obtained from the base of the skull through the vertex without intravenous contrast. COMPARISON:  None. FINDINGS: Brain: There is mild cerebral atrophy with widening of the extra-axial spaces and ventricular dilatation. There are areas of decreased attenuation within the white matter tracts of the supratentorial brain, consistent with microvascular disease changes. A 2.2 cm x 1.9 cm x 2.2 cm well-defined low-attenuation white matter mass (approximately 22.91 Hounsfield units) is seen within the parasagittal posterior right frontal lobe, along the vertex (axial CT images 25 through 29, CT series 3). This contains a small hemorrhagic component which measures approximately 1.2 cm x 1.0 cm x 0.6 cm). A moderate to marked amount of surrounding white matter vasogenic edema is seen. Mass effect is seen on the adjacent sulci without evidence of midline shift. Vascular: No hyperdense vessel or unexpected calcification. Skull: Normal. Negative for fracture or focal lesion. Sinuses/Orbits: No acute finding. Other: None. IMPRESSION: 1. Hemorrhagic posterior right frontal lobe mass and associated vasogenic edema, as described above. MRI correlation is recommended. Electronically Signed   By: Virgina Norfolk M.D.   On: 04/24/2021 19:19    Procedures Procedures   Medications Ordered in ED Medications  levETIRAcetam (KEPPRA) IVPB 1500 mg/ 100 mL premix (has no administration in time range)    ED Course  I have reviewed the triage vital signs and the nursing notes.  Pertinent labs & imaging  results that were available during my care of the patient were reviewed by me and considered in my medical decision making (see chart for details).    MDM Rules/Calculators/A&P                           Elderly female presenting today with progressive neurologic symptoms over the last 4 weeks and then had what sounded like a focal seizure today.  Patient has had MRI of her lumbar spine and hip that were negative.  She had seen a neurologist today who was planning further work-up.  Seizure lasted approximately 5 minutes but she had no loss of consciousness.  She is awake and alert at this time but notable deficits on the left.  Concern for brain tumor versus bleed versus stroke.  She has no cervical tenderness.  EKG with mild sinus tachycardia but otherwise normal.  Vital signs within normal limits.  CBC and CMP pending.  Head CT pending.  Patient given 1500 mg of Keppra.  8:38 PM Labs within normal limits.  Head CT with a 2 x 2 centimeter lesion with contained hemorrhage and surrounding vasogenic edema without significant midline shift.  Suspect these are the source of patient's symptoms.  Spoke with Dr. Trenton Gammon and they recommended that patient have CT of  the chest abdomen pelvis to look for primary as this looks like it could be metastatic.  Patient was given Decadron as well as Keppra but on repeat evaluation she started to have recurrent twitching of her foot.  She is given Ativan but also spoke with neurology who will come and see the patient.  Patient will be admitted to medicine for further management, subspecialist consult and treatment.  MDM   Amount and/or Complexity of Data Reviewed Clinical lab tests: ordered and reviewed Tests in the radiology section of CPT: ordered and reviewed Tests in the medicine section of CPT: ordered and reviewed Obtain history from someone other than the patient: yes Review and summarize past medical records: yes Discuss the patient with other providers:  yes Independent visualization of images, tracings, or specimens: yes  Risk of Complications, Morbidity, and/or Mortality Presenting problems: high Diagnostic procedures: high Management options: high  Patient Progress Patient progress: stable  CRITICAL CARE Performed by: Danile Trier Total critical care time: 30 minutes Critical care time was exclusive of separately billable procedures and treating other patients. Critical care was necessary to treat or prevent imminent or life-threatening deterioration. Critical care was time spent personally by me on the following activities: development of treatment plan with patient and/or surrogate as well as nursing, discussions with consultants, evaluation of patient's response to treatment, examination of patient, obtaining history from patient or surrogate, ordering and performing treatments and interventions, ordering and review of laboratory studies, ordering and review of radiographic studies, pulse oximetry and re-evaluation of patient's condition.   Final Clinical Impression(s) / ED Diagnoses Final diagnoses:  Seizure ([place])  Lesion of brain    Rx / DC Orders ED Discharge Orders     None        Blanchie Dessert, MD 04/24/21 2041

## 2021-04-25 ENCOUNTER — Inpatient Hospital Stay (HOSPITAL_COMMUNITY): Payer: Medicare Other

## 2021-04-25 DIAGNOSIS — G939 Disorder of brain, unspecified: Secondary | ICD-10-CM

## 2021-04-25 DIAGNOSIS — G9389 Other specified disorders of brain: Secondary | ICD-10-CM

## 2021-04-25 LAB — BASIC METABOLIC PANEL
Anion gap: 10 (ref 5–15)
BUN: 11 mg/dL (ref 8–23)
CO2: 22 mmol/L (ref 22–32)
Calcium: 9.1 mg/dL (ref 8.9–10.3)
Chloride: 107 mmol/L (ref 98–111)
Creatinine, Ser: 0.5 mg/dL (ref 0.44–1.00)
GFR, Estimated: >60 mL/min (ref >60)
Glucose, Bld: 165 mg/dL — ABNORMAL HIGH (ref 70–99)
Potassium: 3.9 mmol/L (ref 3.5–5.1)
Sodium: 139 mmol/L (ref 135–145)

## 2021-04-25 LAB — CBC
HCT: 43 % (ref 36.0–46.0)
Hemoglobin: 14 g/dL (ref 12.0–15.0)
MCH: 30.6 pg (ref 26.0–34.0)
MCHC: 32.6 g/dL (ref 30.0–36.0)
MCV: 93.9 fL (ref 80.0–100.0)
Platelets: 319 10*3/uL (ref 150–400)
RBC: 4.58 MIL/uL (ref 3.87–5.11)
RDW: 12.2 % (ref 11.5–15.5)
WBC: 6.3 10*3/uL (ref 4.0–10.5)
nRBC: 0 % (ref 0.0–0.2)

## 2021-04-25 LAB — HEMOGLOBIN A1C
Hgb A1c MFr Bld: 5.5 % (ref 4.8–5.6)
Mean Plasma Glucose: 111.15 mg/dL

## 2021-04-25 LAB — MAGNESIUM: Magnesium: 1.9 mg/dL (ref 1.7–2.4)

## 2021-04-25 LAB — GLUCOSE, CAPILLARY
Glucose-Capillary: 124 mg/dL — ABNORMAL HIGH (ref 70–99)
Glucose-Capillary: 192 mg/dL — ABNORMAL HIGH (ref 70–99)

## 2021-04-25 LAB — SARS CORONAVIRUS 2 (TAT 6-24 HRS): SARS Coronavirus 2: NEGATIVE

## 2021-04-25 LAB — PHOSPHORUS: Phosphorus: 4.2 mg/dL (ref 2.5–4.6)

## 2021-04-25 LAB — CBG MONITORING, ED
Glucose-Capillary: 146 mg/dL — ABNORMAL HIGH (ref 70–99)
Glucose-Capillary: 135 mg/dL — ABNORMAL HIGH (ref 70–99)

## 2021-04-25 MED ORDER — SODIUM CHLORIDE 0.9 % IV SOLN
750.0000 mg | Freq: Two times a day (BID) | INTRAVENOUS | Status: DC
  Filled 2021-04-25 (×3): qty 7.5

## 2021-04-25 MED ORDER — GABAPENTIN 600 MG PO TABS
600.0000 mg | ORAL_TABLET | Freq: Once | ORAL | Status: AC
  Administered 2021-04-25: 600 mg via ORAL
  Filled 2021-04-25: qty 1

## 2021-04-25 MED ORDER — GABAPENTIN 300 MG PO CAPS
600.0000 mg | ORAL_CAPSULE | Freq: Two times a day (BID) | ORAL | Status: DC
  Administered 2021-04-26 – 2021-05-03 (×14): 600 mg via ORAL
  Filled 2021-04-25 (×14): qty 2

## 2021-04-25 MED ORDER — BUSPIRONE HCL 15 MG PO TABS
7.5000 mg | ORAL_TABLET | Freq: Two times a day (BID) | ORAL | Status: DC | PRN
  Filled 2021-04-25: qty 1

## 2021-04-25 MED ORDER — ALPRAZOLAM 0.25 MG PO TABS: 0.2500 mg | ORAL_TABLET | Freq: Three times a day (TID) | ORAL | Status: DC | PRN

## 2021-04-25 MED ORDER — LORAZEPAM 2 MG/ML IJ SOLN: 1.0000 mg | INTRAMUSCULAR | Status: DC | PRN

## 2021-04-25 MED ORDER — DIPHENHYDRAMINE HCL 25 MG PO CAPS
50.0000 mg | ORAL_CAPSULE | Freq: Once | ORAL | Status: AC
  Administered 2021-04-25: 50 mg via ORAL

## 2021-04-25 MED ORDER — LORAZEPAM 2 MG/ML IJ SOLN
1.0000 mg | Freq: Four times a day (QID) | INTRAMUSCULAR | Status: DC | PRN
  Administered 2021-04-25: 1 mg via INTRAVENOUS

## 2021-04-25 MED ORDER — PANTOPRAZOLE SODIUM 40 MG IV SOLR
40.0000 mg | INTRAVENOUS | Status: DC
Start: 2021-04-25 — End: 2021-05-02
  Administered 2021-04-25 – 2021-05-02 (×7): 40 mg via INTRAVENOUS
  Filled 2021-04-25 (×7): qty 40

## 2021-04-25 MED ORDER — SODIUM CHLORIDE 0.9 % IV SOLN
750.0000 mg | Freq: Two times a day (BID) | INTRAVENOUS | Status: DC
  Administered 2021-04-25 – 2021-04-26 (×2): 750 mg via INTRAVENOUS
  Filled 2021-04-25 (×3): qty 7.5

## 2021-04-25 MED ORDER — IOHEXOL 300 MG/ML  SOLN
100.0000 mL | Freq: Once | INTRAMUSCULAR | Status: AC | PRN
  Administered 2021-04-25: 100 mL via INTRAVENOUS

## 2021-04-25 MED ORDER — ROPINIROLE HCL 1 MG PO TABS
4.0000 mg | ORAL_TABLET | Freq: Every day | ORAL | Status: DC
  Administered 2021-04-25 – 2021-05-02 (×8): 4 mg via ORAL
  Filled 2021-04-25 (×8): qty 4

## 2021-04-25 MED ORDER — PREDNISONE 50 MG PO TABS
50.0000 mg | ORAL_TABLET | Freq: Four times a day (QID) | ORAL | Status: AC
Start: 2021-04-25 — End: 2021-04-25

## 2021-04-25 MED ORDER — LEVETIRACETAM IN NACL 500 MG/100ML IV SOLN
500.0000 mg | Freq: Two times a day (BID) | INTRAVENOUS | Status: DC
  Filled 2021-04-25: qty 100

## 2021-04-25 MED ORDER — ALPRAZOLAM 0.25 MG PO TABS
0.2500 mg | ORAL_TABLET | Freq: Three times a day (TID) | ORAL | Status: DC | PRN
  Administered 2021-04-25: 0.25 mg via ORAL
  Filled 2021-04-25: qty 1

## 2021-04-25 MED ORDER — GABAPENTIN 300 MG PO CAPS: 600.0000 mg | ORAL_CAPSULE | Freq: Two times a day (BID) | ORAL | Status: DC

## 2021-04-25 NOTE — ED Notes (Signed)
Patient transported to CT 

## 2021-04-25 NOTE — ED Notes (Signed)
Called CT about transporting pt due to medication. CT informed she is next on the list.

## 2021-04-25 NOTE — ED Notes (Signed)
Barium 1 of 2 given

## 2021-04-25 NOTE — ED Notes (Addendum)
Barium #2 given

## 2021-04-25 NOTE — Progress Notes (Signed)
Brief neuro update:  Patient had a 5-minute episode of left lower extremity spasm that resolved with Ativan.  I did not witness the event personally but on discussion with the nurse and with the family, some concern that this could have been potential focal seizure.  She describes this as her leg being twisted and turned and it is painful and then it kind of progresses up into her abdomen and then into her left hand.  Given the location of her paramedian superior right frontal lobe hemorrhagic lesion with vasogenic edema, a potential focal tonic seizure may cause her symptoms.  For now I will increase her Keppra dose to 750 mg twice daily and will have Ativan 1 mg available as needed for seizure lasting more than 1 to 2 minutes every 4 hours.  In the morning, we will hook her up to continuous EEG and see if we can characterize this event better.  I discussed the plan with patient and the family at bedside and with the RN.  [place] Pager Number [phone]

## 2021-04-25 NOTE — Progress Notes (Signed)
PT Cancellation Note  Patient Details Name: Tara Murphy MRN: [id number] DOB: January 17, 1946   Cancelled Treatment:    Reason Eval/Treat Not Completed: Medical issues which prohibited therapy.  Pt is awaiting decision about neurosurgery and has a bleeding lesion, will hold until she is medically ready.   Ramond Dial 04/25/2021, 10:04 AM  Mee Hives, PT MS Acute Rehab Dept. Number: [place] [facility] [place]

## 2021-04-25 NOTE — H&P (View-Only) (Signed)
Reason for Consult:  Brain tumor Referring Physician:  emergency department  Tara Murphy is an 75 y.o. female.  HPI:  75 year old female with a 1 month history of left painless footdrop.  Patient also noting progressive weakness in her left upper and left lower extremity.  Denies any right-sided symptoms.  Yesterday the patient had the onset of simple partial seizure activity primarily involving her left leg.  This is persisted through the night.  She reports some mild headache.  She has no other complaints.  She has a remote history of breast cancer which was node negative in over 6 years ago.  She has no recent change in appetite, node change in weight, no fevers or chills.  No nausea vomiting.  No blood from a GI track.  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[place])    S/p total hip replacement left   Breast cancer ([place]) 2015   left    Cataract    Cholecystolithiasis    Chronic allergic rhinitis    Depression    Former smoker, stopped smoking in distant past 10/05/2019   Quit 2000; 75 y smoking history   GERD (gastroesophageal reflux disease)    Hepatic steatosis 06/08/2020   Intermittent elevated LFTs and ultrasound 2018   Hyperlipemia    IBS (irritable bowel syndrome) 10/08/2018   Nl colonoscopy and EGD 09/2018   IBS (irritable bowel syndrome)    Lichen plano-pilaris    Major depression, 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 Repeat 2020   Urinary tract infection    hx of frequent     Past Surgical History:  Procedure Laterality Date   APPENDECTOMY     BREAST LUMPECTOMY Left 2015   cataracts     CHOLECYSTECTOMY N/A 10/29/2016    Procedure: LAPAROSCOPIC CHOLECYSTECTOMY;  Surgeon: Erroll Luna, MD;  Location: [facility];  Service: General;  Laterality: N/A;   COLONOSCOPY     LIPOSUCTION     TOTAL HIP ARTHROPLASTY     bilateral hip arthoplasty   TOTAL HIP ARTHROPLASTY Right 12/18/2015   Procedure: RIGHT TOTAL HIP ARTHROPLASTY ANTERIOR APPROACH;  Surgeon: Gaynelle Arabian, MD;  Location: [facility];  Service: Orthopedics;  Laterality: Right;   TOTAL KNEE ARTHROPLASTY     TOTAL KNEE ARTHROPLASTY Left 11/25/2016   Procedure: LEFT TOTAL KNEE ARTHROPLASTY;  Surgeon: Gaynelle Arabian, MD;  Location: [facility];  Service: Orthopedics;  Laterality: Left;  with abductor block   TUBAL LIGATION     UPPER GASTROINTESTINAL ENDOSCOPY      Family History  Problem Relation Age of Onset   Arthritis Mother    Diabetes Mother    Crohn's disease Mother    Arthritis Father    Lung cancer Father    Breast cancer Sister 34   Hyperlipidemia Maternal Aunt    Stomach cancer Maternal Grandmother    Colitis Neg Hx    Rectal cancer Neg Hx    Esophageal cancer Neg Hx     Social History:  reports that she quit smoking about 29 years ago. Her smoking use included cigarettes. She smoked an average of 1 pack per day.  She has never used smokeless tobacco. She reports current alcohol use of about 14.0 standard drinks of alcohol per week. She reports that she does not use drugs.  Allergies:  Allergies  Allergen Reactions   Fluorescein Itching and Other (See Comments)   Ibuprofen Itching   Contrast Media [Iodinated Diagnostic Agents] Hives   Hydromorphone Hcl Nausea Only   Other Rash    Certain Band-Aids don't agree with the patient's skin; hospital ID wristband causes rashes   Sulfamethoxazole-Trimethoprim Hives   Fentanyl Other (See Comments)    Redness and flushing over body    Tape Rash    Certain Band-Aids don't agree with the patient's skin; hospital ID wristband causes rashes   Terbinafine And Related Hives and Rash    Medications: I have  reviewed the patient's current medications.  Results for orders placed or performed during the hospital encounter of 04/24/21 (from the past 48 hour(s))  CBG monitoring, ED     Status: Abnormal   Collection Time: 04/24/21  5:48 PM  Result Value Ref Range   Glucose-Capillary 100 (H) 70 - 99 mg/dL    Comment: Glucose reference range applies only to samples taken after fasting for at least 8 hours.  CBC with Differential/Platelet     Status: Abnormal   Collection Time: 04/24/21  5:58 PM  Result Value Ref Range   WBC 9.8 4.0 - 10.5 K/uL   RBC 4.71 3.87 - 5.11 MIL/uL   Hemoglobin 14.1 12.0 - 15.0 g/dL   HCT 44.7 36.0 - 46.0 %   MCV 94.9 80.0 - 100.0 fL   MCH 29.9 26.0 - 34.0 pg   MCHC 31.5 30.0 - 36.0 g/dL   RDW 12.5 11.5 - 15.5 %   Platelets 364 150 - 400 K/uL   nRBC 0.0 0.0 - 0.2 %   Neutrophils Relative % 82 %   Neutro Abs 7.9 (H) 1.7 - 7.7 K/uL   Lymphocytes Relative 12 %   Lymphs Abs 1.2 0.7 - 4.0 K/uL   Monocytes Relative 6 %   Monocytes Absolute 0.6 0.1 - 1.0 K/uL   Eosinophils Relative 0 %   Eosinophils Absolute 0.0 0.0 - 0.5 K/uL   Basophils Relative 0 %   Basophils Absolute 0.0 0.0 - 0.1 K/uL   Immature Granulocytes 0 %   Abs Immature Granulocytes 0.03 0.00 - 0.07 K/uL    Comment: Performed at [facility], [address] [place], [place] [address]  Comprehensive metabolic panel     Status: Abnormal   Collection Time: 04/24/21  5:58 PM  Result Value Ref Range   Sodium 139 135 - 145 mmol/L   Potassium 3.9 3.5 - 5.1 mmol/L   Chloride 107 98 - 111 mmol/L   CO2 22 22 - 32 mmol/L   Glucose, Bld 103 (H) 70 - 99 mg/dL    Comment: Glucose reference range applies only to samples taken after fasting for at least 8 hours.   BUN 15 8 - 23 mg/dL   Creatinine, Ser 0.74 0.44 - 1.00 mg/dL   Calcium 9.4 8.9 - 10.3 mg/dL   Total Protein 7.1 6.5 - 8.1 g/dL   Albumin 4.0 3.5 - 5.0 g/dL   AST 37 15 - 41 U/L   ALT 43 0 - 44 U/L   Alkaline Phosphatase 85 38 - 126 U/L   Total  Bilirubin 0.6 0.3 - 1.2 mg/dL   GFR, Estimated >60 >60 mL/min    Comment: (NOTE) Calculated using the CKD-EPI Creatinine  Equation (2021)    Anion gap 10 5 - 15    Comment: Performed at [facility], [place] [address], [place], [place] [address]  Hemoglobin A1c     Status: None   Collection Time: 04/25/21  4:01 AM  Result Value Ref Range   Hgb A1c MFr Bld 5.5 4.8 - 5.6 %    Comment: (NOTE) Pre diabetes:          5.7%-6.4%  Diabetes:              >6.4%  Glycemic control for   <7.0% adults with diabetes    Mean Plasma Glucose 111.15 mg/dL    Comment: Performed at [facility] [address], [place], [place] [address]  CBC     Status: None   Collection Time: 04/25/21  4:01 AM  Result Value Ref Range   WBC 6.3 4.0 - 10.5 K/uL   RBC 4.58 3.87 - 5.11 MIL/uL   Hemoglobin 14.0 12.0 - 15.0 g/dL   HCT 43.0 36.0 - 46.0 %   MCV 93.9 80.0 - 100.0 fL   MCH 30.6 26.0 - 34.0 pg   MCHC 32.6 30.0 - 36.0 g/dL   RDW 12.2 11.5 - 15.5 %   Platelets 319 150 - 400 K/uL   nRBC 0.0 0.0 - 0.2 %    Comment: Performed at [facility], [place] [address] [place], [place] [address]  Basic metabolic panel     Status: Abnormal   Collection Time: 04/25/21  4:01 AM  Result Value Ref Range   Sodium 139 135 - 145 mmol/L   Potassium 3.9 3.5 - 5.1 mmol/L   Chloride 107 98 - 111 mmol/L   CO2 22 22 - 32 mmol/L   Glucose, Bld 165 (H) 70 - 99 mg/dL    Comment: Glucose reference range applies only to samples taken after fasting for at least 8 hours.   BUN 11 8 - 23 mg/dL   Creatinine, Ser 0.50 0.44 - 1.00 mg/dL   Calcium 9.1 8.9 - 10.3 mg/dL   GFR, Estimated >60 >60 mL/min    Comment: (NOTE) Calculated using the CKD-EPI Creatinine Equation (2021)    Anion gap 10 5 - 15    Comment: Performed at [facility] [address], [place], [place] [address]  Magnesium     Status: None   Collection Time: 04/25/21  4:01 AM  Result Value Ref Range   Magnesium 1.9 1.7 - 2.4 mg/dL    Comment:  Performed at [facility] [address] [place], [place] [address]  Phosphorus     Status: None   Collection Time: 04/25/21  4:01 AM  Result Value Ref Range   Phosphorus 4.2 2.5 - 4.6 mg/dL    Comment: Performed at [facility] [address] [place],  [address]  CBG monitoring, ED     Status: Abnormal   Collection Time: 04/25/21  8:46 AM  Result Value Ref Range   Glucose-Capillary 146 (H) 70 - 99 mg/dL    Comment: Glucose reference range applies only to samples taken after fasting for at least 8 hours.    CT HEAD WO CONTRAST (5MM)  Result Date: 04/24/2021 CLINICAL DATA:  Seizure. EXAM: CT HEAD WITHOUT CONTRAST TECHNIQUE: Contiguous axial images were obtained from the base of the skull through the vertex without intravenous contrast. COMPARISON:  None. FINDINGS: Brain: There is mild cerebral atrophy with widening of the extra-axial spaces  and ventricular dilatation. There are areas of decreased attenuation within the white matter tracts of the supratentorial brain, consistent with microvascular disease changes. A 2.2 cm x 1.9 cm x 2.2 cm well-defined low-attenuation white matter mass (approximately 22.91 Hounsfield units) is seen within the parasagittal posterior right frontal lobe, along the vertex (axial CT images 25 through 29, CT series 3). This contains a small hemorrhagic component which measures approximately 1.2 cm x 1.0 cm x 0.6 cm). A moderate to marked amount of surrounding white matter vasogenic edema is seen. Mass effect is seen on the adjacent sulci without evidence of midline shift. Vascular: No hyperdense vessel or unexpected calcification. Skull: Normal. Negative for fracture or focal lesion. Sinuses/Orbits: No acute finding. Other: None. IMPRESSION: 1. Hemorrhagic posterior right frontal lobe mass and associated vasogenic edema, as described above. MRI correlation is recommended. Electronically Signed   By: Virgina Norfolk M.D.   On: 04/24/2021 19:19   MR  Brain W and Wo Contrast  Result Date: 04/25/2021 CLINICAL DATA:  Cancer of unknown primary, staging EXAM: MRI HEAD WITHOUT AND WITH CONTRAST TECHNIQUE: Multiplanar, multiecho pulse sequences of the brain and surrounding structures were obtained without and with intravenous contrast. CONTRAST:  5mL GADAVIST GADOBUTROL 1 MMOL/ML IV SOLN COMPARISON:  None. FINDINGS: Brain: Hemorrhagic lesion of the paramedian superior right frontal lobe measures 2.6 x 1.6 cm. There is moderate surrounding vasogenic edema. There is a hematocrit level within the lesion. Normal white matter signal, parenchymal volume and CSF spaces. The midline structures are normal. Contrast enhancement of the above described lesion is predominantly peripheral. There are no other contrast-enhancing lesions. Vascular: Major flow voids are preserved. Skull and upper cervical spine: Normal calvarium and skull base. Visualized upper cervical spine and soft tissues are normal. Sinuses/Orbits:No paranasal sinus fluid levels or advanced mucosal thickening. No mastoid or middle ear effusion. Normal orbits. IMPRESSION: Hemorrhagic lesion of the paramedian superior right frontal lobe with moderate surrounding vasogenic edema. This is most consistent with a solitary metastasis. Electronically Signed   By: Ulyses Jarred M.D.   On: 04/25/2021 00:15    Pertinent items noted in HPI and remainder of comprehensive ROS otherwise negative. Blood pressure 119/68, pulse 81, temperature 98.8 F (37.1 C), temperature source Axillary, resp. rate 17, height 5\' 6"  (1.676 m), weight 94.3 kg, SpO2 96 %.   Patient is awake and alert.  She is oriented and appropriate.  Her speech is fluent.  Her judgment insight are intact.  Cranial nerve function is normal bilaterally.  Motor examination extremities reveals some slight weakness with a pronator drift in her left upper extremity.  She has 3/5 weakness in her left lower extremity with some spasticity and some involuntary  movements.  Her right lower extremity strength is normal.  Toes are upgoing on the left.  Reflexes are difficult to ascertain on the left secondary to some spasms.  Examination head ears eyes nose throat is unremarkable.  Chest and abdomen benign.  Extremities are free from injury deformity.  Assessment/Plan:   Patient with a right parietal parasagittal mass with significant compression of her paracentral lobule and ongoing left lower extremity weakness with new onset seizure.  Imaging most consistent with metastatic lesion with some associated hemorrhage.  There is significant amount of surrounding edema.  Recommended admission with treatment with IV steroids and antiepileptic medications.  Recommend screening CT scans of her chest and abdomen to look for other evidence of neoplastic disease.  I will discuss the patient further with my partners with  regard to further treatment recommendations IG surgery verses radio surgery  Charlie Pitter 04/25/2021, 8:53 AM

## 2021-04-25 NOTE — Plan of Care (Signed)

## 2021-04-25 NOTE — Progress Notes (Signed)
Neurology Progress Note  Brief HPI: 75 y.o. female with PMHx of breast cancer, s/p chemotherapy, GERD, HLD, neuropathy,  progressive weakness of LLE over approximately one month s/p lumbar MRI with some stenosis and outpatient EMG with some degree of axonal neuropathy but without clear explanation. She presented to the ED 04/24/2021 for evaluation of left leg jerking with involvement of her left hand and shoulder pain due to her shoulder flailing about concerning for seizure activity. [place] on arrival revealed what is likely a metastatic lesion with some intratumoral hemorrhage.   Subjective: No acute overnight events noted  Per husband at bedside, he feels that her restless leg movement was improved following gabapentin overnight  Patient complains of feeling anxious and a slight frontal headache 2/10 in severity described as pressure  Exam: Vitals:   04/25/21 0600 04/25/21 0615  BP: 119/68   Pulse: 72 81  Resp: 16 17  Temp:    SpO2: 95% 96%   Gen: Laying in bed with husband at bedside, in no acute distress Resp: non-labored breathing, no respiratory distress. SpO2 96% on telemetry on room air Abd: soft, non-tender, non-distended  Neuro: Mental Status: Awake, alert, and oriented x 4.  She is able to provide a clear and coherent history of present illness.  No signs of aphasia or neglect noted. Cranial Nerves: PERRL, EOMI without ptosis or nystagmus, visual fields are full, facial sensation intact and symmetric to light touch, face appears symmetric resting and with movement, hearing is intact to voice, palate elevates symmetrically, phonation intact, shoulder shrug is symmetric, tongue protrudes midline Motor: 5/5 strength present on the right upper and lower extremity. Left upper extremity strength assessment is slightly pain-limited with 4/5 strength. Left lower extremity with 3/5 strength.  Tone and bulk are normal.  No spontaneous jerking movements, spasms, or tremors noted throughout  assessment.  Sensory: Sensation to light touch is intact and symmetric in upper and lower extremities.  DTR: 2+ and symmetric patellae and biceps Plantars: Prominent upgoing toe on the left with triple flexion Cerebellar: FNF intact without ataxia bilaterally.  Gait: Deferred  Pertinent Labs: CBC    Component Value Date/Time   WBC 6.3 04/25/2021 0401   RBC 4.58 04/25/2021 0401   HGB 14.0 04/25/2021 0401   HCT 43.0 04/25/2021 0401   PLT 319 04/25/2021 0401   MCV 93.9 04/25/2021 0401   MCH 30.6 04/25/2021 0401   MCHC 32.6 04/25/2021 0401   RDW 12.2 04/25/2021 0401   LYMPHSABS 1.2 04/24/2021 1758   MONOABS 0.6 04/24/2021 1758   EOSABS 0.0 04/24/2021 1758   BASOSABS 0.0 04/24/2021 1758   CMP     Component Value Date/Time   NA 139 04/25/2021 0401   K 3.9 04/25/2021 0401   CL 107 04/25/2021 0401   CO2 22 04/25/2021 0401   GLUCOSE 165 (H) 04/25/2021 0401   BUN 11 04/25/2021 0401   CREATININE 0.50 04/25/2021 0401   CREATININE 0.69 06/07/2020 1410   CALCIUM 9.1 04/25/2021 0401   PROT 7.1 04/24/2021 1758   ALBUMIN 4.0 04/24/2021 1758   AST 37 04/24/2021 1758   ALT 43 04/24/2021 1758   ALKPHOS 85 04/24/2021 1758   BILITOT 0.6 04/24/2021 1758   GFRNONAA >60 04/25/2021 0401   GFRNONAA 86 06/07/2020 1410   GFRAA 100 06/07/2020 1410    Imaging Reviewed:  CT Head without contrast 04/24/2021: Hemorrhagic posterior right frontal lobe mass and associated vasogenic edema, as described above. MRI correlation is recommended.  MRI Brain without contrast 04/24/2021: Hemorrhagic  lesion of the paramedian superior right frontal lobe with moderate surrounding vasogenic edema. This is most consistent with a solitary metastasis.  rEEG: This study is within normal limits. No seizures or epileptiform discharges were seen throughout the recording.  Assessment: 75 year old female with new onset seizure with metastatic brain lesion.  The spasms that she had on presentation to the ED were not felt  to be consistent with seizure activity, but rather triple flexion response with expected resolution with adequate edema response to steroids. She also has restless leg syndrome which I think is exacerbating the issue with some reported improvement following a large dose of gabapentin overnight.  - On assessment 04/25/2021 patient is without spontaneous jerking, spasms, or tremors throughout assessment.   Impression:  New onset seizure Hemorrhagic lesion of paramedian superior right frontal lobe with vasogenic edema- likely metastatic lesion with unknown primary.  Recommendations: - Continue Keppra 500 mg BID - Agree with getting CT chest, abdomen, pelvis pending for evaluation of metastatic primary - Can try gabapentin 300mg  along with home ropinorole for restless leg as needed. - EEG with no epileptiform discharges. - Inpatient seizure precautions - Ativan 2 mg IV PRN any seizure lasting > 5 minutes and notify neurology  - Neurosurgery on board for further treatment recommendations of hemorrhagic lesion. - Follow up with his neurologist outpatient. - No further inpatient neurology workup or recs. We will signoff. Please feel free to contact us with any questions or concerns.  Anibal Henderson, AGACNP-BC [facility] [phone]

## 2021-04-25 NOTE — Consult Note (Signed)
Reason for Consult:  Brain tumor Referring Physician:  emergency department  Tara Murphy is an 75 y.o. female.  HPI:  75 year old female with a 1 month history of left painless footdrop.  Patient also noting progressive weakness in her left upper and left lower extremity.  Denies any right-sided symptoms.  Yesterday the patient had the onset of simple partial seizure activity primarily involving her left leg.  This is persisted through the night.  She reports some mild headache.  She has no other complaints.  She has a remote history of breast cancer which was node negative in over 6 years ago.  She has no recent change in appetite, node change in weight, no fevers or chills.  No nausea vomiting.  No blood from a GI track.  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[place])    S/p total hip replacement left   Breast cancer ([place]) 2015   left    Cataract    Cholecystolithiasis    Chronic allergic rhinitis    Depression    Former smoker, stopped smoking in distant past 10/05/2019   Quit 2000; 63 y smoking history   GERD (gastroesophageal reflux disease)    Hepatic steatosis 06/08/2020   Intermittent elevated LFTs and ultrasound 2018   Hyperlipemia    IBS (irritable bowel syndrome) 10/08/2018   Nl colonoscopy and EGD 09/2018   IBS (irritable bowel syndrome)    Lichen plano-pilaris    Major depression, 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 Repeat 2020   Urinary tract infection    hx of frequent     Past Surgical History:  Procedure Laterality Date   APPENDECTOMY     BREAST LUMPECTOMY Left 2015   cataracts     CHOLECYSTECTOMY N/A 10/29/2016    Procedure: LAPAROSCOPIC CHOLECYSTECTOMY;  Surgeon: Erroll Luna, MD;  Location: [facility];  Service: General;  Laterality: N/A;   COLONOSCOPY     LIPOSUCTION     TOTAL HIP ARTHROPLASTY     bilateral hip arthoplasty   TOTAL HIP ARTHROPLASTY Right 12/18/2015   Procedure: RIGHT TOTAL HIP ARTHROPLASTY ANTERIOR APPROACH;  Surgeon: Gaynelle Arabian, MD;  Location: [facility];  Service: Orthopedics;  Laterality: Right;   TOTAL KNEE ARTHROPLASTY     TOTAL KNEE ARTHROPLASTY Left 11/25/2016   Procedure: LEFT TOTAL KNEE ARTHROPLASTY;  Surgeon: Gaynelle Arabian, MD;  Location: [facility];  Service: Orthopedics;  Laterality: Left;  with abductor block   TUBAL LIGATION     UPPER GASTROINTESTINAL ENDOSCOPY      Family History  Problem Relation Age of Onset   Arthritis Mother    Diabetes Mother    Crohn's disease Mother    Arthritis Father    Lung cancer Father    Breast cancer Sister 32   Hyperlipidemia Maternal Aunt    Stomach cancer Maternal Grandmother    Colitis Neg Hx    Rectal cancer Neg Hx    Esophageal cancer Neg Hx     Social History:  reports that she quit smoking about 29 years ago. Her smoking use included cigarettes. She smoked an average of 1 pack per day.  She has never used smokeless tobacco. She reports current alcohol use of about 14.0 standard drinks of alcohol per week. She reports that she does not use drugs.  Allergies:  Allergies  Allergen Reactions   Fluorescein Itching and Other (See Comments)   Ibuprofen Itching   Contrast Media [Iodinated Diagnostic Agents] Hives   Hydromorphone Hcl Nausea Only   Other Rash    Certain Band-Aids don't agree with the patient's skin; hospital ID wristband causes rashes   Sulfamethoxazole-Trimethoprim Hives   Fentanyl Other (See Comments)    Redness and flushing over body    Tape Rash    Certain Band-Aids don't agree with the patient's skin; hospital ID wristband causes rashes   Terbinafine And Related Hives and Rash    Medications: I have  reviewed the patient's current medications.  Results for orders placed or performed during the hospital encounter of 04/24/21 (from the past 48 hour(s))  CBG monitoring, ED     Status: Abnormal   Collection Time: 04/24/21  5:48 PM  Result Value Ref Range   Glucose-Capillary 100 (H) 70 - 99 mg/dL    Comment: Glucose reference range applies only to samples taken after fasting for at least 8 hours.  CBC with Differential/Platelet     Status: Abnormal   Collection Time: 04/24/21  5:58 PM  Result Value Ref Range   WBC 9.8 4.0 - 10.5 K/uL   RBC 4.71 3.87 - 5.11 MIL/uL   Hemoglobin 14.1 12.0 - 15.0 g/dL   HCT 44.7 36.0 - 46.0 %   MCV 94.9 80.0 - 100.0 fL   MCH 29.9 26.0 - 34.0 pg   MCHC 31.5 30.0 - 36.0 g/dL   RDW 12.5 11.5 - 15.5 %   Platelets 364 150 - 400 K/uL   nRBC 0.0 0.0 - 0.2 %   Neutrophils Relative % 82 %   Neutro Abs 7.9 (H) 1.7 - 7.7 K/uL   Lymphocytes Relative 12 %   Lymphs Abs 1.2 0.7 - 4.0 K/uL   Monocytes Relative 6 %   Monocytes Absolute 0.6 0.1 - 1.0 K/uL   Eosinophils Relative 0 %   Eosinophils Absolute 0.0 0.0 - 0.5 K/uL   Basophils Relative 0 %   Basophils Absolute 0.0 0.0 - 0.1 K/uL   Immature Granulocytes 0 %   Abs Immature Granulocytes 0.03 0.00 - 0.07 K/uL    Comment: Performed at [facility], [address], [place], [place] [address]  Comprehensive metabolic panel     Status: Abnormal   Collection Time: 04/24/21  5:58 PM  Result Value Ref Range   Sodium 139 135 - 145 mmol/L   Potassium 3.9 3.5 - 5.1 mmol/L   Chloride 107 98 - 111 mmol/L   CO2 22 22 - 32 mmol/L   Glucose, Bld 103 (H) 70 - 99 mg/dL    Comment: Glucose reference range applies only to samples taken after fasting for at least 8 hours.   BUN 15 8 - 23 mg/dL   Creatinine, Ser 0.74 0.44 - 1.00 mg/dL   Calcium 9.4 8.9 - 10.3 mg/dL   Total Protein 7.1 6.5 - 8.1 g/dL   Albumin 4.0 3.5 - 5.0 g/dL   AST 37 15 - 41 U/L   ALT 43 0 - 44 U/L   Alkaline Phosphatase 85 38 - 126 U/L   Total  Bilirubin 0.6 0.3 - 1.2 mg/dL   GFR, Estimated >60 >60 mL/min    Comment: (NOTE) Calculated using the CKD-EPI Creatinine  Equation (2021)    Anion gap 10 5 - 15    Comment: Performed at [facility], [place] [address] [place], [place] [address]  Hemoglobin A1c     Status: None   Collection Time: 04/25/21  4:01 AM  Result Value Ref Range   Hgb A1c MFr Bld 5.5 4.8 - 5.6 %    Comment: (NOTE) Pre diabetes:          5.7%-6.4%  Diabetes:              >6.4%  Glycemic control for   <7.0% adults with diabetes    Mean Plasma Glucose 111.15 mg/dL    Comment: Performed at [facility] [address], [place], [place] [address]  CBC     Status: None   Collection Time: 04/25/21  4:01 AM  Result Value Ref Range   WBC 6.3 4.0 - 10.5 K/uL   RBC 4.58 3.87 - 5.11 MIL/uL   Hemoglobin 14.0 12.0 - 15.0 g/dL   HCT 43.0 36.0 - 46.0 %   MCV 93.9 80.0 - 100.0 fL   MCH 30.6 26.0 - 34.0 pg   MCHC 32.6 30.0 - 36.0 g/dL   RDW 12.2 11.5 - 15.5 %   Platelets 319 150 - 400 K/uL   nRBC 0.0 0.0 - 0.2 %    Comment: Performed at [facility], [place] [address] [place], [place] [address]  Basic metabolic panel     Status: Abnormal   Collection Time: 04/25/21  4:01 AM  Result Value Ref Range   Sodium 139 135 - 145 mmol/L   Potassium 3.9 3.5 - 5.1 mmol/L   Chloride 107 98 - 111 mmol/L   CO2 22 22 - 32 mmol/L   Glucose, Bld 165 (H) 70 - 99 mg/dL    Comment: Glucose reference range applies only to samples taken after fasting for at least 8 hours.   BUN 11 8 - 23 mg/dL   Creatinine, Ser 0.50 0.44 - 1.00 mg/dL   Calcium 9.1 8.9 - 10.3 mg/dL   GFR, Estimated >60 >60 mL/min    Comment: (NOTE) Calculated using the CKD-EPI Creatinine Equation (2021)    Anion gap 10 5 - 15    Comment: Performed at [facility] [address], [place], [place] [address]  Magnesium     Status: None   Collection Time: 04/25/21  4:01 AM  Result Value Ref Range   Magnesium 1.9 1.7 - 2.4 mg/dL    Comment:  Performed at [facility] [address], [place], [place] [address]  Phosphorus     Status: None   Collection Time: 04/25/21  4:01 AM  Result Value Ref Range   Phosphorus 4.2 2.5 - 4.6 mg/dL    Comment: Performed at [facility] [address], [place], [place] [address]  CBG monitoring, ED     Status: Abnormal   Collection Time: 04/25/21  8:46 AM  Result Value Ref Range   Glucose-Capillary 146 (H) 70 - 99 mg/dL    Comment: Glucose reference range applies only to samples taken after fasting for at least 8 hours.    CT HEAD WO CONTRAST (5MM)  Result Date: 04/24/2021 CLINICAL DATA:  Seizure. EXAM: CT HEAD WITHOUT CONTRAST TECHNIQUE: Contiguous axial images were obtained from the base of the skull through the vertex without intravenous contrast. COMPARISON:  None. FINDINGS: Brain: There is mild cerebral atrophy with widening of the extra-axial spaces  and ventricular dilatation. There are areas of decreased attenuation within the white matter tracts of the supratentorial brain, consistent with microvascular disease changes. A 2.2 cm x 1.9 cm x 2.2 cm well-defined low-attenuation white matter mass (approximately 22.91 Hounsfield units) is seen within the parasagittal posterior right frontal lobe, along the vertex (axial CT images 25 through 29, CT series 3). This contains a small hemorrhagic component which measures approximately 1.2 cm x 1.0 cm x 0.6 cm). A moderate to marked amount of surrounding white matter vasogenic edema is seen. Mass effect is seen on the adjacent sulci without evidence of midline shift. Vascular: No hyperdense vessel or unexpected calcification. Skull: Normal. Negative for fracture or focal lesion. Sinuses/Orbits: No acute finding. Other: None. IMPRESSION: 1. Hemorrhagic posterior right frontal lobe mass and associated vasogenic edema, as described above. MRI correlation is recommended. Electronically Signed   By: Virgina Norfolk M.D.   On: 04/24/2021 19:19   MR  Brain W and Wo Contrast  Result Date: 04/25/2021 CLINICAL DATA:  Cancer of unknown primary, staging EXAM: MRI HEAD WITHOUT AND WITH CONTRAST TECHNIQUE: Multiplanar, multiecho pulse sequences of the brain and surrounding structures were obtained without and with intravenous contrast. CONTRAST:  29mL GADAVIST GADOBUTROL 1 MMOL/ML IV SOLN COMPARISON:  None. FINDINGS: Brain: Hemorrhagic lesion of the paramedian superior right frontal lobe measures 2.6 x 1.6 cm. There is moderate surrounding vasogenic edema. There is a hematocrit level within the lesion. Normal white matter signal, parenchymal volume and CSF spaces. The midline structures are normal. Contrast enhancement of the above described lesion is predominantly peripheral. There are no other contrast-enhancing lesions. Vascular: Major flow voids are preserved. Skull and upper cervical spine: Normal calvarium and skull base. Visualized upper cervical spine and soft tissues are normal. Sinuses/Orbits:No paranasal sinus fluid levels or advanced mucosal thickening. No mastoid or middle ear effusion. Normal orbits. IMPRESSION: Hemorrhagic lesion of the paramedian superior right frontal lobe with moderate surrounding vasogenic edema. This is most consistent with a solitary metastasis. Electronically Signed   By: Ulyses Jarred M.D.   On: 04/25/2021 00:15    Pertinent items noted in HPI and remainder of comprehensive ROS otherwise negative. Blood pressure 119/68, pulse 81, temperature 98.8 F (37.1 C), temperature source Axillary, resp. rate 17, height 5\' 6"  (1.676 m), weight 94.3 kg, SpO2 96 %.   Patient is awake and alert.  She is oriented and appropriate.  Her speech is fluent.  Her judgment insight are intact.  Cranial nerve function is normal bilaterally.  Motor examination extremities reveals some slight weakness with a pronator drift in her left upper extremity.  She has 3/5 weakness in her left lower extremity with some spasticity and some involuntary  movements.  Her right lower extremity strength is normal.  Toes are upgoing on the left.  Reflexes are difficult to ascertain on the left secondary to some spasms.  Examination head ears eyes nose throat is unremarkable.  Chest and abdomen benign.  Extremities are free from injury deformity.  Assessment/Plan:   Patient with a right parietal parasagittal mass with significant compression of her paracentral lobule and ongoing left lower extremity weakness with new onset seizure.  Imaging most consistent with metastatic lesion with some associated hemorrhage.  There is significant amount of surrounding edema.  Recommended admission with treatment with IV steroids and antiepileptic medications.  Recommend screening CT scans of her chest and abdomen to look for other evidence of neoplastic disease.  I will discuss the patient further with my partners with  regard to further treatment recommendations IG surgery verses radio surgery  Charlie Pitter 04/25/2021, 8:53 AM

## 2021-04-25 NOTE — Procedures (Signed)
Patient Name: Tara Murphy  MRN: [id number]  Epilepsy Attending: Lora Havens  Referring Physician/Provider: Dr Irene Pap Date: 04/25/2021 Duration: 22.33 mins  Patient history:  75 year old female with new onset seizure with metastatic brain lesion. EEG to evaluate for seizure  Level of alertness: Awake  AEDs during EEG study: LEV, GBP  Technical aspects: This EEG study was done with scalp electrodes positioned according to the 10-20 International system of electrode placement. Electrical activity was acquired at a sampling rate of 500Hz  and reviewed with a high frequency filter of 70Hz  and a low frequency filter of 1Hz . EEG data were recorded continuously and digitally stored.   Description: The posterior dominant rhythm consists of 9 Hz activity of moderate voltage (25-35 uV) seen predominantly in posterior head regions, symmetric and reactive to eye opening and eye closing. Hyperventilation and photic stimulation were not performed.     IMPRESSION: This study is within normal limits. No seizures or epileptiform discharges were seen throughout the recording.  Ezrael Sam Barbra Sarks

## 2021-04-25 NOTE — Progress Notes (Signed)
[facility]  EEG REPORT    NAME: Tara Murphy   MRN: [id number]  DOB: 04/27/1964  GENDER: female AGE: 57yr     SERVICE DATE: 02/21/22  STUDY DURATION: 33 minutes  STUDY NUMBER: [id number]  ORDERING PHYSICIAN: Rasmussen, Ben Gregory, DO  EEG TECHNOLOGIST: R. Bastidas  R. EEG T.  LOCATION: [facility]  EEG SYSTEM: [employer]     CLINICAL HISTORY:  75yr old female who is having this EEG done for abnormal spell.    Other Data   MEDICATIONS:  Current Outpatient Medications   Medication Sig Dispense Refill    Albuterol (PROAIR HFA, PROVENTIL HFA, VENTOLIN HFA) 90 mcg/actuation inhaler Take 1-2 puffs by inhalation every 4 hours if needed for wheezing. 17 g 1    Allopurinol (ZYLOPRIM) 300 mg Tablet TAKE ONE TABLET BY MOUTH EVERY DAY 90 tablet 1    Aspirin 81 mg Chewable Tablet Take 1 tablet by mouth every day. 30 tablet 0    Atorvastatin (LIPITOR) 40 mg tablet TAKE ONE TABLET BY MOUTH AT BEDTIME 90 tablet 1    Azelaic Acid (FINACEA) 15 % Gel Apply to forehead, cheeks, nose, and chin twice daily 50 g 3    Azelastine Nasal (ASTELIN) 137 mcg (0.1 %) Spray Instill 2 sprays into EACH nostril 2 times daily. 30 mL 6    Chlorthalidone (THALITONE) 25 mg Tablet TAKE ONE TABLET BY MOUTH EVERY DAY 30 tablet 5    Diclofenac (VOLTAREN) 1 % Gel Apply 2 g to the affected area three times daily if needed. 100 g 1    FLOVENT HFA 110 mcg/actuation Inhaler Take 1 puff by inhalation every day. 12 g 1    Fluticasone (FLONASE) 50 mcg/actuation nasal spray Instill 1 spray into EACH nostril every day. Use after performing nasal saline irrigations 16 g 11    Gabapentin (NEURONTIN) 300 mg Capsule TAKE ONE CAPSULE BY MOUTH EVERY MORNING AND TAKE TWO CAPSULES BY MOUTH EVERY EVENING 90 capsule 5    Losartan (COZAAR) 100 mg tablet TAKE ONE TABLET BY MOUTH EVERY DAY 30 tablet 5    Pantoprazole (PROTONIX) 40 mg Delayed Release Tablet TAKE ONE TABLET BY MOUTH EVERY MORNING BEFORE A MEAL 90 tablet 0    Prazosin (MINIPRESS) 5 mg Capsule Take 1 capsule by mouth  every day at bedtime. 90 capsule 1    Trazodone (DESYREL) 50 mg Tablet TAKE ONE TABLET BY MOUTH AT BEDTIME AS NEEDED 30 tablet 5     No current facility-administered medications for this visit.       TECHNICAL DESCRIPTION:  This digital video EEG was performed using the standard 10-20 system of electrode placement and one channel of EKG.           EEG DESCRIPTION:    Clinical State: Awake  Background:  9-10 Hz; posterior head regions; symmetric, waxing and waning, reactive to eye opening and closure  Beta: 18-22 Hz; frontocentral, symmetric, waxing and waning        No stage II/N2 sleep was recorded    ACTIVATION:  Hyperventilation was performed for 3-minutes, producing no abnormal discharges  Photic stimulation was performed at frequencies ranging from 1-60 Hz, producing no abnormal discharges           IMPRESSION: Normal        CLINICAL CORRELATION:  This EEG in the awake state is normal. There are no focal, lateralized or epileptiform abnormalities.   No episodes of concern were captured.      Palak   Parikh, MD

## 2021-04-25 NOTE — Consult Note (Signed)
Neurology Consultation Reason for Consult: Seizure Referring Physician: Maryan Rued, W  CC: Left leg spasms  History is obtained from:Patient, family  HPI: Tara Murphy is a 75 y.o. female with a history of progressive weakness of her left lower extremity that initially began in late June.  She states that she started noticing that she was dragging her left leg she was evaluated with a lumbar MRI which showed some stenosis, but no clear explanation.  She had an EMG done as well with some degree of axonal neuropathy.  She was seen by Yetta Glassman neurosurgery at [facility] who was concerned for possible acute neuropathy.  This afternoon, however, she had an episode of left leg jerking.  She had some involvement of her hand as well and complained that her shoulder was hurting because it had been flailing about.  This lasted for a few minutes, and she was brought into the emergency department where a CT of her head reveals what is likely a metastatic tumor with some intratumoral hemorrhage.    ROS: A 14 point ROS was performed and is negative except as noted in the HPI.   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[place])    S/p total hip replacement left   Breast cancer ([place]) 2015   left    Cataract    Cholecystolithiasis    Chronic allergic rhinitis    Depression    Former smoker, stopped smoking in distant past 10/05/2019   Quit 2000; 58 y smoking history   GERD (gastroesophageal reflux disease)    Hepatic steatosis 06/08/2020   Intermittent elevated LFTs and ultrasound 2018   Hyperlipemia    IBS (irritable bowel syndrome) 10/08/2018   Nl colonoscopy and EGD 09/2018   IBS (irritable bowel syndrome)    Lichen plano-pilaris    Major depression, 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 Repeat 2020   Urinary tract infection    hx of frequent      Family History  Problem Relation Age of Onset   Arthritis Mother    Diabetes Mother    Crohn's disease Mother    Arthritis Father    Lung cancer Father    Breast cancer Sister 46   Hyperlipidemia Maternal Aunt    Stomach cancer Maternal Grandmother    Colitis Neg Hx    Rectal cancer Neg Hx    Esophageal cancer Neg Hx      Social History:  reports that she quit smoking about 29 years ago. Her smoking use included cigarettes. She smoked an average of 1 pack per day. She has never used smokeless tobacco. She reports current alcohol use of about 14.0 standard drinks of alcohol per week. She reports that she does not use drugs.   Exam: Current vital signs: BP 133/67 (BP Location: Right Arm)   Pulse 73   Temp 98.8 F (37.1 C) (Axillary)   Resp 17   Ht 5\' 6"  (1.676 m)   Wt 94.3 kg   SpO2 97%   BMI 33.57 kg/m  Vital signs in last 24 hours: Temp:  [97.9 F (36.6 C)-98.8 F (37.1 C)] 98.8  F (37.1 C) (08/03 0058) Pulse Rate:  [73-103] 73 (08/03 0058) Resp:  [12-23] 17 (08/03 0058) BP: (133-154)/(67-91) 133/67 (08/03 0058) SpO2:  [91 %-97 %] 97 % (08/03 0058) Weight:  [94.3 kg] 94.3 kg (08/02 1807)   Physical Exam  Constitutional: Appears well-developed and well-nourished.  Psych: Affect appropriate to situation Eyes: No scleral injection HENT: No OP obstruction MSK: no joint deformities.  Cardiovascular: Normal rate and regular rhythm.  Respiratory: Effort normal, non-labored breathing GI: Soft.  No distension. There is no tenderness.  Skin: WDI  Neuro: Mental Status: Patient is awake, alert, oriented to person, place, month, year, and situation. Patient is able to give a clear and coherent history. No signs of aphasia or neglect Cranial Nerves: II: Visual Fields are  full. Pupils are equal, round, and reactive to light.   III,IV, VI: EOMI without ptosis or diploplia.  V: Facial sensation is diminshed on the left VII: Facial movement with mild facial weakness VIII: hearing is intact to voice X: Uvula elevates symmetrically XI: Shoulder shrug is symmetric. XII: tongue is midline without atrophy or fasciculations.  Motor: Tone is normal. Bulk is normal. 5/5 strength was present on the right, 4/5 in left arm, 3/5 in left leg Sensory: Sensation is diminished on the left, she does not extinguish Deep Tendon Reflexes: 2+ and symmetric in the biceps and patellae.  She has a prominent upgoing toe on the left with triple flexion Cerebellar: No clear ataxia on the right.   She frequently has spontaneous triple flexion, though usually this is accompanied by some movement of her upper body or legs make me think that she is stimulating it and provoking it.  There is no rhythmicity or other character to make me suspect an ictal nature   I have reviewed labs in epic and the results pertinent to this consultation are: Normal BMP Normal CBC  I have reviewed the images obtained: CT head-hemorrhagic metastatic appearing lesion with significant edema  Impression: 75 year old female with new onset seizure with metastatic brain lesion.  The spasms that she is currently having do not appear to be seizure to me, but rather triple flexion response.  I suspect that as her edema goes down with steroids, this may improve.  She also has restless leg syndrome which I think is exacerbating the issue, and she may benefit from a large dose of gabapentin.  Recommendations: 1) gabapentin 800 mg x 1 2) continue Keppra 500 mg twice daily 3) MRI brain with and without contrast 4) agree with CT chest abdomen pelvis to evaluate for metastatic primary 5) EEG 6) neurology will follow   Roland Rack, MD [facility] [phone]  If 7pm- 7am, please page  neurology on call as listed in [place].

## 2021-04-25 NOTE — Progress Notes (Signed)
PROGRESS NOTE    Tara Murphy  TTS:[id number] DOB: 11/21/1945 DOA: 04/24/2021 PCP: Leamon Arnt, MD    Chief Complaint  Patient presents with   Seizures   Extremity Weakness    Brief Narrative:  Is a no charge note as patient was seen and admitted earlier today per Dr. Kipp Brood, patient was seen and examined, imaging, labs and chart were reviewed.     Tara Murphy is a 75 y.o. female with medical history significant for left breast cancer status postlumpectomy in 2015, left foot drop, EMG diagnosis of peripheral neuropathy, hyperlipidemia, GERD, chronic anxiety/depression, herpes zoster, former smoker, restless leg syndrome, IBS, who presented to [facility] ED from home after having a seizure like activity, likely focal with left lower and left upper extremity uncoordinated uncontrolled jerky movements, witnessed by her husband who is at bedside, accompanied by their daughter.   CT head shows a brain tumor 2 x 2 cm hemorrhagic posterior right frontal lobe mass and associated vasogenic edema.  She received loading dose of Keppra 1500 mg x 1, IV Decadron 10 mg x 1, had another seizure-like activity while in the ED, involving her left lower extremity with jerky movements.  She received 2 mg of IV Ativan.  Started on IV fluid hydration.  EDP discussed case with neurology who recommended MRI brain with and without contrast, and to obtain CT chest/abdomen/pelvis to r/o other sites of tumor.  Patient admitted under hospitalist service.    Assessment & Plan:   Active Problems:   GERD (gastroesophageal reflux disease)   Seizure (HCC)   Brain mass   New seizure activity in the setting of newly diagnosed brain tumor. 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 -Neurology input greatly appreciated, she was loaded with IV Keppra, continue with Keppra 100 mg twice  daily.. -Lesion with surrounding vasogenic edema, continue with IV Decadron 4 mg IV every 6, will keep on PPI prophylaxis as well. -On as needed Ativan for any seizures activity. -Neurology consult appreciated - EEG with no epileptiform discharges. - Inpatient seizure precautions  Newly diagnosed brain tumor with prior history of breast cancer (2014/2015) EDP discussed case with neurology who recommended MRI brain with and without contrast, and to obtain CT chest/abdomen/pelvis to r/o other sites of tumor.   CT head shows a brain tumor 2.2 cm x 2 cm hemorrhagic posterior right frontal lobe mass and associated vasogenic edema.  No midline shift.  Repeat CT head in the AM She received loading dose of Keppra 1500 mg x 1, IV Decadron 10 mg x 1 IV Decadron 4 mg q 6H -At recent normal colonoscopy, reports quit smoking 1993, she reports no recent Pap smear done.   Elevated BP Not on oral antihypertensive prior to admission BP goal normotensive, less than 130/80 due to hemorrhagic mass IV Labetalol PRN with parameters Closely monitor vital signs   Chronic anxiety/depression On Buspar prior to admission -He was started on as needed Xanax.   Ambulatory dysfunction Gait abnormality Left foot drop PT OT evaluation when stable Fall precautions   DVT prophylaxis: scd Code Status: Full Family Communication: Husband at bedSide Disposition:   Status is: Inpatient  Remains inpatient appropriate because:IV treatments appropriate due to intensity of illness or inability to take PO  Dispo: The patient is from: Home              Anticipated d/c is to: Home  Patient currently is not medically stable to d/c.   Difficult to place patient No       Consultants:  neurology   Subjective:  Reports she is having some anxiety, reports twitching in left lower extremity has significantly improved with IV steroids  Objective: Vitals:   04/25/21 0800 04/25/21 0900 04/25/21 1100  04/25/21 1515  BP: 131/69 123/75 131/79 139/75  Pulse: 79 85 82 78  Resp: _0 Temp:      TempSrc:      SpO2: 94% 94% 94% 93%  Weight:      Height:       No intake or output data in the 24 hours ending 04/25/21 1522 Filed Weights   04/24/21 1807  Weight: 94.3 kg    Examination:  General exam: Appears calm and comfortable  Respiratory system: Clear to auscultation. Respiratory effort normal. Cardiovascular system: S1 & S2 heard, RRR. No JVD, murmurs, rubs, gallops or clicks. No pedal edema. Gastrointestinal system: Abdomen is nondistended, soft and nontender. No organomegaly or masses felt. Normal bowel sounds heard. Central nervous system: Alert and oriented. No focal neurological deficits. Extremities: Upper extremity 4/5, left lower extremity 3/5 Skin: No rashes, lesions or ulcers Psychiatry: Judgement and insight appear normal. Mood & affect appropriate.     Data Reviewed: I have personally reviewed following labs and imaging studies  CBC: Recent Labs  Lab 04/24/21 1758 04/25/21 0401  WBC 9.8 6.3  NEUTROABS 7.9*  --   HGB 14.1 14.0  HCT 44.7 43.0  MCV 94.9 93.9  PLT 364 032    Basic Metabolic Panel: Recent Labs  Lab 04/24/21 1758 04/25/21 0401  NA 139 139  K 3.9 3.9  CL 107 107  CO2 22 22  GLUCOSE 103* 165*  BUN 15 11  CREATININE 0.74 0.50  CALCIUM 9.4 9.1  MG  --  1.9  PHOS  --  4.2    GFR: Estimated Creatinine Clearance: 71.4 mL/min (by C-G formula based on SCr of 0.5 mg/dL).  Liver Function Tests: Recent Labs  Lab 04/24/21 1758  AST 37  ALT 43  ALKPHOS 85  BILITOT 0.6  PROT 7.1  ALBUMIN 4.0    CBG: Recent Labs  Lab 04/24/21 1748 04/25/21 0846 04/25/21 1221  GLUCAP 100* 146* 135*     Recent Results (from the past 240 hour(s))  Urine Culture     Status: None   Collection Time: 04/16/21  3:07 PM   Specimen: Urine  Result Value Ref Range Status   MICRO NUMBER: [id number]  Final   SPECIMEN QUALITY: Adequate  Final    Sample Source NOT GIVEN  Final   STATUS: FINAL  Final   ISOLATE 1:   Final    Mixed genital flora isolated. These superficial bacteria are not indicative of a urinary tract infection. No further organism identification is warranted on this specimen. If clinically indicated, recollect clean-catch, mid-stream urine and transfer  immediately to Urine Culture Transport Tube.          Radiology Studies: CT HEAD WO CONTRAST (5MM)  Result Date: 04/24/2021 CLINICAL DATA:  Seizure. EXAM: CT HEAD WITHOUT CONTRAST TECHNIQUE: Contiguous axial images were obtained from the base of the skull through the vertex without intravenous contrast. COMPARISON:  None. FINDINGS: Brain: There is mild cerebral atrophy with widening of the extra-axial spaces and ventricular dilatation. There are areas of decreased attenuation within the white matter tracts of the supratentorial brain, consistent with microvascular disease changes. A 2.2 cm  x 1.9 cm x 2.2 cm well-defined low-attenuation white matter mass (approximately 22.91 Hounsfield units) is seen within the parasagittal posterior right frontal lobe, along the vertex (axial CT images 25 through 29, CT series 3). This contains a small hemorrhagic component which measures approximately 1.2 cm x 1.0 cm x 0.6 cm). A moderate to marked amount of surrounding white matter vasogenic edema is seen. Mass effect is seen on the adjacent sulci without evidence of midline shift. Vascular: No hyperdense vessel or unexpected calcification. Skull: Normal. Negative for fracture or focal lesion. Sinuses/Orbits: No acute finding. Other: None. IMPRESSION: 1. Hemorrhagic posterior right frontal lobe mass and associated vasogenic edema, as described above. MRI correlation is recommended. Electronically Signed   By: Virgina Norfolk M.D.   On: 04/24/2021 19:19   MR Brain W and Wo Contrast  Result Date: 04/25/2021 CLINICAL DATA:  Cancer of unknown primary, staging EXAM: MRI HEAD WITHOUT AND WITH  CONTRAST TECHNIQUE: Multiplanar, multiecho pulse sequences of the brain and surrounding structures were obtained without and with intravenous contrast. CONTRAST:  70m GADAVIST GADOBUTROL 1 MMOL/ML IV SOLN COMPARISON:  None. FINDINGS: Brain: Hemorrhagic lesion of the paramedian superior right frontal lobe measures 2.6 x 1.6 cm. There is moderate surrounding vasogenic edema. There is a hematocrit level within the lesion. Normal white matter signal, parenchymal volume and CSF spaces. The midline structures are normal. Contrast enhancement of the above described lesion is predominantly peripheral. There are no other contrast-enhancing lesions. Vascular: Major flow voids are preserved. Skull and upper cervical spine: Normal calvarium and skull base. Visualized upper cervical spine and soft tissues are normal. Sinuses/Orbits:No paranasal sinus fluid levels or advanced mucosal thickening. No mastoid or middle ear effusion. Normal orbits. IMPRESSION: Hemorrhagic lesion of the paramedian superior right frontal lobe with moderate surrounding vasogenic edema. This is most consistent with a solitary metastasis. Electronically Signed   By: KUlyses JarredM.D.   On: 04/25/2021 00:15   EEG adult  Result Date: 04/25/2021 YLora Havens MD     04/25/2021 12:46 PM Patient Name: Tara WaldvogelMRN: [id number] Attending: PLora HavensReferring Physician/Provider: Dr CIrene PapDate: 04/25/2021 Duration: 22.33 mins Patient history:  75year old female with new onset seizure with metastatic brain lesion. EEG to evaluate for seizure Level of alertness: Awake AEDs during EEG study: LEV, GBP Technical aspects: This EEG study was done with scalp electrodes positioned according to the 10-20 International system of electrode placement. Electrical activity was acquired at a sampling rate of _0  and reviewed with a high frequency filter of _1  and a low frequency filter of _2 . EEG data were recorded continuously and digitally  stored. Description: The posterior dominant rhythm consists of 9 Hz activity of moderate voltage (25-35 uV) seen predominantly in posterior head regions, symmetric and reactive to eye opening and eye closing. Hyperventilation and photic stimulation were not performed.   IMPRESSION: This study is within normal limits. No seizures or epileptiform discharges were seen throughout the recording. Priyanka OBarbra Sarks       Scheduled Meds:  chlorhexidine gluconate (MEDLINE KIT)  15 mL Mouth Rinse BID   dexamethasone (DECADRON) injection  4 mg Intravenous Q6H   diphenhydrAMINE  50 mg Oral Once   Or   diphenhydrAMINE  50 mg Intravenous Once   gabapentin  800 mg Oral Once   insulin aspart  0-5 Units Subcutaneous QHS   insulin aspart  0-9 Units Subcutaneous TID WC   mouth rinse  15 mL Mouth Rinse 10 times per  day   pantoprazole (PROTONIX) IV  40 mg Intravenous Q24H   predniSONE  50 mg Oral Q6H   Continuous Infusions:  sodium chloride 30 mL/hr at 04/25/21 0233   levETIRAcetam       LOS: 1 day      Phillips Climes, MD [facility]   To contact the attending provider between 7A-7P or the covering provider during after hours 7P-7A, please log into the web site [email or web address] and access using universal [place] password for that web site. If you do not have the password, please call the hospital operator.  04/25/2021, 3:22 PM

## 2021-04-26 ENCOUNTER — Inpatient Hospital Stay (HOSPITAL_COMMUNITY): Payer: Medicare Other

## 2021-04-26 ENCOUNTER — Other Ambulatory Visit: Payer: Self-pay | Admitting: Neurosurgery

## 2021-04-26 ENCOUNTER — Ambulatory Visit (HOSPITAL_BASED_OUTPATIENT_CLINIC_OR_DEPARTMENT_OTHER): Payer: Medicare Other | Admitting: Physical Therapy

## 2021-04-26 DIAGNOSIS — C7931 Secondary malignant neoplasm of brain: Principal | ICD-10-CM

## 2021-04-26 DIAGNOSIS — Z853 Personal history of malignant neoplasm of breast: Secondary | ICD-10-CM | POA: Diagnosis not present

## 2021-04-26 DIAGNOSIS — G9389 Other specified disorders of brain: Secondary | ICD-10-CM

## 2021-04-26 DIAGNOSIS — R911 Solitary pulmonary nodule: Secondary | ICD-10-CM

## 2021-04-26 LAB — COMPREHENSIVE METABOLIC PANEL
ALT: 34 U/L (ref 0–44)
AST: 20 U/L (ref 15–41)
Albumin: 3.4 g/dL — ABNORMAL LOW (ref 3.5–5.0)
Alkaline Phosphatase: 78 U/L (ref 38–126)
Anion gap: 11 (ref 5–15)
BUN: 15 mg/dL (ref 8–23)
CO2: 23 mmol/L (ref 22–32)
Calcium: 9.8 mg/dL (ref 8.9–10.3)
Chloride: 104 mmol/L (ref 98–111)
Creatinine, Ser: 0.61 mg/dL (ref 0.44–1.00)
GFR, Estimated: >60 mL/min (ref >60)
Glucose, Bld: 148 mg/dL — ABNORMAL HIGH (ref 70–99)
Potassium: 4 mmol/L (ref 3.5–5.1)
Sodium: 138 mmol/L (ref 135–145)
Total Bilirubin: 0.5 mg/dL (ref 0.3–1.2)
Total Protein: 6.1 g/dL — ABNORMAL LOW (ref 6.5–8.1)

## 2021-04-26 LAB — CBC
HCT: 39 % (ref 36.0–46.0)
Hemoglobin: 13 g/dL (ref 12.0–15.0)
MCH: 30.5 pg (ref 26.0–34.0)
MCHC: 33.3 g/dL (ref 30.0–36.0)
MCV: 91.5 fL (ref 80.0–100.0)
Platelets: 347 10*3/uL (ref 150–400)
RBC: 4.26 MIL/uL (ref 3.87–5.11)
RDW: 12.4 % (ref 11.5–15.5)
WBC: 13.2 10*3/uL — ABNORMAL HIGH (ref 4.0–10.5)
nRBC: 0 % (ref 0.0–0.2)

## 2021-04-26 LAB — GLUCOSE, CAPILLARY
Glucose-Capillary: 156 mg/dL — ABNORMAL HIGH (ref 70–99)
Glucose-Capillary: 131 mg/dL — ABNORMAL HIGH (ref 70–99)
Glucose-Capillary: 161 mg/dL — ABNORMAL HIGH (ref 70–99)
Glucose-Capillary: 177 mg/dL — ABNORMAL HIGH (ref 70–99)

## 2021-04-26 MED ORDER — LORAZEPAM 1 MG PO TABS
1.0000 mg | ORAL_TABLET | Freq: Four times a day (QID) | ORAL | Status: DC | PRN
  Administered 2021-04-30 – 2021-05-02 (×3): 1 mg via ORAL
  Filled 2021-04-26 (×3): qty 1

## 2021-04-26 MED ORDER — SODIUM CHLORIDE 0.9 % IV SOLN
1500.0000 mg | Freq: Once | INTRAVENOUS | Status: AC
  Administered 2021-04-26: 1500 mg via INTRAVENOUS
  Filled 2021-04-26: qty 30

## 2021-04-26 MED ORDER — LORAZEPAM 1 MG PO TABS
2.0000 mg | ORAL_TABLET | Freq: Once | ORAL | Status: AC
  Administered 2021-04-26: 2 mg via ORAL
  Filled 2021-04-26: qty 2

## 2021-04-26 MED ORDER — POLYETHYLENE GLYCOL 3350 17 G PO PACK
17.0000 g | PACK | Freq: Every day | ORAL | Status: DC | PRN
  Administered 2021-05-02: 17 g via ORAL
  Filled 2021-04-26: qty 1

## 2021-04-26 MED ORDER — LORAZEPAM 2 MG/ML IJ SOLN: 2.0000 mg | INTRAMUSCULAR | Status: DC | PRN

## 2021-04-26 MED ORDER — LEVETIRACETAM IN NACL 1000 MG/100ML IV SOLN
1000.0000 mg | Freq: Two times a day (BID) | INTRAVENOUS | Status: DC
  Administered 2021-04-27 – 2021-05-01 (×9): 1000 mg via INTRAVENOUS
  Filled 2021-04-26 (×8): qty 100

## 2021-04-26 NOTE — Progress Notes (Signed)
LTM maint complete - [facility] monitored, Event button test confirmed by [facility].

## 2021-04-26 NOTE — Plan of Care (Signed)

## 2021-04-26 NOTE — Progress Notes (Signed)
Ok to increase the current dose of Keppra to 1000mg  IV q12 per Dr. Hortense Ramal.  Onnie Boer, PharmD, BCIDP, AAHIVP, CPP Infectious Disease Pharmacist 04/26/2021 12:58 PM

## 2021-04-26 NOTE — Consult Note (Signed)
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  She had imaging which showed 2 x 2 centimeter hemorrhagic posterior right frontal lobe mass and associated vasogenic edema.  Systemic imaging showed large spiculated left upper lobe suprahilar mass most consistent with primary bronchogenic malignancy, indeterminate 8 mm right upper lobe pulmonary nodule.  I have discussed with the patient, family as well as the internist about the recommendations.  I would recommend proceeding with neurosurgical consult for the brain mass and evaluation of surgical specimen to accurately interpret the pathology.  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  I discussed advanced nature of the lung cancer however since this appears to be oligometastatic, I agree with surgical resection of the brain mass, obtain pathology and also sent for molecular testing to see if we can consider any targeted therapy. I will engage our nurse navigator team to coordinate molecular testing of the specimen obtained from the brain tumor resection. I have discussed that the intent of treatment is not curative in this case since this is considered metastatic disease however, we will discuss more about chemotherapy/targeted therapy based on the molecular testing results and reevaluate future role of SBRT if the lesions remain confined to the lung and there  is no other evidence of metastatic disease.  Discussed briefly about the new changing paradigm for oligometastatic lung cancers.  She and her family are in agreement, they agree with surgical evaluation and removal of the brain mass and outpatient follow-up with medical oncology.  Have discussed plan of care with referring team as well. Thank you for consulting [name] in the care of this patient.  Please not hesitate to contact us with any additional questions or concerns.Message sent to NN team for molecular testing and scheduling team for outpatient follow up. Referral MD  Reason for Referral: Brain mass and lung mass  Chief Complaint  Patient presents with   Seizures   Extremity Weakness    HPI:   This is a very pleasant 75 year old female with a past medical history significant for left breast lower outer quadrant invasive ductal carcinoma grade 1, 1.1 cm with intermediate grade DCIS, negative margins, 0 out of 2 sentinel lymph nodes negative, pathologic staging T1CN0, Oncotype DX score of 8, ER/PR positive HER2 negative could not tolerate antiestrogen therapy because of severe emotional changes, fatigue and episodes of depression who presented to the hospital today with seizure-like activity.  She apparently started having some uncoordinated uncontrolled jerking movements of the left lower and left upper extremity witnessed by her husband. CT head shows a brain tumor 2 x 2 centimeter hemorrhagic posterior right frontal lobe mass and associated vasogenic edema. Also had CT chest abdomen pelvis which showed large spiculated left upper lobe suprahilar mass most consistent with primary bronchogenic malignancy.  Indeterminate 8 mm right upper lobe pulmonary nodule metastatic disease cannot be excluded, attention on follow-up recommended.  Medical oncology was consulted for additional recommendations. MR brain showed hemorrhagic lesion of the paramedian  superior right frontal lobe with moderate  surrounding vasogenic edema. According to patient, she has been developing some left lower extremity and upper extremity weakness for the past 2weeks, no major headaches.  No other neurological complaints except for imbalance because of the weakness on the left side.  She continues to be diligent with mammograms and recently had a mammogram which was unremarkable according to the patient. Besides these complaints, she denies any new cough, chest pain, shortness of breath, major weight loss, change in bowel habits or urinary habits.  She smoked about 30 pack years, quit in 1993. Rest of the pertinent 10 point ROS reviewed and negative.  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[place])    S/p total hip replacement left   Breast cancer ([place]) 2015   left    Cataract    Cholecystolithiasis    Chronic allergic rhinitis    Depression    Former smoker, stopped smoking in distant past 10/05/2019   Quit 2000; 54 y smoking history   GERD (gastroesophageal reflux disease)    Hepatic steatosis 06/08/2020   Intermittent elevated LFTs and ultrasound 2018   Hyperlipemia    IBS (irritable bowel syndrome) 10/08/2018   Nl colonoscopy and EGD 09/2018   IBS (irritable bowel syndrome)    Lichen plano-pilaris    Major depression, 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 Repeat 2020   Urinary tract infection    hx of frequent   :   Past Surgical History:  Procedure Laterality Date   APPENDECTOMY     BREAST LUMPECTOMY Left 2015   cataracts     CHOLECYSTECTOMY N/A 10/29/2016   Procedure: LAPAROSCOPIC CHOLECYSTECTOMY;  Surgeon: Erroll Luna, MD;  Location: [facility];  Service:  General;  Laterality: N/A;   COLONOSCOPY     LIPOSUCTION     TOTAL HIP ARTHROPLASTY     bilateral hip arthoplasty   TOTAL HIP ARTHROPLASTY Right 12/18/2015   Procedure: RIGHT TOTAL HIP ARTHROPLASTY ANTERIOR APPROACH;  Surgeon: Gaynelle Arabian, MD;  Location: [facility];  Service: Orthopedics;  Laterality: Right;   TOTAL KNEE ARTHROPLASTY     TOTAL KNEE ARTHROPLASTY Left 11/25/2016   Procedure: LEFT TOTAL KNEE ARTHROPLASTY;  Surgeon: Gaynelle Arabian, MD;  Location: [facility];  Service: Orthopedics;  Laterality: Left;  with abductor block   TUBAL LIGATION     UPPER GASTROINTESTINAL ENDOSCOPY    :   Current Facility-Administered Medications  Medication Dose Route Frequency Provider Last Rate Last Admin   chlorhexidine gluconate (MEDLINE KIT) (PERIDEX) 0.12 % solution 15 mL  15 mL Mouth Rinse BID Irene Pap N, DO   15 mL at 04/26/21 1208   dexamethasone (DECADRON) injection 4 mg  4 mg Intravenous Q6H Irene Pap N, DO   4 mg at 04/26/21 1207   diphenhydrAMINE (BENADRYL) capsule 50 mg  50 mg Oral Once Kayleen Memos, DO       Or   diphenhydrAMINE (BENADRYL) injection 50 mg  50 mg Intravenous Once Hall, Carole N, DO       gabapentin (NEURONTIN) capsule 600 mg  600 mg Oral BID Elgergawy, Silver Huguenin,  MD   600 mg at 04/26/21 1150   gabapentin (NEURONTIN) capsule 800 mg  800 mg Oral Once Hall, Carole N, DO       insulin aspart (novoLOG) injection 0-5 Units  0-5 Units Subcutaneous QHS Hall, Carole N, DO       insulin aspart (novoLOG) injection 0-9 Units  0-9 Units Subcutaneous TID WC Irene Pap N, DO   1 Units at 04/26/21 1219   labetalol (NORMODYNE) injection 5 mg  5 mg Intravenous Q2H PRN Irene Pap N, DO   5 mg at 04/24/21 2124   levETIRAcetam (KEPPRA) IVPB 1000 mg/100 mL premix  1,000 mg Intravenous Q12H Pham, Minh Q, RPH-CPP       LORazepam (ATIVAN) injection 2 mg  2 mg Intravenous Q4H PRN Lora Havens, MD       LORazepam (ATIVAN) tablet 1 mg  1 mg Oral Q6H PRN Lora Havens, MD       MEDLINE  mouth rinse  15 mL Mouth Rinse 10 times per day Kayleen Memos, DO   15 mL at 04/26/21 1329   pantoprazole (PROTONIX) injection 40 mg  40 mg Intravenous Q24H Elgergawy, Silver Huguenin, MD   40 mg at 04/26/21 1151   polyethylene glycol (MIRALAX / GLYCOLAX) packet 17 g  17 g Oral Daily PRN Elgergawy, Silver Huguenin, MD       rOPINIRole (REQUIP) tablet 4 mg  4 mg Oral QHS Elgergawy, Silver Huguenin, MD   4 mg at 04/25/21 2151      Allergies  Allergen Reactions   Fluorescein Itching and Other (See Comments)   Ibuprofen Itching   Contrast Media [Iodinated Diagnostic Agents] Hives   Hydromorphone Hcl Nausea Only   Other Rash    Certain Band-Aids don't agree with the patient's skin; hospital ID wristband causes rashes   Sulfamethoxazole-Trimethoprim Hives   Fentanyl Other (See Comments)    Redness and flushing over body    Tape Rash    Certain Band-Aids don't agree with the patient's skin; hospital ID wristband causes rashes   Terbinafine And Related Hives and Rash  :   Family History  Problem Relation Age of Onset   Arthritis Mother    Diabetes Mother    Crohn's disease Mother    Arthritis Father    Lung cancer Father    Breast cancer Sister 31   Hyperlipidemia Maternal Aunt    Stomach cancer Maternal Grandmother    Colitis Neg Hx    Rectal cancer Neg Hx    Esophageal cancer Neg Hx   :   Social History   Socioeconomic History   Marital status: Married    Spouse name: Not on file   Number of children: 2   Years of education: Not on file   Highest education level: Not on file  Occupational History   Occupation: Retired     Comment: Oceanographer   Tobacco Use   Smoking status: Former    Packs/day: 1.00    Types: Cigarettes    Quit date: 11/22/1991    Years since quitting: 29.4   Smokeless tobacco: Never  Vaping Use   Vaping Use: Never used  Substance and Sexual Activity   Alcohol use: Yes    Alcohol/week: 14.0 standard drinks    Types: 14 Glasses of wine per week   Drug use:  No   Sexual activity: Not Currently    Birth control/protection: Post-menopausal  Other Topics Concern   Not on file  Social History  Narrative   4 grandchildren and 3 step    Enjoys painting, and gardening, and home decorating    Right Handed   Has a [place] Puppy    Lives one story home    Social Determinants of Health   Financial Resource Strain: Not on file  Food Insecurity: Not on file  Transportation Needs: Not on file  Physical Activity: Not on file  Stress: Not on file  Social Connections: Not on file  Intimate Partner Violence: Not on file   Exam: Patient Vitals for the past 24 hrs:  BP Temp Temp src Pulse Resp SpO2  04/26/21 1149 123/61 97.8 F (36.6 C) Oral 66 19 98 %  04/26/21 0742 119/60 98.2 F (36.8 C) Oral 71 17 95 %  04/26/21 0405 123/60 97.7 F (36.5 C) Oral 73 17 93 %  04/25/21 2322 (!) 110/55 98.5 F (36.9 C) Oral 72 17 92 %  04/25/21 2044 129/62 99.2 F (37.3 C) Oral 81 17 95 %  04/25/21 1709 134/76 98.4 F (36.9 C) Oral 82 18 96 %  04/25/21 1515 139/75 -- -- 78 17 93 %   Physical Exam Constitutional:      Appearance: Normal appearance.  HENT:     Head: Normocephalic and atraumatic.  Cardiovascular:     Rate and Rhythm: Normal rate and regular rhythm.     Pulses: Normal pulses.     Heart sounds: Normal heart sounds.  Pulmonary:     Effort: Pulmonary effort is normal.     Breath sounds: Normal breath sounds.  Abdominal:     General: Abdomen is flat.     Palpations: Abdomen is soft.  Musculoskeletal:        General: No swelling.     Cervical back: Normal range of motion and neck supple. No rigidity or tenderness.  Lymphadenopathy:     Cervical: No cervical adenopathy.  Skin:    General: Skin is warm and dry.     Coloration: Skin is not jaundiced.  Neurological:     Mental Status: She is alert.     Motor: Weakness present.  Psychiatric:        Mood and Affect: Mood normal.      Lab Results  Component Value Date   WBC 13.2 (H)  04/26/2021   HGB 13.0 04/26/2021   HCT 39.0 04/26/2021   PLT 347 04/26/2021   GLUCOSE 148 (H) 04/26/2021   CHOL 278 (H) 06/07/2020   TRIG 176 (H) 06/07/2020   HDL 63 06/07/2020   LDLCALC 182 (H) 06/07/2020   ALT 34 04/26/2021   AST 20 04/26/2021   NA 138 04/26/2021   K 4.0 04/26/2021   CL 104 04/26/2021   CREATININE 0.61 04/26/2021   BUN 15 04/26/2021   CO2 23 04/26/2021    DG Knee 2 Views Left  Result Date: 04/17/2021 CLINICAL DATA:  Left knee pain. The patient reports multiple falls since yesterday. Initial encounter. EXAM: LEFT KNEE - 1-2 VIEW COMPARISON:  None. FINDINGS: There is no acute bony or joint abnormality. Total knee arthroplasty is in place. Hardware is intact without complicating feature. Soft tissues are negative. IMPRESSION: No acute abnormality.  Status post left knee replacement. Electronically Signed   By: Inge Rise M.D.   On: 04/17/2021 20:37   DG Ankle Complete Left  Result Date: 04/17/2021 CLINICAL DATA:  Left ankle pain. The patient reports multiple falls since yesterday. Initial encounter. EXAM: LEFT ANKLE COMPLETE - 3+ VIEW COMPARISON:  None. FINDINGS: There  is no evidence of fracture, dislocation, or joint effusion. There is no evidence of arthropathy or other focal bone abnormality. Small plantar calcaneal spur incidentally noted. Soft tissues are unremarkable. IMPRESSION: Negative exam. Electronically Signed   By: Inge Rise M.D.   On: 04/17/2021 20:34   CT HEAD WO CONTRAST (5MM)  Result Date: 04/24/2021 CLINICAL DATA:  Seizure. EXAM: CT HEAD WITHOUT CONTRAST TECHNIQUE: Contiguous axial images were obtained from the base of the skull through the vertex without intravenous contrast. COMPARISON:  None. FINDINGS: Brain: There is mild cerebral atrophy with widening of the extra-axial spaces and ventricular dilatation. There are areas of decreased attenuation within the white matter tracts of the supratentorial brain, consistent with microvascular  disease changes. A 2.2 cm x 1.9 cm x 2.2 cm well-defined low-attenuation white matter mass (approximately 22.91 Hounsfield units) is seen within the parasagittal posterior right frontal lobe, along the vertex (axial CT images 25 through 29, CT series 3). This contains a small hemorrhagic component which measures approximately 1.2 cm x 1.0 cm x 0.6 cm). A moderate to marked amount of surrounding white matter vasogenic edema is seen. Mass effect is seen on the adjacent sulci without evidence of midline shift. Vascular: No hyperdense vessel or unexpected calcification. Skull: Normal. Negative for fracture or focal lesion. Sinuses/Orbits: No acute finding. Other: None. IMPRESSION: 1. Hemorrhagic posterior right frontal lobe mass and associated vasogenic edema, as described above. MRI correlation is recommended. Electronically Signed   By: Virgina Norfolk M.D.   On: 04/24/2021 19:19   MR Brain W and Wo Contrast  Result Date: 04/25/2021 CLINICAL DATA:  Cancer of unknown primary, staging EXAM: MRI HEAD WITHOUT AND WITH CONTRAST TECHNIQUE: Multiplanar, multiecho pulse sequences of the brain and surrounding structures were obtained without and with intravenous contrast. CONTRAST:  60m GADAVIST GADOBUTROL 1 MMOL/ML IV SOLN COMPARISON:  None. FINDINGS: Brain: Hemorrhagic lesion of the paramedian superior right frontal lobe measures 2.6 x 1.6 cm. There is moderate surrounding vasogenic edema. There is a hematocrit level within the lesion. Normal white matter signal, parenchymal volume and CSF spaces. The midline structures are normal. Contrast enhancement of the above described lesion is predominantly peripheral. There are no other contrast-enhancing lesions. Vascular: Major flow voids are preserved. Skull and upper cervical spine: Normal calvarium and skull base. Visualized upper cervical spine and soft tissues are normal. Sinuses/Orbits:No paranasal sinus fluid levels or advanced mucosal thickening. No mastoid or middle  ear effusion. Normal orbits. IMPRESSION: Hemorrhagic lesion of the paramedian superior right frontal lobe with moderate surrounding vasogenic edema. This is most consistent with a solitary metastasis. Electronically Signed   By: KUlyses JarredM.D.   On: 04/25/2021 00:15   CT CHEST ABDOMEN PELVIS W CONTRAST  Result Date: 04/25/2021 CLINICAL DATA:  Cancer of unknown primary, staging, history of left breast cancer in 2015, intracranial mass EXAM: CT CHEST, ABDOMEN, AND PELVIS WITH CONTRAST TECHNIQUE: Multidetector CT imaging of the chest, abdomen and pelvis was performed following the standard protocol during bolus administration of intravenous contrast. CONTRAST:  1072mOMNIPAQUE IOHEXOL 300 MG/ML  SOLN COMPARISON:  None. FINDINGS: CT CHEST FINDINGS Cardiovascular: The heart and great vessels are unremarkable without pericardial effusion. Normal caliber of the thoracic aorta. Prominent atherosclerosis of the coronary vasculature. Mediastinum/Nodes: No pathologic mediastinal, hilar, or axillary adenopathy. Thyroid, trachea, and esophagus are unremarkable. Lungs/Pleura: There is a left upper lobe spiculated suprahilar mass measuring 3.5 x 2.0 by 4.3 cm, consistent with primary bronchogenic neoplasm. There is an indeterminate 8 mm right upper  lobe pulmonary nodule image 57/5, concerning for metastatic disease. No other acute airspace disease, effusion, or pneumothorax. Central airways are patent. Musculoskeletal: There are no acute or destructive bony lesions. Reconstructed images demonstrate no additional findings. CT ABDOMEN PELVIS FINDINGS Hepatobiliary: Gallbladder is surgically absent. No focal liver abnormalities. No biliary duct dilation. Pancreas: Unremarkable. No pancreatic ductal dilatation or surrounding inflammatory changes. Spleen: Normal in size without focal abnormality. Adrenals/Urinary Tract: Adrenal glands are unremarkable. Kidneys are normal, without renal calculi, focal lesion, or hydronephrosis.  Evaluation of the bladder is limited due to streak artifact from bilateral hip arthroplasties. No gross abnormality. Stomach/Bowel: No bowel obstruction or ileus. Scattered diverticulosis of the distal colon without diverticulitis. No bowel wall thickening or inflammatory change. Vascular/Lymphatic: Aortic atherosclerosis. No enlarged abdominal or pelvic lymph nodes. Reproductive: Coarse uterine calcifications likely represent degenerating fibroids. No adnexal masses. Other: No free fluid or free gas.  No abdominal wall hernia. Musculoskeletal: Unremarkable bilateral hip arthroplasties. No acute or destructive bony lesions. Reconstructed images demonstrate no additional findings. IMPRESSION: 1. Large spiculated left upper lobe suprahilar mass most consistent with primary bronchogenic malignancy. This may be amenable to endobronchial sampling given proximity to the left upper bronchus. 2. Indeterminate 8 mm right upper lobe pulmonary nodule. Metastatic disease cannot be excluded. Attention on follow-up recommended. 3. No acute intra-abdominal or intrapelvic process. No evidence of subdiaphragmatic metastases. 4. Sigmoid diverticulosis without diverticulitis. 5. Aortic Atherosclerosis (ICD10-I70.0). Coronary artery atherosclerosis. Electronically Signed   By: Randa Ngo M.D.   On: 04/25/2021 19:18   EEG adult  Result Date: 04/25/2021 Lora Havens, MD     04/25/2021 12:46 PM Patient Name: Tara Murphy MRN: [id number] Epilepsy Attending: Lora Havens Referring Physician/Provider: Dr Irene Pap Date: 04/25/2021 Duration: 22.33 mins Patient history:  75 year old female with new onset seizure with metastatic brain lesion. EEG to evaluate for seizure Level of alertness: Awake AEDs during EEG study: LEV, GBP Technical aspects: This EEG study was done with scalp electrodes positioned according to the 10-20 International system of electrode placement. Electrical activity was acquired at a sampling rate of 500Hz   and reviewed with a high frequency filter of 70Hz  and a low frequency filter of 1Hz . EEG data were recorded continuously and digitally stored. Description: The posterior dominant rhythm consists of 9 Hz activity of moderate voltage (25-35 uV) seen predominantly in posterior head regions, symmetric and reactive to eye opening and eye closing. Hyperventilation and photic stimulation were not performed.   IMPRESSION: This study is within normal limits. No seizures or epileptiform discharges were seen throughout the recording. Tara Murphy   DG Hip Unilat W or Wo Pelvis 2-3 Views Left  Result Date: 04/17/2021 CLINICAL DATA:  Left leg pain weakness. The patient reports multiple falls since yesterday. Initial encounter. EXAM: DG HIP (WITH OR WITHOUT PELVIS) 2-3V LEFT COMPARISON:  None. FINDINGS: No acute bony or joint abnormality is seen on the right or left. 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 Hardware is intact without complicating feature. Soft tissues are negative. IMPRESSION: No acute abnormality. Status post bilateral total hip replacement. Electronically Signed   By: Inge Rise M.D.   On: 04/17/2021 20:36    Pathology:  DG Knee 2 Views Left  Result Date: 04/17/2021 CLINICAL DATA:  Left knee pain. The patient reports multiple falls since yesterday. Initial encounter. EXAM: LEFT KNEE - 1-2 VIEW COMPARISON:  None. FINDINGS: There is no acute bony or joint abnormality. Total knee arthroplasty is in place. Hardware is intact without complicating feature.  Soft tissues are negative. IMPRESSION: No acute abnormality.  Status post left knee replacement. Electronically Signed   By: Inge Rise M.D.   On: 04/17/2021 20:37   DG Ankle Complete Left  Result Date: 04/17/2021 CLINICAL DATA:  Left ankle pain. The patient reports multiple falls since yesterday. Initial encounter. EXAM: LEFT ANKLE COMPLETE - 3+ VIEW COMPARISON:  None. FINDINGS: There is no evidence of fracture, dislocation,  or joint effusion. There is no evidence of arthropathy or other focal bone abnormality. Small plantar calcaneal spur incidentally noted. Soft tissues are unremarkable. IMPRESSION: Negative exam. Electronically Signed   By: Inge Rise M.D.   On: 04/17/2021 20:34   CT HEAD WO CONTRAST (5MM)  Result Date: 04/24/2021 CLINICAL DATA:  Seizure. EXAM: CT HEAD WITHOUT CONTRAST TECHNIQUE: Contiguous axial images were obtained from the base of the skull through the vertex without intravenous contrast. COMPARISON:  None. FINDINGS: Brain: There is mild cerebral atrophy with widening of the extra-axial spaces and ventricular dilatation. There are areas of decreased attenuation within the white matter tracts of the supratentorial brain, consistent with microvascular disease changes. A 2.2 cm x 1.9 cm x 2.2 cm well-defined low-attenuation white matter mass (approximately 22.91 Hounsfield units) is seen within the parasagittal posterior right frontal lobe, along the vertex (axial CT images 25 through 29, CT series 3). This contains a small hemorrhagic component which measures approximately 1.2 cm x 1.0 cm x 0.6 cm). A moderate to marked amount of surrounding white matter vasogenic edema is seen. Mass effect is seen on the adjacent sulci without evidence of midline shift. Vascular: No hyperdense vessel or unexpected calcification. Skull: Normal. Negative for fracture or focal lesion. Sinuses/Orbits: No acute finding. Other: None. IMPRESSION: 1. Hemorrhagic posterior right frontal lobe mass and associated vasogenic edema, as described above. MRI correlation is recommended. Electronically Signed   By: Virgina Norfolk M.D.   On: 04/24/2021 19:19   MR Brain W and Wo Contrast  Result Date: 04/25/2021 CLINICAL DATA:  Cancer of unknown primary, staging EXAM: MRI HEAD WITHOUT AND WITH CONTRAST TECHNIQUE: Multiplanar, multiecho pulse sequences of the brain and surrounding structures were obtained without and with intravenous  contrast. CONTRAST:  69m GADAVIST GADOBUTROL 1 MMOL/ML IV SOLN COMPARISON:  None. FINDINGS: Brain: Hemorrhagic lesion of the paramedian superior right frontal lobe measures 2.6 x 1.6 cm. There is moderate surrounding vasogenic edema. There is a hematocrit level within the lesion. Normal white matter signal, parenchymal volume and CSF spaces. The midline structures are normal. Contrast enhancement of the above described lesion is predominantly peripheral. There are no other contrast-enhancing lesions. Vascular: Major flow voids are preserved. Skull and upper cervical spine: Normal calvarium and skull base. Visualized upper cervical spine and soft tissues are normal. Sinuses/Orbits:No paranasal sinus fluid levels or advanced mucosal thickening. No mastoid or middle ear effusion. Normal orbits. IMPRESSION: Hemorrhagic lesion of the paramedian superior right frontal lobe with moderate surrounding vasogenic edema. This is most consistent with a solitary metastasis. Electronically Signed   By: KUlyses JarredM.D.   On: 04/25/2021 00:15   CT CHEST ABDOMEN PELVIS W CONTRAST  Result Date: 04/25/2021 CLINICAL DATA:  Cancer of unknown primary, staging, history of left breast cancer in 2015, intracranial mass EXAM: CT CHEST, ABDOMEN, AND PELVIS WITH CONTRAST TECHNIQUE: Multidetector CT imaging of the chest, abdomen and pelvis was performed following the standard protocol during bolus administration of intravenous contrast. CONTRAST:  1048mOMNIPAQUE IOHEXOL 300 MG/ML  SOLN COMPARISON:  None. FINDINGS: CT CHEST FINDINGS Cardiovascular: The  heart and great vessels are unremarkable without pericardial effusion. Normal caliber of the thoracic aorta. Prominent atherosclerosis of the coronary vasculature. Mediastinum/Nodes: No pathologic mediastinal, hilar, or axillary adenopathy. Thyroid, trachea, and esophagus are unremarkable. Lungs/Pleura: There is a left upper lobe spiculated suprahilar mass measuring 3.5 x 2.0 by 4.3 cm,  consistent with primary bronchogenic neoplasm. There is an indeterminate 8 mm right upper lobe pulmonary nodule image 57/5, concerning for metastatic disease. No other acute airspace disease, effusion, or pneumothorax. Central airways are patent. Musculoskeletal: There are no acute or destructive bony lesions. Reconstructed images demonstrate no additional findings. CT ABDOMEN PELVIS FINDINGS Hepatobiliary: Gallbladder is surgically absent. No focal liver abnormalities. No biliary duct dilation. Pancreas: Unremarkable. No pancreatic ductal dilatation or surrounding inflammatory changes. Spleen: Normal in size without focal abnormality. Adrenals/Urinary Tract: Adrenal glands are unremarkable. Kidneys are normal, without renal calculi, focal lesion, or hydronephrosis. Evaluation of the bladder is limited due to streak artifact from bilateral hip arthroplasties. No gross abnormality. Stomach/Bowel: No bowel obstruction or ileus. Scattered diverticulosis of the distal colon without diverticulitis. No bowel wall thickening or inflammatory change. Vascular/Lymphatic: Aortic atherosclerosis. No enlarged abdominal or pelvic lymph nodes. Reproductive: Coarse uterine calcifications likely represent degenerating fibroids. No adnexal masses. Other: No free fluid or free gas.  No abdominal wall hernia. Musculoskeletal: Unremarkable bilateral hip arthroplasties. No acute or destructive bony lesions. Reconstructed images demonstrate no additional findings. IMPRESSION: 1. Large spiculated left upper lobe suprahilar mass most consistent with primary bronchogenic malignancy. This may be amenable to endobronchial sampling given proximity to the left upper bronchus. 2. Indeterminate 8 mm right upper lobe pulmonary nodule. Metastatic disease cannot be excluded. Attention on follow-up recommended. 3. No acute intra-abdominal or intrapelvic process. No evidence of subdiaphragmatic metastases. 4. Sigmoid diverticulosis without  diverticulitis. 5. Aortic Atherosclerosis (ICD10-I70.0). Coronary artery atherosclerosis. Electronically Signed   By: Randa Ngo M.D.   On: 04/25/2021 19:18   EEG adult  Result Date: 04/25/2021 Lora Havens, MD     04/25/2021 12:46 PM Patient Name: Tara Murphy MRN: [id number] Epilepsy Attending: Lora Havens Referring Physician/Provider: Dr Irene Pap Date: 04/25/2021 Duration: 22.33 mins Patient history:  75 year old female with new onset seizure with metastatic brain lesion. EEG to evaluate for seizure Level of alertness: Awake AEDs during EEG study: LEV, GBP Technical aspects: This EEG study was done with scalp electrodes positioned according to the 10-20 International system of electrode placement. Electrical activity was acquired at a sampling rate of 500Hz  and reviewed with a high frequency filter of 70Hz  and a low frequency filter of 1Hz . EEG data were recorded continuously and digitally stored. Description: The posterior dominant rhythm consists of 9 Hz activity of moderate voltage (25-35 uV) seen predominantly in posterior head regions, symmetric and reactive to eye opening and eye closing. Hyperventilation and photic stimulation were not performed.   IMPRESSION: This study is within normal limits. No seizures or epileptiform discharges were seen throughout the recording. Tara Murphy   DG Hip Unilat W or Wo Pelvis 2-3 Views Left  Result Date: 04/17/2021 CLINICAL DATA:  Left leg pain weakness. The patient reports multiple falls since yesterday. Initial encounter. EXAM: DG HIP (WITH OR WITHOUT PELVIS) 2-3V LEFT COMPARISON:  None. FINDINGS: No acute bony or joint abnormality is seen on the right or left. 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 Hardware is intact without complicating feature. Soft tissues are negative. IMPRESSION: No acute abnormality. Status post bilateral total hip replacement. Electronically Signed   By: Inge Rise  M.D.   On: 04/17/2021 20:36     I  spent 70 minutes in the care of this patient including history, review of records, counseling and coordination of care as mentioned in the assessment and plan.  I coordinated care with family, her primary care doctor as well as her medical oncologist.

## 2021-04-26 NOTE — Progress Notes (Signed)
Left LE simple partial Sz's imroved with med change.  Chest Ct worrisome for Lung CA primary.    Plan for crani and resctionl ;of tumor on [place] am.   Cont current care.

## 2021-04-26 NOTE — Progress Notes (Addendum)
Subjective: States that she is continue to have episodes of left flank spasm/twitching.  They have improved since admission but still happening intermittently.  Continues to have weakness on the left side, leg more than arm.   ROS: negative except above Examination  Vital signs in last 24 hours: Temp:  [97.7 F (36.5 C)-99.2 F (37.3 C)] 97.8 F (36.6 C) (08/04 1149) Pulse Rate:  [66-82] 66 (08/04 1149) Resp:  [17-19] 19 (08/04 1149) BP: (110-139)/(55-76) 123/61 (08/04 1149) SpO2:  [92 %-98 %] 98 % (08/04 1149)  General: lying in bed, NAD CVS: pulse-normal rate and rhythm RS: breathing comfortably, symmetric expansion bilaterally Extremities: normal, warm  Neuro: MS: Alert, oriented, follows commands CN: pupils equal and reactive,  EOMI, face symmetric, tongue midline, normal sensation over face, Motor: 5/5 strength in right upper and lower extremity, 4/5 in left upper extremity, 3/left lower extremity  Basic Metabolic Panel: Recent Labs  Lab 04/24/21 1758 04/25/21 0401 04/26/21 0442  NA 139 139 138  K 3.9 3.9 4.0  CL 107 107 104  CO2 22 22 23   GLUCOSE 103* 165* 148*  BUN 15 11 15   CREATININE 0.74 0.50 0.61  CALCIUM 9.4 9.1 9.8  MG  --  1.9  --   PHOS  --  4.2  --     CBC: Recent Labs  Lab 04/24/21 1758 04/25/21 0401 04/26/21 0442  WBC 9.8 6.3 13.2*  NEUTROABS 7.9*  --   --   HGB 14.1 14.0 13.0  HCT 44.7 43.0 39.0  MCV 94.9 93.9 91.5  PLT 364 319 347     Coagulation Studies: No results for input(s): LABPROT, INR in the last 72 hours.  Imaging  MRI brain with and without contrast 04/24/2021: Hemorrhagic lesion of the paramedian superior right frontal lobe with moderate surrounding vasogenic edema. This is most consistent with a solitary metastasis.      CT chest Abdo pelvis with contrast 04/25/2021:  Large spiculated left upper lobe suprahilar mass most consistent with primary bronchogenic malignancy. This may be amenable to endobronchial sampling  given proximity to the left upper bronchus. 2. Indeterminate 8 mm right upper lobe pulmonary nodule. Metastatic disease cannot be excluded. Attention on follow-up recommended. 3. No acute intra-abdominal or intrapelvic process. No evidence of subdiaphragmatic metastases. 4. Sigmoid diverticulosis without diverticulitis. 5. Aortic Atherosclerosis (ICD10-I70.0). Coronary artery atherosclerosis.   ASSESSMENT AND PLAN: 75 year old right-handed female with history of left breast cancer status postlumpectomy, former smoker who presented on 04/24/2021 with left upper and lower extremity jerking.  MRI brain with and without contrast showed hemorrhagic lesion in the paramedian superior right frontal lobe with moderate surrounding vasogenic edema consistent with solitary met.  CT chest showed large spiculated left upper lobe suprahilar mass consistent most likely with primary bronchogenic malignancy.  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 -We will started on Keppra after with seizure frequency improved but focal motor seizures still happening intermittently  Recommendations - Will load with Dilantin 1500 mg once -We will also increase Keppra to 1000 mg twice daily -Also on gabapentin 600 mg in the morning and afternoon and 800 mg at bedtime -Long-term EEG to assess for seizures -Depending on seizure frequency and duration, will consider loading with another AED versus increasing maintenance dose of Keppra -Continue seizure precautions -As needed IV Ativan 2 mg for clinical seizure-like activity lasting more than 5 minutes -Management of rest of comorbidities per primary team  I have spent a  total of 35 minutes with the patient reviewing hospital notes,  test results, labs and examining the patient as well as establishing an assessment and plan that was discussed personally with the patien and her 2 daughters at bedside.  >  50% of time was spent in direct patient care.   Zeb Comfort Epilepsy [facility] For questions after 5pm please refer to AMION to reach the Neurologist on call

## 2021-04-26 NOTE — Progress Notes (Signed)
LTM EEG hooked up and running - no initial skin breakdown - push button tested - neuro notified. [facility] monitoring.  

## 2021-04-26 NOTE — Progress Notes (Signed)
PROGRESS NOTE    Debroah Shuttleworth  PPJ:[id number] DOB: 1946-06-22 DOA: 04/24/2021 PCP: Leamon Arnt, MD    Chief Complaint  Patient presents with   Seizures   Extremity Weakness    Brief Narrative:   Tara Murphy is a 75 y.o. female with medical history significant for left breast cancer status postlumpectomy in 2015, left foot drop, EMG diagnosis of peripheral neuropathy, hyperlipidemia, GERD, chronic anxiety/depression, herpes zoster, former smoker, restless leg syndrome, IBS, who presented to [facility] ED from home after having a seizure like activity, likely focal with left lower and left upper extremity uncoordinated uncontrolled jerky movements, witnessed by her husband who is at bedside, accompanied by their daughter.   CT head shows a brain tumor 2 x 2 cm hemorrhagic posterior right frontal lobe mass and associated vasogenic edema.  She was started on Keppra, CT chest/abdomen/pelvis significant for finding of left upper lung spiculated mass suspicious for primary malignancy.  Assessment & Plan:   Active Problems:   GERD (gastroesophageal reflux disease)   Seizure (HCC)   Brain mass   New seizure activity in the setting of newly diagnosed brain metastasis. -Neurosurgery/neurology input greatly appreciated. -Seizure medications per neurology, currently on Keppra, being loaded with Dilantin, she is on long-term EEG. -NeuroSurgery input greatly appreciated, for Ig surgery versus radiosurgery next week. -Continue with IV steroids  Lung cancer with brain metastasis  -New finding, oncology consulted, at this point no need for biopsy as per GI can be obtained from tomorrow after surgery.     Elevated BP -IV of IV labetalol as needed   Chronic anxiety/depression On Buspar prior to admission -He was started on as needed Xanax.   Ambulatory dysfunction T/OT consulted   DVT prophylaxis: scd Code Status: Full Family Communication: Daughters at bedSide Disposition:   Status  is: Inpatient  Remains inpatient appropriate because:IV treatments appropriate due to intensity of illness or inability to take PO  Dispo: The patient is from: Home              Anticipated d/c is to: Home              Patient currently is not medically stable to d/c.   Difficult to place patient No       Consultants:  neurology   Subjective:  He had significant twitching in her leg yesterday, but this has significantly improved after receiving Ativan and antiseizure medications.    Objective: Vitals:   04/25/21 2322 04/26/21 0405 04/26/21 0742 04/26/21 1149  BP: (!) 110/55 123/60 119/60 123/61  Pulse: 72 73 71 66  Resp: _0 Temp: 98.5 F (36.9 C) 97.7 F (36.5 C) 98.2 F (36.8 C) 97.8 F (36.6 C)  TempSrc: Oral Oral Oral Oral  SpO2: 92% 93% 95% 98%  Weight:      Height:        Intake/Output Summary (Last 24 hours) at 04/26/2021 1347 Last data filed at 04/26/2021 0300 Gross per 24 hour  Intake 490 ml  Output 500 ml  Net -10 ml   Filed Weights   04/24/21 1807  Weight: 94.3 kg    Examination:  Awake Alert, Oriented X 3, Normal affect Symmetrical Chest wall movement, Good air movement bilaterally, CTAB RRR,No Gallops,Rubs or new Murmurs, No Parasternal Heave +ve B.Sounds, Abd Soft, No tenderness, No rebound - guarding or rigidity. No Cyanosis, Clubbing or edema, No new Rash or bruise       Data Reviewed: I have personally reviewed following  labs and imaging studies  CBC: Recent Labs  Lab 04/24/21 1758 04/25/21 0401 04/26/21 0442  WBC 9.8 6.3 13.2*  NEUTROABS 7.9*  --   --   HGB 14.1 14.0 13.0  HCT 44.7 43.0 39.0  MCV 94.9 93.9 91.5  PLT 364 319 347    Basic Metabolic Panel: Recent Labs  Lab 04/24/21 1758 04/25/21 0401 04/26/21 0442  NA 139 139 138  K 3.9 3.9 4.0  CL 107 107 104  CO2 22 22 23  GLUCOSE 103* 165* 148*  BUN 15 11 15  CREATININE 0.74 0.50 0.61  CALCIUM 9.4 9.1 9.8  MG  --  1.9  --   PHOS  --  4.2  --      GFR: Estimated Creatinine Clearance: 71.4 mL/min (by C-G formula based on SCr of 0.61 mg/dL).  Liver Function Tests: Recent Labs  Lab 04/24/21 1758 04/26/21 0442  AST 37 20  ALT 43 34  ALKPHOS 85 78  BILITOT 0.6 0.5  PROT 7.1 6.1*  ALBUMIN 4.0 3.4*    CBG: Recent Labs  Lab 04/25/21 1221 04/25/21 1716 04/25/21 2057 04/26/21 0614 04/26/21 1152  GLUCAP 135* 124* 192* 156* 131*     Recent Results (from the past 240 hour(s))  Urine Culture     Status: None   Collection Time: 04/16/21  3:07 PM   Specimen: Urine  Result Value Ref Range Status   MICRO NUMBER: [id number]  Final   SPECIMEN QUALITY: Adequate  Final   Sample Source NOT GIVEN  Final   STATUS: FINAL  Final   ISOLATE 1:   Final    Mixed genital flora isolated. These superficial bacteria are not indicative of a urinary tract infection. No further organism identification is warranted on this specimen. If clinically indicated, recollect clean-catch, mid-stream urine and transfer  immediately to Urine Culture Transport Tube.   SARS CORONAVIRUS 2 (TAT 6-24 HRS) Nasopharyngeal Nasopharyngeal Swab     Status: None   Collection Time: 04/25/21  3:36 PM   Specimen: Nasopharyngeal Swab  Result Value Ref Range Status   SARS Coronavirus 2 NEGATIVE NEGATIVE Final    Comment: (NOTE) SARS-CoV-2 target nucleic acids are NOT DETECTED.  The SARS-CoV-2 RNA is generally detectable in upper and lower respiratory specimens during the acute phase of infection. Negative results do not preclude SARS-CoV-2 infection, do not rule out co-infections with other pathogens, and should not be used as the sole basis for treatment or other patient management decisions. Negative results must be combined with clinical observations, patient history, and epidemiological information. The expected result is Negative.  Fact Sheet for Patients: https://www.fda.gov/media/138098/download  Fact Sheet for Healthcare  Providers: https://www.fda.gov/media/138095/download  This test is not yet approved or cleared by the [place] FDA and  has been authorized for detection and/or diagnosis of SARS-CoV-2 by FDA under an Emergency Use Authorization (EUA). This EUA will remain  in effect (meaning this test can be used) for the duration of the COVID-19 declaration under Se ction 564(b)(1) of the Act, 21 U.S.C. section 360bbb-3(b)(1), unless the authorization is terminated or revoked sooner.  Performed at [facility], [address] , [place] [address]          Radiology Studies: CT HEAD WO CONTRAST (5MM)  Result Date: 04/24/2021 CLINICAL DATA:  Seizure. EXAM: CT HEAD WITHOUT CONTRAST TECHNIQUE: Contiguous axial images were obtained from the base of the skull through the vertex without intravenous contrast. COMPARISON:  None. FINDINGS: Brain: There is mild cerebral   atrophy with widening of the extra-axial spaces and ventricular dilatation. There are areas of decreased attenuation within the white matter tracts of the supratentorial brain, consistent with microvascular disease changes. A 2.2 cm x 1.9 cm x 2.2 cm well-defined low-attenuation white matter mass (approximately 22.91 Hounsfield units) is seen within the parasagittal posterior right frontal lobe, along the vertex (axial CT images 25 through 29, CT series 3). This contains a small hemorrhagic component which measures approximately 1.2 cm x 1.0 cm x 0.6 cm). A moderate to marked amount of surrounding white matter vasogenic edema is seen. Mass effect is seen on the adjacent sulci without evidence of midline shift. Vascular: No hyperdense vessel or unexpected calcification. Skull: Normal. Negative for fracture or focal lesion. Sinuses/Orbits: No acute finding. Other: None. IMPRESSION: 1. Hemorrhagic posterior right frontal lobe mass and associated vasogenic edema, as described above. MRI correlation is recommended. Electronically Signed    By: Virgina Norfolk M.D.   On: 04/24/2021 19:19   MR Brain W and Wo Contrast  Result Date: 04/25/2021 CLINICAL DATA:  Cancer of unknown primary, staging EXAM: MRI HEAD WITHOUT AND WITH CONTRAST TECHNIQUE: Multiplanar, multiecho pulse sequences of the brain and surrounding structures were obtained without and with intravenous contrast. CONTRAST:  88m GADAVIST GADOBUTROL 1 MMOL/ML IV SOLN COMPARISON:  None. FINDINGS: Brain: Hemorrhagic lesion of the paramedian superior right frontal lobe measures 2.6 x 1.6 cm. There is moderate surrounding vasogenic edema. There is a hematocrit level within the lesion. Normal white matter signal, parenchymal volume and CSF spaces. The midline structures are normal. Contrast enhancement of the above described lesion is predominantly peripheral. There are no other contrast-enhancing lesions. Vascular: Major flow voids are preserved. Skull and upper cervical spine: Normal calvarium and skull base. Visualized upper cervical spine and soft tissues are normal. Sinuses/Orbits:No paranasal sinus fluid levels or advanced mucosal thickening. No mastoid or middle ear effusion. Normal orbits. IMPRESSION: Hemorrhagic lesion of the paramedian superior right frontal lobe with moderate surrounding vasogenic edema. This is most consistent with a solitary metastasis. Electronically Signed   By: KUlyses JarredM.D.   On: 04/25/2021 00:15   CT CHEST ABDOMEN PELVIS W CONTRAST  Result Date: 04/25/2021 CLINICAL DATA:  Cancer of unknown primary, staging, history of left breast cancer in 2015, intracranial mass EXAM: CT CHEST, ABDOMEN, AND PELVIS WITH CONTRAST TECHNIQUE: Multidetector CT imaging of the chest, abdomen and pelvis was performed following the standard protocol during bolus administration of intravenous contrast. CONTRAST:  1024mOMNIPAQUE IOHEXOL 300 MG/ML  SOLN COMPARISON:  None. FINDINGS: CT CHEST FINDINGS Cardiovascular: The heart and great vessels are unremarkable without pericardial  effusion. Normal caliber of the thoracic aorta. Prominent atherosclerosis of the coronary vasculature. Mediastinum/Nodes: No pathologic mediastinal, hilar, or axillary adenopathy. Thyroid, trachea, and esophagus are unremarkable. Lungs/Pleura: There is a left upper lobe spiculated suprahilar mass measuring 3.5 x 2.0 by 4.3 cm, consistent with primary bronchogenic neoplasm. There is an indeterminate 8 mm right upper lobe pulmonary nodule image 57/5, concerning for metastatic disease. No other acute airspace disease, effusion, or pneumothorax. Central airways are patent. Musculoskeletal: There are no acute or destructive bony lesions. Reconstructed images demonstrate no additional findings. CT ABDOMEN PELVIS FINDINGS Hepatobiliary: Gallbladder is surgically absent. No focal liver abnormalities. No biliary duct dilation. Pancreas: Unremarkable. No pancreatic ductal dilatation or surrounding inflammatory changes. Spleen: Normal in size without focal abnormality. Adrenals/Urinary Tract: Adrenal glands are unremarkable. Kidneys are normal, without renal calculi, focal lesion, or hydronephrosis. Evaluation of the bladder is limited due to  streak artifact from bilateral hip arthroplasties. No gross abnormality. Stomach/Bowel: No bowel obstruction or ileus. Scattered diverticulosis of the distal colon without diverticulitis. No bowel wall thickening or inflammatory change. Vascular/Lymphatic: Aortic atherosclerosis. No enlarged abdominal or pelvic lymph nodes. Reproductive: Coarse uterine calcifications likely represent degenerating fibroids. No adnexal masses. Other: No free fluid or free gas.  No abdominal wall hernia. Musculoskeletal: Unremarkable bilateral hip arthroplasties. No acute or destructive bony lesions. Reconstructed images demonstrate no additional findings. IMPRESSION: 1. Large spiculated left upper lobe suprahilar mass most consistent with primary bronchogenic malignancy. This may be amenable to  endobronchial sampling given proximity to the left upper bronchus. 2. Indeterminate 8 mm right upper lobe pulmonary nodule. Metastatic disease cannot be excluded. Attention on follow-up recommended. 3. No acute intra-abdominal or intrapelvic process. No evidence of subdiaphragmatic metastases. 4. Sigmoid diverticulosis without diverticulitis. 5. Aortic Atherosclerosis (ICD10-I70.0). Coronary artery atherosclerosis. Electronically Signed   By: Randa Ngo M.D.   On: 04/25/2021 19:18   EEG adult  Result Date: 04/25/2021 Lora Havens, MD     04/25/2021 12:46 PM Patient Name: Congetta Odriscoll MRN: [id number] Epilepsy Attending: Lora Havens Referring Physician/Provider: Dr Irene Pap Date: 04/25/2021 Duration: 22.33 mins Patient history:  75 year old female with new onset seizure with metastatic brain lesion. EEG to evaluate for seizure Level of alertness: Awake AEDs during EEG study: LEV, GBP Technical aspects: This EEG study was done with scalp electrodes positioned according to the 10-20 International system of electrode placement. Electrical activity was acquired at a sampling rate of 500Hz and reviewed with a high frequency filter of 70Hz and a low frequency filter of 1Hz. EEG data were recorded continuously and digitally stored. Description: The posterior dominant rhythm consists of 9 Hz activity of moderate voltage (25-35 uV) seen predominantly in posterior head regions, symmetric and reactive to eye opening and eye closing. Hyperventilation and photic stimulation were not performed.   IMPRESSION: This study is within normal limits. No seizures or epileptiform discharges were seen throughout the recording. Priyanka Barbra Sarks        Scheduled Meds:  chlorhexidine gluconate (MEDLINE KIT)  15 mL Mouth Rinse BID   dexamethasone (DECADRON) injection  4 mg Intravenous Q6H   diphenhydrAMINE  50 mg Oral Once   Or   diphenhydrAMINE  50 mg Intravenous Once   gabapentin  600 mg Oral BID   gabapentin  800  mg Oral Once   insulin aspart  0-5 Units Subcutaneous QHS   insulin aspart  0-9 Units Subcutaneous TID WC   mouth rinse  15 mL Mouth Rinse 10 times per day   pantoprazole (PROTONIX) IV  40 mg Intravenous Q24H   rOPINIRole  4 mg Oral QHS   Continuous Infusions:  levETIRAcetam     phenytoin (DILANTIN) IV 1,500 mg (04/26/21 1329)     LOS: 2 days      Phillips Climes, MD [facility]   To contact the attending provider between 7A-7P or the covering provider during after hours 7P-7A, please log into the web site [email or web address] and access using universal [place] password for that web site. If you do not have the password, please call the hospital operator.  04/26/2021, 1:47 PM

## 2021-04-26 NOTE — Evaluation (Signed)
Physical Therapy Evaluation Patient Details Name: Tara Murphy MRN: [id number] DOB: 03-Sep-1946 Today's Date: 04/26/2021   History of Present Illness  75 y.o. female presents to [facility] ED on 04/24/2021 after seizure-like activity (L sided jerking movements). 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 PMH includes left breast cancer status postlumpectomy in 2015, left foot drop, EMG diagnosis of peripheral neuropathy, hyperlipidemia, GERD, chronic anxiety/depression, herpes zoster, former smoker, restless leg syndrome, IBS.  Clinical Impression  Pt presents to PT with deficits in strength, power, functional mobility, balance, gait, sensation. Pt demonstrates L sided weakness and sensation deficits resulting in L foot drop during mobility. Pt benefits from assistance with bed mobility and transfers to improve safety. Pt has a history of frequent falls since symptom onset in February and will benefit from aggressive mobilization and PT POC to reduce falls risk and restore independence. PT recommends CIR placement pending pt functional status after pending surgery.    Follow Up Recommendations CIR (pending re-eval after surgery monday)    Equipment Recommendations   (TBD)    Recommendations for Other Services Rehab consult     Precautions / Restrictions Precautions Precautions: Fall Precaution Comments: seizure Required Braces or Orthoses: Other Brace (pt owns AFO, not present, family to bring) Restrictions Weight Bearing Restrictions: No      Mobility  Bed Mobility Overal bed mobility: Needs Assistance Bed Mobility: Supine to Sit     Supine to sit: Min assist     General bed mobility comments: use of railings and PT UE support to pull up    Transfers Overall transfer level: Needs assistance Equipment used: Rolling walker (2 wheeled);4-wheeled walker Transfers: Sit to/from Stand Sit to Stand: Min assist;Min guard             Ambulation/Gait Ambulation/Gait assistance: Counsellor (Feet): 20 Feet Assistive device: 4-wheeled walker Gait Pattern/deviations: Step-to pattern;Decreased dorsiflexion - left Gait velocity: reduced Gait velocity interpretation: <1.8 ft/sec, indicate of risk for recurrent falls General Gait Details: pt with slowed step-to gait, mild L foot drag. Pt demonstrates good awareness of deficit  Stairs            Wheelchair Mobility    Modified Rankin (Stroke Patients Only)       Balance Overall balance assessment: Needs assistance Sitting-balance support: Feet supported;No upper extremity supported Sitting balance-Leahy Scale: Good     Standing balance support: Single extremity supported;Bilateral upper extremity supported Standing balance-Leahy Scale: Poor Standing balance comment: reliant on UE support of assistive device                             Pertinent Vitals/Pain Pain Assessment: 0-10 Pain Score: 2  Pain Location: head Pain Descriptors / Indicators: Headache Pain Intervention(s): Monitored during session    Home Living Family/patient expects to be discharged to:: Private residence Living Arrangements: Spouse/significant other Available Help at Discharge: Family;Available 24 hours/day (limited physical assist currently) Type of Home: House Home Access: Stairs to enter Entrance Stairs-Rails: None Entrance Stairs-Number of Steps: 1 Home Layout: One level Home Equipment: Walker - 4 wheels;Grab bars - toilet;Shower seat - built Investment banker, operational      Prior Function Level of Independence: Needs assistance   Gait / Transfers Assistance Needed: pt ambulates household distances with use of rollator. Pt requires PRN assist for bed mobility and transfers. Recent history of multiple falls. Symptoms began in February, prior  to pt was independent  ADL's / Homemaking Assistance Needed: spouse assists with bathing, LB dressing,  IADLs        Hand Dominance        Extremity/Trunk Assessment   Upper Extremity Assessment Upper Extremity Assessment: Defer to OT evaluation    Lower Extremity Assessment Lower Extremity Assessment: LLE deficits/detail LLE Deficits / Details: 2-/5 PF/DF, 2+/5 knee extension LLE Sensation: decreased light touch    Cervical / Trunk Assessment Cervical / Trunk Assessment: Normal  Communication   Communication: No difficulties  Cognition Arousal/Alertness: Awake/alert Behavior During Therapy: WFL for tasks assessed/performed Overall Cognitive Status: Within Functional Limits for tasks assessed                                        General Comments General comments (skin integrity, edema, etc.): VSS on RA    Exercises     Assessment/Plan    PT Assessment Patient needs continued PT services  PT Problem List Decreased strength;Decreased activity tolerance;Decreased balance;Decreased mobility;Impaired sensation       PT Treatment Interventions DME instruction;Gait training;Stair training;Functional mobility training;Therapeutic activities;Therapeutic exercise;Balance training;Neuromuscular re-education;Patient/family education    PT Goals (Current goals can be found in the Care Plan section)  Acute Rehab PT Goals Patient Stated Goal: to return to independent mobility PT Goal Formulation: With patient Time For Goal Achievement: 05/10/21 Potential to Achieve Goals: Fair    Frequency Min 3X/week   Barriers to discharge        Co-evaluation               AM-PAC PT "6 Clicks" Mobility  Outcome Measure Help needed turning from your back to your side while in a flat bed without using bedrails?: A Little Help needed moving from lying on your back to sitting on the side of a flat bed without using bedrails?: A Little Help needed moving to and from a bed to a chair (including a wheelchair)?: A Little Help needed standing up from a chair using  your arms (e.g., wheelchair or bedside chair)?: A Little Help needed to walk in hospital room?: A Little Help needed climbing 3-5 steps with a railing? : A Lot 6 Click Score: 17    End of Session   Activity Tolerance: Patient tolerated treatment well Patient left: in chair;with call bell/phone within reach;with chair alarm set;with family/visitor present Nurse Communication: Mobility status PT Visit Diagnosis: Unsteadiness on feet (R26.81);Other abnormalities of gait and mobility (R26.89);Muscle weakness (generalized) (M62.81);Other symptoms and signs involving the nervous system (R29.898)    Time: 6720-9470 PT Time Calculation (min) (ACUTE ONLY): 52 min   Charges:   PT Evaluation $PT Eval Low Complexity: 1 Low PT Treatments $Therapeutic Activity: 8-22 mins        Zenaida Niece, PT, DPT Acute Rehabilitation Pager: [phone]   Zenaida Niece 04/26/2021, 10:03 AM

## 2021-04-26 NOTE — Evaluation (Addendum)
Occupational Therapy Evaluation Patient Details Name: Tara Murphy MRN: [id number] DOB: [birth year] Today's Date: 04/26/2021    History of Present Illness 75 y.o. female presents to [facility] ED on 04/24/2021 after seizure-like activity (L sided jerking movements). 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 PMH includes left breast cancer status postlumpectomy in 2015, left foot drop, EMG diagnosis of peripheral neuropathy, hyperlipidemia, GERD, chronic anxiety/depression, herpes zoster, former smoker, restless leg syndrome, IBS.   Clinical Impression   Patient admitted for the diagnosis above.  She has surgery schedule 8/8 for debulking of frontal lobe mass.  PTA she endorses declining ADL and mobility status.  Her spouse was assisting with lower body ADL, meal prep, med management, bill payment, home management, and community mobility.  Recently she has been using a 4WRW and AFO for mobility at home.  Barriers are listed below.  Currently she is needing up to Mod A for basic bed mobility, and up to Mod A for lower body ADL at a seated level.  OT to follow acutely, and adjust discharge recommendations as needed post surgery.      Follow Up Recommendations  Home health OT;Supervision/Assistance - 24 hour    Equipment Recommendations  None recommended by OT    Recommendations for Other Services       Precautions / Restrictions Precautions Precautions: Fall Precaution Comments: seizure Restrictions Weight Bearing Restrictions: No Other Position/Activity Restrictions: foot drop      Mobility Bed Mobility Overal bed mobility: Needs Assistance Bed Mobility: Supine to Sit;Sit to Supine     Supine to sit: Mod assist Sit to supine: Mod assist   General bed mobility comments: cues and assist to elevate trunk and bring L leg on/off the bed Patient Response: Cooperative  Transfers Overall transfer level: Needs assistance                General transfer comment: not attempted due to lethargy and description of feeling "high"    Balance Overall balance assessment: Needs assistance Sitting-balance support: Feet supported;No upper extremity supported Sitting balance-Leahy Scale: Good     Standing balance support: Single extremity supported;Bilateral upper extremity supported Standing balance-Leahy Scale: Poor Standing balance comment: reliant on 4wrw                           ADL either performed or assessed with clinical judgement   ADL Overall ADL's : Needs assistance/impaired     Grooming: Wash/dry hands;Wash/dry face;Set up;Bed level           Upper Body Dressing : Minimal assistance;Bed level   Lower Body Dressing: Maximal assistance;Sitting/lateral leans                       Vision Patient Visual Report: No change from baseline       Perception     Praxis      Pertinent Vitals/Pain Pain Assessment: Faces Faces Pain Scale: Hurts a little bit Pain Location: off and on headache Pain Descriptors / Indicators: Headache;Aching Pain Intervention(s): Monitored during session     Hand Dominance Right   Extremity/Trunk Assessment Upper Extremity Assessment Upper Extremity Assessment: Generalized weakness;LUE deficits/detail LUE Deficits / Details: grossly 3/5 to 3+/5 MMT.  Weak grip and difficulty with finger to nose LUE Sensation: WNL LUE Coordination: decreased fine motor;decreased gross motor   Lower Extremity Assessment Lower Extremity Assessment: Defer to PT evaluation  Cervical / Trunk Assessment Cervical / Trunk Assessment: Normal   Communication Communication Communication: No difficulties   Cognition Arousal/Alertness: Lethargic;Suspect due to medications Behavior During Therapy: [facility] for tasks assessed/performed Overall Cognitive Status: Within Functional Limits for tasks assessed                                 General  Comments: patient stating she feel "high"                    Home Living Family/patient expects to be discharged to:: Private residence Living Arrangements: Spouse/significant other Available Help at Discharge: Family;Available 24 hours/day Type of Home: House Home Access: Stairs to enter   Entrance Stairs-Rails: None Home Layout: One level     Bathroom Shower/Tub: Occupational psychologist: Standard     Home Equipment: Environmental consultant - 4 wheels;Grab bars - toilet;Shower seat - built Investment banker, operational          Prior Functioning/Environment Level of Independence: Needs assistance  Gait / Transfers Assistance Needed: Per Chart: pt ambulates household distances with use of rollator. Pt requires PRN assist for bed mobility and transfers. Recent history of multiple falls. Symptoms began in February, prior to pt was independent ADL's / Homemaking Assistance Needed: Per Chart: spouse assists with bathing, LB dressing, IADLs            OT Problem List: Decreased strength;Decreased activity tolerance;Impaired balance (sitting and/or standing)      OT Treatment/Interventions: Self-care/ADL training;Therapeutic exercise;DME and/or AE instruction;Balance training;Therapeutic activities    OT Goals(Current goals can be found in the care plan section) Acute Rehab OT Goals Patient Stated Goal: To be more independent OT Goal Formulation: With patient Time For Goal Achievement: 05/10/21 Potential to Achieve Goals: Good ADL Goals Pt Will Perform Grooming: with supervision;sitting;standing Pt Will Perform Lower Body Bathing: with supervision;sit to/from stand Pt Will Perform Lower Body Dressing: with supervision;sit to/from stand Pt Will Transfer to Toilet: with supervision;ambulating;regular height toilet Pt Will Perform Toileting - Clothing Manipulation and hygiene: with supervision;sit to/from stand  OT Frequency: Min 2X/week   Barriers to D/C:    none noted        Co-evaluation              AM-PAC OT "6 Clicks" Daily Activity     Outcome Measure Help from another person eating meals?: None Help from another person taking care of personal grooming?: None Help from another person toileting, which includes using toliet, bedpan, or urinal?: A Little Help from another person bathing (including washing, rinsing, drying)?: A Little Help from another person to put on and taking off regular upper body clothing?: None Help from another person to put on and taking off regular lower body clothing?: A Little 6 Click Score: 21   End of Session Equipment Utilized During Treatment: Gait belt;Rolling walker  Activity Tolerance: Patient tolerated treatment well Patient left: in bed;with call bell/phone within reach;with bed alarm set  OT Visit Diagnosis: Unsteadiness on feet (R26.81);Other abnormalities of gait and mobility (R26.89);Muscle weakness (generalized) (M62.81);Pain Pain - Right/Left:  (headache)                Time: 1517-6160 OT Time Calculation (min): 19 min Charges:  OT General Charges $OT Visit: 1 Visit OT Evaluation $OT Eval Moderate Complexity: 1 Mod  04/26/2021  Rich, OTR/L  Acute Rehabilitation Services  Office:  [place]  04/26/2021, 4:07 PM

## 2021-04-26 NOTE — Progress Notes (Addendum)
Rehab Admissions Coordinator Note:  Patient was screened by Cleatrice Burke for appropriateness for an Inpatient Acute Rehab Consult per therapy recs.   At this time, we are recommending Inpatient Rehab consult. I will place order per protocol.  Cleatrice Burke RN MSN 04/26/2021, 10:50 AM  I can be reached at [phone].  I will hold complete assessment  of CIR needs until Neurosurgery plan of care outlined.  Danne Baxter, RN, MSN Rehab Admissions Coordinator [phone] 04/26/2021 12:11 PM

## 2021-04-27 ENCOUNTER — Telehealth: Payer: Self-pay | Admitting: Hematology and Oncology

## 2021-04-27 LAB — BASIC METABOLIC PANEL
Anion gap: 7 (ref 5–15)
BUN: 18 mg/dL (ref 8–23)
CO2: 24 mmol/L (ref 22–32)
Calcium: 9 mg/dL (ref 8.9–10.3)
Chloride: 107 mmol/L (ref 98–111)
Creatinine, Ser: 0.64 mg/dL (ref 0.44–1.00)
GFR, Estimated: >60 mL/min (ref >60)
Glucose, Bld: 144 mg/dL — ABNORMAL HIGH (ref 70–99)
Potassium: 4.3 mmol/L (ref 3.5–5.1)
Sodium: 138 mmol/L (ref 135–145)

## 2021-04-27 LAB — CBC
HCT: 37.8 % (ref 36.0–46.0)
Hemoglobin: 12.6 g/dL (ref 12.0–15.0)
MCH: 30.6 pg (ref 26.0–34.0)
MCHC: 33.3 g/dL (ref 30.0–36.0)
MCV: 91.7 fL (ref 80.0–100.0)
Platelets: 337 10*3/uL (ref 150–400)
RBC: 4.12 MIL/uL (ref 3.87–5.11)
RDW: 12.4 % (ref 11.5–15.5)
WBC: 11.2 10*3/uL — ABNORMAL HIGH (ref 4.0–10.5)
nRBC: 0 % (ref 0.0–0.2)

## 2021-04-27 LAB — GLUCOSE, CAPILLARY
Glucose-Capillary: 127 mg/dL — ABNORMAL HIGH (ref 70–99)
Glucose-Capillary: 100 mg/dL — ABNORMAL HIGH (ref 70–99)
Glucose-Capillary: 132 mg/dL — ABNORMAL HIGH (ref 70–99)
Glucose-Capillary: 221 mg/dL — ABNORMAL HIGH (ref 70–99)

## 2021-04-27 MED ORDER — ENOXAPARIN SODIUM 40 MG/0.4ML IJ SOSY
40.0000 mg | PREFILLED_SYRINGE | Freq: Every day | INTRAMUSCULAR | Status: AC
  Administered 2021-04-27 – 2021-04-28 (×2): 40 mg via SUBCUTANEOUS
  Filled 2021-04-27 (×2): qty 0.4

## 2021-04-27 NOTE — Progress Notes (Signed)
Subjective: No acute events overnight.  States that she has not had any seizures or paresthesias since IV Dilantin load yesterday.  Patient's daughter and husband at bedside.  ROS: negative except above Examination  Vital signs in last 24 hours: Temp:  [97.5 F (36.4 C)-99.6 F (37.6 C)] 98.3 F (36.8 C) (08/05 1149) Pulse Rate:  [62-73] 62 (08/05 1149) Resp:  [16-19] 18 (08/05 1149) BP: (107-127)/(51-68) 125/60 (08/05 1149) SpO2:  [93 %-98 %] 94 % (08/05 1149)  General: lying in bed, NAD CVS: pulse-normal rate and rhythm RS: breathing comfortably, symmetric expansion bilaterally Extremities: normal, warm   Neuro: MS: Alert, oriented, follows commands CN: pupils equal and reactive,  EOMI, face symmetric, tongue midline, normal sensation over face, Motor: 5/5 strength in right upper and lower extremity, 4/5 in left upper extremity, 3/left lower extremity  Basic Metabolic Panel: Recent Labs  Lab 04/24/21 1758 04/25/21 0401 04/26/21 0442 04/27/21 0341  NA 139 139 138 138  K 3.9 3.9 4.0 4.3  CL 107 107 104 107  CO2 22 22 23 24   GLUCOSE 103* 165* 148* 144*  BUN 15 11 15 18   CREATININE 0.74 0.50 0.61 0.64  CALCIUM 9.4 9.1 9.8 9.0  MG  --  1.9  --   --   PHOS  --  4.2  --   --     CBC: Recent Labs  Lab 04/24/21 1758 04/25/21 0401 04/26/21 0442 04/27/21 0341  WBC 9.8 6.3 13.2* 11.2*  NEUTROABS 7.9*  --   --   --   HGB 14.1 14.0 13.0 12.6  HCT 44.7 43.0 39.0 37.8  MCV 94.9 93.9 91.5 91.7  PLT 364 319 347 337     Coagulation Studies: No results for input(s): LABPROT, INR in the last 72 hours.  Imaging No new brain imaging overnight  ASSESSMENT AND PLAN: 75 year old right-handed female with history of left breast cancer status postlumpectomy, former smoker who presented on 04/24/2021 with left upper and lower extremity jerking.  MRI brain with and without contrast showed hemorrhagic lesion in the paramedian superior right frontal lobe with moderate surrounding  vasogenic edema consistent with solitary met.  CT chest showed large spiculated left upper lobe suprahilar mass consistent most likely with primary bronchogenic malignancy.   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  IV Dilantin load was given on 04/26/2021.  Has not had any further seizures since.   Recommendations -Continue Keppra 1000 mg twice daily.  -Attempting to avoid Dilantin maintenance as it will interact with chemotherapy if needed in future.  However if seizures recur, depending on seizure frequency and duration, will consider loading with phenytoin and starting maintenance versus increasing maintenance dose of Keppra  -Also on gabapentin 600 mg in the morning and afternoon and 800 mg at bedtime -We will discontinue long-term EEG tomorrow if no seizures overnight -Continue seizure precautions -As needed IV Ativan 2 mg for clinical seizure-like activity lasting more than 5 minutes -Management of rest of comorbidities per primary team   I have spent a total of 25 minutes with the patient reviewing hospital notes,  test results, labs and examining the patient as well as establishing an assessment and plan that was discussed personally with the patien and her 1 daughters and husband at bedside as well as Dr. Waldron Labs.  > 50% of time was spent in direct patient  care.   Zeb Comfort Epilepsy [facility] For questions after 5pm please refer to AMION to reach the Neurologist on call

## 2021-04-27 NOTE — Telephone Encounter (Signed)
Scheduled appt per 8/4 sch msg. Called pt, no answer. Left msg with appt date and time.

## 2021-04-27 NOTE — Progress Notes (Signed)
Inpatient Rehabilitation Admissions Coordinator   I will follow up postoperatively.  Danne Baxter, RN, MSN Rehab Admissions Coordinator [phone] 04/27/2021 8:28 AM

## 2021-04-27 NOTE — Progress Notes (Signed)
Occupational Therapy Treatment Patient Details Name: Tara Murphy MRN: [id number] DOB: 25-Jan-1946 Today's Date: 04/27/2021    History of present illness 75 y.o. female presents to [facility] ED on 04/24/2021 after seizure-like activity (L sided jerking movements). 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 PMH includes left breast cancer status postlumpectomy in 2015, left foot drop, EMG diagnosis of peripheral neuropathy, hyperlipidemia, GERD, chronic anxiety/depression, herpes zoster, former smoker, restless leg syndrome, IBS.   OT comments  Patient wanting to focus on mobility this date, new shoes and AFO per patient.  OT setup BCS near bedroom door, and patient needing up to Min A to stand, and up to Mod A at times for walking to the [facility].  Patient describing increased weakness to her L leg, difficulty with foot placement and clearance.  She was unable to point her L toes/foot forward.  Patient needing a lot of cues for RW sequencing, and a lot of balance support during transitional/turning transfers.  Her debulking surgery is scheduled for Monday, OT to reassess her status post surgery.  CIR is recommended post acute to maximize her functional status.    Follow Up Recommendations  CIR    Equipment Recommendations  3 in 1 bedside commode;Tub/shower seat;Wheelchair (measurements OT);Wheelchair cushion (measurements OT)    Recommendations for Other Services Rehab consult    Precautions / Restrictions Precautions Precautions: Fall Precaution Comments: seizure Required Braces or Orthoses: Other Brace Other Brace: AFO in room Restrictions Weight Bearing Restrictions: No Other Position/Activity Restrictions: foot drop       Mobility Bed Mobility Overal bed mobility: Needs Assistance Bed Mobility: Supine to Sit;Sit to Supine     Supine to sit: Mod assist Sit to supine: Mod assist     Patient Response:  Impulsive;Cooperative  Transfers Overall transfer level: Needs assistance Equipment used: 4-wheeled walker Transfers: Sit to/from [facility] Sit to Stand: Min assist Stand pivot transfers: Mod assist;+2 safety/equipment            Balance Overall balance assessment: Needs assistance Sitting-balance support: Feet supported;Single extremity supported Sitting balance-Leahy Scale: Good Sitting balance - Comments: able to lean forward to remove her R shoe.   Standing balance support: Bilateral upper extremity supported Standing balance-Leahy Scale: Poor Standing balance comment: reliant on 4wrw, and increased difficulty with foot placement - patient feels she is weaker on the L side compared to prior day with PT                           ADL either performed or assessed with clinical judgement   ADL                           Toilet Transfer: Moderate assistance;Ambulation;RW Toilet Transfer Details (indicate cue type and reason): up to Mod A at times to advance L foot, RW management and balance support.                 Vision Patient Visual Report: No change from baseline     Perception     Praxis      Cognition Arousal/Alertness: Awake/alert Behavior During Therapy: Impulsive Overall Cognitive Status: Impaired/Different from baseline Area of Impairment: Attention;Following commands;Safety/judgement;Problem solving                   Current Attention Level: Sustained   Following Commands: Follows one step commands  with increased time Safety/Judgement: Decreased awareness of deficits   Problem Solving: Requires verbal cues;Requires tactile cues          Exercises     Shoulder Instructions       General Comments      Pertinent Vitals/ Pain       Pain Assessment: No/denies pain Pain Intervention(s): Monitored during session                                                           Frequency  Min 2X/week        Progress Toward Goals  OT Goals(current goals can now be found in the care plan section)  Progress towards OT goals: Progressing toward goals  Acute Rehab OT Goals Patient Stated Goal: To be more independent OT Goal Formulation: With patient Time For Goal Achievement: 05/10/21 Potential to Achieve Goals: Good  Plan Discharge plan remains appropriate    Co-evaluation                 AM-PAC OT "6 Clicks" Daily Activity     Outcome Measure   Help from another person eating meals?: None Help from another person taking care of personal grooming?: None Help from another person toileting, which includes using toliet, bedpan, or urinal?: A Lot Help from another person bathing (including washing, rinsing, drying)?: A Lot Help from another person to put on and taking off regular upper body clothing?: A Little Help from another person to put on and taking off regular lower body clothing?: A Lot 6 Click Score: 17    End of Session Equipment Utilized During Treatment: Gait belt;Rolling walker  OT Visit Diagnosis: Unsteadiness on feet (R26.81);Other abnormalities of gait and mobility (R26.89);Muscle weakness (generalized) (M62.81);Pain   Activity Tolerance Patient tolerated treatment well   Patient Left in bed;with call bell/phone within reach;with bed alarm set;with family/visitor present   Nurse Communication          Time: 6237-6283 OT Time Calculation (min): 21 min  Charges: OT General Charges $OT Visit: 1 Visit OT Treatments $Therapeutic Activity: 8-22 mins  04/27/2021  Rich, OTR/L  Acute Rehabilitation Services  Office:  [place] 04/27/2021, 2:17 PM

## 2021-04-27 NOTE — Care Management Important Message (Signed)
Important Message  Patient Details  Name: Tara Murphy MRN: [id number] Date of Birth: 1946-04-25   Medicare Important Message Given:  Yes     Azariyah Luhrs [address] 04/27/2021, 2:35 PM

## 2021-04-27 NOTE — Progress Notes (Addendum)
Overall doing well.  Seizure activity in her left lower extremity is stopped.  She now has fairly good left lower extremity voluntary movement.  She is 3/5 in her quadriceps.  She is 2/5 in her anterior tibialis.  She has 4-/5 in her plantar flexors.  Right lower extremity strength is intact.  We discussed the upcoming surgery on Monday.  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  I discussed the risks involved with surgery including not limited to risk of anesthesia, bleeding, infection, stroke, coma, and death.  I discussed the risk of tumor recurrence and need for further treatment.  The patient is aware of these risks and wishes to proceed.  Surgery scheduled for Monday morning.  Please hold Lovenox prior to surgery.

## 2021-04-27 NOTE — Plan of Care (Signed)

## 2021-04-27 NOTE — Procedures (Addendum)
Patient Name: Tara Murphy  MRN: [id number]  Epilepsy Attending: Lora Havens  Referring Physician/Provider: Dr Donnetta Simpers Duration: 04/26/2021 1206 to 04/27/2021 1206   Patient history:  75 year old female with new onset seizure with metastatic brain lesion. EEG to evaluate for seizure   Level of alertness: Awake, asleep   AEDs during EEG study: LEV, GBP   Technical aspects: This EEG study was done with scalp electrodes positioned according to the 10-20 International system of electrode placement. Electrical activity was acquired at a sampling rate of 500Hz  and reviewed with a high frequency filter of 70Hz  and a low frequency filter of 1Hz . EEG data were recorded continuously and digitally stored.   Description: The posterior dominant rhythm consists of 8-9 Hz activity of moderate voltage (25-35 uV) seen predominantly in posterior head regions, symmetric and reactive to eye opening and eye closing. Sleep was characterized by vertex waves, sleep spindles (12 to 14 Hz), maximal frontocentral region.  Hyperventilation and photic stimulation were not performed.      IMPRESSION: This study is within normal limits. No seizures or epileptiform discharges were seen throughout the recording.   Braeson Rupe Barbra Sarks

## 2021-04-27 NOTE — Progress Notes (Signed)
PROGRESS NOTE    Tara Murphy  QAS:[id number] DOB: 1945/12/06 DOA: 04/24/2021 PCP: Leamon Arnt, MD    Chief Complaint  Patient presents with   Seizures   Extremity Weakness    Brief Narrative:   Tara Murphy is a 75 y.o. female with medical history significant for left breast cancer status postlumpectomy in 2015, left foot drop, EMG diagnosis of peripheral neuropathy, hyperlipidemia, GERD, chronic anxiety/depression, herpes zoster, former smoker, restless leg syndrome, IBS, who presented to [facility] ED from home after having a seizure like activity, likely focal with left lower and left upper extremity uncoordinated uncontrolled jerky movements, witnessed by her husband who is at bedside, accompanied by their daughter.   CT head shows a brain tumor 2 x 2 cm hemorrhagic posterior right frontal lobe mass and associated vasogenic edema.  She was started on Keppra, CT chest/abdomen/pelvis significant for finding of left upper lung spiculated mass suspicious for primary malignancy.  Assessment & Plan:   Active Problems:   GERD (gastroesophageal reflux disease)   Seizure (HCC)   Brain mass   New seizure activity in the setting of newly diagnosed brain metastasis. -Neurosurgery/neurology input greatly appreciated. -Seizure medications per neurology, currently on Keppra, being loaded with Dilantin, she is on long-term EEG. -NeuroSurgery input greatly appreciated, infarct craniotomy and resection of tumor on Monday by Dr. Trenton Gammon.   -Continue with IV steroids  Lung cancer with brain metastasis  -New finding, oncology consulted, they will await results after brain surgery regarding commendation for further treatment.   Elevated BP -IV of IV labetalol as needed   Chronic anxiety/depression On Buspar prior to admission -He was started on as needed Xanax.   Ambulatory dysfunction T/OT consulted   DVT prophylaxis: scd, lovenox DVT prox will be started today  ,discontinue tomorrow and  anticipation for surgery on Monday as discussed with neurosurgery. Code Status: Full Family Communication: husband  at bedSide Disposition:   Status is: Inpatient  Remains inpatient appropriate because:IV treatments appropriate due to intensity of illness or inability to take PO  Dispo: The patient is from: Home              Anticipated d/c is to: Home              Patient currently is not medically stable to d/c.   Difficult to place patient No       Consultants:  Neurology Neurosurgery Oncology   Subjective: Reports  minimal twitching in left leg overnight, denies any chest pain or any new focal deficits.  Objective: Vitals:   04/26/21 2006 04/26/21 2351 04/27/21 0351 04/27/21 0817  BP: 127/68 126/62 107/60 121/61  Pulse: 73 72 65 72  Resp: _0 Temp: 99.6 F (37.6 C) 99.3 F (37.4 C) 99.6 F (37.6 C) 98.5 F (36.9 C)  TempSrc: Oral Oral Oral Oral  SpO2: 95% 93% 94% 98%  Weight:      Height:        Intake/Output Summary (Last 24 hours) at 04/27/2021 1128 Last data filed at 04/27/2021 0300 Gross per 24 hour  Intake --  Output 700 ml  Net -700 ml   Filed Weights   04/24/21 1807  Weight: 94.3 kg    Examination:  Awake Alert, Oriented X 3, No new F.N deficits, Normal affect Symmetrical Chest wall movement, Good air movement bilaterally, CTAB RRR,No Gallops,Rubs or new Murmurs, No Parasternal Heave +ve B.Sounds, Abd Soft, No tenderness, No rebound - guarding or rigidity. No Cyanosis, Clubbing or  edema, No new Rash or bruise        Data Reviewed: I have personally reviewed following labs and imaging studies  CBC: Recent Labs  Lab 04/24/21 1758 04/25/21 0401 04/26/21 0442 04/27/21 0341  WBC 9.8 6.3 13.2* 11.2*  NEUTROABS 7.9*  --   --   --   HGB 14.1 14.0 13.0 12.6  HCT 44.7 43.0 39.0 37.8  MCV 94.9 93.9 91.5 91.7  PLT 364 319 347 762    Basic Metabolic Panel: Recent Labs  Lab 04/24/21 1758 04/25/21 0401 04/26/21 0442  04/27/21 0341  NA 139 139 138 138  K 3.9 3.9 4.0 4.3  CL 107 107 104 107  CO2 _0 GLUCOSE 103* 165* 148* 144*  BUN _1 CREATININE 0.74 0.50 0.61 0.64  CALCIUM 9.4 9.1 9.8 9.0  MG  --  1.9  --   --   PHOS  --  4.2  --   --     GFR: Estimated Creatinine Clearance: 71.4 mL/min (by C-G formula based on SCr of 0.64 mg/dL).  Liver Function Tests: Recent Labs  Lab 04/24/21 1758 04/26/21 0442  AST 37 20  ALT 43 34  ALKPHOS 85 78  BILITOT 0.6 0.5  PROT 7.1 6.1*  ALBUMIN 4.0 3.4*    CBG: Recent Labs  Lab 04/26/21 0614 04/26/21 1152 04/26/21 1545 04/26/21 2108 04/27/21 0641  GLUCAP 156* 131* 161* 177* 127*     Recent Results (from the past 240 hour(s))  SARS CORONAVIRUS 2 (TAT 6-24 HRS) Nasopharyngeal Nasopharyngeal Swab     Status: None   Collection Time: 04/25/21  3:36 PM   Specimen: Nasopharyngeal Swab  Result Value Ref Range Status   SARS Coronavirus 2 NEGATIVE NEGATIVE Final    Comment: (NOTE) SARS-CoV-2 target nucleic acids are NOT DETECTED.  The SARS-CoV-2 RNA is generally detectable in upper and lower respiratory specimens during the acute phase of infection. Negative results do not preclude SARS-CoV-2 infection, do not rule out co-infections with other pathogens, and should not be used as the sole basis for treatment or other patient management decisions. Negative results must be combined with clinical observations, patient history, and epidemiological information. The expected result is Negative.  Fact Sheet for Patients: SugarRoll.be  Fact Sheet for Healthcare Providers: https://www.woods-mathews.com/  This test is not yet approved or cleared by the [place] FDA and  has been authorized for detection and/or diagnosis of SARS-CoV-2 by FDA under an Emergency Use Authorization (EUA). This EUA will remain  in effect (meaning this test can be used) for the duration of the COVID-19  declaration under Se ction 564(b)(1) of the Act, 21 U.S.C. section 360bbb-3(b)(1), unless the authorization is terminated or revoked sooner.  Performed at [facility], [place] [address] [place], [place] [address]          Radiology Studies: CT CHEST ABDOMEN PELVIS W CONTRAST  Result Date: 04/25/2021 CLINICAL DATA:  Cancer of unknown primary, staging, history of left breast cancer in 2015, intracranial mass EXAM: CT CHEST, ABDOMEN, AND PELVIS WITH CONTRAST TECHNIQUE: Multidetector CT imaging of the chest, abdomen and pelvis was performed following the standard protocol during bolus administration of intravenous contrast. CONTRAST:  142m OMNIPAQUE IOHEXOL 300 MG/ML  SOLN COMPARISON:  None. FINDINGS: CT CHEST FINDINGS Cardiovascular: The heart and great vessels are unremarkable without pericardial effusion. Normal caliber of the thoracic aorta. Prominent atherosclerosis of the coronary vasculature. Mediastinum/Nodes: No pathologic mediastinal, hilar, or axillary adenopathy. Thyroid,  trachea, and esophagus are unremarkable. Lungs/Pleura: There is a left upper lobe spiculated suprahilar mass measuring 3.5 x 2.0 by 4.3 cm, consistent with primary bronchogenic neoplasm. There is an indeterminate 8 mm right upper lobe pulmonary nodule image 57/5, concerning for metastatic disease. No other acute airspace disease, effusion, or pneumothorax. Central airways are patent. Musculoskeletal: There are no acute or destructive bony lesions. Reconstructed images demonstrate no additional findings. CT ABDOMEN PELVIS FINDINGS Hepatobiliary: Gallbladder is surgically absent. No focal liver abnormalities. No biliary duct dilation. Pancreas: Unremarkable. No pancreatic ductal dilatation or surrounding inflammatory changes. Spleen: Normal in size without focal abnormality. Adrenals/Urinary Tract: Adrenal glands are unremarkable. Kidneys are normal, without renal calculi, focal lesion, or hydronephrosis. Evaluation of  the bladder is limited due to streak artifact from bilateral hip arthroplasties. No gross abnormality. Stomach/Bowel: No bowel obstruction or ileus. Scattered diverticulosis of the distal colon without diverticulitis. No bowel wall thickening or inflammatory change. Vascular/Lymphatic: Aortic atherosclerosis. No enlarged abdominal or pelvic lymph nodes. Reproductive: Coarse uterine calcifications likely represent degenerating fibroids. No adnexal masses. Other: No free fluid or free gas.  No abdominal wall hernia. Musculoskeletal: Unremarkable bilateral hip arthroplasties. No acute or destructive bony lesions. Reconstructed images demonstrate no additional findings. IMPRESSION: 1. Large spiculated left upper lobe suprahilar mass most consistent with primary bronchogenic malignancy. This may be amenable to endobronchial sampling given proximity to the left upper bronchus. 2. Indeterminate 8 mm right upper lobe pulmonary nodule. Metastatic disease cannot be excluded. Attention on follow-up recommended. 3. No acute intra-abdominal or intrapelvic process. No evidence of subdiaphragmatic metastases. 4. Sigmoid diverticulosis without diverticulitis. 5. Aortic Atherosclerosis (ICD10-I70.0). Coronary artery atherosclerosis. Electronically Signed   By: Randa Ngo M.D.   On: 04/25/2021 19:18   EEG adult  Result Date: 04/25/2021 Lora Havens, MD     04/25/2021 12:46 PM Patient Name: Tara Murphy MRN: [id number] Epilepsy Attending: Lora Havens Referring Physician/Provider: Dr Irene Pap Date: 04/25/2021 Duration: 22.33 mins Patient history:  75 year old female with new onset seizure with metastatic brain lesion. EEG to evaluate for seizure Level of alertness: Awake AEDs during EEG study: LEV, GBP Technical aspects: This EEG study was done with scalp electrodes positioned according to the 10-20 International system of electrode placement. Electrical activity was acquired at a sampling rate of _0  and reviewed  with a high frequency filter of _1  and a low frequency filter of _2 . EEG data were recorded continuously and digitally stored. Description: The posterior dominant rhythm consists of 9 Hz activity of moderate voltage (25-35 uV) seen predominantly in posterior head regions, symmetric and reactive to eye opening and eye closing. Hyperventilation and photic stimulation were not performed.   IMPRESSION: This study is within normal limits. No seizures or epileptiform discharges were seen throughout the recording. Priyanka Barbra Sarks   Overnight EEG with video  Result Date: 04/27/2021 Lora Havens, MD     04/27/2021  9:01 AM Patient Name: Tara Murphy MRN: [id number] Epilepsy Attending: Lora Havens Referring Physician/Provider: Dr Donnetta Simpers Duration: 04/26/2021 1206 to 04/27/2021 0900  Patient history:  75 year old female with new onset seizure with metastatic brain lesion. EEG to evaluate for seizure  Level of alertness: Awake, asleep  AEDs during EEG study: LEV, GBP  Technical aspects: This EEG study was done with scalp electrodes positioned according to the 10-20 International system of electrode placement. Electrical activity was acquired at a sampling rate of _3  and reviewed with a high frequency filter of _4  and a low frequency filter of _5 .  EEG data were recorded continuously and digitally stored.  Description: The posterior dominant rhythm consists of 8-9 Hz activity of moderate voltage (25-35 uV) seen predominantly in posterior head regions, symmetric and reactive to eye opening and eye closing. Sleep was characterized by vertex waves, sleep spindles (12 to 14 Hz), maximal frontocentral region.  Hyperventilation and photic stimulation were not performed.    IMPRESSION: This study is within normal limits. No seizures or epileptiform discharges were seen throughout the recording.  Priyanka Barbra Sarks        Scheduled Meds:  chlorhexidine gluconate (MEDLINE KIT)  15 mL Mouth Rinse BID    dexamethasone (DECADRON) injection  4 mg Intravenous Q6H   diphenhydrAMINE  50 mg Oral Once   Or   diphenhydrAMINE  50 mg Intravenous Once   enoxaparin (LOVENOX) injection  40 mg Subcutaneous Daily   gabapentin  600 mg Oral BID   gabapentin  800 mg Oral Once   insulin aspart  0-5 Units Subcutaneous QHS   insulin aspart  0-9 Units Subcutaneous TID WC   mouth rinse  15 mL Mouth Rinse 10 times per day   pantoprazole (PROTONIX) IV  40 mg Intravenous Q24H   rOPINIRole  4 mg Oral QHS   Continuous Infusions:  levETIRAcetam 1,000 mg (04/27/21 1120)     LOS: 3 days      Phillips Climes, MD [facility]   To contact the attending provider between 7A-7P or the covering provider during after hours 7P-7A, please log into the web site [email or web address] and access using universal [place] password for that web site. If you do not have the password, please call the hospital operator.  04/27/2021, 11:28 AM

## 2021-04-28 ENCOUNTER — Inpatient Hospital Stay (HOSPITAL_COMMUNITY): Payer: Medicare Other

## 2021-04-28 LAB — GLUCOSE, CAPILLARY
Glucose-Capillary: 129 mg/dL — ABNORMAL HIGH (ref 70–99)
Glucose-Capillary: 105 mg/dL — ABNORMAL HIGH (ref 70–99)
Glucose-Capillary: 112 mg/dL — ABNORMAL HIGH (ref 70–99)

## 2021-04-28 MED ORDER — GADOBUTROL 1 MMOL/ML IV SOLN
9.4000 mL | Freq: Once | INTRAVENOUS | Status: AC | PRN
  Administered 2021-04-28: 9.4 mL via INTRAVENOUS

## 2021-04-28 NOTE — Progress Notes (Signed)
Neurosurgery Service Progress Note  Subjective: No acute events overnight by report, pt was at MRI when I was rounding this morning   Objective: Vitals:   04/27/21 1918 04/27/21 2313 04/28/21 0415 04/28/21 0802  BP: 125/61 128/64 122/60 133/64  Pulse: 69 60 62 66  Resp: 17 18 17 16   Temp: 97.6 F (36.4 C) 98 F (36.7 C) 97.7 F (36.5 C) 97.9 F (36.6 C)  TempSrc: Oral Oral Oral Oral  SpO2: 96% 97% 95% 96%  Weight:      Height:        Physical Exam: Pt at MRI  Assessment & Plan: 75 y.o. woman w/ focal seizures 2/2 brain mass.  -no change in neurosurgical plan of care, OR on Monday  Judith Part  04/28/21 10:35 AM

## 2021-04-28 NOTE — Progress Notes (Addendum)
Neurology Progress Note  Brief HPI: 75 y.o. female with PMHx of left breast cancer, s/p chemotherapy, GERD, HLD, neuropathy,  progressive weakness of LLE over approximately one month s/p lumbar MRI with some stenosis and outpatient EMG with some degree of axonal neuropathy but without clear explanation. She presented to the ED 04/24/2021 for evaluation of left leg jerking with involvement of her left hand and shoulder pain due to her shoulder flailing about concerning for seizure activity. CTH on arrival revealed what is likely a 2 x2 cm right frontal lobe metastatic lesion with some intratumoral hemorrhage.   Subjective: No acute overnight events noted  Per husband at bedside, he feels her legs "have calmed down". She reports persistnet weakness at left arm but otherwise back to baseline.   Exam: Vitals:   04/28/21 0802 04/28/21 1135  BP: 133/64 136/68  Pulse: 66 (!) 57  Resp: 16 16  Temp: 97.9 F (36.6 C) 97.9 F (36.6 C)  SpO2: 96% 90%   Gen: Laying in bed with husband at bedside, in no acute distress Resp: non-labored breathing, no respiratory distress. SpO2 96% on telemetry on room air Abd: soft, non-tender, non-distended  Neuro: Mental Status: Awake, alert, and oriented x 4.  She is able to provide a clear and coherent history of present illness.  No signs of aphasia or neglect noted. Cranial Nerves: PERRL, EOMI without ptosis or nystagmus, visual fields are full, facial sensation intact and symmetric to light touch, face appears symmetric resting and with movement, hearing is intact to voice, palate elevates symmetrically, phonation intact, shoulder shrug is symmetric, tongue protrudes midline Motor: 5/5 strength present on the right upper and lower extremity. Left upper extremity strength assessment is slightly pain-limited with 4/5 strength. Left lower extremity with 3/5 strength.  Tone and bulk are normal.  No spontaneous jerking movements, spasms, or tremors noted throughout  assessment.  Sensory: Sensation to light touch is intact and symmetric in upper and lower extremities.  Cerebellar: FNF intact without ataxia bilaterally.  Gait: Deferred  Pertinent Labs: CBC    Component Value Date/Time   WBC 11.2 (H) 04/27/2021 0341   RBC 4.12 04/27/2021 0341   HGB 12.6 04/27/2021 0341   HCT 37.8 04/27/2021 0341   PLT 337 04/27/2021 0341   MCV 91.7 04/27/2021 0341   MCH 30.6 04/27/2021 0341   MCHC 33.3 04/27/2021 0341   RDW 12.4 04/27/2021 0341   LYMPHSABS 1.2 04/24/2021 1758   MONOABS 0.6 04/24/2021 1758   EOSABS 0.0 04/24/2021 1758   BASOSABS 0.0 04/24/2021 1758   CMP     Component Value Date/Time   NA 138 04/27/2021 0341   K 4.3 04/27/2021 0341   CL 107 04/27/2021 0341   CO2 24 04/27/2021 0341   GLUCOSE 144 (H) 04/27/2021 0341   BUN 18 04/27/2021 0341   CREATININE 0.64 04/27/2021 0341   CREATININE 0.69 06/07/2020 1410   CALCIUM 9.0 04/27/2021 0341   PROT 6.1 (L) 04/26/2021 0442   ALBUMIN 3.4 (L) 04/26/2021 0442   AST 20 04/26/2021 0442   ALT 34 04/26/2021 0442   ALKPHOS 78 04/26/2021 0442   BILITOT 0.5 04/26/2021 0442   GFRNONAA >60 04/27/2021 0341   GFRNONAA 86 06/07/2020 1410   GFRAA 100 06/07/2020 1410    Imaging Reviewed:  CT Head without contrast 04/24/2021: Hemorrhagic posterior right frontal lobe mass and associated vasogenic edema, as described above. MRI correlation is recommended.  MRI Brain without contrast 04/24/2021: Hemorrhagic lesion of the paramedian superior right frontal lobe with  moderate surrounding vasogenic edema. This is most consistent with a solitary metastasis.  rEEG: This study is within normal limits. No seizures or epileptiform discharges were seen throughout the recording.  CT chest abdomen pelvis with contrast  04/25/2021  IMPRESSION: 1. Large spiculated left upper lobe suprahilar mass most consistent with primary bronchogenic malignancy. This may be amenable to endobronchial sampling given proximity to the  left upper bronchus. 2. Indeterminate 8 mm right upper lobe pulmonary nodule. Metastatic disease cannot be excluded. Attention on follow-up recommended. 3. No acute intra-abdominal or intrapelvic process. No evidence of subdiaphragmatic metastases. 4. Sigmoid diverticulosis without diverticulitis. 5. Aortic Atherosclerosis (ICD10-I70.0). Coronary artery atherosclerosis.    Assessment: 75 year old female with new onset seizure with metastatic brain lesion.  She also has restless leg syndrome which I think is exacerbating the issue with some reported improvement following a large dose of gabapentin. - On assessment 04/25/2021 patient is without spontaneous jerking, spasms, or tremors throughout assessment. Her assessment is unchanged today.   Impression:  -New onset seizure Hemorrhagic lesion of paramedian superior right frontal lobe with vasogenic edema- likely metastatic lesion. Left upper lobe suprahilar mass consistent with primary bronchogenic malignancy.  Recommendations: - Continue Keppra 1000 mg BID - Can try gabapentin 300mg  along with home ropinorole for restless leg as needed. - EEG with no epileptiform discharges. Will discontinue EEG. - Inpatient seizure precautions   - Neurosurgery on board . 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

## 2021-04-28 NOTE — Procedures (Addendum)
Patient Name: Tara Murphy  MRN: [id number]  Epilepsy Attending: Lora Havens  Referring Physician/Provider: Dr Donnetta Simpers Duration: 04/27/2021 1206 to 04/28/2021 1019   Patient history:  75 year old female with new onset seizure with metastatic brain lesion. EEG to evaluate for seizure   Level of alertness: Awake, asleep   AEDs during EEG study: LEV, GBP   Technical aspects: This EEG study was done with scalp electrodes positioned according to the 10-20 International system of electrode placement. Electrical activity was acquired at a sampling rate of 500Hz  and reviewed with a high frequency filter of 70Hz  and a low frequency filter of 1Hz . EEG data were recorded continuously and digitally stored.   Description: The posterior dominant rhythm consists of 8-9 Hz activity of moderate voltage (25-35 uV) seen predominantly in posterior head regions, symmetric and reactive to eye opening and eye closing. Sleep was characterized by vertex waves, sleep spindles (12 to 14 Hz), maximal frontocentral region.  Hyperventilation and photic stimulation were not performed.      IMPRESSION: This study is within normal limits. No seizures or epileptiform discharges were seen throughout the recording.   Waldine Zenz Barbra Sarks

## 2021-04-28 NOTE — Progress Notes (Signed)
PROGRESS NOTE    Tara Murphy  IWO:[id number] DOB: August 29, 1946 DOA: 04/24/2021 PCP: Leamon Arnt, MD    Chief Complaint  Tara presents with   Seizures   Extremity Weakness    Brief Narrative:   Tara Murphy is a 75 y.o. female with medical history significant for left breast cancer status postlumpectomy in 2015, left foot drop, EMG diagnosis of peripheral neuropathy, hyperlipidemia, GERD, chronic anxiety/depression, herpes zoster, former smoker, restless leg syndrome, IBS, who presented to [facility] ED from home after having a seizure like activity, likely focal with left lower and left upper extremity uncoordinated uncontrolled jerky movements, witnessed by her husband who is at bedside, accompanied by their daughter.   CT head shows a brain tumor 2 x 2 cm hemorrhagic posterior right frontal lobe mass and associated vasogenic edema.  She was started on Keppra, CT chest/abdomen/pelvis significant for finding of left upper lung spiculated mass suspicious for primary malignancy.  04/28/2021: Tara seen alongside Tara's husband and nurse.  Neurosurgery input is appreciated.  For surgery on Monday.  Above documentation is noted.  Hopefully, the brain mass pathology will reveal primary source, if not, we will need to consult pulmonary team for possible endobronchial biopsy of the lung mass.  Assessment & Plan:   Active Problems:   GERD (gastroesophageal reflux disease)   Seizure (HCC)   Brain mass   New seizure activity in the setting of newly diagnosed brain metastasis. -Neurosurgery/neurology input greatly appreciated. -Seizure medications per neurology, currently on Keppra, being loaded with Dilantin, she is on long-term EEG. -NeuroSurgery input greatly appreciated, infarct craniotomy and resection of tumor on Monday by Dr. Trenton Gammon.   -Continue with IV steroids 04/28/2021: No further seizures noted.  Continue to monitor closely.  Seizure precaution.  Tara is on IV Keppra and IV  steroids.  Lung cancer with brain metastasis  -New finding, oncology consulted, they will await results after brain surgery regarding commendation for further treatment. 04/28/2021: Imaging studies have revealed spiculated right lung mass and brain mass.  For resection of the brain mass on Monday.  Hopefully, brain mass will reveal primary pathology, if not, will consider pulmonary consult for possible endobronchial biopsy.   Elevated BP -IV of IV labetalol as needed H6 22: Blood pressure is better controlled.   Chronic anxiety/depression On Buspar prior to admission -He was started on as needed Xanax. 04/28/2021: We will avoid long-term Xanax use.   Ambulatory dysfunction T/OT consulted   DVT prophylaxis: scd, Code Status: Full Family Communication: husband  at bedSide Disposition:   Status is: Inpatient  Remains inpatient appropriate because:IV treatments appropriate due to intensity of illness or inability to take PO  Dispo: The Tara is from: Home              Anticipated d/c is to: Home              Tara currently is not medically stable to d/c.   Difficult to place Tara No       Consultants:  Neurology Neurosurgery Oncology   Subjective: No new complaints.    Objective: Vitals:   04/28/21 0415 04/28/21 0802 04/28/21 1135 04/28/21 1545  BP: 122/60 133/64 136/68 121/61  Pulse: 62 66 (!) 57 (!) 58  Resp: 17 16 16 16   Temp: 97.7 F (36.5 C) 97.9 F (36.6 C) 97.9 F (36.6 C) 97.6 F (36.4 C)  TempSrc: Oral Oral Oral Oral  SpO2: 95% 96% 90% 94%  Weight:      Height:  Intake/Output Summary (Last 24 hours) at 04/28/2021 1757 Last data filed at 04/28/2021 1200 Gross per 24 hour  Intake 461.89 ml  Output --  Net 461.89 ml    Filed Weights   04/24/21 1807  Weight: 94.3 kg    Examination: General condition: Tara is awake and alert.  Tara is not in any distress. HEENT: Not pale.  No jaundice. Neck: Supple.  No raised JVD. Lungs:  Clear to auscultation. CVS: S1-S2. Abdomen: Soft and nontender. Neuro: Awake and alert.  Tara moves all extremities. Extremities: No leg edema.  Data Reviewed: I have personally reviewed following labs and imaging studies  CBC: Recent Labs  Lab 04/24/21 1758 04/25/21 0401 04/26/21 0442 04/27/21 0341  WBC 9.8 6.3 13.2* 11.2*  NEUTROABS 7.9*  --   --   --   HGB 14.1 14.0 13.0 12.6  HCT 44.7 43.0 39.0 37.8  MCV 94.9 93.9 91.5 91.7  PLT 364 319 347 337     Basic Metabolic Panel: Recent Labs  Lab 04/24/21 1758 04/25/21 0401 04/26/21 0442 04/27/21 0341  NA 139 139 138 138  K 3.9 3.9 4.0 4.3  CL 107 107 104 107  CO2 22 22 23 24   GLUCOSE 103* 165* 148* 144*  BUN 15 11 15 18   CREATININE 0.74 0.50 0.61 0.64  CALCIUM 9.4 9.1 9.8 9.0  MG  --  1.9  --   --   PHOS  --  4.2  --   --      GFR: Estimated Creatinine Clearance: 71.4 mL/min (by C-G formula based on SCr of 0.64 mg/dL).  Liver Function Tests: Recent Labs  Lab 04/24/21 1758 04/26/21 0442  AST 37 20  ALT 43 34  ALKPHOS 85 78  BILITOT 0.6 0.5  PROT 7.1 6.1*  ALBUMIN 4.0 3.4*     CBG: Recent Labs  Lab 04/27/21 1639 04/27/21 2143 04/28/21 0553 04/28/21 1132 04/28/21 1542  GLUCAP 132* 221* 129* 105* 112*      Recent Results (from the past 240 hour(s))  SARS CORONAVIRUS 2 (TAT 6-24 HRS) Nasopharyngeal Nasopharyngeal Swab     Status: None   Collection Time: 04/25/21  3:36 PM   Specimen: Nasopharyngeal Swab  Result Value Ref Range Status   SARS Coronavirus 2 NEGATIVE NEGATIVE Final    Comment: (NOTE) SARS-CoV-2 target nucleic acids are NOT DETECTED.  The SARS-CoV-2 RNA is generally detectable in upper and lower respiratory specimens during the acute phase of infection. Negative results do not preclude SARS-CoV-2 infection, do not rule out co-infections with other pathogens, and should not be used as the sole basis for treatment or other Tara management decisions. Negative results must be  combined with clinical observations, Tara history, and epidemiological information. The expected result is Negative.  Fact Sheet for Patients: SugarRoll.be  Fact Sheet for Healthcare Providers: https://www.woods-mathews.com/  This test is not yet approved or cleared by the [place] FDA and  has been authorized for detection and/or diagnosis of SARS-CoV-2 by FDA under an Emergency Use Authorization (EUA). This EUA will remain  in effect (meaning this test can be used) for the duration of the COVID-19 declaration under Se ction 564(b)(1) of the Act, 21 U.S.C. section 360bbb-3(b)(1), unless the authorization is terminated or revoked sooner.  Performed at [facility], [place] [address], [place], [place] [address]           Radiology Studies: Overnight EEG with video  Result Date: 04/27/2021 Lora Havens, MD     04/28/2021  6:56 AM Tara Murphy MRN: [id number] Epilepsy Attending: Lora Havens Referring Physician/Provider: Dr Donnetta Simpers Duration: 04/26/2021 1206 to 04/27/2021 1206  Tara history:  75 year old female with new onset seizure with metastatic brain lesion. EEG to evaluate for seizure  Level of alertness: Awake, asleep  AEDs during EEG study: LEV, GBP  Technical aspects: This EEG study was done with scalp electrodes positioned according to the 10-20 International system of electrode placement. Electrical activity was acquired at a sampling rate of 500Hz  and reviewed with a high frequency filter of 70Hz  and a low frequency filter of 1Hz . EEG data were recorded continuously and digitally stored.  Description: The posterior dominant rhythm consists of 8-9 Hz activity of moderate voltage (25-35 uV) seen predominantly in posterior head regions, symmetric and reactive to eye opening and eye closing. Sleep was characterized by vertex waves, sleep spindles (12 to 14 Hz), maximal frontocentral region.   Hyperventilation and photic stimulation were not performed.    IMPRESSION: This study is within normal limits. No seizures or epileptiform discharges were seen throughout the recording.  Priyanka Barbra Sarks        Scheduled Meds:  chlorhexidine gluconate (MEDLINE KIT)  15 mL Mouth Rinse BID   dexamethasone (DECADRON) injection  4 mg Intravenous Q6H   diphenhydrAMINE  50 mg Oral Once   Or   diphenhydrAMINE  50 mg Intravenous Once   gabapentin  600 mg Oral BID   gabapentin  800 mg Oral Once   insulin aspart  0-5 Units Subcutaneous QHS   insulin aspart  0-9 Units Subcutaneous TID WC   mouth rinse  15 mL Mouth Rinse 10 times per day   pantoprazole (PROTONIX) IV  40 mg Intravenous Q24H   rOPINIRole  4 mg Oral QHS   Continuous Infusions:  levETIRAcetam 1,000 mg (04/28/21 1017)     LOS: 4 days      Bonnell Public, MD [facility]   To contact the attending provider between 7A-7P or the covering provider during after hours 7P-7A, please log into the web site [email or web address] and access using universal [place] password for that web site. If you do not have the password, please call the hospital operator.  04/28/2021, 5:57 PM

## 2021-04-28 NOTE — Progress Notes (Signed)
vLTM EEG complete. No skin breakdown 

## 2021-04-29 DIAGNOSIS — K219 Gastro-esophageal reflux disease without esophagitis: Secondary | ICD-10-CM

## 2021-04-29 LAB — GLUCOSE, CAPILLARY
Glucose-Capillary: 117 mg/dL — ABNORMAL HIGH (ref 70–99)
Glucose-Capillary: 98 mg/dL (ref 70–99)
Glucose-Capillary: 126 mg/dL — ABNORMAL HIGH (ref 70–99)
Glucose-Capillary: 215 mg/dL — ABNORMAL HIGH (ref 70–99)

## 2021-04-29 LAB — CBC
HCT: 43.4 % (ref 36.0–46.0)
Hemoglobin: 14.7 g/dL (ref 12.0–15.0)
MCH: 30.5 pg (ref 26.0–34.0)
MCHC: 33.9 g/dL (ref 30.0–36.0)
MCV: 90 fL (ref 80.0–100.0)
Platelets: 354 10*3/uL (ref 150–400)
RBC: 4.82 MIL/uL (ref 3.87–5.11)
RDW: 12.1 % (ref 11.5–15.5)
WBC: 11.1 10*3/uL — ABNORMAL HIGH (ref 4.0–10.5)
nRBC: 0 % (ref 0.0–0.2)

## 2021-04-29 MED ORDER — CHLORHEXIDINE GLUCONATE CLOTH 2 % EX PADS
6.0000 | MEDICATED_PAD | Freq: Once | CUTANEOUS | Status: AC
  Administered 2021-04-29: 6 via TOPICAL

## 2021-04-29 MED ORDER — MUPIROCIN 2 % EX OINT
1.0000 "application " | TOPICAL_OINTMENT | Freq: Two times a day (BID) | CUTANEOUS | Status: DC
  Administered 2021-04-30: 1 via NASAL
  Filled 2021-04-29 (×2): qty 22

## 2021-04-29 MED ORDER — CEFAZOLIN SODIUM-DEXTROSE 2-4 GM/100ML-% IV SOLN
2.0000 g | INTRAVENOUS | Status: AC
  Administered 2021-04-30: 2 g via INTRAVENOUS
  Filled 2021-04-29: qty 100

## 2021-04-29 MED ORDER — DEXAMETHASONE SODIUM PHOSPHATE 10 MG/ML IJ SOLN
10.0000 mg | Freq: Once | INTRAMUSCULAR | Status: AC
  Administered 2021-04-29: 10 mg via INTRAVENOUS
  Filled 2021-04-29: qty 1

## 2021-04-29 NOTE — Progress Notes (Signed)
PROGRESS NOTE    Tara Murphy  SEG:[id number] DOB: 12-03-1945 DOA: 04/24/2021 PCP: Leamon Arnt, MD    Chief Complaint  Patient presents with   Seizures   Extremity Weakness    Brief Narrative:   Tara Murphy is a 75 y.o. female with medical history significant for left breast cancer status postlumpectomy in 2015, left foot drop, EMG diagnosis of peripheral neuropathy, hyperlipidemia, GERD, chronic anxiety/depression, herpes zoster, former smoker, restless leg syndrome, IBS, who presented to [facility] ED from home after having a seizure like activity, likely focal with left lower and left upper extremity uncoordinated uncontrolled jerky movements, witnessed by her husband who is at bedside, accompanied by their daughter.   CT head shows a brain tumor 2 x 2 cm hemorrhagic posterior right frontal lobe mass and associated vasogenic edema.  She was started on Keppra, CT chest/abdomen/pelvis significant for finding of left upper lung spiculated mass suspicious for primary malignancy.  04/28/2021: Patient seen alongside patient's husband and nurse.  Neurosurgery input is appreciated.  For surgery on Monday.  Above documentation is noted.  Hopefully, the brain mass pathology will reveal primary source, if not, we will need to consult pulmonary team for possible endobronchial biopsy of the lung mass.  04/29/2021: Patient seen alongside patient's husband and stepdaughter.  No new changes.  For surgery tomorrow.  Assessment & Plan:   Active Problems:   GERD (gastroesophageal reflux disease)   Seizure (HCC)   Brain mass   New seizure activity in the setting of newly diagnosed brain metastasis. -Neurosurgery/neurology input greatly appreciated. -Seizure medications per neurology, currently on Keppra, being loaded with Dilantin, she is on long-term EEG. -NeuroSurgery input greatly appreciated, infarct craniotomy and resection of tumor on Monday by Dr. Trenton Gammon.   -Continue with IV steroids 04/28/2021: No  further seizures noted.  Continue to monitor closely.  Seizure precaution.  Patient is on IV Keppra and IV steroids. 04/29/2021: No seizures reported.  Lung cancer with brain metastasis  -New finding, oncology consulted, they will await results after brain surgery regarding commendation for further treatment. 04/28/2021: Imaging studies have revealed spiculated right lung mass and brain mass.  For resection of the brain mass on Monday.  Hopefully, brain mass will reveal primary pathology, if not, will consider pulmonary consult for possible endobronchial biopsy.   Elevated BP -IV of IV labetalol as needed H6 22: Blood pressure is better controlled.   Chronic anxiety/depression On Buspar prior to admission -He was started on as needed Xanax. 04/28/2021: We will avoid long-term Xanax use.   Ambulatory dysfunction T/OT consulted   DVT prophylaxis: scd, Code Status: Full Family Communication: husband  at bedSide Disposition:   Status is: Inpatient  Remains inpatient appropriate because:IV treatments appropriate due to intensity of illness or inability to take PO  Dispo: The patient is from: Home              Anticipated d/c is to: Home              Patient currently is not medically stable to d/c.   Difficult to place patient No       Consultants:  Neurology Neurosurgery Oncology   Subjective: No new complaints.    Objective: Vitals:   04/29/21 0821 04/29/21 1240 04/29/21 1653 04/29/21 2000  BP: (!) 152/75 (!) 150/74 136/70 (!) 155/68  Pulse: 82 71 72 73  Resp: 18 16 18 18   Temp: 97.9 F (36.6 C) 98.1 F (36.7 C) 98.7 F (37.1 C) 97.7 F (36.5  C)  TempSrc: Oral Oral Oral Oral  SpO2: 97% 97% 95% 95%  Weight:      Height:        Intake/Output Summary (Last 24 hours) at 04/29/2021 2156 Last data filed at 04/29/2021 2150 Gross per 24 hour  Intake 340 ml  Output 2400 ml  Net -2060 ml    Filed Weights   04/24/21 1807  Weight: 94.3 kg    Examination: General  condition: Patient is awake and alert.  Patient is not in any distress. HEENT: Not pale.  No jaundice. Neck: Supple.  No raised JVD. Lungs: Clear to auscultation. CVS: S1-S2. Abdomen: Soft and nontender. Neuro: Awake and alert.  Patient moves all extremities. Extremities: No leg edema.  Data Reviewed: I have personally reviewed following labs and imaging studies  CBC: Recent Labs  Lab 04/24/21 1758 04/25/21 0401 04/26/21 0442 04/27/21 0341 04/29/21 1442  WBC 9.8 6.3 13.2* 11.2* 11.1*  NEUTROABS 7.9*  --   --   --   --   HGB 14.1 14.0 13.0 12.6 14.7  HCT 44.7 43.0 39.0 37.8 43.4  MCV 94.9 93.9 91.5 91.7 90.0  PLT 364 319 347 337 354     Basic Metabolic Panel: Recent Labs  Lab 04/24/21 1758 04/25/21 0401 04/26/21 0442 04/27/21 0341  NA 139 139 138 138  K 3.9 3.9 4.0 4.3  CL 107 107 104 107  CO2 22 22 23 24   GLUCOSE 103* 165* 148* 144*  BUN 15 11 15 18   CREATININE 0.74 0.50 0.61 0.64  CALCIUM 9.4 9.1 9.8 9.0  MG  --  1.9  --   --   PHOS  --  4.2  --   --      GFR: Estimated Creatinine Clearance: 71.4 mL/min (by C-G formula based on SCr of 0.64 mg/dL).  Liver Function Tests: Recent Labs  Lab 04/24/21 1758 04/26/21 0442  AST 37 20  ALT 43 34  ALKPHOS 85 78  BILITOT 0.6 0.5  PROT 7.1 6.1*  ALBUMIN 4.0 3.4*     CBG: Recent Labs  Lab 04/28/21 1542 04/29/21 0639 04/29/21 1236 04/29/21 1656 04/29/21 2151  GLUCAP 112* 117* 98 126* 215*      Recent Results (from the past 240 hour(s))  SARS CORONAVIRUS 2 (TAT 6-24 HRS) Nasopharyngeal Nasopharyngeal Swab     Status: None   Collection Time: 04/25/21  3:36 PM   Specimen: Nasopharyngeal Swab  Result Value Ref Range Status   SARS Coronavirus 2 NEGATIVE NEGATIVE Final    Comment: (NOTE) SARS-CoV-2 target nucleic acids are NOT DETECTED.  The SARS-CoV-2 RNA is generally detectable in upper and lower respiratory specimens during the acute phase of infection. Negative results do not preclude  SARS-CoV-2 infection, do not rule out co-infections with other pathogens, and should not be used as the sole basis for treatment or other patient management decisions. Negative results must be combined with clinical observations, patient history, and epidemiological information. The expected result is Negative.  Fact Sheet for Patients: SugarRoll.be  Fact Sheet for Healthcare Providers: https://www.woods-mathews.com/  This test is not yet approved or cleared by the [place] FDA and  has been authorized for detection and/or diagnosis of SARS-CoV-2 by FDA under an Emergency Use Authorization (EUA). This EUA will remain  in effect (meaning this test can be used) for the duration of the COVID-19 declaration under Se ction 564(b)(1) of the Act, 21 U.S.C. section 360bbb-3(b)(1), unless the authorization is terminated or revoked sooner.  Performed at [facility]  [facility], [place] [address], [place], [place] [address]           Radiology Studies: MR BRAIN W WO CONTRAST  Result Date: 04/28/2021 CLINICAL DATA:  Left lower extremity weakness. Breast carcinoma. Brain lab protocol. EXAM: MRI HEAD WITHOUT AND WITH CONTRAST TECHNIQUE: Multiplanar, multiecho pulse sequences of the brain and surrounding structures were obtained without and with intravenous contrast. CONTRAST:  9.1m GADAVIST GADOBUTROL 1 MMOL/ML IV SOLN COMPARISON:  Brain MRI 04/24/2021 FINDINGS: Brain: Unchanged appearance of hemorrhagic lesion in the paramedian right frontal lobe with surrounding edema. Blood fluid level within the lesion. No midline shift. Vascular: Major flow voids are preserved. Skull and upper cervical spine: Normal calvarium and skull base. Visualized upper cervical spine and soft tissues are normal. Sinuses/Orbits:No paranasal sinus fluid levels or advanced mucosal thickening. No mastoid or middle ear effusion. Normal orbits. IMPRESSION: BrainLAB protocol showing  unchanged appearance of hemorrhagic lesion in the paramedian right frontal lobe with surrounding edema. Electronically Signed   By: KUlyses JarredM.D.   On: 04/28/2021 20:09        Scheduled Meds:  chlorhexidine gluconate (MEDLINE KIT)  15 mL Mouth Rinse BID   Chlorhexidine Gluconate Cloth  6 each Topical Once   And   Chlorhexidine Gluconate Cloth  6 each Topical Once   dexamethasone (DECADRON) injection  10 mg Intravenous Once   dexamethasone (DECADRON) injection  4 mg Intravenous Q6H   diphenhydrAMINE  50 mg Oral Once   Or   diphenhydrAMINE  50 mg Intravenous Once   gabapentin  600 mg Oral BID   gabapentin  800 mg Oral Once   insulin aspart  0-5 Units Subcutaneous QHS   insulin aspart  0-9 Units Subcutaneous TID WC   mouth rinse  15 mL Mouth Rinse 10 times per day   pantoprazole (PROTONIX) IV  40 mg Intravenous Q24H   rOPINIRole  4 mg Oral QHS   Continuous Infusions:  [START ON 04/30/2021]  ceFAZolin (ANCEF) IV     levETIRAcetam 1,000 mg (04/29/21 0913)     LOS: 5 days      SBonnell Public MD [facility]   To contact the attending provider between 7A-7P or the covering provider during after hours 7P-7A, please log into the web site [email or web address] and access using universal [place] password for that web site. If you do not have the password, please call the hospital operator.  04/29/2021, 9:56 PM

## 2021-04-29 NOTE — Progress Notes (Signed)
Neurosurgery Service Progress Note  Subjective: No acute events overnight, no new complaints  Objective: Vitals:   04/28/21 2300 04/29/21 0300 04/29/21 0821 04/29/21 1240  BP: (!) 119/57 134/69 (!) 152/75 (!) 150/74  Pulse: (!) 57 (!) 58 82 71  Resp: 18 18 18 16   Temp: 97.9 F (36.6 C) 98.1 F (36.7 C) 97.9 F (36.6 C) 98.1 F (36.7 C)  TempSrc: Oral Oral Oral Oral  SpO2: 93% 96% 97% 97%  Weight:      Height:        Physical Exam: Stable LLE > LUE weakness / apraxia   Assessment & Plan: 75 y.o. woman w/ focal seizures 2/2 brain mass.  -OR tomorrow, NPO p MN, MRI reviewed and is diagnostic quality for navigation, answered the family's questions, per request arranged for her films to be pushed to [facility] for Dr. Domingo Cocking to review  Judith Part  04/29/21 1:28 PM

## 2021-04-29 NOTE — Anesthesia Preprocedure Evaluation (Addendum)
Anesthesia Evaluation  Patient identified by MRN, date of birth, ID band Patient awake    Reviewed: Allergy & Precautions, NPO status , Patient's Chart, lab work & pertinent test results  History of Anesthesia Complications (+) MALIGNANT HYPERTHERMIA  Airway Mallampati: II  TM Distance: >3 FB Neck ROM: Full    Dental no notable dental hx.    Pulmonary former smoker,    Pulmonary exam normal breath sounds clear to auscultation       Cardiovascular Exercise Tolerance: Good negative cardio ROS Normal cardiovascular exam Rhythm:Regular Rate:Normal     Neuro/Psych Seizures -,  PSYCHIATRIC DISORDERS Depression Brain mass    GI/Hepatic Neg liver ROS, GERD  ,  Endo/Other  hyperlipidemia  Renal/GU negative Renal ROS  negative genitourinary   Musculoskeletal  (+) Arthritis , Osteoarthritis,    Abdominal   Peds negative pediatric ROS (+)  Hematology negative hematology ROS (+)   Anesthesia Other Findings   Reproductive/Obstetrics negative OB ROS                           Anesthesia Physical Anesthesia Plan  ASA: 3  Anesthesia Plan: General   Post-op Pain Management:    Induction: Intravenous  PONV Risk Score and Plan: 3 and Treatment may vary due to age or medical condition, Ondansetron, Dexamethasone, Propofol infusion and TIVA  Airway Management Planned: Oral ETT  Additional Equipment: Arterial line  Intra-op Plan:   Post-operative Plan:   Informed Consent: I have reviewed the patients History and Physical, chart, labs and discussed the procedure including the risks, benefits and alternatives for the proposed anesthesia with the patient or authorized representative who has indicated his/her understanding and acceptance.     Dental advisory given  Plan Discussed with: CRNA, Anesthesiologist and Surgeon  Anesthesia Plan Comments: (GETA. + arterial line. 2 PIV. Propofol gtt;  sufentanil gtt  Last several anesthetics reviewed. Patient has received sufentanil in several anesthetics.  Patient has dilaudid and fentanyl listed as medication intolerances.Norton Blizzard, MD  )       Anesthesia Quick Evaluation

## 2021-04-29 NOTE — Progress Notes (Signed)
   04/29/21 1100  Clinical Encounter Type  Visited With Patient and family together  Visit Type Initial  Referral From Nurse  Consult/Referral To Chaplain  Spiritual Encounters  Spiritual Needs Literature (HCPOA)  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 She will be having surgery tomorrow. AD form was filled out and 2 volunteered witnesses and a personal notary were able to notarize the HCPOA. Copies made for Primary and Secondary, original given to [name] and a copy placed in her chart.    Chaplain Marianna Cid Morgan-Simpson [phone]

## 2021-04-30 ENCOUNTER — Inpatient Hospital Stay (HOSPITAL_COMMUNITY): Payer: Medicare Other | Admitting: Anesthesiology

## 2021-04-30 ENCOUNTER — Inpatient Hospital Stay (HOSPITAL_COMMUNITY)
Admission: EM | Disposition: A | Discharge status: Rehabilitation Facility | Payer: Self-pay | Source: Home / Self Care | Attending: Internal Medicine

## 2021-04-30 HISTORY — PX: APPLICATION OF CRANIAL NAVIGATION: SHX6578

## 2021-04-30 HISTORY — PX: CRANIOTOMY: SHX93

## 2021-04-30 LAB — POCT I-STAT 7, (LYTES, BLD GAS, ICA,H+H)
Acid-base deficit: 1 mmol/L (ref 0.0–2.0)
Bicarbonate: 23.7 mmol/L (ref 20.0–28.0)
Calcium, Ion: 1.19 mmol/L (ref 1.15–1.40)
HCT: 41 % (ref 36.0–46.0)
Hemoglobin: 13.9 g/dL (ref 12.0–15.0)
O2 Saturation: 100 %
Potassium: 4.3 mmol/L (ref 3.5–5.1)
Sodium: 138 mmol/L (ref 135–145)
TCO2: 25 mmol/L (ref 22–32)
pCO2 arterial: 36.6 mmHg (ref 32.0–48.0)
pH, Arterial: 7.418 (ref 7.350–7.450)
pO2, Arterial: 173 mmHg — ABNORMAL HIGH (ref 83.0–108.0)

## 2021-04-30 LAB — GLUCOSE, CAPILLARY
Glucose-Capillary: 156 mg/dL — ABNORMAL HIGH (ref 70–99)
Glucose-Capillary: 151 mg/dL — ABNORMAL HIGH (ref 70–99)
Glucose-Capillary: 137 mg/dL — ABNORMAL HIGH (ref 70–99)

## 2021-04-30 LAB — RENAL FUNCTION PANEL
Albumin: 3.4 g/dL — ABNORMAL LOW (ref 3.5–5.0)
Anion gap: 11 (ref 5–15)
BUN: 20 mg/dL (ref 8–23)
CO2: 19 mmol/L — ABNORMAL LOW (ref 22–32)
Calcium: 9.1 mg/dL (ref 8.9–10.3)
Chloride: 105 mmol/L (ref 98–111)
Creatinine, Ser: 0.56 mg/dL (ref 0.44–1.00)
GFR, Estimated: >60 mL/min (ref >60)
Glucose, Bld: 140 mg/dL — ABNORMAL HIGH (ref 70–99)
Phosphorus: 4.4 mg/dL (ref 2.5–4.6)
Potassium: 5.3 mmol/L — ABNORMAL HIGH (ref 3.5–5.1)
Sodium: 135 mmol/L (ref 135–145)

## 2021-04-30 LAB — SURGICAL PCR SCREEN
MRSA, PCR: NEGATIVE
Staphylococcus aureus: NEGATIVE

## 2021-04-30 LAB — PREPARE RBC (CROSSMATCH)

## 2021-04-30 SURGERY — CRANIOTOMY TUMOR EXCISION
Anesthesia: General | Site: Head | Laterality: Right

## 2021-04-30 MED ORDER — DEXAMETHASONE SODIUM PHOSPHATE 10 MG/ML IJ SOLN
INTRAMUSCULAR | Status: AC
  Filled 2021-04-30: qty 1

## 2021-04-30 MED ORDER — CEFAZOLIN SODIUM-DEXTROSE 2-4 GM/100ML-% IV SOLN
2.0000 g | Freq: Three times a day (TID) | INTRAVENOUS | Status: AC
Start: 2021-04-30 — End: 2021-04-30
  Administered 2021-04-30 (×2): 2 g via INTRAVENOUS
  Filled 2021-04-30 (×2): qty 100

## 2021-04-30 MED ORDER — PHENYLEPHRINE 40 MCG/ML (10ML) SYRINGE FOR IV PUSH (FOR BLOOD PRESSURE SUPPORT)
PREFILLED_SYRINGE | INTRAVENOUS | Status: AC
  Filled 2021-04-30: qty 10

## 2021-04-30 MED ORDER — SUGAMMADEX SODIUM 200 MG/2ML IV SOLN
INTRAVENOUS | Status: DC | PRN
  Administered 2021-04-30: 200 mg via INTRAVENOUS

## 2021-04-30 MED ORDER — SUFENTANIL CITRATE 50 MCG/ML IV SOLN
INTRAVENOUS | Status: DC | PRN
  Administered 2021-04-30 (×3): 10 ug via INTRAVENOUS

## 2021-04-30 MED ORDER — THROMBIN 20000 UNITS EX SOLR
CUTANEOUS | Status: AC
  Filled 2021-04-30: qty 20000

## 2021-04-30 MED ORDER — ONDANSETRON HCL 4 MG/2ML IJ SOLN: 4.0000 mg | INTRAMUSCULAR | Status: DC | PRN

## 2021-04-30 MED ORDER — LIDOCAINE 2% (20 MG/ML) 5 ML SYRINGE
INTRAMUSCULAR | Status: AC
  Filled 2021-04-30: qty 5

## 2021-04-30 MED ORDER — SODIUM CHLORIDE 0.9 % IV SOLN: INTRAVENOUS | Status: DC | PRN

## 2021-04-30 MED ORDER — ESMOLOL HCL 100 MG/10ML IV SOLN
INTRAVENOUS | Status: AC
  Filled 2021-04-30: qty 10

## 2021-04-30 MED ORDER — LIDOCAINE-EPINEPHRINE 1 %-1:100000 IJ SOLN
INTRAMUSCULAR | Status: DC | PRN
  Administered 2021-04-30: 10 mL

## 2021-04-30 MED ORDER — HYDRALAZINE HCL 20 MG/ML IJ SOLN
INTRAMUSCULAR | Status: DC | PRN
  Administered 2021-04-30 (×2): 5 mg via INTRAVENOUS

## 2021-04-30 MED ORDER — HYDROMORPHONE HCL 1 MG/ML IJ SOLN
INTRAMUSCULAR | Status: AC
  Filled 2021-04-30: qty 1

## 2021-04-30 MED ORDER — HYDRALAZINE HCL 20 MG/ML IJ SOLN
INTRAMUSCULAR | Status: AC
  Filled 2021-04-30: qty 1

## 2021-04-30 MED ORDER — BACITRACIN ZINC 500 UNIT/GM EX OINT
TOPICAL_OINTMENT | CUTANEOUS | Status: DC | PRN
  Administered 2021-04-30 (×2): 1 via TOPICAL

## 2021-04-30 MED ORDER — ONDANSETRON HCL 4 MG/2ML IJ SOLN
INTRAMUSCULAR | Status: DC | PRN
  Administered 2021-04-30: 4 mg via INTRAVENOUS

## 2021-04-30 MED ORDER — HYDROMORPHONE HCL 1 MG/ML IJ SOLN
0.2500 mg | INTRAMUSCULAR | Status: DC | PRN
  Administered 2021-04-30 (×2): 0.25 mg via INTRAVENOUS

## 2021-04-30 MED ORDER — MIDAZOLAM HCL 2 MG/2ML IJ SOLN
INTRAMUSCULAR | Status: AC
  Filled 2021-04-30: qty 2

## 2021-04-30 MED ORDER — THROMBIN 5000 UNITS EX SOLR
CUTANEOUS | Status: AC
  Filled 2021-04-30: qty 10000

## 2021-04-30 MED ORDER — PROMETHAZINE HCL 25 MG PO TABS: 12.5000 mg | ORAL_TABLET | ORAL | Status: DC | PRN

## 2021-04-30 MED ORDER — SODIUM CHLORIDE 0.9 % IV SOLN: INTRAVENOUS | Status: DC

## 2021-04-30 MED ORDER — ORAL CARE MOUTH RINSE: 15.0000 mL | Freq: Once | OROMUCOSAL | Status: AC

## 2021-04-30 MED ORDER — NALOXONE HCL 0.4 MG/ML IJ SOLN: 0.0800 mg | INTRAMUSCULAR | Status: DC | PRN

## 2021-04-30 MED ORDER — ROCURONIUM BROMIDE 10 MG/ML (PF) SYRINGE
PREFILLED_SYRINGE | INTRAVENOUS | Status: DC | PRN
  Administered 2021-04-30: 100 mg via INTRAVENOUS
  Administered 2021-04-30: 50 mg via INTRAVENOUS

## 2021-04-30 MED ORDER — ACETAMINOPHEN 10 MG/ML IV SOLN
INTRAVENOUS | Status: AC
  Filled 2021-04-30: qty 100

## 2021-04-30 MED ORDER — PROPOFOL 10 MG/ML IV BOLUS
INTRAVENOUS | Status: AC
  Filled 2021-04-30: qty 20

## 2021-04-30 MED ORDER — CHLORHEXIDINE GLUCONATE CLOTH 2 % EX PADS
6.0000 | MEDICATED_PAD | Freq: Every day | CUTANEOUS | Status: DC
  Administered 2021-04-30 – 2021-05-01 (×2): 6 via TOPICAL

## 2021-04-30 MED ORDER — THROMBIN 20000 UNITS EX SOLR: CUTANEOUS | Status: DC | PRN

## 2021-04-30 MED ORDER — AMISULPRIDE (ANTIEMETIC) 5 MG/2ML IV SOLN: 10.0000 mg | Freq: Once | INTRAVENOUS | Status: DC | PRN

## 2021-04-30 MED ORDER — HYDROCODONE-ACETAMINOPHEN 5-325 MG PO TABS
1.0000 | ORAL_TABLET | ORAL | Status: DC | PRN
  Administered 2021-04-30 – 2021-05-02 (×5): 1 via ORAL
  Filled 2021-04-30 (×5): qty 1

## 2021-04-30 MED ORDER — 0.9 % SODIUM CHLORIDE (POUR BTL) OPTIME
TOPICAL | Status: DC | PRN
  Administered 2021-04-30 (×3): 1000 mL

## 2021-04-30 MED ORDER — ROCURONIUM BROMIDE 10 MG/ML (PF) SYRINGE
PREFILLED_SYRINGE | INTRAVENOUS | Status: AC
  Filled 2021-04-30: qty 10

## 2021-04-30 MED ORDER — LABETALOL HCL 5 MG/ML IV SOLN: 10.0000 mg | INTRAVENOUS | Status: DC | PRN

## 2021-04-30 MED ORDER — SUFENTANIL CITRATE 50 MCG/ML IV SOLN
INTRAVENOUS | Status: AC
  Filled 2021-04-30: qty 1

## 2021-04-30 MED ORDER — PHENYLEPHRINE HCL-NACL 20-0.9 MG/250ML-% IV SOLN
INTRAVENOUS | Status: DC | PRN
  Administered 2021-04-30: 25 ug/min via INTRAVENOUS

## 2021-04-30 MED ORDER — MIDAZOLAM HCL 5 MG/5ML IJ SOLN
INTRAMUSCULAR | Status: DC | PRN
  Administered 2021-04-30 (×2): 1 mg via INTRAVENOUS

## 2021-04-30 MED ORDER — ESMOLOL HCL 100 MG/10ML IV SOLN
INTRAVENOUS | Status: DC | PRN
  Administered 2021-04-30 (×2): 50 mg via INTRAVENOUS

## 2021-04-30 MED ORDER — PROPOFOL 10 MG/ML IV BOLUS
INTRAVENOUS | Status: DC | PRN
  Administered 2021-04-30: 50 mg via INTRAVENOUS
  Administered 2021-04-30: 30 mg via INTRAVENOUS
  Administered 2021-04-30: 120 mg via INTRAVENOUS

## 2021-04-30 MED ORDER — CHLORHEXIDINE GLUCONATE 0.12 % MT SOLN: 15.0000 mL | Freq: Once | OROMUCOSAL | Status: AC

## 2021-04-30 MED ORDER — MORPHINE SULFATE (PF) 2 MG/ML IV SOLN
1.0000 mg | INTRAVENOUS | Status: DC | PRN
  Administered 2021-04-30: 2 mg via INTRAVENOUS
  Filled 2021-04-30: qty 1

## 2021-04-30 MED ORDER — ONDANSETRON HCL 4 MG/2ML IJ SOLN
INTRAMUSCULAR | Status: AC
  Filled 2021-04-30: qty 2

## 2021-04-30 MED ORDER — ONDANSETRON HCL 4 MG PO TABS: 4.0000 mg | ORAL_TABLET | ORAL | Status: DC | PRN

## 2021-04-30 MED ORDER — LACTATED RINGERS IV SOLN: INTRAVENOUS | Status: DC

## 2021-04-30 MED ORDER — HEMOSTATIC AGENTS (NO CHARGE) OPTIME
TOPICAL | Status: DC | PRN
  Administered 2021-04-30: 1 via TOPICAL

## 2021-04-30 MED ORDER — DEXAMETHASONE SODIUM PHOSPHATE 10 MG/ML IJ SOLN
6.0000 mg | Freq: Four times a day (QID) | INTRAMUSCULAR | Status: AC
Start: 2021-04-30 — End: 2021-05-01
  Administered 2021-04-30 – 2021-05-01 (×4): 6 mg via INTRAVENOUS
  Filled 2021-04-30 (×4): qty 1

## 2021-04-30 MED ORDER — SODIUM CHLORIDE 0.9 % IV SOLN: 10.0000 mL/h | Freq: Once | INTRAVENOUS | Status: AC

## 2021-04-30 MED ORDER — ONDANSETRON HCL 4 MG/2ML IJ SOLN: 4.0000 mg | Freq: Once | INTRAMUSCULAR | Status: DC | PRN

## 2021-04-30 MED ORDER — FLUTICASONE PROPIONATE 50 MCG/ACT NA SUSP
2.0000 | Freq: Every day | NASAL | Status: DC
  Administered 2021-05-01 – 2021-05-02 (×2): 2 via NASAL
  Filled 2021-04-30: qty 16

## 2021-04-30 MED ORDER — CHLORHEXIDINE GLUCONATE 0.12 % MT SOLN
OROMUCOSAL | Status: AC
  Administered 2021-04-30: 15 mL via OROMUCOSAL
  Filled 2021-04-30: qty 15

## 2021-04-30 MED ORDER — DEXAMETHASONE SODIUM PHOSPHATE 4 MG/ML IJ SOLN: 4.0000 mg | Freq: Three times a day (TID) | INTRAMUSCULAR | Status: DC

## 2021-04-30 MED ORDER — BACITRACIN ZINC 500 UNIT/GM EX OINT
TOPICAL_OINTMENT | CUTANEOUS | Status: AC
  Filled 2021-04-30: qty 28.35

## 2021-04-30 MED ORDER — LIDOCAINE 2% (20 MG/ML) 5 ML SYRINGE
INTRAMUSCULAR | Status: DC | PRN
Start: 2021-04-30 — End: 2021-04-30
  Administered 2021-04-30: 80 mg via INTRAVENOUS

## 2021-04-30 MED ORDER — ACETAMINOPHEN 10 MG/ML IV SOLN
INTRAVENOUS | Status: DC | PRN
  Administered 2021-04-30: 1000 mg via INTRAVENOUS

## 2021-04-30 MED ORDER — LIDOCAINE-EPINEPHRINE 1 %-1:100000 IJ SOLN
INTRAMUSCULAR | Status: AC
  Filled 2021-04-30: qty 1

## 2021-04-30 MED ORDER — DEXAMETHASONE SODIUM PHOSPHATE 10 MG/ML IJ SOLN
INTRAMUSCULAR | Status: DC | PRN
  Administered 2021-04-30: 10 mg via INTRAVENOUS

## 2021-04-30 MED ORDER — DEXAMETHASONE SODIUM PHOSPHATE 4 MG/ML IJ SOLN
4.0000 mg | Freq: Four times a day (QID) | INTRAMUSCULAR | Status: DC
  Administered 2021-05-01: 4 mg via INTRAVENOUS
  Filled 2021-04-30: qty 1

## 2021-04-30 MED ORDER — THROMBIN 5000 UNITS EX SOLR: OROMUCOSAL | Status: DC | PRN

## 2021-04-30 SURGICAL SUPPLY — 70 items
BAG COUNTER SPONGE SURGICOUNT (BAG) ×4 IMPLANT
BAG DECANTER FOR FLEXI CONT (MISCELLANEOUS) ×2 IMPLANT
BAG SPNG CNTER NS LX DISP (BAG) ×4
BAND INSRT 18 STRL LF DISP RB (MISCELLANEOUS) ×4
BAND RUBBER #18 3X1/16 STRL (MISCELLANEOUS) ×2 IMPLANT
BLADE CLIPPER SURG (BLADE) ×2 IMPLANT
BNDG COHESIVE 4X5 TAN STRL (GAUZE/BANDAGES/DRESSINGS) ×2 IMPLANT
BUR ACORN 6.0 PRECISION (BURR) ×3 IMPLANT
BUR SPIRAL ROUTER 2.3 (BUR) ×1 IMPLANT
CANISTER SUCT 3000ML PPV (MISCELLANEOUS) ×6 IMPLANT
CARTRIDGE OIL MAESTRO DRILL (MISCELLANEOUS) ×2 IMPLANT
CLIP VESOCCLUDE MED 6/CT (CLIP) ×2 IMPLANT
CNTNR URN SCR LID CUP LEK RST (MISCELLANEOUS) ×2 IMPLANT
CONT SPEC 4OZ STRL OR WHT (MISCELLANEOUS) ×6
COVER BURR HOLE 7 (Orthopedic Implant) ×1 IMPLANT
COVER BURR HOLE UNIV 10 (Orthopedic Implant) ×2 IMPLANT
DIFFUSER DRILL AIR PNEUMATIC (MISCELLANEOUS) ×3 IMPLANT
DRAPE CAMERA VIDEO/LASER (DRAPES) IMPLANT
DRAPE MICROSCOPE LEICA (MISCELLANEOUS) ×3 IMPLANT
DRAPE NEUROLOGICAL W/INCISE (DRAPES) ×3 IMPLANT
DRAPE STERI IOBAN 125X83 (DRAPES) IMPLANT
DRAPE SURG 17X23 STRL (DRAPES) IMPLANT
DRAPE WARM FLUID 44X44 (DRAPES) ×3 IMPLANT
ELECT CAUTERY BLADE 6.4 (BLADE) ×2 IMPLANT
ELECT REM PT RETURN 9FT ADLT (ELECTROSURGICAL) ×3
ELECTRODE REM PT RTRN 9FT ADLT (ELECTROSURGICAL) ×2 IMPLANT
FORCEPS BIPOLAR SPETZLER 8 1.0 (NEUROSURGERY SUPPLIES) ×1 IMPLANT
GAUZE 4X4 16PLY ~~LOC~~+RFID DBL (SPONGE) ×2 IMPLANT
GAUZE SPONGE 4X4 12PLY STRL (GAUZE/BANDAGES/DRESSINGS) ×2 IMPLANT
GAUZE SPONGE 4X4 12PLY STRL LF (GAUZE/BANDAGES/DRESSINGS) ×1 IMPLANT
GLOVE EXAM NITRILE XL STR (GLOVE) IMPLANT
GLOVE SURG LTX SZ9 (GLOVE) ×4 IMPLANT
GOWN STRL REUS W/ TWL LRG LVL3 (GOWN DISPOSABLE) IMPLANT
GOWN STRL REUS W/ TWL XL LVL3 (GOWN DISPOSABLE) IMPLANT
GOWN STRL REUS W/TWL 2XL LVL3 (GOWN DISPOSABLE) IMPLANT
GOWN STRL REUS W/TWL LRG LVL3 (GOWN DISPOSABLE) ×3
GOWN STRL REUS W/TWL XL LVL3 (GOWN DISPOSABLE) ×12
GRAFT DURAGEN MATRIX 2WX2L ×1 IMPLANT
HEMOSTAT POWDER KIT SURGIFOAM (HEMOSTASIS) ×2 IMPLANT
HEMOSTAT SURGICEL 2X14 (HEMOSTASIS) ×3 IMPLANT
KIT BASIN OR (CUSTOM PROCEDURE TRAY) ×3 IMPLANT
KIT TURNOVER KIT B (KITS) ×3 IMPLANT
MARKER SPHERE PSV REFLC 13MM (MARKER) ×6 IMPLANT
NDL HYPO 18GX1.5 BLUNT FILL (NEEDLE) IMPLANT
NDL HYPO 25X1 1.5 SAFETY (NEEDLE) ×2 IMPLANT
NEEDLE HYPO 18GX1.5 BLUNT FILL (NEEDLE) IMPLANT
NEEDLE HYPO 25X1 1.5 SAFETY (NEEDLE) ×3 IMPLANT
NS IRRIG 1000ML POUR BTL (IV SOLUTION) ×7 IMPLANT
OIL CARTRIDGE MAESTRO DRILL (MISCELLANEOUS) ×3
PACK CRANIOTOMY CUSTOM (CUSTOM PROCEDURE TRAY) ×3 IMPLANT
PAD ARMBOARD 7.5X6 YLW CONV (MISCELLANEOUS) ×9 IMPLANT
PATTIES SURGICAL .25X.25 (GAUZE/BANDAGES/DRESSINGS) IMPLANT
PATTIES SURGICAL .5 X.5 (GAUZE/BANDAGES/DRESSINGS) IMPLANT
PATTIES SURGICAL .5 X3 (DISPOSABLE) IMPLANT
PATTIES SURGICAL 1X1 (DISPOSABLE) IMPLANT
PLATE CRANIAL 12 2H RIGID UNI (Plate) ×1 IMPLANT
PLATE CRANIAL 20 W/TAB F/SHN (Plate) ×1 IMPLANT
SCREW UNIII AXS SD 1.5X4 (Screw) ×18 IMPLANT
SPONGE NEURO XRAY DETECT 1X3 (DISPOSABLE) IMPLANT
SPONGE SURGIFOAM ABS GEL 100 (HEMOSTASIS) ×4 IMPLANT
STAPLER VISISTAT 35W (STAPLE) ×3 IMPLANT
STOCKINETTE TUBULAR 6 INCH (GAUZE/BANDAGES/DRESSINGS) ×1 IMPLANT
SUT NURALON 4 0 TR CR/8 (SUTURE) ×8 IMPLANT
SUT VIC AB 2-0 CT2 18 VCP726D (SUTURE) ×6 IMPLANT
SYR CONTROL 10ML LL (SYRINGE) ×2 IMPLANT
TOWEL GREEN STERILE (TOWEL DISPOSABLE) ×3 IMPLANT
TOWEL GREEN STERILE FF (TOWEL DISPOSABLE) ×3 IMPLANT
TRAY FOLEY MTR SLVR 16FR STAT (SET/KITS/TRAYS/PACK) ×3 IMPLANT
UNDERPAD 30X36 HEAVY ABSORB (UNDERPADS AND DIAPERS) ×3 IMPLANT
WATER STERILE IRR 1000ML POUR (IV SOLUTION) ×3 IMPLANT

## 2021-04-30 NOTE — Op Note (Signed)
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  Surgeon:Dalayla Aldredge A.Marques Ericson, M.D.  Asst. Surgeon: Marcello Moores, MD; Reinaldo Meeker, NP  Anesthesia: General  Indication: 75 year old female who presents with a right posterior frontal brain mass worrisome for metastasis with seizures and weakness.  Patient with surveillance CT scans which demonstrate a lung mass worrisome for lung carcinoma.  Patient presents now for craniotomy and resection of what is felt to be a solitary metastasis.  Operative note: After induction of anesthesia, patient position supine with her neck flexed and her head held in the [place] pin headrest.  Stereotactic registration was then performed utilizing the [place] navigation platform.  Scalp was prepped and draped.  Incision made by parietally.  Self-retaining tractor was placed.  Bur holes were then made in the parietal bone on both sides of the sagittal suture.  The dura was obviously a very poor quality and was difficult to separate from the overlying bone.  Careful dissection allowed safe passage of the craniotome over the top of the superior sagittal sinus.  Craniotomy flap was then performed using high-speed drill.  Bone flap was elevated.  Scattered areas of sinus bleeding were controlled with Gelfoam.  The dura was opened and hinged along the midline.  There were number of large draining veins heading toward the midline overlying the targeted brain.  Using the stereotactic navigation I determined the best site for a small cortical incision hopefully anterior to her precentral gyrus.  I carried this down into small amount of hemorrhage cavity with some liquefied blood.  There  was obvious tumor.  Microscope was used for microdissection.  I resected the tumor using gentle suction and forceps.  Tumor was collected for pathologic evaluation and sent for permanent studies.  Bleeding points were controlled with minimal bipolar electrocautery and Surgifoam.  A gross total resection was achieved.  The wound was copiously irrigated.  No areas of bleeding or obvious infarction were noted.  The cortical incisions were lined with Surgicel.  The dura was laid over the cortex and a piece of DuraGen was placed over the craniotomy defect.  Skull was reapproximated using cranial plates.  Scalp was reapproximated using 2-0 Vicryl sutures at the galea and staples at the surface.  No apparent complications.  Patient tolerated procedure well and she returns to the recovery room postop. 04/30/2021

## 2021-04-30 NOTE — Brief Op Note (Signed)
04/24/2021 - 04/30/2021  10:03 AM  PATIENT:  Yen Albergo  75 y.o. female  PRE-OPERATIVE DIAGNOSIS:  Brain tumor  POST-OPERATIVE DIAGNOSIS:  Brain tumor  PROCEDURE:  Procedure(s): Right Parietal Craniotomy for tumor with Brain Lab (Right) APPLICATION OF CRANIAL NAVIGATION (Right)  SURGEON:  Surgeon(s) and Role:    Earnie Larsson, MD - Primary    * Vallarie Mare, MD - Assisting  PHYSICIAN ASSISTANT:   ASSISTANTSMearl Latin   ANESTHESIA:   general  EBL:  250 mL   BLOOD ADMINISTERED:none  DRAINS: none   LOCAL MEDICATIONS USED:  LIDOCAINE   SPECIMEN: Right posterior frontal mass  DISPOSITION OF SPECIMEN:  PATHOLOGY  COUNTS:  YES  TOURNIQUET:  * No tourniquets in log *  DICTATION: .Dragon Dictation  PLAN OF CARE: Admit to inpatient   PATIENT DISPOSITION:  PACU - hemodynamically stable.   Delay start of Pharmacological VTE agent (>24hrs) due to surgical blood loss or risk of bleeding: yes

## 2021-04-30 NOTE — Progress Notes (Signed)
PROGRESS NOTE    Tara Murphy  IRW:[id number] DOB: [birth year] DOA: 04/24/2021 PCP: Leamon Arnt, MD   Brief Narrative:  Tara Murphy is a 75 y.o. WF PMHx chronic anxiety/depression, LEFT breast cancer status postlumpectomy in 2015, left foot drop, EMG diagnosis of peripheral neuropathy, HLD, GERD, , herpes zoster, former smoker, restless leg syndrome, IBS,   Presented to [facility] ED from home after having a seizure like activity, likely focal with left lower and left upper extremity uncoordinated uncontrolled jerky movements, witnessed by her husband who is at bedside, accompanied by their daughter.   CT head shows a brain tumor 2 x 2 cm hemorrhagic posterior right frontal lobe mass and associated vasogenic edema.  She was started on Keppra, CT chest/abdomen/pelvis significant for finding of left upper lung spiculated mass suspicious for primary malignancy.   04/28/2021: Patient seen alongside patient's husband and nurse.  Neurosurgery input is appreciated.  For surgery on Monday.  Above documentation is noted.  Hopefully, the brain mass pathology will reveal primary source, if not, we will need to consult pulmonary team for possible endobronchial biopsy of the lung mass.   04/29/2021: Patient seen alongside patient's husband and stepdaughter.  No new changes.  For surgery tomorrow.   Subjective: Afebrile overnight s/p bilateral partial craniotomy.  A/O x4, patient talking on the phone to family members.  Only complaint is some pain from surgical site.   Assessment & Plan:  Covid vaccination; vaccinated 3/4  Active Problems:   GERD (gastroesophageal reflux disease)   Seizure ([place])   Brain mass   New seizure activity in the setting of newly diagnosed brain metastasis. -Neurosurgery/neurology input greatly appreciated. -Seizure medications per neurology, currently on Keppra, being loaded with Dilantin, she is on long-term EEG. -NeuroSurgery input greatly appreciated, infarct craniotomy and  resection of tumor on Monday by Dr. Trenton Gammon.   -Continue with IV steroids 04/28/2021: No further seizures noted.  Continue to monitor closely.  Seizure precaution.  Patient is on IV Keppra and IV steroids. 04/29/2021: No seizures reported.   Lung cancer with brain metastasis -New finding, oncology consulted, they will await results after brain surgery regarding commendation for further treatment. 04/28/2021: Imaging studies have revealed spiculated right lung mass and brain mass.  For resection of the brain mass on Monday.  Hopefully, brain mass will reveal primary pathology, if not, will consider pulmonary consult for possible endobronchial biopsy.    Elevated BP -IV of IV labetalol as needed H6 22: Blood pressure is better controlled.   Chronic anxiety/depression On Buspar prior to admission -she was started on as needed Xanax. -04/28/2021: We will avoid long-term Xanax use.   Ambulatory dysfunction T/OT consulted   DVT prophylaxis: SCD Code Status: Full Family Communication:  Status is: Inpatient    Dispo: The patient is from: Home              Anticipated d/c is to: SNF vs home              Anticipated d/c date is: > 3 days              Patient currently is not medically stable to d/c.      Consultants:  Neurosurgery   Procedures/Significant Events:  8/8 Bilateral parietal craniotomy with resection of brain tumor, microdissection, intraoperative stereotactic navigation for  volumetric resection   I have personally reviewed and interpreted all radiology studies and my findings are as above.  VENTILATOR SETTINGS:    Cultures   Antimicrobials:  Devices    LINES / TUBES:      Continuous Infusions:  sodium chloride 75 mL/hr at 04/30/21 1300    ceFAZolin (ANCEF) IV     levETIRAcetam 400 mL/hr at 04/30/21 1300     Objective: Vitals:   04/30/21 1106 04/30/21 1121 04/30/21 1136 04/30/21 1300  BP: (!) 135/59 (!) 135/54 (!) 137/54 131/62  Pulse: 86 83 81  78  Resp: (!) 25 (!) 22 17 (!) 21  Temp:   (!) 97.2 F (36.2 C)   TempSrc:      SpO2: 97% 96% 95% 95%  Weight:      Height:        Intake/Output Summary (Last 24 hours) at 04/30/2021 1408 Last data filed at 04/30/2021 1300 Gross per 24 hour  Intake 1797.44 ml  Output 1600 ml  Net 197.44 ml   Filed Weights   04/24/21 1807  Weight: 94.3 kg    Examination:  General: A/O x4, No acute respiratory distress Eyes: negative scleral hemorrhage, negative anisocoria, negative icterus ENT: Negative Runny nose, negative gingival bleeding, Neck:  Negative scars, masses, torticollis, lymphadenopathy, JVD Lungs: Clear to auscultation bilaterally without wheezes or crackles Cardiovascular: Regular rate and rhythm without murmur gallop or rub normal S1 and S2 Abdomen: negative abdominal pain, nondistended, positive soft, bowel sounds, no rebound, no ascites, no appreciable mass Extremities: No significant cyanosis, clubbing, or edema bilateral lower extremities Skin: Negative rashes, lesions, ulcers Psychiatric:  Negative depression, negative anxiety, negative fatigue, negative mania  Central nervous system:  Cranial nerves II through XII intact, tongue/uvula midline, all extremities muscle strength 5/5, except LEFT upper extremity strength 4/5, LEFT lower extremity strength 3/5, sensation intact throughout, negative dysarthria, negative expressive aphasia, negative receptive aphasia.  .     Data Reviewed: Care during the described time interval was provided by me .  I have reviewed this patient's available data, including medical history, events of note, physical examination, and all test results as part of my evaluation.   CBC: Recent Labs  Lab 04/24/21 1758 04/25/21 0401 04/26/21 0442 04/27/21 0341 04/29/21 1442 04/30/21 0936  WBC 9.8 6.3 13.2* 11.2* 11.1*  --   NEUTROABS 7.9*  --   --   --   --   --   HGB 14.1 14.0 13.0 12.6 14.7 13.9  HCT 44.7 43.0 39.0 37.8 43.4 41.0  MCV 94.9  93.9 91.5 91.7 90.0  --   PLT 364 319 347 337 354  --    Basic Metabolic Panel: Recent Labs  Lab 04/24/21 1758 04/25/21 0401 04/26/21 0442 04/27/21 0341 04/30/21 0356 04/30/21 0936  NA 139 139 138 138 135 138  K 3.9 3.9 4.0 4.3 5.3* 4.3  CL 107 107 104 107 105  --   CO2 22 22 23 24  19*  --   GLUCOSE 103* 165* 148* 144* 140*  --   BUN 15 11 15 18 20   --   CREATININE 0.74 0.50 0.61 0.64 0.56  --   CALCIUM 9.4 9.1 9.8 9.0 9.1  --   MG  --  1.9  --   --   --   --   PHOS  --  4.2  --   --  4.4  --    GFR: Estimated Creatinine Clearance: 71.4 mL/min (by C-G formula based on SCr of 0.56 mg/dL). Liver Function Tests: Recent Labs  Lab 04/24/21 1758 04/26/21 0442 04/30/21 0356  AST 37 20  --   ALT 43 34  --  ALKPHOS 85 78  --   BILITOT 0.6 0.5  --   PROT 7.1 6.1*  --   ALBUMIN 4.0 3.4* 3.4*   No results for input(s): LIPASE, AMYLASE in the last 168 hours. No results for input(s): AMMONIA in the last 168 hours. Coagulation Profile: No results for input(s): INR, PROTIME in the last 168 hours. Cardiac Enzymes: No results for input(s): CKTOTAL, CKMB, CKMBINDEX, TROPONINI in the last 168 hours. BNP (last 3 results) No results for input(s): PROBNP in the last 8760 hours. HbA1C: No results for input(s): HGBA1C in the last 72 hours. CBG: Recent Labs  Lab 04/29/21 0639 04/29/21 1236 04/29/21 1656 04/29/21 2151 04/30/21 0636  GLUCAP 117* 98 126* 215* 156*   Lipid Profile: No results for input(s): CHOL, HDL, LDLCALC, TRIG, CHOLHDL, LDLDIRECT in the last 72 hours. Thyroid Function Tests: No results for input(s): TSH, T4TOTAL, FREET4, T3FREE, THYROIDAB in the last 72 hours. Anemia Panel: No results for input(s): VITAMINB12, FOLATE, FERRITIN, TIBC, IRON, RETICCTPCT in the last 72 hours. Urine analysis:    Component Value Date/Time   COLORURINE YELLOW 12/07/2015 1135   APPEARANCEUR CLOUDY (A) 12/07/2015 1135   LABSPEC 1.010 12/07/2015 1135   PHURINE 5.5 12/07/2015 1135    GLUCOSEU NEGATIVE 12/07/2015 1135   HGBUR NEGATIVE 12/07/2015 1135   BILIRUBINUR neg 04/16/2021 1504   KETONESUR NEGATIVE 12/07/2015 1135   PROTEINUR Negative 04/16/2021 1504   PROTEINUR NEGATIVE 12/07/2015 1135   UROBILINOGEN 0.2 (A) 04/16/2021 1504   NITRITE neg 04/16/2021 1504   NITRITE NEGATIVE 12/07/2015 1135   LEUKOCYTESUR Negative 04/16/2021 1504   Sepsis Labs: @LABRCNTIP (procalcitonin:4,lacticidven:4)  ) Recent Results (from the past 240 hour(s))  SARS CORONAVIRUS 2 (TAT 6-24 HRS) Nasopharyngeal Nasopharyngeal Swab     Status: None   Collection Time: 04/25/21  3:36 PM   Specimen: Nasopharyngeal Swab  Result Value Ref Range Status   SARS Coronavirus 2 NEGATIVE NEGATIVE Final    Comment: (NOTE) SARS-CoV-2 target nucleic acids are NOT DETECTED.  The SARS-CoV-2 RNA is generally detectable in upper and lower respiratory specimens during the acute phase of infection. Negative results do not preclude SARS-CoV-2 infection, do not rule out co-infections with other pathogens, and should not be used as the sole basis for treatment or other patient management decisions. Negative results must be combined with clinical observations, patient history, and epidemiological information. The expected result is Negative.  Fact Sheet for Patients: SugarRoll.be  Fact Sheet for Healthcare Providers: https://www.Shanece Cochrane-mathews.com/  This test is not yet approved or cleared by the [place] FDA and  has been authorized for detection and/or diagnosis of SARS-CoV-2 by FDA under an Emergency Use Authorization (EUA). This EUA will remain  in effect (meaning this test can be used) for the duration of the COVID-19 declaration under Se ction 564(b)(1) of the Act, 21 U.S.C. section 360bbb-3(b)(1), unless the authorization is terminated or revoked sooner.  Performed at [facility], [place] [address], [place], [place] [address]   Surgical PCR  screen     Status: None   Collection Time: 04/29/21 10:46 PM   Specimen: Nasal Mucosa; Nasal Swab  Result Value Ref Range Status   MRSA, PCR NEGATIVE NEGATIVE Final   Staphylococcus aureus NEGATIVE NEGATIVE Final    Comment: (NOTE) The Xpert SA Assay (FDA approved for NASAL specimens in patients 99 years of age and older), is one component of a comprehensive surveillance program. It is not intended to diagnose infection nor to guide or monitor treatment. Performed at [facility], [place] [address], [place], [place] [address]          Radiology Studies: MR BRAIN W WO CONTRAST  Result Date: 04/28/2021 CLINICAL DATA:  Left lower extremity weakness. Breast carcinoma. Brain lab protocol. EXAM: MRI HEAD WITHOUT AND WITH CONTRAST TECHNIQUE: Multiplanar, multiecho pulse sequences of the brain and surrounding structures were obtained without and with intravenous contrast. CONTRAST:  9.53mL GADAVIST GADOBUTROL 1 MMOL/ML IV SOLN COMPARISON:  Brain MRI 04/24/2021 FINDINGS: Brain: Unchanged appearance of hemorrhagic lesion in the paramedian right frontal lobe with surrounding edema. Blood fluid level within the lesion. No midline shift. Vascular: Major flow voids are preserved. Skull and upper cervical spine: Normal calvarium and skull base. Visualized upper cervical spine and soft tissues are normal. Sinuses/Orbits:No paranasal sinus fluid levels or advanced mucosal thickening. No mastoid or middle ear effusion. Normal orbits. IMPRESSION: BrainLAB protocol showing unchanged appearance of hemorrhagic lesion in the paramedian right frontal lobe with surrounding edema. Electronically Signed   By: Ulyses Jarred M.D.   On: 04/28/2021 20:09        Scheduled Meds:  Chlorhexidine Gluconate Cloth  6 each Topical Daily   dexamethasone (DECADRON) injection  6 mg Intravenous Q6H   Followed by   Derrill Memo ON 05/01/2021] dexamethasone (DECADRON) injection  4 mg Intravenous Q6H   Followed by   Derrill Memo ON  05/02/2021] dexamethasone (DECADRON) injection  4 mg Intravenous Q8H   diphenhydrAMINE  50 mg Oral Once   Or   diphenhydrAMINE  50 mg Intravenous Once   fluticasone  2 spray Each Nare Daily   gabapentin  600 mg Oral BID   gabapentin  800 mg Oral Once   HYDROmorphone       insulin aspart  0-5 Units Subcutaneous QHS   insulin aspart  0-9 Units Subcutaneous TID WC   mupirocin ointment  1 application Nasal BID   pantoprazole (PROTONIX) IV  40 mg Intravenous Q24H   rOPINIRole  4 mg Oral QHS   Continuous Infusions:  sodium chloride 75 mL/hr at 04/30/21 1300    ceFAZolin (ANCEF) IV     levETIRAcetam 400 mL/hr at 04/30/21 1300     LOS: 6 days   The patient is critically ill with multiple organ systems failure and requires high complexity decision making for assessment and support, frequent evaluation and titration of therapies, application of advanced monitoring technologies and extensive interpretation of multiple databases. Critical Care Time devoted to patient care services described in this note  Time spent: 40 minutes     Shayleigh Bouldin, Geraldo Docker, MD [facility]   If 7PM-7AM, please contact night-coverage 04/30/2021, 2:08 PM

## 2021-04-30 NOTE — Progress Notes (Addendum)
Subjective: Status post surgery today.  Complaining of some headache.  Denies any seizures.  ROS: negative except above  Examination  Vital signs in last 24 hours: Temp:  [97.2 F (36.2 C)-98.7 F (37.1 C)] 97.2 F (36.2 C) (08/08 1136) Pulse Rate:  [57-88] 79 (08/08 1400) Resp:  [14-25] 16 (08/08 1400) BP: (113-155)/(52-74) 138/66 (08/08 1400) SpO2:  [93 %-97 %] 93 % (08/08 1400) Arterial Line BP: (158-175)/(49-66) 165/64 (08/08 1400)  General: lying in bed, NAD CVS: pulse-normal rate and rhythm RS: breathing comfortably, CTAB Extremities: normal , warm  Neuro: MS: Alert, oriented, follows commands CN: pupils equal and reactive,  EOMI, face symmetric, tongue midline, normal sensation over face, Motor: 5/5 strength in RUE/RLE, 4/5 in LUE, 3/5 in LLE  Basic Metabolic Panel: Recent Labs  Lab 04/24/21 1758 04/25/21 0401 04/26/21 0442 04/27/21 0341 04/30/21 0356 04/30/21 0936  NA 139 139 138 138 135 138  K 3.9 3.9 4.0 4.3 5.3* 4.3  CL 107 107 104 107 105  --   CO2 _0 19*  --   GLUCOSE 103* 165* 148* 144* 140*  --   BUN _1 --   CREATININE 0.74 0.50 0.61 0.64 0.56  --   CALCIUM 9.4 9.1 9.8 9.0 9.1  --   MG  --  1.9  --   --   --   --   PHOS  --  4.2  --   --  4.4  --     CBC: Recent Labs  Lab 04/24/21 1758 04/25/21 0401 04/26/21 0442 04/27/21 0341 04/29/21 1442 04/30/21 0936  WBC 9.8 6.3 13.2* 11.2* 11.1*  --   NEUTROABS 7.9*  --   --   --   --   --   HGB 14.1 14.0 13.0 12.6 14.7 13.9  HCT 44.7 43.0 39.0 37.8 43.4 41.0  MCV 94.9 93.9 91.5 91.7 90.0  --   PLT 364 319 347 337 354  --      Coagulation Studies: No results for input(s): LABPROT, INR in the last 72 hours.  Imaging MRI brain with and without contrast 04/28/2021: BrainLAB protocol showing unchanged appearance of hemorrhagic lesion in the paramedian right frontal lobe with surrounding edema.  ASSESSMENT AND PLAN: 75 year old right-handed female with history of left breast  cancer status postlumpectomy, former smoker who presented on 04/24/2021 with left upper and lower extremity jerking.  MRI brain with and without contrast showed hemorrhagic lesion in the paramedian superior right frontal lobe with moderate surrounding vasogenic edema consistent with solitary met.  CT chest showed large spiculated left upper lobe suprahilar mass consistent most likely with primary bronchogenic malignancy.   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  IV Dilantin load was given on 04/26/2021.  Has not had any further seizures since.   Recommendations -Continue Keppra 1000 mg twice daily. -If seizures recur, depending on seizure frequency and duration, will consider loading with phenytoin and starting maintenance versus increasing maintenance dose of Keppra  -Also on gabapentin 600 mg in the morning and afternoon and 800 mg at bedtime -Continue seizure precautions -As needed IV Ativan 2 mg for clinical seizure-like activity lasting more than 5 minutes -Management of rest of comorbidities per primary team  Thank you for allowing [name] to participate in the care of this patient.  Neurology will  sign off.  Please call us for any further questions.   I have spent a total of 25 minutes with the patient reviewing hospital notes,  test results, labs and examining the patient as well as establishing an assessment and plan that was discussed personally with the patient.  > 50% of time was spent in direct patient care.   Zeb Comfort Epilepsy [facility] For questions after 5pm please refer to AMION to reach the Neurologist on call

## 2021-04-30 NOTE — Interval H&P Note (Signed)
History and Physical Interval Note:  04/30/2021 7:54 AM  Tara Murphy  has presented today for surgery, with the diagnosis of Brain tumor.  The various methods of treatment have been discussed with the patient and family. After consideration of risks, benefits and other options for treatment, the patient has consented to  Procedure(s): Right Parietal Crani for tumor w/Brain Lab (Right) APPLICATION OF CRANIAL NAVIGATION (Right) as a surgical intervention.  The patient's history has been reviewed, patient examined, no change in status, stable for surgery.  I have reviewed the patient's chart and labs.  Questions were answered to the patient's satisfaction.     Cooper Render Javoris Star

## 2021-04-30 NOTE — Anesthesia Postprocedure Evaluation (Signed)
Anesthesia Post Note  Patient: [name]  Procedure(s) Performed: Right Parietal Craniotomy for tumor with Brain Lab (Right: Head) APPLICATION OF CRANIAL NAVIGATION (Right)     Patient location during evaluation: PACU Anesthesia Type: General Level of consciousness: awake and alert Pain management: pain level controlled Vital Signs Assessment: post-procedure vital signs reviewed and stable Respiratory status: spontaneous breathing, nonlabored ventilation, respiratory function stable and patient connected to nasal cannula oxygen Cardiovascular status: blood pressure returned to baseline and stable Postop Assessment: no apparent nausea or vomiting Anesthetic complications: no   No notable events documented.  Last Vitals:  Vitals:   04/30/21 1121 04/30/21 1136  BP: (!) 135/54 (!) 137/54  Pulse: 83 81  Resp: (!) 22 17  Temp:  (!) 36.2 C  SpO2: 96% 95%    Last Pain:  Vitals:   04/30/21 1136  TempSrc:   PainSc: 2                  Merlinda Frederick

## 2021-04-30 NOTE — Anesthesia Procedure Notes (Signed)
Procedure Name: Intubation Date/Time: 04/30/2021 8:19 AM Performed by: Renato Shin, CRNA Pre-anesthesia Checklist: Emergency Drugs available, Patient identified, Suction available and Patient being monitored Patient Re-evaluated:Patient Re-evaluated prior to induction Oxygen Delivery Method: Circle system utilized Preoxygenation: Pre-oxygenation with 100% oxygen Induction Type: IV induction Ventilation: Mask ventilation without difficulty Laryngoscope Size: Miller and 2 Grade View: Grade II Tube type: Oral Tube size: 7.5 mm Number of attempts: 2 Airway Equipment and Method: Stylet Placement Confirmation: ETT inserted through vocal cords under direct vision, positive ETCO2 and breath sounds checked- equal and bilateral Secured at: 20 cm Tube secured with: Tape Dental Injury: Teeth and Oropharynx as per pre-operative assessment

## 2021-04-30 NOTE — Plan of Care (Signed)

## 2021-04-30 NOTE — Transfer of Care (Signed)
Immediate Anesthesia Transfer of Care Note  Patient: Tara Murphy  Procedure(s) Performed: Right Parietal Craniotomy for tumor with Brain Lab (Right: Head) APPLICATION OF CRANIAL NAVIGATION (Right)  Patient Location: PACU  Anesthesia Type:General  Level of Consciousness: awake and patient cooperative  Airway & Oxygen Therapy: Patient Spontanous Breathing  Post-op Assessment: Report given to RN, Post -op Vital signs reviewed and stable, Patient moving all extremities X 4 and Patient able to stick tongue midline  Post vital signs: Reviewed and stable  Last Vitals:  Vitals Value Taken Time  BP 141/72 04/30/21 1021  Temp    Pulse 83 04/30/21 1026  Resp 15 04/30/21 1026  SpO2 96 % 04/30/21 1026  Vitals shown include unvalidated device data.  Last Pain:  Vitals:   04/30/21 0400  TempSrc:   PainSc: 0-No pain         Complications: No notable events documented.

## 2021-04-30 NOTE — Anesthesia Procedure Notes (Signed)
Arterial Line Insertion Start/End8/04/2021 7:40 AM, 04/30/2021 7:44 AM Performed by: CRNA  Patient location: OOR procedure area. Preanesthetic checklist: patient identified, IV checked, site marked, risks and benefits discussed, surgical consent, monitors and equipment checked, pre-op evaluation, timeout performed and anesthesia consent Lidocaine 1% used for infiltration Left, radial was placed Catheter size: 20 G Hand hygiene performed , maximum sterile barriers used  and Seldinger technique used Allen's test indicative of satisfactory collateral circulation Attempts: 1 Procedure performed without using ultrasound guided technique. Following insertion, dressing applied and Biopatch. Post procedure assessment: normal  Patient tolerated the procedure well with no immediate complications.

## 2021-05-01 ENCOUNTER — Ambulatory Visit (HOSPITAL_BASED_OUTPATIENT_CLINIC_OR_DEPARTMENT_OTHER): Payer: Medicare Other | Admitting: Physical Therapy

## 2021-05-01 ENCOUNTER — Inpatient Hospital Stay (HOSPITAL_COMMUNITY): Payer: Medicare Other

## 2021-05-01 ENCOUNTER — Encounter: Payer: Self-pay | Admitting: *Deleted

## 2021-05-01 ENCOUNTER — Encounter (HOSPITAL_COMMUNITY): Payer: Self-pay | Admitting: Neurosurgery

## 2021-05-01 DIAGNOSIS — D72829 Elevated white blood cell count, unspecified: Secondary | ICD-10-CM

## 2021-05-01 LAB — CBC WITH DIFFERENTIAL/PLATELET
Abs Immature Granulocytes: 0.12 10*3/uL — ABNORMAL HIGH (ref 0.00–0.07)
Basophils Absolute: 0 10*3/uL (ref 0.0–0.1)
Basophils Relative: 0 %
Eosinophils Absolute: 0 10*3/uL (ref 0.0–0.5)
Eosinophils Relative: 0 %
HCT: 39.2 % (ref 36.0–46.0)
Hemoglobin: 13.1 g/dL (ref 12.0–15.0)
Immature Granulocytes: 1 %
Lymphocytes Relative: 7 %
Lymphs Abs: 1.2 10*3/uL (ref 0.7–4.0)
MCH: 30.8 pg (ref 26.0–34.0)
MCHC: 33.4 g/dL (ref 30.0–36.0)
MCV: 92 fL (ref 80.0–100.0)
Monocytes Absolute: 1.1 10*3/uL — ABNORMAL HIGH (ref 0.1–1.0)
Monocytes Relative: 7 %
Neutro Abs: 14.9 10*3/uL — ABNORMAL HIGH (ref 1.7–7.7)
Neutrophils Relative %: 85 %
Platelets: 313 10*3/uL (ref 150–400)
RBC: 4.26 MIL/uL (ref 3.87–5.11)
RDW: 12.5 % (ref 11.5–15.5)
WBC: 17.3 10*3/uL — ABNORMAL HIGH (ref 4.0–10.5)
nRBC: 0 % (ref 0.0–0.2)

## 2021-05-01 LAB — GLUCOSE, CAPILLARY
Glucose-Capillary: 123 mg/dL — ABNORMAL HIGH (ref 70–99)
Glucose-Capillary: 116 mg/dL — ABNORMAL HIGH (ref 70–99)
Glucose-Capillary: 179 mg/dL — ABNORMAL HIGH (ref 70–99)

## 2021-05-01 LAB — COMPREHENSIVE METABOLIC PANEL
ALT: 97 U/L — ABNORMAL HIGH (ref 0–44)
AST: 42 U/L — ABNORMAL HIGH (ref 15–41)
Albumin: 3 g/dL — ABNORMAL LOW (ref 3.5–5.0)
Alkaline Phosphatase: 79 U/L (ref 38–126)
Anion gap: 4 — ABNORMAL LOW (ref 5–15)
BUN: 18 mg/dL (ref 8–23)
CO2: 24 mmol/L (ref 22–32)
Calcium: 8.4 mg/dL — ABNORMAL LOW (ref 8.9–10.3)
Chloride: 108 mmol/L (ref 98–111)
Creatinine, Ser: 0.53 mg/dL (ref 0.44–1.00)
GFR, Estimated: >60 mL/min (ref >60)
Glucose, Bld: 118 mg/dL — ABNORMAL HIGH (ref 70–99)
Potassium: 4.6 mmol/L (ref 3.5–5.1)
Sodium: 136 mmol/L (ref 135–145)
Total Bilirubin: 0.4 mg/dL (ref 0.3–1.2)
Total Protein: 5.5 g/dL — ABNORMAL LOW (ref 6.5–8.1)

## 2021-05-01 LAB — PHOSPHORUS: Phosphorus: 3.7 mg/dL (ref 2.5–4.6)

## 2021-05-01 LAB — MAGNESIUM: Magnesium: 2 mg/dL (ref 1.7–2.4)

## 2021-05-01 MED ORDER — LEVETIRACETAM 500 MG PO TABS
1000.0000 mg | ORAL_TABLET | Freq: Two times a day (BID) | ORAL | Status: DC
  Administered 2021-05-01 – 2021-05-03 (×4): 1000 mg via ORAL
  Filled 2021-05-01 (×4): qty 2

## 2021-05-01 MED ORDER — DEXAMETHASONE 4 MG PO TABS
2.0000 mg | ORAL_TABLET | Freq: Two times a day (BID) | ORAL | Status: DC
  Administered 2021-05-01 – 2021-05-03 (×4): 2 mg via ORAL
  Filled 2021-05-01 (×4): qty 1

## 2021-05-01 MED ORDER — BUSPIRONE HCL 5 MG PO TABS: 7.5000 mg | ORAL_TABLET | Freq: Two times a day (BID) | ORAL | Status: DC | PRN

## 2021-05-01 MED ORDER — GADOBUTROL 1 MMOL/ML IV SOLN
9.0000 mL | Freq: Once | INTRAVENOUS | Status: AC | PRN
  Administered 2021-05-01: 9 mL via INTRAVENOUS

## 2021-05-01 MED FILL — Thrombin For Soln 5000 Unit: CUTANEOUS | Qty: 5000 | Status: AC

## 2021-05-01 MED FILL — Thrombin For Soln 20000 Unit: CUTANEOUS | Qty: 1 | Status: AC

## 2021-05-01 NOTE — Progress Notes (Signed)
Occupational Therapy RE -Evaluation Patient Details Name: Tara Murphy MRN: [id number] DOB: September 13, 1946 Today's Date: 05/01/2021    History of Present Illness 75 y.o. female presents to [facility] ED on 04/24/2021 after seizure-like activity (L sided jerking movements). 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 S/p bilateral parietal craniotomy with resection of brain tumor 04/30/2021.PMH includes left breast cancer status postlumpectomy in 2015, left foot drop, EMG diagnosis of peripheral neuropathy, hyperlipidemia, GERD, chronic anxiety/depression, herpes zoster, former smoker, restless leg syndrome, IBS.   Clinical Impression   Pt demonstrates ability to kick LLE out into knee extension, pt able to tolerate figure 4 cross but requires (A) to place into figure 4 supine with hOB elevated. Pt able to verbalize needs very clearly with awareness to fall risk. Pt with narrowed base of support and turning L LE outward when attempting shoulder width base of support. Pt has prafo but question need to modify going forward for positioning. Recommendation CIR at this time.   Follow Up Recommendations  CIR    Equipment Recommendations  3 in 1 bedside commode;Tub/shower seat;Wheelchair (measurements OT);Wheelchair cushion (measurements OT)    Recommendations for Other Services Rehab consult     Precautions / Restrictions Precautions Precautions: Fall;Other (comment) Precaution Comments: seizure, L foot drop Required Braces or Orthoses: Other Brace Other Brace: AFO in room Restrictions Weight Bearing Restrictions: No      Mobility Bed Mobility Overal bed mobility: Needs Assistance Bed Mobility: Supine to Sit     Supine to sit: Min assist     General bed mobility comments: Initiating well, use of bed pad to scoot hip anteriorly to edge of bed    Transfers Overall transfer level: Needs assistance Equipment used: Rolling walker (2  wheeled) Transfers: Sit to/from Stand Sit to Stand: Mod assist;+2 physical assistance         General transfer comment: Up to modA + 2 to rise to stand, pt with L foot placed slightly forward, cues provided for hand placement with transition to sitting    Balance Overall balance assessment: Needs assistance Sitting-balance support: Feet supported Sitting balance-Leahy Scale: Good     Standing balance support: Bilateral upper extremity supported Standing balance-Leahy Scale: Poor Standing balance comment: reliant on RW                           ADL either performed or assessed with clinical judgement   ADL Overall ADL's : Needs assistance/impaired Eating/Feeding: Set up;Sitting                   Lower Body Dressing: Moderate assistance;Sit to/from stand Lower Body Dressing Details (indicate cue type and reason): able to figure 4 both sides but needs (A) with L LE. pt able to don doff socks bil LE. pt requires (A) to don L shoe with max (A). Pt uses shoe horn at baseline. p Toilet Transfer: +2 for physical assistance;Ambulation;Minimal assistance;RW Toilet Transfer Details (indicate cue type and reason): simulated with chair and eob         Functional mobility during ADLs: +2 for physical assistance;Moderate assistance       Vision         Perception     Praxis      Pertinent Vitals/Pain Pain Assessment: Faces Faces Pain Scale: Hurts a little bit Pain Location: headache Pain Descriptors / Indicators: Headache Pain Intervention(s): Monitored during session  Hand Dominance Right   Extremity/Trunk Assessment Upper Extremity Assessment Upper Extremity Assessment: Overall WFL for tasks assessed (denies changes) LUE Deficits / Details: 4 / 5 MMT noted   Lower Extremity Assessment Lower Extremity Assessment: Defer to PT evaluation LLE Deficits / Details: Hip flexion 2/5, knee extension 5/5, ankle dorsiflexion 0/5 LLE Sensation: WNL    Cervical / Trunk Assessment Cervical / Trunk Assessment: Normal   Communication Communication Communication: No difficulties   Cognition Arousal/Alertness: Awake/alert Behavior During Therapy: WFL for tasks assessed/performed Overall Cognitive Status: Within Functional Limits for tasks assessed                                 General Comments: Good awareness of deficits   General Comments  VSS    Exercises     Shoulder Instructions      Home Living Family/patient expects to be discharged to:: Private residence Living Arrangements: Spouse/significant other Available Help at Discharge: Family;Available 24 hours/day Type of Home: House Home Access: Stairs to enter [employer] of Steps: 1 Entrance Stairs-Rails: None Home Layout: One level     Bathroom Shower/Tub: Occupational psychologist: Standard     Home Equipment: Walker - 4 wheels;Grab bars - toilet;Shower seat - built Hotel manager: [place]        Prior Functioning/Environment Level of Independence: Needs assistance  Gait / Transfers Assistance Needed: Per Chart: pt ambulates household distances with use of rollator. Pt requires PRN assist for bed mobility and transfers. Recent history of multiple falls. Symptoms began in February, prior to pt was independent ADL's / Homemaking Assistance Needed: Per Chart: spouse assists with bathing, LB dressing, IADLs            OT Problem List:        OT Treatment/Interventions:      OT Goals(Current goals can be found in the care plan section) Acute Rehab OT Goals Patient Stated Goal: to be more independent OT Goal Formulation: With patient Time For Goal Achievement: 05/15/21 Potential to Achieve Goals: Good ADL Goals Pt Will Perform Grooming: with modified independence;sitting Pt Will Perform Lower Body Bathing: with supervision;sit to/from stand;with adaptive equipment Pt Will Perform Lower Body  Dressing: with supervision;sit to/from stand;with adaptive equipment Pt Will Transfer to Toilet: with supervision;ambulating;regular height toilet Pt Will Perform Toileting - Clothing Manipulation and hygiene: with supervision;sit to/from stand  OT Frequency: Min 2X/week   Barriers to D/C:            Co-evaluation PT/OT/SLP Co-Evaluation/Treatment: Yes Reason for Co-Treatment: Complexity of the patient's impairments (multi-system involvement);Necessary to address cognition/behavior during functional activity;For patient/therapist safety;To address functional/ADL transfers PT goals addressed during session: Mobility/safety with mobility OT goals addressed during session: ADL's and self-care;Proper use of Adaptive equipment and DME;Strengthening/ROM      AM-PAC OT "6 Clicks" Daily Activity     Outcome Measure Help from another person eating meals?: None Help from another person taking care of personal grooming?: None Help from another person toileting, which includes using toliet, bedpan, or urinal?: A Little Help from another person bathing (including washing, rinsing, drying)?: A Lot Help from another person to put on and taking off regular upper body clothing?: A Little Help from another person to put on and taking off regular lower body clothing?: A Little 6 Click Score: 19   End of Session Equipment Utilized During Treatment: Gait belt;Rolling walker Nurse Communication: Mobility status;Precautions  Activity Tolerance: Patient tolerated treatment well Patient left: in chair;with call bell/phone within reach;with chair alarm set;with family/visitor present  OT Visit Diagnosis: Unsteadiness on feet (R26.81);Other abnormalities of gait and mobility (R26.89);Muscle weakness (generalized) (M62.81);Pain                Time: 9702-6378 OT Time Calculation (min): 31 min Charges:  OT General Charges $OT Visit: 1 Visit OT Evaluation $OT Re-eval: 1 Re-eval   Brynn, OTR/L  Acute  Rehabilitation Services Pager: [phone] Office: [phone] .   Jeri Modena 05/01/2021, 12:20 PM

## 2021-05-01 NOTE — Progress Notes (Signed)
Postop day 1.  Overall doing well.  No headache.  Able to stand and ambulate some with therapy today.  Left lower extremity weakness improved although still significant.  She is afebrile.  Her vital signs are stable.  She is awake and alert.  She is oriented and appropriate.  Motor examination with 3 to 4-/5 strength in her left quadriceps.  She has 3/5 strength in her left anterior tibialis and 4/5 strength in her left plantar flexors.  Remainder motor exam is intact.  Wound is clean and dry.  Chest and abdomen are benign.  Postop MRI scan with gross total resection without evidence of complicating features.  Overall doing well following craniotomy and resection of tumor.  Mobilize further.  Transfer to the floor.  Pathology pending.  Likely metastatic lung cancer.  Continue working with therapy.  Question inpatient rehab versus home with therapy.

## 2021-05-01 NOTE — Progress Notes (Signed)
PROGRESS NOTE    Tara Murphy  ZJI:[id number] DOB: 1946/07/05 DOA: 04/24/2021 PCP: Leamon Arnt, MD   Brief Narrative:  Tara Murphy is a 75 y.o. WF PMHx chronic anxiety/depression, LEFT breast cancer status postlumpectomy in 2015, left foot drop, EMG diagnosis of peripheral neuropathy, HLD, GERD, , herpes zoster, former smoker, restless leg syndrome, IBS,   Presented to [facility] ED from home after having a seizure like activity, likely focal with left lower and left upper extremity uncoordinated uncontrolled jerky movements, witnessed by her husband who is at bedside, accompanied by their daughter.   CT head shows a brain tumor 2 x 2 cm hemorrhagic posterior right frontal lobe mass and associated vasogenic edema.  She was started on Keppra, CT chest/abdomen/pelvis significant for finding of left upper lung spiculated mass suspicious for primary malignancy.   04/28/2021: Patient seen alongside patient's husband and nurse.  Neurosurgery input is appreciated.  For surgery on Monday.  Above documentation is noted.  Hopefully, the brain mass pathology will reveal primary source, if not, we will need to consult pulmonary team for possible endobronchial biopsy of the lung mass.   04/29/2021: Patient seen alongside patient's husband and stepdaughter.  No new changes.  For surgery tomorrow.   Subjective: 8/9 febrile overnight, A/O x4, sitting comfortably in bed talking with daughter     Assessment & Plan:  Covid vaccination; vaccinated 3/4  Active Problems:   GERD (gastroesophageal reflux disease)   Seizure ([place])   Brain mass   New seizure activity in the setting of newly diagnosed brain metastasis. -Neurosurgery/neurology input greatly appreciated. -Seizure medications per neurology, currently on Keppra, being loaded with Dilantin, she is on long-term EEG. -NeuroSurgery input greatly appreciated, infarct craniotomy and resection of tumor on Monday by Dr. Trenton Gammon.   -Continue with IV  steroids 04/28/2021: No further seizures noted.  Continue to monitor closely.  Seizure precaution.  Patient is on IV Keppra and IV steroids. 04/29/2021: No seizures reported. -8/9 stable   Lung cancer with brain metastasis -New finding, oncology consulted, they will await results after brain surgery regarding commendation for further treatment. -04/28/2021: Imaging studies have revealed spiculated right lung mass and brain mass.  For resection of the brain mass on Monday.  Hopefully, brain mass will reveal primary pathology, if not, will consider pulmonary consult for possible endobronchial biopsy.  LUL lung mass - Awaiting pathology from brain tumor resection which will hopefully give [name] an answer.  If not patient will require bronchoscopy with biopsy..    Elevated BP -Mildly labile - Labetalol IV PRN   Chronic anxiety/depression -BuSpar 7.5 mg BID PRN   Ambulatory dysfunction T/OT consulted  Leukocytosis Results for Tara, Murphy (MRN [id number]) as of 05/01/2021 16:05  Ref. Range 04/25/2021 04:01 04/26/2021 04:42 04/27/2021 03:41 04/29/2021 14:42 05/01/2021 05:10  WBC Latest Ref Range: 4.0 - 10.5 K/uL 6.3 13.2 (H) 11.2 (H) 11.1 (H) 17.3 (H)  -8/9 negative bands, negative fever, negative left shift.  Most likely secondary to margination secondary to her surgery.  Monitor closely for any signs of infection.  Goals of care - 8/9 patient with the possibility of admission to CIR for a short period of rehab prior to final pathology report and oncology plan of care being initiated.  DVT prophylaxis: SCD Code Status: Full Family Communication: 8/9 daughter at bedside for discussion of plan of care all questions answered Status is: Inpatient    Dispo: The patient is from: Home  Anticipated d/c is to: SNF vs home              Anticipated d/c date is: > 3 days              Patient currently is not medically stable to d/c.      Consultants:   Neurosurgery CIR Oncology   Procedures/Significant Events:  8/3 CT chest abdomen pelvis W contrast:Large spiculated left upper lobe suprahilar mass most consistent with primary bronchogenic malignancy. This may be amenable to endobronchial sampling given proximity to the left upper bronchus. 2. Indeterminate 8 mm right upper lobe pulmonary nodule. Metastatic disease cannot be excluded. Attention on follow-up recommended. 3. No acute intra-abdominal or intrapelvic process. No evidence of subdiaphragmatic metastases. 4. Sigmoid diverticulosis without diverticulitis. 8/8 Bilateral parietal craniotomy with resection of brain tumor, microdissection, intraoperative stereotactic navigation for  volumetric resection   I have personally reviewed and interpreted all radiology studies and my findings are as above.  VENTILATOR SETTINGS:    Cultures   Antimicrobials:    Devices    LINES / TUBES:      Continuous Infusions:     Objective: Vitals:   05/01/21 0800 05/01/21 1000 05/01/21 1100 05/01/21 1200  BP: 127/61 133/70 137/76 (!) 145/77  Pulse: 77 72 72 71  Resp: 16 13 17 18   Temp:    97.7 F (36.5 C)  TempSrc:    Axillary  SpO2: 95% 94% 95% 95%  Weight:      Height:        Intake/Output Summary (Last 24 hours) at 05/01/2021 1601 Last data filed at 05/01/2021 1300 Gross per 24 hour  Intake 1807.88 ml  Output 1450 ml  Net 357.88 ml    Filed Weights   04/24/21 1807  Weight: 94.3 kg    Examination:  General: A/O x4, No acute respiratory distress Eyes: negative scleral hemorrhage, negative anisocoria, negative icterus ENT: Negative Runny nose, negative gingival bleeding, Neck:  Negative scars, masses, torticollis, lymphadenopathy, JVD Lungs: Clear to auscultation bilaterally without wheezes or crackles Cardiovascular: Regular rate and rhythm without murmur gallop or rub normal S1 and S2 Abdomen: negative abdominal pain, nondistended, positive soft, bowel  sounds, no rebound, no ascites, no appreciable mass Extremities: No significant cyanosis, clubbing, or edema bilateral lower extremities Skin: Negative rashes, lesions, ulcers Psychiatric:  Negative depression, negative anxiety, negative fatigue, negative mania  Central nervous system:  Cranial nerves II through XII intact, tongue/uvula midline, all extremities muscle strength 5/5, except LEFT upper extremity strength 4/5, LEFT lower extremity strength 3/5, sensation intact throughout, negative dysarthria, negative expressive aphasia, negative receptive aphasia.  .     Data Reviewed: Care during the described time interval was provided by me .  I have reviewed this patient's available data, including medical history, events of note, physical examination, and all test results as part of my evaluation.   CBC: Recent Labs  Lab 04/24/21 1758 04/25/21 0401 04/26/21 0442 04/27/21 0341 04/29/21 1442 04/30/21 0936 05/01/21 0510  WBC 9.8 6.3 13.2* 11.2* 11.1*  --  17.3*  NEUTROABS 7.9*  --   --   --   --   --  14.9*  HGB 14.1 14.0 13.0 12.6 14.7 13.9 13.1  HCT 44.7 43.0 39.0 37.8 43.4 41.0 39.2  MCV 94.9 93.9 91.5 91.7 90.0  --  92.0  PLT 364 319 347 337 354  --  295    Basic Metabolic Panel: Recent Labs  Lab 04/25/21 0401 04/26/21 0442 04/27/21 0341 04/30/21 0356  04/30/21 0936 05/01/21 0510  NA 139 138 138 135 138 136  K 3.9 4.0 4.3 5.3* 4.3 4.6  CL 107 104 107 105  --  108  CO2 22 23 24  19*  --  24  GLUCOSE 165* 148* 144* 140*  --  118*  BUN 11 15 18 20   --  18  CREATININE 0.50 0.61 0.64 0.56  --  0.53  CALCIUM 9.1 9.8 9.0 9.1  --  8.4*  MG 1.9  --   --   --   --  2.0  PHOS 4.2  --   --  4.4  --  3.7    GFR: Estimated Creatinine Clearance: 71.4 mL/min (by C-G formula based on SCr of 0.53 mg/dL). Liver Function Tests: Recent Labs  Lab 04/24/21 1758 04/26/21 0442 04/30/21 0356 05/01/21 0510  AST 37 20  --  42*  ALT 43 34  --  97*  ALKPHOS 85 78  --  79  BILITOT  0.6 0.5  --  0.4  PROT 7.1 6.1*  --  5.5*  ALBUMIN 4.0 3.4* 3.4* 3.0*    No results for input(s): LIPASE, AMYLASE in the last 168 hours. No results for input(s): AMMONIA in the last 168 hours. Coagulation Profile: No results for input(s): INR, PROTIME in the last 168 hours. Cardiac Enzymes: No results for input(s): CKTOTAL, CKMB, CKMBINDEX, TROPONINI in the last 168 hours. BNP (last 3 results) No results for input(s): PROBNP in the last 8760 hours. HbA1C: No results for input(s): HGBA1C in the last 72 hours. CBG: Recent Labs  Lab 04/30/21 1641 04/30/21 1931 05/01/21 0725 05/01/21 1134 05/01/21 1543  GLUCAP 151* 137* 123* 116* 179*    Lipid Profile: No results for input(s): CHOL, HDL, LDLCALC, TRIG, CHOLHDL, LDLDIRECT in the last 72 hours. Thyroid Function Tests: No results for input(s): TSH, T4TOTAL, FREET4, T3FREE, THYROIDAB in the last 72 hours. Anemia Panel: No results for input(s): VITAMINB12, FOLATE, FERRITIN, TIBC, IRON, RETICCTPCT in the last 72 hours. Urine analysis:    Component Value Date/Time   COLORURINE YELLOW 12/07/2015 1135   APPEARANCEUR CLOUDY (A) 12/07/2015 1135   LABSPEC 1.010 12/07/2015 1135   PHURINE 5.5 12/07/2015 1135   GLUCOSEU NEGATIVE 12/07/2015 1135   HGBUR NEGATIVE 12/07/2015 1135   BILIRUBINUR neg 04/16/2021 1504   KETONESUR NEGATIVE 12/07/2015 1135   PROTEINUR Negative 04/16/2021 1504   PROTEINUR NEGATIVE 12/07/2015 1135   UROBILINOGEN 0.2 (A) 04/16/2021 1504   NITRITE neg 04/16/2021 1504   NITRITE NEGATIVE 12/07/2015 1135   LEUKOCYTESUR Negative 04/16/2021 1504   Sepsis Labs: @LABRCNTIP (procalcitonin:4,lacticidven:4)  ) Recent Results (from the past 240 hour(s))  SARS CORONAVIRUS 2 (TAT 6-24 HRS) Nasopharyngeal Nasopharyngeal Swab     Status: None   Collection Time: 04/25/21  3:36 PM   Specimen: Nasopharyngeal Swab  Result Value Ref Range Status   SARS Coronavirus 2 NEGATIVE NEGATIVE Final    Comment: (NOTE) SARS-CoV-2 target  nucleic acids are NOT DETECTED.  The SARS-CoV-2 RNA is generally detectable in upper and lower respiratory specimens during the acute phase of infection. Negative results do not preclude SARS-CoV-2 infection, do not rule out co-infections with other pathogens, and should not be used as the sole basis for treatment or other patient management decisions. Negative results must be combined with clinical observations, patient history, and epidemiological information. The expected result is Negative.  Fact Sheet for Patients: SugarRoll.be  Fact Sheet for Healthcare Providers: https://www.[employer]er-mathews.com/  This test is not yet approved or cleared by the [place]  States FDA and  has been authorized for detection and/or diagnosis of SARS-CoV-2 by FDA under an Emergency Use Authorization (EUA). This EUA will remain  in effect (meaning this test can be used) for the duration of the COVID-19 declaration under Se ction 564(b)(1) of the Act, 21 U.S.C. section 360bbb-3(b)(1), unless the authorization is terminated or revoked sooner.  Performed at [facility], [place] [address], [place], [place] [address]   Surgical PCR screen     Status: None   Collection Time: 04/29/21 10:46 PM   Specimen: Nasal Mucosa; Nasal Swab  Result Value Ref Range Status   MRSA, PCR NEGATIVE NEGATIVE Final   Staphylococcus aureus NEGATIVE NEGATIVE Final    Comment: (NOTE) The Xpert SA Assay (FDA approved for NASAL specimens in patients 36 years of age and older), is one component of a comprehensive surveillance program. It is not intended to diagnose infection nor to guide or monitor treatment. Performed at [facility], [place] [address], [place], [place] [address]          Radiology Studies: MR BRAIN W WO CONTRAST  Result Date: 05/01/2021 CLINICAL DATA:  Follow-up resection of hemorrhagic metastasis at the right vertex. EXAM: MRI HEAD WITHOUT AND WITH  CONTRAST TECHNIQUE: Multiplanar, multiecho pulse sequences of the brain and surrounding structures were obtained without and with intravenous contrast. CONTRAST:  27mL GADAVIST GADOBUTROL 1 MMOL/ML IV SOLN COMPARISON:  04/28/2021.  04/24/2021. FINDINGS: Brain: Interval right frontoparietal vertex craniotomy for resection of a cystic hemorrhagic metastasis at the vertex. Gross total resection of the mass. Blood products within the post resection space, which measures 16 mm, compared with the lesion size of 25 mm. Enhancement along the margins of the resection is not unexpected and will need to be followed for evidence of regression or progression. Regional vasogenic edema appears similar. No new brain lesion is seen. No evidence of perioperative infarction. No hydrocephalus. No extra-axial collection. Vascular: Major vessels at the base of the brain show flow. Superior sagittal sinus remains patent. Skull and upper cervical spine: Otherwise negative Sinuses/Orbits: Clear/normal Other: None IMPRESSION: Gross total resection of the hemorrhagic cystic metastasis at the right brain vertex. No operative complication is discernible. Regional edema appears similar. Linear enhancement along the margins of the post resection space will need to be followed for evidence regression or progression. Electronically Signed   By: Nelson Chimes M.D.   On: 05/01/2021 07:46        Scheduled Meds:  Chlorhexidine Gluconate Cloth  6 each Topical Daily   dexamethasone  2 mg Oral Q12H   diphenhydrAMINE  50 mg Oral Once   Or   diphenhydrAMINE  50 mg Intravenous Once   fluticasone  2 spray Each Nare Daily   gabapentin  600 mg Oral BID   gabapentin  800 mg Oral Once   insulin aspart  0-5 Units Subcutaneous QHS   insulin aspart  0-9 Units Subcutaneous TID WC   levETIRAcetam  1,000 mg Oral BID   pantoprazole (PROTONIX) IV  40 mg Intravenous Q24H   rOPINIRole  4 mg Oral QHS   Continuous Infusions:     LOS: 7 days   The  patient is critically ill with multiple organ systems failure and requires high complexity decision making for assessment and support, frequent evaluation and titration of therapies, application of advanced monitoring technologies and extensive interpretation of multiple databases. Critical Care Time devoted to patient care services described in this note  Time spent: 40 minutes  Cosmo Tetreault, Geraldo Docker, MD [facility]   If 7PM-7AM, please contact night-coverage 05/01/2021, 4:01 PM

## 2021-05-01 NOTE — Evaluation (Signed)
Physical Therapy Re-Evaluation Patient Details Name: Tara Murphy MRN: [id number] DOB: 12-17-1945 Today's Date: 05/01/2021   History of Present Illness  75 y.o. female presents to [facility] ED on 04/24/2021 after seizure-like activity (L sided jerking movements). 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 S/p bilateral parietal craniotomy with resection of brain tumor 04/30/2021.PMH includes left breast cancer status postlumpectomy in 2015, left foot drop, EMG diagnosis of peripheral neuropathy, hyperlipidemia, GERD, chronic anxiety/depression, herpes zoster, former smoker, restless leg syndrome, IBS.  Clinical Impression  Pt re-evaluated s/p craniotomy. Pt verbalizes good pain control; motivated to participate in therapy session. Pt with noted improved left quadriceps strength, however, continues with 0/5 ankle dorsiflexion or plantarflexion. Requiring two person min-mod assist for functional mobility. Ambulating a total of ~15 ft with a walker, L AFO and close chair follow. Presents with left sided weakness, gait abnormalities, balance deficits. Highly recommend CIR to address deficits and maximize functional mobility. Suspect excellent progress given PLOF, motivation and family support.     Follow Up Recommendations CIR    Equipment Recommendations  Rolling walker with 5" wheels;Wheelchair (measurements PT);Wheelchair cushion (measurements PT)    Recommendations for Other Services Rehab consult     Precautions / Restrictions Precautions Precautions: Fall;Other (comment) Precaution Comments: seizure, L foot drop Required Braces or Orthoses: Other Brace Other Brace: AFO in room Restrictions Weight Bearing Restrictions: No      Mobility  Bed Mobility Overal bed mobility: Needs Assistance Bed Mobility: Supine to Sit     Supine to sit: Min assist     General bed mobility comments: Initiating well, use of bed pad to scoot  hip anteriorly to edge of bed    Transfers Overall transfer level: Needs assistance Equipment used: Rolling walker (2 wheeled) Transfers: Sit to/from Stand Sit to Stand: Mod assist;+2 physical assistance         General transfer comment: Up to modA + 2 to rise to stand, pt with L foot placed slightly forward, cues provided for hand placement with transition to sitting  Ambulation/Gait Ambulation/Gait assistance: Mod assist;+2 physical assistance Gait Distance (Feet): 15 Feet (5 ft, 10 ft) Assistive device: Rolling walker (2 wheeled) Gait Pattern/deviations: Step-to pattern;Decreased dorsiflexion - left;Step-through pattern;Decreased stance time - left;Trendelenburg;Narrow base of support Gait velocity: decreased Gait velocity interpretation: <1.8 ft/sec, indicate of risk for recurrent falls General Gait Details: Pt ambulating 5 ft, then an additional 10 ft with modA and chair follow. Cues for sequencing, use of walker, neutral L foot positioning, upright posture.  Stairs            Wheelchair Mobility    Modified Rankin (Stroke Patients Only)       Balance Overall balance assessment: Needs assistance Sitting-balance support: Feet supported Sitting balance-Leahy Scale: Good     Standing balance support: Bilateral upper extremity supported Standing balance-Leahy Scale: Poor Standing balance comment: reliant on RW                             Pertinent Vitals/Pain Pain Assessment: Faces Faces Pain Scale: Hurts a little bit Pain Location: headache Pain Descriptors / Indicators: Headache Pain Intervention(s): Monitored during session;Premedicated before session    Home Living Family/patient expects to be discharged to:: Private residence Living Arrangements: Spouse/significant other Available Help at Discharge: Family;Available 24 hours/day Type of Home: House Home Access: Stairs to enter Entrance Stairs-Rails: None Entrance Stairs-Number of Steps:  1 Home Layout: One level Home Equipment: Walker - 4 wheels;Grab bars - toilet;Shower seat - built Investment banker, operational      Prior Function Level of Independence: Needs assistance   Gait / Transfers Assistance Needed: Per Chart: pt ambulates household distances with use of rollator. Pt requires PRN assist for bed mobility and transfers. Recent history of multiple falls. Symptoms began in February, prior to pt was independent  ADL's / Homemaking Assistance Needed: Per Chart: spouse assists with bathing, LB dressing, IADLs        Hand Dominance   Dominant Hand: Right    Extremity/Trunk Assessment   Upper Extremity Assessment Upper Extremity Assessment: Defer to OT evaluation    Lower Extremity Assessment Lower Extremity Assessment: LLE deficits/detail LLE Deficits / Details: Hip flexion 2/5, knee extension 5/5, ankle dorsiflexion 0/5 LLE Sensation: WNL    Cervical / Trunk Assessment Cervical / Trunk Assessment: Normal  Communication   Communication: No difficulties  Cognition Arousal/Alertness: Awake/alert Behavior During Therapy: WFL for tasks assessed/performed Overall Cognitive Status: Within Functional Limits for tasks assessed                                 General Comments: Good awareness of deficits      General Comments  VSS    Exercises     Assessment/Plan    PT Assessment Patient needs continued PT services  PT Problem List Decreased strength;Decreased activity tolerance;Decreased balance;Decreased mobility;Impaired sensation       PT Treatment Interventions DME instruction;Gait training;Stair training;Functional mobility training;Therapeutic activities;Therapeutic exercise;Balance training;Neuromuscular re-education;Patient/family education    PT Goals (Current goals can be found in the Care Plan section)  Acute Rehab PT Goals Patient Stated Goal: to be more independent PT Goal Formulation: With patient Time For Goal Achievement:  05/15/21 Potential to Achieve Goals: Good    Frequency Min 4X/week   Barriers to discharge        Co-evaluation PT/OT/SLP Co-Evaluation/Treatment: Yes Reason for Co-Treatment: For patient/therapist safety;To address functional/ADL transfers PT goals addressed during session: Mobility/safety with mobility         AM-PAC PT "6 Clicks" Mobility  Outcome Measure Help needed turning from your back to your side while in a flat bed without using bedrails?: A Little Help needed moving from lying on your back to sitting on the side of a flat bed without using bedrails?: A Little Help needed moving to and from a bed to a chair (including a wheelchair)?: A Lot Help needed standing up from a chair using your arms (e.g., wheelchair or bedside chair)?: A Lot Help needed to walk in hospital room?: Total Help needed climbing 3-5 steps with a railing? : Total 6 Click Score: 12    End of Session Equipment Utilized During Treatment: Gait belt;Other (comment) (AFO) Activity Tolerance: Patient tolerated treatment well Patient left: in chair;with call bell/phone within reach;with chair alarm set;with family/visitor present Nurse Communication: Mobility status PT Visit Diagnosis: Unsteadiness on feet (R26.81);Other abnormalities of gait and mobility (R26.89);Muscle weakness (generalized) (M62.81);Other symptoms and signs involving the nervous system (R29.898)    Time: 0109-3235 PT Time Calculation (min) (ACUTE ONLY): 30 min   Charges:   PT Evaluation $PT Re-evaluation: 1 Re-eval        Wyona Almas, PT, DPT Acute Rehabilitation Services Pager [phone] Office [place] 05/01/2021, 11:00 AM

## 2021-05-01 NOTE — Progress Notes (Signed)
Inpatient Rehabilitation Admissions Coordinator   Inpatient rehab consult received. I met at bedside with patient and her spouse. We discussed goals and expectations of a possible CIR admit. They both prefer CIR rather than SNF. I will follow up tomorrow for timing of admit when cleared by [facility] and surgeon.She is a great candidate.  Danne Baxter, RN, MSN Rehab Admissions Coordinator [phone] 05/01/2021 12:00 PM

## 2021-05-01 NOTE — PMR Pre-admission (Signed)
PMR Admission Coordinator Pre-Admission Assessment  Patient: Tara Murphy is an 75 y.o., female MRN: [id number] DOB: 1945/10/24 Height: _0  (167.6 cm) Weight: 94.3 kg  Insurance Information HMO:     PPO:      PCP:      IPA:      80/20:      OTHER:  PRIMARY: Medicare a and b      Policy#: [id number]      Subscriber: pt Benefits:  Phone #: passport one source     Name: 05/01/21 Eff. Date: a 12/23/2011 and b 01/21/2014     Deduct: $1556      Out of Pocket Max: none      Life Max: none CIR: 100%      SNF: 20 full days Outpatient: 80%     Co-Pay: 20% Home Health: 100%      Co-Pay: none DME: 80%     Co-Pay: 20% Providers: pt choice  SECONDARY: A[employer] medicare supplement      Policy#: [id number]  Financial Counselor:       Phone#:   The "Data Collection Information Summary" for patients in Inpatient Rehabilitation Facilities with attached "Privacy Act [place] Records" was provided and verbally reviewed with: Patient  Emergency Contact Information Contact Information     Name Relation Home Work [place] Spouse [phone]  [phone]   Sowell,Shannon Daughter [phone]  [phone]       Current Medical History  Patient Admitting Diagnosis: s/p craniotomy for resection of tumor  History of Present Illness: 75 year old right-handed female with history of left breast cancer 2015 with radiation therapy, Hepatic steatosis intermittently elevated since 2018, right total hip and left total knee arthroplasty, quit smoking 29 years ago, history of recurrent UTIs, depression, 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  Per chart review lives with spouse.  1 level home one-step to entry.  Ambulates household distances with the use of a Rollator and has had a history of falls that began in February but was independent prior.  Presented 04/30/2021 after seizure-like activity left side jerking movements.  CT/MRI of  the head showed a hemorrhagic lesion of the paramedian superior right frontal lobe measuring 2.6 x 1.6 cm.  There is moderate surrounding vasogenic edema.  The midline structures were normal.  Findings most consistent with solitary metastasis.  CT of the chest showed a large spiculated left upper lobe suprahilar mass consistent with primary bronchogenic malignancy.  EEG was negative for seizure.  She had been loaded with Keppra and remains on 1000 mg twice daily.  Patient underwent bilateral parietal craniotomy with resection of brain tumor microdissection intraoperative stereotactic navigation for volumetric resection 04/30/2021 per Dr. Annette Stable.  Decadron protocol as indicated.  Pathology consistent with metastatic lung carcinoma and neurosurgery plans to discuss with radiation oncology in regards to postoperative SRS treatment.  Tolerating a regular consistency diet.  Therapy evaluations completed due to patient decreased functional mobility was admitted for a comprehensive rehab program.  Patient's medical record from [facility]  has been reviewed by the rehabilitation admission coordinator and physician.  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[place])    S/p total hip replacement left   Breast cancer ([place]) 2015   left    Cataract    Cholecystolithiasis    Chronic allergic rhinitis    Depression  Former smoker, stopped smoking in distant past 10/05/2019   Quit 2000; 37 y smoking history   GERD (gastroesophageal reflux disease)    Hepatic steatosis 06/08/2020   Intermittent elevated LFTs and ultrasound 2018   Hyperlipemia    IBS (irritable bowel syndrome) 10/08/2018   Nl colonoscopy and EGD 09/2018   IBS (irritable bowel syndrome)    Lichen plano-pilaris    Major depression, chronic 09/05/2015   Neuropathy, peripheral, idiopathic    uses gabapentin   Osteopenia 09/07/2019   Dexa 08/2019: T = -1.2 at  wrist; back and hips excluded (DJD and hardware).    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 Repeat 2020   Urinary tract infection    hx of frequent     Family History   family history includes Arthritis in her father and mother; Breast cancer (age of onset: 46) in her sister; Crohn's disease in her mother; Diabetes in her mother; Hyperlipidemia in her maternal aunt; Lung cancer in her father; Stomach cancer in her maternal grandmother.  Prior Rehab/Hospitalizations Has the patient had prior rehab or hospitalizations prior to admission? Yes  Has the patient had major surgery during 100 days prior to admission? Yes   Current Medications  Current Facility-Administered Medications:    busPIRone (BUSPAR) tablet 7.5 mg, 7.5 mg, Oral, BID PRN, Allie Bossier, MD   Chlorhexidine Gluconate Cloth 2 % PADS 6 each, 6 each, Topical, Daily, Earnie Larsson, MD, 6 each at 05/01/21 1300   dexamethasone (DECADRON) tablet 2 mg, 2 mg, Oral, Q12H, Pool, Mallie Mussel, MD, 2 mg at 05/03/21 0905   diphenhydrAMINE (BENADRYL) capsule 50 mg, 50 mg, Oral, Once **OR** diphenhydrAMINE (BENADRYL) injection 50 mg, 50 mg, Intravenous, Once, Earnie Larsson, MD   fluticasone (FLONASE) 50 MCG/ACT nasal spray 2 spray, 2 spray, Each Nare, Daily, Pool, Mallie Mussel, MD, 2 spray at 05/02/21 0830   gabapentin (NEURONTIN) capsule 600 mg, 600 mg, Oral, BID, Earnie Larsson, MD, 600 mg at 05/03/21 0905   gabapentin (NEURONTIN) capsule 800 mg, 800 mg, Oral, Once, Earnie Larsson, MD   HYDROcodone-acetaminophen (NORCO/VICODIN) 5-325 MG per tablet 1 tablet, 1 tablet, Oral, Q4H PRN, Earnie Larsson, MD, 1 tablet at 05/02/21 2135   insulin aspart (novoLOG) injection 0-5 Units, 0-5 Units, Subcutaneous, QHS, Pool, Mallie Mussel, MD, 2 Units at 04/29/21 2247   insulin aspart (novoLOG) injection 0-9 Units, 0-9 Units, Subcutaneous, TID WC, Earnie Larsson, MD, 2 Units at 05/01/21  1615   labetalol (NORMODYNE) injection 10-40 mg, 10-40 mg, Intravenous, Q10 min PRN, Earnie Larsson, MD   levETIRAcetam (KEPPRA) tablet 1,000 mg, 1,000 mg, Oral, BID, Earnie Larsson, MD, 1,000 mg at 05/03/21 0905   LORazepam (ATIVAN) injection 2 mg, 2 mg, Intravenous, Q4H PRN, Earnie Larsson, MD   LORazepam (ATIVAN) tablet 1 mg, 1 mg, Oral, Q6H PRN, Earnie Larsson, MD, 1 mg at 05/02/21 2135   morphine 2 MG/ML injection 1-2 mg, 1-2 mg, Intravenous, Q2H PRN, Earnie Larsson, MD, 2 mg at 04/30/21 2345   naloxone (NARCAN) injection 0.08 mg, 0.08 mg, Intravenous, PRN, Earnie Larsson, MD   ondansetron (ZOFRAN) tablet 4 mg, 4 mg, Oral, Q4H PRN **OR** ondansetron (ZOFRAN) injection 4 mg, 4 mg, Intravenous, Q4H PRN, Pool, Mallie Mussel, MD   pantoprazole (PROTONIX) EC tablet 40 mg, 40 mg, Oral, Daily, Nicole Kindred A, DO, 40 mg at 05/03/21 0905   polyethylene glycol (MIRALAX / GLYCOLAX) packet 17 g, 17 g, Oral, Daily PRN, Pool,  Mallie Mussel, MD, 17 g at 05/02/21 0830   promethazine (PHENERGAN) tablet 12.5-25 mg, 12.5-25 mg, Oral, Q4H PRN, Earnie Larsson, MD   rOPINIRole (REQUIP) tablet 4 mg, 4 mg, Oral, QHS, Pool, Mallie Mussel, MD, 4 mg at 05/02/21 2129  Patients Current Diet:  Diet Order             Diet - low sodium heart healthy           Diet Carb Modified Fluid consistency: Thin; Room service appropriate? Yes  Diet effective now                   Precautions / Restrictions Precautions Precautions: Fall, Other (comment) (aspiration) Precaution Comments: seizure, L foot drop Other Brace: AFO in room Restrictions Weight Bearing Restrictions: No Other Position/Activity Restrictions: L foot drop   Has the patient had 2 or more falls or a fall with injury in the past year? Yes  Prior Activity Level Limited Community (1-2x/wk): Independent prior to 2/22; assisted with adls since February  Prior Functional Level Self Care: Did the patient need help bathing, dressing, using the toilet or eating? Needed some help  Indoor  Mobility: Did the patient need assistance with walking from room to room (with or without device)? Needed some help  Stairs: Did the patient need assistance with internal or external stairs (with or without device)? Needed some help  Functional Cognition: Did the patient need help planning regular tasks such as shopping or remembering to take medications? Independent  Home Assistive Devices / Equipment Home Assistive Devices/Equipment: Environmental consultant (specify type) Home Equipment: Walker - 4 wheels, Grab bars - toilet, Shower seat - built in, [employer] equipment  Prior Device Use: Indicate devices/aids used by the patient prior to current illness, exacerbation or injury? Walker  Current Functional Level Cognition  Overall Cognitive Status: Within Functional Limits for tasks assessed Current Attention Level: Sustained Orientation Level: Oriented X4 Following Commands: Follows one step commands with increased time Safety/Judgement: Decreased awareness of deficits General Comments: Followed one step commands accurately    Extremity Assessment (includes Sensation/Coordination)  Upper Extremity Assessment: Overall WFL for tasks assessed (denies changes) LUE Deficits / Details: 4 / 5 MMT noted LUE Sensation: WNL LUE Coordination: decreased fine motor, decreased gross motor  Lower Extremity Assessment: Defer to PT evaluation LLE Deficits / Details: Hip flexion 2/5, knee extension 5/5, ankle dorsiflexion 0/5 LLE Sensation: WNL    ADLs  Overall ADL's : Needs assistance/impaired Eating/Feeding: Set up, Sitting Grooming: Wash/dry hands, Wash/dry face, Set up, Bed level Upper Body Dressing : Minimal assistance, Bed level Lower Body Dressing: Moderate assistance, Sit to/from stand Lower Body Dressing Details (indicate cue type and reason): able to figure 4 both sides but needs (A) with L LE. pt able to don doff socks bil LE. pt requires (A) to don L shoe with max (A). Pt uses shoe horn at baseline.  p Toilet Transfer: +2 for physical assistance, Ambulation, Minimal assistance, RW Toilet Transfer Details (indicate cue type and reason): simulated with chair and eob Functional mobility during ADLs: +2 for physical assistance, Moderate assistance    Mobility  Overal bed mobility: Needs Assistance Bed Mobility: Supine to Sit Supine to sit: HOB elevated, Min assist Sit to supine: Mod assist General bed mobility comments: Performed supine to sit with A from daughter. Initiated well and scooted hips EOB with supervision.    Transfers  Overall transfer level: Needs assistance Equipment used: Rolling walker (2 wheeled) Transfers: Sit to/from Stand Sit to Stand: Mod  assist, +2 physical assistance Stand pivot transfers: Mod assist, +2 safety/equipment General transfer comment: Mod A +2 for sit to stand to power up to standing with verbal cueing provided for hand placement on bed and RW.    Ambulation / Gait / Stairs / Wheelchair Mobility  Ambulation/Gait Ambulation/Gait assistance: Mod assist, +2 safety/equipment Gait Distance (Feet): 25 Feet Assistive device: Rolling walker (2 wheeled) Gait Pattern/deviations: Step-to pattern, Decreased dorsiflexion - left, Step-through pattern, Decreased stance time - left, Trendelenburg, Narrow base of support General Gait Details: Pt ambulated 51ft with Mod A +2 for safety and chair follow from daughter. Verbal cues provided for sequencing, postural adjustments, and use of RW. Gait velocity: Decreased Gait velocity interpretation: <1.8 ft/sec, indicate of risk for recurrent falls    Posture / Balance Dynamic Sitting Balance Sitting balance - Comments: Able to weight shift to don shoes and AFO with A from shoe horn Balance Overall balance assessment: Needs assistance Sitting-balance support: Feet supported, No upper extremity supported Sitting balance-Leahy Scale: Good Sitting balance - Comments: Able to weight shift to don shoes and AFO with A from  shoe horn Standing balance support: Bilateral upper extremity supported Standing balance-Leahy Scale: Poor Standing balance comment: BUE and external support    Special needs/care consideration Fall precautions and she has fear of following since has had multiple falls in past few months New diagnosis of cancer this admission        Previous Home Environment  Living Arrangements: Spouse/significant other  Lives With: Spouse Available Help at Discharge: Family, Available 24 hours/day Type of Home: House Home Layout: One level Home Access: Stairs to enter Entrance Stairs-Rails: None Entrance Stairs-Number of Steps: 1 Bathroom Shower/Tub: Multimedia programmer: Standard Bathroom Accessibility: Yes How Accessible: Accessible via walker Home Care Services: No  Discharge Living Setting Plans for Discharge Living Setting: Patient's home, Lives with (comment) (spouse) Type of Home at Discharge: House Discharge Home Layout: One level Discharge Home Access: Stairs to enter Entrance Stairs-Rails: None Entrance Stairs-Number of Steps: 1 Discharge Bathroom Shower/Tub: Horticulturist, commercial: Standard Discharge Bathroom Accessibility: Yes How Accessible: Accessible via walker Does the patient have any problems obtaining your medications?: No  Social/Family/Support Systems Patient Roles: Spouse Contact Information: daughter, Larene Beach main contact for spouse very HOH Anticipated Caregiver: spouse and daughter Anticipated Caregiver's Contact Information: see above Ability/Limitations of Caregiver: ELOS 10 to 12 days Caregiver Availability: 24/7 Discharge Plan Discussed with Primary Caregiver: Yes Is Caregiver In Agreement with Plan?: Yes Does Caregiver/Family have Issues with Lodging/Transportation while Pt is in Rehab?: No  Spouse HOH and daughter, Larene Beach is her health care POA. Pt's spouse is stepfather to [place] of 39 years. There is some tension between  two daughters and stepfather.  Goals Patient/Family Goal for Rehab: supervision PT, supervision to min OT Expected length of stay: ELOS 10 to 12 days Pt/Family Agrees to Admission and willing to participate: Yes Program Orientation Provided & Reviewed with Pt/Caregiver Including Roles  & Responsibilities: Yes  Decrease burden of Care through IP rehab admission: n/a  Possible need for SNF placement upon discharge: not anticipated  Patient Condition: I have reviewed [facility] , spoken with CM, and patient and spouse. I met with patient at the bedside for inpatient rehabilitation assessment.  Patient will benefit from ongoing PT and OT, can actively participate in 3 hours of therapy a day 5 days of the week, and can make measurable gains during the admission.  Patient will also benefit  from the coordinated team approach during an Inpatient Acute Rehabilitation admission.  The patient will receive intensive therapy as well as Rehabilitation physician, nursing, social worker, and care management interventions.  Due to bladder management, bowel management, safety, skin/wound care, disease management, medication administration, pain management, and patient education the patient requires 24 hour a day rehabilitation nursing.  The patient is currently mod assist overall with mobility and basic ADLs.  Discharge setting and therapy post discharge at home with home health is anticipated.  Patient has agreed to participate in the Acute Inpatient Rehabilitation Program and will admit today.  Preadmission Screen Completed By:  Cleatrice Burke, 05/03/2021 10:47 AM ______________________________________________________________________   Discussed status with Dr. Ranell Patrick on 05/03/2021 at 1048 and received approval for admission today.  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 Yes Co-Morbidities requiring supervision/potential complications: seizure, GERD, OA of knee. 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 Yes Does the patient require coordinated care of a physician, rehab nurse, PT, OT to address physical and functional deficits in the context of the above medical diagnosis(es)? Yes Addressing deficits in the following areas: balance, endurance, locomotion, strength, transferring, bowel/bladder control, bathing, dressing, feeding, grooming, toileting, and psychosocial support Can the patient actively participate in an intensive therapy program of at least 3 hrs of therapy 5 days a week? Yes The potential for patient to make measurable gains while on inpatient rehab is excellent Anticipated functional outcomes upon discharge from inpatient rehab: modified independent PT, modified independent OT, independent SLP Estimated rehab length of stay to reach the above functional goals is: 10-14 days Anticipated discharge destination: Home 10. Overall Rehab/Functional Prognosis: excellent   MD Signature: Leeroy Cha, MD

## 2021-05-01 NOTE — Progress Notes (Signed)
Called Pathology department and spoke to Peggye Pitt to request that surgical specimen be sent for [facility] and PDL-1 testing per request of Dr. Chryl Heck.

## 2021-05-02 ENCOUNTER — Encounter (HOSPITAL_BASED_OUTPATIENT_CLINIC_OR_DEPARTMENT_OTHER): Payer: Self-pay | Admitting: Physical Therapy

## 2021-05-02 ENCOUNTER — Encounter: Payer: Self-pay | Admitting: Family Medicine

## 2021-05-02 DIAGNOSIS — C3492 Malignant neoplasm of unspecified part of left bronchus or lung: Secondary | ICD-10-CM

## 2021-05-02 LAB — CBC WITH DIFFERENTIAL/PLATELET
Abs Immature Granulocytes: 0.11 10*3/uL — ABNORMAL HIGH (ref 0.00–0.07)
Basophils Absolute: 0 10*3/uL (ref 0.0–0.1)
Basophils Relative: 0 %
Eosinophils Absolute: 0 10*3/uL (ref 0.0–0.5)
Eosinophils Relative: 0 %
HCT: 37.4 % (ref 36.0–46.0)
Hemoglobin: 12.6 g/dL (ref 12.0–15.0)
Immature Granulocytes: 1 %
Lymphocytes Relative: 8 %
Lymphs Abs: 1 10*3/uL (ref 0.7–4.0)
MCH: 31 pg (ref 26.0–34.0)
MCHC: 33.7 g/dL (ref 30.0–36.0)
MCV: 91.9 fL (ref 80.0–100.0)
Monocytes Absolute: 0.9 10*3/uL (ref 0.1–1.0)
Monocytes Relative: 7 %
Neutro Abs: 10.6 10*3/uL — ABNORMAL HIGH (ref 1.7–7.7)
Neutrophils Relative %: 84 %
Platelets: 285 10*3/uL (ref 150–400)
RBC: 4.07 MIL/uL (ref 3.87–5.11)
RDW: 12.5 % (ref 11.5–15.5)
WBC: 12.6 10*3/uL — ABNORMAL HIGH (ref 4.0–10.5)
nRBC: 0 % (ref 0.0–0.2)

## 2021-05-02 LAB — GLUCOSE, CAPILLARY
Glucose-Capillary: 109 mg/dL — ABNORMAL HIGH (ref 70–99)
Glucose-Capillary: 99 mg/dL (ref 70–99)
Glucose-Capillary: 95 mg/dL (ref 70–99)
Glucose-Capillary: 120 mg/dL — ABNORMAL HIGH (ref 70–99)

## 2021-05-02 LAB — COMPREHENSIVE METABOLIC PANEL
ALT: 66 U/L — ABNORMAL HIGH (ref 0–44)
AST: 23 U/L (ref 15–41)
Albumin: 2.9 g/dL — ABNORMAL LOW (ref 3.5–5.0)
Alkaline Phosphatase: 73 U/L (ref 38–126)
Anion gap: 7 (ref 5–15)
BUN: 17 mg/dL (ref 8–23)
CO2: 23 mmol/L (ref 22–32)
Calcium: 8.4 mg/dL — ABNORMAL LOW (ref 8.9–10.3)
Chloride: 106 mmol/L (ref 98–111)
Creatinine, Ser: 0.5 mg/dL (ref 0.44–1.00)
GFR, Estimated: >60 mL/min (ref >60)
Glucose, Bld: 126 mg/dL — ABNORMAL HIGH (ref 70–99)
Potassium: 4.2 mmol/L (ref 3.5–5.1)
Sodium: 136 mmol/L (ref 135–145)
Total Bilirubin: 0.4 mg/dL (ref 0.3–1.2)
Total Protein: 5.5 g/dL — ABNORMAL LOW (ref 6.5–8.1)

## 2021-05-02 LAB — BPAM RBC
Blood Product Expiration Date: 202209102359
Blood Product Expiration Date: 202209102359
ISSUE DATE / TIME: 202208080249
Unit Type and Rh: 5100
Unit Type and Rh: 5100

## 2021-05-02 LAB — TYPE AND SCREEN
ABO/RH(D): O POS
Antibody Screen: NEGATIVE
Unit division: 0
Unit division: 0

## 2021-05-02 LAB — MAGNESIUM: Magnesium: 2.1 mg/dL (ref 1.7–2.4)

## 2021-05-02 LAB — SURGICAL PATHOLOGY

## 2021-05-02 LAB — PHOSPHORUS: Phosphorus: 2.8 mg/dL (ref 2.5–4.6)

## 2021-05-02 MED ORDER — PANTOPRAZOLE SODIUM 40 MG PO TBEC
40.0000 mg | DELAYED_RELEASE_TABLET | Freq: Every day | ORAL | Status: DC
  Administered 2021-05-03: 40 mg via ORAL
  Filled 2021-05-02: qty 1

## 2021-05-02 MED ORDER — PANTOPRAZOLE SODIUM 40 MG PO TBEC: 40.0000 mg | DELAYED_RELEASE_TABLET | Freq: Every day | ORAL | Status: DC

## 2021-05-02 NOTE — Progress Notes (Signed)
Overall doing very well.  Denies headache.  Continues to regain strength in her left lower extremity.  Afebrile.  Vital signs are stable.  Motor examination 5/5 bilateral upper extremities 5/5 right lower extremity 4-/5 left lower extremity.  Wound clean and dry.  Chest and abdomen benign.  Overall progressing well.  Mobilize.  Okay for transfer to rehabilitation.  I will discuss the patient with radiation oncology with regard to postoperative SRS treatment.  Still awaiting pathology with regard to tumor type and oncology referral.

## 2021-05-02 NOTE — Progress Notes (Signed)
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 No further seizure activity has been noted. 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 -- Oncology consulted, appreciate recommendations --Follow-up pathology brain tissue -- Hold off DC to CIR for now in case further procedures are needed for tissue diagnosis -- If brain mass pathology is unrevealing, will consult pulmonary for consideration of EBUS for biopsy  Elevated BP -continue as needed labetalol.  Chronic anxiety/depression -on BuSpar prior to admission.  Started on Xanax as needed in this acute setting, avoid long-term benzo use.  Ambulatory dysfunction -secondary to generalized weakness, acute issues above and chronic foot drop.  Patient has had multiple recent falls at home prior to admission. -- TOC consulted -- CIR discharge planning underway -- Fall precautions    Patient BMI: Body mass index is 33.57 kg/m.   DVT prophylaxis: Place and maintain sequential compression device Start: 05/01/21 1600 SCDs Start: 04/30/21 1201   Diet:  Diet Orders (From admission, onward)     Start     Ordered   04/30/21 1201  Diet Carb Modified Fluid consistency: Thin; Room service appropriate? Yes  Diet effective now       Question Answer Comment  Diet-HS Snack? Nothing   Calorie Level Medium 1600-2000   Fluid consistency: Thin   Room service appropriate? Yes      04/30/21 1200              Code Status: Full Code   Brief Narrative / Hospital Course to Date:    Tara Murphy is a 75 y.o. female with PMHx of chronic anxiety/depression, LEFT breast cancer status post lumpectomy in 2015, left foot drop, EMG diagnosis of peripheral neuropathy, HLD, GERD, herpes zoster, former smoker, restless leg syndrome, IBS.  She presented to the ED after seizure-like activity at home, witness by husband.  She was found to have a 2x2cm brain mass with hemorrhagic posterior right frontal lobe mass and associated vasogenic edema.  Started on Keppra.  Neurosurgery consulted, and patient underwent bilateral parietal craniotomy with resection of the brain mass.  Tissue sent for pathology.  CT chest/abdomen/pelvis showed a spiculated left upper lobe lung mass, suspicious for primary malignancy.  There is also a 8 mm lung nodule on the right.  No other findings of malignancy in the chest abdomen pelvis.  Subjective 05/02/21    Patient seen with two daughters at bedside this AM, CIR admissions coordinator also present.  Pt is in good spirits.  Reports feeling overall well, headache bothers her most when laying down to try to sleep.  Finally rested well overnight last night for the first time since admission.  We reviewed her CT scan together and discusses awaiting pathology results prior to d/c to CIR, in case of need for any further procedures.    Disposition Plan & Communication   Status is: Inpatient  Remains inpatient appropriate because:Ongoing diagnostic testing needed not appropriate for outpatient work up  Dispo: The patient is from: Home  Anticipated d/c is to: CIR              Patient currently is not medically stable to d/c.   Difficult to place patient No  Family Communication: 2 daughters at bedside on rounds   Consults, Procedures, Significant Events   Consultants:  Neurosurgery Neurology Oncology  Procedures:  8/8 bilateral parietal craniotomy with resection of brain tumor, microdissection, intraoperative stereotactic navigation for  volumetric resection -with neurosurgeon Dr. Annette Stable  Antimicrobials:  Anti-infectives (From admission, onward)    Start     Dose/Rate Route Frequency Ordered Stop   04/30/21 1300  ceFAZolin (ANCEF) IVPB 2g/100 mL premix        2 g 200 mL/hr over 30 Minutes Intravenous Every 8 hours 04/30/21 1200 04/30/21 2050   04/30/21 0600  ceFAZolin (ANCEF) IVPB 2g/100 mL premix        2 g 200 mL/hr over 30 Minutes Intravenous On call to O.R. 04/29/21 1959 04/30/21 0857         Micro    Objective   Vitals:   05/02/21 0445 05/02/21 0513 05/02/21 0738 05/02/21 1227  BP: (!) 153/75 137/74 (!) 155/78 (!) 121/59  Pulse: 71 (!) 56 72 69  Resp: 17 17 16 18   Temp: 98.7 F (37.1 C) 98.1 F (36.7 C) 98.6 F (37 C) 97.9 F (36.6 C)  TempSrc: Oral Oral Oral Oral  SpO2: 98% 94% 95% 93%  Weight:      Height:        Intake/Output Summary (Last 24 hours) at 05/02/2021 1600 Last data filed at 05/02/2021 1457 Gross per 24 hour  Intake 710 ml  Output 2000 ml  Net -1290 ml   Filed Weights   04/24/21 1807  Weight: 94.3 kg    Physical Exam:  General exam: awake, alert, no acute distress HEENT: Craniotomy staples intact and incision is well approximated without any bleeding drainage or surrounding signs of infection, moist mucus membranes, hearing grossly normal  Respiratory system: CTAB, no wheezes, rales or rhonchi, normal respiratory effort. Cardiovascular system: normal S1/S2, RRR, no JVD, murmurs, rubs, gallops, no pedal edema.   Gastrointestinal system: soft, NT, ND, no HSM felt, +bowel sounds. Central nervous system: A&O x4. no gross focal neurologic deficits, normal speech Extremities: moves all, no edema, normal tone Psychiatry: normal mood, congruent affect, judgement and insight appear normal  Labs   Data Reviewed: I have personally reviewed following labs and imaging studies  CBC: Recent Labs  Lab 04/26/21 0442 04/27/21 0341 04/29/21 1442 04/30/21 0936 05/01/21 0510  05/02/21 0448  WBC 13.2* 11.2* 11.1*  --  17.3* 12.6*  NEUTROABS  --   --   --   --  14.9* 10.6*  HGB 13.0 12.6 14.7 13.9 13.1 12.6  HCT 39.0 37.8 43.4 41.0 39.2 37.4  MCV 91.5 91.7 90.0  --  92.0 91.9  PLT 347 337 354  --  313 161   Basic Metabolic Panel: Recent Labs  Lab 04/26/21 0442 04/27/21 0341 04/30/21 0356 04/30/21 0936 05/01/21 0510 05/02/21 0448  NA 138 138 135 138 136 136  K 4.0 4.3 5.3* 4.3 4.6 4.2  CL 104 107 105  --  108 106  CO2 23 24 19*  --  24 23  GLUCOSE 148* 144* 140*  --  118* 126*  BUN 15 18 20   --  18 17  CREATININE 0.61 0.64 0.56  --  0.53 0.50  CALCIUM 9.8 9.0 9.1  --  8.4* 8.4*  MG  --   --   --   --  2.0 2.1  PHOS  --   --  4.4  --  3.7 2.8   GFR: Estimated Creatinine Clearance: 71.4 mL/min (by C-G formula based on SCr of 0.5 mg/dL). Liver Function Tests: Recent Labs  Lab 04/26/21 0442 04/30/21 0356 05/01/21 0510 05/02/21 0448  AST 20  --  42* 23  ALT 34  --  97* 66*  ALKPHOS 78  --  79 73  BILITOT 0.5  --  0.4 0.4  PROT 6.1*  --  5.5* 5.5*  ALBUMIN 3.4* 3.4* 3.0* 2.9*   No results for input(s): LIPASE, AMYLASE in the last 168 hours. No results for input(s): AMMONIA in the last 168 hours. Coagulation Profile: No results for input(s): INR, PROTIME in the last 168 hours. Cardiac Enzymes: No results for input(s): CKTOTAL, CKMB, CKMBINDEX, TROPONINI in the last 168 hours. BNP (last 3 results) No results for input(s): PROBNP in the last 8760 hours. HbA1C: No results for input(s): HGBA1C in the last 72 hours. CBG: Recent Labs  Lab 05/01/21 0725 05/01/21 1134 05/01/21 1543 05/02/21 0736 05/02/21 1225  GLUCAP 123* 116* 179* 109* 99   Lipid Profile: No results for input(s): CHOL, HDL, LDLCALC, TRIG, CHOLHDL, LDLDIRECT in the last 72 hours. Thyroid Function Tests: No results for input(s): TSH, T4TOTAL, FREET4, T3FREE, THYROIDAB in the last 72 hours. Anemia Panel: No results for input(s): VITAMINB12, FOLATE, FERRITIN, TIBC, IRON,  RETICCTPCT in the last 72 hours. Sepsis Labs: No results for input(s): PROCALCITON, LATICACIDVEN in the last 168 hours.  Recent Results (from the past 240 hour(s))  SARS CORONAVIRUS 2 (TAT 6-24 HRS) Nasopharyngeal Nasopharyngeal Swab     Status: None   Collection Time: 04/25/21  3:36 PM   Specimen: Nasopharyngeal Swab  Result Value Ref Range Status   SARS Coronavirus 2 NEGATIVE NEGATIVE Final    Comment: (NOTE) SARS-CoV-2 target nucleic acids are NOT DETECTED.  The SARS-CoV-2 RNA is generally detectable in upper and lower respiratory specimens during the acute phase of infection. Negative results do not preclude SARS-CoV-2 infection, do not rule out co-infections with other pathogens, and should not be used as the sole basis for treatment or other patient management decisions. Negative results must be combined with clinical observations, patient history, and epidemiological information. The expected result is Negative.  Fact Sheet for Patients: SugarRoll.be  Fact Sheet for Healthcare Providers: https://www.woods-mathews.com/  This test is not yet approved or cleared by the [place] FDA and  has been authorized for detection and/or diagnosis of SARS-CoV-2 by FDA under an Emergency Use Authorization (EUA). This EUA will remain  in effect (meaning this test can be used) for the duration of the COVID-19 declaration under Se ction 564(b)(1) of the Act, 21 U.S.C. section 360bbb-3(b)(1), unless the authorization is terminated or revoked sooner.  Performed at [facility], [place] [address] [place], [place] [address]   Surgical PCR screen     Status: None   Collection Time: 04/29/21 10:46 PM   Specimen: Nasal Mucosa; Nasal Swab  Result Value Ref Range Status   MRSA, PCR NEGATIVE NEGATIVE Final   Staphylococcus aureus NEGATIVE NEGATIVE Final    Comment: (NOTE) The Xpert SA Assay (FDA approved for NASAL specimens in patients  72 years of age and older), is one component of a comprehensive surveillance program. It is not intended to diagnose infection nor to guide or monitor treatment. Performed at [facility], [place] [address], [place], [place] [address]       Imaging Studies  MR BRAIN W WO CONTRAST  Result Date: 05/01/2021 CLINICAL DATA:  Follow-up resection of hemorrhagic metastasis at the right vertex. EXAM: MRI HEAD WITHOUT AND WITH CONTRAST TECHNIQUE: Multiplanar, multiecho pulse sequences of the brain and surrounding structures were obtained without and with intravenous contrast. CONTRAST:  32mL GADAVIST GADOBUTROL 1 MMOL/ML IV SOLN COMPARISON:  04/28/2021.  04/24/2021. FINDINGS: Brain: Interval right frontoparietal vertex craniotomy for resection of a cystic hemorrhagic metastasis at the vertex. Gross total resection of the mass. Blood products within the post resection space, which measures 16 mm, compared with the lesion size of 25 mm. Enhancement along the margins of the resection is not unexpected and will need to be followed for evidence of regression or progression. Regional vasogenic edema appears similar. No new brain lesion is seen. No evidence of perioperative infarction. No hydrocephalus. No extra-axial collection. Vascular: Major vessels at the base of the brain show flow. Superior sagittal sinus remains patent. Skull and upper cervical spine: Otherwise negative Sinuses/Orbits: Clear/normal Other: None IMPRESSION: Gross total resection of the hemorrhagic cystic metastasis at the right brain vertex. No operative complication is discernible. Regional edema appears similar. Linear enhancement along the margins of the post resection space will need to be followed for evidence regression or progression. Electronically Signed   By: Nelson Chimes M.D.   On: 05/01/2021 07:46     Medications   Scheduled Meds:  Chlorhexidine Gluconate Cloth  6 each Topical Daily   dexamethasone  2 mg Oral Q12H    diphenhydrAMINE  50 mg Oral Once   Or   diphenhydrAMINE  50 mg Intravenous Once   fluticasone  2 spray Each Nare Daily   gabapentin  600 mg Oral BID   gabapentin  800 mg Oral Once   insulin aspart  0-5 Units Subcutaneous QHS   insulin aspart  0-9 Units Subcutaneous TID WC   levETIRAcetam  1,000 mg Oral BID   [START ON 05/03/2021] pantoprazole  40 mg Oral Daily   rOPINIRole  4 mg Oral QHS   Continuous Infusions:     LOS: 8 days    Time spent: 30 minutes    Ezekiel Slocumb, DO [facility]  05/02/2021, 4:00 PM      If 7PM-7AM, please contact night-coverage. How to contact the [facility] Attending or Consulting provider [place] or covering provider during after hours [place], for this patient?    Check the care team in [facility] and look for a) attending/consulting [facility] provider listed and b) the [facility] team listed Log into [email or web address] and use [place]'s universal password to access. If you do not have the password, please contact the hospital operator. Locate the [facility] provider you are looking for under [facility] and page to a number that you can be directly reached. If you still have difficulty reaching the provider, please page the [facility] (Director on Call) for the Hospitalists listed on amion for assistance.

## 2021-05-02 NOTE — Progress Notes (Addendum)
Physical Therapy Treatment Patient Details Name: Tara Murphy MRN: [id number] DOB: 08/08/1946 Today's Date: 05/02/2021    History of Present Illness 75 y.o. female presents to [facility] ED on 04/24/2021 after seizure-like activity (L sided jerking movements). 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 S/p bilateral parietal craniotomy with resection of brain tumor 04/30/2021.PMH includes left breast cancer status postlumpectomy in 2015, left foot drop, EMG diagnosis of peripheral neuropathy, hyperlipidemia, GERD, chronic anxiety/depression, herpes zoster, former smoker, restless leg syndrome, IBS.    PT Comments    Pt tolerated treatment well with VSS throughout. Pt's daughter present during visit. Pt demonstrated bed mobility with similar level of assist to previous session and donned AFO and shoes with A from shoe horn. Pt demonstrated increased ambulation distance with chair follow and RW compared to previous session. Requires verbal cueing for postural adjustments and use of RW when ambulating. Remains excellent candidate for CIR, as pt has great family support and remaining impairments with the LLE.  Follow Up Recommendations  CIR     Equipment Recommendations  Rolling walker with 5" wheels;Wheelchair (measurements PT);Wheelchair cushion (measurements PT)    Recommendations for Other Services Rehab consult     Precautions / Restrictions Precautions Precautions: Fall;Other (comment) (aspiration) Precaution Comments: seizure, L foot drop Required Braces or Orthoses: Other Brace Other Brace: AFO in room Restrictions Weight Bearing Restrictions: No Other Position/Activity Restrictions: L foot drop    Mobility  Bed Mobility Overal bed mobility: Needs Assistance Bed Mobility: Supine to Sit     Supine to sit: HOB elevated;Min assist     General bed mobility comments: Performed supine to sit with A from daughter.  Initiated well and scooted hips EOB with supervision.    Transfers Overall transfer level: Needs assistance Equipment used: Rolling walker (2 wheeled) Transfers: Sit to/from Stand Sit to Stand: Mod assist;+2 physical assistance         General transfer comment: Mod A +2 for sit to stand to power up to standing with verbal cueing provided for hand placement on bed and RW.  Ambulation/Gait Ambulation/Gait assistance: Mod assist;+2 safety/equipment Gait Distance (Feet): 25 Feet Assistive device: Rolling walker (2 wheeled) Gait Pattern/deviations: Step-to pattern;Decreased dorsiflexion - left;Step-through pattern;Decreased stance time - left;Trendelenburg;Narrow base of support Gait velocity: Decreased Gait velocity interpretation: <1.8 ft/sec, indicate of risk for recurrent falls General Gait Details: Pt ambulated 71ft with Mod A +2 for safety and chair follow from daughter. Verbal cues provided for sequencing, postural adjustments, and use of RW.   Stairs             Wheelchair Mobility    Modified Rankin (Stroke Patients Only)       Balance Overall balance assessment: Needs assistance Sitting-balance support: Feet supported;No upper extremity supported Sitting balance-Leahy Scale: Good Sitting balance - Comments: Able to weight shift to don shoes and AFO with A from shoe horn   Standing balance support: Bilateral upper extremity supported Standing balance-Leahy Scale: Poor Standing balance comment: BUE and external support                            Cognition Arousal/Alertness: Awake/alert Behavior During Therapy: WFL for tasks assessed/performed Overall Cognitive Status: Within Functional Limits for tasks assessed  General Comments: Followed one step commands accurately      Exercises General Exercises - Lower Extremity Long Arc Quad: AAROM;Both;10 reps;Seated    General Comments General comments  (skin integrity, edema, etc.): VSS throughout      Pertinent Vitals/Pain Pain Assessment: No/denies pain    Home Living                      Prior Function            PT Goals (current goals can now be found in the care plan section) Acute Rehab PT Goals Patient Stated Goal: to be more independent PT Goal Formulation: With patient Time For Goal Achievement: 05/15/21 Potential to Achieve Goals: Good Progress towards PT goals: Progressing toward goals    Frequency    Min 4X/week      PT Plan      Co-evaluation              AM-PAC PT "6 Clicks" Mobility   Outcome Measure  Help needed turning from your back to your side while in a flat bed without using bedrails?: A Little Help needed moving from lying on your back to sitting on the side of a flat bed without using bedrails?: A Little Help needed moving to and from a bed to a chair (including a wheelchair)?: A Lot Help needed standing up from a chair using your arms (e.g., wheelchair or bedside chair)?: A Lot Help needed to walk in hospital room?: Total Help needed climbing 3-5 steps with a railing? : Total 6 Click Score: 12    End of Session Equipment Utilized During Treatment: Gait belt;Other (comment) (AFO) Activity Tolerance: Patient tolerated treatment well Patient left: in chair;with call bell/phone within reach;with family/visitor present;with chair alarm set Nurse Communication: Mobility status PT Visit Diagnosis: Unsteadiness on feet (R26.81);Other abnormalities of gait and mobility (R26.89);Muscle weakness (generalized) (M62.81);Other symptoms and signs involving the nervous system (R29.898)     Time: 5670-1410 PT Time Calculation (min) (ACUTE ONLY): 28 min  Charges:  $Gait Training: 8-22 mins $Therapeutic Activity: 8-22 mins                     Louie Casa, SPT Acute Rehab: [phone]    Domingo Dimes 05/02/2021, 1:29 PM

## 2021-05-02 NOTE — Progress Notes (Signed)
Inpatient Rehabilitation Admissions Coordinator  I met at bedside with patient and her daughters with Dr Arbutus Ped. We discussed goals and expectations of a Cir admit once medical workup complete. Pathology pending. I will follow up tomorrow.  Danne Baxter, RN, MSN Rehab Admissions Coordinator [phone] 05/02/2021 12:00 PM

## 2021-05-02 NOTE — Progress Notes (Signed)
Pt admitted to the unit as a transfer from 4N ICU. Pt A&O x4, head incision remains clean, dry and intact opened to air with staples intact. Pt oriented to the unit and room, pt in bed with call light within reach. Pt VSS, telemetry applied. Pt refused SCD's and education reinforced. Will continue to closely monitor pt. Delia Heady RN   05/01/21 2155  Vital Signs  BP 129/62  BP Location Right Arm  Patient Position (if appropriate) Lying  BP Method Automatic  Pulse Rate 74  ECG Heart Rate 74  Pulse Rate Source Monitor  Resp 20  Temp 98.7 F (37.1 C)  Temp Source Oral  Oxygen Therapy  SpO2 95 %  O2 Device Room Air

## 2021-05-03 ENCOUNTER — Encounter (HOSPITAL_COMMUNITY): Payer: Self-pay | Admitting: Physical Medicine and Rehabilitation

## 2021-05-03 ENCOUNTER — Other Ambulatory Visit: Payer: Self-pay | Admitting: Radiation Therapy

## 2021-05-03 ENCOUNTER — Other Ambulatory Visit: Payer: Self-pay

## 2021-05-03 ENCOUNTER — Inpatient Hospital Stay (HOSPITAL_COMMUNITY)
Admission: RE | Admit: 2021-05-03 | Discharge: 2021-05-16 | DRG: 949 | Disposition: A | Discharge status: Home / Self Care | Payer: Medicare Other | Source: Intra-hospital | Attending: Physical Medicine and Rehabilitation | Admitting: Physical Medicine and Rehabilitation

## 2021-05-03 DIAGNOSIS — Z87891 Personal history of nicotine dependence: Secondary | ICD-10-CM

## 2021-05-03 DIAGNOSIS — Z801 Family history of malignant neoplasm of trachea, bronchus and lung: Secondary | ICD-10-CM

## 2021-05-03 DIAGNOSIS — K219 Gastro-esophageal reflux disease without esophagitis: Secondary | ICD-10-CM | POA: Diagnosis present

## 2021-05-03 DIAGNOSIS — Z87440 Personal history of urinary (tract) infections: Secondary | ICD-10-CM

## 2021-05-03 DIAGNOSIS — R001 Bradycardia, unspecified: Secondary | ICD-10-CM | POA: Diagnosis present

## 2021-05-03 DIAGNOSIS — G9389 Other specified disorders of brain: Secondary | ICD-10-CM

## 2021-05-03 DIAGNOSIS — Z79899 Other long term (current) drug therapy: Secondary | ICD-10-CM | POA: Diagnosis not present

## 2021-05-03 DIAGNOSIS — T380X5A Adverse effect of glucocorticoids and synthetic analogues, initial encounter: Secondary | ICD-10-CM | POA: Diagnosis present

## 2021-05-03 DIAGNOSIS — C7931 Secondary malignant neoplasm of brain: Secondary | ICD-10-CM | POA: Diagnosis present

## 2021-05-03 DIAGNOSIS — I959 Hypotension, unspecified: Secondary | ICD-10-CM | POA: Diagnosis present

## 2021-05-03 DIAGNOSIS — Z8261 Family history of arthritis: Secondary | ICD-10-CM

## 2021-05-03 DIAGNOSIS — R7401 Elevation of levels of liver transaminase levels: Secondary | ICD-10-CM | POA: Diagnosis not present

## 2021-05-03 DIAGNOSIS — Z96643 Presence of artificial hip joint, bilateral: Secondary | ICD-10-CM | POA: Diagnosis present

## 2021-05-03 DIAGNOSIS — C3412 Malignant neoplasm of upper lobe, left bronchus or lung: Secondary | ICD-10-CM | POA: Diagnosis present

## 2021-05-03 DIAGNOSIS — D72829 Elevated white blood cell count, unspecified: Secondary | ICD-10-CM

## 2021-05-03 DIAGNOSIS — F4320 Adjustment disorder, unspecified: Secondary | ICD-10-CM

## 2021-05-03 DIAGNOSIS — Z800 Family history of malignant neoplasm of digestive organs: Secondary | ICD-10-CM

## 2021-05-03 DIAGNOSIS — G2581 Restless legs syndrome: Secondary | ICD-10-CM | POA: Diagnosis present

## 2021-05-03 DIAGNOSIS — K760 Fatty (change of) liver, not elsewhere classified: Secondary | ICD-10-CM | POA: Diagnosis present

## 2021-05-03 DIAGNOSIS — R739 Hyperglycemia, unspecified: Secondary | ICD-10-CM | POA: Diagnosis present

## 2021-05-03 DIAGNOSIS — F4322 Adjustment disorder with anxiety: Secondary | ICD-10-CM | POA: Diagnosis present

## 2021-05-03 DIAGNOSIS — M21372 Foot drop, left foot: Secondary | ICD-10-CM

## 2021-05-03 DIAGNOSIS — Z96652 Presence of left artificial knee joint: Secondary | ICD-10-CM | POA: Diagnosis present

## 2021-05-03 DIAGNOSIS — Z803 Family history of malignant neoplasm of breast: Secondary | ICD-10-CM

## 2021-05-03 DIAGNOSIS — R4189 Other symptoms and signs involving cognitive functions and awareness: Secondary | ICD-10-CM

## 2021-05-03 DIAGNOSIS — G936 Cerebral edema: Secondary | ICD-10-CM | POA: Diagnosis present

## 2021-05-03 DIAGNOSIS — Z9181 History of falling: Secondary | ICD-10-CM | POA: Diagnosis not present

## 2021-05-03 DIAGNOSIS — G8918 Other acute postprocedural pain: Secondary | ICD-10-CM

## 2021-05-03 DIAGNOSIS — Z9889 Other specified postprocedural states: Secondary | ICD-10-CM | POA: Diagnosis not present

## 2021-05-03 DIAGNOSIS — Z833 Family history of diabetes mellitus: Secondary | ICD-10-CM

## 2021-05-03 DIAGNOSIS — Z853 Personal history of malignant neoplasm of breast: Secondary | ICD-10-CM | POA: Diagnosis not present

## 2021-05-03 DIAGNOSIS — C3492 Malignant neoplasm of unspecified part of left bronchus or lung: Secondary | ICD-10-CM | POA: Diagnosis present

## 2021-05-03 DIAGNOSIS — G609 Hereditary and idiopathic neuropathy, unspecified: Secondary | ICD-10-CM | POA: Diagnosis present

## 2021-05-03 DIAGNOSIS — D496 Neoplasm of unspecified behavior of brain: Secondary | ICD-10-CM | POA: Diagnosis not present

## 2021-05-03 DIAGNOSIS — Z483 Aftercare following surgery for neoplasm: Secondary | ICD-10-CM | POA: Diagnosis not present

## 2021-05-03 LAB — CBC WITH DIFFERENTIAL/PLATELET
Abs Immature Granulocytes: 0.1 10*3/uL — ABNORMAL HIGH (ref 0.00–0.07)
Basophils Absolute: 0 10*3/uL (ref 0.0–0.1)
Basophils Relative: 0 %
Eosinophils Absolute: 0 10*3/uL (ref 0.0–0.5)
Eosinophils Relative: 0 %
HCT: 38.3 % (ref 36.0–46.0)
Hemoglobin: 12.9 g/dL (ref 12.0–15.0)
Immature Granulocytes: 1 %
Lymphocytes Relative: 12 %
Lymphs Abs: 1.3 10*3/uL (ref 0.7–4.0)
MCH: 31.1 pg (ref 26.0–34.0)
MCHC: 33.7 g/dL (ref 30.0–36.0)
MCV: 92.3 fL (ref 80.0–100.0)
Monocytes Absolute: 0.8 10*3/uL (ref 0.1–1.0)
Monocytes Relative: 8 %
Neutro Abs: 8 10*3/uL — ABNORMAL HIGH (ref 1.7–7.7)
Neutrophils Relative %: 79 %
Platelets: 285 10*3/uL (ref 150–400)
RBC: 4.15 MIL/uL (ref 3.87–5.11)
RDW: 12.5 % (ref 11.5–15.5)
WBC: 10.2 10*3/uL (ref 4.0–10.5)
nRBC: 0 % (ref 0.0–0.2)

## 2021-05-03 LAB — GLUCOSE, CAPILLARY
Glucose-Capillary: 96 mg/dL (ref 70–99)
Glucose-Capillary: 109 mg/dL — ABNORMAL HIGH (ref 70–99)
Glucose-Capillary: 109 mg/dL — ABNORMAL HIGH (ref 70–99)
Glucose-Capillary: 118 mg/dL — ABNORMAL HIGH (ref 70–99)

## 2021-05-03 LAB — COMPREHENSIVE METABOLIC PANEL
ALT: 61 U/L — ABNORMAL HIGH (ref 0–44)
AST: 30 U/L (ref 15–41)
Albumin: 2.9 g/dL — ABNORMAL LOW (ref 3.5–5.0)
Alkaline Phosphatase: 74 U/L (ref 38–126)
Anion gap: 7 (ref 5–15)
BUN: 15 mg/dL (ref 8–23)
CO2: 25 mmol/L (ref 22–32)
Calcium: 8.5 mg/dL — ABNORMAL LOW (ref 8.9–10.3)
Chloride: 105 mmol/L (ref 98–111)
Creatinine, Ser: 0.57 mg/dL (ref 0.44–1.00)
GFR, Estimated: >60 mL/min (ref >60)
Glucose, Bld: 118 mg/dL — ABNORMAL HIGH (ref 70–99)
Potassium: 4.2 mmol/L (ref 3.5–5.1)
Sodium: 137 mmol/L (ref 135–145)
Total Bilirubin: 0.4 mg/dL (ref 0.3–1.2)
Total Protein: 5.7 g/dL — ABNORMAL LOW (ref 6.5–8.1)

## 2021-05-03 LAB — MAGNESIUM: Magnesium: 2.1 mg/dL (ref 1.7–2.4)

## 2021-05-03 LAB — PHOSPHORUS: Phosphorus: 3.3 mg/dL (ref 2.5–4.6)

## 2021-05-03 MED ORDER — POLYETHYLENE GLYCOL 3350 17 G PO PACK: 17.0000 g | PACK | Freq: Every day | ORAL | Status: DC | PRN

## 2021-05-03 MED ORDER — LORAZEPAM 1 MG PO TABS: 1.0000 mg | ORAL_TABLET | Freq: Four times a day (QID) | ORAL | 0 refills | Status: DC | PRN

## 2021-05-03 MED ORDER — INSULIN ASPART 100 UNIT/ML IJ SOLN: 0.0000 [IU] | Freq: Three times a day (TID) | INTRAMUSCULAR | 11 refills | Status: DC

## 2021-05-03 MED ORDER — PANTOPRAZOLE SODIUM 40 MG PO TBEC
40.0000 mg | DELAYED_RELEASE_TABLET | Freq: Every day | ORAL | Status: DC
  Administered 2021-05-04 – 2021-05-16 (×10): 40 mg via ORAL
  Filled 2021-05-03 (×13): qty 1

## 2021-05-03 MED ORDER — INSULIN ASPART 100 UNIT/ML IJ SOLN: 0.0000 [IU] | Freq: Every day | INTRAMUSCULAR | 11 refills | Status: DC

## 2021-05-03 MED ORDER — HYDROCODONE-ACETAMINOPHEN 5-325 MG PO TABS: 1.0000 | ORAL_TABLET | ORAL | 0 refills | Status: DC | PRN

## 2021-05-03 MED ORDER — POLYETHYLENE GLYCOL 3350 17 G PO PACK: 17.0000 g | PACK | Freq: Every day | ORAL | 0 refills | Status: DC | PRN

## 2021-05-03 MED ORDER — INSULIN ASPART 100 UNIT/ML IJ SOLN
0.0000 [IU] | Freq: Three times a day (TID) | INTRAMUSCULAR | Status: DC
Start: 2021-05-04 — End: 2021-05-16
  Administered 2021-05-07: 2 [IU] via SUBCUTANEOUS
  Administered 2021-05-09: 1 [IU] via SUBCUTANEOUS
  Administered 2021-05-11: 2 [IU] via SUBCUTANEOUS
  Administered 2021-05-12: 1 [IU] via SUBCUTANEOUS
  Administered 2021-05-14: 2 [IU] via SUBCUTANEOUS

## 2021-05-03 MED ORDER — HYDROCODONE-ACETAMINOPHEN 5-325 MG PO TABS: 1.0000 | ORAL_TABLET | ORAL | Status: DC | PRN

## 2021-05-03 MED ORDER — DEXAMETHASONE 2 MG PO TABS
2.0000 mg | ORAL_TABLET | Freq: Two times a day (BID) | ORAL | Status: DC
  Administered 2021-05-03 – 2021-05-16 (×26): 2 mg via ORAL
  Filled 2021-05-03 (×26): qty 1

## 2021-05-03 MED ORDER — LEVETIRACETAM 1000 MG PO TABS: 1000.0000 mg | ORAL_TABLET | Freq: Two times a day (BID) | ORAL | Status: DC

## 2021-05-03 MED ORDER — ROPINIROLE HCL 1 MG PO TABS
4.0000 mg | ORAL_TABLET | Freq: Every day | ORAL | Status: DC
  Administered 2021-05-03 – 2021-05-15 (×13): 4 mg via ORAL
  Filled 2021-05-03 (×13): qty 4

## 2021-05-03 MED ORDER — FLUTICASONE PROPIONATE 50 MCG/ACT NA SUSP
2.0000 | Freq: Every day | NASAL | Status: DC
  Administered 2021-05-04 – 2021-05-16 (×13): 2 via NASAL
  Filled 2021-05-03: qty 16

## 2021-05-03 MED ORDER — PANTOPRAZOLE SODIUM 40 MG PO TBEC: 40.0000 mg | DELAYED_RELEASE_TABLET | Freq: Every day | ORAL | Status: DC

## 2021-05-03 MED ORDER — ONDANSETRON HCL 4 MG/2ML IJ SOLN: 4.0000 mg | INTRAMUSCULAR | Status: DC | PRN

## 2021-05-03 MED ORDER — ROPINIROLE HCL 4 MG PO TABS: 4.0000 mg | ORAL_TABLET | Freq: Every day | ORAL | Status: DC

## 2021-05-03 MED ORDER — GABAPENTIN 300 MG PO CAPS: 600.0000 mg | ORAL_CAPSULE | Freq: Two times a day (BID) | ORAL | Status: DC

## 2021-05-03 MED ORDER — ONDANSETRON HCL 4 MG PO TABS: 4.0000 mg | ORAL_TABLET | ORAL | 0 refills | Status: DC | PRN

## 2021-05-03 MED ORDER — LORAZEPAM 1 MG PO TABS
1.0000 mg | ORAL_TABLET | Freq: Four times a day (QID) | ORAL | Status: DC | PRN
  Administered 2021-05-03 – 2021-05-08 (×4): 1 mg via ORAL
  Filled 2021-05-03 (×4): qty 1

## 2021-05-03 MED ORDER — BUSPIRONE HCL 7.5 MG PO TABS: 7.5000 mg | ORAL_TABLET | Freq: Two times a day (BID) | ORAL | Status: DC | PRN

## 2021-05-03 MED ORDER — GABAPENTIN 300 MG PO CAPS
600.0000 mg | ORAL_CAPSULE | Freq: Two times a day (BID) | ORAL | Status: DC
  Administered 2021-05-03 – 2021-05-16 (×26): 600 mg via ORAL
  Filled 2021-05-03 (×26): qty 2

## 2021-05-03 MED ORDER — PROMETHAZINE HCL 12.5 MG PO TABS: 12.5000 mg | ORAL_TABLET | ORAL | 0 refills | Status: DC | PRN

## 2021-05-03 MED ORDER — ONDANSETRON HCL 4 MG PO TABS: 4.0000 mg | ORAL_TABLET | ORAL | Status: DC | PRN

## 2021-05-03 MED ORDER — DIPHENHYDRAMINE HCL 50 MG PO CAPS: 50.0000 mg | ORAL_CAPSULE | Freq: Once | ORAL | 0 refills | Status: DC

## 2021-05-03 MED ORDER — BUSPIRONE HCL 15 MG PO TABS
7.5000 mg | ORAL_TABLET | Freq: Two times a day (BID) | ORAL | Status: DC | PRN
  Administered 2021-05-10 – 2021-05-12 (×3): 7.5 mg via ORAL
  Filled 2021-05-03 (×3): qty 1

## 2021-05-03 MED ORDER — LEVETIRACETAM 500 MG PO TABS
1000.0000 mg | ORAL_TABLET | Freq: Two times a day (BID) | ORAL | Status: DC
  Administered 2021-05-03 – 2021-05-07 (×8): 1000 mg via ORAL
  Filled 2021-05-03 (×8): qty 2

## 2021-05-03 MED ORDER — DEXAMETHASONE 2 MG PO TABS: 2.0000 mg | ORAL_TABLET | Freq: Two times a day (BID) | ORAL | Status: DC

## 2021-05-03 NOTE — Progress Notes (Signed)
Izora Ribas, MD   Physician  Physical Medicine and Rehabilitation  PMR Pre-admission     Signed  Date of Service:  05/01/2021  1:37 PM       Related encounter: ED to Hosp-Admission (Current) from 04/24/2021 in [facility]      Show:Clear all [x] Written[x] Templated[x] Copied  Added by: [x] Cristina Gong, RN[x] Raulkar, Clide Deutscher, MD  [] Hover for details                                                                                                                                                                                                                                                                                                                                                                                                                                        PMR Admission Coordinator Pre-Admission Assessment   Patient: Tara Murphy is an 75 y.o., female MRN: [id number] DOB: Jun 18, 1946 Height: 5' 6"  (167.6 cm) Weight: 94.3 kg   Insurance Information HMO:     PPO:      PCP:      IPA:      80/20:      OTHER:  PRIMARY: Medicare a and b      Policy#: [id number]      Subscriber: pt Benefits:  Phone #: passport one source     Name: 05/01/21 Eff. Date: a 12/23/2011  and b 01/21/2014     Deduct: $1556      Out of Pocket Max: none      Life Max: none CIR: 100%      SNF: 20 full days Outpatient: 80%     Co-Pay: 20% Home Health: 100%      Co-Pay: none DME: 80%     Co-Pay: 20% Providers: pt choice  SECONDARY: AARP medicare supplement      Policy#: [id number]   Financial Counselor:       Phone#:    The "Data Collection Information Summary" for patients in Inpatient Rehabilitation Facilities with attached "Privacy Act [place] Records" was provided and verbally reviewed with: Patient    Emergency Contact Information Contact Information       Name Relation Home Work [place] Spouse [phone]   [phone]    Sowell,Shannon Daughter [phone]   [phone]           Current Medical History  Patient Admitting Diagnosis: s/p craniotomy for resection of tumor   History of Present Illness: 75 year old right-handed female with history of left breast cancer 2015 with radiation therapy, Hepatic steatosis intermittently elevated since 2018, right total hip and left total knee arthroplasty, quit smoking 29 years ago, history of recurrent UTIs, depression, 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  Per chart review lives with spouse.  1 level home one-step to entry.  Ambulates household distances with the use of a Rollator and has had a history of falls that began in February but was independent prior.  Presented 04/30/2021 after seizure-like activity left side jerking movements.  CT/MRI of the head showed a hemorrhagic lesion of the paramedian superior right frontal lobe measuring 2.6 x 1.6 cm.  There is moderate surrounding vasogenic edema.  The midline structures were normal.  Findings most consistent with solitary metastasis.  CT of the chest showed a large spiculated left upper lobe suprahilar mass consistent with primary bronchogenic malignancy.  EEG was negative for seizure.  She had been loaded with Keppra and remains on 1000 mg twice daily.  Patient underwent bilateral parietal craniotomy with resection of brain tumor microdissection intraoperative stereotactic navigation for volumetric resection 04/30/2021 per Dr. Annette Stable.  Decadron protocol as indicated.  Pathology consistent with metastatic lung carcinoma and neurosurgery plans to discuss with radiation oncology in regards to postoperative SRS treatment.  Tolerating a regular consistency diet.  Therapy evaluations completed due to patient decreased functional  mobility was admitted for a comprehensive rehab program.   Patient's medical record from [facility]  has been reviewed by the rehabilitation admission coordinator and physician.   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[facility])      S/p total hip replacement left   Breast cancer ([facility]) 2015    left    Cataract     Cholecystolithiasis     Chronic allergic rhinitis     Depression     Former smoker, stopped smoking in distant past 10/05/2019    Quit 2000; 61 y smoking history   GERD (gastroesophageal reflux disease)     Hepatic steatosis 06/08/2020    Intermittent elevated LFTs and ultrasound 2018   Hyperlipemia     IBS (irritable bowel syndrome) 10/08/2018    Nl colonoscopy and EGD 09/2018   IBS (irritable bowel syndrome)  Lichen plano-pilaris     Major depression, chronic 09/05/2015   Neuropathy, peripheral, idiopathic      uses gabapentin   Osteopenia 09/07/2019    Dexa 08/2019: T = -1.2 at wrist; back and hips excluded (DJD and hardware).    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 Repeat 2020   Urinary tract infection      hx of frequent       Family History   family history includes Arthritis in her father and mother; Breast cancer (age of onset: 25) in her sister; Crohn's disease in her mother; Diabetes in her mother; Hyperlipidemia in her maternal aunt; Lung cancer in her father; Stomach cancer in her maternal grandmother.   Prior Rehab/Hospitalizations Has the patient had prior rehab or hospitalizations prior to admission? Yes   Has the patient had major surgery during 100 days prior to admission? Yes              Current Medications   Current Facility-Administered Medications:    busPIRone (BUSPAR) tablet 7.5 mg, 7.5 mg, Oral, BID PRN, Allie Bossier, MD    Chlorhexidine Gluconate Cloth 2 % PADS 6 each, 6 each, Topical, Daily, Earnie Larsson, MD, 6 each at 05/01/21 1300   dexamethasone (DECADRON) tablet 2 mg, 2 mg, Oral, Q12H, Pool, Mallie Mussel, MD, 2 mg at 05/03/21 0905   diphenhydrAMINE (BENADRYL) capsule 50 mg, 50 mg, Oral, Once **OR** diphenhydrAMINE (BENADRYL) injection 50 mg, 50 mg, Intravenous, Once, Earnie Larsson, MD   fluticasone (FLONASE) 50 MCG/ACT nasal spray 2 spray, 2 spray, Each Nare, Daily, Pool, Mallie Mussel, MD, 2 spray at 05/02/21 0830   gabapentin (NEURONTIN) capsule 600 mg, 600 mg, Oral, BID, Earnie Larsson, MD, 600 mg at 05/03/21 0905   gabapentin (NEURONTIN) capsule 800 mg, 800 mg, Oral, Once, Earnie Larsson, MD   HYDROcodone-acetaminophen (NORCO/VICODIN) 5-325 MG per tablet 1 tablet, 1 tablet, Oral, Q4H PRN, Earnie Larsson, MD, 1 tablet at 05/02/21 2135   insulin aspart (novoLOG) injection 0-5 Units, 0-5 Units, Subcutaneous, QHS, Pool, Mallie Mussel, MD, 2 Units at 04/29/21 2247   insulin aspart (novoLOG) injection 0-9 Units, 0-9 Units, Subcutaneous, TID WC, Earnie Larsson, MD, 2 Units at 05/01/21 1615   labetalol (NORMODYNE) injection 10-40 mg, 10-40 mg, Intravenous, Q10 min PRN, Earnie Larsson, MD   levETIRAcetam (KEPPRA) tablet 1,000 mg, 1,000 mg, Oral, BID, Earnie Larsson, MD, 1,000 mg at 05/03/21 0905   LORazepam (ATIVAN) injection 2 mg, 2 mg, Intravenous, Q4H PRN, Earnie Larsson, MD   LORazepam (ATIVAN) tablet 1 mg, 1 mg, Oral, Q6H PRN, Earnie Larsson, MD, 1 mg at 05/02/21 2135   morphine 2 MG/ML injection 1-2 mg, 1-2 mg, Intravenous, Q2H PRN, Earnie Larsson, MD, 2 mg at 04/30/21 2345   naloxone (NARCAN) injection 0.08 mg, 0.08 mg, Intravenous, PRN, Earnie Larsson, MD   ondansetron (ZOFRAN) tablet 4 mg, 4 mg, Oral, Q4H PRN **OR** ondansetron (ZOFRAN) injection 4 mg, 4 mg, Intravenous, Q4H PRN, Pool, Mallie Mussel, MD   pantoprazole (PROTONIX) EC tablet 40 mg, 40 mg, Oral, Daily, Nicole Kindred A, DO, 40 mg at 05/03/21 0905   polyethylene glycol (MIRALAX / GLYCOLAX) packet 17 g, 17 g,  Oral, Daily PRN, Earnie Larsson, MD, 17 g at 05/02/21 0830   promethazine (PHENERGAN) tablet 12.5-25 mg, 12.5-25 mg, Oral, Q4H PRN, Earnie Larsson, MD   rOPINIRole (REQUIP) tablet 4 mg, 4 mg, Oral, QHS, Pool, Mallie Mussel, MD, 4 mg at 05/02/21  2129   Patients Current Diet:  Diet Order                  Diet - low sodium heart healthy             Diet Carb Modified Fluid consistency: Thin; Room service appropriate? Yes  Diet effective now                         Precautions / Restrictions Precautions Precautions: Fall, Other (comment) (aspiration) Precaution Comments: seizure, L foot drop Other Brace: AFO in room Restrictions Weight Bearing Restrictions: No Other Position/Activity Restrictions: L foot drop    Has the patient had 2 or more falls or a fall with injury in the past year? Yes   Prior Activity Level Limited Community (1-2x/wk): Independent prior to 2/22; assisted with adls since February   Prior Functional Level Self Care: Did the patient need help bathing, dressing, using the toilet or eating? Needed some help   Indoor Mobility: Did the patient need assistance with walking from room to room (with or without device)? Needed some help   Stairs: Did the patient need assistance with internal or external stairs (with or without device)? Needed some help   Functional Cognition: Did the patient need help planning regular tasks such as shopping or remembering to take medications? Independent   Home Assistive Devices / Equipment Home Assistive Devices/Equipment: Environmental consultant (specify type) Home Equipment: Walker - 4 wheels, Grab bars - toilet, Shower seat - built in, [employer] equipment   Prior Device Use: Indicate devices/aids used by the patient prior to current illness, exacerbation or injury? Walker   Current Functional Level Cognition   Overall Cognitive Status: Within Functional Limits for tasks assessed Current Attention Level: Sustained Orientation Level: Oriented  X4 Following Commands: Follows one step commands with increased time Safety/Judgement: Decreased awareness of deficits General Comments: Followed one step commands accurately    Extremity Assessment (includes Sensation/Coordination)   Upper Extremity Assessment: Overall WFL for tasks assessed (denies changes) LUE Deficits / Details: 4 / 5 MMT noted LUE Sensation: WNL LUE Coordination: decreased fine motor, decreased gross motor  Lower Extremity Assessment: Defer to PT evaluation LLE Deficits / Details: Hip flexion 2/5, knee extension 5/5, ankle dorsiflexion 0/5 LLE Sensation: WNL     ADLs   Overall ADL's : Needs assistance/impaired Eating/Feeding: Set up, Sitting Grooming: Wash/dry hands, Wash/dry face, Set up, Bed level Upper Body Dressing : Minimal assistance, Bed level Lower Body Dressing: Moderate assistance, Sit to/from stand Lower Body Dressing Details (indicate cue type and reason): able to figure 4 both sides but needs (A) with L LE. pt able to don doff socks bil LE. pt requires (A) to don L shoe with max (A). Pt uses shoe horn at baseline. p Toilet Transfer: +2 for physical assistance, Ambulation, Minimal assistance, RW Toilet Transfer Details (indicate cue type and reason): simulated with chair and eob Functional mobility during ADLs: +2 for physical assistance, Moderate assistance     Mobility   Overal bed mobility: Needs Assistance Bed Mobility: Supine to Sit Supine to sit: HOB elevated, Min assist Sit to supine: Mod assist General bed mobility comments: Performed supine to sit with A from daughter. Initiated well and scooted hips EOB with supervision.     Transfers   Overall transfer level: Needs assistance Equipment used: Rolling walker (2 wheeled) Transfers: Sit to/from Stand Sit to Stand: Mod assist, +2 physical assistance Stand pivot transfers: Mod assist, +2  safety/equipment General transfer comment: Mod A +2 for sit to stand to power up to standing with  verbal cueing provided for hand placement on bed and RW.     Ambulation / Gait / Stairs / Wheelchair Mobility   Ambulation/Gait Ambulation/Gait assistance: Mod assist, +2 safety/equipment Gait Distance (Feet): 25 Feet Assistive device: Rolling walker (2 wheeled) Gait Pattern/deviations: Step-to pattern, Decreased dorsiflexion - left, Step-through pattern, Decreased stance time - left, Trendelenburg, Narrow base of support General Gait Details: Pt ambulated 82f with Mod A +2 for safety and chair follow from daughter. Verbal cues provided for sequencing, postural adjustments, and use of RW. Gait velocity: Decreased Gait velocity interpretation: <1.8 ft/sec, indicate of risk for recurrent falls     Posture / Balance Dynamic Sitting Balance Sitting balance - Comments: Able to weight shift to don shoes and AFO with A from shoe horn Balance Overall balance assessment: Needs assistance Sitting-balance support: Feet supported, No upper extremity supported Sitting balance-Leahy Scale: Good Sitting balance - Comments: Able to weight shift to don shoes and AFO with A from shoe horn Standing balance support: Bilateral upper extremity supported Standing balance-Leahy Scale: Poor Standing balance comment: BUE and external support     Special needs/care consideration Fall precautions and she has fear of following since has had multiple falls in past few months New diagnosis of cancer this admission                                                               Previous Home Environment  Living Arrangements: Spouse/significant other  Lives With: Spouse Available Help at Discharge: Family, Available 24 hours/day Type of Home: House Home Layout: One level Home Access: Stairs to enter Entrance Stairs-Rails: None Entrance Stairs-Number of Steps: 1 Bathroom Shower/Tub: WMultimedia programmer Standard Bathroom Accessibility: Yes How Accessible: Accessible via walker Home Care Services: No    Discharge Living Setting Plans for Discharge Living Setting: Patient's home, Lives with (comment) (spouse) Type of Home at Discharge: House Discharge Home Layout: One level Discharge Home Access: Stairs to enter Entrance Stairs-Rails: None Entrance Stairs-Number of Steps: 1 Discharge Bathroom Shower/Tub: WHorticulturist, commercial Standard Discharge Bathroom Accessibility: Yes How Accessible: Accessible via walker Does the patient have any problems obtaining your medications?: No   Social/Family/Support Systems Patient Roles: Spouse Contact Information: daughter, SLarene Beachmain contact for spouse very HOH Anticipated Caregiver: spouse and daughter Anticipated Caregiver's Contact Information: see above Ability/Limitations of Caregiver: ELOS 10 to 12 days Caregiver Availability: 24/7 Discharge Plan Discussed with Primary Caregiver: Yes Is Caregiver In Agreement with Plan?: Yes Does Caregiver/Family have Issues with Lodging/Transportation while Pt is in Rehab?: No   Spouse HOH and daughter, SLarene Beachis her health care POA. Pt's spouse is stepfather to SMeeteetseof 382years. There is some tension between two daughters and stepfather.   Goals Patient/Family Goal for Rehab: supervision PT, supervision to min OT Expected length of stay: ELOS 10 to 12 days Pt/Family Agrees to Admission and willing to participate: Yes Program Orientation Provided & Reviewed with Pt/Caregiver Including Roles  & Responsibilities: Yes   Decrease burden of Care through IP rehab admission: n/a   Possible need for SNF placement upon discharge: not anticipated   Patient Condition: [facility]  spoken with CM, and patient and spouse. I met with patient at the bedside for inpatient rehabilitation assessment.  Patient will benefit from ongoing PT and OT, can actively participate in 3 hours of therapy a day 5 days of the week, and can make measurable gains  during the admission.  Patient will also benefit from the coordinated team approach during an Inpatient Acute Rehabilitation admission.  The patient will receive intensive therapy as well as Rehabilitation physician, nursing, social worker, and care management interventions.  Due to bladder management, bowel management, safety, skin/wound care, disease management, medication administration, pain management, and patient education the patient requires 24 hour a day rehabilitation nursing.  The patient is currently mod assist overall with mobility and basic ADLs.  Discharge setting and therapy post discharge at home with home health is anticipated.  Patient has agreed to participate in the Acute Inpatient Rehabilitation Program and will admit today.   Preadmission Screen Completed By:  Cleatrice Burke, 05/03/2021 10:47 AM ______________________________________________________________________   Discussed status with Dr. Ranell Patrick on 05/03/2021 at 1048 and received approval for admission today.   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 Yes Co-Morbidities requiring supervision/potential complications: seizure, GERD, OA of knee. 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 Yes Does the patient require coordinated care of a physician, rehab nurse, PT, OT to address physical and functional deficits in the context of the above medical diagnosis(es)? Yes Addressing deficits in the  following areas: balance, endurance, locomotion, strength, transferring, bowel/bladder control, bathing, dressing, feeding, grooming, toileting, and psychosocial support Can the patient actively participate in an intensive therapy program of at least 3 hrs of therapy 5 days a week? Yes The potential for patient to make measurable gains while on inpatient rehab is excellent Anticipated functional outcomes upon discharge from inpatient rehab: modified independent PT, modified independent OT, independent SLP Estimated rehab length of stay to reach the above functional goals is: 10-14 days Anticipated discharge destination: Home 10. Overall Rehab/Functional Prognosis: excellent     MD Signature: Leeroy Cha, MD         Revision History                               Note Details  Author Izora Ribas, MD File Time 05/03/2021 10:58 AM  Author Type Physician Status Signed  Last Editor Izora Ribas, MD Service Physical Medicine and Rehabilitation

## 2021-05-03 NOTE — Progress Notes (Signed)
No new problems or changes overnight.  Patient continues to recover well.  Left hemiparesis stable.  Wound clean and dry.  No headaches.  Pathology consistent with metastatic lung carcinoma.  Patient cleared for admission to rehabilitation.  She will need postoperative SRS.  I have contacted our radiation oncology coordinator and she states she will contact the lung tumor coordinator as well.

## 2021-05-03 NOTE — Progress Notes (Signed)
Inpatient Rehabilitation Admissions Coordinator   I have CIR bed to admit patient to today. I met with patient , her spouse and daughter, Larene Beach, at bedside . They are in agreement to admit. I will alert acute team and TOC to make the arrangements.  Danne Baxter, RN, MSN Rehab Admissions Coordinator [phone] 05/03/2021 10:46 AM

## 2021-05-03 NOTE — Discharge Summary (Signed)
Physician Discharge Summary  [name] GYJ:[id number] DOB: 20-Jun-1946 DOA: 04/24/2021  PCP: Leamon Arnt, MD  Admit date: 04/24/2021 Discharge date: 05/03/2021  Admitted From: home Disposition:  CIR  Recommendations for Outpatient Follow-up:  Follow up with PCP in 1-2 weeks Please obtain BMP/CBC in one week Please follow up with neurosurgery as scheduled. Follow up with medical and radiation oncology   Home Health: No  Equipment/Devices: none   Discharge Condition: stable  CODE STATUS: full  Diet recommendation: Carb Modified while on steroids, then regular diet   Discharge Diagnoses: Active Problems:   GERD (gastroesophageal reflux disease)   Seizure (HCC)   Brain mass    Summary of HPI and Hospital Course:  Tara Murphy is a 75 y.o. female with PMHx of chronic anxiety/depression, LEFT breast cancer status post lumpectomy in 2015, left foot drop, EMG diagnosis of peripheral neuropathy, HLD, GERD, herpes zoster, former smoker, restless leg syndrome, IBS.  She presented to the ED after seizure-like activity at home, witness by husband.   She was found to have a 2x2cm brain mass with hemorrhagic posterior right frontal lobe mass and associated vasogenic edema.  Started on Keppra.  Neurosurgery was consulted, and patient underwent bilateral parietal craniotomy with resection of the brain mass.  CT chest/abdomen/pelvis showed a spiculated left upper lobe lung mass, suspicious for primary malignancy.  There is also a 8 mm lung nodule on the right.  No other findings of malignancy in the chest abdomen pelvis.   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 No further seizure activity has been noted since admission. Tissue pathology consistent with metastatic lung adenocarcinoma.   -- Antiepileptics per neurology:  --Continue  Keppra --Loaded with Dilantin -- Continue IV steroids -- Seizure precautions   Left upper lobe lung adenocarcinoma -  -- Oncology consulted, appreciate recommendations -- Follow up pending [facility] One and PDL-1 testing -- Medical and radiation oncology follow-up outpatient   Elevated BP - covered with PRN labetalol, has not required.  BP was likely up due to pain and anxiety.   BP improved.  Monitor.   Chronic anxiety/depression - on BuSpar prior to admission, continued.  Started on Xanax as needed in this acute setting, would avoid long-term benzo use.   Ambulatory dysfunction -secondary to generalized weakness, acute issues above and chronic foot drop.  Patient has had multiple recent falls at home prior to admission. -- CIR discharge today for ongoing rehab -- Fall precautions   Obesity: Body mass index is 33.57 kg/m. Complicates overall care and prognosis.  Recommend lifestyle modifications including physical activity and diet for weight loss and overall long-term health.    Discharge Instructions   Discharge Instructions     Call MD for:  extreme fatigue   Complete by: As directed    Call MD for:  persistant dizziness or light-headedness   Complete by: As directed    Call MD for:  persistant nausea and vomiting   Complete by: As directed    Call MD for:  redness, tenderness, or signs of infection (pain, swelling, redness, odor or green/yellow discharge around incision site)   Complete by: As directed    Call MD for:  severe uncontrolled pain   Complete by: As directed    Call MD for:  temperature >100.4   Complete by: As directed    Diet - low sodium heart healthy  Complete by: As directed    Discharge instructions   Complete by: As directed    Oncology will be following up with you to discuss treatment options and getting that scheduled.   Discharge wound care:   Complete by: As directed    Cleanse head daily and monitor incision for any drainage or  surrounding redness or swelling.   Neurosurgery follow up for staple removal as instructed.  They will coordinate follow up in their office.   Increase activity slowly   Complete by: As directed       Allergies as of 05/03/2021       Reactions   Fluorescein Itching, Other (See Comments)   Contrast Media [iodinated Diagnostic Agents] Hives   Hydromorphone Hcl Nausea Only   Other Rash   Certain Band-Aids don't agree with the patient's skin; hospital ID wristband causes rashes   Sulfamethoxazole-trimethoprim Hives   Fentanyl Other (See Comments)   Redness and flushing over body    Tape Rash   Certain Band-Aids don't agree with the patient's skin; hospital ID wristband causes rashes   Terbinafine And Related Hives, Rash        Medication List     STOP taking these medications    clobetasol 0.05 % external solution Commonly known as: TEMOVATE   traMADol 50 MG tablet Commonly known as: ULTRAM       TAKE these medications    busPIRone 7.5 MG tablet Commonly known as: BUSPAR Take 1 tablet (7.5 mg total) by mouth 2 (two) times daily as needed (anxiety).   dexamethasone 2 MG tablet Commonly known as: DECADRON Take 1 tablet (2 mg total) by mouth every 12 (twelve) hours.   diphenhydrAMINE 50 MG capsule Commonly known as: BENADRYL Take 1 capsule (50 mg total) by mouth once for 1 dose.   fluticasone 50 MCG/ACT nasal spray Commonly known as: FLONASE Place 2 sprays into both nostrils daily.   gabapentin 300 MG capsule Commonly known as: NEURONTIN Take 2 capsules (600 mg total) by mouth 2 (two) times daily.   HYDROcodone-acetaminophen 5-325 MG tablet Commonly known as: NORCO/VICODIN Take 1 tablet by mouth every 4 (four) hours as needed for moderate pain.   insulin aspart 100 UNIT/ML injection Commonly known as: novoLOG Inject 0-9 Units into the skin 3 (three) times daily with meals.   insulin aspart 100 UNIT/ML injection Commonly known as: novoLOG Inject 0-5 Units  into the skin at bedtime.   levETIRAcetam 1000 MG tablet Commonly known as: KEPPRA Take 1 tablet (1,000 mg total) by mouth 2 (two) times daily.   LORazepam 1 MG tablet Commonly known as: ATIVAN Take 1 tablet (1 mg total) by mouth every 6 (six) hours as needed for anxiety, seizure or sleep.   ondansetron 4 MG tablet Commonly known as: ZOFRAN Take 1 tablet (4 mg total) by mouth every 4 (four) hours as needed for nausea or vomiting.   pantoprazole 40 MG tablet Commonly known as: PROTONIX Take 1 tablet (40 mg total) by mouth daily. Start taking on: May 04, 2021   polyethylene glycol 17 g packet Commonly known as: MIRALAX / GLYCOLAX Take 17 g by mouth daily as needed for mild constipation.   promethazine 12.5 MG tablet Commonly known as: PHENERGAN Take 1-2 tablets (12.5-25 mg total) by mouth every 4 (four) hours as needed for refractory nausea / vomiting.   rOPINIRole 4 MG tablet Commonly known as: REQUIP Take 1 tablet (4 mg total) by mouth at bedtime.  Discharge Care Instructions  (From admission, onward)           Start     Ordered   05/03/21 0000  Discharge wound care:       Comments: Cleanse head daily and monitor incision for any drainage or surrounding redness or swelling.   Neurosurgery follow up for staple removal as instructed.  They will coordinate follow up in their office.   05/03/21 1023            Allergies  Allergen Reactions   Fluorescein Itching and Other (See Comments)   Contrast Media [Iodinated Diagnostic Agents] Hives   Hydromorphone Hcl Nausea Only   Other Rash    Certain Band-Aids don't agree with the patient's skin; hospital ID wristband causes rashes   Sulfamethoxazole-Trimethoprim Hives   Fentanyl Other (See Comments)    Redness and flushing over body    Tape Rash    Certain Band-Aids don't agree with the patient's skin; hospital ID wristband causes rashes   Terbinafine And Related Hives and Rash     If you  experience worsening of your admission symptoms, develop shortness of breath, life threatening emergency, suicidal or homicidal thoughts you must seek medical attention immediately by calling 911 or calling your MD immediately  if symptoms less severe.    Please note   You were cared for by a hospitalist during your hospital stay. If you have any questions about your discharge medications or the care you received while you were in the hospital after you are discharged, you can call the unit and asked to speak with the hospitalist on call if the hospitalist that took care of you is not available. Once you are discharged, your primary care physician will handle any further medical issues. Please note that NO REFILLS for any discharge medications will be authorized once you are discharged, as it is imperative that you return to your primary care physician (or establish a relationship with a primary care physician if you do not have one) for your aftercare needs so that they can reassess your need for medications and monitor your lab values.   Consultations: Neurosurgery Neurology Oncology    Procedures/Studies: DG Knee 2 Views Left  Result Date: 04/17/2021 CLINICAL DATA:  Left knee pain. The patient reports multiple falls since yesterday. Initial encounter. EXAM: LEFT KNEE - 1-2 VIEW COMPARISON:  None. FINDINGS: There is no acute bony or joint abnormality. Total knee arthroplasty is in place. Hardware is intact without complicating feature. Soft tissues are negative. IMPRESSION: No acute abnormality.  Status post left knee replacement. Electronically Signed   By: Inge Rise M.D.   On: 04/17/2021 20:37   DG Ankle Complete Left  Result Date: 04/17/2021 CLINICAL DATA:  Left ankle pain. The patient reports multiple falls since yesterday. Initial encounter. EXAM: LEFT ANKLE COMPLETE - 3+ VIEW COMPARISON:  None. FINDINGS: There is no evidence of fracture, dislocation, or joint effusion. There is no  evidence of arthropathy or other focal bone abnormality. Small plantar calcaneal spur incidentally noted. Soft tissues are unremarkable. IMPRESSION: Negative exam. Electronically Signed   By: Inge Rise M.D.   On: 04/17/2021 20:34   CT HEAD WO CONTRAST (5MM)  Result Date: 04/24/2021 CLINICAL DATA:  Seizure. EXAM: CT HEAD WITHOUT CONTRAST TECHNIQUE: Contiguous axial images were obtained from the base of the skull through the vertex without intravenous contrast. COMPARISON:  None. FINDINGS: Brain: There is mild cerebral atrophy with widening of the extra-axial spaces and ventricular dilatation. There are  areas of decreased attenuation within the white matter tracts of the supratentorial brain, consistent with microvascular disease changes. A 2.2 cm x 1.9 cm x 2.2 cm well-defined low-attenuation white matter mass (approximately 22.91 Hounsfield units) is seen within the parasagittal posterior right frontal lobe, along the vertex (axial CT images 25 through 29, CT series 3). This contains a small hemorrhagic component which measures approximately 1.2 cm x 1.0 cm x 0.6 cm). A moderate to marked amount of surrounding white matter vasogenic edema is seen. Mass effect is seen on the adjacent sulci without evidence of midline shift. Vascular: No hyperdense vessel or unexpected calcification. Skull: Normal. Negative for fracture or focal lesion. Sinuses/Orbits: No acute finding. Other: None. IMPRESSION: 1. Hemorrhagic posterior right frontal lobe mass and associated vasogenic edema, as described above. MRI correlation is recommended. Electronically Signed   By: Virgina Norfolk M.D.   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 EXAM: MRI HEAD WITHOUT AND WITH CONTRAST TECHNIQUE: Multiplanar, multiecho pulse sequences of the brain and surrounding structures were obtained without and with intravenous  contrast. CONTRAST:  78m GADAVIST GADOBUTROL 1 MMOL/ML IV SOLN COMPARISON:  04/28/2021.  04/24/2021. FINDINGS: Brain: Interval right frontoparietal vertex craniotomy for resection of a cystic hemorrhagic metastasis at the vertex. Gross total resection of the mass. Blood products within the post resection space, which measures 16 mm, compared with the lesion size of 25 mm. Enhancement along the margins of the resection is not unexpected and will need to be followed for evidence of regression or progression. Regional vasogenic edema appears similar. No new brain lesion is seen. No evidence of perioperative infarction. No hydrocephalus. No extra-axial collection. Vascular: Major vessels at the base of the brain show flow. Superior sagittal sinus remains patent. Skull and upper cervical spine: Otherwise negative Sinuses/Orbits: Clear/normal Other: None IMPRESSION: Gross total resection of the hemorrhagic cystic metastasis at the right brain vertex. No operative complication is discernible. Regional edema appears similar. Linear enhancement along the margins of the post resection space will need to be followed for evidence regression or progression. Electronically Signed   By: MNelson ChimesM.D.   On: 05/01/2021 07:46   MR BRAIN W WO CONTRAST  Result Date: 04/28/2021 CLINICAL DATA:  Left lower extremity weakness. Breast carcinoma. Brain lab protocol. EXAM: MRI HEAD WITHOUT AND WITH CONTRAST TECHNIQUE: Multiplanar, multiecho pulse sequences of the brain and surrounding structures were obtained without and with intravenous contrast. CONTRAST:  9.454mGADAVIST GADOBUTROL 1 MMOL/ML IV SOLN COMPARISON:  Brain MRI 04/24/2021 FINDINGS: Brain: Unchanged appearance of hemorrhagic lesion in the paramedian right frontal lobe with surrounding edema. Blood fluid level within the lesion. No midline shift. Vascular: Major flow voids are preserved. Skull and upper cervical spine: Normal calvarium and skull base. Visualized upper  cervical spine and soft tissues are normal. Sinuses/Orbits:No paranasal sinus fluid levels or advanced mucosal thickening. No mastoid or middle ear effusion. Normal orbits. IMPRESSION: BrainLAB protocol showing unchanged appearance of hemorrhagic lesion in the paramedian right frontal lobe with surrounding edema. Electronically Signed   By: KeUlyses Jarred.D.   On: 04/28/2021 20:09   MR Brain W and Wo Contrast  Result Date: 04/25/2021 CLINICAL DATA:  Cancer of unknown primary, staging EXAM: MRI HEAD WITHOUT AND WITH CONTRAST TECHNIQUE: Multiplanar, multiecho pulse sequences of the brain and surrounding structures were obtained without and with intravenous contrast. CONTRAST:  1081mADAVIST GADOBUTROL 1 MMOL/ML  IV SOLN COMPARISON:  None. FINDINGS: Brain: Hemorrhagic lesion of the paramedian superior right frontal lobe measures 2.6 x 1.6 cm. There is moderate surrounding vasogenic edema. There is a hematocrit level within the lesion. Normal white matter signal, parenchymal volume and CSF spaces. The midline structures are normal. Contrast enhancement of the above described lesion is predominantly peripheral. There are no other contrast-enhancing lesions. Vascular: Major flow voids are preserved. Skull and upper cervical spine: Normal calvarium and skull base. Visualized upper cervical spine and soft tissues are normal. Sinuses/Orbits:No paranasal sinus fluid levels or advanced mucosal thickening. No mastoid or middle ear effusion. Normal orbits. IMPRESSION: Hemorrhagic lesion of the paramedian superior right frontal lobe with moderate surrounding vasogenic edema. This is most consistent with a solitary metastasis. Electronically Signed   By: Ulyses Jarred M.D.   On: 04/25/2021 00:15   CT CHEST ABDOMEN PELVIS W CONTRAST  Result Date: 04/25/2021 CLINICAL DATA:  Cancer of unknown primary, staging, history of left breast cancer in 2015, intracranial mass EXAM: CT CHEST, ABDOMEN, AND PELVIS WITH CONTRAST TECHNIQUE:  Multidetector CT imaging of the chest, abdomen and pelvis was performed following the standard protocol during bolus administration of intravenous contrast. CONTRAST:  176m OMNIPAQUE IOHEXOL 300 MG/ML  SOLN COMPARISON:  None. FINDINGS: CT CHEST FINDINGS Cardiovascular: The heart and great vessels are unremarkable without pericardial effusion. Normal caliber of the thoracic aorta. Prominent atherosclerosis of the coronary vasculature. Mediastinum/Nodes: No pathologic mediastinal, hilar, or axillary adenopathy. Thyroid, trachea, and esophagus are unremarkable. Lungs/Pleura: There is a left upper lobe spiculated suprahilar mass measuring 3.5 x 2.0 by 4.3 cm, consistent with primary bronchogenic neoplasm. There is an indeterminate 8 mm right upper lobe pulmonary nodule image 57/5, concerning for metastatic disease. No other acute airspace disease, effusion, or pneumothorax. Central airways are patent. Musculoskeletal: There are no acute or destructive bony lesions. Reconstructed images demonstrate no additional findings. CT ABDOMEN PELVIS FINDINGS Hepatobiliary: Gallbladder is surgically absent. No focal liver abnormalities. No biliary duct dilation. Pancreas: Unremarkable. No pancreatic ductal dilatation or surrounding inflammatory changes. Spleen: Normal in size without focal abnormality. Adrenals/Urinary Tract: Adrenal glands are unremarkable. Kidneys are normal, without renal calculi, focal lesion, or hydronephrosis. Evaluation of the bladder is limited due to streak artifact from bilateral hip arthroplasties. No gross abnormality. Stomach/Bowel: No bowel obstruction or ileus. Scattered diverticulosis of the distal colon without diverticulitis. No bowel wall thickening or inflammatory change. Vascular/Lymphatic: Aortic atherosclerosis. No enlarged abdominal or pelvic lymph nodes. Reproductive: Coarse uterine calcifications likely represent degenerating fibroids. No adnexal masses. Other: No free fluid or free gas.   No abdominal wall hernia. Musculoskeletal: Unremarkable bilateral hip arthroplasties. No acute or destructive bony lesions. Reconstructed images demonstrate no additional findings. IMPRESSION: 1. Large spiculated left upper lobe suprahilar mass most consistent with primary bronchogenic malignancy. This may be amenable to endobronchial sampling given proximity to the left upper bronchus. 2. Indeterminate 8 mm right upper lobe pulmonary nodule. Metastatic disease cannot be excluded. Attention on follow-up recommended. 3. No acute intra-abdominal or intrapelvic process. No evidence of subdiaphragmatic metastases. 4. Sigmoid diverticulosis without diverticulitis. 5. Aortic Atherosclerosis (ICD10-I70.0). Coronary artery atherosclerosis. Electronically Signed   By: MRanda NgoM.D.   On: 04/25/2021 19:18   EEG adult  Result Date: 04/25/2021 YLora Havens MD     04/25/2021 12:46 PM Patient Name: GLaticha FerrucciMRN: [id number] Attending: PLora HavensReferring Physician/Provider: Dr CIrene PapDate: 04/25/2021 Duration: 22.33 mins Patient history:  75year old female with new onset seizure with metastatic brain lesion.  EEG to evaluate for seizure Level of alertness: Awake AEDs during EEG study: LEV, GBP Technical aspects: This EEG study was done with scalp electrodes positioned according to the 10-20 International system of electrode placement. Electrical activity was acquired at a sampling rate of 500Hz  and reviewed with a high frequency filter of 70Hz  and a low frequency filter of 1Hz . EEG data were recorded continuously and digitally stored. Description: The posterior dominant rhythm consists of 9 Hz activity of moderate voltage (25-35 uV) seen predominantly in posterior head regions, symmetric and reactive to eye opening and eye closing. Hyperventilation and photic stimulation were not performed.   IMPRESSION: This study is within normal limits. No seizures or epileptiform discharges were seen  throughout the recording. Priyanka Barbra Sarks   Overnight EEG with video  Result Date: 04/27/2021 Tara Havens, MD     04/28/2021  6:56 AM Patient Name: Tara Murphy MRN: [id number] Epilepsy Attending: Lora Murphy Referring Physician/Provider: Dr Donnetta Simpers Duration: 04/26/2021 1206 to 04/27/2021 1206  Patient history:  75 year old female with new onset seizure with metastatic brain lesion. EEG to evaluate for seizure  Level of alertness: Awake, asleep  AEDs during EEG study: LEV, GBP  Technical aspects: This EEG study was done with scalp electrodes positioned according to the 10-20 International system of electrode placement. Electrical activity was acquired at a sampling rate of 500Hz  and reviewed with a high frequency filter of 70Hz  and a low frequency filter of 1Hz . EEG data were recorded continuously and digitally stored.  Description: The posterior dominant rhythm consists of 8-9 Hz activity of moderate voltage (25-35 uV) seen predominantly in posterior head regions, symmetric and reactive to eye opening and eye closing. Sleep was characterized by vertex waves, sleep spindles (12 to 14 Hz), maximal frontocentral region.  Hyperventilation and photic stimulation were not performed.    IMPRESSION: This study is within normal limits. No seizures or epileptiform discharges were seen throughout the recording.  Priyanka Barbra Sarks   DG Hip Unilat W or Wo Pelvis 2-3 Views Left  Result Date: 04/17/2021 CLINICAL DATA:  Left leg pain weakness. The patient reports multiple falls since yesterday. Initial encounter. EXAM: DG HIP (WITH OR WITHOUT PELVIS) 2-3V LEFT COMPARISON:  None. FINDINGS: No acute bony or joint abnormality is seen on the right or left. 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 Hardware is intact without complicating feature. Soft tissues are negative. IMPRESSION: No acute abnormality. Status post bilateral total hip replacement. Electronically Signed   By: Inge Rise M.D.   On:  04/17/2021 20:36    04/30/21 -  bilateral parietal craniotomy with resection of brain tumor, microdissection, intraoperative stereotactic navigation for volumetric resection -with neurosurgeon Dr. Annette Stable   Subjective: Pt seen with husband and one daughter at bedside this AM.  Pt is encouraged by the pathology results and hopeful for good news regarding treatment options and prognosis.  Very motivated to begin rehab and start her treatment.  She reports headaches usually at night.  This AM, feels a bit 'swimmy headed' but otherwise feeling well.  No acute complaints.    Discharge Exam: Vitals:   05/03/21 0323 05/03/21 0752  BP: 133/65 136/78  Pulse: (!) 59 74  Resp:  16  Temp: 97.9 F (36.6 C) 97.7 F (36.5 C)  SpO2: 98% 97%   Vitals:   05/02/21 2012 05/02/21 2337 05/03/21 0323 05/03/21 0752  BP: (!) 146/66 135/68 133/65 136/78  Pulse: 77 62 (!) 59 74  Resp:    16  Temp: 97.9 F (36.6 C) 98.2 F (36.8 C) 97.9 F (36.6 C) 97.7 F (36.5 C)  TempSrc: Oral Oral    SpO2: 95% 96% 98% 97%  Weight:      Height:        General: Pt is alert, awake, not in acute distress, in good spirits, obese HEENT: craniotomy incision stable and without signs of infection, healing well Cardiovascular: RRR, no peripheral edema Respiratory: normal respiratory effort, on room air Extremities: no edema, no cyanosis    The results of significant diagnostics from this hospitalization (including imaging, microbiology, ancillary and laboratory) are listed below for reference.     Microbiology: Recent Results (from the past 240 hour(s))  SARS CORONAVIRUS 2 (TAT 6-24 HRS) Nasopharyngeal Nasopharyngeal Swab     Status: None   Collection Time: 04/25/21  3:36 PM   Specimen: Nasopharyngeal Swab  Result Value Ref Range Status   SARS Coronavirus 2 NEGATIVE NEGATIVE Final    Comment: (NOTE) SARS-CoV-2 target nucleic acids are NOT DETECTED.  The SARS-CoV-2 RNA is generally detectable in upper and  lower respiratory specimens during the acute phase of infection. Negative results do not preclude SARS-CoV-2 infection, do not rule out co-infections with other pathogens, and should not be used as the sole basis for treatment or other patient management decisions. Negative results must be combined with clinical observations, patient history, and epidemiological information. The expected result is Negative.  Fact Sheet for Patients: SugarRoll.be  Fact Sheet for Healthcare Providers: https://www.woods-mathews.com/  This test is not yet approved or cleared by the [place] FDA and  has been authorized for detection and/or diagnosis of SARS-CoV-2 by FDA under an Emergency Use Authorization (EUA). This EUA will remain  in effect (meaning this test can be used) for the duration of the COVID-19 declaration under Se ction 564(b)(1) of the Act, 21 U.S.C. section 360bbb-3(b)(1), unless the authorization is terminated or revoked sooner.  Performed at [facility] [facility], [place] [address] [place], [place] [address]   Surgical PCR screen     Status: None   Collection Time: 04/29/21 10:46 PM   Specimen: Nasal Mucosa; Nasal Swab  Result Value Ref Range Status   MRSA, PCR NEGATIVE NEGATIVE Final   Staphylococcus aureus NEGATIVE NEGATIVE Final    Comment: (NOTE) The Xpert SA Assay (FDA approved for NASAL specimens in patients 2 years of age and older), is one component of a comprehensive surveillance program. It is not intended to diagnose infection nor to guide or monitor treatment. Performed at [facility], [place] [address], [place], [place] [address]      Labs: BNP (last 3 results) No results for input(s): BNP in the last 8760 hours. Basic Metabolic Panel: Recent Labs  Lab 04/27/21 0341 04/30/21 0356 04/30/21 0936 05/01/21 0510 05/02/21 0448 05/03/21 0443  NA 138 135 138 136 136 137  K 4.3 5.3* 4.3 4.6 4.2 4.2  CL 107 105  --   108 106 105  CO2 24 19*  --  24 23 25   GLUCOSE 144* 140*  --  118* 126* 118*  BUN 18 20  --  18 17 15   CREATININE 0.64 0.56  --  0.53 0.50 0.57  CALCIUM 9.0 9.1  --  8.4* 8.4* 8.5*  MG  --   --   --  2.0 2.1 2.1  PHOS  --  4.4  --  3.7 2.8 3.3   Liver Function Tests: Recent Labs  Lab 04/30/21 0356 05/01/21 0510 05/02/21 0448  05/03/21 0443  AST  --  42* 23 30  ALT  --  97* 66* 61*  ALKPHOS  --  79 73 74  BILITOT  --  0.4 0.4 0.4  PROT  --  5.5* 5.5* 5.7*  ALBUMIN 3.4* 3.0* 2.9* 2.9*   No results for input(s): LIPASE, AMYLASE in the last 168 hours. No results for input(s): AMMONIA in the last 168 hours. CBC: Recent Labs  Lab 04/27/21 0341 04/29/21 1442 04/30/21 0936 05/01/21 0510 05/02/21 0448 05/03/21 0443  WBC 11.2* 11.1*  --  17.3* 12.6* 10.2  NEUTROABS  --   --   --  14.9* 10.6* 8.0*  HGB 12.6 14.7 13.9 13.1 12.6 12.9  HCT 37.8 43.4 41.0 39.2 37.4 38.3  MCV 91.7 90.0  --  92.0 91.9 92.3  PLT 337 354  --  313 285 285   Cardiac Enzymes: No results for input(s): CKTOTAL, CKMB, CKMBINDEX, TROPONINI in the last 168 hours. BNP: Invalid input(s): POCBNP CBG: Recent Labs  Lab 05/02/21 0736 05/02/21 1225 05/02/21 1619 05/02/21 2219 05/03/21 0750  GLUCAP 109* 99 95 120* 96   D-Dimer No results for input(s): DDIMER in the last 72 hours. Hgb A1c No results for input(s): HGBA1C in the last 72 hours. Lipid Profile No results for input(s): CHOL, HDL, LDLCALC, TRIG, CHOLHDL, LDLDIRECT in the last 72 hours. Thyroid function studies No results for input(s): TSH, T4TOTAL, T3FREE, THYROIDAB in the last 72 hours.  Invalid input(s): FREET3 Anemia work up No results for input(s): VITAMINB12, FOLATE, FERRITIN, TIBC, IRON, RETICCTPCT in the last 72 hours. Urinalysis    Component Value Date/Time   COLORURINE YELLOW 12/07/2015 1135   APPEARANCEUR CLOUDY (A) 12/07/2015 1135   LABSPEC 1.010 12/07/2015 1135   PHURINE 5.5 12/07/2015 1135   GLUCOSEU NEGATIVE 12/07/2015  1135   HGBUR NEGATIVE 12/07/2015 1135   BILIRUBINUR neg 04/16/2021 1504   KETONESUR NEGATIVE 12/07/2015 1135   PROTEINUR Negative 04/16/2021 1504   PROTEINUR NEGATIVE 12/07/2015 1135   UROBILINOGEN 0.2 (A) 04/16/2021 1504   NITRITE neg 04/16/2021 1504   NITRITE NEGATIVE 12/07/2015 1135   LEUKOCYTESUR Negative 04/16/2021 1504   Sepsis Labs Invalid input(s): PROCALCITONIN,  WBC,  LACTICIDVEN Microbiology Recent Results (from the past 240 hour(s))  SARS CORONAVIRUS 2 (TAT 6-24 HRS) Nasopharyngeal Nasopharyngeal Swab     Status: None   Collection Time: 04/25/21  3:36 PM   Specimen: Nasopharyngeal Swab  Result Value Ref Range Status   SARS Coronavirus 2 NEGATIVE NEGATIVE Final    Comment: (NOTE) SARS-CoV-2 target nucleic acids are NOT DETECTED.  The SARS-CoV-2 RNA is generally detectable in upper and lower respiratory specimens during the acute phase of infection. Negative results do not preclude SARS-CoV-2 infection, do not rule out co-infections with other pathogens, and should not be used as the sole basis for treatment or other patient management decisions. Negative results must be combined with clinical observations, patient history, and epidemiological information. The expected result is Negative.  Fact Sheet for Patients: SugarRoll.be  Fact Sheet for Healthcare Providers: https://www.woods-mathews.com/  This test is not yet approved or cleared by the [place] FDA and  has been authorized for detection and/or diagnosis of SARS-CoV-2 by FDA under an Emergency Use Authorization (EUA). This EUA will remain  in effect (meaning this test can be used) for the duration of the COVID-19 declaration under Se ction 564(b)(1) of the Act, 21 U.S.C. section 360bbb-3(b)(1), unless the authorization is terminated or revoked sooner.  Performed at [facility], [address] [place], [place] [address]   Surgical PCR screen      Status: None   Collection Time: 04/29/21 10:46 PM   Specimen: Nasal Mucosa; Nasal Swab  Result Value Ref Range Status   MRSA, PCR NEGATIVE NEGATIVE Final   Staphylococcus aureus NEGATIVE NEGATIVE Final    Comment: (NOTE) The Xpert SA Assay (FDA approved for NASAL specimens in patients 31 years of age and older), is one component of a comprehensive surveillance program. It is not intended to diagnose infection nor to guide or monitor treatment. Performed at [facility], [place] [address], [place], [place] [address]      Time coordinating discharge: Over 30 minutes  SIGNED:   Ezekiel Slocumb, DO [facility] 05/03/2021, 10:24 AM   If 7PM-7AM, please contact night-coverage [email or web address]

## 2021-05-03 NOTE — H&P (Signed)
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  Per chart review lives with spouse.  1 level home one-step to entry.  Ambulates household distances with the use of a Rollator and has had a history of falls that began in February but was independent prior.  Presented 04/30/2021 after seizure-like activity left side jerking movements.  CT/MRI of the head showed a hemorrhagic lesion of the paramedian superior right frontal lobe measuring 2.6 x 1.6 cm.  There is moderate surrounding vasogenic edema.  The midline structures were normal.  Findings most consistent with solitary metastasis.  CT of the chest showed a large spiculated left upper lobe suprahilar mass consistent with primary bronchogenic malignancy.  EEG was negative for seizure.  She had been loaded with Keppra and remains on 1000 mg twice daily.  Patient underwent bilateral parietal craniotomy with resection of brain tumor microdissection intraoperative stereotactic navigation for volumetric resection 04/30/2021 per Dr. Annette Stable.  Decadron protocol as indicated.  Pathology report remains pending and neurosurgery plans to discuss with radiation oncology in regards to postoperative SRS treatment.  Tolerating a regular consistency diet.  Therapy evaluations completed due to patient decreased functional mobility was admitted for a comprehensive rehab program. She is very motivated for CIR!  Review of Systems  Constitutional:  Negative for chills and fever.  HENT:  Negative for hearing  loss.   Eyes:  Negative for blurred vision and double vision.  Respiratory:  Negative for cough and shortness of breath.   Cardiovascular:  Negative for chest pain, palpitations and leg swelling.  Gastrointestinal:  Positive for diarrhea. Negative for heartburn, nausea and vomiting.       GERD  Genitourinary:  Negative for dysuria, flank pain and hematuria.  Musculoskeletal:  Positive for back pain, falls, joint pain and myalgias.  Skin:  Negative for rash.  Neurological:  Positive for sensory change.       Restless legs/idiopathic neuropathy with left foot drop  Psychiatric/Behavioral:  Positive for depression. The patient has insomnia.   All other systems reviewed and are negative. 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[place])    S/p total hip replacement left   Breast cancer ([place]) 2015   left    Cataract    Cholecystolithiasis    Chronic allergic rhinitis    Depression    Former smoker, stopped smoking in distant past 10/05/2019   Quit 2000; 61 y smoking history   GERD (gastroesophageal reflux disease)    Hepatic steatosis 06/08/2020   Intermittent elevated LFTs and ultrasound 2018   Hyperlipemia    IBS (irritable bowel syndrome) 10/08/2018   Nl colonoscopy and EGD 09/2018   IBS (irritable bowel syndrome)    Lichen plano-pilaris    Major depression, 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 Repeat 2020   Urinary tract infection    hx of frequent    Past Surgical History:  Procedure Laterality Date   APPENDECTOMY     APPLICATION OF CRANIAL NAVIGATION Right 04/30/2021   Procedure: APPLICATION OF CRANIAL NAVIGATION;  Surgeon: Earnie Larsson, MD;   Location: [facility];  Service: Neurosurgery;  Laterality: Right;   BREAST LUMPECTOMY Left 2015   cataracts     CHOLECYSTECTOMY N/A 10/29/2016   Procedure: LAPAROSCOPIC CHOLECYSTECTOMY;  Surgeon: Erroll Luna, MD;  Location: [facility];  Service: General;  Laterality: N/A;   COLONOSCOPY     CRANIOTOMY Right 04/30/2021   Procedure: Right Parietal Craniotomy for tumor with Brain Lab;  Surgeon: Earnie Larsson, MD;  Location: [facility];  Service: Neurosurgery;  Laterality: Right;   LIPOSUCTION     TOTAL HIP ARTHROPLASTY     bilateral hip arthoplasty   TOTAL HIP ARTHROPLASTY Right 12/18/2015   Procedure: RIGHT TOTAL HIP ARTHROPLASTY ANTERIOR APPROACH;  Surgeon: Gaynelle Arabian, MD;  Location: [facility];  Service: Orthopedics;  Laterality: Right;   TOTAL KNEE ARTHROPLASTY     TOTAL KNEE ARTHROPLASTY Left 11/25/2016   Procedure: LEFT TOTAL KNEE ARTHROPLASTY;  Surgeon: Gaynelle Arabian, MD;  Location: [facility];  Service: Orthopedics;  Laterality: Left;  with abductor block   TUBAL LIGATION     UPPER GASTROINTESTINAL ENDOSCOPY     Family History  Problem Relation Age of Onset   Arthritis Mother    Diabetes Mother    Crohn's disease Mother    Arthritis Father    Lung cancer Father    Breast cancer Sister 21   Hyperlipidemia Maternal Aunt    Stomach cancer Maternal Grandmother    Colitis Neg Hx    Rectal cancer Neg Hx    Esophageal cancer Neg Hx    Social History:  reports that she quit smoking about 29 years ago. Her smoking use included cigarettes. She smoked an average of 1 pack per day. She has never used smokeless tobacco. She reports current alcohol use of about 14.0 standard drinks per week. She reports that she does not use drugs. Allergies:  Allergies  Allergen Reactions   Fluorescein Itching and Other (See Comments)   Contrast Media [Iodinated Diagnostic Agents] Hives   Hydromorphone Hcl Nausea Only   Other Rash    Certain Band-Aids don't agree with the patient's skin; hospital ID wristband causes rashes    Sulfamethoxazole-Trimethoprim Hives   Fentanyl Other (See Comments)    Redness and flushing over body    Tape Rash    Certain Band-Aids don't agree with the patient's skin; hospital ID wristband causes rashes   Terbinafine And Related Hives and Rash   Medications Prior to Admission  Medication Sig Dispense Refill   clobetasol (TEMOVATE) 0.05 % external solution APPLY TO AFFECTED AREA(S) TOPICALLY TWO TIMES A DAY AS NEEDED FOR SCALP INFLAMMATION 50 mL 0   fluticasone (FLONASE) 50 MCG/ACT nasal spray Place 2 sprays into both nostrils daily. 16 g 6   gabapentin (NEURONTIN) 300 MG capsule TAKE TWO CAPSULES BY MOUTH TWICE A DAY 360 capsule 2   rOPINIRole (REQUIP) 4 MG tablet Take 1 tablet (4 mg total) by mouth at bedtime. 90 tablet 3   traMADol (ULTRAM) 50 MG tablet Take 1 tablet (50 mg total) by mouth every 6 (six) hours as needed. (Patient taking differently: Take 50 mg by mouth every 6 (six) hours as needed for moderate pain or  severe pain.) 60 tablet 0   busPIRone (BUSPAR) 7.5 MG tablet TAKE ONE TABLET BY MOUTH TWICE A DAY AS NEEDED (Patient taking differently: Take 7.5 mg by mouth 2 (two) times daily as needed (anxiety).) 180 tablet 3    Drug Regimen Review Drug regimen was reviewed and remains appropriate with no significant issues identified  Home: Home Living Family/patient expects to be discharged to:: Private residence Living Arrangements: Spouse/significant other Available Help at Discharge: Family, Available 24 hours/day Type of Home: House Home Access: Stairs to enter [employer] of Steps: 1 Entrance Stairs-Rails: None Home Layout: One level Bathroom Shower/Tub: Multimedia programmer: Standard Bathroom Accessibility: Yes Home Equipment: Environmental consultant - 4 wheels, Grab bars - toilet, Shower seat - built in, Financial controller: Reacher  Lives With: Spouse   Functional History: Prior Function Level of Independence: Needs assistance Gait /  Transfers Assistance Needed: Per Chart: pt ambulates household distances with use of rollator. Pt requires PRN assist for bed mobility and transfers. Recent history of multiple falls. Symptoms began in February, prior to pt was independent ADL's / Homemaking Assistance Needed: Per Chart: spouse assists with bathing, LB dressing, IADLs  Functional Status:  Mobility: Bed Mobility Overal bed mobility: Needs Assistance Bed Mobility: Supine to Sit Supine to sit: HOB elevated, Min assist Sit to supine: Mod assist General bed mobility comments: Performed supine to sit with A from daughter. Initiated well and scooted hips EOB with supervision. Transfers Overall transfer level: Needs assistance Equipment used: Rolling walker (2 wheeled) Transfers: Sit to/from Stand Sit to Stand: Mod assist, +2 physical assistance Stand pivot transfers: Mod assist, +2 safety/equipment General transfer comment: Mod A +2 for sit to stand to power up to standing with verbal cueing provided for hand placement on bed and RW. Ambulation/Gait Ambulation/Gait assistance: Mod assist, +2 safety/equipment Gait Distance (Feet): 25 Feet Assistive device: Rolling walker (2 wheeled) Gait Pattern/deviations: Step-to pattern, Decreased dorsiflexion - left, Step-through pattern, Decreased stance time - left, Trendelenburg, Narrow base of support General Gait Details: Pt ambulated 61ft with Mod A +2 for safety and chair follow from daughter. Verbal cues provided for sequencing, postural adjustments, and use of RW. Gait velocity: Decreased Gait velocity interpretation: <1.8 ft/sec, indicate of risk for recurrent falls    ADL: ADL Overall ADL's : Needs assistance/impaired Eating/Feeding: Set up, Sitting Grooming: Wash/dry hands, Wash/dry face, Set up, Bed level Upper Body Dressing : Minimal assistance, Bed level Lower Body Dressing: Moderate assistance, Sit to/from stand Lower Body Dressing Details (indicate cue type and  reason): able to figure 4 both sides but needs (A) with L LE. pt able to don doff socks bil LE. pt requires (A) to don L shoe with max (A). Pt uses shoe horn at baseline. p Toilet Transfer: +2 for physical assistance, Ambulation, Minimal assistance, RW Toilet Transfer Details (indicate cue type and reason): simulated with chair and eob Functional mobility during ADLs: +2 for physical assistance, Moderate assistance  Cognition: Cognition Overall Cognitive Status: Within Functional Limits for tasks assessed Orientation Level: Oriented X4 Cognition Arousal/Alertness: Awake/alert Behavior During Therapy: WFL for tasks assessed/performed Overall Cognitive Status: Within Functional Limits for tasks assessed Area of Impairment: Attention, Following commands, Safety/judgement, Problem solving Current Attention Level: Sustained Following Commands: Follows one step commands with increased time Safety/Judgement: Decreased awareness of deficits Problem Solving: Requires verbal cues, Requires tactile cues General Comments: Followed one step commands accurately  Physical Exam: Blood pressure 133/65, pulse (!) 59, temperature 97.9 F (36.6 C), resp. rate 18,  height 5\' 6"  (1.676 m), weight 94.3 kg, SpO2 98 %. Gen: no distress, normal appearing HEENT: oral mucosa pink and moist, NCAT Cardio: Bradycardia Chest: normal effort, normal rate of breathing Abd: soft, non-distended Ext: no edema Psych: pleasant, normal affect Skin: Craniotomy site clean and dry Neurological:     Comments: Patient a bit lethargic but arousable.  Makes eye contact with examiner.  Follows commands.  Provides name and age. Left sided foot drop 0/5 DF  Results for orders placed or performed during the hospital encounter of 04/24/21 (from the past 48 hour(s))  Glucose, capillary     Status: Abnormal   Collection Time: 05/01/21  7:25 AM  Result Value Ref Range   Glucose-Capillary 123 (H) 70 - 99 mg/dL    Comment: Glucose  reference range applies only to samples taken after fasting for at least 8 hours.  Glucose, capillary     Status: Abnormal   Collection Time: 05/01/21 11:34 AM  Result Value Ref Range   Glucose-Capillary 116 (H) 70 - 99 mg/dL    Comment: Glucose reference range applies only to samples taken after fasting for at least 8 hours.  Glucose, capillary     Status: Abnormal   Collection Time: 05/01/21  3:43 PM  Result Value Ref Range   Glucose-Capillary 179 (H) 70 - 99 mg/dL    Comment: Glucose reference range applies only to samples taken after fasting for at least 8 hours.  Comprehensive metabolic panel     Status: Abnormal   Collection Time: 05/02/21  4:48 AM  Result Value Ref Range   Sodium 136 135 - 145 mmol/L   Potassium 4.2 3.5 - 5.1 mmol/L   Chloride 106 98 - 111 mmol/L   CO2 23 22 - 32 mmol/L   Glucose, Bld 126 (H) 70 - 99 mg/dL    Comment: Glucose reference range applies only to samples taken after fasting for at least 8 hours.   BUN 17 8 - 23 mg/dL   Creatinine, Ser 0.50 0.44 - 1.00 mg/dL   Calcium 8.4 (L) 8.9 - 10.3 mg/dL   Total Protein 5.5 (L) 6.5 - 8.1 g/dL   Albumin 2.9 (L) 3.5 - 5.0 g/dL   AST 23 15 - 41 U/L   ALT 66 (H) 0 - 44 U/L   Alkaline Phosphatase 73 38 - 126 U/L   Total Bilirubin 0.4 0.3 - 1.2 mg/dL   GFR, Estimated >60 >60 mL/min    Comment: (NOTE) Calculated using the CKD-EPI Creatinine Equation (2021)    Anion gap 7 5 - 15    Comment: Performed at [facility], [place] [address], [place], [place] [address]  CBC with Differential/Platelet     Status: Abnormal   Collection Time: 05/02/21  4:48 AM  Result Value Ref Range   WBC 12.6 (H) 4.0 - 10.5 K/uL   RBC 4.07 3.87 - 5.11 MIL/uL   Hemoglobin 12.6 12.0 - 15.0 g/dL   HCT 37.4 36.0 - 46.0 %   MCV 91.9 80.0 - 100.0 fL   MCH 31.0 26.0 - 34.0 pg   MCHC 33.7 30.0 - 36.0 g/dL   RDW 12.5 11.5 - 15.5 %   Platelets 285 150 - 400 K/uL   nRBC 0.0 0.0 - 0.2 %   Neutrophils Relative % 84 %   Neutro Abs 10.6 (H)  1.7 - 7.7 K/uL   Lymphocytes Relative 8 %   Lymphs Abs 1.0 0.7 - 4.0 K/uL   Monocytes Relative 7 %  Monocytes Absolute 0.9 0.1 - 1.0 K/uL   Eosinophils Relative 0 %   Eosinophils Absolute 0.0 0.0 - 0.5 K/uL   Basophils Relative 0 %   Basophils Absolute 0.0 0.0 - 0.1 K/uL   Immature Granulocytes 1 %   Abs Immature Granulocytes 0.11 (H) 0.00 - 0.07 K/uL    Comment: Performed at [facility] [address] [place], [place] [address]  Magnesium     Status: None   Collection Time: 05/02/21  4:48 AM  Result Value Ref Range   Magnesium 2.1 1.7 - 2.4 mg/dL    Comment: Performed at [facility] [address] [place], [place] [address]  Phosphorus     Status: None   Collection Time: 05/02/21  4:48 AM  Result Value Ref Range   Phosphorus 2.8 2.5 - 4.6 mg/dL    Comment: Performed at [facility] [address], [place], [place] [address]  Glucose, capillary     Status: Abnormal   Collection Time: 05/02/21  7:36 AM  Result Value Ref Range   Glucose-Capillary 109 (H) 70 - 99 mg/dL    Comment: Glucose reference range applies only to samples taken after fasting for at least 8 hours.  Glucose, capillary     Status: None   Collection Time: 05/02/21 12:25 PM  Result Value Ref Range   Glucose-Capillary 99 70 - 99 mg/dL    Comment: Glucose reference range applies only to samples taken after fasting for at least 8 hours.  Glucose, capillary     Status: None   Collection Time: 05/02/21  4:19 PM  Result Value Ref Range   Glucose-Capillary 95 70 - 99 mg/dL    Comment: Glucose reference range applies only to samples taken after fasting for at least 8 hours.  Glucose, capillary     Status: Abnormal   Collection Time: 05/02/21 10:19 PM  Result Value Ref Range   Glucose-Capillary 120 (H) 70 - 99 mg/dL    Comment: Glucose reference range applies only to samples taken after fasting for at least 8 hours.  Comprehensive metabolic panel     Status: Abnormal   Collection Time:  05/03/21  4:43 AM  Result Value Ref Range   Sodium 137 135 - 145 mmol/L   Potassium 4.2 3.5 - 5.1 mmol/L   Chloride 105 98 - 111 mmol/L   CO2 25 22 - 32 mmol/L   Glucose, Bld 118 (H) 70 - 99 mg/dL    Comment: Glucose reference range applies only to samples taken after fasting for at least 8 hours.   BUN 15 8 - 23 mg/dL   Creatinine, Ser 0.57 0.44 - 1.00 mg/dL   Calcium 8.5 (L) 8.9 - 10.3 mg/dL   Total Protein 5.7 (L) 6.5 - 8.1 g/dL   Albumin 2.9 (L) 3.5 - 5.0 g/dL   AST 30 15 - 41 U/L   ALT 61 (H) 0 - 44 U/L   Alkaline Phosphatase 74 38 - 126 U/L   Total Bilirubin 0.4 0.3 - 1.2 mg/dL   GFR, Estimated >60 >60 mL/min    Comment: (NOTE) Calculated using the CKD-EPI Creatinine Equation (2021)    Anion gap 7 5 - 15    Comment: Performed at [facility], [place] [address], [place], [place] [address]  CBC with Differential/Platelet     Status: Abnormal   Collection Time: 05/03/21  4:43 AM  Result Value Ref Range   WBC 10.2 4.0 - 10.5 K/uL  RBC 4.15 3.87 - 5.11 MIL/uL   Hemoglobin 12.9 12.0 - 15.0 g/dL   HCT 38.3 36.0 - 46.0 %   MCV 92.3 80.0 - 100.0 fL   MCH 31.1 26.0 - 34.0 pg   MCHC 33.7 30.0 - 36.0 g/dL   RDW 12.5 11.5 - 15.5 %   Platelets 285 150 - 400 K/uL   nRBC 0.0 0.0 - 0.2 %   Neutrophils Relative % 79 %   Neutro Abs 8.0 (H) 1.7 - 7.7 K/uL   Lymphocytes Relative 12 %   Lymphs Abs 1.3 0.7 - 4.0 K/uL   Monocytes Relative 8 %   Monocytes Absolute 0.8 0.1 - 1.0 K/uL   Eosinophils Relative 0 %   Eosinophils Absolute 0.0 0.0 - 0.5 K/uL   Basophils Relative 0 %   Basophils Absolute 0.0 0.0 - 0.1 K/uL   Immature Granulocytes 1 %   Abs Immature Granulocytes 0.10 (H) 0.00 - 0.07 K/uL    Comment: Performed at [facility] [address], [place], [place] [address]  Magnesium     Status: None   Collection Time: 05/03/21  4:43 AM  Result Value Ref Range   Magnesium 2.1 1.7 - 2.4 mg/dL    Comment: Performed at [facility] [address], [place],  [place] [address]  Phosphorus     Status: None   Collection Time: 05/03/21  4:43 AM  Result Value Ref Range   Phosphorus 3.3 2.5 - 4.6 mg/dL    Comment: Performed at [facility] [address] [place], [place] [address]   No results found.     Medical Problem List and Plan: 1.  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  Pathology reports pending  -patient may shower but incisions must be covered.   -ELOS/Goals: 10-14 days modI  -Admit to CIR 2.  Antithrombotics: -DVT/anticoagulation: SCDs Mechanical: Sequential compression devices, below knee Bilateral lower extremities  -antiplatelet therapy: N/A 3. Postoperative pain: Continue Hydrocodone as needed 4. Anxiety: Conntinue BuSpar 7.5 mg twice daily as needed ,Ativan 1 mg every 6 hours as needed anxiety  -antipsychotic agents: N/A 5. Neuropsych: This patient is capable of making decisions on her own behalf. 6. Skin/Wound Care: Routine skin checks 7. Fluids/Electrolytes/Nutrition: Routine in and outs with follow-up chemistries 8.  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  Continue Neurontin 600 mg twice daily. Continue use of AFO.  9.  Seizure prophylaxis.  EEG negative.  Keppra 1000 mg twice daily 10.  Restless leg syndrome.  Continue Requip 4 mg nightly 11.  GERD.  Protonix 12.  History of left breast cancer 2015.  Follow-up outpatient 13.  Hepatic Steatosis intermittently elevated since 2018.  Follow-up chemistries 14.  Steroid-induced hyperglycemia.  Hemoglobin A1c 5.5.  SSI  I have personally performed a face to face diagnostic evaluation, including, but not limited to relevant history and physical exam findings, of this patient and developed relevant assessment and plan.  Additionally, I have reviewed and  concur with the physician assistant's documentation above.  Leeroy Cha, MD  Lavon Paganini [place], PA-C 05/03/2021

## 2021-05-03 NOTE — H&P (Addendum)
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  Per chart review lives with spouse.  1 level home one-step to entry.  Ambulates household distances with the use of a Rollator and has had a history of falls that began in February but was independent prior.  Presented 04/24/2021 after seizure-like activity left side jerking movements.  CT/MRI of the head showed a hemorrhagic lesion of the paramedian superior right frontal lobe measuring 2.6 x 1.6 cm.  There is moderate surrounding vasogenic edema.  The midline structures were normal.  Findings most consistent with solitary metastasis.  CT of the chest showed a large spiculated left upper lobe suprahilar mass consistent with primary bronchogenic malignancy.  EEG was negative for seizure.  She had been loaded with Keppra and remains on 1000 mg twice daily.  Patient underwent bilateral parietal craniotomy with resection of brain tumor microdissection intraoperative stereotactic navigation for volumetric resection 04/30/2021 per Dr. Annette Stable.  Decadron protocol as indicated.  Pathology report remains pending and neurosurgery plans to discuss with radiation oncology in regards to postoperative SRS treatment.  Tolerating a regular consistency diet.  Therapy evaluations completed due to patient decreased functional mobility was admitted for a comprehensive rehab program. She is very motivated for CIR!  Review of Systems  Constitutional:  Negative for chills and fever.  HENT:  Negative for hearing loss.   Eyes:  Negative for blurred vision and double vision.   Respiratory:  Negative for cough and shortness of breath.   Cardiovascular:  Negative for chest pain, palpitations and leg swelling.  Gastrointestinal:  Positive for diarrhea. Negative for heartburn, nausea and vomiting.       GERD  Genitourinary:  Negative for dysuria, flank pain and hematuria.  Musculoskeletal:  Positive for back pain, falls, joint pain and myalgias.  Skin:  Negative for rash.  Neurological:  Positive for sensory change.       Restless legs/idiopathic neuropathy with left foot drop  Psychiatric/Behavioral:  Positive for depression. The patient has insomnia.   All other systems reviewed and are negative. 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[facility])    S/p total hip replacement left   Breast cancer ([facility]) 2015   left    Cataract    Cholecystolithiasis    Chronic allergic rhinitis    Depression    Former smoker, stopped smoking in distant past 10/05/2019   Quit 2000; 54 y smoking history   GERD (gastroesophageal reflux disease)    Hepatic steatosis 06/08/2020   Intermittent elevated LFTs and ultrasound 2018   Hyperlipemia    IBS (irritable bowel syndrome) 10/08/2018   Nl colonoscopy and EGD 09/2018   IBS (irritable bowel syndrome)    Lichen plano-pilaris    Major depression, 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 Repeat  2020   Urinary tract infection    hx of frequent    Past Surgical History:  Procedure Laterality Date   APPENDECTOMY     APPLICATION OF CRANIAL NAVIGATION Right 04/30/2021   Procedure: APPLICATION OF CRANIAL NAVIGATION;  Surgeon: Earnie Larsson, MD;  Location: [facility];  Service: Neurosurgery;  Laterality: Right;    BREAST LUMPECTOMY Left 2015   cataracts     CHOLECYSTECTOMY N/A 10/29/2016   Procedure: LAPAROSCOPIC CHOLECYSTECTOMY;  Surgeon: Erroll Luna, MD;  Location: [facility];  Service: General;  Laterality: N/A;   COLONOSCOPY     CRANIOTOMY Right 04/30/2021   Procedure: Right Parietal Craniotomy for tumor with Brain Lab;  Surgeon: Earnie Larsson, MD;  Location: [facility];  Service: Neurosurgery;  Laterality: Right;   LIPOSUCTION     TOTAL HIP ARTHROPLASTY     bilateral hip arthoplasty   TOTAL HIP ARTHROPLASTY Right 12/18/2015   Procedure: RIGHT TOTAL HIP ARTHROPLASTY ANTERIOR APPROACH;  Surgeon: Gaynelle Arabian, MD;  Location: [facility];  Service: Orthopedics;  Laterality: Right;   TOTAL KNEE ARTHROPLASTY     TOTAL KNEE ARTHROPLASTY Left 11/25/2016   Procedure: LEFT TOTAL KNEE ARTHROPLASTY;  Surgeon: Gaynelle Arabian, MD;  Location: [facility];  Service: Orthopedics;  Laterality: Left;  with abductor block   TUBAL LIGATION     UPPER GASTROINTESTINAL ENDOSCOPY     Family History  Problem Relation Age of Onset   Arthritis Mother    Diabetes Mother    Crohn's disease Mother    Arthritis Father    Lung cancer Father    Breast cancer Sister 64   Hyperlipidemia Maternal Aunt    Stomach cancer Maternal Grandmother    Colitis Neg Hx    Rectal cancer Neg Hx    Esophageal cancer Neg Hx    Social History:  reports that she quit smoking about 29 years ago. Her smoking use included cigarettes. She smoked an average of 1 pack per day. She has never used smokeless tobacco. She reports current alcohol use of about 14.0 standard drinks per week. She reports that she does not use drugs. Allergies:  Allergies  Allergen Reactions   Fluorescein Itching and Other (See Comments)   Contrast Media [Iodinated Diagnostic Agents] Hives   Hydromorphone Hcl Nausea Only   Other Rash    Certain Band-Aids don't agree with the patient's skin; hospital ID wristband causes rashes   Sulfamethoxazole-Trimethoprim Hives   Fentanyl Other (See  Comments)    Redness and flushing over body    Tape Rash    Certain Band-Aids don't agree with the patient's skin; hospital ID wristband causes rashes   Terbinafine And Related Hives and Rash   Medications Prior to Admission  Medication Sig Dispense Refill   busPIRone (BUSPAR) 7.5 MG tablet Take 1 tablet (7.5 mg total) by mouth 2 (two) times daily as needed (anxiety).     dexamethasone (DECADRON) 2 MG tablet Take 1 tablet (2 mg total) by mouth every 12 (twelve) hours.     diphenhydrAMINE (BENADRYL) 50 MG capsule Take 1 capsule (50 mg total) by mouth once for 1 dose. 30 capsule 0   fluticasone (FLONASE) 50 MCG/ACT nasal spray Place 2 sprays into both nostrils daily. 16 g 6   gabapentin (NEURONTIN) 300 MG capsule Take 2 capsules (600 mg total) by mouth 2 (two) times daily.     HYDROcodone-acetaminophen (NORCO/VICODIN) 5-325 MG tablet Take 1 tablet by mouth every 4 (four) hours as needed for moderate pain. 30 tablet 0  insulin aspart (NOVOLOG) 100 UNIT/ML injection Inject 0-9 Units into the skin 3 (three) times daily with meals. 10 mL 11   insulin aspart (NOVOLOG) 100 UNIT/ML injection Inject 0-5 Units into the skin at bedtime. 10 mL 11   levETIRAcetam (KEPPRA) 1000 MG tablet Take 1 tablet (1,000 mg total) by mouth 2 (two) times daily.     LORazepam (ATIVAN) 1 MG tablet Take 1 tablet (1 mg total) by mouth every 6 (six) hours as needed for anxiety, seizure or sleep. 30 tablet 0   ondansetron (ZOFRAN) 4 MG tablet Take 1 tablet (4 mg total) by mouth every 4 (four) hours as needed for nausea or vomiting. 20 tablet 0   [START ON 05/04/2021] pantoprazole (PROTONIX) 40 MG tablet Take 1 tablet (40 mg total) by mouth daily.     polyethylene glycol (MIRALAX / GLYCOLAX) 17 g packet Take 17 g by mouth daily as needed for mild constipation. 14 each 0   promethazine (PHENERGAN) 12.5 MG tablet Take 1-2 tablets (12.5-25 mg total) by mouth every 4 (four) hours as needed for refractory nausea / vomiting. 30 tablet 0    rOPINIRole (REQUIP) 4 MG tablet Take 1 tablet (4 mg total) by mouth at bedtime.      Drug Regimen Review Drug regimen was reviewed and remains appropriate with no significant issues identified   Home: Home Living Family/patient expects to be discharged to:: Private residence Living Arrangements: Spouse/significant other Available Help at Discharge: Family, Available 24 hours/day Type of Home: House Home Access: Stairs to enter [employer] of Steps: 1 Entrance Stairs-Rails: None Home Layout: One level Bathroom Shower/Tub: Multimedia programmer: Standard Bathroom Accessibility: Yes Home Equipment: Environmental consultant - 4 wheels, Grab bars - toilet, Shower seat - built in, Financial controller: Reacher  Lives With: Spouse   Functional History: Prior Function Level of Independence: Needs assistance Gait / Transfers Assistance Needed: Per Chart: pt ambulates household distances with use of rollator. Pt requires PRN assist for bed mobility and transfers. Recent history of multiple falls. Symptoms began in February, prior to pt was independent ADL's / Homemaking Assistance Needed: Per Chart: spouse assists with bathing, LB dressing, IADLs   Functional Status:  Mobility: Bed Mobility Overal bed mobility: Needs Assistance Bed Mobility: Supine to Sit Supine to sit: HOB elevated, Min assist Sit to supine: Mod assist General bed mobility comments: Performed supine to sit with A from daughter. Initiated well and scooted hips EOB with supervision. Transfers Overall transfer level: Needs assistance Equipment used: Rolling walker (2 wheeled) Transfers: Sit to/from Stand Sit to Stand: Mod assist, +2 physical assistance Stand pivot transfers: Mod assist, +2 safety/equipment General transfer comment: Mod A +2 for sit to stand to power up to standing with verbal cueing provided for hand placement on bed and RW. Ambulation/Gait Ambulation/Gait assistance: Mod  assist, +2 safety/equipment Gait Distance (Feet): 25 Feet Assistive device: Rolling walker (2 wheeled) Gait Pattern/deviations: Step-to pattern, Decreased dorsiflexion - left, Step-through pattern, Decreased stance time - left, Trendelenburg, Narrow base of support General Gait Details: Pt ambulated 47ft with Mod A +2 for safety and chair follow from daughter. Verbal cues provided for sequencing, postural adjustments, and use of RW. Gait velocity: Decreased Gait velocity interpretation: <1.8 ft/sec, indicate of risk for recurrent falls   ADL: ADL Overall ADL's : Needs assistance/impaired Eating/Feeding: Set up, Sitting Grooming: Wash/dry hands, Wash/dry face, Set up, Bed level Upper Body Dressing : Minimal assistance, Bed level Lower Body Dressing: Moderate assistance, Sit to/from stand Lower  Body Dressing Details (indicate cue type and reason): able to figure 4 both sides but needs (A) with L LE. pt able to don doff socks bil LE. pt requires (A) to don L shoe with max (A). Pt uses shoe horn at baseline. p Toilet Transfer: +2 for physical assistance, Ambulation, Minimal assistance, RW Toilet Transfer Details (indicate cue type and reason): simulated with chair and eob Functional mobility during ADLs: +2 for physical assistance, Moderate assistance   Cognition: Cognition Overall Cognitive Status: Within Functional Limits for tasks assessed Orientation Level: Oriented X4 Cognition Arousal/Alertness: Awake/alert Behavior During Therapy: WFL for tasks assessed/performed Overall Cognitive Status: Within Functional Limits for tasks assessed Area of Impairment: Attention, Following commands, Safety/judgement, Problem solving Current Attention Level: Sustained Following Commands: Follows one step commands with increased time Safety/Judgement: Decreased awareness of deficits Problem Solving: Requires verbal cues, Requires tactile cues General Comments: Followed one step commands  accurately  Physical Exam: Gen: no distress, normal appearing HEENT: oral mucosa pink and moist, NCAT Cardio: Bradycardia Chest: normal effort, normal rate of breathing Abd: soft, non-distended Ext: no edema Psych: pleasant, normal affect Skin: Craniotomy site clean and dry Neurological:     Comments: Patient a bit lethargic but arousable.  Makes eye contact with examiner.  Follows commands.  Provides name and age. Left sided foot drop 0/5 DF  Results for orders placed or performed during the hospital encounter of 04/24/21 (from the past 48 hour(s))  Comprehensive metabolic panel     Status: Abnormal   Collection Time: 05/02/21  4:48 AM  Result Value Ref Range   Sodium 136 135 - 145 mmol/L   Potassium 4.2 3.5 - 5.1 mmol/L   Chloride 106 98 - 111 mmol/L   CO2 23 22 - 32 mmol/L   Glucose, Bld 126 (H) 70 - 99 mg/dL    Comment: Glucose reference range applies only to samples taken after fasting for at least 8 hours.   BUN 17 8 - 23 mg/dL   Creatinine, Ser 0.50 0.44 - 1.00 mg/dL   Calcium 8.4 (L) 8.9 - 10.3 mg/dL   Total Protein 5.5 (L) 6.5 - 8.1 g/dL   Albumin 2.9 (L) 3.5 - 5.0 g/dL   AST 23 15 - 41 U/L   ALT 66 (H) 0 - 44 U/L   Alkaline Phosphatase 73 38 - 126 U/L   Total Bilirubin 0.4 0.3 - 1.2 mg/dL   GFR, Estimated >60 >60 mL/min    Comment: (NOTE) Calculated using the CKD-EPI Creatinine Equation (2021)    Anion gap 7 5 - 15    Comment: Performed at [facility], [place] [address] [place], [place] [address]  CBC with Differential/Platelet     Status: Abnormal   Collection Time: 05/02/21  4:48 AM  Result Value Ref Range   WBC 12.6 (H) 4.0 - 10.5 K/uL   RBC 4.07 3.87 - 5.11 MIL/uL   Hemoglobin 12.6 12.0 - 15.0 g/dL   HCT 37.4 36.0 - 46.0 %   MCV 91.9 80.0 - 100.0 fL   MCH 31.0 26.0 - 34.0 pg   MCHC 33.7 30.0 - 36.0 g/dL   RDW 12.5 11.5 - 15.5 %   Platelets 285 150 - 400 K/uL   nRBC 0.0 0.0 - 0.2 %   Neutrophils Relative % 84 %   Neutro Abs 10.6 (H) 1.7 - 7.7  K/uL   Lymphocytes Relative 8 %   Lymphs Abs 1.0 0.7 - 4.0 K/uL   Monocytes Relative 7 %  Monocytes Absolute 0.9 0.1 - 1.0 K/uL   Eosinophils Relative 0 %   Eosinophils Absolute 0.0 0.0 - 0.5 K/uL   Basophils Relative 0 %   Basophils Absolute 0.0 0.0 - 0.1 K/uL   Immature Granulocytes 1 %   Abs Immature Granulocytes 0.11 (H) 0.00 - 0.07 K/uL    Comment: Performed at [facility] [address], [place], [place] [address]  Magnesium     Status: None   Collection Time: 05/02/21  4:48 AM  Result Value Ref Range   Magnesium 2.1 1.7 - 2.4 mg/dL    Comment: Performed at [facility] [address], [place], [place] [address]  Phosphorus     Status: None   Collection Time: 05/02/21  4:48 AM  Result Value Ref Range   Phosphorus 2.8 2.5 - 4.6 mg/dL    Comment: Performed at [facility] [address], [place], [place] [address]  Glucose, capillary     Status: Abnormal   Collection Time: 05/02/21  7:36 AM  Result Value Ref Range   Glucose-Capillary 109 (H) 70 - 99 mg/dL    Comment: Glucose reference range applies only to samples taken after fasting for at least 8 hours.  Glucose, capillary     Status: None   Collection Time: 05/02/21 12:25 PM  Result Value Ref Range   Glucose-Capillary 99 70 - 99 mg/dL    Comment: Glucose reference range applies only to samples taken after fasting for at least 8 hours.  Glucose, capillary     Status: None   Collection Time: 05/02/21  4:19 PM  Result Value Ref Range   Glucose-Capillary 95 70 - 99 mg/dL    Comment: Glucose reference range applies only to samples taken after fasting for at least 8 hours.  Glucose, capillary     Status: Abnormal   Collection Time: 05/02/21 10:19 PM  Result Value Ref Range   Glucose-Capillary 120 (H) 70 - 99 mg/dL    Comment: Glucose reference range applies only to samples taken after fasting for at least 8 hours.  Comprehensive metabolic panel     Status: Abnormal   Collection Time: 05/03/21  4:43  AM  Result Value Ref Range   Sodium 137 135 - 145 mmol/L   Potassium 4.2 3.5 - 5.1 mmol/L   Chloride 105 98 - 111 mmol/L   CO2 25 22 - 32 mmol/L   Glucose, Bld 118 (H) 70 - 99 mg/dL    Comment: Glucose reference range applies only to samples taken after fasting for at least 8 hours.   BUN 15 8 - 23 mg/dL   Creatinine, Ser 0.57 0.44 - 1.00 mg/dL   Calcium 8.5 (L) 8.9 - 10.3 mg/dL   Total Protein 5.7 (L) 6.5 - 8.1 g/dL   Albumin 2.9 (L) 3.5 - 5.0 g/dL   AST 30 15 - 41 U/L   ALT 61 (H) 0 - 44 U/L   Alkaline Phosphatase 74 38 - 126 U/L   Total Bilirubin 0.4 0.3 - 1.2 mg/dL   GFR, Estimated >60 >60 mL/min    Comment: (NOTE) Calculated using the CKD-EPI Creatinine Equation (2021)    Anion gap 7 5 - 15    Comment: Performed at [facility], [place] [address] [place],  [address]  CBC with Differential/Platelet     Status: Abnormal   Collection Time: 05/03/21  4:43 AM  Result Value Ref Range   WBC 10.2 4.0 - 10.5 K/uL  RBC 4.15 3.87 - 5.11 MIL/uL   Hemoglobin 12.9 12.0 - 15.0 g/dL   HCT 38.3 36.0 - 46.0 %   MCV 92.3 80.0 - 100.0 fL   MCH 31.1 26.0 - 34.0 pg   MCHC 33.7 30.0 - 36.0 g/dL   RDW 12.5 11.5 - 15.5 %   Platelets 285 150 - 400 K/uL   nRBC 0.0 0.0 - 0.2 %   Neutrophils Relative % 79 %   Neutro Abs 8.0 (H) 1.7 - 7.7 K/uL   Lymphocytes Relative 12 %   Lymphs Abs 1.3 0.7 - 4.0 K/uL   Monocytes Relative 8 %   Monocytes Absolute 0.8 0.1 - 1.0 K/uL   Eosinophils Relative 0 %   Eosinophils Absolute 0.0 0.0 - 0.5 K/uL   Basophils Relative 0 %   Basophils Absolute 0.0 0.0 - 0.1 K/uL   Immature Granulocytes 1 %   Abs Immature Granulocytes 0.10 (H) 0.00 - 0.07 K/uL    Comment: Performed at [facility] [address] [place], [place] [address]  Magnesium     Status: None   Collection Time: 05/03/21  4:43 AM  Result Value Ref Range   Magnesium 2.1 1.7 - 2.4 mg/dL    Comment: Performed at [facility] [address] [place], [place] [address]   Phosphorus     Status: None   Collection Time: 05/03/21  4:43 AM  Result Value Ref Range   Phosphorus 3.3 2.5 - 4.6 mg/dL    Comment: Performed at [facility] [address], [place], [place] [address]  Glucose, capillary     Status: None   Collection Time: 05/03/21  7:50 AM  Result Value Ref Range   Glucose-Capillary 96 70 - 99 mg/dL    Comment: Glucose reference range applies only to samples taken after fasting for at least 8 hours.  Glucose, capillary     Status: Abnormal   Collection Time: 05/03/21 11:02 AM  Result Value Ref Range   Glucose-Capillary 109 (H) 70 - 99 mg/dL    Comment: Glucose reference range applies only to samples taken after fasting for at least 8 hours.  Glucose, capillary     Status: Abnormal   Collection Time: 05/03/21  4:03 PM  Result Value Ref Range   Glucose-Capillary 109 (H) 70 - 99 mg/dL    Comment: Glucose reference range applies only to samples taken after fasting for at least 8 hours.   No results found.     Medical Problem List and Plan: 1.  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  Pathology reports pending  -patient may shower but incisions must be covered.   -ELOS/Goals: 10-14 days modI  -Admit to CIR 2.  Antithrombotics: -DVT/anticoagulation: SCDs Mechanical: Sequential compression devices, below knee Bilateral lower extremities  -antiplatelet therapy: N/A 3. Postoperative pain: Continue Hydrocodone as needed 4. Anxiety: Conntinue BuSpar 7.5 mg twice daily as needed ,Ativan 1 mg every 6 hours as needed anxiety  -antipsychotic agents: N/A 5. Neuropsych: This patient is capable of making decisions on her own behalf. 6. Skin/Wound Care: Routine skin checks 7. Fluids/Electrolytes/Nutrition: Routine in and outs with follow-up  chemistries 8.  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  Continue Neurontin 600 mg twice daily. Continue use of AFO.  9.  Seizure prophylaxis.  EEG negative.  Keppra 1000 mg twice daily 10.  Restless leg syndrome.  Continue Requip 4 mg nightly 11.  GERD.  Protonix 12.  History of left breast cancer 2015.  Follow-up outpatient 13.  Hepatic Steatosis intermittently elevated since 2018.  Follow-up chemistries 14.  Steroid-induced hyperglycemia.  Hemoglobin A1c 5.5.  SSI  I have personally performed a face to face diagnostic evaluation, including, but not limited to relevant history and physical exam findings, of this patient and developed relevant assessment and plan.  Additionally, I have reviewed and concur with the physician assistant's documentation above.  Leeroy Cha, MD  Lavon Paganini [place], PA-C 05/03/2021

## 2021-05-03 NOTE — Progress Notes (Signed)
Inpatient Rehabilitation Medication Review by a Pharmacist  A complete drug regimen review was completed for this patient to identify any potential clinically significant medication issues.  Clinically significant medication issues were identified:  no  Check AMION for pharmacist assigned to patient if future medication questions/issues arise during this admission.   Time spent performing this drug regimen review (minutes):  43mins   Berta Minor 05/03/2021 7:16 PM

## 2021-05-04 ENCOUNTER — Ambulatory Visit (HOSPITAL_BASED_OUTPATIENT_CLINIC_OR_DEPARTMENT_OTHER): Payer: Medicare Other | Admitting: Physical Therapy

## 2021-05-04 DIAGNOSIS — D496 Neoplasm of unspecified behavior of brain: Secondary | ICD-10-CM

## 2021-05-04 LAB — CBC WITH DIFFERENTIAL/PLATELET
Abs Immature Granulocytes: 0.1 10*3/uL — ABNORMAL HIGH (ref 0.00–0.07)
Basophils Absolute: 0 10*3/uL (ref 0.0–0.1)
Basophils Relative: 0 %
Eosinophils Absolute: 0 10*3/uL (ref 0.0–0.5)
Eosinophils Relative: 0 %
HCT: 39.4 % (ref 36.0–46.0)
Hemoglobin: 12.9 g/dL (ref 12.0–15.0)
Immature Granulocytes: 1 %
Lymphocytes Relative: 13 %
Lymphs Abs: 1.4 10*3/uL (ref 0.7–4.0)
MCH: 30.3 pg (ref 26.0–34.0)
MCHC: 32.7 g/dL (ref 30.0–36.0)
MCV: 92.5 fL (ref 80.0–100.0)
Monocytes Absolute: 0.6 10*3/uL (ref 0.1–1.0)
Monocytes Relative: 6 %
Neutro Abs: 8.5 10*3/uL — ABNORMAL HIGH (ref 1.7–7.7)
Neutrophils Relative %: 80 %
Platelets: 308 10*3/uL (ref 150–400)
RBC: 4.26 MIL/uL (ref 3.87–5.11)
RDW: 12.6 % (ref 11.5–15.5)
WBC: 10.7 10*3/uL — ABNORMAL HIGH (ref 4.0–10.5)
nRBC: 0 % (ref 0.0–0.2)

## 2021-05-04 LAB — GLUCOSE, CAPILLARY
Glucose-Capillary: 116 mg/dL — ABNORMAL HIGH (ref 70–99)
Glucose-Capillary: 94 mg/dL (ref 70–99)
Glucose-Capillary: 119 mg/dL — ABNORMAL HIGH (ref 70–99)
Glucose-Capillary: 106 mg/dL — ABNORMAL HIGH (ref 70–99)

## 2021-05-04 LAB — COMPREHENSIVE METABOLIC PANEL
ALT: 66 U/L — ABNORMAL HIGH (ref 0–44)
AST: 34 U/L (ref 15–41)
Albumin: 3 g/dL — ABNORMAL LOW (ref 3.5–5.0)
Alkaline Phosphatase: 81 U/L (ref 38–126)
Anion gap: 11 (ref 5–15)
BUN: 14 mg/dL (ref 8–23)
CO2: 25 mmol/L (ref 22–32)
Calcium: 9 mg/dL (ref 8.9–10.3)
Chloride: 101 mmol/L (ref 98–111)
Creatinine, Ser: 0.52 mg/dL (ref 0.44–1.00)
GFR, Estimated: >60 mL/min (ref >60)
Glucose, Bld: 112 mg/dL — ABNORMAL HIGH (ref 70–99)
Potassium: 4 mmol/L (ref 3.5–5.1)
Sodium: 137 mmol/L (ref 135–145)
Total Bilirubin: 0.4 mg/dL (ref 0.3–1.2)
Total Protein: 5.9 g/dL — ABNORMAL LOW (ref 6.5–8.1)

## 2021-05-04 MED ORDER — HYDROCODONE-ACETAMINOPHEN 5-325 MG PO TABS: 1.0000 | ORAL_TABLET | Freq: Four times a day (QID) | ORAL | Status: DC | PRN

## 2021-05-04 NOTE — Progress Notes (Signed)
Inpatient Rehabilitation Care Coordinator Assessment and Plan Patient Details  Name: Tara [facility] MRN: [id number] Date of Birth: [birth year]  Today's Date: 05/04/2021  Hospital Problems: Principal Problem:   Brain tumor [facility])  Past Medical History:  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[place])    S/p total hip replacement left   Breast cancer ([place]) 2015   left    Cataract    Cholecystolithiasis    Chronic allergic rhinitis    Depression    Former smoker, stopped smoking in distant past 10/05/2019   Quit 2000; 17 y smoking history   GERD (gastroesophageal reflux disease)    Hepatic steatosis 06/08/2020   Intermittent elevated LFTs and ultrasound 2018   Hyperlipemia    IBS (irritable bowel syndrome) 10/08/2018   Nl colonoscopy and EGD 09/2018   IBS (irritable bowel syndrome)    Lichen plano-pilaris    Major depression, 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 Repeat 2020   Urinary tract infection    hx of frequent    Past Surgical History:  Past Surgical History:  Procedure Laterality Date   APPENDECTOMY     APPLICATION OF CRANIAL NAVIGATION Right 04/30/2021   Procedure: APPLICATION OF CRANIAL NAVIGATION;  Surgeon: Earnie Larsson, MD;  Location: [place];  Service: Neurosurgery;  Laterality: Right;   BREAST LUMPECTOMY Left 2015   cataracts     CHOLECYSTECTOMY N/A 10/29/2016   Procedure: LAPAROSCOPIC CHOLECYSTECTOMY;  Surgeon: Erroll Luna, MD;  Location: [facility];  Service: General;  Laterality: N/A;   COLONOSCOPY     CRANIOTOMY Right 04/30/2021   Procedure: Right Parietal Craniotomy for tumor with Brain Lab;  Surgeon: Earnie Larsson, MD;  Location:  [place];  Service: Neurosurgery;  Laterality: Right;   LIPOSUCTION     TOTAL HIP ARTHROPLASTY     bilateral hip arthoplasty   TOTAL HIP ARTHROPLASTY Right 12/18/2015   Procedure: RIGHT TOTAL HIP ARTHROPLASTY ANTERIOR APPROACH;  Surgeon: Gaynelle Arabian, MD;  Location: [facility];  Service: Orthopedics;  Laterality: Right;   TOTAL KNEE ARTHROPLASTY     TOTAL KNEE ARTHROPLASTY Left 11/25/2016   Procedure: LEFT TOTAL KNEE ARTHROPLASTY;  Surgeon: Gaynelle Arabian, MD;  Location: [facility];  Service: Orthopedics;  Laterality: Left;  with abductor block   TUBAL LIGATION     UPPER GASTROINTESTINAL ENDOSCOPY     Social History:  reports that she quit smoking about 29 years ago. Her smoking use included cigarettes. She smoked an average of 1 pack per day. She has never used smokeless tobacco. She reports current alcohol use of about 14.0 standard drinks per week. She reports that she does not use drugs.  Family / Support Systems Marital Status: Married Patient Roles: Spouse, Parent Spouse/Significant Other: Coralyn Mark [phone] Children: Shannon-daughter local Another daughter in [place] Other Supports: Friends Anticipated Caregiver: Husband and daughter Ability/Limitations of Caregiver: Husband has back issues-bulging disc can only do so much physical care Caregiver Availability: 24/7 Family Dynamics: Close knit with both daughter's this is pt's second marriage but all get along well. Moved here from [place] to be closer to daughter and grandchildren.  Social History Preferred language: English Religion:  Unknown Cultural Background: No issues Education: College Read: Yes Write: Yes Employment Status: Retired Public relations account executive Issues: No issues Guardian/Conservator: None-according to MD pt is capable of making her own decisions while here. Husband will be here daily to provide support   Abuse/Neglect Abuse/Neglect Assessment Can Be Completed: Yes Physical Abuse: Denies Verbal Abuse:  Denies Sexual Abuse: Denies Exploitation of patient/patient's resources: Denies Self-Neglect: Denies  Emotional Status Pt's affect, behavior and adjustment status: Pt is motivated to do well , she doesn;t want to burden husband due to his back issues. She hopes to be supervision level at least and may do better than this. She was having issues prior to diagnosis and struggled with her ambulating and was falling everywhere Recent Psychosocial Issues: other health issues-glad to finally know what was wrong with here Psychiatric History: History of depession/anxiety takes medications for this and finds them helpful. Pt is very optimistic regarding treatment options and will tackle one thing at a time. Good support from family Substance Abuse History: No issues  Patient / Family Perceptions, Expectations & Goals Pt/Family understanding of illness & functional limitations: Pt and husband can explain her brain mets and lung cancer. Still discussing with MD appointments and plan once rehab complete. Pt and husband feel questions and concerns have been addressed and want to be kept in the loop of any medical information. Aware once has rad/onc appointment will know more will happen as an OP Premorbid pt/family roles/activities: Wife, mother, grandmother, retiree, friend, church member, etc Anticipated changes in roles/activities/participation: resume Pt/family expectations/goals: Pt states: " I am glad to know what was casuing me to fall I knew I was not crazy. "  Husband states: " We will do what she needs she is very independent though."  [employer]: None Premorbid Home Care/DME Agencies: Other (Comment) (tub seat and rollator on order from [facility] 8/24) Transportation available at discharge: husband and daughter-pt drove prior to admission Resource referrals recommended: Neuropsychology  Discharge Planning Living Arrangements: Spouse/significant other Support Systems:  Spouse/significant other, Children, Friends/neighbors, Church/faith community Type of Residence: Private residence Insurance Resources: [employer], Multimedia programmer (specify) Web designer) Financial Resources: [place], Family Support Financial Screen Referred: No Living Expenses: Own Money Management: Patient, Spouse Does the patient have any problems obtaining your medications?: No Home Management: Both she and husband Patient/Family Preliminary Plans: Return home with husband who can be there and provide supervision levle due to back issues. Daughrer does work and has children but will assist when can. Aware team evaluating and setting goals today. Care Coordinator Barriers to Discharge: Decreased caregiver support Care Coordinator Barriers to Discharge Comments: husband has back issues Care Coordinator Anticipated Follow Up Needs: HH/OP  Clinical Impression Pleasant optimistic female who is ready to work and recover from her surgery so she can move on to the next phase with her cancer. She is glad to finally now what is wrong with her and hopeful she will have treatment options to treat her lung cancer. She is strong willed and likes to talk. Will await therapist evaluations and work on discharge needs. May benefit from seeing neuro-psych while here if able  Elease Hashimoto 05/04/2021, 10:58 AM

## 2021-05-04 NOTE — Evaluation (Signed)
Physical Therapy Assessment and Plan  Patient Details  Name: Tara [facility] MRN: [id number] Date of Birth: 05/31/1946  PT Diagnosis: Abnormality of gait, Difficulty walking, Hemiplegia non-dominant, and Muscle weakness Rehab Potential: Good ELOS: 2 weeks   Today's Date: 05/04/2021 PT Individual Time: 1448-1856 PT Individual Time Calculation (min): 69 min    Hospital Problem: Principal Problem:   Brain tumor [facility])   Past Medical History:  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[place])    S/p total hip replacement left   Breast cancer ([place]) 2015   left    Cataract    Cholecystolithiasis    Chronic allergic rhinitis    Depression    Former smoker, stopped smoking in distant past 10/05/2019   Quit 2000; 5 y smoking history   GERD (gastroesophageal reflux disease)    Hepatic steatosis 06/08/2020   Intermittent elevated LFTs and ultrasound 2018   Hyperlipemia    IBS (irritable bowel syndrome) 10/08/2018   Nl colonoscopy and EGD 09/2018   IBS (irritable bowel syndrome)    Lichen plano-pilaris    Major depression, 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 Repeat 2020   Urinary tract infection    hx of frequent    Past Surgical History:  Past Surgical History:  Procedure Laterality Date   APPENDECTOMY     APPLICATION OF CRANIAL NAVIGATION Right 04/30/2021   Procedure: APPLICATION OF CRANIAL NAVIGATION;  Surgeon: Earnie Larsson, MD;  Location: [place];  Service: Neurosurgery;  Laterality: Right;   BREAST LUMPECTOMY Left 2015   cataracts     CHOLECYSTECTOMY N/A 10/29/2016   Procedure: LAPAROSCOPIC CHOLECYSTECTOMY;  Surgeon: Erroll Luna, MD;   Location: [place];  Service: General;  Laterality: N/A;   COLONOSCOPY     CRANIOTOMY Right 04/30/2021   Procedure: Right Parietal Craniotomy for tumor with Brain Lab;  Surgeon: Earnie Larsson, MD;  Location: [place];  Service: Neurosurgery;  Laterality: Right;   LIPOSUCTION     TOTAL HIP ARTHROPLASTY     bilateral hip arthoplasty   TOTAL HIP ARTHROPLASTY Right 12/18/2015   Procedure: RIGHT TOTAL HIP ARTHROPLASTY ANTERIOR APPROACH;  Surgeon: Gaynelle Arabian, MD;  [facility];  Service: Orthopedics;  Laterality: Right;   TOTAL KNEE ARTHROPLASTY     TOTAL KNEE ARTHROPLASTY Left 11/25/2016   Procedure: LEFT TOTAL KNEE ARTHROPLASTY;  Surgeon: Gaynelle Arabian, MD;  [facility];  Service: Orthopedics;  Laterality: Left;  with abductor block   TUBAL LIGATION     UPPER GASTROINTESTINAL ENDOSCOPY      Assessment & Plan Clinical Impression:  Tara [facility] is a 75 year old right-handed female with history of left breast cancer 2015 with radiation therapy, Hepatic steatosis intermittently elevated since 2018, right total hip and left total knee arthroplasty, quit smoking 29 years ago, history of recurrent UTIs, depression, 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  Per chart review lives with spouse.  1 level home one-step to entry.  Ambulates household distances with the use of a Rollator and has had a history of falls that began in February but was independent prior.  Presented 04/24/2021 after  seizure-like activity left side jerking movements.  CT/MRI of the head showed a hemorrhagic lesion of the paramedian superior right frontal lobe measuring 2.6 x 1.6 cm.  There is moderate surrounding vasogenic edema.  The midline structures were normal.  Findings most consistent with solitary metastasis.  CT of the chest showed a large spiculated left upper lobe suprahilar mass consistent with primary bronchogenic malignancy.  EEG was negative for seizure.  She had  been loaded with Keppra and remains on 1000 mg twice daily.  Patient underwent bilateral parietal craniotomy with resection of brain tumor microdissection intraoperative stereotactic navigation for volumetric resection 04/30/2021 per Dr. Annette Stable.  Decadron protocol as indicated.  Pathology report remains pending and neurosurgery plans to discuss with radiation oncology in regards to postoperative SRS treatment.  Tolerating a regular consistency diet.  Therapy evaluations completed due to patient decreased functional mobility was admitted for a comprehensive rehab program. Patient transferred to CIR on 05/03/2021 .   Patient currently requires mod with mobility secondary to muscle weakness, decreased cardiorespiratoy endurance, decreased coordination, and decreased standing balance, decreased postural control, hemiplegia, and decreased balance strategies.  Prior to hospitalization, patient was modified independent  with mobility and lived with Spouse in a House home.  Home access is 1 (threshold height)Stairs to enter.  Patient will benefit from skilled PT intervention to maximize safe functional mobility, minimize fall risk, and decrease caregiver burden for planned discharge home with 24 hour supervision.  Anticipate patient will benefit from follow up OP at discharge.  PT - End of Session Activity Tolerance: Tolerates < 10 min activity, no significant change in vital signs Endurance Deficit: Yes Endurance Deficit Description: Required frequent breaks 2/2 fatigue PT Assessment Rehab Potential (ACUTE/IP ONLY): Good PT Barriers to Discharge: Decreased caregiver support;Lack of/limited family support PT Barriers to Discharge Comments: Significant strength deficits in LLE with husband possible unable to provide physical assist PT Patient demonstrates impairments in the following area(s): Balance;Behavior;Edema;Endurance;Motor;Pain;Perception;Safety;Sensory;Skin Integrity PT Transfers Functional Problem(s): Bed  Mobility;Bed to Chair;Car;Furniture PT Locomotion Functional Problem(s): Ambulation;Wheelchair Mobility;Stairs PT Plan PT Intensity: Minimum of 1-2 x/day ,45 to 90 minutes PT Frequency: 5 out of 7 days PT Duration Estimated Length of Stay: 2 weeks PT Treatment/Interventions: Ambulation/gait training;Discharge planning;Functional mobility training;Psychosocial support;Therapeutic Activities;Visual/perceptual remediation/compensation;Balance/vestibular training;Disease management/prevention;Neuromuscular re-education;Skin care/wound management;Therapeutic Exercise;Wheelchair propulsion/positioning;Cognitive remediation/compensation;DME/adaptive equipment instruction;Pain management;Splinting/orthotics;UE/LE Strength taining/ROM;Community reintegration;Patient/family education;Functional electrical stimulation;Stair training;UE/LE Coordination activities PT Transfers Anticipated Outcome(s): Supervision PT Locomotion Anticipated Outcome(s): CGA with LRAD PT Recommendation Follow Up Recommendations: None Patient destination: Home Equipment Recommended: To be determined   PT Evaluation Precautions/Restrictions Precautions Precautions: Fall Precaution Comments: seizure, LLE weakness/foot drop Required Braces or Orthoses: Other Brace Other Brace: L AFO Restrictions Weight Bearing Restrictions: No General   Pain Pain Assessment Pain Scale: 0-10 Pain Score: 0-No pain Faces Pain Scale: No hurt Pain Type: Acute pain Pain Location: Head Pain Orientation: Posterior Pain Descriptors / Indicators: Aching;Headache Patients Stated Pain Goal: 2 Pain Intervention(s): Medication (See eMAR);Rest Home Living/Prior Functioning Home Living Living Arrangements: Spouse/significant other Available Help at Discharge: Family;Available 24 hours/day Type of Home: House Home Access: Stairs to enter [employer] of Steps: 1 (threshold height) Entrance Stairs-Rails: None Home Layout: One  level Bathroom Shower/Tub: Multimedia programmer: Standard Bathroom Accessibility: Yes Additional Comments: Husband unable provide any heavy lifting most likely 2/2 herniated disk. Pt states she has grab bars around commode and in the process of installing grab bar in shower  Lives With: Spouse Prior Function Level of Independence: Requires assistive device for independence;Independent with  basic ADLs;Independent with transfers;Other (comment);Independent with gait (Weakness noticable in June, started using rollator at this time prior to current hospitalization)  Able to Take Stairs?: Yes Driving: Yes Vocation: Retired Art gallery manager: Within Advertising copywriter Praxis Praxis: Intact  Cognition Overall Cognitive Status: Within Functional Limits for tasks assessed Arousal/Alertness: Awake/alert Orientation Level: Oriented X4 Safety/Judgment: Appears intact Sensation Sensation Light Touch: Impaired by gross assessment (pt reports baseline neuropathy BLE (LLE>RLE)) Coordination Gross Motor Movements are Fluid and Coordinated: No Fine Motor Movements are Fluid and Coordinated: No Coordination and Movement Description: LLE>LUE hemi + L foot drop Finger Nose Finger Test: moderate dysmetria on L Heel Shin Test: unable LLE limited by strength deficits Motor  Motor Motor: Hemiplegia Motor - Skilled Clinical Observations: LLE > LUE hemi, L foot drop   Trunk/Postural Assessment  Cervical Assessment Cervical Assessment: Within Functional Limits Thoracic Assessment Thoracic Assessment: Exceptions to [facility] (rounded shoulders) Lumbar Assessment Lumbar Assessment: Exceptions to [facility] (posterior pelvic tilt) Postural Control Postural Control: Deficits on evaluation (decreased with dynamic tasks 2/2 LLE weakness)  Balance Balance Balance Assessed: Yes Static Sitting Balance Static Sitting - Balance Support: Feet supported Static Sitting - Level of  Assistance: 6: Modified independent (Device/Increase time) Dynamic Sitting Balance Dynamic Sitting - Balance Support: Feet supported;No upper extremity supported Dynamic Sitting - Level of Assistance: 5: Stand by assistance Dynamic Sitting Balance - Compensations: S Static Standing Balance Static Standing - Balance Support: Bilateral upper extremity supported;During functional activity Static Standing - Level of Assistance: 5: Stand by assistance Static Standing - Comment/# of Minutes: CGA Dynamic Standing Balance Dynamic Standing - Balance Support: No upper extremity supported;During functional activity Dynamic Standing - Level of Assistance: 4: Min assist Dynamic Standing - Comments: With donning/doffing underwear for bedside commode use Extremity Assessment  RLE Assessment RLE Assessment: Within Functional Limits RLE Strength Right Hip Flexion: 4+/5 Right Hip ABduction: 5/5 Right Hip ADduction: 5/5 Right Knee Flexion: 5/5 Right Ankle Dorsiflexion: 5/5 Right Ankle Plantar Flexion: 5/5 LLE Assessment LLE Assessment: Exceptions to [facility] LLE Strength Left Hip Flexion: 2+/5 Left Hip ABduction: 2/5 Left Hip ADduction: 3-/5 Left Knee Flexion: 2/5 Left Ankle Dorsiflexion: 1/5 Left Ankle Plantar Flexion: 1/5  Care Tool Care Tool Bed Mobility Roll left and right activity   Roll left and right assist level: Minimal Assistance - Patient > 75%    Sit to lying activity   Sit to lying assist level: Moderate Assistance - Patient 50 - 74%    Lying to sitting edge of bed activity   Lying to sitting edge of bed assist level: Moderate Assistance - Patient 50 - 74%     Care Tool Transfers Sit to stand transfer   Sit to stand assist level: Moderate Assistance - Patient 50 - 74%    Chair/bed transfer   Chair/bed transfer assist level: Moderate Assistance - Patient 50 - 74%     Toilet transfer   Assist Level: Moderate Assistance - Patient 50 - 74%    Car transfer Car transfer activity  did not occur: Safety/medical concerns        Care Tool Locomotion Ambulation   Assist level: Moderate Assistance - Patient 50 - 74% Assistive device: Walker-rolling Max distance: 15  Walk 10 feet activity   Assist level: Moderate Assistance - Patient - 50 - 74% Assistive device: Walker-rolling   Walk 50 feet with 2 turns activity Walk 50 feet with 2 turns activity did not occur: Safety/medical concerns      Walk 150 feet activity  Walk 150 feet activity did not occur: Safety/medical concerns      Walk 10 feet on uneven surfaces activity Walk 10 feet on uneven surfaces activity did not occur: Safety/medical concerns      Stairs   Assist level: Moderate Assistance - Patient - 50 - 74% Stairs assistive device: 2 hand rails Max number of stairs: 1  Walk up/down 1 step activity Walk up/down 1 step or curb (drop down) activity did not occur: Safety/medical concerns     Walk up/down 4 steps activity did not occuR: Safety/medical concerns  Walk up/down 4 steps activity      Walk up/down 12 steps activity Walk up/down 12 steps activity did not occur: Safety/medical concerns      Pick up small objects from floor Pick up small object from the floor (from standing position) activity did not occur: Safety/medical concerns      Wheelchair       Wheelchair assist level: Minimal Assistance - Patient > 75%    Wheel 50 feet with 2 turns activity Wheelchair 50 feet with 2 turns activity did not occur: Safety/medical concerns    Wheel 150 feet activity Wheelchair 150 feet activity did not occur: Safety/medical concerns      Refer to Care Plan for Long Term Goals  SHORT TERM GOAL WEEK 1 PT Short Term Goal 1 (Week 1): Pt will consistently perform STS CGA PT Short Term Goal 2 (Week 1): Pt will ambulate 62f MinA PT Short Term Goal 3 (Week 1): Pt will complete supine<>EOB CGA PT Short Term Goal 4 (Week 1): Pt will complete standard height steps MinA  Recommendations for other  services: None   Skilled Therapeutic Intervention  Pt received sitting in wc with Social Worker present. Pt agreeable to physical therapy evaluation.  Evaluation completed (see details above) with patient education regarding purpose of PT evaluation, PT POC and goals, therapy schedule, weekly team meetings, and other CIR information including safety plan and fall risk safety.  Pt ambulated in // bars [id number] without AFO donned. 1521fW AFO donned to LLE modA. Pt able to initiate swing on LLE, however requires manual facilitation of for >75% of swing/placement for stance and weight shifting. Verbal cuing for upright posture. Pt states she is nervous about LLE knee buckle, therapist provided soft knee block progressing to no knee block.  Pt demonstrates decreased left weight shifting, right decreased step length, decreased stance time on left, downwards gaze.    Pt completed 1 3" step ModA for weight shifting and LLE facilitation. BUE support on hand rails.   Stand pivot RW transfer wc>bed<>bedside commode ModA for weight shifting and manual facilitation of LLE. Pt continent of bladder and performs peri care/brief management independently with MinA for balance and LUE on RW.   Pt returned to supine in bed modA for management of BLE. Pt with HOB/legs elevated, bed alarm set, and call bell within reach. All needs met at this time.   Mobility Bed Mobility Bed Mobility: Sit to Supine Sit to Supine: Moderate Assistance - Patient 50-74% Transfers Transfers: Sit to Stand;Stand Pivot Transfers;Stand to Sit Sit to Stand: Minimal Assistance - Patient > 75% Stand to Sit: Minimal Assistance - Patient > 75% Stand Pivot Transfers: Moderate Assistance - Patient 50 - 74% Stand Pivot Transfer Details: Verbal cues for precautions/safety;Tactile cues for initiation;Tactile cues for weight shifting;Tactile cues for posture;Verbal cues for sequencing;Manual facilitation for placement;Verbal cues for safe use of  DME/AE;Manual facilitation for weight shifting;Verbal cues for technique  Stand Pivot Transfer Details (indicate cue type and reason): manual facilitation of weight shifting, Pt able to mildly intitate hip/knee flexion requiring manual facilitation for LLE 2/2 severe weakness Transfer (Assistive device): Rolling walker Locomotion  Gait Ambulation: Yes Gait Assistance: Moderate Assistance - Patient 50-74% Assistive device: Rolling walker Gait Assistance Details: Tactile cues for initiation;Tactile cues for sequencing;Verbal cues for gait pattern;Verbal cues for sequencing;Verbal cues for precautions/safety;Manual facilitation for weight shifting;Manual facilitation for placement Gait Gait velocity: Decreased Stairs / Additional Locomotion Stairs: Yes Stairs Assistance: Moderate Assistance - Patient 50 - 74% Stair Management Technique: Two rails Number of Stairs: 1 Height of Stairs: 3   Discharge Criteria: Patient will be discharged from PT if patient refuses treatment 3 consecutive times without medical reason, if treatment goals not met, if there is a change in medical status, if patient makes no progress towards goals or if patient is discharged from hospital.  The above assessment, treatment plan, treatment alternatives and goals were discussed and mutually agreed upon: by patient  Henrene Pastor, SPT 05/04/2021, 12:39 PM

## 2021-05-04 NOTE — Plan of Care (Signed)
Problem: RH Balance Goal: LTG Patient will maintain dynamic standing with ADLs (OT) Description: LTG:  Patient will maintain dynamic standing balance with assist during activities of daily living (OT)  Flowsheets (Taken 05/04/2021 1045) LTG: Pt will maintain dynamic standing balance during ADLs with: Supervision/Verbal cueing   Problem: Sit to Stand Goal: LTG:  Patient will perform sit to stand in prep for activites of daily living with assistance level (OT) Description: LTG:  Patient will perform sit to stand in prep for activites of daily living with assistance level (OT) Flowsheets (Taken 05/04/2021 1045) LTG: PT will perform sit to stand in prep for activites of daily living with assistance level: Independent with assistive device   Problem: RH Eating Goal: LTG Patient will perform eating w/assist, cues/equip (OT) Description: LTG: Patient will perform eating with assist, with/without cues using equipment (OT) Flowsheets (Taken 05/04/2021 1045) LTG: Pt will perform eating with assistance level of: Independent with assistive device    Problem: RH Grooming Goal: LTG Patient will perform grooming w/assist,cues/equip (OT) Description: LTG: Patient will perform grooming with assist, with/without cues using equipment (OT) Flowsheets (Taken 05/04/2021 1045) LTG: Pt will perform grooming with assistance level of: Independent with assistive device    Problem: RH Bathing Goal: LTG Patient will bathe all body parts with assist levels (OT) Description: LTG: Patient will bathe all body parts with assist levels (OT) Flowsheets (Taken 05/04/2021 1045) LTG: Pt will perform bathing with assistance level/cueing: Supervision/Verbal cueing   Problem: RH Dressing Goal: LTG Patient will perform upper body dressing (OT) Description: LTG Patient will perform upper body dressing with assist, with/without cues (OT). Flowsheets (Taken 05/04/2021 1045) LTG: Pt will perform upper body dressing with assistance  level of: Independent Goal: LTG Patient will perform lower body dressing w/assist (OT) Description: LTG: Patient will perform lower body dressing with assist, with/without cues in positioning using equipment (OT) Flowsheets (Taken 05/04/2021 1045) LTG: Pt will perform lower body dressing with assistance level of: Supervision/Verbal cueing   Problem: RH Toileting Goal: LTG Patient will perform toileting task (3/3 steps) with assistance level (OT) Description: LTG: Patient will perform toileting task (3/3 steps) with assistance level (OT)  Flowsheets (Taken 05/04/2021 1045) LTG: Pt will perform toileting task (3/3 steps) with assistance level: Supervision/Verbal cueing   Problem: RH Functional Use of Upper Extremity Goal: LTG Patient will use RT/LT upper extremity as a (OT) Description: LTG: Patient will use right/left upper extremity as a stabilizer/gross assist/diminished/nondominant/dominant level with assist, with/without cues during functional activity (OT) Flowsheets (Taken 05/04/2021 1045) LTG: Use of upper extremity in functional activities: LUE as nondominant level LTG: Pt will use upper extremity in functional activity with assistance level of: Independent with assistive device   Problem: RH Simple Meal Prep Goal: LTG Patient will perform simple meal prep w/assist (OT) Description: LTG: Patient will perform simple meal prep with assistance, with/without cues (OT). Flowsheets (Taken 05/04/2021 1045) LTG: Pt will perform simple meal prep with assistance level of: Supervision/Verbal cueing   Problem: RH Toilet Transfers Goal: LTG Patient will perform toilet transfers w/assist (OT) Description: LTG: Patient will perform toilet transfers with assist, with/without cues using equipment (OT) Flowsheets (Taken 05/04/2021 1045) LTG: Pt will perform toilet transfers with assistance level of: Supervision/Verbal cueing   Problem: RH Tub/Shower Transfers Goal: LTG Patient will perform tub/shower  transfers w/assist (OT) Description: LTG: Patient will perform tub/shower transfers with assist, with/without cues using equipment (OT) Flowsheets (Taken 05/04/2021 1045) LTG: Pt will perform tub/shower stall transfers with assistance level of: Supervision/Verbal cueing  Problem: RH Furniture Transfers Goal: LTG Patient will perform furniture transfers w/assist (OT/PT) Description: LTG: Patient will perform furniture transfers  with assistance (OT/PT). Flowsheets (Taken 05/04/2021 1045) LTG: Pt will perform furniture transfers with assist:: Supervision/Verbal cueing

## 2021-05-04 NOTE — Progress Notes (Signed)
Occupational Therapy Session Note  Patient Details  Name: Tara Murphy MRN: [id number] Date of Birth: 13-Apr-1946  Today's Date: 05/04/2021 OT Individual Time: 6578-4696 OT Individual Time Calculation (min): 57 min    Short Term Goals: Week 1:  OT Short Term Goal 1 (Week 1): Pt will complete toilet transfer with CGA + LRAD. OT Short Term Goal 2 (Week 1): Pt will complete 3/3 steps of toileting with min A. OT Short Term Goal 3 (Week 1): Pt will be able to ind verbalize at least 2 energy conservation strategies to incorporate into daily routine. OT Short Term Goal 4 (Week 1): Pt will stand >5 min to complete ADL with close S + LRAD.  Skilled Therapeutic Interventions/Progress Updates:  Treatment session with focus on fine and gross motor control, awareness and problem solving, and discussion of OT and personal goals.  Pt received semi-reclined in bed with daughter present at beginning of session but then leaving.  Engaged in lengthy discussion about pt's personal goals and what she wants to get from her time in rehab to allow for her to be stronger for radiation and possible chemotherapy and still allow herself to be as independent as possible and engage in leisure pursuits.  Discussed OT goals in alignment with personal goals.  Engaged in 9 hole peg test (R: 31 and L: 40 seconds) and Box and Blocks (R: 40 and L:33 in 60 second time limit).  Noted pt to rely heavily on visual feedback when utilizing LUE as pt "dragging" items across container and barrier for feedback.  Engaged in picture replication with large colored blocks to challenge awareness and problem solving as needed for ADLs and IADLs.  Pt requiring mod cues for awareness and problem solving of errors.  Therapist related carryover of table top task to functional task.  Pt also voiced her concerns with her husband being overwhelmed in caring for her.  Pt returned to supine requiring assistance to lift LLE in to bed.  Pt remained semi-reclined  in bed with all needs in reach.  Therapy Documentation Precautions:  Precautions Precautions: Fall Precaution Comments: seizure, LLE weakness/foot drop Required Braces or Orthoses: Other Brace Other Brace: L AFO Restrictions Weight Bearing Restrictions: No General: General Chart Reviewed: Yes Family/Caregiver Present: No Vital Signs: Therapy Vitals Temp: 98.1 F (36.7 C) Temp Source: Oral Pulse Rate: 79 Resp: 17 BP: 129/64 Patient Position (if appropriate): Sitting Oxygen Therapy SpO2: 96 % O2 Device: Room Air Pain: Pain Assessment Pain Scale: 0-10 Pain Score: 0-No pain Faces Pain Scale: No hurt  Therapy/Group: Individual Therapy  Simonne Come 05/04/2021, 2:43 PM

## 2021-05-04 NOTE — Progress Notes (Signed)
Patient arrived at approximately 1700, husband Coralyn Mark accompanying patient. Patient is A&O x 4 and able to make her needs known. Denies pain or discomfort at this time. Staples noted to top of patients head, CDI. Left foot drop noted. Minimal movement to left leg/foot. Patient however does have sensation. Understands the use of the call light. Bed in low position, call light and personal items within reach

## 2021-05-04 NOTE — Progress Notes (Signed)
PROGRESS NOTE   Subjective/Complaints: She is in cheerful mood this morning and is very happy with her care here so far. Slept well last night Does c/o of some right sided frontal headache which she says she gets with fatigue  ROS: +right sided frontal headache Objective:   No results found. Recent Labs    05/03/21 0443 05/04/21 0543  WBC 10.2 10.7*  HGB 12.9 12.9  HCT 38.3 39.4  PLT 285 308   Recent Labs    05/03/21 0443 05/04/21 0543  NA 137 137  K 4.2 4.0  CL 105 101  CO2 25 25  GLUCOSE 118* 112*  BUN 15 14  CREATININE 0.57 0.52  CALCIUM 8.5* 9.0    Intake/Output Summary (Last 24 hours) at 05/04/2021 1033 Last data filed at 05/04/2021 0804 Gross per 24 hour  Intake 600 ml  Output --  Net 600 ml        Physical Exam: Vital Signs Blood pressure 126/61, pulse 70, temperature 98.6 F (37 C), resp. rate 18, height 5\' 6"  (1.676 m), weight 95.9 kg, SpO2 96 %. Gen: no distress, normal appearing HEENT: oral mucosa pink and moist, NCAT Cardio: Reg rate Chest: normal effort, normal rate of breathing Abd: soft, non-distended Ext: no edema Psych: pleasant, normal affect Skin: Craniotomy site clean and dry Neurological:     Comments: Patient a bit lethargic but arousable.  Makes eye contact with examiner.  Follows commands.  Provides name and age. Left sided foot drop 0/5 DF    Assessment/Plan: 1. Functional deficits which require 3+ hours per day of interdisciplinary therapy in a comprehensive inpatient rehab setting. Physiatrist is providing close team supervision and 24 hour management of active medical problems listed below. Physiatrist and rehab team continue to assess barriers to discharge/monitor patient progress toward functional and medical goals  Care Tool:  Bathing              Bathing assist       Upper Body Dressing/Undressing Upper body dressing   What is the patient wearing?:  Hospital gown only    Upper body assist Assist Level: Minimal Assistance - Patient > 75%    Lower Body Dressing/Undressing Lower body dressing            Lower body assist       Toileting Toileting    Toileting assist Assist for toileting: Moderate Assistance - Patient 50 - 74%     Transfers Chair/bed transfer  Transfers assist     Chair/bed transfer assist level: Moderate Assistance - Patient 50 - 74%     Locomotion Ambulation   Ambulation assist              Walk 10 feet activity   Assist           Walk 50 feet activity   Assist           Walk 150 feet activity   Assist           Walk 10 feet on uneven surface  activity   Assist           Wheelchair     Assist  Wheelchair 50 feet with 2 turns activity    Assist            Wheelchair 150 feet activity     Assist          Blood pressure 126/61, pulse 70, temperature 98.6 F (37 C), resp. rate 18, height 5\' 6"  (1.676 m), weight 95.9 kg, SpO2 96 %.  Medical Problem List and Plan: 1.  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  Pathology reports pending             -patient may shower but incisions must be covered.              -ELOS/Goals: 10-14 days modI             Initial CIR evaluations today 2.  Antithrombotics: -DVT/anticoagulation: SCDs Mechanical: Sequential compression devices, below knee Bilateral lower extremities             -antiplatelet therapy: N/A 3. Postoperative pain: Decrease Norco to 1 tab q6H prn.  4. Anxiety: Conntinue BuSpar 7.5 mg twice daily as needed ,Ativan 1 mg every 6 hours as needed anxiety             -antipsychotic agents: N/A 5. Neuropsych: This patient is capable of making decisions on her  own behalf. 6. Skin/Wound Care: Routine skin checks 7. Fluids/Electrolytes/Nutrition: Routine in and outs with follow-up chemistries 8.  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  Continue Neurontin 600 mg twice daily. Continue use of AFO.  9.  Seizure prophylaxis.  EEG negative.  Keppra 1000 mg twice daily. Continue until 8/15 and then wean 10.  Restless leg syndrome.  Continue Requip 4 mg nightly 11.  GERD.  Protonix 12.  History of left breast cancer 2015.  Follow-up outpatient 13.  Hepatic Steatosis intermittently elevated since 2018.  Follow-up chemistries 14.  Steroid-induced hyperglycemia.  Hemoglobin A1c 5.5.  SSI 15. Leukocytosis: no signs of infection, mild, repeat Monday.   LOS: 1 days A FACE TO FACE EVALUATION WAS PERFORMED  Tara Murphy Quattrone 05/04/2021, 10:33 AM

## 2021-05-04 NOTE — Progress Notes (Signed)
Inpatient Rehabilitation Center Individual Statement of Services  Patient Name:  Tara Murphy  Date:  05/04/2021  Welcome to the [facility].  Our goal is to provide you with an individualized program based on your diagnosis and situation, designed to meet your specific needs.  With this comprehensive rehabilitation program, you will be expected to participate in at least 3 hours of rehabilitation therapies Monday-Friday, with modified therapy programming on the weekends.  Your rehabilitation program will include the following services:  Physical Therapy (PT), Occupational Therapy (OT), 24 hour per day rehabilitation nursing, Therapeutic Recreaction (TR), Neuropsychology, Care Coordinator, Rehabilitation Medicine, Nutrition Services, and Pharmacy Services  Weekly team conferences will be held on Tuesday to discuss your progress.  Your Inpatient Rehabilitation Care Coordinator will talk with you frequently to get your input and to update you on team discussions.  Team conferences with you and your family in attendance may also be held.  Expected length of stay: 12-14 days  Overall anticipated outcome: supervision with cueing  Depending on your progress and recovery, your program may change. Your Inpatient Rehabilitation Care Coordinator will coordinate services and will keep you informed of any changes. Your Inpatient Rehabilitation Care Coordinator's name and contact numbers are listed  below.  The following services may also be recommended but are not provided by the [facility] will be made to provide these services after discharge if needed.  Arrangements include referral to agencies that provide these services.  Your insurance has been verified to be:  [place] primary doctor is:  Billey Chang   Pertinent  information will be shared with your doctor and your insurance company.  Inpatient Rehabilitation Care Coordinator:  Ovidio Kin, [place] or [phone]  Information discussed with and copy given to patient by: Elease Hashimoto, 05/04/2021, 11:00 AM

## 2021-05-04 NOTE — Plan of Care (Signed)
  Problem: RH Furniture Transfers Goal: LTG Patient will perform furniture transfers w/assist (OT/PT) Description: LTG: Patient will perform furniture transfers  with assistance (OT/PT). Flowsheets (Taken 05/04/2021 2022) LTG: Pt will perform furniture transfers with assist:: Supervision/Verbal cueing   Problem: RH Balance Goal: LTG Patient will maintain dynamic standing balance (PT) Description: LTG:  Patient will maintain dynamic standing balance with assistance during mobility activities (PT) Flowsheets (Taken 05/04/2021 2022) LTG: Pt will maintain dynamic standing balance during mobility activities with:: Supervision/Verbal cueing   Problem: Sit to Stand Goal: LTG:  Patient will perform sit to stand with assistance level (PT) Description: LTG:  Patient will perform sit to stand with assistance level (PT) Flowsheets (Taken 05/04/2021 2022) LTG: PT will perform sit to stand in preparation for functional mobility with assistance level: Supervision/Verbal cueing   Problem: RH Bed Mobility Goal: LTG Patient will perform bed mobility with assist (PT) Description: LTG: Patient will perform bed mobility with assistance, with/without cues (PT). Flowsheets (Taken 05/04/2021 2022) LTG: Pt will perform bed mobility with assistance level of: Supervision/Verbal cueing   Problem: RH Bed to Chair Transfers Goal: LTG Patient will perform bed/chair transfers w/assist (PT) Description: LTG: Patient will perform bed to chair transfers with assistance (PT). Flowsheets (Taken 05/04/2021 2022) LTG: Pt will perform Bed to Chair Transfers with assistance level: Supervision/Verbal cueing   Problem: RH Car Transfers Goal: LTG Patient will perform car transfers with assist (PT) Description: LTG: Patient will perform car transfers with assistance (PT). Flowsheets (Taken 05/04/2021 2022) LTG: Pt will perform car transfers with assist:: Contact Guard/Touching assist   Problem: RH Ambulation Goal: LTG Patient  will ambulate in controlled environment (PT) Description: LTG: Patient will ambulate in a controlled environment, # of feet with assistance (PT). Flowsheets (Taken 05/04/2021 2022) LTG: Pt will ambulate in controlled environ  assist needed:: Contact Guard/Touching assist LTG: Ambulation distance in controlled environment: 150 Goal: LTG Patient will ambulate in home environment (PT) Description: LTG: Patient will ambulate in home environment, # of feet with assistance (PT). Flowsheets (Taken 05/04/2021 2022) LTG: Pt will ambulate in home environ  assist needed:: Contact Guard/Touching assist LTG: Ambulation distance in home environment: 50   Problem: RH Wheelchair Mobility Goal: LTG Patient will propel w/c in controlled environment (PT) Description: LTG: Patient will propel wheelchair in controlled environment, # of feet with assist (PT) Flowsheets (Taken 05/04/2021 2022) LTG: Pt will propel w/c in controlled environ  assist needed:: Supervision/Verbal cueing LTG: Propel w/c distance in controlled environment: 150   Problem: RH Stairs Goal: LTG Patient will ambulate up and down stairs w/assist (PT) Description: LTG: Patient will ambulate up and down # of stairs with assistance (PT) Flowsheets (Taken 05/04/2021 2022) LTG: Pt will ambulate up/down stairs assist needed:: Contact Guard/Touching assist LTG: Pt will  ambulate up and down number of stairs: 4

## 2021-05-04 NOTE — Progress Notes (Signed)
Inpatient Rehabilitation  Patient information reviewed and entered into eRehab system by Adesuwa Osgood Verlee Pope, OTR/L.   Information including medical coding, functional ability and quality indicators will be reviewed and updated through discharge.    

## 2021-05-04 NOTE — Discharge Instructions (Addendum)
Inpatient Rehab Discharge Instructions  [facility] Discharge date and time: No discharge date for patient encounter.   Activities/Precautions/ Functional Status: Activity: activity as tolerated Diet: regular diet Wound Care: Routine skin checks Functional status:  ___ No restrictions     ___ Walk up steps independently ___ 24/7 supervision/assistance   ___ Walk up steps with assistance ___ Intermittent supervision/assistance  ___ Bathe/dress independently ___ Walk with walker     _x__ Bathe/dress with assistance ___ Walk Independently    ___ Shower independently ___ Walk with assistance    ___ Shower with assistance ___ No alcohol     ___ Return to work/school ________  Special Instructions: No driving smoking or alcohol  Follow-up medical oncology [phone]   COMMUNITY REFERRALS UPON DISCHARGE:    Outpatient: PT , OT SP             Agency: [facility] Phone: [phone]              Appointment Date/Time:CALL PATIENT OR HUSBAND TO SET UP APPOINTMENTS  Medical Equipment/Items [place]                                                 Agency/Supplier: [facility]  [phone]    My questions have been answered and I understand these instructions. I will adhere to these goals and the provided educational materials after my discharge from the hospital.  Patient/Caregiver Signature _______________________________ Date __________  Clinician Signature _______________________________________ Date __________  Please bring this form and your medication list with you to all your follow-up doctor's appointments.

## 2021-05-04 NOTE — IPOC Note (Signed)
Overall Plan of Care [facility]) Patient Details Name: Tara Murphy MRN: [id number] DOB: 1946/02/09  Admitting Diagnosis: Brain tumor [facility])  Hospital Problems: Principal Problem:   Brain tumor [facility])     Functional Problem List: Nursing Endurance, Medication Management, Pain, Safety, Skin Integrity  PT Balance, Behavior, Edema, Endurance, Motor, Pain, Perception, Safety, Sensory, Skin Integrity  OT Balance, Sensory, Skin Integrity, Endurance, Motor, Perception  SLP    TR         Basic ADL's: OT Eating, Grooming, Bathing, Dressing, Toileting     Advanced  ADL's: OT Simple Meal Preparation     Transfers: PT Bed Mobility, Bed to Chair, Car, Manufacturing systems engineer, Metallurgist: PT Ambulation, Emergency planning/management officer, Stairs     Additional Impairments: OT Fuctional Use of Upper Extremity  SLP        TR      Anticipated Outcomes Item Anticipated Outcome  Self Feeding mod I  Swallowing      Basic self-care  mod I  Toileting  S   Bathroom Transfers S  Bowel/Bladder  n/a  Transfers  Supervision  Locomotion  CGA with LRAD  Communication     Cognition     Pain  < 3  Safety/Judgment  min assist and no falls   Therapy Plan: PT Intensity: Minimum of 1-2 x/day ,45 to 90 minutes PT Frequency: 5 out of 7 days PT Duration Estimated Length of Stay: 2 weeks OT Intensity: Minimum of 1-2 x/day, 45 to 90 minutes OT Frequency: 5 out of 7 days OT Duration/Estimated Length of Stay: 12 to 14 days     Due to the current state of emergency, patients may not be receiving their 3-hours of Medicare-mandated therapy.   Team Interventions: Nursing Interventions Patient/Family Education, Disease Management/Prevention, Pain Management, Medication Management, Skin Care/Wound Management, Discharge Planning, Psychosocial Support  PT interventions Ambulation/gait training, Discharge planning, Functional mobility training, Psychosocial support, Therapeutic Activities,  Visual/perceptual remediation/compensation, Balance/vestibular training, Disease management/prevention, Neuromuscular re-education, Skin care/wound management, Therapeutic Exercise, Wheelchair propulsion/positioning, Cognitive remediation/compensation, DME/adaptive equipment instruction, Pain management, Splinting/orthotics, UE/LE Strength taining/ROM, Community reintegration, Barrister's clerk education, Technical sales engineer stimulation, IT trainer, UE/LE Coordination activities  OT Interventions Training and development officer, Disease mangement/prevention, Neuromuscular re-education, Self Care/advanced ADL retraining, Therapeutic Exercise, Wheelchair propulsion/positioning, UE/LE Strength taining/ROM, Skin care/wound managment, Pain management, DME/adaptive equipment instruction, Cognitive remediation/compensation, Community reintegration, Functional electrical stimulation, Patient/family education, Splinting/orthotics, UE/LE Coordination activities, Therapeutic Activities, Psychosocial support, Functional mobility training, Discharge planning, Visual/perceptual remediation/compensation  SLP Interventions    TR Interventions    SW/CM Interventions Discharge Planning, Psychosocial Support, Patient/Family Education   Barriers to Discharge MD  Medical stability  Nursing Decreased caregiver support, Home environment access/layout, Wound Care, Lack of/limited family support, Weight, Medication compliance, Pending chemo/radiation New diagnosis of cancer. Lives with spouse in 1 level home with 1 step to enter and no rail. Daughter will be main contact, she is POA, and spouse is very [place]. Daughter and spouse will be caregivers and able to provide 24/7 care. Spouse is stepfather to daughter of 3 yrs and there is some tension.  PT Decreased caregiver support, Lack of/limited family support Significant strength deficits in LLE with husband possible unable to provide physical assist  OT      SLP      SW  Decreased caregiver support husband has back issues   Team Discharge Planning: Destination: PT-Home ,OT- Home , SLP-  Projected Follow-up: PT-None, OT-  Outpatient OT, SLP-  Projected Equipment Needs: PT-To be determined, OT-  To be determined, SLP-  Equipment Details: PT- , OT-  Patient/family involved in discharge planning: PT- Patient,  OT-Patient, SLP-   MD ELOS: 10-14 days modI Medical Rehab Prognosis:  Excellent Assessment: Tara Murphy is a 75 year old woman admitted to CIR with decreased functional ability with question seizure secondary to hemorrhagic lesion in the paramedian superior right frontal lobe measuring 2.6 x 1.6 cm with vasogenic edema.  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  Pathology reports pending. Medications are being managed, and labs and vitals are being monitored regularly.     See Team Conference Notes for weekly updates to the plan of care

## 2021-05-04 NOTE — Evaluation (Signed)
Occupational Therapy Assessment and Plan  Patient Details  Name: Tara Murphy MRN: [id number] Date of Birth: March 05, 1946  OT Diagnosis: hemiplegia affecting non-dominant side, muscle weakness (generalized), and decreased dynamic standing balance, decreased activi ty tolerance, decreased L [facility] Rehab Potential: Rehab Potential (ACUTE ONLY): Good ELOS: 12 to 14 days   Today's Date: 05/04/2021 OT Individual Time: 0800-0906 OT Individual Time Calculation (min): 66 min     Hospital Problem: Principal Problem:   Brain tumor [facility])   Past Medical History:  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[place])    S/p total hip replacement left   Breast cancer ([place]) 2015   left    Cataract    Cholecystolithiasis    Chronic allergic rhinitis    Depression    Former smoker, stopped smoking in distant past 10/05/2019   Quit 2000; 26 y smoking history   GERD (gastroesophageal reflux disease)    Hepatic steatosis 06/08/2020   Intermittent elevated LFTs and ultrasound 2018   Hyperlipemia    IBS (irritable bowel syndrome) 10/08/2018   Nl colonoscopy and EGD 09/2018   IBS (irritable bowel syndrome)    Lichen plano-pilaris    Major depression, 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 Repeat 2020   Urinary tract infection    hx of frequent    Past Surgical History:  Past Surgical History:  Procedure Laterality Date   APPENDECTOMY     APPLICATION OF CRANIAL NAVIGATION Right 04/30/2021   Procedure: APPLICATION OF CRANIAL NAVIGATION;  Surgeon: Earnie Larsson, MD;  Location: [place];  Service: Neurosurgery;  Laterality: Right;   BREAST LUMPECTOMY Left 2015   cataracts      CHOLECYSTECTOMY N/A 10/29/2016   Procedure: LAPAROSCOPIC CHOLECYSTECTOMY;  Surgeon: Erroll Luna, MD;  Location: [place];  Service: General;  Laterality: N/A;   COLONOSCOPY     CRANIOTOMY Right 04/30/2021   Procedure: Right Parietal Craniotomy for tumor with Brain Lab;  Surgeon: Earnie Larsson, MD;  Location: [place];  Service: Neurosurgery;  Laterality: Right;   LIPOSUCTION     TOTAL HIP ARTHROPLASTY     bilateral hip arthoplasty   TOTAL HIP ARTHROPLASTY Right 12/18/2015   Procedure: RIGHT TOTAL HIP ARTHROPLASTY ANTERIOR APPROACH;  Surgeon: Gaynelle Arabian, MD;  Location: [facility];  Service: Orthopedics;  Laterality: Right;   TOTAL KNEE ARTHROPLASTY     TOTAL KNEE ARTHROPLASTY Left 11/25/2016   Procedure: LEFT TOTAL KNEE ARTHROPLASTY;  Surgeon: Gaynelle Arabian, MD;  Location: [facility];  Service: Orthopedics;  Laterality: Left;  with abductor block   TUBAL LIGATION     UPPER GASTROINTESTINAL ENDOSCOPY      Assessment & Plan Clinical Impression: Patient is a 75 y.o. 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  Per chart review lives with spouse.  1 level home one-step to entry.  Ambulates household distances with the use of a Rollator and has had  a history of falls that began in February but was independent prior.  Presented 04/30/2021 after seizure-like activity left side jerking movements.  CT/MRI of the head showed a hemorrhagic lesion of the paramedian superior right frontal lobe measuring 2.6 x 1.6 cm.  There is moderate surrounding vasogenic edema.  The midline structures were normal.  Findings most consistent with solitary metastasis.  CT of the chest showed a large spiculated left upper lobe suprahilar mass  consistent with primary bronchogenic malignancy.  EEG was negative for seizure.  She had been loaded with Keppra and remains on 1000 mg twice daily.  Patient underwent bilateral parietal craniotomy with resection of brain tumor microdissection intraoperative stereotactic navigation for volumetric resection 04/30/2021 per Dr. Annette Stable.  Decadron protocol as indicated.  Pathology report remains pending and neurosurgery plans to discuss with radiation oncology in regards to postoperative SRS treatment.  Tolerating a regular consistency diet.  Therapy evaluations completed due to patient decreased functional mobility was admitted for a comprehensive rehab program. She is very motivated for CIR!   Patient transferred to CIR on 05/03/2021 .    Patient currently requires min with basic self-care skills secondary to muscle weakness, impaired timing and sequencing, unbalanced muscle activation, decreased coordination, and decreased motor planning, and decreased standing balance, decreased postural control, hemiplegia, and decreased balance strategies.  Prior to hospitalization/present ilnnes patient could complete BADL with modified independent .  Patient will benefit from skilled intervention to decrease level of assist with basic self-care skills, increase independence with basic self-care skills, and increase level of independence with iADL prior to discharge home with care partner.  Anticipate patient will require intermittent supervision and follow up outpatient.  OT - End of Session Activity Tolerance: Tolerates 10 - 20 min activity with multiple rests Endurance Deficit: Yes Endurance Deficit Description: Req increased time and breaks to complete ADL OT Assessment Rehab Potential (ACUTE ONLY): Good OT Patient demonstrates impairments in the following area(s): Balance;Sensory;Skin Integrity;Endurance;Motor;Perception OT Basic ADL's Functional Problem(s): Eating;Grooming;Bathing;Dressing;Toileting OT Advanced  ADL's Functional Problem(s): Simple Meal Preparation OT Transfers Functional Problem(s): Toilet;Tub/Shower OT Additional Impairment(s): Fuctional Use of Upper Extremity OT Plan OT Intensity: Minimum of 1-2 x/day, 45 to 90 minutes OT Frequency: 5 out of 7 days OT Duration/Estimated Length of Stay: 12 to 14 days OT Treatment/Interventions: Balance/vestibular training;Disease mangement/prevention;Neuromuscular re-education;Self Care/advanced ADL retraining;Therapeutic Exercise;Wheelchair propulsion/positioning;UE/LE Strength taining/ROM;Skin care/wound managment;Pain management;DME/adaptive equipment instruction;Cognitive remediation/compensation;Community reintegration;Functional electrical stimulation;Patient/family education;Splinting/orthotics;UE/LE Coordination activities;Therapeutic Activities;Psychosocial support;Functional mobility training;Discharge planning;Visual/perceptual remediation/compensation OT Self Feeding Anticipated Outcome(s): mod I OT Basic Self-Care Anticipated Outcome(s): mod I OT Toileting Anticipated Outcome(s): S OT Bathroom Transfers Anticipated Outcome(s): S OT Recommendation Recommendations for Other Services: Therapeutic Recreation consult Therapeutic Recreation Interventions: Pet therapy;Kitchen group;Stress management;Outing/community reintergration Patient destination: Home Follow Up Recommendations: Outpatient OT Equipment Recommended: To be determined   OT Evaluation Precautions/Restrictions  Precautions Precautions: Fall Precaution Comments: seizure, L foot drop Required Braces or Orthoses: Other Brace Other Brace: L AFO Restrictions Weight Bearing Restrictions: No General Chart Reviewed: Yes Family/Caregiver Present: No  Pain Pain Assessment Pain Scale: Faces Pain Score: 0-No pain Faces Pain Scale: No hurt Pain Type: Acute pain Pain Location: Head Pain Orientation: Posterior Pain Descriptors / Indicators: Aching;Headache Patients Stated  Pain Goal: 2 Pain Intervention(s): Medication (See eMAR);Rest Home Living/Prior Functioning Home Living Family/patient expects to be discharged to:: Private residence Living Arrangements: Spouse/significant other Available Help at Discharge: Family, Available 24 hours/day Type of Home: House Home Access: Stairs to enter [employer] of Steps: 1 Entrance Stairs-Rails: None Home Layout:  One level Bathroom Shower/Tub: Multimedia programmer: Standard Bathroom Accessibility: Yes Additional Comments: Husband unable provide any heavy lifting most likely.  Lives With: Spouse IADL History Homemaking Responsibilities: Yes Meal Prep Responsibility: Secondary Laundry Responsibility: Secondary Cleaning Responsibility: Secondary Shopping Responsibility: Secondary Child Care Responsibility: No Occupation: Retired Tax adviser: grandchildren, plants, friends/family Prior Function Level of Independence: Requires assistive device for independence, Independent with basic ADLs, Independent with transfers, Independent with gait, Other (comment) (prior to current illness) Vision Baseline Vision/History: No visual deficits Patient Visual Report: No change from baseline Vision Assessment?: No apparent visual deficits Perception  Perception: Within Functional Limits Praxis Praxis: Intact Cognition Overall Cognitive Status: Within Functional Limits for tasks assessed Arousal/Alertness: Awake/alert Orientation Level: Person;Place;Situation Person: Oriented Place: Oriented Situation: Oriented Year: 2022 Month: August Day of Week: Correct Memory: Appears intact Immediate Memory Recall: Sock;Bed;Blue Memory Recall Sock: Without Cue Memory Recall Blue: Without Cue Memory Recall Bed: Without Cue Attention: Sustained Sustained Attention: Appears intact Safety/Judgment: Appears intact Sensation Sensation Light Touch: Impaired by gross assessment (neuropathy in BLE at  baseline) Hot/Cold: Appears Intact Proprioception: Impaired by gross assessment Proprioception Impaired Details: Impaired LUE;Impaired LLE Stereognosis: Impaired Detail Stereognosis Impaired Details: Impaired LUE Coordination Gross Motor Movements are Fluid and Coordinated: No (LLE>LUE hemi + L foot drop) Fine Motor Movements are Fluid and Coordinated: No Coordination and Movement Description: LLE>LUE hemi + L foot drop Finger Nose Finger Test: moderate dysmetria on L Motor  Motor Motor: Hemiplegia Motor - Skilled Clinical Observations: LLE > LUE hemi, L foot drop  Trunk/Postural Assessment  Cervical Assessment Cervical Assessment: Within Functional Limits Thoracic Assessment Thoracic Assessment: Exceptions to [facility] (rounded shoulders) Lumbar Assessment Lumbar Assessment: Exceptions to [facility] (post pelvic tilt in sitting) Postural Control Postural Control: Deficits on evaluation (decreased with dynamic tasks)  Balance Balance Balance Assessed: Yes Static Sitting Balance Static Sitting - Balance Support: Feet supported Static Sitting - Level of Assistance: 6: Modified independent (Device/Increase time) Dynamic Sitting Balance Dynamic Sitting - Balance Support: Feet supported;No upper extremity supported Dynamic Sitting - Level of Assistance: 5: Stand by assistance Dynamic Sitting Balance - Compensations: S Static Standing Balance Static Standing - Balance Support: Bilateral upper extremity supported;During functional activity Static Standing - Level of Assistance: 5: Stand by assistance Static Standing - Comment/# of Minutes: CGA Dynamic Standing Balance Dynamic Standing - Balance Support: Bilateral upper extremity supported;During functional activity Dynamic Standing - Level of Assistance: 4: Min assist Extremity/Trunk Assessment RUE Assessment RUE Assessment: Within Functional Limits General Strength Comments: 5/5 in shoulder flexion LUE Assessment LUE Assessment: Exceptions  to [facility] Active Range of Motion (AROM) Comments: 3/4 to full shoulder flexion General Strength Comments: mild L hemi, 4/5 in shoulder flexion, dysmetric - reports frequent dropping of items/impaired stereognosis  Care Tool Care Tool Self Care Eating   Eating Assist Level: Set up assist    Oral Care    Oral Care Assist Level: Contact Guard/Toucning assist (standing)    Bathing   Body parts bathed by patient: Face;Right arm;Left arm;Chest;Abdomen;Front perineal area;Right upper leg;Left upper leg;Right lower leg;Left lower leg Body parts bathed by helper: Buttocks   Assist Level: Minimal Assistance - Patient > 75%    Upper Body Dressing(including orthotics)   What is the patient wearing?: Dress   Assist Level: Set up assist    Lower Body Dressing (excluding footwear)   What is the patient wearing?: Underwear/pull up Assist for lower body dressing: Maximal Assistance - Patient 25 - 49%    Putting on/Taking off footwear  What is the patient wearing?: Socks;Shoes;AFO Assist for footwear: Minimal Assistance - Patient > 75%       Care Tool Toileting Toileting activity   Assist for toileting: Moderate Assistance - Patient 50 - 74%     Care Tool Bed Mobility Roll left and right activity  Not assessed      Sit to lying activity    Not assessed      Lying to sitting edge of bed activity  Not assessed         Care Tool Transfers Sit to stand transfer   Sit to stand assist level: Minimal Assistance - Patient > 75%    Chair/bed transfer   Chair/bed transfer assist level: Minimal Assistance - Patient > 75%     Toilet transfer   Assist Level: Minimal Assistance - Patient > 75%     Care Tool Cognition Expression of Ideas and Wants Expression of Ideas and Wants: Without difficulty (complex and basic) - expresses complex messages without difficulty and with speech that is clear and easy to understand   Understanding Verbal and Non-Verbal Content Understanding Verbal and  Non-Verbal Content: Understands (complex and basic) - clear comprehension without cues or repetitions   Memory/Recall Ability *first 3 days only Memory/Recall Ability *first 3 days only: Staff names and faces;Current season;That he or she is in a hospital/hospital unit    Refer to Care Plan for [place] 1 OT Short Term Goal 1 (Week 1): Pt will complete toilet transfer with CGA + LRAD. OT Short Term Goal 2 (Week 1): Pt will complete 3/3 steps of toileting with min A. OT Short Term Goal 3 (Week 1): Pt will be able to ind verbalize at least 2 energy conservation strategies to incorporate into daily routine. OT Short Term Goal 4 (Week 1): Pt will stand >5 min to complete ADL with close S + LRAD.  Recommendations for other services: Therapeutic Recreation  Pet therapy, Kitchen group, Stress management, and Outing/community reintegration   Skilled Therapeutic Intervention ADL ADL Eating: Set up Where Assessed-Eating: Edge of bed Grooming: Contact guard Where Assessed-Grooming: Standing at sink Upper Body Bathing: Supervision/safety Where Assessed-Upper Body Bathing: Shower Lower Body Bathing: Minimal assistance Where Assessed-Lower Body Bathing: Shower Upper Body Dressing: Setup Where Assessed-Upper Body Dressing: Sitting at sink Lower Body Dressing: Maximal assistance Where Assessed-Lower Body Dressing: Standing at sink Toileting: Moderate assistance Where Assessed-Toileting: Bedside Commode Toilet Transfer: Minimal assistance Toilet Transfer Method: Stand pivot Tub/Shower Transfer: Minimal assistance Tub/Shower Transfer Method: Stand pivot Tub/Shower Equipment: Facilities manager: Not assessed Mobility  Bed Mobility Bed Mobility: Not assessed Transfers Sit to Stand: Minimal Assistance - Patient > 75%  Session Note: Pt received seated EOB, no c/o pain and very motivated and agreeable to OT eval. Reviewed role of CIR OT,  evaluation process, ADL/func mobility retraining, goals for therapy, and safety plan. Evaluation completed as documented above with session focus on func transfers, showering, dressing, and grooming. Pt req to shower and is very excited about beginning OT/rehab in general. Donned B shoes / L AFO with min A to tie B shoes and adjust L shoe over heel. Stand-pivot with use of R bed rail and min A to power up and LUE HHA > w/c. Stand-pivot > TTB with RW and light min A to power up and min Vcs for sequencing steps. Pt able to doff brief with mod A to tear off and remove. Care taken to cover head incision  with shower cap and RUE IV with plastic. Pt able to bathe full body , would req min A to thoroughly bathe buttocks as she remain seated on TTB throughout shower. Stand-pivot > RW same manner as before. Able to complete UBD with set-up A. Donned brief with max A to thread LUE and pull over B hips. Stood to brush teeth with CGA and heavy use of UE support on sink. Pt very aware of current deficits and reports goals of being able to take care of her plants, see her grandchildren, return to ind, and get stronger in prep for upcoming radiation for cancer tx.  Pt left seated in w/c with chair alarm engaged, call bell in reach, and all immediate needs met.    Discharge Criteria: Patient will be discharged from OT if patient refuses treatment 3 consecutive times without medical reason, if treatment goals not met, if there is a change in medical status, if patient makes no progress towards goals or if patient is discharged from hospital.  The above assessment, treatment plan, treatment alternatives and goals were discussed and mutually agreed upon: by patient  Volanda Napoleon MS, OTR/L  05/04/2021, 10:59 AM

## 2021-05-05 LAB — GLUCOSE, CAPILLARY
Glucose-Capillary: 117 mg/dL — ABNORMAL HIGH (ref 70–99)
Glucose-Capillary: 111 mg/dL — ABNORMAL HIGH (ref 70–99)
Glucose-Capillary: 95 mg/dL (ref 70–99)
Glucose-Capillary: 120 mg/dL — ABNORMAL HIGH (ref 70–99)

## 2021-05-05 NOTE — Progress Notes (Signed)
Occupational Therapy Session Note  Patient Details  Name: Tara Murphy MRN: [id number] Date of Birth: [birth year]  Today's Date: 05/05/2021 OT Individual Time: 0131-4388 OT Individual Time Calculation (min): 80 min    Short Term Goals: Week 1:  OT Short Term Goal 1 (Week 1): Pt will complete toilet transfer with CGA + LRAD. OT Short Term Goal 2 (Week 1): Pt will complete 3/3 steps of toileting with min A. OT Short Term Goal 3 (Week 1): Pt will be able to ind verbalize at least 2 energy conservation strategies to incorporate into daily routine. OT Short Term Goal 4 (Week 1): Pt will stand >5 min to complete ADL with close S + LRAD.  Skilled Therapeutic Interventions/Progress Updates:    Pt stated, "I am scared to death of falling, but I have to get over that."  Otherwise, she participated in skilled OT to address ADLs and IADLs to increase safety and independence in spite of L foot drop and decreased accuracy of coordination of left UE and digits.  She tolerated the session and participated as follows:  EOB to toilet transfer via rollator (preferred not to don her AFO=CGA to elevated 3:1  Toilet to transfer (3" high threshold) via grab bars, rollator and tub transfer bench placed inside the shower on 5000 = extra time to problem solve and Moderate Assistance to lift left lower extremity across the stressshold.     UB bathing =setup  LB bathing = Patient completed sit to stand with Min A due to decreased balance and proprioceptive feedback as well as strength in L LE and due to left upper extremity coordination and proprioceptive feedback in order to maintain safe balance, utilize bilateral upper extremity skills, and in order to complete pericleaning.    UB dressing=setup LB dressing (except patient elected not to donAFO or shoes in order to put feet up onto bed to rest until next therapy session following)= extra time due fatigue and requesting to move slower and Min A for dynamic  balance for pulling up pants and panties  Grooming=S  Patient was left supine with headed of bed elevated and alarm engaged at the end of the session.  Call bell in place. Continue OT Plan of care  Therapy Documentation Precautions:  Precautions Precautions: Fall Precaution Comments: seizure, LLE weakness/foot drop Required Braces or Orthoses: Other Brace Other Brace: L AFO Restrictions Weight Bearing Restrictions: No  Pain:denied    Therapy/Group: Individual Therapy  Alfredia Ferguson [facility] 05/05/2021, 3:48 PM

## 2021-05-05 NOTE — Progress Notes (Signed)
Occupational Therapy Session Note  Patient Details  Name: Tara Murphy MRN: [id number] Date of Birth: 08/04/1946  Today's Date: 05/05/2021 OT Individual Time: 1300-1310 OT Individual Time Calculation (min): 10 min    Short Term Goals: Week 1:  OT Short Term Goal 1 (Week 1): Pt will complete toilet transfer with CGA + LRAD. OT Short Term Goal 2 (Week 1): Pt will complete 3/3 steps of toileting with min A. OT Short Term Goal 3 (Week 1): Pt will be able to ind verbalize at least 2 energy conservation strategies to incorporate into daily routine. OT Short Term Goal 4 (Week 1): Pt will stand >5 min to complete ADL with close S + LRAD.  Skilled Therapeutic Interventions/Progress Updates: This session patient complained of fatigue and she utilized most of this session's time working over in earlier session and stated that her physical therapy session prior "wore me out."   THis session, she and husband were agreeable to complete education on endurance activities and techniques to increase safety and balance for ADLS and IADLs.  She was left seated as she preferred in her w/c next to her bed visiting with her husband.  Continue OT plan of care     Therapy Documentation Precautions:  Precautions Precautions: Fall Precaution Comments: seizure, LLE weakness/foot drop Required Braces or Orthoses: Other Brace Other Brace: L AFO Restrictions Weight Bearing Restrictions: No General:  Pain:denied     Therapy/Group: Individual Therapy  Alfredia Ferguson [facility] 05/05/2021, 4:50 PM

## 2021-05-05 NOTE — Progress Notes (Signed)
Physical Therapy Session Note  Patient Details  Name: Tara Murphy MRN: [id number] Date of Birth: 1946/02/01  Today's Date: 05/05/2021 PT Individual Time: 1000-1054 and 2423-5361 PT Individual Time Calculation (min): 54 min and 57 min  Short Term Goals: Week 1:  PT Short Term Goal 1 (Week 1): Pt will consistently perform STS CGA PT Short Term Goal 2 (Week 1): Pt will ambulate 57ft MinA PT Short Term Goal 3 (Week 1): Pt will complete supine<>EOB CGA PT Short Term Goal 4 (Week 1): Pt will complete standard height steps MinA  Skilled Therapeutic Interventions/Progress Updates:   Treatment Session 1 Received pt sitting EOB doing her makeup, pt agreeable to PT treatment, and denied any pain during session. Session with emphasis on functional mobility/transfers, generalized strengthening, dynamic standing balance/coordination, gait training, and improved activity tolerance. Donned shoes and L AFO with max A and pt transferred bed<>WC to L stand<>pivot with mod A with max A to position and advance LLE - of note, pt fearful of transferring to L due to weakness. Pt transported to/from room on 5C in [facility] total A for time management purposes. Sit<>stand x 2 with RW and min/mod A with cues for hand placement and anterior weight shifting and pt ambulated 37ft x 2 trials with RW and min A but mod A to advance and position LLE. Pt with tendency to land in L ankle ER but with verbal cues able to correct. Started out guarding L knee in case of buckling but no buckling noted. Pt required frequent rest breaks throughout due to fatigue, but remains very motivated to progress in rehab. Attempted to stand from [facility] without RW, however despite 3 attempts pt unable to come completely upright, therefore transitioned to standing with LUE supported around therapist and guarding L knee. Pt able to stand x5 reps with mod A overall with cues for anterior weight shifting and to push up tall through LEs. Pt reported fatigue afterwards  stating "I think that's it for me right now" but agreed to stay up in [facility] to visit with her husband. Concluded session with pt sitting in WC, needs within reach, and chair pad alarm on.   Treatment Session 2 Received pt sitting in WC, pt agreeable to PT treatment, and denied any pain but reported urge to use bathroom. Session with emphasis on functional mobility/transfers, toileting, generalized strengthening, dynamic standing balance/coordination, gait training, NMR, and improved activity tolerance. L AFO donned throughout session. Pt transferred WC<>bedside commode stand<>pivot with min A using grab bars and able to manage clothing with CGA and void and perform peri-care with supervision. Pt performed WC mobility 130ft using BUE and supervision to [facility] elevators and transported remainder of way to 4W dayroom in [place] total A for time management purposes. Sit<>stand with RW and min A and ambulated 33ft with RW and min A - pt did not require any manual facilitation to advance LLE this afternoon! Worked on mini-squats standing without UE support 2x5 with min/mod A using mirror for visual feedback -emphasis on L lateral weight shifting and hip extensor activation. Transitioned to sit<>stands on [employer] with LUE supported around therapist and mod A x 3 trials using mirror for visual feedback - emphasis on L knee extension and weight shifting to L. Pt performed x 2 additional stand<>pivot transfers throughout session with min A overall and cues for positioning of LLE. Pt transported back to room in [facility] total A and requested to return to bed. Doffed AFO and shoes with max A and transferred sit<>supine  with supervision using BUE to assist LLE into bed. Concluded session with pt supine in bed, needs within reach, and bed alarm on.   Therapy Documentation Precautions:  Precautions Precautions: Fall Precaution Comments: seizure, LLE weakness/foot drop Required Braces or Orthoses: Other Brace Other Brace: L  AFO Restrictions Weight Bearing Restrictions: No   Therapy/Group: Individual Therapy Alfonse Alpers PT, DPT   05/05/2021, 7:28 AM

## 2021-05-06 DIAGNOSIS — R7401 Elevation of levels of liver transaminase levels: Secondary | ICD-10-CM

## 2021-05-06 DIAGNOSIS — M21372 Foot drop, left foot: Secondary | ICD-10-CM

## 2021-05-06 DIAGNOSIS — G8918 Other acute postprocedural pain: Secondary | ICD-10-CM

## 2021-05-06 DIAGNOSIS — D72829 Elevated white blood cell count, unspecified: Secondary | ICD-10-CM

## 2021-05-06 LAB — GLUCOSE, CAPILLARY
Glucose-Capillary: 109 mg/dL — ABNORMAL HIGH (ref 70–99)
Glucose-Capillary: 101 mg/dL — ABNORMAL HIGH (ref 70–99)
Glucose-Capillary: 118 mg/dL — ABNORMAL HIGH (ref 70–99)
Glucose-Capillary: 116 mg/dL — ABNORMAL HIGH (ref 70–99)

## 2021-05-06 NOTE — Progress Notes (Signed)
PROGRESS NOTE   Subjective/Complaints: Patient seen sitting up in bed this morning.  She states she slept well overnight.  Good sitting balance noted.  She states she is working hard with therapies.  She denies complaints.  ROS: Denies CP, SOB, N/V/D  Objective:   No results found. Recent Labs    05/04/21 0543  WBC 10.7*  HGB 12.9  HCT 39.4  PLT 308    Recent Labs    05/04/21 0543  NA 137  K 4.0  CL 101  CO2 25  GLUCOSE 112*  BUN 14  CREATININE 0.52  CALCIUM 9.0     Intake/Output Summary (Last 24 hours) at 05/06/2021 2250 Last data filed at 05/06/2021 1834 Gross per 24 hour  Intake 960 ml  Output --  Net 960 ml         Physical Exam: Vital Signs Blood pressure 131/60, pulse 66, temperature 98.1 F (36.7 C), temperature source Oral, resp. rate 16, height 5\' 6"  (1.676 m), weight 95.9 kg, SpO2 97 %. Constitutional: No distress . Vital signs reviewed. HENT: Normocephalic.  Atraumatic. Eyes: EOMI. No discharge. Cardiovascular: No JVD.  RRR. Respiratory: Normal effort.  No stridor.  Bilateral clear to auscultation. GI: Non-distended.  BS +. Skin: Warm and dry.  Craniotomy site CDI Psych: Normal mood.  Normal behavior. Musc: No edema in extremities.  No tenderness in extremities. Neuro: Alert Follows commands  Assessment/Plan: 1. Functional deficits which require 3+ hours per day of interdisciplinary therapy in a comprehensive inpatient rehab setting. Physiatrist is providing close team supervision and 24 hour management of active medical problems listed below. Physiatrist and rehab team continue to assess barriers to discharge/monitor patient progress toward functional and medical goals  Care Tool:  Bathing    Body parts bathed by patient: Face, Right arm, Left arm, Chest, Abdomen, Front perineal area, Right upper leg, Left upper leg, Right lower leg, Left lower leg   Body parts bathed by helper:  Buttocks     Bathing assist Assist Level: Minimal Assistance - Patient > 75%     Upper Body Dressing/Undressing Upper body dressing   What is the patient wearing?: Dress    Upper body assist Assist Level: Set up assist    Lower Body Dressing/Undressing Lower body dressing      What is the patient wearing?: Underwear/pull up     Lower body assist Assist for lower body dressing: Contact Guard/Touching assist     Toileting Toileting    Toileting assist Assist for toileting: Contact Guard/Touching assist     Transfers Chair/bed transfer  Transfers assist     Chair/bed transfer assist level: Moderate Assistance - Patient 50 - 74%     Locomotion Ambulation   Ambulation assist      Assist level: Moderate Assistance - Patient 50 - 74% Assistive device: Walker-rolling Max distance: 47ft   Walk 10 feet activity   Assist     Assist level: Moderate Assistance - Patient - 50 - 74% Assistive device: Walker-rolling   Walk 50 feet activity   Assist Walk 50 feet with 2 turns activity did not occur: Safety/medical concerns         Walk 150  feet activity   Assist Walk 150 feet activity did not occur: Safety/medical concerns         Walk 10 feet on uneven surface  activity   Assist Walk 10 feet on uneven surfaces activity did not occur: Safety/medical concerns         Wheelchair     Assist        Wheelchair assist level: Minimal Assistance - Patient > 75%      Wheelchair 50 feet with 2 turns activity    Assist    Wheelchair 50 feet with 2 turns activity did not occur: Safety/medical concerns       Wheelchair 150 feet activity     Assist  Wheelchair 150 feet activity did not occur: Safety/medical concerns       Blood pressure 131/60, pulse 66, temperature 98.1 F (36.7 C), temperature source Oral, resp. rate 16, height 5\' 6"  (1.676 m), weight 95.9 kg, SpO2 97 %.  Medical Problem List and Plan: 1.  Decreased  functional ability with question seizure secondary to hemorrhagic lesion in the paramedian superior right frontal lobe measuring 2.6 x 1.6 cm with vasogenic edema.  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  Pathology reports pending Continue CIR  2.  Antithrombotics: -DVT/anticoagulation: SCDs Mechanical: Sequential compression devices, below knee Bilateral lower extremities             -antiplatelet therapy: N/A 3. Postoperative pain: Decrease Norco to 1 tab q6H prn.   Control on 8/14 4. Anxiety: Conntinue BuSpar 7.5 mg twice daily as needed ,Ativan 1 mg every 6 hours as needed anxiety             -antipsychotic agents: N/A 5. Neuropsych: This patient is capable of making decisions on her own behalf. 6. Skin/Wound Care: Routine skin checks 7. Fluids/Electrolytes/Nutrition: Routine in and outs with follow-up chemistries 8.  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  Continue Neurontin 600 mg twice daily. Continue use of AFO.  9.  Seizure prophylaxis.  EEG negative.  Keppra 1000 mg twice daily. Continue until tomorrow and then wean 10.  Restless leg syndrome.  Continue Requip 4 mg nightly 11.  GERD.  Protonix 12.  History of left breast cancer 2015.  Follow-up outpatient 13.  Hepatic Steatosis intermittently elevated since 2018.    LFTs elevated, but improved on 8/12 14.  Steroid-induced hyperglycemia.  Hemoglobin A1c 5.5.  SSI 15. Leukocytosis:  WBCs 10.7 on 8/12, labs ordered for tomorrow  Afebrile, no signs of infection.   LOS: 3 days A FACE TO FACE EVALUATION WAS PERFORMED  Tara Murphy Lorie Phenix 05/06/2021, 10:50 PM

## 2021-05-07 ENCOUNTER — Inpatient Hospital Stay: Payer: Medicare Other

## 2021-05-07 DIAGNOSIS — Z885 Allergy status to narcotic agent status: Secondary | ICD-10-CM | POA: Insufficient documentation

## 2021-05-07 DIAGNOSIS — Z170 Estrogen receptor positive status [ER+]: Secondary | ICD-10-CM | POA: Insufficient documentation

## 2021-05-07 DIAGNOSIS — Z87440 Personal history of urinary (tract) infections: Secondary | ICD-10-CM | POA: Insufficient documentation

## 2021-05-07 DIAGNOSIS — Z87891 Personal history of nicotine dependence: Secondary | ICD-10-CM | POA: Insufficient documentation

## 2021-05-07 DIAGNOSIS — Z801 Family history of malignant neoplasm of trachea, bronchus and lung: Secondary | ICD-10-CM | POA: Insufficient documentation

## 2021-05-07 DIAGNOSIS — Z79899 Other long term (current) drug therapy: Secondary | ICD-10-CM | POA: Insufficient documentation

## 2021-05-07 DIAGNOSIS — Z86010 Personal history of colonic polyps: Secondary | ICD-10-CM | POA: Insufficient documentation

## 2021-05-07 DIAGNOSIS — Z888 Allergy status to other drugs, medicaments and biological substances status: Secondary | ICD-10-CM | POA: Insufficient documentation

## 2021-05-07 DIAGNOSIS — M8580 Other specified disorders of bone density and structure, unspecified site: Secondary | ICD-10-CM | POA: Insufficient documentation

## 2021-05-07 DIAGNOSIS — Z7952 Long term (current) use of systemic steroids: Secondary | ICD-10-CM | POA: Insufficient documentation

## 2021-05-07 DIAGNOSIS — Z833 Family history of diabetes mellitus: Secondary | ICD-10-CM | POA: Insufficient documentation

## 2021-05-07 DIAGNOSIS — K5730 Diverticulosis of large intestine without perforation or abscess without bleeding: Secondary | ICD-10-CM | POA: Insufficient documentation

## 2021-05-07 DIAGNOSIS — C7931 Secondary malignant neoplasm of brain: Secondary | ICD-10-CM | POA: Insufficient documentation

## 2021-05-07 DIAGNOSIS — Z881 Allergy status to other antibiotic agents status: Secondary | ICD-10-CM | POA: Insufficient documentation

## 2021-05-07 DIAGNOSIS — Z96643 Presence of artificial hip joint, bilateral: Secondary | ICD-10-CM | POA: Insufficient documentation

## 2021-05-07 DIAGNOSIS — G936 Cerebral edema: Secondary | ICD-10-CM | POA: Insufficient documentation

## 2021-05-07 DIAGNOSIS — Z8261 Family history of arthritis: Secondary | ICD-10-CM | POA: Insufficient documentation

## 2021-05-07 DIAGNOSIS — R569 Unspecified convulsions: Secondary | ICD-10-CM | POA: Insufficient documentation

## 2021-05-07 DIAGNOSIS — C50911 Malignant neoplasm of unspecified site of right female breast: Secondary | ICD-10-CM | POA: Insufficient documentation

## 2021-05-07 DIAGNOSIS — Z800 Family history of malignant neoplasm of digestive organs: Secondary | ICD-10-CM | POA: Insufficient documentation

## 2021-05-07 DIAGNOSIS — Z803 Family history of malignant neoplasm of breast: Secondary | ICD-10-CM | POA: Insufficient documentation

## 2021-05-07 DIAGNOSIS — C7801 Secondary malignant neoplasm of right lung: Secondary | ICD-10-CM | POA: Insufficient documentation

## 2021-05-07 DIAGNOSIS — I517 Cardiomegaly: Secondary | ICD-10-CM | POA: Insufficient documentation

## 2021-05-07 DIAGNOSIS — Z8349 Family history of other endocrine, nutritional and metabolic diseases: Secondary | ICD-10-CM | POA: Insufficient documentation

## 2021-05-07 DIAGNOSIS — C7951 Secondary malignant neoplasm of bone: Secondary | ICD-10-CM | POA: Insufficient documentation

## 2021-05-07 DIAGNOSIS — R5383 Other fatigue: Secondary | ICD-10-CM | POA: Insufficient documentation

## 2021-05-07 DIAGNOSIS — I700 Atherosclerosis of aorta: Secondary | ICD-10-CM | POA: Insufficient documentation

## 2021-05-07 DIAGNOSIS — Z8379 Family history of other diseases of the digestive system: Secondary | ICD-10-CM | POA: Insufficient documentation

## 2021-05-07 DIAGNOSIS — A150 Tuberculosis of lung: Secondary | ICD-10-CM | POA: Insufficient documentation

## 2021-05-07 DIAGNOSIS — Z9049 Acquired absence of other specified parts of digestive tract: Secondary | ICD-10-CM | POA: Insufficient documentation

## 2021-05-07 LAB — GLUCOSE, CAPILLARY
Glucose-Capillary: 115 mg/dL — ABNORMAL HIGH (ref 70–99)
Glucose-Capillary: 98 mg/dL (ref 70–99)
Glucose-Capillary: 194 mg/dL — ABNORMAL HIGH (ref 70–99)
Glucose-Capillary: 106 mg/dL — ABNORMAL HIGH (ref 70–99)

## 2021-05-07 MED ORDER — LEVETIRACETAM 750 MG PO TABS
750.0000 mg | ORAL_TABLET | Freq: Two times a day (BID) | ORAL | Status: DC
  Administered 2021-05-07 – 2021-05-08 (×2): 750 mg via ORAL
  Filled 2021-05-07 (×3): qty 1

## 2021-05-07 MED ORDER — HYDROCODONE-ACETAMINOPHEN 5-325 MG PO TABS: 1.0000 | ORAL_TABLET | Freq: Three times a day (TID) | ORAL | Status: DC | PRN

## 2021-05-07 NOTE — Progress Notes (Signed)
Occupational Therapy Session Note  Patient Details  Name: Tara Murphy MRN: [id number] Date of Birth: 09-25-1945  Today's Date: 05/07/2021 OT Individual Time: 9396-8864 and 8472-0721 OT Individual Time Calculation (min): 53 min and 54 min   Short Term Goals: Week 1:  OT Short Term Goal 1 (Week 1): Pt will complete toilet transfer with CGA + LRAD. OT Short Term Goal 2 (Week 1): Pt will complete 3/3 steps of toileting with min A. OT Short Term Goal 3 (Week 1): Pt will be able to ind verbalize at least 2 energy conservation strategies to incorporate into daily routine. OT Short Term Goal 4 (Week 1): Pt will stand >5 min to complete ADL with close S + LRAD.  Skilled Therapeutic Interventions/Progress Updates:    1) Treatment session with focus on functional mobility, LUE fine and gross motor control, and cognitive challenges as pt reports concerns about possible cognitive challenges.  Pt received semi-reclined in bed reporting having just finished PT session.  Pt reports weakness in L side of face and concerns in relation to brain mass.  Pt with no difficulty with lip closure with eating or drinking but asking for oral exercises.  Pt transitioned to sitting EOB with assistance to advance LLE, however pt utilizing AFO to assist with advancing towards EOB.  Engaged in peg board replication with use of legend key to decipher correct colors.  Pt asking questions about technique to complete task, therapist encouraging client to complete with no further instructions.  Pt chose linear fashion which improved sequencing and allowed for fewer errors.  Discussed functional carryover to familiar, functional ADL and IADL tasks.  Pt continues to report concerns about her cognition and word finding.  Completed MOCA with pt scoring 24/30, noted most concerns with visuospatial and abstract relations.  Client returned to semi-reclined in bed with min assist to advance LLE.  Pt left with all needs in reach.  2)  Treatment session with focus on self-care retraining with bathing/dressing and functional transfers.  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  Discussed noted visuaspatial challenges from previous sessions as well.  Therapist covered IV site in preparation for shower.  Pt gathered clothing prior to shower.  Pt ambulated to toilet with RW with CGA and cues for RW placement during transfer to toilet.  Pt completed step over walk-in shower with min cues for technique and sequencing for increased safety.  Pt completed bathing with lateral leans on bench and setup for items.  Therapist again providing min cues for transfer out of shower for safety.  Pt completed dressing from EOB at sit > stand level with min cues to thread LLE first for increased ease and independence with LB dressing.  Pt remained seated EOB, reporting desire to work on drawing shapes  Therapy Documentation Precautions:  Precautions Precautions: Fall Precaution Comments: seizure, LLE weakness/foot drop Required Braces or Orthoses: Other Brace Other Brace: L AFO Restrictions Weight Bearing Restrictions: No  Pain:  Pt with no c/o pain    Therapy/Group: Individual Therapy  Simonne Come 05/07/2021, 12:26 PM

## 2021-05-07 NOTE — Progress Notes (Signed)
PROGRESS NOTE   Subjective/Complaints: She is in positive spirits She is worried that her husband may be experiencing early cognitive decline and how to broach this subject with him  ROS: Denies CP, SOB, N/V/D, +anxiety  Objective:   No results found. No results for input(s): WBC, HGB, HCT, PLT in the last 72 hours. No results for input(s): NA, K, CL, CO2, GLUCOSE, BUN, CREATININE, CALCIUM in the last 72 hours.  Intake/Output Summary (Last 24 hours) at 05/07/2021 1430 Last data filed at 05/07/2021 0830 Gross per 24 hour  Intake 600 ml  Output --  Net 600 ml        Physical Exam: Vital Signs Blood pressure 126/61, pulse 71, temperature 98 F (36.7 C), temperature source Oral, resp. rate 17, height 5\' 6"  (1.676 m), weight 95.9 kg, SpO2 97 %. Gen: no distress, normal appearing HEENT: oral mucosa pink and moist, NCAT Cardio: Reg rate Chest: normal effort, normal rate of breathing Abd: soft, non-distended Ext: no edema Psych: pleasant, normal affect Skin: Warm and dry.  Craniotomy site CDI Psych: Normal mood.  Normal behavior. Musc: No edema in extremities.  No tenderness in extremities. Neuro: Alert Follows commands  Assessment/Plan: 1. Functional deficits which require 3+ hours per day of interdisciplinary therapy in a comprehensive inpatient rehab setting. Physiatrist is providing close team supervision and 24 hour management of active medical problems listed below. Physiatrist and rehab team continue to assess barriers to discharge/monitor patient progress toward functional and medical goals  Care Tool:  Bathing    Body parts bathed by patient: Face, Right arm, Left arm, Chest, Abdomen, Front perineal area, Right upper leg, Left upper leg, Right lower leg, Left lower leg, Buttocks   Body parts bathed by helper: Buttocks     Bathing assist Assist Level: Supervision/Verbal cueing     Upper Body  Dressing/Undressing Upper body dressing   What is the patient wearing?: Pull over shirt, Bra    Upper body assist Assist Level: Minimal Assistance - Patient > 75%    Lower Body Dressing/Undressing Lower body dressing      What is the patient wearing?: Underwear/pull up, Pants     Lower body assist Assist for lower body dressing: Contact Guard/Touching assist     Toileting Toileting    Toileting assist Assist for toileting: Contact Guard/Touching assist     Transfers Chair/bed transfer  Transfers assist     Chair/bed transfer assist level: Moderate Assistance - Patient 50 - 74%     Locomotion Ambulation   Ambulation assist      Assist level: Moderate Assistance - Patient 50 - 74% Assistive device: Walker-rolling Max distance: 53ft   Walk 10 feet activity   Assist     Assist level: Moderate Assistance - Patient - 50 - 74% Assistive device: Walker-rolling   Walk 50 feet activity   Assist Walk 50 feet with 2 turns activity did not occur: Safety/medical concerns         Walk 150 feet activity   Assist Walk 150 feet activity did not occur: Safety/medical concerns         Walk 10 feet on uneven surface  activity   Assist Walk  10 feet on uneven surfaces activity did not occur: Safety/medical Chief of Staff assist level: Minimal Assistance - Patient > 75%      Wheelchair 50 feet with 2 turns activity    Assist    Wheelchair 50 feet with 2 turns activity did not occur: Safety/medical concerns       Wheelchair 150 feet activity     Assist  Wheelchair 150 feet activity did not occur: Safety/medical concerns       Blood pressure 126/61, pulse 71, temperature 98 F (36.7 C), temperature source Oral, resp. rate 17, height 5\' 6"  (1.676 m), weight 95.9 kg, SpO2 97 %.  Medical Problem List and Plan: 1.  Decreased functional ability with question seizure secondary to  hemorrhagic lesion in the paramedian superior right frontal lobe measuring 2.6 x 1.6 cm with vasogenic edema.  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  Pathology reports pending Continue CIR  2.  Antithrombotics: -DVT/anticoagulation: SCDs Mechanical: Sequential compression devices, below knee Bilateral lower extremities             -antiplatelet therapy: N/A 3. Postoperative pain: Decrease Norco to 1 tab q8H prn.   Control on 8/15 4. Anxiety: Continue BuSpar 7.5 mg twice daily as needed ,Ativan 1 mg every 6 hours as needed anxiety             -antipsychotic agents: N/A 5. Neuropsych: This patient is capable of making decisions on her own behalf. 6. Skin/Wound Care: Routine skin checks 7. Fluids/Electrolytes/Nutrition: Routine in and outs with follow-up chemistries 8.  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  Continue Neurontin 600 mg twice daily. Continue use of AFO.  9.  Seizure prophylaxis.  EEG negative.  Wean to 750mg  BID 10.  Restless leg syndrome.  Continue Requip 4 mg nightly 11.  GERD.  Protonix 12.  History of left breast cancer 2015.  Follow-up outpatient 13.  Hepatic Steatosis intermittently elevated since 2018.    LFTs elevated, but improved on 8/12 14.  Steroid-induced hyperglycemia.  Hemoglobin A1c 5.5.  SSI 15. Leukocytosis:  WBCs 10.7 on 8/12, labs ordered for tomorrow  Afebrile, no signs of infection.   LOS: 4 days A FACE TO FACE EVALUATION WAS PERFORMED  Aracelli Woloszyn P Stasha Naraine 05/07/2021, 2:30 PM

## 2021-05-07 NOTE — Progress Notes (Signed)
Physical Therapy Session Note  Patient Details  Name: Tara Murphy MRN: [id number] Date of Birth: 05-Oct-1945  Today's Date: 05/07/2021 PT Individual Time: 8182-9937 PT Individual Time Calculation (min): 45 min   Short Term Goals: Week 1:  PT Short Term Goal 1 (Week 1): Pt will consistently perform STS CGA PT Short Term Goal 2 (Week 1): Pt will ambulate 40ft MinA PT Short Term Goal 3 (Week 1): Pt will complete supine<>EOB CGA PT Short Term Goal 4 (Week 1): Pt will complete standard height steps MinA  Skilled Therapeutic Interventions/Progress Updates:    Patient seated EOB and reports already working on getting herself up and ready this morning.  Seated EOB donned socks and shoes with min A using her shoe horn from home and A for L LE AFO.  Patient sit to stand to RW with CGA and ambulated with w/c following for safety x 60' x 2 with min A and cues for L hemipelvic protraction, for stride length on both feet and for posture.  Seated rest and adjusted walker height with improved posture.  Patient assisted in w/c to therapy gym for time management.  Patient negotiated 8 (6") steps with rails and min A mod cues for sequence and safety with foot placement.  Patient seated to work on L ankle without brace able to demo hip flex and ankle DF combined voluntary with increased time and slow initiation.  Performed x 8 reps.  Patient assisted in w/c to room and transferred to bed with rail and CGA.  Sit to supine with S using rail and HOB up.  Left with call bell and needs in reach and bed alarm active.  Therapy Documentation Precautions:  Precautions Precautions: Fall Precaution Comments: seizure, LLE weakness/foot drop Required Braces or Orthoses: Other Brace Other Brace: L AFO Restrictions Weight Bearing Restrictions: No  Pain: Pain Assessment Pain Scale: 0-10 Pain Score: 0-No pain    Therapy/Group: Individual Therapy  Reginia Naas Magda Kiel, PT 05/07/2021, 8:27 AM

## 2021-05-07 NOTE — Progress Notes (Signed)
   05/07/21 1119  Clinical Encounter Type  Visited With Patient and family together  Visit Type Initial  Referral From Nurse  Consult/Referral To Chaplain  Spiritual Encounters  Spiritual Needs Literature [facility])  Mrs. Tara Murphy was completed on 04/29/21  Mrs. Tara Murphy nurse called and sked if the chaplain could come to help with her AD. She will be having surgery tomorrow. AD form was filled out and 2 volunteered witnesses and a personal notary were able to notarize the HCPOA. Copies made for Primary and Secondary, original given to [name] and a copy placed in her chart.    Chaplain Trexton Escamilla Morgan-Simpson [phone]

## 2021-05-08 LAB — GLUCOSE, CAPILLARY
Glucose-Capillary: 118 mg/dL — ABNORMAL HIGH (ref 70–99)
Glucose-Capillary: 114 mg/dL — ABNORMAL HIGH (ref 70–99)
Glucose-Capillary: 99 mg/dL (ref 70–99)
Glucose-Capillary: 126 mg/dL — ABNORMAL HIGH (ref 70–99)

## 2021-05-08 MED ORDER — LEVETIRACETAM 500 MG PO TABS
500.0000 mg | ORAL_TABLET | Freq: Two times a day (BID) | ORAL | Status: DC
  Administered 2021-05-08 – 2021-05-09 (×2): 500 mg via ORAL
  Filled 2021-05-08 (×2): qty 1

## 2021-05-08 MED ORDER — HYDROCODONE-ACETAMINOPHEN 5-325 MG PO TABS
1.0000 | ORAL_TABLET | Freq: Two times a day (BID) | ORAL | Status: DC | PRN
Start: 2021-05-08 — End: 2021-05-09

## 2021-05-08 NOTE — Patient Care Conference (Signed)
Inpatient RehabilitationTeam Conference and Plan of Care Update Date: 05/08/2021   Time: 1:35 PM    Patient Name: Tara Murphy      Medical Record Number: [id number]  Date of Birth: 1946/09/03 Sex: Female         Room/Bed: 38C02C/5C02C-01 Payor Info: Payor: MEDICARE / Plan: MEDICARE PART A AND B / Product Type: *No Product type* /    Admit Date/Time:  05/03/2021  6:15 PM  Primary Diagnosis:  Brain tumor [facility])  Hospital Problems: Principal Problem:   Brain tumor ([place]) Active Problems:   Leukocytosis   Transaminitis   Postoperative pain   Left foot drop    Expected Discharge Date: Expected Discharge Date: 05/16/21  Team Members Present: Physician leading conference: Dr. Leeroy Cha Social Worker Present: Ovidio Kin, LCSW Nurse Present: Dorthula Nettles, RN PT Present: Becky Sax, PT OT Present: Simonne Come, OT SLP Present: Charolett Bumpers, SLP PPS Coordinator present : Ileana Ladd, PT     Current Status/Progress Goal Weekly Team Focus  Bowel/Bladder   continent b/b  remain continent      Swallow/Nutrition/ Hydration             ADL's   Supervision bathing with lateral leans, CGA ambulatory transfers with RW, CGA dressing at sit > stand level. Noted some visuospatial concerns and pt reports occasional memory and word finding challenges.  Supervision overall, Mod I sit > stand, grooming, and UB dressing  ADL retraining, functional mobility, L attention and L side NMR, cognition, pt/family education   Mobility   bed mobility supervision, transfers with RW CGA/min A, gait 69ft with RW min A, 8 steps 2 rails min A  supervision/CGA  functional mobility/transfers, generalized strengthening, dynamic standing balance/coordination, gait training, NMR, and endurance.   Communication             Safety/Cognition/ Behavioral Observations            Pain   no reports of pain  < 3  assess pain q 4 hr and prn   Skin   incision to head  no new skin breakdown  assess skin q  shift and prn     Discharge Planning:  Home with husband who has back issues-needs to be at least supervision-CGA level before going home   Team Discussion: Wants to stay as long as possible. Continent B/B, incision to head looks good. Progressing well. Supervision bed mobility, contact guard transfers with RW, walking 80 ft with RW, completed 8 steps. Supervision bathing, contact guard standing balance, contact guard mobility with RW. Scored 24/30 MOCA, lack of awareness in spacing. Supervision/mod I goals. SLP eval tomorrow. Patient on target to meet rehab goals: yes  *See Care Plan and progress notes for long and short-term goals.   Revisions to Treatment Plan:  Monitor po intake.  Teaching Needs: Family education, medication management, skin/wound care, transfer training, gait training, balance training, endurance training, stair training, safety awareness.  Current Barriers to Discharge: Decreased caregiver support, Medical stability, Home enviroment access/layout, Wound care, Weight, and Medication compliance  Possible Resolutions to Barriers: Continue current medications, provide emotional support.     Medical Summary Current Status: Denies pain, diarrhea from her IBS, poor lower extremity motor control, fatigue, obeisty (BMI 34.12), craniotomy staples in place, cognitive impairements, brain tumor  Barriers to Discharge: Medical stability;Wound care;Decreased family/caregiver support  Barriers to Discharge Comments: diarrhea from her IBS, poor lower extremity motor control, fatigue, obeisty (BMI 34.12), craniotomy staples in place, cognitive impairements, brain tumor Possible  Resolutions to [employer]: provide dietary education, continue daily wound care, continue steroids   Continued Need for Acute Rehabilitation Level of Care: The patient requires daily medical management by a physician with specialized training in physical medicine and rehabilitation for the  following reasons: Direction of a multidisciplinary physical rehabilitation program to maximize functional independence : Yes Medical management of patient stability for increased activity during participation in an intensive rehabilitation regime.: Yes Analysis of laboratory values and/or radiology reports with any subsequent need for medication adjustment and/or medical intervention. : Yes   I attest that I was present, lead the team conference, and concur with the assessment and plan of the team.   Cristi Loron 05/08/2021, 5:14 PM

## 2021-05-08 NOTE — Progress Notes (Signed)
PROGRESS NOTE   Subjective/Complaints: Team conference today Continues to be positive, motivated Does complain of fatigue  ROS: Denies CP, SOB, N/V/D, +anxiety, +fatigue  Objective:   No results found. No results for input(s): WBC, HGB, HCT, PLT in the last 72 hours. No results for input(s): NA, K, CL, CO2, GLUCOSE, BUN, CREATININE, CALCIUM in the last 72 hours.  Intake/Output Summary (Last 24 hours) at 05/08/2021 1353 Last data filed at 05/08/2021 0849 Gross per 24 hour  Intake 720 ml  Output --  Net 720 ml        Physical Exam: Vital Signs Blood pressure 128/62, pulse 63, temperature 97.8 F (36.6 C), temperature source Oral, resp. rate 17, height 5\' 6"  (1.676 m), weight 95.9 kg, SpO2 96 %. Gen: no distress, normal appearing HEENT: oral mucosa pink and moist, NCAT Cardio: Reg rate Chest: normal effort, normal rate of breathing Abd: soft, non-distended Ext: no edema Psych: pleasant, normal affect Skin: Warm and dry.  Craniotomy site CDI Psych: Normal mood.  Normal behavior. Musc: No edema in extremities.  No tenderness in extremities. Neuro: Alert Follows commands  Assessment/Plan: 1. Functional deficits which require 3+ hours per day of interdisciplinary therapy in a comprehensive inpatient rehab setting. Physiatrist is providing close team supervision and 24 hour management of active medical problems listed below. Physiatrist and rehab team continue to assess barriers to discharge/monitor patient progress toward functional and medical goals  Care Tool:  Bathing    Body parts bathed by patient: Face, Right arm, Left arm, Chest, Abdomen, Front perineal area, Right upper leg, Left upper leg, Right lower leg, Left lower leg, Buttocks   Body parts bathed by helper: Buttocks     Bathing assist Assist Level: Set up assist     Upper Body Dressing/Undressing Upper body dressing   What is the patient  wearing?: Pull over shirt, Bra    Upper body assist Assist Level: Minimal Assistance - Patient > 75%    Lower Body Dressing/Undressing Lower body dressing      What is the patient wearing?: Underwear/pull up, Pants     Lower body assist Assist for lower body dressing: Supervision/Verbal cueing     Toileting Toileting    Toileting assist Assist for toileting: Contact Guard/Touching assist     Transfers Chair/bed transfer  Transfers assist     Chair/bed transfer assist level: Contact Guard/Touching assist     Locomotion Ambulation   Ambulation assist      Assist level: Contact Guard/Touching assist Assistive device: Walker-rolling Max distance: 38ft   Walk 10 feet activity   Assist     Assist level: Contact Guard/Touching assist Assistive device: Walker-rolling   Walk 50 feet activity   Assist Walk 50 feet with 2 turns activity did not occur: Safety/medical concerns  Assist level: Contact Guard/Touching assist Assistive device: Walker-rolling    Walk 150 feet activity   Assist Walk 150 feet activity did not occur: Safety/medical concerns         Walk 10 feet on uneven surface  activity   Assist Walk 10 feet on uneven surfaces activity did not occur: Safety/medical concerns         Wheelchair  Assist        Wheelchair assist level: Minimal Assistance - Patient > 75%      Wheelchair 50 feet with 2 turns activity    Assist    Wheelchair 50 feet with 2 turns activity did not occur: Safety/medical concerns       Wheelchair 150 feet activity     Assist  Wheelchair 150 feet activity did not occur: Safety/medical concerns       Blood pressure 128/62, pulse 63, temperature 97.8 F (36.6 C), temperature source Oral, resp. rate 17, height 5\' 6"  (1.676 m), weight 95.9 kg, SpO2 96 %.  Medical Problem List and Plan: 1.  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  Pathology reports pending Continue CIR  -Interdisciplinary Team Conference today   2.  Antithrombotics: -DVT/anticoagulation: SCDs Mechanical: Sequential compression devices, below knee Bilateral lower extremities             -antiplatelet therapy: N/A 3. Postoperative pain: Decrease Norco to 1 tab q12H prn.   Control on 8/16 4. Anxiety: Continue BuSpar 7.5 mg twice daily as needed ,Ativan 1 mg every 6 hours as needed anxiety             -antipsychotic agents: N/A 5. Neuropsych: This patient is capable of making decisions on her own behalf. 6. Skin/Wound Care: Routine skin checks. Staples may be removed 8/22. 7. Fluids/Electrolytes/Nutrition: Routine in and outs with follow-up chemistries 8.  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  Continue Neurontin 600 mg twice daily. Continue use of AFO.  9.  Seizure prophylaxis.  EEG negative.  Wean to 500 mg BID 10.  Restless leg syndrome.  Continue Requip 4 mg nightly 11.  GERD.  Protonix 12.  History of left breast cancer 2015.  Follow-up outpatient 13.  Hepatic Steatosis intermittently elevated since 2018.    LFTs elevated, but improved on 8/12 14.  Steroid-induced hyperglycemia.  Hemoglobin A1c 5.5.  SSI 15. Leukocytosis:  WBCs 10.7 on 8/12, labs ordered for tomorrow  Afebrile, no signs of infection.  16. Disposition: HFU scheduled.   LOS: 5 days A FACE TO FACE EVALUATION WAS PERFORMED  Tara Murphy 05/08/2021, 1:53 PM

## 2021-05-08 NOTE — Progress Notes (Signed)
Physical Therapy Session Note  Patient Details  Name: Tara Murphy MRN: [id number] Date of Birth: 02-24-1946  Today's Date: 05/08/2021 PT Individual Time: 1040-1130 PT Individual Time Calculation (min): 50 min   Short Term Goals: Week 1:  PT Short Term Goal 1 (Week 1): Pt will consistently perform STS CGA PT Short Term Goal 2 (Week 1): Pt will ambulate 1ft MinA PT Short Term Goal 3 (Week 1): Pt will complete supine<>EOB CGA PT Short Term Goal 4 (Week 1): Pt will complete standard height steps MinA  Skilled Therapeutic Interventions/Progress Updates:    Patient in wheelchair in room and reports already saw PT today and worked on walking in hallway.  Patient assisted in w/c to therapy gym for time management and energy conservation.  Patient in parallel bars to work on stride length and LE strength.  Forward lunges on BOSU with UE support and min A for L foot placement and cues for technique x 5 each.  Performed forward gait through floor ladder with UE suuport and CGA cues for L foot clearance and demonstration for technique.   Performed forward an backward marching with UE support and cues for L foot coordination/placement and lift.  Patient performed marching x 12' forward and back x 2.  Side stepping with two hands to one rail and min A with cues and demo for foot placement and lifting.  Patient assisted in w/c to near her room.  Sit to stand to RW with CGA and ambulated CGA x 40' with cues for stride length and L foot clearance.  Patient left seated in w/c with seat alarm active and all needs in reach.  Therapy Documentation Precautions:  Precautions Precautions: Fall Precaution Comments: seizure, LLE weakness/foot drop Required Braces or Orthoses: Other Brace Other Brace: L AFO Restrictions Weight Bearing Restrictions: No  Pain: Pain Assessment Pain Scale: 0-10 Pain Score: 0-No pain (just tightness L LE)    Therapy/Group: Individual Therapy  Reginia Naas Buckhorn,  PT 05/08/2021, 11:06 AM

## 2021-05-08 NOTE — Progress Notes (Signed)
Physical Therapy Session Note  Patient Details  Name: Tara Murphy MRN: [id number] Date of Birth: Jul 31, 1946  Today's Date: 05/08/2021 PT Individual Time: 0800-0855 PT Individual Time Calculation (min): 55 min   Short Term Goals: Week 1:  PT Short Term Goal 1 (Week 1): Pt will consistently perform STS CGA PT Short Term Goal 2 (Week 1): Pt will ambulate 53ft MinA PT Short Term Goal 3 (Week 1): Pt will complete supine<>EOB CGA PT Short Term Goal 4 (Week 1): Pt will complete standard height steps MinA  Skilled Therapeutic Interventions/Progress Updates:   Received pt sitting EOB eager to get dressed, pt agreeable to PT treatment, and denied any pain during session. Session with emphasis on functional mobility/transfers, toileting, dressing, generalized strengthening, dynamic standing balance/coordination, gait training, and improved endurance with activity. Sit<>stand with RW and min A from EOB and pt ambulated 34ft into bathroom with RW and CGA without AFO. Pt able to remove underwear standing with CGA and void and perform peri-care with supervision. Donned underwear with supervision and stood from regular height toilet with CGA pulling on grab bar and pt ambulated to sink and stood and washed hands/brushed teeth with supervision. Sat in WC and doffed nightgown and washed upper body and donned bra and shirt with set up assist. Donned pants sitting with supervision in figure four position and stood at sink to pull pants up with CGA. Donned socks, shoes, and AFO with supervision and increased time using shoe horn. Pt then ambulated 92ft x 1 and 25ft x 1 with RW and CGA with increased time. Pt demonstrates improvements in LLE foot clearance but demonstrates downward gaze due to difficulty "feeling where her foot is", mild L ankle ER, decreased cadence, and narrow BOS requring cues for upright posture/gaze and IR to maintain toes pointed straight forward. Concluded session with pt sitting in WC, needs  within reach, and chair pad alarm on.  Therapy Documentation Precautions:  Precautions Precautions: Fall Precaution Comments: seizure, LLE weakness/foot drop Required Braces or Orthoses: Other Brace Other Brace: L AFO Restrictions Weight Bearing Restrictions: No  Therapy/Group: Individual Therapy Alfonse Alpers PT, DPT   05/08/2021, 7:12 AM

## 2021-05-08 NOTE — Progress Notes (Signed)
Occupational Therapy Session Note  Patient Details  Name: Tara Murphy MRN: [id number] Date of Birth: 07-Jul-1946  Today's Date: 05/08/2021 OT Individual Time: 1130-1202 and 1402-1505 OT Individual Time Calculation (min): 32 min and 63 min   Short Term Goals: Week 1:  OT Short Term Goal 1 (Week 1): Pt will complete toilet transfer with CGA + LRAD. OT Short Term Goal 2 (Week 1): Pt will complete 3/3 steps of toileting with min A. OT Short Term Goal 3 (Week 1): Pt will be able to ind verbalize at least 2 energy conservation strategies to incorporate into daily routine. OT Short Term Goal 4 (Week 1): Pt will stand >5 min to complete ADL with close S + LRAD.  Skilled Therapeutic Interventions/Progress Updates:    1) Treatment session with focus on self-care retraining, functional transfers, and dynamic standing balance during self-care tasks of bathing/dressing.  Pt received upright in w/c expressing desire to shower this session.  Pt ambulated to room shower with RW with close supervision and completed step in to shower over threshold with CGA and cues for technique.  Pt completed bathing seated on shower seat with lateral leans to wash buttocks.  Pt required min assist when stepping out of shower due to difficulty advancing LLE over threshold.  Pt completed LB dressing at sit > stand level with CGA when standing to pull pants over hips.  Pt left seated EOB to don bra and shirt with assist from nurse tech.    Pt's husband arrived at end of session.  Educated husband on typical transfer technique in/out of shower as pt shower at home similar to room shower, discussed recommended placement for grab bars in shower.    2) Treatment session with focus on functional mobility and cognition while engaging in novel activity.  Pt received semi-reclined in bed agreeable to therapy session.  Pt donned socks and shoes with L AFO with use of AE and no physical assistance.  Pt ambulated to toilet with RW with CGA.   Pt able to complete all toileting tasks with close supervision.  Pt transported to therapy gym via w/c for energy conservation.  Engaged in novel table top task in standing with focus on weight shifting and reaching while completing "Franki Monte" focusing on making patterns per instructions. Pt requiring min question cues to recall instructions and for problem solving to complete task per instructions.  Pt returned to room and transferred CGA to EOB.  Pt doffed shoes and AFO and returned to semi-reclined in bed with CGA.    Therapy Documentation Precautions:  Precautions Precautions: Fall Precaution Comments: seizure, LLE weakness/foot drop Required Braces or Orthoses: Other Brace Other Brace: L AFO Restrictions Weight Bearing Restrictions: No  Pain: Pain Assessment Pain Score: 0-No pain (just tightness L LE)   Therapy/Group: Individual Therapy  Simonne Come 05/08/2021, 12:14 PM

## 2021-05-09 ENCOUNTER — Ambulatory Visit (HOSPITAL_BASED_OUTPATIENT_CLINIC_OR_DEPARTMENT_OTHER): Payer: Medicare Other | Admitting: Physical Therapy

## 2021-05-09 ENCOUNTER — Telehealth: Payer: Self-pay | Admitting: Radiation Therapy

## 2021-05-09 ENCOUNTER — Other Ambulatory Visit: Payer: Self-pay | Admitting: Radiation Therapy

## 2021-05-09 DIAGNOSIS — C7931 Secondary malignant neoplasm of brain: Secondary | ICD-10-CM

## 2021-05-09 LAB — GLUCOSE, CAPILLARY
Glucose-Capillary: 103 mg/dL — ABNORMAL HIGH (ref 70–99)
Glucose-Capillary: 95 mg/dL (ref 70–99)
Glucose-Capillary: 125 mg/dL — ABNORMAL HIGH (ref 70–99)
Glucose-Capillary: 106 mg/dL — ABNORMAL HIGH (ref 70–99)

## 2021-05-09 MED ORDER — LEVETIRACETAM 250 MG PO TABS
250.0000 mg | ORAL_TABLET | Freq: Two times a day (BID) | ORAL | Status: DC
  Administered 2021-05-09 – 2021-05-10 (×2): 250 mg via ORAL
  Filled 2021-05-09 (×2): qty 1

## 2021-05-09 MED ORDER — HYDROCODONE-ACETAMINOPHEN 5-325 MG PO TABS
1.0000 | ORAL_TABLET | Freq: Every day | ORAL | Status: DC | PRN
Start: 2021-05-09 — End: 2021-05-10

## 2021-05-09 NOTE — Progress Notes (Signed)
Occupational Therapy Session Note  Patient Details  Name: Tara Murphy MRN: [id number] Date of Birth: 02/04/1946  Today's Date: 05/09/2021 OT Group Time: 3220-2542 OT Group Time Calculation (min): 40 min  and Today's Date: 05/09/2021 OT Missed Time: 20 Minutes Missed Time Reason: Patient fatigue   Short Term Goals: Week 2:  OT Short Term Goal 1 (Week 2): STG = LTGs due to remaining LOS  Skilled Therapeutic Interventions/Progress Updates:  Pt participated in group session with a focus on stress mgmt, education on healthy coping strategies, and social interaction.  Session focus on breaking down stressors into "daily hassles," "major life stressors" and "life circumstances" in effort to allow pts to chunk their stressors into groups and rank them on a scale of 1-10. Pt actively sharing stressors and contributing to group conversation. Offered education on factors that protect [name] against stress such as "daily uplifts," "healthy coping strategies" and "protective factors." Encouraged all group members to make an effort to actively recall one event from their day that was a daily uplift in an effort to protect their mindset from stressors. Issued pt handouts on healthy coping strategies to implement into routine. Pt reports fatigue during session requesting to return to room, this OTA transported pt back to room with total A( pt missed 20 mins of group session), where pt completed stand pivot transfer from w/c>EOB with MIN HHA. Pt left supine in bed with bed alarm activated and all needs within reach.   Therapy Documentation Precautions:  Precautions Precautions: Fall Precaution Comments: seizure, LLE weakness/foot drop Required Braces or Orthoses: Other Brace Other Brace: L AFO Restrictions Weight Bearing Restrictions: No General: General OT Amount of Missed Time: 20 Minutes  Pain: Pt reports no pain during session.    Therapy/Group: Group Therapy  [name] 05/09/2021, 4:01  PM

## 2021-05-09 NOTE — Progress Notes (Signed)
Patient ID: Tara Murphy, female   DOB: 1945-11-11, 75 y.o.   MRN: [id number]  Met with pt to update regarding team conference goals of supervision-mod/I level and target discharge date of 8/24. She is very pleased with this and feels she will be ready to go home by them.. She does not want to be a burden to her husband who has back issues. Discussed OP at discharge will await Speech eval today and await recommendations.

## 2021-05-09 NOTE — Progress Notes (Signed)
Occupational Therapy Weekly Progress Note  Patient Details  Name: Tara Murphy MRN: [id number] Date of Birth: 02-12-1946  Beginning of progress report period: May 04, 2021 End of progress report period: May 09, 2021  Today's Date: 05/09/2021 OT Individual Time: 3557-3220 OT Individual Time Calculation (min): 73 min    Patient has met 4 of 4 short term goals.  Pt is making steady progress towards goals.  Pt is able to complete ambulatory transfers with RW with CGA to close supervision.  Pt is completing bathing at seated level with lateral leans supervision/setup.  Pt requires Min A - CGA when navigating threshold in/out of shower.  CGA for dynamic standing balance.  Pt is able to don L shoe with AFO with use of AE.  Pt is demonstrating mild cognitive impairment, speech eval scheduled for 8/17.  Patient continues to demonstrate the following deficits: muscle weakness, impaired timing and sequencing, unbalanced muscle activation, decreased coordination, and decreased motor planning, and decreased standing balance, decreased postural control, hemiplegia, and decreased balance strategies and therefore will continue to benefit from skilled OT intervention to enhance overall performance with BADL and Reduce care partner burden.  Patient progressing toward long term goals..  Continue plan of care.  OT Short Term Goals Week 1:  OT Short Term Goal 1 (Week 1): Pt will complete toilet transfer with CGA + LRAD. OT Short Term Goal 1 - Progress (Week 1): Met OT Short Term Goal 2 (Week 1): Pt will complete 3/3 steps of toileting with min A. OT Short Term Goal 2 - Progress (Week 1): Met OT Short Term Goal 3 (Week 1): Pt will be able to ind verbalize at least 2 energy conservation strategies to incorporate into daily routine. OT Short Term Goal 3 - Progress (Week 1): Met OT Short Term Goal 4 (Week 1): Pt will stand >5 min to complete ADL with close S + LRAD. OT Short Term Goal 4 - Progress (Week 1):  Met Week 2:  OT Short Term Goal 1 (Week 2): STG = LTGs due to remaining LOS  Skilled Therapeutic Interventions/Progress Updates:    Treatment session with focus on self-care retraining, functional mobility, dynamic standing balance, and endurance. Pt received upright in w/c upon arrival, expressing desire to shower.  Pt gathering items from closet from w/c level and up to standing to obtain items further out of reach.  Pt requiring CGA for dynamic standing and reaching outside BOS for items in closet.  Pt then completed room shower transfer from w/c with increased cues and min assist when stepping over threshold when transitioning from seated position.  Pt would benefit from continued practice with transfers and focus on use of RW with transfers.  Pt completed bathing from seated position with lateral leans.  Pt ambulated back to EOB and completed dressing from EOB.  Pt able to don socks, shoes and AFO with use of AE with supervision.  Pt ambulated around room with RW with CGA while organizing room and putting dirty clothes in laundry pack. Therapist educating on hand placement on RW while managing and transporting other items.  Pt left seated EOB with bed alarm on and all needs in reach.  Therapy Documentation Precautions:  Precautions Precautions: Fall Precaution Comments: seizure, LLE weakness/foot drop Required Braces or Orthoses: Other Brace Other Brace: L AFO Restrictions Weight Bearing Restrictions: No General:   Vital Signs: Therapy Vitals Temp: 98 F (36.7 C) Temp Source: Oral Pulse Rate: 65 Resp: 16 BP: 130/65 Patient Position (if appropriate):  Lying Oxygen Therapy SpO2: 97 % O2 Device: Room Air Pain:  Pt with no c/o pain   Therapy/Group: Individual Therapy  Simonne Come 05/09/2021, 7:30 AM

## 2021-05-09 NOTE — Progress Notes (Signed)
PROGRESS NOTE   Subjective/Complaints: Does complain of cognitive deficits. Discussed that the [place] she is on for seizure prophylaxis can cause cognitive deficits- we are weaning off and she should be off in a couple of days.   ROS: Denies CP, SOB, N/V/D, +anxiety, +fatigue, +impaired cognition  Objective:   No results found. No results for input(s): WBC, HGB, HCT, PLT in the last 72 hours. No results for input(s): NA, K, CL, CO2, GLUCOSE, BUN, CREATININE, CALCIUM in the last 72 hours.  Intake/Output Summary (Last 24 hours) at 05/09/2021 1020 Last data filed at 05/09/2021 2878 Gross per 24 hour  Intake 1080 ml  Output --  Net 1080 ml        Physical Exam: Vital Signs Blood pressure 130/65, pulse 65, temperature 98 F (36.7 C), temperature source Oral, resp. rate 16, height 5\' 6"  (1.676 m), weight 95.9 kg, SpO2 97 %. Gen: no distress, normal appearing HEENT: oral mucosa pink and moist, NCAT Cardio: Reg rate Chest: normal effort, normal rate of breathing Abd: soft, non-distended Ext: no edema Psych: pleasant, normal affect Skin: Warm and dry.  Craniotomy site CDI Psych: Normal mood.  Normal behavior. Musc: No edema in extremities.  No tenderness in extremities. Neuro: Alert. High level cognitive deficits.  Follows commands  Assessment/Plan: 1. Functional deficits which require 3+ hours per day of interdisciplinary therapy in a comprehensive inpatient rehab setting. Physiatrist is providing close team supervision and 24 hour management of active medical problems listed below. Physiatrist and rehab team continue to assess barriers to discharge/monitor patient progress toward functional and medical goals  Care Tool:  Bathing    Body parts bathed by patient: Face, Right arm, Left arm, Chest, Abdomen, Front perineal area, Right upper leg, Left upper leg, Right lower leg, Left lower leg, Buttocks   Body parts bathed  by helper: Buttocks     Bathing assist Assist Level: Set up assist     Upper Body Dressing/Undressing Upper body dressing   What is the patient wearing?: Pull over shirt, Bra    Upper body assist Assist Level: Minimal Assistance - Patient > 75%    Lower Body Dressing/Undressing Lower body dressing      What is the patient wearing?: Underwear/pull up, Pants     Lower body assist Assist for lower body dressing: Supervision/Verbal cueing     Toileting Toileting    Toileting assist Assist for toileting: Contact Guard/Touching assist     Transfers Chair/bed transfer  Transfers assist     Chair/bed transfer assist level: Contact Guard/Touching assist     Locomotion Ambulation   Ambulation assist      Assist level: Contact Guard/Touching assist Assistive device: Walker-rolling Max distance: 28ft   Walk 10 feet activity   Assist     Assist level: Contact Guard/Touching assist Assistive device: Walker-rolling   Walk 50 feet activity   Assist Walk 50 feet with 2 turns activity did not occur: Safety/medical concerns  Assist level: Contact Guard/Touching assist Assistive device: Walker-rolling    Walk 150 feet activity   Assist Walk 150 feet activity did not occur: Safety/medical concerns         Walk 10 feet on  uneven surface  activity   Assist Walk 10 feet on uneven surfaces activity did not occur: Safety/medical concerns         Wheelchair     Assist        Wheelchair assist level: Minimal Assistance - Patient > 75%      Wheelchair 50 feet with 2 turns activity    Assist    Wheelchair 50 feet with 2 turns activity did not occur: Safety/medical concerns       Wheelchair 150 feet activity     Assist  Wheelchair 150 feet activity did not occur: Safety/medical concerns       Blood pressure 130/65, pulse 65, temperature 98 F (36.7 C), temperature source Oral, resp. rate 16, height 5\' 6"  (1.676 m), weight  95.9 kg, SpO2 97 %.  Medical Problem List and Plan: 1.  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  Pathology reports pending Continue CIR  2.  Antithrombotics: -DVT/anticoagulation: SCDs Mechanical: Sequential compression devices, below knee Bilateral lower extremities             -antiplatelet therapy: N/A 3. Postoperative pain: Decrease Norco to 1 tab daily prn.   Control on 8/17 4. Anxiety: Continue BuSpar 7.5 mg twice daily as needed ,Ativan 1 mg every 6 hours as needed anxiety             -antipsychotic agents: N/A 5. Neuropsych: This patient is capable of making decisions on her own behalf. 6. Skin/Wound Care: Routine skin checks. Staples may be removed 8/22. Discussed with patient.  7. Fluids/Electrolytes/Nutrition: Routine in and outs with follow-up chemistries 8.  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  Continue Neurontin 600 mg twice daily. Continue use of AFO.  9.  Seizure prophylaxis.  EEG negative.  Wean to 250 mg BID 10.  Restless leg syndrome.  Continue Requip 4 mg nightly 11.  GERD.  Protonix 12.  History of left breast cancer 2015.  Follow-up outpatient 13.  Hepatic Steatosis intermittently elevated since 2018.    LFTs elevated, but improved on 8/12 14.  Steroid-induced hyperglycemia.  Hemoglobin A1c 5.5.  SSI 15. Leukocytosis:  WBCs 10.7 on 8/12 Afebrile, no signs of infection.  16. Disposition: HFU scheduled.   LOS: 6 days A FACE TO FACE EVALUATION WAS PERFORMED  Tara Murphy P Tara Murphy 05/09/2021, 10:20 AM

## 2021-05-09 NOTE — Progress Notes (Signed)
Physical Therapy Session Note  Patient Details  Name: Tara Murphy MRN: [id number] Date of Birth: 07/26/1946  Today's Date: 05/09/2021 PT Individual Time: 1100-1155  PT Individual Time Calculation (min): 55 min   Short Term Goals: Week 1:  PT Short Term Goal 1 (Week 1): Pt will consistently perform STS CGA PT Short Term Goal 2 (Week 1): Pt will ambulate 82ft MinA PT Short Term Goal 3 (Week 1): Pt will complete supine<>EOB CGA PT Short Term Goal 4 (Week 1): Pt will complete standard height steps MinA  Skilled Therapeutic Interventions/Progress Updates:   Received pt siting EOB speaking with one of her physicians on the phone, pt agreeable to PT treatment, and denied any pain during session. Session with emphasis on functional mobility/transfers, generalized strengthening, dynamic standing balance/coordination, gait training, bed mobility, and improved activity tolerance. Pt transferred bed<>WC stand<>pivot without AD and CGA and transported to/from room on 5C in [facility] total A for time management purposes. Sit<>stand with RW and CGA and pt ambulated 126ft with RW and CGA. Pt demonstrates decreased cadence, narrow BOS, decreased bilateral foot clearance with L ankle ER and mild L hip hiking and required cues for upright posture/gaze, L hip IR, and to avoid relying in UE support. Pt requested to work on bed mobility as she reports difficulty rolling and transferred sit<>L sidelying with CGA with reliance on UEs to assist LLE into position. Educated pt on logroll technique specifically bending L knee and rolling onto her back as if her "knees are glues together" rather than dragging LLE along bed. Pt then rolled from supine<>R sidelying using technique and demonstrating greater ease with transition. Pt performed the following exercises with emphasis on glute strengthening: -L sidelying R clamshells 2x12  -bridges 2x8  Pt transferred L sidelying<>sitting EOB with min A and cues for logroll technique and  transferred mat<>WC stand<>pivot without AD and CGA. Pt ambulated 40ft with RW and CGA to bed and concluded session with pt sitting EOB, needs within reach, and bed alarm on.   Therapy Documentation Precautions:  Precautions Precautions: Fall Precaution Comments: seizure, LLE weakness/foot drop Required Braces or Orthoses: Other Brace Other Brace: L AFO Restrictions Weight Bearing Restrictions: No  Therapy/Group: Individual Therapy Alfonse Alpers PT, DPT   05/09/2021, 7:31 AM

## 2021-05-09 NOTE — Evaluation (Signed)
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[place])    S/p total hip replacement left   Breast cancer ([place]) 2015   left    Cataract    Cholecystolithiasis    Chronic allergic rhinitis    Depression    Former smoker, stopped smoking in distant past 10/05/2019   Quit 2000; 75 y smoking history   GERD (gastroesophageal reflux disease)    Hepatic steatosis 06/08/2020   Intermittent elevated LFTs and ultrasound 2018   Hyperlipemia    IBS (irritable bowel syndrome) 10/08/2018   Nl colonoscopy and EGD 09/2018   IBS (irritable bowel syndrome)    Lichen plano-pilaris    Major depression, 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 Repeat 2020   Urinary tract infection    hx of frequent    Past Surgical History:  Past Surgical History:  Procedure Laterality Date   APPENDECTOMY     APPLICATION OF CRANIAL NAVIGATION Right 04/30/2021   Procedure: APPLICATION OF CRANIAL NAVIGATION;  Surgeon: Earnie Larsson, MD;  Location: [place];  Service: Neurosurgery;  Laterality: Right;   BREAST LUMPECTOMY Left 2015   cataracts     CHOLECYSTECTOMY N/A 10/29/2016   Procedure: LAPAROSCOPIC CHOLECYSTECTOMY;   Surgeon: Erroll Luna, MD;  Location: [place];  Service: General;  Laterality: N/A;   COLONOSCOPY     CRANIOTOMY Right 04/30/2021   Procedure: Right Parietal Craniotomy for tumor with Brain Lab;  Surgeon: Earnie Larsson, MD;  Location: [place];  Service: Neurosurgery;  Laterality: Right;   LIPOSUCTION     TOTAL HIP ARTHROPLASTY     bilateral hip arthoplasty   TOTAL HIP ARTHROPLASTY Right 12/18/2015   Procedure: RIGHT TOTAL HIP ARTHROPLASTY ANTERIOR APPROACH;  Surgeon: Gaynelle Arabian, MD;  Location: [facility];  Service: Orthopedics;  Laterality: Right;   TOTAL KNEE ARTHROPLASTY     TOTAL KNEE ARTHROPLASTY Left 11/25/2016   Procedure: LEFT TOTAL KNEE ARTHROPLASTY;  Surgeon: Gaynelle Arabian, MD;  Location: [facility];  Service: Orthopedics;  Laterality: Left;  with abductor block   TUBAL LIGATION     UPPER GASTROINTESTINAL ENDOSCOPY      Assessment / Plan / Recommendation Clinical Impression  Patient is a 75 y.o. 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  Per chart review lives with spouse.  1 level home one-step to entry.  Ambulates household distances with the use of a Rollator and has had a history of falls that began  in February but was independent prior.  Presented 04/30/2021 after seizure-like activity left side jerking movements.  CT/MRI of the head showed a hemorrhagic lesion of the paramedian superior right frontal lobe measuring 2.6 x 1.6 cm.  There is moderate surrounding vasogenic edema.  The midline structures were normal.  Findings most consistent with solitary metastasis.  CT of the chest showed a large spiculated left upper lobe suprahilar mass consistent with primary bronchogenic malignancy.  EEG was  negative for seizure.  She had been loaded with Keppra and remains on 1000 mg twice daily.  Patient underwent bilateral parietal craniotomy with resection of brain tumor microdissection intraoperative stereotactic navigation for volumetric resection 04/30/2021 per Dr. Annette Stable.  Decadron protocol as indicated.  Pathology report remains pending and neurosurgery plans to discuss with radiation oncology in regards to postoperative SRS treatment.  Tolerating a regular consistency diet.  Therapy evaluations completed due to patient decreased functional mobility was admitted for a comprehensive rehab program. She is very motivated for CIR! Patient transferred to CIR on 05/03/2021  Pt presents with mild cognitive deficits in areas of alternating attention, short term recall, complex problem solving/executive function and high level word finding. Since admission to CIR, pt has begun to independently compensate for memory deficits including making lists, writing things down, and utilizing phone for reminders, however continues to report decline from baseline and concern with ability to participate in functional and leisurely activities at discharge. Pt generative naming ability WFL, occasional word finding deficits during complex conversation. Pt demonstrates impairments in alternating attention task especially in presence of auditory and visual distractions. Pt will benefit from skilled ST in CIR to increase safety and independence with functional tasks in home environment.   Skilled Therapeutic Interventions          Pt participating in portions of Cognistat and other non-standardized assessment of cognitive function. Please see above.    SLP Assessment  Patient will need skilled [facility] during CIR admission    Recommendations  Medication Administration: Whole meds with liquid Oral Care Recommendations: Oral care BID Patient destination: Home Follow up Recommendations: Other (comment) (Pt  states she may want OT/HH ST if radiation impacts cog) Equipment Recommended: None recommended by SLP    SLP Frequency 3 to 5 out of 7 days   SLP Duration  SLP Intensity  SLP Treatment/Interventions 8/24  Minumum of 1-2 x/day, 30 to 90 minutes  Cognitive remediation/compensation;Internal/external aids;Therapeutic Activities;Therapeutic Exercise;Patient/family education;Medication managment;Cueing hierarchy    Pain Pain Assessment Pain Scale: 0-10 Pain Score: 0-No pain  Prior Functioning Cognitive/Linguistic Baseline: Within functional limits Type of Home: House  Lives With: Spouse Available Help at Discharge: Family;Available 24 hours/day Vocation: Retired  Programmer, systems Overall Cognitive Status: Within Functional Limits for tasks assessed Arousal/Alertness: Awake/alert Orientation Level: Oriented X4 Attention: Sustained Sustained Attention: Appears intact Memory: Appears intact Awareness: Appears intact Problem Solving: Appears intact Reasoning: Appears intact Sequencing: Appears intact Decision Making: Appears intact Safety/Judgment: Appears intact  Comprehension Auditory Comprehension Overall Auditory Comprehension: Appears within functional limits for tasks assessed Expression Expression Primary Mode of Expression: Verbal Verbal Expression Overall Verbal Expression: Appears within functional limits for tasks assessed Oral Motor Oral Motor/Sensory Function Overall Oral Motor/Sensory Function: Within functional limits  Care Tool Care Tool Cognition Expression of Ideas and Wants Expression of Ideas and Wants: Without difficulty (complex and basic) - expresses complex messages without difficulty and with speech that is clear and easy to understand   Understanding Verbal and Non-Verbal Content Understanding Verbal  and Non-Verbal Content: Understands (complex and basic) - clear comprehension without cues or repetitions   Memory/Recall Ability *first  3 days only Memory/Recall Ability *first 3 days only: Current season;Location of own room;Staff names and faces;That he or she is in a hospital/hospital unit    Short Term Goals: Week 1: SLP Short Term Goal 1 (Week 1): STG = LTG d/t ELOS  Refer to Care Plan for Long Term Goals  Recommendations for other services: None   Discharge Criteria: Patient will be discharged from SLP if patient refuses treatment 3 consecutive times without medical reason, if treatment goals not met, if there is a change in medical status, if patient makes no progress towards goals or if patient is discharged from hospital.  The above assessment, treatment plan, treatment alternatives and goals were discussed and mutually agreed upon: by patient  Dewaine Conger 05/09/2021, 10:33 AM

## 2021-05-09 NOTE — Telephone Encounter (Signed)
Left a detailed message on the patient's cell introducing myself and explaining that Dr. Annette Stable has referred her to be seen by Radiation Oncology for post operative radiation. Based on the recent Epic notes, the expected discharge date from rehab is 8/24. I have set up appointments for her the week of 8/29 and have requested a call back to review these together.   Mont Dutton R.T.(R)(T) Radiation Special Procedures Navigator  [phone]

## 2021-05-10 LAB — GLUCOSE, CAPILLARY
Glucose-Capillary: 110 mg/dL — ABNORMAL HIGH (ref 70–99)
Glucose-Capillary: 102 mg/dL — ABNORMAL HIGH (ref 70–99)
Glucose-Capillary: 106 mg/dL — ABNORMAL HIGH (ref 70–99)
Glucose-Capillary: 142 mg/dL — ABNORMAL HIGH (ref 70–99)

## 2021-05-10 NOTE — Progress Notes (Signed)
Occupational Therapy Session Note  Patient Details  Name: Tara Murphy MRN: [id number] Date of Birth: May 12, 1946  Today's Date: 05/10/2021 OT Group Time: 1100-1200 OT Group Time Calculation (min): 60 min   Short Term Goals: Week 2:  OT Short Term Goal 1 (Week 2): STG = LTGs due to remaining LOS  Skilled Therapeutic Interventions/Progress Updates:  Pt participated in group session with a focus on community re-entry, energy conservation strategies ( ECS) and safe home set- up for DC. Pt reports she wants to be able to go to the supermarket and sit out on her patio. Discussed safe recommendations for navigating various surface terrains as pt currently using RW for functional mobility. Discussed energy conservation strategies in relation to community re-entry such as always having a plan, pacing yourself, being mindful of how they weather might affect energy levels and being open to asking for help from family as needed. Pt reports she is concerned about having to ask for help from her husband as she reports she is used to being independent and struggles with needing to ask for help. Offered emotional support and provided education on importance of establishing a routine with caregivers and the importance of having clear and open communication with caregivers, pt verbalized understanding.   Education also provided on ECS for navigating stores etc such as using motorized cart when necessary, being aware of various obstacles in stores,  and delegating shopping tasks to family members as appropriate. Pt actively participating in group discussions, even giving advice to other group members. Issued pt education handout to increase carryover about ECS and community re-entry. Pt transported back to room by RT.   Therapy Documentation Precautions:  Precautions Precautions: Fall Precaution Comments: seizure, LLE weakness/foot drop Required Braces or Orthoses: Other Brace Other Brace: L  AFO Restrictions Weight Bearing Restrictions: No  Pain: Pt reports no pain during session.    Therapy/Group: Group Therapy  [name] 05/10/2021, 3:35 PM

## 2021-05-10 NOTE — Progress Notes (Signed)
Physical Therapy Weekly Progress Note  Patient Details  Name: Tara Murphy MRN: [id number] Date of Birth: [birth year]  Beginning of progress report period: May 04, 2021 End of progress report period: May 10, 2021  Today's Date: 05/10/2021 PT Individual Time: 7253-6644 PT Individual Time Calculation (min): 55 min   Patient has met 3 of 4 short term goals. Pt demonstrates steady progress towards long term goals. Pt is currently able to transfer sit<>supine with CGA, roll L/R with supervision, and transfer supine<>sit with min A as she continues to be limited by L sided weakness. Pt is able to perform ambulatory transfers with RW and CGA, ambulate >172f with RW and CGA with significantly increased time, and navigate 12 steps with 2 rails min A. Pt continues to be limited by L hemiparesis, generalized weakness and deconditioning, and decrease balance/postural control. Recommended speech consult on 8/17 due to mild cognitive deficits.   Patient continues to demonstrate the following deficits muscle weakness, decreased cardiorespiratoy endurance, impaired timing and sequencing, abnormal tone, unbalanced muscle activation, and decreased coordination, decreased problem solving, and decreased standing balance, decreased postural control, hemiplegia, and decreased balance strategies and therefore will continue to benefit from skilled PT intervention to increase functional independence with mobility.  Patient progressing toward long term goals..  Continue plan of care.  PT Short Term Goals Week 1:  PT Short Term Goal 1 (Week 1): Pt will consistently perform STS CGA PT Short Term Goal 1 - Progress (Week 1): Met PT Short Term Goal 2 (Week 1): Pt will ambulate 564fMinA PT Short Term Goal 2 - Progress (Week 1): Met PT Short Term Goal 3 (Week 1): Pt will complete supine<>EOB CGA PT Short Term Goal 3 - Progress (Week 1): Progressing toward goal PT Short Term Goal 4 (Week 1): Pt will complete standard  height steps MinA PT Short Term Goal 4 - Progress (Week 1): Met Week 2:  PT Short Term Goal 1 (Week 2): STG=LTG due to LOS  Skilled Therapeutic Interventions/Progress Updates:  Ambulation/gait training;Discharge planning;Functional mobility training;Psychosocial support;Therapeutic Activities;Visual/perceptual remediation/compensation;Balance/vestibular training;Disease management/prevention;Neuromuscular re-education;Skin care/wound management;Therapeutic Exercise;Wheelchair propulsion/positioning;Cognitive remediation/compensation;DME/adaptive equipment instruction;Pain management;Splinting/orthotics;UE/LE Strength taining/ROM;Community reintegration;Patient/family education;Functional electrical stimulation;Stair training;UE/LE Coordination activities   Today's Interventions: [facility], pt agreeable to PT treatment, and denied any pain during session. Session with emphasis on functional mobility, generalized strengthening, dynamic standing balance/coordination, gait training, NMR, and improved endurance with activity. Pt [facility] for time management purposes. Pt navigated 12 steps with 2 rails and min A ascending and descending with a step to pattern. Pt required min cues for "up with the good, down with the bad" technique. Pt with mild difficulty placing LLE on step due to poor proprioception. Pt demonstrates good focused and sustained attention therefore challenged pt with working on selective attention while tasked with dynamic standing balance on Aire[employer] at table in dayroom recreating pictures on pegboard for 2 minutes x 1 trial with 100% accuracy. Transitioned to working on alternating/shifting attention having pt recreate more challenging picture on pegboard then quickly switching over to tic-tac-toe game while standing on A[employer] with close supervision for balance. Pt demonstrated increase in errors made when making pattern and but  only 1 error in tic tac toe game but ultimately able to self-recognize and correct mistakes. Pt reported feeling soreness in L glute/quad after standing, demonstrating overall good weight shifting to L side. Pt ambulated 1536fith RW and CGA to recliner. Concluded session with  pt sitting in recliner, needs within reach, and chair pad alarm on.   Therapy Documentation Precautions:  Precautions Precautions: Fall Precaution Comments: seizure, LLE weakness/foot drop Required Braces or Orthoses: Other Brace Other Brace: L AFO Restrictions Weight Bearing Restrictions: No  Therapy/Group: Individual Therapy  Alfonse Alpers PT, DPT  05/10/2021, 7:22 AM

## 2021-05-10 NOTE — Progress Notes (Signed)
Speech Language Pathology Daily Session Note  Patient Details  Name: Tara Murphy MRN: [id number] Date of Birth: May 02, 1946  Today's Date: 05/10/2021 SLP Individual Time: 1445-1530 SLP Individual Time Calculation (min): 45 min  Short Term Goals: Week 1: SLP Short Term Goal 1 (Week 1): STG = LTG d/t ELOS  Skilled Therapeutic Interventions:   Patient seen for skilled ST session focusing on cognitive-linguistic goals. Patient very pleasant and talkative, and had a lot of questions for SLP regarding her intermittent word finding errors, errors in forgetting how to spell a word, etc. She is a Probation officer Nurse, learning disability) by background so this is important to her. She did appear slightly scattered in her thoughts but able to fully explain herself and maintain topics. SLP encouraged patient to work on relaxation, controlled breathing techniques, to journal/keep log of daily activities, challenges, successes, etc during her rehab stay. She knows not to research about her diagnosis but she wonders what she should expect down the road when she starts chemotherapy. SLP discussed potential benefit from neuropsychologist visit to help patient with coping, etc. Patient in agreement with this. Patient continues to benefit from skilled SLP intervention to maximize cognitive-linguistic function prior to discharge.   Pain Pain Assessment Pain Scale: 0-10 Pain Score: 0-No pain  Therapy/Group: Individual Therapy   Sonia Baller, MA, CCC-SLP Speech Therapy

## 2021-05-10 NOTE — Progress Notes (Signed)
Occupational Therapy Session Note  Patient Details  Name: Tara Murphy MRN: [id number] Date of Birth: Dec 06, 1945  Today's Date: 05/10/2021 OT Individual Time: 1300-1415 OT Individual Time Calculation (min): 75 min    Short Term Goals: Week 1:  OT Short Term Goal 1 (Week 1): Pt will complete toilet transfer with CGA + LRAD. OT Short Term Goal 1 - Progress (Week 1): Met OT Short Term Goal 2 (Week 1): Pt will complete 3/3 steps of toileting with min A. OT Short Term Goal 2 - Progress (Week 1): Met OT Short Term Goal 3 (Week 1): Pt will be able to ind verbalize at least 2 energy conservation strategies to incorporate into daily routine. OT Short Term Goal 3 - Progress (Week 1): Met OT Short Term Goal 4 (Week 1): Pt will stand >5 min to complete ADL with close S + LRAD. OT Short Term Goal 4 - Progress (Week 1): Met Week 2:  OT Short Term Goal 1 (Week 2): STG = LTGs due to remaining LOS   Skilled Therapeutic Interventions/Progress Updates:    Pt sitting up at EOB with daughter requesting to shower.  Pt ambulated to bathroom with close supervision needing min cues to stay on task. Toilet transfer with close supervision.  Toileting with setup.  Also needing cues for safe transfer technique to complete threshold stepover to shower using grab bars with CGA.  Pt doffed all clothing and bathed UB/LB seated on shower bench with setup.  After drying off, pt stepped out of shower over threshold with CGA using grab bars and ambulated to EOB using RW with close supervision.  Provided cues for safe hand placement during sit to stand when completing UB dressing.  Pt needing supervision when donning bra due to initially donning with strap twisted.  Pt donned shirt with setup. Pt ambulated to sink and brushed teeth with close supervision.  Ambulated around bed and sat EOB with close supervision using RW. Sit to supine with mod I. Pt reports fatigued after shower.  Discussed at length various energy conservation  strategies. Call bell in reach, bed alarm on on at end of session.    Therapy Documentation Precautions:  Precautions Precautions: Fall Precaution Comments: seizure, LLE weakness/foot drop Required Braces or Orthoses: Other Brace Other Brace: L AFO Restrictions Weight Bearing Restrictions: No   Therapy/Group: Individual Therapy  Ezekiel Slocumb 05/10/2021, 3:09 PM

## 2021-05-10 NOTE — Evaluation (Signed)
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[place])      S/p total hip replacement left   Breast cancer ([place]) 2015    left    Cataract     Cholecystolithiasis     Chronic allergic rhinitis     Depression     Former smoker, stopped smoking in distant past 10/05/2019    Quit 2000; 1 y smoking history   GERD (gastroesophageal reflux disease)     Hepatic steatosis 06/08/2020    Intermittent elevated LFTs and ultrasound 2018   Hyperlipemia     IBS (irritable bowel syndrome) 10/08/2018    Nl colonoscopy and EGD 09/2018   IBS (irritable bowel syndrome)     Lichen plano-pilaris     Major depression, chronic 09/05/2015   Neuropathy, peripheral, idiopathic      uses gabapentin   Osteopenia 09/07/2019    Dexa 08/2019: T = -1.2 at wrist; back and hips excluded (DJD and hardware).    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 Repeat 2020   Urinary tract infection      hx of frequent     Past Surgical History:       Past Surgical History:  Procedure Laterality Date   APPENDECTOMY       APPLICATION OF CRANIAL NAVIGATION Right 04/30/2021    Procedure: APPLICATION OF CRANIAL NAVIGATION;  Surgeon: Earnie Larsson, MD;  Location: [facility];  Service: Neurosurgery;  Laterality: Right;   BREAST LUMPECTOMY Left 2015   cataracts       CHOLECYSTECTOMY N/A 10/29/2016    Procedure: LAPAROSCOPIC CHOLECYSTECTOMY;  Surgeon: Erroll Luna, MD;  Location: [place];  Service: General;  Laterality: N/A;   COLONOSCOPY       CRANIOTOMY Right 04/30/2021    Procedure: Right  Parietal Craniotomy for tumor with Brain Lab;  Surgeon: Earnie Larsson, MD;  Location: [place];  Service: Neurosurgery;  Laterality: Right;   LIPOSUCTION       TOTAL HIP ARTHROPLASTY        bilateral hip arthoplasty   TOTAL HIP ARTHROPLASTY Right 12/18/2015    Procedure: RIGHT TOTAL HIP ARTHROPLASTY ANTERIOR APPROACH;  Surgeon: Gaynelle Arabian, MD;  Location: [facility];  Service: Orthopedics;  Laterality: Right;   TOTAL KNEE ARTHROPLASTY       TOTAL KNEE ARTHROPLASTY Left 11/25/2016    Procedure: LEFT TOTAL KNEE ARTHROPLASTY;  Surgeon: Gaynelle Arabian, MD;  Location: [facility];  Service: Orthopedics;  Laterality: Left;  with abductor block   TUBAL LIGATION       UPPER GASTROINTESTINAL ENDOSCOPY          Assessment & Plan Clinical Impression: Patient is a 75 y.o. 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  Per chart review lives with spouse.  1 level home one-step to entry.  Ambulates household distances with the use of a Rollator and has had a history of falls that began in February but was independent prior.  Presented 04/30/2021 after seizure-like activity left side jerking movements.  CT/MRI of the head showed a hemorrhagic lesion of the paramedian superior right frontal lobe measuring 2.6 x 1.6 cm.  There is moderate surrounding vasogenic edema.  The midline structures were normal.  Findings most consistent with solitary metastasis.  CT of the chest showed a large spiculated left upper lobe suprahilar mass consistent with primary bronchogenic malignancy.  EEG was negative for seizure.  She had been loaded with Keppra and remains on 1000 mg twice daily.  Patient underwent bilateral parietal craniotomy with resection  of brain tumor microdissection intraoperative stereotactic navigation for volumetric resection 04/30/2021 per Dr. Annette Stable.  Decadron protocol as indicated.  Pathology report remains pending and neurosurgery plans to discuss with radiation oncology in regards to postoperative SRS treatment.  Tolerating a regular consistency diet.  Therapy evaluations completed due to patient decreased functional mobility was admitted for a comprehensive rehab program. She is very motivated for CIR!  Patient transferred to CIR on 05/03/2021 .     Pt presents with decreased activity tolerance, decreased functional mobility, decreased balance, decreased coordination, feelings of stress Limiting pt's independence with leisure/community pursuits.   Pt referred by team for participation in group education sessions.  Pt participated in group discussion today with an emphasis on community reintegration, energy conservation techniques, self advocacy and discharge planning.  Plan  No further TR as pt is d/c 8/24  Recommendations for other services: None   Discharge Criteria: Patient will be discharged from TR if patient refuses treatment 3 consecutive times without medical reason.  If treatment goals not met, if there is a change in medical status, if patient makes no progress towards goals or if patient is discharged from hospital.  The above assessment, treatment plan, treatment alternatives and goals were discussed and mutually agreed upon: by patient  [place] 05/10/2021, 12:41 PM

## 2021-05-10 NOTE — Progress Notes (Signed)
PROGRESS NOTE   Subjective/Complaints: No new complaints this morning On commode Bradycardic Discussed that she will be off Keppra tomorrow and she is happy about this  ROS: Denies CP, SOB, N/V, +anxiety, +fatigue, +impaired cognition, +diarrhea  Objective:   No results found. No results for input(s): WBC, HGB, HCT, PLT in the last 72 hours. No results for input(s): NA, K, CL, CO2, GLUCOSE, BUN, CREATININE, CALCIUM in the last 72 hours.  Intake/Output Summary (Last 24 hours) at 05/10/2021 1122 Last data filed at 05/09/2021 1846 Gross per 24 hour  Intake 720 ml  Output --  Net 720 ml        Physical Exam: Vital Signs Blood pressure 116/83, pulse (!) 56, temperature 98 F (36.7 C), temperature source Oral, resp. rate 18, height 5\' 6"  (1.676 m), weight 95.9 kg, SpO2 99 %. Gen: no distress, normal appearing HEENT: oral mucosa pink and moist, NCAT Cardio: Bradycardia Chest: normal effort, normal rate of breathing Abd: soft, non-distended Ext: no edema Psych: pleasant, normal affect Skin: Warm and dry.  Craniotomy site CDI Psych: Normal mood.  Normal behavior. Musc: No edema in extremities.  No tenderness in extremities. Neuro: Alert. High level cognitive deficits.  Follows commands  Assessment/Plan: 1. Functional deficits which require 3+ hours per day of interdisciplinary therapy in a comprehensive inpatient rehab setting. Physiatrist is providing close team supervision and 24 hour management of active medical problems listed below. Physiatrist and rehab team continue to assess barriers to discharge/monitor patient progress toward functional and medical goals  Care Tool:  Bathing    Body parts bathed by patient: Face, Right arm, Left arm, Chest, Abdomen, Front perineal area, Right upper leg, Left upper leg, Right lower leg, Left lower leg, Buttocks   Body parts bathed by helper: Buttocks     Bathing assist  Assist Level: Set up assist     Upper Body Dressing/Undressing Upper body dressing   What is the patient wearing?: Pull over shirt, Bra    Upper body assist Assist Level: Minimal Assistance - Patient > 75%    Lower Body Dressing/Undressing Lower body dressing      What is the patient wearing?: Underwear/pull up, Pants     Lower body assist Assist for lower body dressing: Supervision/Verbal cueing     Toileting Toileting    Toileting assist Assist for toileting: Contact Guard/Touching assist     Transfers Chair/bed transfer  Transfers assist     Chair/bed transfer assist level: Contact Guard/Touching assist     Locomotion Ambulation   Ambulation assist      Assist level: Contact Guard/Touching assist Assistive device: Walker-rolling Max distance: 130ft   Walk 10 feet activity   Assist     Assist level: Contact Guard/Touching assist Assistive device: Walker-rolling   Walk 50 feet activity   Assist Walk 50 feet with 2 turns activity did not occur: Safety/medical concerns  Assist level: Contact Guard/Touching assist Assistive device: Walker-rolling    Walk 150 feet activity   Assist Walk 150 feet activity did not occur: Safety/medical concerns  Assist level: Contact Guard/Touching assist Assistive device: Walker-rolling    Walk 10 feet on uneven surface  activity   Assist  Walk 10 feet on uneven surfaces activity did not occur: Safety/medical concerns         Wheelchair     Assist        Wheelchair assist level: Minimal Assistance - Patient > 75%      Wheelchair 50 feet with 2 turns activity    Assist    Wheelchair 50 feet with 2 turns activity did not occur: Safety/medical concerns       Wheelchair 150 feet activity     Assist  Wheelchair 150 feet activity did not occur: Safety/medical concerns       Blood pressure 116/83, pulse (!) 56, temperature 98 F (36.7 C), temperature source Oral, resp. rate  18, height 5\' 6"  (1.676 m), weight 95.9 kg, SpO2 99 %.  Medical Problem List and Plan: 1.  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  Pathology reports pending Continue CIR  2.  Antithrombotics: -DVT/anticoagulation: SCDs Mechanical: Sequential compression devices, below knee Bilateral lower extremities             -antiplatelet therapy: N/A 3. Postoperative pain: Discontinue Norco 4. Anxiety: Continue BuSpar 7.5 mg twice daily as needed ,Ativan 1 mg every 6 hours as needed anxiety             -antipsychotic agents: N/A 5. Neuropsych: This patient is capable of making decisions on her own behalf. 6. Skin/Wound Care: Routine skin checks. Staples may be removed 8/22. Discussed with patient.  7. Fluids/Electrolytes/Nutrition: Routine in and outs with follow-up chemistries 8.  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  Continue Neurontin 600 mg twice daily. Continue use of AFO. Discussed Qutenza outpatient. Scheduling outpatient Naplate appointment for her.  9.  Seizure prophylaxis.  EEG negative.  Discontinue Keppra, completed course.  10.  Restless leg syndrome.  Continue Requip 4 mg nightly 11.  GERD.  Protonix 12.  History of left breast cancer 2015.  Follow-up outpatient 13.  Hepatic Steatosis intermittently elevated since 2018.    LFTs elevated, but improved on 8/12 14.  Steroid-induced hyperglycemia.  Hemoglobin A1c 5.5.  SSI 15. Leukocytosis:  WBCs 10.7 on 8/12 Afebrile, no signs of infection.  16. Disposition: HFU scheduled.   LOS: 7 days A FACE TO FACE EVALUATION WAS PERFORMED  Mckinnley Smithey P Simranjit Thayer 05/10/2021, 11:22 AM

## 2021-05-11 ENCOUNTER — Encounter (HOSPITAL_COMMUNITY): Payer: Self-pay

## 2021-05-11 LAB — GLUCOSE, CAPILLARY
Glucose-Capillary: 117 mg/dL — ABNORMAL HIGH (ref 70–99)
Glucose-Capillary: 118 mg/dL — ABNORMAL HIGH (ref 70–99)
Glucose-Capillary: 187 mg/dL — ABNORMAL HIGH (ref 70–99)
Glucose-Capillary: 104 mg/dL — ABNORMAL HIGH (ref 70–99)

## 2021-05-11 NOTE — Progress Notes (Signed)
Occupational Therapy Session Note  Patient Details  Name: Sheketa Ende MRN: [id number] Date of Birth: 15-May-1946  Today's Date: 05/11/2021 OT Group Time: 1101-1201 OT Group Time Calculation (min): 60 min   Short Term Goals: Week 2:  OT Short Term Goal 1 (Week 2): STG = LTGs due to remaining LOS  Skilled Therapeutic Interventions/Progress Updates:  Pt participated in group session with a focus on therapeutic activity of Bowling to facilitate magagement of w/c, hand-eye- coordination, UB strength, social interaction, and increasing activity tolerance. Pt completed bowling turns from sitting in w/c with BUEs, pt able to complete w/c mgmt with supervision, good management of remembering to lock brakes. Pt participating in problem solving tasks by recalling score and writing up pts own score on score board after each turn. Pt actively interacting with other group members by  providing encouragement to other group members and communicating with team mates about rules of activity. Pt transported back to room by RT.   Therapy Documentation Precautions:  Precautions Precautions: Fall Precaution Comments: seizure, LLE weakness/foot drop Required Braces or Orthoses: Other Brace Other Brace: L AFO Restrictions Weight Bearing Restrictions: No  Pt reports no pain during session.    Therapy/Group: Group Therapy  [name] 05/11/2021, 3:00 PM

## 2021-05-11 NOTE — Progress Notes (Signed)
Physical Therapy Session Note  Patient Details  Name: Tara Murphy MRN: [id number] Date of Birth: [birth year]  Today's Date: 05/11/2021 PT Individual Time: 2947-6546 PT Individual Time Calculation (min): 57 min   Short Term Goals: Week 1:  PT Short Term Goal 1 (Week 1): Pt will consistently perform STS CGA PT Short Term Goal 1 - Progress (Week 1): Met PT Short Term Goal 2 (Week 1): Pt will ambulate 63f MinA PT Short Term Goal 2 - Progress (Week 1): Met PT Short Term Goal 3 (Week 1): Pt will complete supine<>EOB CGA PT Short Term Goal 3 - Progress (Week 1): Progressing toward goal PT Short Term Goal 4 (Week 1): Pt will complete standard height steps MinA PT Short Term Goal 4 - Progress (Week 1): Met Week 2:  PT Short Term Goal 1 (Week 2): STG=LTG due to LOS  Skilled Therapeutic Interventions/Progress Updates:   Received pt sitting in recliner, pt agreeable to PT treatment, and denied any pain during session but reported mild soreness in R shoulder but declined any pain interventions. Session with emphasis on functional mobility/transfers, generalized strengthening, dynamic standing balance/coordination, gait training, and improved activity tolerance. Sit<>stand with RW and CGA and ambulated 136fwith RW and CGA to WC. [facility] purposes. Sit<>stand with CGA and donned LiteGait harness standing with max A. Pt stepped on/off LiteGait treadmill with min A and worked on gait training for the following time frames using mirror for visual feedback: -forward walking for 4 minutes and 30 seconds at 0.90m45mfor 120f22farting with BUE support fading to no UE support with occasional use of UEs to stabilize herself. Pt demonstrated narrow BOS, generalized unsteadiness with multiple LOB, with tendency to want to grab onto harness straps rather than holding onto arm supports.  -forward walking for 6 minutes at 0.5mph42mr 207ft 29f 1 UE support. Pt  demonstrated improvements in LLE foot clearance.  -forward walking on incline 4 for 3 minutes and 30 seconds at 0.90mph [id number] [id number] support fading to 1 UE support.  Pt required cues for upright posture/gaze, to relax shoulders, and for increased LLE toe clearance with verbal cues to keep foot "quiet". Pt also required multiple rest breaks throughout session due to fatigue. Pt reported urge to void and ambulated to/from bathroom with RW and CGA and able to manage clothing, void, and perform peri-care with supervision. Pt stood at sink and washed hands with supervision and doffed L AFO and shoes sitting EOB with max A for time management purposes and transferred sit<>supine with supervision. Concluded session with pt supine in bed, needs within reach, and bed alarm on.   Therapy Documentation Precautions:  Precautions Precautions: Fall Precaution Comments: seizure, LLE weakness/foot drop Required Braces or Orthoses: Other Brace Other Brace: L AFO Restrictions Weight Bearing Restrictions: No  Therapy/Group: Individual Therapy Gabbi Whetstone M JAlfonse Alpers   05/11/2021, 7:27 AM

## 2021-05-11 NOTE — Progress Notes (Signed)
PROGRESS NOTE   Subjective/Complaints: No new complaints Working with SLP Has schedule family training Her blood pressure and heart rate are a little soft  ROS: Denies CP, SOB, N/V, +anxiety, +fatigue, +impaired cognition, +diarrhea  Objective:   No results found. No results for input(s): WBC, HGB, HCT, PLT in the last 72 hours. No results for input(s): NA, K, CL, CO2, GLUCOSE, BUN, CREATININE, CALCIUM in the last 72 hours.  Intake/Output Summary (Last 24 hours) at 05/11/2021 1221 Last data filed at 05/11/2021 0845 Gross per 24 hour  Intake 720 ml  Output 2 ml  Net 718 ml        Physical Exam: Vital Signs Blood pressure (!) 120/56, pulse (!) 57, temperature 98.1 F (36.7 C), temperature source Oral, resp. rate 17, height 5\' 6"  (1.676 m), weight 95.9 kg, SpO2 96 %. Gen: no distress, normal appearing HEENT: oral mucosa pink and moist, NCAT Cardio: Bradycardia Chest: normal effort, normal rate of breathing Abd: soft, non-distended Ext: no edema Psych: pleasant, normal affect Skin: Warm and dry.  Craniotomy site CDI Psych: Normal mood.  Normal behavior. Musc: No edema in extremities.  No tenderness in extremities. Neuro: Alert. High level cognitive deficits.  Follows commands  Assessment/Plan: 1. Functional deficits which require 3+ hours per day of interdisciplinary therapy in a comprehensive inpatient rehab setting. Physiatrist is providing close team supervision and 24 hour management of active medical problems listed below. Physiatrist and rehab team continue to assess barriers to discharge/monitor patient progress toward functional and medical goals  Care Tool:  Bathing    Body parts bathed by patient: Face, Right arm, Left arm, Chest, Abdomen, Front perineal area, Right upper leg, Left upper leg, Right lower leg, Left lower leg, Buttocks   Body parts bathed by helper: Buttocks     Bathing assist Assist  Level: Set up assist     Upper Body Dressing/Undressing Upper body dressing   What is the patient wearing?: Pull over shirt, Bra    Upper body assist Assist Level: Supervision/Verbal cueing    Lower Body Dressing/Undressing Lower body dressing      What is the patient wearing?: Underwear/pull up, Pants     Lower body assist Assist for lower body dressing: Supervision/Verbal cueing     Toileting Toileting    Toileting assist Assist for toileting: Supervision/Verbal cueing     Transfers Chair/bed transfer  Transfers assist     Chair/bed transfer assist level: Contact Guard/Touching assist     Locomotion Ambulation   Ambulation assist      Assist level: Contact Guard/Touching assist Assistive device: Walker-rolling Max distance: 169ft   Walk 10 feet activity   Assist     Assist level: Contact Guard/Touching assist Assistive device: Walker-rolling   Walk 50 feet activity   Assist Walk 50 feet with 2 turns activity did not occur: Safety/medical concerns  Assist level: Contact Guard/Touching assist Assistive device: Walker-rolling    Walk 150 feet activity   Assist Walk 150 feet activity did not occur: Safety/medical concerns  Assist level: Contact Guard/Touching assist Assistive device: Walker-rolling    Walk 10 feet on uneven surface  activity   Assist Walk 10 feet on uneven  surfaces activity did not occur: Safety/medical Chief of Staff assist level: Minimal Assistance - Patient > 75%      Wheelchair 50 feet with 2 turns activity    Assist    Wheelchair 50 feet with 2 turns activity did not occur: Safety/medical concerns       Wheelchair 150 feet activity     Assist  Wheelchair 150 feet activity did not occur: Safety/medical concerns       Blood pressure (!) 120/56, pulse (!) 57, temperature 98.1 F (36.7 C), temperature source Oral, resp. rate 17, height 5\' 6"   (1.676 m), weight 95.9 kg, SpO2 96 %.  Medical Problem List and Plan: 1.  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  Pathology reports pending Continue CIR  2.  Antithrombotics: -DVT/anticoagulation: SCDs Mechanical: Sequential compression devices, below knee Bilateral lower extremities             -antiplatelet therapy: N/A 3. Postoperative pain: Discontinue Norco 4. Anxiety: Continue BuSpar 7.5 mg twice daily as needed ,Ativan 1 mg every 6 hours as needed anxiety             -antipsychotic agents: N/A 5. Neuropsych: This patient is capable of making decisions on her own behalf. 6. Skin/Wound Care: Routine skin checks. Staples may be removed 8/22. Discussed with patient.  7. Fluids/Electrolytes/Nutrition: Routine in and outs with follow-up chemistries 8.  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  Continue Neurontin 600 mg twice daily. Continue use of AFO. Discussed Qutenza outpatient. Scheduling outpatient [place] appointment for her.  9.  Seizure prophylaxis.  EEG negative.  Discontinue Keppra, completed course.  10.  Restless leg syndrome.  Continue Requip 4 mg nightly 11.  GERD.  Protonix 12.  History of left breast cancer 2015.  Follow-up outpatient 13.  Hepatic Steatosis intermittently elevated since 2018.    LFTs elevated, but improved on 8/12 14.  Steroid-induced hyperglycemia.  Hemoglobin A1c 5.5.  SSI 15. Leukocytosis:  WBCs 10.7 on 8/12 Afebrile, no signs of infection.  16. Bradycardia; monitor BP TID 17. Hypotension: monitor BP TID 16. Disposition: HFU scheduled.   LOS: 8 days A FACE TO FACE EVALUATION WAS PERFORMED  Tara Murphy 05/11/2021, 12:21 PM

## 2021-05-11 NOTE — Progress Notes (Signed)
Speech Language Pathology Daily Session Note  Patient Details  Name: Tara Murphy MRN: [id number] Date of Birth: [birth year]  Today's Date: 05/11/2021 SLP Individual Time: 7322-0254 SLP Individual Time Calculation (min): 45 min  Short Term Goals: Week 1: SLP Short Term Goal 1 (Week 1): STG = LTG d/t ELOS  Skilled Therapeutic Interventions:   Patient seen for skilled ST session focusing on cognitive-linguistic goals. Patient was happy as her husband is bringing her dog for a visit and she was able to get washed up and showered. SLP introduced various language tasks (word scrambles, word generation, etc) and a couple logic puzzles. Patient was able to perform all language tasks with minimal difficulty but her reporting they were challenging. She required initially min-mod cues to organize and manage information when completing logic puzzle however this improved to supervision to minimal assistance. Patient continues to benefit from skilled SLP intervention to maximize cognitive-linguistic function prior to discharge.  Pain Pain Assessment Pain Scale: 0-10 Pain Score: 0-No pain  Therapy/Group: Individual Therapy  Sonia Baller, MA, CCC-SLP Speech Therapy

## 2021-05-11 NOTE — Progress Notes (Signed)
Occupational Therapy Session Note  Patient Details  Name: Tara Murphy MRN: [id number] Date of Birth: [birth year]  Today's Date: 05/12/2021 OT Individual Time: 1335-1418 OT Individual Time Calculation (min): 43 min   Short Term Goals: Week 1:  OT Short Term Goal 1 (Week 1): Pt will complete toilet transfer with CGA + LRAD. OT Short Term Goal 1 - Progress (Week 1): Met OT Short Term Goal 2 (Week 1): Pt will complete 3/3 steps of toileting with min A. OT Short Term Goal 2 - Progress (Week 1): Met OT Short Term Goal 3 (Week 1): Pt will be able to ind verbalize at least 2 energy conservation strategies to incorporate into daily routine. OT Short Term Goal 3 - Progress (Week 1): Met OT Short Term Goal 4 (Week 1): Pt will stand >5 min to complete ADL with close S + LRAD. OT Short Term Goal 4 - Progress (Week 1): Met  Skilled Therapeutic Interventions/Progress Updates:    Pt greeted in the recliner with no c/o pain. She donned socks + shoes (with AFO component) at Mod I level, using LH shoe horn as needed. CGA for stand pivot<w/c using RW. ADL needs were met, so escorted pt to the outdoor patio. Worked on dynamic standing balance while ambulating over uneven concrete. CGA using RW. Pt very mindful of clearing her Lt foot. She was then escorted back to the room, CGA for stand pivot<recliner. Left her with all needs within reach, very appreciative of the opportunity to go outside today.   Therapy Documentation Precautions:  Precautions Precautions: Fall Precaution Comments: seizure, LLE weakness/foot drop Required Braces or Orthoses: Other Brace Other Brace: L AFO Restrictions Weight Bearing Restrictions: No    ADL: ADL Eating: Set up Where Assessed-Eating: Edge of bed Grooming: Contact guard Where Assessed-Grooming: Standing at sink Upper Body Bathing: Supervision/safety Where Assessed-Upper Body Bathing: Shower Lower Body Bathing: Minimal assistance Where Assessed-Lower Body  Bathing: Shower Upper Body Dressing: Setup Where Assessed-Upper Body Dressing: Sitting at sink Lower Body Dressing: Maximal assistance Where Assessed-Lower Body Dressing: Standing at sink Toileting: Moderate assistance Where Assessed-Toileting: Bedside Commode Toilet Transfer: Minimal assistance Toilet Transfer Method: Stand pivot Tub/Shower Transfer: Minimal assistance Tub/Shower Transfer Method: Stand pivot Tub/Shower Equipment: Facilities manager: Not assessed     Therapy/Group: Individual Therapy  Jahquan Klugh A Noya Santarelli 05/12/2021, 3:14 PM

## 2021-05-12 LAB — GLUCOSE, CAPILLARY
Glucose-Capillary: 117 mg/dL — ABNORMAL HIGH (ref 70–99)
Glucose-Capillary: 126 mg/dL — ABNORMAL HIGH (ref 70–99)
Glucose-Capillary: 102 mg/dL — ABNORMAL HIGH (ref 70–99)
Glucose-Capillary: 106 mg/dL — ABNORMAL HIGH (ref 70–99)
Glucose-Capillary: 148 mg/dL — ABNORMAL HIGH (ref 70–99)

## 2021-05-12 NOTE — Progress Notes (Signed)
PROGRESS NOTE   Subjective/Complaints: Had an episode of blurred vision last night it was around 2 AM she had been reading several articles on her phone and looked up at the wall and noted some blurring of a posterior below the clock.  No eye pain no itching in the eyes.  Nursing checked vitals at that time which were stable.  When she awoke this morning she had no symptoms  ROS: Denies CP, SOB, N/V,   Objective:   No results found. No results for input(s): WBC, HGB, HCT, PLT in the last 72 hours. No results for input(s): NA, K, CL, CO2, GLUCOSE, BUN, CREATININE, CALCIUM in the last 72 hours.  Intake/Output Summary (Last 24 hours) at 05/12/2021 0731 Last data filed at 05/12/2021 0720 Gross per 24 hour  Intake 1680 ml  Output 4 ml  Net 1676 ml         Physical Exam: Vital Signs Blood pressure 138/69, pulse 65, temperature 98.3 F (36.8 C), temperature source Oral, resp. rate 14, height 5\' 6"  (1.676 m), weight 95.9 kg, SpO2 97 %. Eyes: Noninjected, no icterus, extraocular muscles intact, minimal nystagmus on lateral gaze for a few beats to the right. General: No acute distress Mood and affect are appropriate Heart: Regular rate and rhythm no rubs murmurs or extra sounds Lungs: Clear to auscultation, breathing unlabored, no rales or wheezes Abdomen: Positive bowel sounds, soft nontender to palpation, nondistended Extremities: No clubbing, cyanosis, or edema Skin: No evidence of breakdown, no evidence of rash Neurologic: Cranial nerves II through XII intact, motor strength is 5/5 in bilateral deltoid, bicep, tricep, grip, hip flexor, knee extensors, ankle dorsiflexor and plantar flexor No diplopia on lateral gaze Neuro:  Eyes without evidence of nystagmus  Tone is normal without evidence of spasticity Cerebellar exam shows no evidence of ataxia on finger nose finger or heel to shin testing     Sensory exam is normal to  pinprick, proprioception and light touch in the upper and lower limbs   Cranial nerves II- Visual fields are intact to confrontation testing, no blurring of vision III- no evidence of ptosis, upward, downward and medial gaze intact IV- no vertical diplopia or head tilt V- no facial numbness or masseter weakness VI- no pupil abduction weakness VII- no facial droop, good lid closure VII- normal auditory acuity    Neuro: Alert. High level cognitive deficits.    Assessment/Plan: 1. Functional deficits which require 3+ hours per day of interdisciplinary therapy in a comprehensive inpatient rehab setting. Physiatrist is providing close team supervision and 24 hour management of active medical problems listed below. Physiatrist and rehab team continue to assess barriers to discharge/monitor patient progress toward functional and medical goals  Care Tool:  Bathing    Body parts bathed by patient: Face, Right arm, Left arm, Chest, Abdomen, Front perineal area, Right upper leg, Left upper leg, Right lower leg, Left lower leg, Buttocks   Body parts bathed by helper: Buttocks     Bathing assist Assist Level: Set up assist     Upper Body Dressing/Undressing Upper body dressing   What is the patient wearing?: Pull over shirt, Bra    Upper body assist  Assist Level: Supervision/Verbal cueing    Lower Body Dressing/Undressing Lower body dressing      What is the patient wearing?: Underwear/pull up, Pants     Lower body assist Assist for lower body dressing: Supervision/Verbal cueing     Toileting Toileting    Toileting assist Assist for toileting: Supervision/Verbal cueing     Transfers Chair/bed transfer  Transfers assist     Chair/bed transfer assist level: Contact Guard/Touching assist     Locomotion Ambulation   Ambulation assist      Assist level: Contact Guard/Touching assist Assistive device: Walker-rolling Max distance: 192ft   Walk 10 feet  activity   Assist     Assist level: Contact Guard/Touching assist Assistive device: Walker-rolling   Walk 50 feet activity   Assist Walk 50 feet with 2 turns activity did not occur: Safety/medical concerns  Assist level: Contact Guard/Touching assist Assistive device: Walker-rolling    Walk 150 feet activity   Assist Walk 150 feet activity did not occur: Safety/medical concerns  Assist level: Contact Guard/Touching assist Assistive device: Walker-rolling    Walk 10 feet on uneven surface  activity   Assist Walk 10 feet on uneven surfaces activity did not occur: Safety/medical concerns         Wheelchair     Assist        Wheelchair assist level: Minimal Assistance - Patient > 75%      Wheelchair 50 feet with 2 turns activity    Assist    Wheelchair 50 feet with 2 turns activity did not occur: Safety/medical concerns       Wheelchair 150 feet activity     Assist  Wheelchair 150 feet activity did not occur: Safety/medical concerns       Blood pressure 138/69, pulse 65, temperature 98.3 F (36.8 C), temperature source Oral, resp. rate 14, height 5\' 6"  (1.676 m), weight 95.9 kg, SpO2 97 %.  Medical Problem List and Plan: 1.  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  Pathology reports pending Continue CIR PT OT speech 2.  Antithrombotics: -DVT/anticoagulation: SCDs Mechanical: Sequential compression devices, below knee Bilateral lower extremities             -antiplatelet therapy: N/A 3. Postoperative pain: Discontinue Norco 4. Anxiety: Continue BuSpar 7.5 mg twice daily as needed ,Ativan 1 mg every 6 hours as needed anxiety             -antipsychotic agents: N/A 5. Neuropsych: This patient is capable of making decisions on her own  behalf. 6. Skin/Wound Care: Routine skin checks. Staples may be removed 8/22. Discussed with patient.  7. Fluids/Electrolytes/Nutrition: Routine in and outs with follow-up chemistries 8.  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  Continue Neurontin 600 mg twice daily. Continue use of AFO. Discussed Qutenza outpatient. Scheduling outpatient [place] appointment for her.  9.  Seizure prophylaxis.  EEG negative.  Discontinue Keppra, completed course.  10.  Restless leg syndrome.  Continue Requip 4 mg nightly 11.  GERD.  Protonix 12.  History of left breast cancer 2015.  Follow-up outpatient 13.  Hepatic Steatosis intermittently elevated since 2018.    LFTs elevated, but improved on 8/12 14.  Steroid-induced hyperglycemia.  Hemoglobin A1c 5.5.  SSI 15. Leukocytosis:  WBCs 10.7 on 8/12 Afebrile, no signs of infection.  16. Bradycardia; monitor  BP TID 17. Hypotension: monitor BP TID 18Disposition: HFU scheduled.  19.  Transient visual blurring likely fatigue with accommodation lag.  No further work-up needed at this time. LOS: 9 days A FACE TO FACE EVALUATION WAS PERFORMED  Charlett Blake 05/12/2021, 7:31 AM

## 2021-05-12 NOTE — Plan of Care (Signed)
  Problem: Consults Goal: RH BRAIN INJURY PATIENT EDUCATION Description: Description: See Patient Education module for eduction specifics Outcome: Progressing Goal: Skin Care Protocol Initiated - if Braden Score 18 or less Description: If consults are not indicated, leave blank or document N/A Outcome: Progressing   Problem: RH SKIN INTEGRITY Goal: RH STG ABLE TO PERFORM INCISION/WOUND CARE W/ASSISTANCE Description: STG Able To Perform Incision/Wound Care With supervision Assistance. Outcome: Progressing   Problem: RH SAFETY Goal: RH STG DECREASED RISK OF FALL WITH ASSISTANCE Description: STG Decreased Risk of Fall With supervision Assistance. Outcome: Progressing   Problem: RH COGNITION-NURSING Goal: RH STG USES MEMORY AIDS/STRATEGIES W/ASSIST TO PROBLEM SOLVE Description: STG Uses Memory Aids/Strategies With cues and reminders to Problem Solve. Outcome: Progressing Goal: RH STG ANTICIPATES NEEDS/CALLS FOR ASSIST W/ASSIST/CUES Description: STG Anticipates Needs/Calls for Assist With Cues and reminders. Outcome: Progressing   Problem: RH PAIN MANAGEMENT Goal: RH STG PAIN MANAGED AT OR BELOW PT'S PAIN GOAL Description: < 3 on a 0-10 pain scale. Outcome: Progressing

## 2021-05-12 NOTE — Progress Notes (Signed)
Occupational Therapy Session Note  Patient Details  Name: Ricardo Kayes MRN: [id number] Date of Birth: [birth year]  Today's Date: 05/13/2021 OT Individual Time: 1345-1445 OT Individual Time Calculation (min): 60 min    Skilled Therapeutic Interventions/Progress Updates:    Pt greeted while sitting EOB, no c/o pain, in the company of her husband. Pt reported showering with nursing this AM. Agreeable to session and motivated to ambulate. After pt donned footwear at Mod I level, she was escorted via w/c to the outdoor patio and also [facility]rium of hospital. Worked on dynamic standing balance for functional carryover during ADL/IADL. She ambulated over uneven terrain outside using RW with close supervision-CGA, vcs for forward gaze and improving Lt foot clearance during dual task (conversing with therapist). Supervision for ambulation while indoors. Pt has multiple hobbies. Strongly encouraged her to return to hobbies and she is already planning to do this, able to explain to OT the modifications that she and her husband will make to crafting areas for continued participation at home (quilting, stained glass, painting, needle work). Discussed importance of meaningful leisure occupations for psychosocial wellbeing. Pt verbalized understanding. At end of session pt transferred to her recliner using RW. Left pt with all needs within reach.   Therapy Documentation Precautions:  Precautions Precautions: Fall Precaution Comments: seizure, LLE weakness/foot drop Required Braces or Orthoses: Other Brace Other Brace: L AFO Restrictions Weight Bearing Restrictions: No  Vital Signs: Therapy Vitals Temp: 98 F (36.7 C) Temp Source: Oral Pulse Rate: 66 BP: (!) 148/75 Patient Position (if appropriate): Sitting Oxygen Therapy SpO2: 98 % O2 Device: Room Air   ADL: ADL Eating: Set up Where Assessed-Eating: Edge of bed Grooming: Contact guard Where Assessed-Grooming: Standing at sink Upper Body Bathing:  Supervision/safety Where Assessed-Upper Body Bathing: Shower Lower Body Bathing: Minimal assistance Where Assessed-Lower Body Bathing: Shower Upper Body Dressing: Setup Where Assessed-Upper Body Dressing: Sitting at sink Lower Body Dressing: Maximal assistance Where Assessed-Lower Body Dressing: Standing at sink Toileting: Moderate assistance Where Assessed-Toileting: Bedside Commode Toilet Transfer: Minimal assistance Toilet Transfer Method: Stand pivot Tub/Shower Transfer: Minimal assistance Tub/Shower Transfer Method: Stand pivot Tub/Shower Equipment: Facilities manager: Not assessed     Therapy/Group: Individual Therapy  Grasiela Jonsson A Jerricka Carvey 05/13/2021, 4:08 PM

## 2021-05-12 NOTE — Progress Notes (Signed)
Patient called complaining of diplopia on her left eye which gradually resolved after a trip to the bathroom. She said she has been awake for an hour and has been reading 7 articles from her phone the whole time. V/S and blood sugar level within normal limits. PERRLA. No slurred speech or facial droop. Denies having any pain. Encouraged to rest.

## 2021-05-13 LAB — GLUCOSE, CAPILLARY
Glucose-Capillary: 121 mg/dL — ABNORMAL HIGH (ref 70–99)
Glucose-Capillary: 106 mg/dL — ABNORMAL HIGH (ref 70–99)
Glucose-Capillary: 107 mg/dL — ABNORMAL HIGH (ref 70–99)
Glucose-Capillary: 110 mg/dL — ABNORMAL HIGH (ref 70–99)

## 2021-05-13 NOTE — Plan of Care (Signed)
  Problem: Consults Goal: RH BRAIN INJURY PATIENT EDUCATION Description: Description: See Patient Education module for eduction specifics Outcome: Progressing Goal: Skin Care Protocol Initiated - if Braden Score 18 or less Description: If consults are not indicated, leave blank or document N/A Outcome: Progressing   Problem: RH SKIN INTEGRITY Goal: RH STG ABLE TO PERFORM INCISION/WOUND CARE W/ASSISTANCE Description: STG Able To Perform Incision/Wound Care With supervision Assistance. Outcome: Progressing   Problem: RH SAFETY Goal: RH STG DECREASED RISK OF FALL WITH ASSISTANCE Description: STG Decreased Risk of Fall With supervision Assistance. Outcome: Progressing   Problem: RH COGNITION-NURSING Goal: RH STG USES MEMORY AIDS/STRATEGIES W/ASSIST TO PROBLEM SOLVE Description: STG Uses Memory Aids/Strategies With cues and reminders to Problem Solve. Outcome: Progressing Goal: RH STG ANTICIPATES NEEDS/CALLS FOR ASSIST W/ASSIST/CUES Description: STG Anticipates Needs/Calls for Assist With Cues and reminders. Outcome: Progressing   Problem: RH PAIN MANAGEMENT Goal: RH STG PAIN MANAGED AT OR BELOW PT'S PAIN GOAL Description: < 3 on a 0-10 pain scale. Outcome: Progressing

## 2021-05-14 DIAGNOSIS — F4320 Adjustment disorder, unspecified: Secondary | ICD-10-CM

## 2021-05-14 DIAGNOSIS — R4189 Other symptoms and signs involving cognitive functions and awareness: Secondary | ICD-10-CM

## 2021-05-14 LAB — GLUCOSE, CAPILLARY
Glucose-Capillary: 106 mg/dL — ABNORMAL HIGH (ref 70–99)
Glucose-Capillary: 110 mg/dL — ABNORMAL HIGH (ref 70–99)
Glucose-Capillary: 160 mg/dL — ABNORMAL HIGH (ref 70–99)
Glucose-Capillary: 105 mg/dL — ABNORMAL HIGH (ref 70–99)

## 2021-05-14 NOTE — Consult Note (Signed)
Neuropsychological Consultation   Patient:   Tara Murphy   DOB:   [birth year]  MR Number:  [id number]  Location:  [facility] [id number] [place] [address] Dept: [place]: [phone]           Date of Service:   05/14/2021  Start Time:   9 AM End Time:   10 AM  Provider/Observer:  Ilean Skill, Psy.D.       Clinical Neuropsychologist       Billing Code/Service: [id number]  Chief Complaint:    Tara Murphy is a 75 year old female with history of breast cancer in 2015 with radiation therapy, right total hip and left total knee arthroplasty, history of recurrent UTIs, depression, peripheral neuropathy and left foot drop, restless leg syndrome, IBS.  Patient presented on 04/24/2021 after seizure-like activity left side jerking movements.  CT/MRI of head showed a hemorrhagic lesion of the paramedian superior right frontal lobe measuring 4.6 x 1.6 cm.  There is moderate surrounding vasogenic edema.  Findings were most consistent with solitary metastasis.  CT of the chest showed a large left upper lobe mass consistent with primary bronchogenic malignancy.  Patient was loaded with Keppra which is being weaned off now.  Patient underwent bilateral parietal craniotomy with resection of brain tumor microdissection intraoperative stereotactic navigation resection on 04/30/2021.  Due to decreased functional mobility patient was admitted to CIR.  Reason for Service:  Patient was referred for neuropsychological consultation due to coping and adjustment issues with a lot of concerns about how her cognition will be affected with radiation therapy.  Patient also with considerable psychosocial stressors associated with husband and his cognitive decline and behavioral changes.  Below see HPI for the current admission.  HPI: Tara Murphy is a 75 year old right-handed female with history of left breast cancer  2015 with radiation therapy, Hepatic steatosis intermittently elevated since 2018, right total hip and left total knee arthroplasty, quit smoking 29 years ago, history of recurrent UTIs, depression, 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  Per chart review lives with spouse.  1 level home one-step to entry.  Ambulates household distances with the use of a Rollator and has had a history of falls that began in February but was independent prior.  Presented 04/24/2021 after seizure-like activity left side jerking movements.  CT/MRI of the head showed a hemorrhagic lesion of the paramedian superior right frontal lobe measuring 2.6 x 1.6 cm.  There is moderate surrounding vasogenic edema.  The midline structures were normal.  Findings most consistent with solitary metastasis.  CT of the chest showed a large spiculated left upper lobe suprahilar mass consistent with primary bronchogenic malignancy.  EEG was negative for seizure.  She had been loaded with Keppra and remains on 1000 mg twice daily.  Patient underwent bilateral parietal craniotomy with resection of brain tumor microdissection intraoperative stereotactic navigation for volumetric resection 04/30/2021 per Dr. Annette Stable.  Decadron protocol as indicated.  Pathology report remains pending and neurosurgery plans to discuss with radiation oncology in regards to postoperative SRS treatment.  Tolerating a regular consistency diet.  Therapy evaluations completed due to patient decreased functional mobility was admitted for a comprehensive rehab program. She is very motivated for CIR!  Current Status:  Patient was awake and alert sitting up on the edge of her bed with her daughter also present in the room.  She was well oriented with good mental status.  Patient acknowledged on previous MoCA testing that she did have some difficulties with visual-spatial elements of the MoCA likely related to right parietal changes  post surgery.  Patient voiced her concerns about how ongoing oncology care may affect her cognitive functioning we addressed these issues.  Patient acknowledged a lot of stress around changes her husband's displaying with signs of progressive dementia with a family history of dementia.  Increased confabulations, anger, and avoidance of acknowledging his difficulties are leading to a lot of stress and concern with increased concerns for the patient's biological children as well.  Today we addressed issues reground her anxiety and stress and also addressed specific questions she has about what to expect for cognitive changes going forward.  Behavioral Observation: [employer]  presents as a 75 y.o.-year-old Right Caucasian Female who appeared her stated age. her dress was Appropriate and she was Well Groomed and her manners were Appropriate to the situation.  her participation was indicative of Appropriate behaviors.  There were physical disabilities noted.  she displayed an appropriate level of cooperation and motivation.     Interactions:    Active Appropriate and Attentive  Attention:   within normal limits and attention span and concentration were age appropriate  Memory:   within normal limits; recent and remote memory intact  Visuo-spatial:  abnormal  Speech (Volume):  normal  Speech:   normal; normal  Thought Process:  Coherent and Relevant  Though Content:  WNL; not suicidal and not homicidal  Orientation:   person, place, time/date, and situation  Judgment:   Good  Planning:   Fair  Affect:    Anxious  Mood:    Anxious and Dysphoric  Insight:   Good  Intelligence:   high  Medical History:   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[place])    S/p total hip replacement left   Breast cancer ([place]) 2015   left    Cataract    Cholecystolithiasis    Chronic allergic rhinitis    Depression    Former smoker,  stopped smoking in distant past 10/05/2019   Quit 2000; 72 y smoking history   GERD (gastroesophageal reflux disease)    Hepatic steatosis 06/08/2020   Intermittent elevated LFTs and ultrasound 2018   Hyperlipemia    IBS (irritable bowel syndrome) 10/08/2018   Nl colonoscopy and EGD 09/2018   IBS (irritable bowel syndrome)    Lichen plano-pilaris    Major depression, 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 Repeat 2020   Urinary tract infection    hx of frequent          Patient Active Problem List   Diagnosis Date Noted   Adjustment disorder    Cognitive impairment    Leukocytosis    Transaminitis    Postoperative pain    Left foot drop    Adenocarcinoma of left lung ([place]) 05/03/2021   Metastatic adenocarcinoma to brain ([place]) 05/03/2021   Brain tumor ([place] 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/2017   Restless leg syndrome 11/20/2017   Essential tremor 09/26/2016   OA (osteoarthritis) of knee 12/18/2015   OA (osteoarthritis) of hip 12/18/2015   Osteoarthritis of spine with radiculopathy, 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   History of Clostridium difficile colitis 06/16/2015   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  Family Med/Psych History:  Family History  Problem Relation Age of Onset   Arthritis Mother    Diabetes Mother    Crohn's disease Mother    Arthritis Father    Lung cancer Father    Breast cancer Sister 37   Hyperlipidemia Maternal Aunt    Stomach cancer Maternal Grandmother    Colitis Neg Hx    Rectal cancer Neg Hx    Esophageal cancer Neg Hx     Impression/DX:  Naira Waybright is a 75 year old female with history of breast cancer in 2015 with radiation therapy, right total hip and left total knee arthroplasty, history of recurrent UTIs, depression, peripheral neuropathy and left foot drop, restless leg syndrome, IBS.  Patient presented on 04/24/2021 after seizure-like activity left side jerking movements.  CT/MRI of head showed a hemorrhagic lesion of the paramedian superior right frontal lobe measuring 4.6 x 1.6 cm.  There is moderate surrounding vasogenic edema.  Findings were most consistent with solitary metastasis.  CT of the chest showed a large left upper lobe mass consistent with primary bronchogenic malignancy.  Patient was loaded with Keppra which is being weaned off now.  Patient underwent bilateral parietal craniotomy with resection of brain tumor microdissection intraoperative stereotactic navigation resection on 04/30/2021.  Due to decreased functional mobility patient was admitted to CIR.  Patient was awake and alert sitting up on the edge of her bed with her daughter also present in the room.  She was well oriented with good mental  status.  Patient acknowledged on previous MoCA testing that she did have some difficulties with visual-spatial elements of the MoCA likely related to right parietal changes post surgery.  Patient voiced her concerns about how ongoing oncology care may affect her cognitive functioning we addressed these issues.  Patient acknowledged a lot of stress around changes her husband's displaying with signs of progressive dementia with a family history of dementia.  Increased confabulations, anger, and avoidance of acknowledging his difficulties are leading to a lot of stress and concern with increased concerns for the patient's biological children as well.  Today we addressed issues reground her anxiety and stress and also addressed specific questions she has about what to expect for cognitive changes going forward.  Disposition/Plan:  Today we worked on coping and adjustment issues and also set up follow-up appointment for outpatient appointment with myself.  Diagnosis:  Brain mass - Plan: Ambulatory referral to Physical Medicine Rehab  Cognitive impairment - Plan: Ambulatory referral to Neuropsychology  Adjustment disorder, unspecified type - Plan: Ambulatory referral to Psychology         Electronically Signed   _______________________ Ilean Skill, Psy.D. Clinical Neuropsychologist

## 2021-05-14 NOTE — Progress Notes (Signed)
Speech Language Pathology Daily Session Note  Patient Details  Name: Tara Murphy MRN: [id number] Date of Birth: Oct 31, 1945  Today's Date: 05/14/2021 SLP Individual Time: 0832-0900 SLP Individual Time Calculation (min): 28 min  Short Term Goals: Week 1: SLP Short Term Goal 1 (Week 1): STG = LTG d/t ELOS  Skilled Therapeutic Interventions:Skilled ST services focused on education and cognitive skills. Pt's daughter present for education. SLP facilitated complex problem solving, alternating attention and error awareness in deductive reasoning tasks, pt required mod A verbal cues fading to supervision A verbal cues. Pt's daughter supports pt being 95% at cognitive baseline and SLP provided education to slow rate to assess for error and use of written aids for recall. All questions answered to satisfaction. Pt was left in room with family, call bell within reach and bed alarm set. SLP recommends to continue skilled services.     Pain Pain Assessment Pain Score: 0-No pain  Therapy/Group: Individual Therapy  Damarian Priola  [facility] 05/14/2021, 4:32 PM

## 2021-05-14 NOTE — Progress Notes (Signed)
PROGRESS NOTE   Subjective/Complaints: No complaints this morning Asks for foods that can help with her cognition Has meeting with Dr. Sima Matas today Daughter and husband present for caregiver training today  ROS: Denies CP, SOB, N/V, +cognitive deficits  Objective:   No results found. No results for input(s): WBC, HGB, HCT, PLT in the last 72 hours. No results for input(s): NA, K, CL, CO2, GLUCOSE, BUN, CREATININE, CALCIUM in the last 72 hours.  Intake/Output Summary (Last 24 hours) at 05/14/2021 1311 Last data filed at 05/13/2021 1758 Gross per 24 hour  Intake 240 ml  Output --  Net 240 ml        Physical Exam: Vital Signs Blood pressure 136/69, pulse 61, temperature 98.4 F (36.9 C), temperature source Oral, resp. rate 18, height 5\' 6"  (1.676 m), weight 95.9 kg, SpO2 97 %. Gen: no distress, normal appearing HEENT: oral mucosa pink and moist, NCAT Cardio: Reg rate Chest: normal effort, normal rate of breathing Abd: soft, non-distended Extremities: No clubbing, cyanosis, or edema Skin: No evidence of breakdown, no evidence of rash Neurologic: Cranial nerves II through XII intact, motor strength is 5/5 in bilateral deltoid, bicep, tricep, grip, hip flexor, knee extensors, ankle dorsiflexor and plantar flexor No diplopia on lateral gaze Neuro:  Eyes without evidence of nystagmus  Tone is normal without evidence of spasticity Cerebellar exam shows no evidence of ataxia on finger nose finger or heel to shin testing     Sensory exam is normal to pinprick, proprioception and light touch in the upper and lower limbs   Cranial nerves II- Visual fields are intact to confrontation testing, no blurring of vision III- no evidence of ptosis, upward, downward and medial gaze intact IV- no vertical diplopia or head tilt V- no facial numbness or masseter weakness VI- no pupil abduction weakness VII- no facial droop,  good lid closure VII- normal auditory acuity    Neuro: Alert. High level cognitive deficits.    Assessment/Plan: 1. Functional deficits which require 3+ hours per day of interdisciplinary therapy in a comprehensive inpatient rehab setting. Physiatrist is providing close team supervision and 24 hour management of active medical problems listed below. Physiatrist and rehab team continue to assess barriers to discharge/monitor patient progress toward functional and medical goals  Care Tool:  Bathing    Body parts bathed by patient: Face, Right arm, Left arm, Chest, Abdomen, Front perineal area, Right upper leg, Left upper leg, Right lower leg, Left lower leg, Buttocks   Body parts bathed by helper: Buttocks     Bathing assist Assist Level: Set up assist     Upper Body Dressing/Undressing Upper body dressing   What is the patient wearing?: Pull over shirt, Bra    Upper body assist Assist Level: Supervision/Verbal cueing    Lower Body Dressing/Undressing Lower body dressing      What is the patient wearing?: Underwear/pull up, Pants     Lower body assist Assist for lower body dressing: Supervision/Verbal cueing     Toileting Toileting    Toileting assist Assist for toileting: Supervision/Verbal cueing     Transfers Chair/bed transfer  Transfers assist     Chair/bed transfer assist level:  Contact Guard/Touching assist     Locomotion Ambulation   Ambulation assist      Assist level: Contact Guard/Touching assist Assistive device: Walker-rolling (L AFO) Max distance: 150'   Walk 10 feet activity   Assist     Assist level: Contact Guard/Touching assist Assistive device: Walker-rolling, Orthosis   Walk 50 feet activity   Assist Walk 50 feet with 2 turns activity did not occur: Safety/medical concerns  Assist level: Contact Guard/Touching assist Assistive device: Orthosis, Walker-rolling    Walk 150 feet activity   Assist Walk 150 feet  activity did not occur: Safety/medical concerns  Assist level: Contact Guard/Touching assist Assistive device: Walker-rolling, Orthosis    Walk 10 feet on uneven surface  activity   Assist Walk 10 feet on uneven surfaces activity did not occur: Safety/medical concerns         Wheelchair     Assist        Wheelchair assist level: Minimal Assistance - Patient > 75%      Wheelchair 50 feet with 2 turns activity    Assist    Wheelchair 50 feet with 2 turns activity did not occur: Safety/medical concerns       Wheelchair 150 feet activity     Assist  Wheelchair 150 feet activity did not occur: Safety/medical concerns       Blood pressure 136/69, pulse 61, temperature 98.4 F (36.9 C), temperature source Oral, resp. rate 18, height 5\' 6"  (1.676 m), weight 95.9 kg, SpO2 97 %.  Medical Problem List and Plan: 1.  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  Pathology reports pending Continue CIR PT OT speech 2.  Antithrombotics: -DVT/anticoagulation: SCDs Mechanical: Sequential compression devices, below knee Bilateral lower extremities             -antiplatelet therapy: N/A 3. Postoperative pain: Discontinue Norco 4. Anxiety: Continue BuSpar 7.5 mg twice daily as needed ,Ativan 1 mg every 6 hours as needed anxiety             -antipsychotic agents: N/A 5. Neuropsych: This patient is capable of making decisions on her own behalf. 6. Skin/Wound Care: Routine skin checks. Staples may be removed 8/22. Discussed with patient. Nursing order placed.  7. Fluids/Electrolytes/Nutrition: Routine in and outs with follow-up chemistries 8.  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  Continue Neurontin 600 mg twice daily. Continue use of AFO. Discussed  [place] outpatient. Scheduling outpatient [place] appointment for her.  9.  Seizure prophylaxis.  EEG negative.  Discontinue Keppra, completed course.  10.  Restless leg syndrome.  Continue Requip 4 mg nightly 11.  GERD.  Protonix 12.  History of left breast cancer 2015.  Follow-up outpatient 13.  Hepatic Steatosis intermittently elevated since 2018.    LFTs elevated, but improved on 8/12 14.  Steroid-induced hyperglycemia.  Hemoglobin A1c 5.5.  SSI 15. Leukocytosis:  WBCs 10.7 on 8/12 Afebrile, no signs of infection.  16. Bradycardia; monitor BP TID 17. Hypotension: monitor BP TID 18Disposition: HFU scheduled.  19.  Transient visual blurring likely fatigue with accommodation lag.  No further work-up needed at this time. LOS: 11 days A FACE TO FACE EVALUATION WAS PERFORMED  Martha Clan P Bernie Ransford 05/14/2021, 1:11 PM

## 2021-05-14 NOTE — Progress Notes (Signed)
Physical Therapy Session Note  Patient Details  Name: Tara Murphy MRN: [id number] Date of Birth: 06-05-1946  Today's Date: 05/14/2021 PT Individual Time: 1112-1147 PT Individual Time Calculation (min): 35 min   Short Term Goals: Week 2:  PT Short Term Goal 1 (Week 2): STG=LTG due to LOS  Skilled Therapeutic Interventions/Progress Updates:    Patient seated EOB and reports emotionally draining morning meeting with spouse and children, then with neuropsychologist.  C/o continued insomnia awakens at 0230 and cannot return to sleep.  Patient agreeable to PT.  Sit to stand to RW with S already wearing AFO on L LE ambulated to elevators 150' with RW and CGA.  Assisted in w/c to ortho gym.  Performed standing balance activity at BITS using L UE support on back of chair and tapping numbers and letters in sequence with close S and occasional questioning cues standing for over 6.5 minutes.  Patient assisted in w/c to therapy gym.  Performed standing forward step ups to 6" step using RW x 8 reps alternating up with R then L foot first. Performed side step ups to 4" step with chair back for UE support and min to mod A with pt c/o fear due to difficulty of activity.  Performed x 3 reps with prolonged seated rest in between.  Patient assisted to room in w/c.  Stand step to recliner and left with call bell and needs in reach.   Therapy Documentation Precautions:  Precautions Precautions: Fall Precaution Comments: seizure, LLE weakness/foot drop Required Braces or Orthoses: Other Brace Other Brace: L AFO Restrictions Weight Bearing Restrictions: No  Pain: Pain Assessment Pain Score: 0-No pain    Therapy/Group: Individual Therapy  Reginia Naas Magda Kiel, PT 05/14/2021, 12:41 PM

## 2021-05-14 NOTE — Progress Notes (Signed)
Occupational Therapy Session Note  Patient Details  Name: Tara Murphy MRN: [id number] Date of Birth: January 08, 1946  Today's Date: 05/14/2021 OT Individual Time: 0730-0830 OT Individual Time Calculation (min): 60 min    Short Term Goals: Week 2:  OT Short Term Goal 1 (Week 2): STG = LTGs due to remaining LOS  Skilled Therapeutic Interventions/Progress Updates:    Treatment session with focus on family education with pt and pt's husband and daughter.  Pt received seated EOB agreeable to therapy session.  Pt and family asking multiple questions about energy conservation, activity tolerance, therapeutic activities and exercises for home, fall prevention and recovery, diet modifications, and overall mobility.  Therapist answered questions to pt and family satisfaction.  Pt completed sit >stand with RW and therapist educated family members on proper hand placement during sit > stand.  Discussed possible recommendation for bed rail as pt with intermittent difficulty with bed mobility and due to upcoming radiation/chemotherapy may fluctuate in ability.  Pt's daughter provided CGA fading to supervision with ambulatory transfers in room to toilet and then when stepping in to shower.  Pt completed bathing at seated level with lateral leans to wash buttocks.  Min cues for transfer technique when exiting shower.  Pt engaged in dressing from EOB, with improved ability to recall hemi-technique and completed LB dressing with only supervision when standing to pull pants over hips.  Pt able to use AE to don AFO and shoes without assistance.  Pt remained seated EOB with family awaiting next therapy session.  Therapy Documentation Precautions:  Precautions Precautions: Fall Precaution Comments: seizure, LLE weakness/foot drop Required Braces or Orthoses: Other Brace Other Brace: L AFO Restrictions Weight Bearing Restrictions: No General:   Vital Signs: Therapy Vitals Temp: 98.4 F (36.9 C) Temp Source:  Oral Pulse Rate: 61 Resp: 18 BP: 136/69 Patient Position (if appropriate): Lying Oxygen Therapy SpO2: 97 % O2 Device: Room Air Pain:  Pt with no c/o pain   Therapy/Group: Individual Therapy  Simonne Come 05/14/2021, 8:27 AM

## 2021-05-14 NOTE — Progress Notes (Signed)
Physical Therapy Session Note  Patient Details  Name: Tara Murphy MRN: [id number] Date of Birth: 08-21-1946  Today's Date: 05/14/2021 PT Individual Time: 1334-1430 PT Individual Time Calculation (min): 56 min   Short Term Goals: Week 1:  PT Short Term Goal 1 (Week 1): Pt will consistently perform STS CGA PT Short Term Goal 1 - Progress (Week 1): Met PT Short Term Goal 2 (Week 1): Pt will ambulate 59f MinA PT Short Term Goal 2 - Progress (Week 1): Met PT Short Term Goal 3 (Week 1): Pt will complete supine<>EOB CGA PT Short Term Goal 3 - Progress (Week 1): Progressing toward goal PT Short Term Goal 4 (Week 1): Pt will complete standard height steps MinA PT Short Term Goal 4 - Progress (Week 1): Met Week 2:  PT Short Term Goal 1 (Week 2): STG=LTG due to LOS  Skilled Therapeutic Interventions/Progress Updates:  Pt received sitting in recliner in room w/L AFO donned, denied pain but reported fatigue 2/2 not sleeping night prior. Emphasis of session on dynamic gait stability and community reintegration. Pt [facility] sit <>stand [facility]vision and was transported w/total A from room to 1st floor patio for time management. Sit <>stand and pt ambulated >300' w/RW and supervision on concrete sidewalk, on incline/decline and navigating around barriers. Educated pt on ascending/descending curb w/RW, pt able to perform w/supervision. Pt transported to ortho gym w/total A 2/2 fatigue and pt performed car transfer w/RW and supervision. Pt transported back to room w/total A and performed sit <>stand pivot w/supervision to EOB. Bed mobility w/supervision and pt was left supine in bed w/all needs in reach.   Therapy Documentation Precautions:  Precautions Precautions: Fall Precaution Comments: seizure, LLE weakness/foot drop Required Braces or Orthoses: Other Brace Other Brace: L AFO Restrictions Weight Bearing Restrictions: No   Therapy/Group: Individual Therapy JCruzita LedererPlaster,  PT, DPT  05/14/2021, 7:53 AM

## 2021-05-14 NOTE — Discharge Summary (Signed)
Physician Discharge Summary  Patient ID: Alfred Eckley MRN: [id number] DOB/AGE: 1946-07-13 75 y.o.  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 The patient reports multiple falls since yesterday. Initial encounter. EXAM: LEFT KNEE - 1-2 VIEW COMPARISON:  None. FINDINGS: There is no acute bony or joint abnormality. Total knee arthroplasty is in place. Hardware is intact without complicating feature. Soft tissues are negative. IMPRESSION: No acute abnormality.  Status post left knee replacement. Electronically Signed   By: Inge Rise M.D.   On: 04/17/2021 20:37   DG Ankle Complete Left  Result Date: 04/17/2021 CLINICAL DATA:  Left ankle pain. The patient reports multiple falls since yesterday. Initial encounter. EXAM: LEFT ANKLE COMPLETE - 3+ VIEW COMPARISON:  None. FINDINGS: There is no evidence of fracture, dislocation, or joint effusion. There is no evidence of arthropathy or other focal bone abnormality. Small plantar calcaneal spur incidentally noted. Soft tissues are unremarkable. IMPRESSION: Negative exam. Electronically Signed   By: Inge Rise M.D.   On: 04/17/2021 20:34   CT HEAD WO CONTRAST (5MM)  Result Date: 04/24/2021 CLINICAL DATA:  Seizure. EXAM: CT HEAD WITHOUT CONTRAST TECHNIQUE: Contiguous axial images were obtained from the base of the skull through the vertex without intravenous contrast. COMPARISON:  None. FINDINGS: Brain:  There is mild cerebral atrophy with widening of the extra-axial spaces and ventricular dilatation. There are areas of decreased attenuation within the white matter tracts of the supratentorial brain, consistent with microvascular disease changes. A 2.2 cm x 1.9 cm x 2.2 cm well-defined low-attenuation white matter mass (approximately 22.91 Hounsfield units) is seen within the parasagittal posterior right frontal lobe, along the vertex (axial CT images 25 through 29, CT series 3). This contains a small hemorrhagic component which measures approximately 1.2 cm x 1.0 cm x 0.6 cm). A moderate to marked amount of surrounding white matter vasogenic edema is seen. Mass effect is seen on the adjacent sulci without evidence of midline shift. Vascular: No hyperdense vessel or unexpected calcification. Skull: Normal. Negative for fracture or focal lesion. Sinuses/Orbits: No acute finding. Other: None. IMPRESSION: 1. Hemorrhagic posterior right frontal lobe mass and associated vasogenic edema, as described above. MRI correlation is recommended. Electronically Signed   By: Virgina Norfolk M.D.   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 EXAM: MRI HEAD WITHOUT AND WITH CONTRAST TECHNIQUE: Multiplanar, multiecho pulse sequences of the brain and surrounding structures were obtained without and with intravenous contrast. CONTRAST:  19mL GADAVIST GADOBUTROL 1 MMOL/ML IV SOLN COMPARISON:  04/28/2021.  04/24/2021. FINDINGS: Brain: Interval right frontoparietal vertex craniotomy for resection of a cystic hemorrhagic metastasis at the vertex. Gross total resection of the mass. Blood products within the post resection space, which measures 16 mm, compared with the lesion size of 25 mm. Enhancement along the margins of the resection is not unexpected and will need to be followed for evidence of regression or progression.  Regional vasogenic edema  appears similar. No new brain lesion is seen. No evidence of perioperative infarction. No hydrocephalus. No extra-axial collection. Vascular: Major vessels at the base of the brain show flow. Superior sagittal sinus remains patent. Skull and upper cervical spine: Otherwise negative Sinuses/Orbits: Clear/normal Other: None IMPRESSION: Gross total resection of the hemorrhagic cystic metastasis at the right brain vertex. No operative complication is discernible. Regional edema appears similar. Linear enhancement along the margins of the post resection space will need to be followed for evidence regression or progression. Electronically Signed   By: Nelson Chimes M.D.   On: 05/01/2021 07:46   MR BRAIN W WO CONTRAST  Result Date: 04/28/2021 CLINICAL DATA:  Left lower extremity weakness. Breast carcinoma. Brain lab protocol. EXAM: MRI HEAD WITHOUT AND WITH CONTRAST TECHNIQUE: Multiplanar, multiecho pulse sequences of the brain and surrounding structures were obtained without and with intravenous contrast. CONTRAST:  9.26mL GADAVIST GADOBUTROL 1 MMOL/ML IV SOLN COMPARISON:  Brain MRI 04/24/2021 FINDINGS: Brain: Unchanged appearance of hemorrhagic lesion in the paramedian right frontal lobe with surrounding edema. Blood fluid level within the lesion. No midline shift. Vascular: Major flow voids are preserved. Skull and upper cervical spine: Normal calvarium and skull base. Visualized upper cervical spine and soft tissues are normal. Sinuses/Orbits:No paranasal sinus fluid levels or advanced mucosal thickening. No mastoid or middle ear effusion. Normal orbits. IMPRESSION: BrainLAB protocol showing unchanged appearance of hemorrhagic lesion in the paramedian right frontal lobe with surrounding edema. Electronically Signed   By: Ulyses Jarred M.D.   On: 04/28/2021 20:09   MR Brain W and Wo Contrast  Result Date: 04/25/2021 CLINICAL DATA:  Cancer of unknown primary, staging EXAM: MRI HEAD WITHOUT  AND WITH CONTRAST TECHNIQUE: Multiplanar, multiecho pulse sequences of the brain and surrounding structures were obtained without and with intravenous contrast. CONTRAST:  60mL GADAVIST GADOBUTROL 1 MMOL/ML IV SOLN COMPARISON:  None. FINDINGS: Brain: Hemorrhagic lesion of the paramedian superior right frontal lobe measures 2.6 x 1.6 cm. There is moderate surrounding vasogenic edema. There is a hematocrit level within the lesion. Normal white matter signal, parenchymal volume and CSF spaces. The midline structures are normal. Contrast enhancement of the above described lesion is predominantly peripheral. There are no other contrast-enhancing lesions. Vascular: Major flow voids are preserved. Skull and upper cervical spine: Normal calvarium and skull base. Visualized upper cervical spine and soft tissues are normal. Sinuses/Orbits:No paranasal sinus fluid levels or advanced mucosal thickening. No mastoid or middle ear effusion. Normal orbits. IMPRESSION: Hemorrhagic lesion of the paramedian superior right frontal lobe with moderate surrounding vasogenic edema. This is most consistent with a solitary metastasis. Electronically Signed   By: Ulyses Jarred M.D.   On: 04/25/2021 00:15   CT CHEST ABDOMEN PELVIS W CONTRAST  Result Date: 04/25/2021 CLINICAL DATA:  Cancer of unknown primary, staging, history of left breast cancer in 2015, intracranial mass EXAM: CT CHEST, ABDOMEN, AND PELVIS WITH CONTRAST TECHNIQUE: Multidetector CT imaging of the chest, abdomen and pelvis was performed following the standard protocol during bolus administration of intravenous contrast. CONTRAST:  149mL OMNIPAQUE IOHEXOL 300 MG/ML  SOLN COMPARISON:  None. FINDINGS: CT CHEST FINDINGS Cardiovascular: The heart and great vessels are unremarkable without pericardial effusion. Normal caliber of the thoracic aorta. Prominent atherosclerosis of the coronary vasculature. Mediastinum/Nodes: No pathologic mediastinal, hilar, or axillary adenopathy.  Thyroid, trachea, and esophagus are unremarkable. Lungs/Pleura: There is a left upper lobe spiculated suprahilar mass measuring 3.5 x 2.0 by 4.3 cm, consistent with primary bronchogenic neoplasm. There is an indeterminate 8  mm right upper lobe pulmonary nodule image 57/5, concerning for metastatic disease. No other acute airspace disease, effusion, or pneumothorax. Central airways are patent. Musculoskeletal: There are no acute or destructive bony lesions. Reconstructed images demonstrate no additional findings. CT ABDOMEN PELVIS FINDINGS Hepatobiliary: Gallbladder is surgically absent. No focal liver abnormalities. No biliary duct dilation. Pancreas: Unremarkable. No pancreatic ductal dilatation or surrounding inflammatory changes. Spleen: Normal in size without focal abnormality. Adrenals/Urinary Tract: Adrenal glands are unremarkable. Kidneys are normal, without renal calculi, focal lesion, or hydronephrosis. Evaluation of the bladder is limited due to streak artifact from bilateral hip arthroplasties. No gross abnormality. Stomach/Bowel: No bowel obstruction or ileus. Scattered diverticulosis of the distal colon without diverticulitis. No bowel wall thickening or inflammatory change. Vascular/Lymphatic: Aortic atherosclerosis. No enlarged abdominal or pelvic lymph nodes. Reproductive: Coarse uterine calcifications likely represent degenerating fibroids. No adnexal masses. Other: No free fluid or free gas.  No abdominal wall hernia. Musculoskeletal: Unremarkable bilateral hip arthroplasties. No acute or destructive bony lesions. Reconstructed images demonstrate no additional findings. IMPRESSION: 1. Large spiculated left upper lobe suprahilar mass most consistent with primary bronchogenic malignancy. This may be amenable to endobronchial sampling given proximity to the left upper bronchus. 2. Indeterminate 8 mm right upper lobe pulmonary nodule. Metastatic disease cannot be excluded. Attention on follow-up  recommended. 3. No acute intra-abdominal or intrapelvic process. No evidence of subdiaphragmatic metastases. 4. Sigmoid diverticulosis without diverticulitis. 5. Aortic Atherosclerosis (ICD10-I70.0). Coronary artery atherosclerosis. Electronically Signed   By: Randa Ngo M.D.   On: 04/25/2021 19:18   EEG adult  Result Date: 04/25/2021 Lora Havens, MD     04/25/2021 12:46 PM Patient Name: Lilou Kneip MRN: [id number] Epilepsy Attending: Lora Havens Referring Physician/Provider: Dr Irene Pap Date: 04/25/2021 Duration: 22.33 mins Patient history:  75 year old female with new onset seizure with metastatic brain lesion. EEG to evaluate for seizure Level of alertness: Awake AEDs during EEG study: LEV, GBP Technical aspects: This EEG study was done with scalp electrodes positioned according to the 10-20 International system of electrode placement. Electrical activity was acquired at a sampling rate of 500Hz  and reviewed with a high frequency filter of 70Hz  and a low frequency filter of 1Hz . EEG data were recorded continuously and digitally stored. Description: The posterior dominant rhythm consists of 9 Hz activity of moderate voltage (25-35 uV) seen predominantly in posterior head regions, symmetric and reactive to eye opening and eye closing. Hyperventilation and photic stimulation were not performed.   IMPRESSION: This study is within normal limits. No seizures or epileptiform discharges were seen throughout the recording. Priyanka Barbra Sarks   Overnight EEG with video  Result Date: 04/27/2021 Lora Havens, MD     04/28/2021  6:56 AM Patient Name: Danayah Smyre MRN: [id number] Epilepsy Attending: Lora Havens Referring Physician/Provider: Dr Donnetta Simpers Duration: 04/26/2021 1206 to 04/27/2021 1206  Patient history:  75 year old female with new onset seizure with metastatic brain lesion. EEG to evaluate for seizure  Level of alertness: Awake, asleep  AEDs during EEG study: LEV, GBP  Technical  aspects: This EEG study was done with scalp electrodes positioned according to the 10-20 International system of electrode placement. Electrical activity was acquired at a sampling rate of 500Hz  and reviewed with a high frequency filter of 70Hz  and a low frequency filter of 1Hz . EEG data were recorded continuously and digitally stored.  Description: The posterior dominant rhythm consists of 8-9 Hz activity of moderate voltage (25-35 uV) seen predominantly in posterior head regions, symmetric and  reactive to eye opening and eye closing. Sleep was characterized by vertex waves, sleep spindles (12 to 14 Hz), maximal frontocentral region.  Hyperventilation and photic stimulation were not performed.    IMPRESSION: This study is within normal limits. No seizures or epileptiform discharges were seen throughout the recording.  Priyanka Barbra Sarks   DG Hip Unilat W or Wo Pelvis 2-3 Views Left  Result Date: 04/17/2021 CLINICAL DATA:  Left leg pain weakness. The patient reports multiple falls since yesterday. Initial encounter. EXAM: DG HIP (WITH OR WITHOUT PELVIS) 2-3V LEFT COMPARISON:  None. FINDINGS: No acute bony or joint abnormality is seen on the right or left. 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 Hardware is intact without complicating feature. Soft tissues are negative. IMPRESSION: No acute abnormality. Status post bilateral total hip replacement. Electronically Signed   By: Inge Rise M.D.   On: 04/17/2021 20:36    Labs:  Basic Metabolic Panel: No results for input(s): NA, K, CL, CO2, GLUCOSE, BUN, CREATININE, CALCIUM, MG, PHOS in the last 168 hours.  CBC: No results for input(s): WBC, NEUTROABS, HGB, HCT, MCV, PLT in the last 168 hours.  CBG: Recent Labs  Lab 05/14/21 2138 05/15/21 0543 05/15/21 1148 05/15/21 1701 05/15/21 2033  GLUCAP 105* 113* 99 104* 102*   Family history.  Mother with diabetes and Crohn's disease.  Father with lung cancer Sister with breast cancer.  Negative  for colitis rectal cancer or esophageal cancer  Brief HPI:   Brandye Inthavong is a 75 y.o. 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  Per chart review lives with spouse ambulates household distances with a Rollator.  Presented 04/24/2021 after seizure-like activity left side jerking movements.  CT/MRI of the head showed a hemorrhagic lesion of the paramedian superior right frontal lobe measuring 2.6 x 1.6 cm.  There is moderate surrounding vasogenic edema the midline structures were normal.  Findings most consistent with solitary metastasis.  CT of the chest showed a large spiculated left upper lobe suprahilar mass consistent with primary bronchogenic malignancy.  EEG negative for seizure.  She had been loaded with Keppra.  Patient underwent bilateral parietal craniotomy resection of brain tumor microdissection intraoperative stereotactic navigation of volumetric resection 04/30/2021 per Dr. Annette Stable.  Decadron therapy as indicated.  Pathology report pending neurosurgery to discuss with radiation oncology in regards to postoperative SRS treatment.  Tolerating a regular diet.  Therapy evaluations completed due to patient decreased functional mobility was admitted for a comprehensive rehab program.   Hospital Course: [facility] was admitted to rehab 05/03/2021 for inpatient therapies to consist of PT, ST and OT at least three hours five days a week. Past admission physiatrist, therapy team and rehab RN have worked together to provide customized collaborative inpatient rehab.  Pertaining to patient's hemorrhagic lesion in the paramedian superior right frontal lobe measuring 2.6 x 1.6 cm vasogenic edema had undergone bilateral  parietal craniotomy resection 04/30/2021 per Dr. Annette Stable.  Patient also to follow-up with radiation oncology.  SCDs for DVT prophylaxis.  Postoperative pain with Norco.  Mood stabilization with BuSpar as well as Ativan with emotional support provided.  Patient did have a history of peripheral idiopathic neuropathy left foot drop continued on Neurontin.  She was using an AFO.  Scheduling outpatient [place] appointment.  Seizure prophylaxis EEG negative Keppra as directed completing course.  Restless leg syndrome Requip nightly.  History of hepatic steatosis intermittently elevated since 2018 and monitored latest LFTs improved.  Patient did have some mildly elevated blood sugars related to Decadron and monitored.   Blood pressures were monitored on TID basis and soft and monitored     Rehab course: During patient's stay in rehab weekly team conferences were held to monitor patient's progress, set goals and discuss barriers to discharge. At admission, patient required moderate assist sit to supine minimal assist supine to sit moderate assist 25 feet rolling walker  Physical exam.  Blood pressure 118/70 pulse 80 temperature 98 respirations 18 oxygen saturation 92% room air Constitutional.  No acute distress HEENT Head.  Normocephalic craniotomy site clean and dry Eyes.  Pupils round and reactive to light no discharge.nystagmus Neck.  Supple nontender no JVD without thyromegaly Cardiac regular rate rhythm not any extra sounds or murmur heard Abdomen.  Soft nontender positive bowel sounds without rebound Respiratory effort normal no respiratory distress without wheeze Neurologic.  Bit lethargic but arousable makes eye contact with examiner follows commands.  Provides name and age.  Left side foot drop 0/5 dorsiflexion  He/She  has had improvement in activity tolerance, balance, postural control as well as ability to compensate for deficits. He/She has had improvement in functional use RUE/LUE  and RLE/LLE  as well as improvement in awareness.  Sessions with emphasis on functional mobility transfers generalized strengthening and dynamic standing.  Sit to stand rolling walker contact-guard assist ambulates 12 feet rolling walker contact-guard.  Sit to stand contact-guard and donned lite gait harness.  She was able to increase her ambulation up to 92 feet.  Required some cues for upward posture/gaze.  Donned AFO Footwear modified independent.  Worked on dynamic standing balance for functional carryover during ADLs/IADL.  Supervision for ambulation while outdoors.  Speech therapy follow-up introduced various language task word scrambled word generation etc. in a couple logic puzzles.  Patient was able to perform all language task with minimal difficulty.  Full family teaching completed plan discharged to home       Disposition: Discharged to home    Diet: Carb modified  Special Instructions: No driving smoking or alcohol  Medications at discharge 1.  BuSpar 7.5 mg twice daily as needed 2. Neurontin 600 mg twice daily 3.  Protonix 40 mg p.o. daily 4.  Requip 4 mg nightly 5.  Decadron 2 mg every 12 hours  30-35 minutes were spent completing discharge summary and discharge planning  Discharge Instructions     Ambulatory referral to Neuropsychology   Complete by: As directed    Ambulatory referral to Physical Medicine Rehab   Complete by: As directed    Moderate complexity follow-up 1 to 2 weeks of brain tumor/paramedian mass status post right frontal craniotomy   Ambulatory referral to Psychology   Complete by: As directed    Hospital follow up appt        Follow-up Information     Raulkar, Clide Deutscher, MD Follow up.   Specialty: Physical Medicine and Rehabilitation Why: 07/05/21 please arrive at 11:20am for 11:40am appointment (20 minutes) 08/03/21: please arrive at 10:20 for 10:40am appointment for application of Qutenza patches to feet for idiopathic peripheral neuropathy (40  minutes). Contact information: [address] [place] [address] [phone]         Earnie Larsson, MD Follow up.   Specialty: Neurosurgery Why: Call for appointment Contact  information: [address] [place] [address] [phone]         Benay Pike, MD Follow up.   Specialty: Hematology and Oncology Why: Call for appointment Contact information: [place] [address] [phone]                 Signed: Cathlyn Parsons 05/16/2021, 5:40 AM

## 2021-05-15 ENCOUNTER — Ambulatory Visit (HOSPITAL_BASED_OUTPATIENT_CLINIC_OR_DEPARTMENT_OTHER): Payer: Medicare Other | Admitting: Physical Therapy

## 2021-05-15 LAB — GLUCOSE, CAPILLARY
Glucose-Capillary: 113 mg/dL — ABNORMAL HIGH (ref 70–99)
Glucose-Capillary: 99 mg/dL (ref 70–99)
Glucose-Capillary: 104 mg/dL — ABNORMAL HIGH (ref 70–99)
Glucose-Capillary: 102 mg/dL — ABNORMAL HIGH (ref 70–99)

## 2021-05-15 MED ORDER — DEXAMETHASONE 2 MG PO TABS: 2.0000 mg | ORAL_TABLET | Freq: Two times a day (BID) | ORAL | 0 refills | Status: DC

## 2021-05-15 MED ORDER — PANTOPRAZOLE SODIUM 40 MG PO TBEC: 40.0000 mg | DELAYED_RELEASE_TABLET | Freq: Every day | ORAL | 0 refills | Status: DC

## 2021-05-15 MED ORDER — GABAPENTIN 300 MG PO CAPS: 600.0000 mg | ORAL_CAPSULE | Freq: Two times a day (BID) | ORAL | 0 refills | Status: DC

## 2021-05-15 MED ORDER — BUSPIRONE HCL 7.5 MG PO TABS: 7.5000 mg | ORAL_TABLET | Freq: Two times a day (BID) | ORAL | 0 refills | Status: AC | PRN

## 2021-05-15 MED ORDER — ROPINIROLE HCL 4 MG PO TABS: 4.0000 mg | ORAL_TABLET | Freq: Every day | ORAL | 0 refills | Status: DC

## 2021-05-15 MED ORDER — MELATONIN 3 MG PO TABS
3.0000 mg | ORAL_TABLET | Freq: Every day | ORAL | Status: DC
  Administered 2021-05-15: 3 mg via ORAL
  Filled 2021-05-15: qty 1

## 2021-05-15 NOTE — Progress Notes (Signed)
Patient ID: Tara Murphy, female   DOB: 1945-12-26, 75 y.o.   MRN: [id number]  met with pt to discuss team conference aware of progress toward goals of discharge tomorrow. Her daughter and  husband were in for education and it went well and she feels prepared to go home. She feels once there it will get smoother, she is somewhat anxious about going home. Gave her the handicapped application knows where to take to receive placard. See in am for any last minute questions.

## 2021-05-15 NOTE — Progress Notes (Addendum)
Occupational Therapy Discharge Summary  Patient Details  Name: Tara Murphy MRN: [id number] Date of Birth: [birth year]   Patient has met 37 of 11 long term goals due to improved activity tolerance, improved balance, postural control, ability to compensate for deficits, functional use of  LEFT upper and LEFT lower extremity, improved attention, improved awareness, and improved coordination.  Patient to discharge at overall Supervision level.  Patient's care partner is independent to provide the necessary  supervision  assistance at discharge.  Patient's husband and adult daughter have been present for family education session and verbalize and demonstrate understanding and ability to provide care at recommended level.  Reasons goals not met: N/A  Recommendation:  Patient will benefit from ongoing skilled OT services in outpatient setting to continue to advance functional skills in the area of BADL and Reduce care partner burden.  Equipment: No equipment provided  Reasons for discharge: treatment goals met and discharge from hospital  Patient/family agrees with progress made and goals achieved: Yes  OT Discharge Precautions/Restrictions  Precautions Precautions: Fall Precaution Comments: L foot drop Required Braces or Orthoses: Other Brace Other Brace: L AFO  Pain Pain Assessment Pain Scale: 0-10 Pain Score: 0-No pain ADL ADL Equipment Provided: Reacher, Long-handled shoe horn Eating: Modified independent Where Assessed-Eating: Edge of bed Grooming: Modified independent Where Assessed-Grooming: Standing at sink Upper Body Bathing: Setup Where Assessed-Upper Body Bathing: Shower Lower Body Bathing: Setup Where Assessed-Lower Body Bathing: Shower Upper Body Dressing: Independent Where Assessed-Upper Body Dressing: Edge of bed Lower Body Dressing: Supervision/safety Where Assessed-Lower Body Dressing: Edge of bed Toileting: Supervision/safety Where Assessed-Toileting:  Glass blower/designer: Distant supervision Armed forces technical officer Method: Counselling psychologist: Energy manager: Medical sales representative Method: Librarian, academic: Facilities manager: Close supervision Social research officer, government Method: Heritage manager: Radio broadcast assistant, Grab bars Vision Baseline Vision/History: 0 No visual deficits Patient Visual Report: No change from baseline Vision Assessment?: No apparent visual deficits Perception  Perception: Within Functional Limits Praxis Praxis: Intact Cognition Overall Cognitive Status: Within Functional Limits for tasks assessed Arousal/Alertness: Awake/alert Year: 2022 Month: August Day of Week: Correct Attention: Alternating Alternating Attention: Appears intact Memory: Appears intact Immediate Memory Recall: Sock;Bed;Blue Awareness: Appears intact Problem Solving: Appears intact Problem Solving Impairment: Functional complex;Verbal complex Safety/Judgment: Appears intact Sensation Sensation Light Touch: Impaired by gross assessment Proprioception: Impaired by gross assessment Additional Comments: decreased sensation LLE>RLE with reports of neuropathy Coordination Gross Motor Movements are Fluid and Coordinated: No Fine Motor Movements are Fluid and Coordinated: No Coordination and Movement Description: mild L hemiparesis, generalized weakness/deconditioning, decreaesd balance/postural control Finger Nose Finger Test: mild dysmetria bilaterally Heel Shin Test: WFL on RLE, unable to perform without compensating on LLE 9 Hole Peg Test: R: 29 and L: 25 (on eval R:40 and L: 31) Motor  Motor Motor: Hemiplegia Motor - Skilled Clinical Observations: mild L hemiparesis, generalized weakness/deconditioning, decreaesd balance/postural control Mobility  Bed Mobility Bed Mobility: Rolling Right;Rolling Left;Sit to Supine;Supine to  Sit Rolling Right: Supervision/verbal cueing Rolling Left: Supervision/Verbal cueing Supine to Sit: Supervision/Verbal cueing Sit to Supine: Supervision/Verbal cueing Transfers Sit to Stand: Supervision/Verbal cueing Stand to Sit: Supervision/Verbal cueing  Trunk/Postural Assessment  Cervical Assessment Cervical Assessment: Within Functional Limits Thoracic Assessment Thoracic Assessment: Exceptions to [facility] (rounded shoulders) Lumbar Assessment Lumbar Assessment: Exceptions to [facility] (posterior pelvic tilt) Postural Control Postural Control: Deficits on evaluation  Balance Balance Balance Assessed: Yes Static Sitting Balance Static Sitting - Balance Support: Feet supported;Bilateral upper extremity  supported Static Sitting - Level of Assistance: 7: Independent Dynamic Sitting Balance Dynamic Sitting - Balance Support: Feet supported;No upper extremity supported Dynamic Sitting - Level of Assistance: 6: Modified independent (Device/Increase time) Static Standing Balance Static Standing - Balance Support: Bilateral upper extremity supported (RW) Static Standing - Level of Assistance: 5: Stand by assistance (supervision) Dynamic Standing Balance Dynamic Standing - Balance Support: Bilateral upper extremity supported (RW) Dynamic Standing - Level of Assistance: 5: Stand by assistance (supervision) Extremity/Trunk Assessment RUE Assessment RUE Assessment: Within Functional Limits General Strength Comments: 5/5 in shoulder flexion LUE Assessment LUE Assessment: Within Functional Limits Active Range of Motion (AROM) Comments: WFL General Strength Comments: 4+/5   Tara Murphy 05/15/2021, 2:26 PM

## 2021-05-15 NOTE — Patient Care Conference (Signed)
Inpatient RehabilitationTeam Conference and Plan of Care Update Date: 05/15/2021   Time: 1:30 PM    Patient Name: Tara Murphy      Medical Record Number: [id number]  Date of Birth: [birth year] Sex: Female         Room/Bed: 49C02C/5C02C-01 Payor Info: Payor: MEDICARE / Plan: MEDICARE PART A AND B / Product Type: *No Product type* /    Admit Date/Time:  05/03/2021  6:15 PM  Primary Diagnosis:  Brain tumor [facility])  Hospital Problems: Principal Problem:   Brain tumor ([place]) Active Problems:   Leukocytosis   Transaminitis   Postoperative pain   Left foot drop   Adjustment disorder   Cognitive impairment    Expected Discharge Date: Expected Discharge Date: 05/16/21  Team Members Present: Physician leading conference: Dr. Leeroy Cha Social Worker Present: Ovidio Kin, LCSW Nurse Present: Dorthula Nettles, RN PT Present: Becky Sax, PT OT Present: Simonne Come, OT SLP Present: Charolett Bumpers, SLP PPS Coordinator present : Ileana Ladd, PT     Current Status/Progress Goal Weekly Team Focus  Bowel/Bladder   continent b/b  remain continent      Swallow/Nutrition/ Hydration             ADL's   Supervision bathing with lateral leans, CGA - Supervision ambulatory transfers with RW, Supervision dressing at sit > stand level  Supervision overall, Mod I sit > stand, grooming, and UB dressing  ADL retraining, functional transfers, L attention and L side NMR, cognition, pt/family education, d/c planning   Mobility   S bed mobility, transfers with RW, gait up to 180' with RW CGA with L AFO, CGA steps with rails  supervision/CGA  caregiver ed, balance, safety, L LE strength, HEP instruction   Communication   supervison A - Mod I  Mod I  education no f/u   Safety/Cognition/ Behavioral Observations  supervision A- Mod I  Mod I  education no f/u   Pain   denies pain  < 3  assess pain q 4hr and prn   Skin   cdi  no new skin breakdown  assess skin q shift and prn     Discharge  Planning:  Home with husband who can be there with her and provide supervision level. OP arranged for her   Team Discussion: Weaned of Keppra, cognition improving. Continent B/B, denies pain. All therapy goals have been met. Ready for discharge. Patient on target to meet rehab goals: yes  *See Care Plan and progress notes for long and short-term goals.   Revisions to Treatment Plan:  Continue anxiety medications.  Teaching Needs: Education complete, ready for discharge.  Current Barriers to Discharge: Decreased caregiver support, Medical stability, Home enviroment access/layout, Lack of/limited family support, Weight, and Medication compliance  Possible Resolutions to Barriers: Continue current medications, provide emotional support.     Medical Summary Current Status: higher- level work finding impairements, anxiety regarding return to home/husband's health  Barriers to Discharge: Medical stability;Wound care  Barriers to Discharge Comments: higher- level work finding impairements, anxiety regarding return to home/husband's health Possible Resolutions to [employer]: continue current anxiety regimen, provided list of foods that can help with anxiety   Continued Need for Acute Rehabilitation Level of Care: The patient requires daily medical management by a physician with specialized training in physical medicine and rehabilitation for the following reasons: Direction of a multidisciplinary physical rehabilitation program to maximize functional independence : Yes Medical management of patient stability for increased activity during participation in an intensive rehabilitation  regime.: Yes Analysis of laboratory values and/or radiology reports with any subsequent need for medication adjustment and/or medical intervention. : Yes   I attest that I was present, lead the team conference, and concur with the assessment and plan of the team.   Cristi Loron 05/15/2021, 5:07  PM

## 2021-05-15 NOTE — Progress Notes (Signed)
Occupational Therapy Session Note  Patient Details  Name: Tara Murphy MRN: [id number] Date of Birth: 11-18-1945  Today's Date: 05/15/2021 OT Individual Time: 0626-9485 OT Individual Time Calculation (min): 30 min    Short Term Goals: Week 2:  OT Short Term Goal 1 (Week 2): STG = LTGs due to remaining LOS  Skilled Therapeutic Interventions/Progress Updates:    Treatment session with focus on functional mobility, dynamic standing balance, and fine motor tasks and problem solving.  Pt received upright in recliner with daughter leaving.  Pt reports need to toilet.  Pt completed sit > stand Mod I with RW and ambulated to toilet with RW with supervision.  Pt completed toileting tasks with distant supervision.  Pt reports bathing at shower level with distant supervision/setup from nurse tech this morning.  Pt stating "I didn't need any help!".  Pt returned to sitting EOB with supervision with RW.  Engaged in 9 hole peg test (R: 29 and L: 25 seconds).  Pt completed picture replication with large colored blocks to challenge awareness and problem solving as needed for ADLs and IADLs.  Pt demonstrating improved awareness and sequencing, requiring no cues and making no errors.  Pt very pleased with progress.  Pt returned to semi-reclined and left with all needs in reach.  Therapy Documentation Precautions:  Precautions Precautions: Fall Precaution Comments: L foot drop Required Braces or Orthoses: Other Brace Other Brace: L AFO Restrictions Weight Bearing Restrictions: No  Pain: Pain Assessment Pain Scale: 0-10 Pain Score: 0-No pain   Therapy/Group: Individual Therapy  Simonne Come 05/15/2021, 2:21 PM

## 2021-05-15 NOTE — Progress Notes (Signed)
Physical Therapy Discharge Summary  Patient Details  Name: Tara Murphy MRN: [id number] Date of Birth: 1946/09/02  Patient has met 10 of 10 long term goals due to improved activity tolerance, improved balance, improved postural control, increased strength, ability to compensate for deficits, functional use of  left lower extremity, improved attention, improved awareness, and improved coordination. Patient to discharge at an ambulatory level Supervision. Patient's care partner is independent to provide the necessary physical and cognitive assistance at discharge. Pt's husband and daughter attended family education training and verbalized and demonstrated confidence with all tasks to ensure safe discharge home.   All goals met   Recommendation:  Patient will benefit from ongoing skilled PT services in outpatient setting to continue to advance safe functional mobility, address ongoing impairments in transfers, generalized strengthening, dynamic standing balance/coordination, gait training, NMR, endurance, and to minimize fall risk.  Equipment: RW  Reasons for discharge: treatment goals met  Patient/family agrees with progress made and goals achieved: Yes  PT Discharge Precautions/Restrictions Precautions Precautions: Fall Precaution Comments: L foot drop Required Braces or Orthoses: Other Brace Other Brace: L AFO Restrictions Weight Bearing Restrictions: No Pain Interference Pain Interference Pain Effect on Sleep: 2. Occasionally Pain Interference with Therapy Activities: 0. Does not apply - I have not received rehabilitationtherapy in the past 5 days Pain Interference with Day-to-Day Activities: 1. Rarely or not at all Cognition Overall Cognitive Status: Within Functional Limits for tasks assessed Arousal/Alertness: Awake/alert Orientation Level: Oriented X4 Memory: Appears intact Awareness: Appears intact Problem Solving: Appears intact Safety/Judgment: Appears  intact Sensation Sensation Light Touch: Impaired by gross assessment Proprioception: Impaired by gross assessment Additional Comments: decreased sensation LLE>RLE with reports of neuropathy Coordination Gross Motor Movements are Fluid and Coordinated: No Fine Motor Movements are Fluid and Coordinated: No Coordination and Movement Description: mild L hemiparesis, generalized weakness/deconditioning, decreaesd balance/postural control Finger Nose Finger Test: mild dysmetria bilaterally Heel Shin Test: WFL on RLE, unable to perform without compensating on LLE Motor  Motor Motor: Hemiplegia Motor - Skilled Clinical Observations: mild L hemiparesis, generalized weakness/deconditioning, decreaesd balance/postural control  Mobility Bed Mobility Bed Mobility: Rolling Right;Rolling Left;Sit to Supine;Supine to Sit Rolling Right: Supervision/verbal cueing Rolling Left: Supervision/Verbal cueing Supine to Sit: Supervision/Verbal cueing Sit to Supine: Supervision/Verbal cueing Transfers Transfers: Sit to Stand;Stand to Sit;Stand Pivot Transfers Sit to Stand: Supervision/Verbal cueing Stand to Sit: Supervision/Verbal cueing Stand Pivot Transfers: Supervision/Verbal cueing Stand Pivot Transfer Details: Verbal cues for precautions/safety Stand Pivot Transfer Details (indicate cue type and reason): occasional verbal cues for turning technique Transfer (Assistive device): Rolling walker Locomotion  Gait Ambulation: Yes Gait Assistance: Supervision/Verbal cueing Gait Distance (Feet): 150 Feet Assistive device: Rolling walker Gait Assistance Details: Verbal cues for gait pattern Gait Assistance Details: verbal cues for upright posture/gaze Gait Gait: Yes Gait Pattern: Impaired Gait Pattern: Step-through pattern;Decreased step length - left;Decreased step length - right;Decreased stance time - left;Decreased stride length;Decreased dorsiflexion - left;Decreased weight shift to left;Left  circumduction;Left hip hike;Trunk flexed;Poor foot clearance - right;Poor foot clearance - left;Narrow base of support;Decreased trunk rotation Gait velocity: Decreased Stairs / Additional Locomotion Stairs: Yes Stairs Assistance: Supervision/Verbal cueing Stair Management Technique: Two rails Number of Stairs: 12 Height of Stairs: 6 Ramp: Supervision/Verbal cueing (RW) Curb: Supervision/Verbal cueing (RW) Pick up small object from the floor assist level: Supervision/Verbal cueing Pick up small object from the floor assistive device: small cup using RW Wheelchair Mobility Wheelchair Mobility: Yes Wheelchair Assistance: Chartered loss adjuster: Both upper extremities Wheelchair Parts Management: Supervision/cueing Distance: 140f  Trunk/Postural Assessment  Cervical Assessment Cervical Assessment: Within Functional Limits Thoracic Assessment Thoracic Assessment: Exceptions to [facility] (rounded shoulders) Lumbar Assessment Lumbar Assessment: Exceptions to [facility] (posterior pelvic tilt) Postural Control Postural Control: Deficits on evaluation  Balance Balance Balance Assessed: Yes Static Sitting Balance Static Sitting - Balance Support: Feet supported;Bilateral upper extremity supported Static Sitting - Level of Assistance: 7: Independent Dynamic Sitting Balance Dynamic Sitting - Balance Support: Feet supported;No upper extremity supported Dynamic Sitting - Level of Assistance: 6: Modified independent (Device/Increase time) Static Standing Balance Static Standing - Balance Support: Bilateral upper extremity supported (RW) Static Standing - Level of Assistance: 5: Stand by assistance (supervision) Dynamic Standing Balance Dynamic Standing - Balance Support: Bilateral upper extremity supported (RW) Dynamic Standing - Level of Assistance: 5: Stand by assistance (supervision) Extremity Assessment  RLE Assessment RLE Assessment: Exceptions to [facility] General Strength  Comments: grossly generalized to 4+/5 LLE Assessment LLE Assessment: Exceptions to [facility] LLE Strength Left Hip Flexion: 3+/5 Left Hip ABduction: 3+/5 Left Hip ADduction: 3+/5 Left Knee Flexion: 3/5 Left Ankle Dorsiflexion: 1/5 Left Ankle Plantar Flexion: 3/5   Kristel Durkee M Lyn Hollingshead PT, DPT  05/15/2021, 7:50 AM

## 2021-05-15 NOTE — Progress Notes (Signed)
Physical Therapy Session Note  Patient Details  Name: Tara Murphy MRN: [id number] Date of Birth: 1946-09-04  Today's Date: 05/15/2021 PT Individual Time: [phone] and 1401-1455  PT Individual Time Calculation (min): 70 min and 54 min   Short Term Goals: Week 1:  PT Short Term Goal 1 (Week 1): Pt will consistently perform STS CGA PT Short Term Goal 1 - Progress (Week 1): Met PT Short Term Goal 2 (Week 1): Pt will ambulate 40f MinA PT Short Term Goal 2 - Progress (Week 1): Met PT Short Term Goal 3 (Week 1): Pt will complete supine<>EOB CGA PT Short Term Goal 3 - Progress (Week 1): Progressing toward goal PT Short Term Goal 4 (Week 1): Pt will complete standard height steps MinA PT Short Term Goal 4 - Progress (Week 1): Met Week 2:  PT Short Term Goal 1 (Week 2): STG=LTG due to LOS  Skilled Therapeutic Interventions/Progress Updates:   Treatment Session 1 Received pt sitting EOB donning shoes, pt agreeable to PT treatment, and denied any pain during session. Session with emphasis on discharge planning, functional mobility/transfers, generalized strengthening, dynamic standing balance/coordination, gait training, stair navigation, simulated car transfers, and improved activity tolerance. L AFO donned throughout session. Pt transferred bed<>WC stand<>pivot with RW and supervision and performed WC mobility 1530fusing BUE and supervision to elevators and transported remainder of way [facility] time management purposes. Pt performed simulated car transfer with RW and supervision with assist of UEs to lift LLE into car. Pt then ambulated 1068fn uneven surfaces (ramp and mulch) and descended 1 6in curb with RW and supervision with good awareness of RW management and overall technique. Pt able to stand and pick up small cup from floor using RW and supervision with no LOB noted. Pt then ambulated 36f20fth RW and supervision to main therapy gym and took seated rest break before  navigating 12 steps with 2 rails and close supervision alternating ascending and descending with a step to and step through pattern. Pt demonstrates good understanding of "up with the good, down with the bad" technique but motivated to increase workload on LLE since therapist was present. Pt ambulated additional 126ft29f and 117ft 55fwith RW and supervision. Pt requires cues for upright posture/gaze but demonstrates improvements in L hip IR when placing L foot onto ground. Noted pt with increased L hip circumduction with fatigue. [facility] and ambulated 15ft w82fRW and supervision to recliner. Concluded session with pt sitting in recliner, needs within reach, and chair pad alarm on. Provided pt with fresh ice water.   Treatment Session 2 Received pt supine in bed, pt agreeable to PT treatment, and denied any pain during session. Session with emphasis on functional mobility/transfers, generalized strengthening, dynamic standing balance/coordination, gait training, and improved activity tolerance. Pt transferred supine<>sitting EOB with supervision from flat bed using bedrails. Stand<>pivot bed<>WC with RW and supervision. Pt [facility] 5C [facility] for time management and energy conservation purposes. Sit<>stand with CGA and donned LiteGait harness in standing with max A. Pt stepped on/off LiteGait Treadmill with CGA and BUE support. Worked on gait training for the following time frames: - Trial 1: forward walking for 5 minutes on incline 3 at 0.4mph in27masing to 0.5mph for33m0ft with23f support fading to 1 UE support - verbal cues for upright posture/gaze, L heel strike, and to avoid "scuffing" L foot.  -  Trial 2: L side stepping for 4 minutes with BUE support fading to 1 UE support at 0.45mh for 659fwith emphasis on hip abductor strengthening - verbal cues to keep toes pointed forward and to avoid L hip hiking and visual cues to target of white line to  increase step width. Pt demonstrated difficulty with motor control/coordination when placing LLE - Trial 3: forward walking for 7 minutes at 0.54m71mfor 353f64fth BUE support fading to no UE support with emphasis on increased arm swing and trunk rotation. Pt with increased difficulty coordinating arm swing but demonstrated improvements in LLE foot clearance  Pt required seated rest breaks throughout due to fatigue. Pt ambulated 8ft 13fh RW and supervision to bed and doffed shoes, AFO, and socks independently and transferred sit<>supine with supervision using BUE's to assist LLE into bed. Concluded session with pt supine in bed, needs within reach, and bed alarm on.   Therapy Documentation Precautions:  Precautions Precautions: Fall Precaution Comments: seizure, LLE weakness/foot drop Required Braces or Orthoses: Other Brace Other Brace: L AFO Restrictions Weight Bearing Restrictions: No  Therapy/Group: Individual Therapy Sonia Bromell Alfonse AlpersDPT   05/15/2021, 7:17 AM

## 2021-05-15 NOTE — Progress Notes (Signed)
PROGRESS NOTE   Subjective/Complaints: Stable for d/c tomorrow She had a productive conversation with Dr. Sima Matas yesterday She is very thankful for her care team here.   ROS: Denies CP, SOB, N/V, +cognitive deficits  Objective:   No results found. No results for input(s): WBC, HGB, HCT, PLT in the last 72 hours. No results for input(s): NA, K, CL, CO2, GLUCOSE, BUN, CREATININE, CALCIUM in the last 72 hours.  Intake/Output Summary (Last 24 hours) at 05/15/2021 1331 Last data filed at 05/15/2021 1307 Gross per 24 hour  Intake 1075 ml  Output --  Net 1075 ml        Physical Exam: Vital Signs Blood pressure 130/61, pulse 66, temperature 98.3 F (36.8 C), temperature source Oral, resp. rate 20, height 5\' 6"  (1.676 m), weight 95.9 kg, SpO2 98 %. Gen: no distress, normal appearing HEENT: oral mucosa pink and moist, NCAT Cardio: Reg rate Chest: normal effort, normal rate of breathing Abd: soft, non-distended Extremities: No clubbing, cyanosis, or edema Skin: No evidence of breakdown, no evidence of rash Neurologic: Cranial nerves II through XII intact, motor strength is 5/5 in bilateral deltoid, bicep, tricep, grip, hip flexor, knee extensors, ankle dorsiflexor and plantar flexor No diplopia on lateral gaze Neuro:  Eyes without evidence of nystagmus  Tone is normal without evidence of spasticity Cerebellar exam shows no evidence of ataxia on finger nose finger or heel to shin testing     Sensory exam is normal to pinprick, proprioception and light touch in the upper and lower limbs   Cranial nerves II- Visual fields are intact to confrontation testing, no blurring of vision III- no evidence of ptosis, upward, downward and medial gaze intact IV- no vertical diplopia or head tilt V- no facial numbness or masseter weakness VI- no pupil abduction weakness VII- no facial droop, good lid closure VII- normal auditory  acuity    Neuro: Alert. High level cognitive deficits.    Assessment/Plan: 1. Functional deficits which require 3+ hours per day of interdisciplinary therapy in a comprehensive inpatient rehab setting. Physiatrist is providing close team supervision and 24 hour management of active medical problems listed below. Physiatrist and rehab team continue to assess barriers to discharge/monitor patient progress toward functional and medical goals  Care Tool:  Bathing    Body parts bathed by patient: Face, Right arm, Left arm, Chest, Abdomen, Front perineal area, Right upper leg, Left upper leg, Right lower leg, Left lower leg, Buttocks   Body parts bathed by helper: Buttocks     Bathing assist Assist Level: Set up assist     Upper Body Dressing/Undressing Upper body dressing   What is the patient wearing?: Pull over shirt, Bra    Upper body assist Assist Level: Supervision/Verbal cueing    Lower Body Dressing/Undressing Lower body dressing      What is the patient wearing?: Underwear/pull up, Pants     Lower body assist Assist for lower body dressing: Supervision/Verbal cueing     Toileting Toileting    Toileting assist Assist for toileting: Supervision/Verbal cueing     Transfers Chair/bed transfer  Transfers assist     Chair/bed transfer assist level: Supervision/Verbal cueing  Locomotion Ambulation   Ambulation assist      Assist level: Supervision/Verbal cueing Assistive device: Walker-rolling Max distance: >152ft   Walk 10 feet activity   Assist     Assist level: Supervision/Verbal cueing Assistive device: Walker-rolling   Walk 50 feet activity   Assist Walk 50 feet with 2 turns activity did not occur: Safety/medical concerns  Assist level: Supervision/Verbal cueing Assistive device: Walker-rolling    Walk 150 feet activity   Assist Walk 150 feet activity did not occur: Safety/medical concerns  Assist level: Supervision/Verbal  cueing Assistive device: Walker-rolling    Walk 10 feet on uneven surface  activity   Assist Walk 10 feet on uneven surfaces activity did not occur: Safety/medical concerns   Assist level: Supervision/Verbal cueing Assistive device: Walker-rolling   Wheelchair     Assist Is the patient using a wheelchair?: No Type of Wheelchair: Manual    Wheelchair assist level: Supervision/Verbal cueing Max wheelchair distance: 137ft    Wheelchair 50 feet with 2 turns activity    Assist    Wheelchair 50 feet with 2 turns activity did not occur: Safety/medical concerns   Assist Level: Supervision/Verbal cueing   Wheelchair 150 feet activity     Assist  Wheelchair 150 feet activity did not occur: Safety/medical concerns   Assist Level: Supervision/Verbal cueing   Blood pressure 130/61, pulse 66, temperature 98.3 F (36.8 C), temperature source Oral, resp. rate 20, height 5\' 6"  (1.676 m), weight 95.9 kg, SpO2 98 %.  Medical Problem List and Plan: 1.  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  Pathology reports pending Continue CIR PT OT speech -Interdisciplinary Team Conference today   2.  Antithrombotics: -DVT/anticoagulation: SCDs Mechanical: Sequential compression devices, below knee Bilateral lower extremities             -antiplatelet therapy: N/A 3. Postoperative pain: Discontinue Norco 4. Anxiety: Continue BuSpar 7.5 mg twice daily as needed ,Ativan 1 mg every 6 hours as needed anxiety             -antipsychotic agents: N/A 5. Neuropsych: This patient is capable of making decisions on her own behalf. 6. Skin/Wound Care: Routine skin checks. Staples removed 8/22.  7. Fluids/Electrolytes/Nutrition: Routine in and outs with follow-up chemistries 8.  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  Continue Neurontin 600 mg twice daily. Continue use of AFO. Discussed Qutenza outpatient. Scheduling outpatient [place] appointment for her.  9.  Seizure prophylaxis.  EEG negative.  Discontinue Keppra, completed course.  10.  Restless leg syndrome.  Continue Requip 4 mg nightly 11.  GERD.  Protonix 12.  History of left breast cancer 2015.  Follow-up outpatient 13.  Hepatic Steatosis intermittently elevated since 2018.    LFTs elevated, but improved on 8/12 14.  Steroid-induced hyperglycemia.  Hemoglobin A1c 5.5.  SSI 15. Leukocytosis:  WBCs 10.7 on 8/12 Afebrile, no signs of infection.  16. Bradycardia; monitor BP TID 17. Hypotension: monitor BP TID 18Disposition: HFU scheduled.  19.  Transient visual blurring likely fatigue with accommodation lag.  No further work-up needed at this time. LOS: 12 days A FACE TO FACE EVALUATION WAS PERFORMED  Clide Deutscher Sherene Plancarte 05/15/2021, 1:31 PM

## 2021-05-15 NOTE — Plan of Care (Signed)
  Problem: Consults Goal: RH BRAIN INJURY PATIENT EDUCATION Description: Description: See Patient Education module for eduction specifics Outcome: Progressing Goal: Skin Care Protocol Initiated - if Braden Score 18 or less Description: If consults are not indicated, leave blank or document N/A Outcome: Progressing   Problem: RH SKIN INTEGRITY Goal: RH STG ABLE TO PERFORM INCISION/WOUND CARE W/ASSISTANCE Description: STG Able To Perform Incision/Wound Care With supervision Assistance. Outcome: Progressing   Problem: RH SAFETY Goal: RH STG DECREASED RISK OF FALL WITH ASSISTANCE Description: STG Decreased Risk of Fall With supervision Assistance. Outcome: Progressing   Problem: RH COGNITION-NURSING Goal: RH STG USES MEMORY AIDS/STRATEGIES W/ASSIST TO PROBLEM SOLVE Description: STG Uses Memory Aids/Strategies With cues and reminders to Problem Solve. Outcome: Progressing Goal: RH STG ANTICIPATES NEEDS/CALLS FOR ASSIST W/ASSIST/CUES Description: STG Anticipates Needs/Calls for Assist With Cues and reminders. Outcome: Progressing   Problem: RH PAIN MANAGEMENT Goal: RH STG PAIN MANAGED AT OR BELOW PT'S PAIN GOAL Description: < 3 on a 0-10 pain scale. Outcome: Progressing

## 2021-05-15 NOTE — Progress Notes (Signed)
Speech Language Pathology Discharge Summary  Patient Details  Name: Tara Murphy MRN: [id number] Date of Birth: 05/08/1946  Today's Date: 05/15/2021 SLP Individual Time: 1030-1100 SLP Individual Time Calculation (min): 30 min   Skilled Therapeutic Interventions:  Skilled ST services focused on education and cognitive skills. Pt demonstrated great anticipatory awareness, listing methods to aid in recall utilizing technology devices (Alexa) and expressing confidence in current cognitive linguistic skills. Pt's daughter entered room.  SLP facilitated complex problem solving, alternating attention and recall in familiar deductive reasoning tasks pt demonstrated mod I. SLP provided education pertaining to higher level cognitive linguistic skills such as word finding, recall complex information, complex problem solving and higher level attention. Pt demonstrates ability to carryover strategies mod I. All questions answered to satisfaction. Pt was left in room with family, call bell within reach and chair alarm set. SLP recommends to continue skilled services.    Patient has met 4 of 4 long term goals.  Patient to discharge at overall Modified Independent level.  Reasons goals not met:     Clinical Impression/Discharge Summary:   Pt made great progress meeting 4 out 4 goals discharging mod I in cognitive linguistic skills. Pt demonstrated improvement in complex problem solving, alternating/divided attention, complex recall and word finding in conversation. Pt participated in functional tasks and education was completed with pt and pt's daughter. All questions answered to satisfaction. Pt benefited from skilled ST services in order to maximize functional independence and reduce burden of care, no f/u ST services.  Care Partner:  Caregiver Able to Provide Assistance: Yes  Type of Caregiver Assistance: Cognitive  Recommendation:  None      Equipment: N/A   Reasons for discharge: Discharged from  hospital   Patient/Family Agrees with Progress Made and Goals Achieved: Yes    Noraa Pickeral 05/15/2021, 11:14 AM

## 2021-05-16 ENCOUNTER — Other Ambulatory Visit: Payer: Self-pay | Admitting: Radiation Therapy

## 2021-05-16 LAB — GLUCOSE, CAPILLARY: Glucose-Capillary: 116 mg/dL — ABNORMAL HIGH (ref 70–99)

## 2021-05-16 NOTE — Progress Notes (Signed)
Discharge instructions by: Linna Hoff, PA   Verbalized understanding: yes              Skin care/Wound care: WDL   Pain: 0/10   IV's: none   Tubes/Drains: none   Safety instructions: explained and understood   Patient belongings: sent home with patient and family   Discharged to: private home   Discharged via: wheelchair going home in private car   Notes: All questions answered.

## 2021-05-16 NOTE — Progress Notes (Signed)
PROGRESS NOTE   Subjective/Complaints: No complaints this morning Ready for discharge today.  Bradycardia Soft BP.   ROS: Denies CP, SOB, N/V, +cognitive deficits  Objective:   No results found. No results for input(s): WBC, HGB, HCT, PLT in the last 72 hours. No results for input(s): NA, K, CL, CO2, GLUCOSE, BUN, CREATININE, CALCIUM in the last 72 hours.  Intake/Output Summary (Last 24 hours) at 05/16/2021 0949 Last data filed at 05/16/2021 0803 Gross per 24 hour  Intake 1193 ml  Output --  Net 1193 ml        Physical Exam: Vital Signs Blood pressure (!) 118/58, pulse (!) 56, temperature 98.1 F (36.7 C), temperature source Oral, resp. rate 14, height 5\' 6"  (1.676 m), weight 95.9 kg, SpO2 96 %. Gen: no distress, normal appearing HEENT: oral mucosa pink and moist, NCAT Cardio: Bradycardia Chest: normal effort, normal rate of breathing Abd: soft, non-distended Extremities: No clubbing, cyanosis, or edema Skin: No evidence of breakdown, no evidence of rash Neurologic: Cranial nerves II through XII intact, motor strength is 5/5 in bilateral deltoid, bicep, tricep, grip, hip flexor, knee extensors, ankle dorsiflexor and plantar flexor No diplopia on lateral gaze Neuro:  Eyes without evidence of nystagmus  Tone is normal without evidence of spasticity Cerebellar exam shows no evidence of ataxia on finger nose finger or heel to shin testing     Sensory exam is normal to pinprick, proprioception and light touch in the upper and lower limbs   Cranial nerves II- Visual fields are intact to confrontation testing, no blurring of vision III- no evidence of ptosis, upward, downward and medial gaze intact IV- no vertical diplopia or head tilt V- no facial numbness or masseter weakness VI- no pupil abduction weakness VII- no facial droop, good lid closure VII- normal auditory acuity    Neuro: Alert. High level  cognitive deficits.    Assessment/Plan: 1. Functional deficits which require 3+ hours per day of interdisciplinary therapy in a comprehensive inpatient rehab setting. Physiatrist is providing close team supervision and 24 hour management of active medical problems listed below. Physiatrist and rehab team continue to assess barriers to discharge/monitor patient progress toward functional and medical goals  Care Tool:  Bathing    Body parts bathed by patient: Face, Right arm, Left arm, Chest, Abdomen, Front perineal area, Right upper leg, Left upper leg, Right lower leg, Left lower leg, Buttocks   Body parts bathed by helper: Buttocks     Bathing assist Assist Level: Set up assist     Upper Body Dressing/Undressing Upper body dressing   What is the patient wearing?: Pull over shirt, Bra    Upper body assist Assist Level: Independent    Lower Body Dressing/Undressing Lower body dressing      What is the patient wearing?: Underwear/pull up, Pants     Lower body assist Assist for lower body dressing: Supervision/Verbal cueing     Toileting Toileting    Toileting assist Assist for toileting: Supervision/Verbal cueing     Transfers Chair/bed transfer  Transfers assist     Chair/bed transfer assist level: Supervision/Verbal cueing     Locomotion Ambulation   Ambulation assist  Assist level: Supervision/Verbal cueing Assistive device: Walker-rolling Max distance: >15ft   Walk 10 feet activity   Assist     Assist level: Supervision/Verbal cueing Assistive device: Walker-rolling   Walk 50 feet activity   Assist Walk 50 feet with 2 turns activity did not occur: Safety/medical concerns  Assist level: Supervision/Verbal cueing Assistive device: Walker-rolling    Walk 150 feet activity   Assist Walk 150 feet activity did not occur: Safety/medical concerns  Assist level: Supervision/Verbal cueing Assistive device: Walker-rolling    Walk  10 feet on uneven surface  activity   Assist Walk 10 feet on uneven surfaces activity did not occur: Safety/medical concerns   Assist level: Supervision/Verbal cueing Assistive device: Walker-rolling   Wheelchair     Assist Is the patient using a wheelchair?: No Type of Wheelchair: Manual    Wheelchair assist level: Supervision/Verbal cueing Max wheelchair distance: 133ft    Wheelchair 50 feet with 2 turns activity    Assist    Wheelchair 50 feet with 2 turns activity did not occur: Safety/medical concerns   Assist Level: Supervision/Verbal cueing   Wheelchair 150 feet activity     Assist  Wheelchair 150 feet activity did not occur: Safety/medical concerns   Assist Level: Supervision/Verbal cueing   Blood pressure (!) 118/58, pulse (!) 56, temperature 98.1 F (36.7 C), temperature source Oral, resp. rate 14, height 5\' 6"  (1.676 m), weight 95.9 kg, SpO2 96 %.  Medical Problem List and Plan: 1.  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  Pathology reports pending D/c today 2.  Antithrombotics: -DVT/anticoagulation: SCDs Mechanical: Sequential compression devices, below knee Bilateral lower extremities             -antiplatelet therapy: N/A 3. Postoperative pain: Discontinue Norco 4. Anxiety: Continue BuSpar 7.5 mg twice daily as needed ,Ativan 1 mg every 6 hours as needed anxiety             -antipsychotic agents: N/A 5. Neuropsych: This patient is capable of making decisions on her own behalf. 6. Skin/Wound Care: Routine skin checks. Staples removed 8/22. Healing well.  7. Fluids/Electrolytes/Nutrition: Routine in and outs with follow-up chemistries 8.  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  Continue Neurontin 600 mg twice daily- goal  to wean if responds to Salix well. Continue use of AFO. Discussed Qutenza outpatient. Scheduling outpatient [place] appointment for her.  9.  Seizure prophylaxis.  EEG negative.  Discontinue Keppra, completed course.  10.  Restless leg syndrome.  Continue Requip 4 mg nightly 11.  GERD.  Protonix 12.  History of left breast cancer 2015.  Follow-up outpatient 13.  Hepatic Steatosis intermittently elevated since 2018.    LFTs elevated, but improved on 8/12 14.  Steroid-induced hyperglycemia.  Hemoglobin A1c 5.5.  SSI 15. Leukocytosis:  WBCs 10.7 on 8/12 Afebrile, no signs of infection.  16. Bradycardia; monitor BP TID 17. Hypotension: monitor BP TID 18Disposition: HFU scheduled.  19.  Transient visual blurring likely fatigue with accommodation lag.  No further work-up needed at this time.   >30 minutes spent in discharge of patient including review of medications and follow-up appointments, physical examination, and in answering all patient's questions   LOS: 13 days A FACE TO [place] 05/16/2021, 9:49 AM

## 2021-05-16 NOTE — Progress Notes (Signed)
Inpatient Rehabilitation Care Coordinator Discharge Note   Patient Details  Name: Tara Murphy MRN: [id number] Date of Birth: [birth year]   Discharge location: Home with husband and daughter to assist  Length of Stay:  13 days  Discharge activity level: supervision level  Home/community participation: yes  Patient response ZM:CEYEMV Literacy - How often do you need to have someone help you when you read instructions, pamphlets, or other written material from your doctor or pharmacy?: Never  Patient response VK:PQAESL Isolation - How often do you feel lonely or isolated from those around you?: Rarely  Services provided included: MD, RD, PT, OT, SLP, RN, CM, TR, Pharmacy, SW  Financial Services:  Financial Services Utilized: Medicare    Choices offered to/list presented to: pt and daughter  Follow-up services arranged:  Outpatient, DME, Patient/Family has no preference for HH/DME agencies    Outpatient Servicies: [facility] [place] ot DME : [employer]-rolling walker    Patient response to transportation need: Is the patient able to respond to transportation needs?: Yes In the past 12 months, has lack of transportation kept you from medical appointments or from getting medications?: No In the past 12 months, has lack of transportation kept you from meetings, work, or from getting things needed for daily living?: No    Comments (or additional information):Pt somewhat anxious to get home and establish a routine and also with first oncology appointment regarding treatment options for her cancer  Patient/Family verbalized understanding of follow-up arrangements:  Yes  Individual responsible for coordination of the follow-up plan: terry-husband [phone]  Confirmed correct DME delivered: Elease Hashimoto 05/16/2021    Caley Ciaramitaro, Gard[place] Rhyme

## 2021-05-17 ENCOUNTER — Ambulatory Visit (HOSPITAL_BASED_OUTPATIENT_CLINIC_OR_DEPARTMENT_OTHER): Payer: Medicare Other | Admitting: Physical Therapy

## 2021-05-18 ENCOUNTER — Other Ambulatory Visit: Payer: Self-pay | Admitting: Radiation Oncology

## 2021-05-18 DIAGNOSIS — C3412 Malignant neoplasm of upper lobe, left bronchus or lung: Secondary | ICD-10-CM

## 2021-05-18 NOTE — Progress Notes (Signed)
  Radiation Oncology         [phone] ________________________________  Name: Tara Murphy MRN: [id number]  Date: 05/22/2021  DOB: Jul 14, 1946  SIMULATION AND TREATMENT PLANNING NOTE    ICD-10-CM   1. Primary cancer of left upper lobe of lung (HCC)  C34.12 NM PET Image Initial (PI) Skull Base To Thigh    2. 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  Identity was confirmed.  All relevant records and images related to the planned course of therapy were reviewed.  The patient freely provided informed written consent to proceed with treatment after reviewing the details related to the planned course of therapy. The consent form was witnessed and verified by the simulation staff. Intravenous access was established for contrast administration. Then, the patient was set-up in a stable reproducible supine position for radiation therapy.  A relocatable thermoplastic stereotactic head frame was fabricated for precise immobilization.  CT images were obtained.  Surface markings were placed.  The CT images were loaded into the planning software and fused with the patient's targeting MRI scan.  Then the target and avoidance structures were contoured.  Treatment planning then occurred.  The radiation prescription was entered and confirmed.  I have requested 3D planning  I have requested a DVH of the following structures: Brain stem, brain, left eye, right eye, lenses, optic chiasm, target volumes, uninvolved brain, and normal tissue.    SPECIAL TREATMENT PROCEDURE:  The planned course of therapy using radiation constitutes a special treatment procedure. Special care is required in the management of this patient for the following reasons. This treatment constitutes a  Special Treatment Procedure for the following reason: High dose per fraction requiring special monitoring for increased toxicities of treatment including daily imaging.  The special nature of the planned course of radiotherapy will require increased physician supervision and oversight to ensure patient's safety with optimal treatment outcomes.  This requires extended time and effort.  PLAN:  The patient will receive 27 Gy in 3 fractions.  ________________________________  Sheral Apley Tammi Klippel, M.D.

## 2021-05-21 ENCOUNTER — Encounter: Payer: Self-pay | Admitting: Radiation Oncology

## 2021-05-21 ENCOUNTER — Encounter: Payer: Self-pay | Admitting: *Deleted

## 2021-05-21 ENCOUNTER — Ambulatory Visit
Admission: RE | Admit: 2021-05-21 | Discharge: 2021-05-21 | Disposition: A | Discharge status: Home / Self Care | Payer: Medicare Other | Source: Ambulatory Visit | Attending: Radiation Oncology | Admitting: Radiation Oncology

## 2021-05-21 ENCOUNTER — Other Ambulatory Visit: Payer: Self-pay

## 2021-05-21 ENCOUNTER — Inpatient Hospital Stay: Payer: Medicare Other

## 2021-05-21 VITALS — BP 154/66 | HR 66 | Temp 97.1°F | Resp 18 | Ht 66.0 in | Wt 204.5 lb

## 2021-05-21 DIAGNOSIS — C7931 Secondary malignant neoplasm of brain: Secondary | ICD-10-CM | POA: Diagnosis not present

## 2021-05-21 DIAGNOSIS — M8580 Other specified disorders of bone density and structure, unspecified site: Secondary | ICD-10-CM | POA: Diagnosis not present

## 2021-05-21 DIAGNOSIS — Z87891 Personal history of nicotine dependence: Secondary | ICD-10-CM | POA: Insufficient documentation

## 2021-05-21 DIAGNOSIS — K219 Gastro-esophageal reflux disease without esophagitis: Secondary | ICD-10-CM | POA: Diagnosis not present

## 2021-05-21 DIAGNOSIS — E785 Hyperlipidemia, unspecified: Secondary | ICD-10-CM | POA: Diagnosis not present

## 2021-05-21 DIAGNOSIS — Z79899 Other long term (current) drug therapy: Secondary | ICD-10-CM | POA: Diagnosis not present

## 2021-05-21 DIAGNOSIS — I2510 Atherosclerotic heart disease of native coronary artery without angina pectoris: Secondary | ICD-10-CM | POA: Insufficient documentation

## 2021-05-21 DIAGNOSIS — Z87440 Personal history of urinary (tract) infections: Secondary | ICD-10-CM | POA: Diagnosis not present

## 2021-05-21 DIAGNOSIS — K589 Irritable bowel syndrome without diarrhea: Secondary | ICD-10-CM | POA: Diagnosis not present

## 2021-05-21 DIAGNOSIS — Z853 Personal history of malignant neoplasm of breast: Secondary | ICD-10-CM | POA: Diagnosis not present

## 2021-05-21 DIAGNOSIS — C3412 Malignant neoplasm of upper lobe, left bronchus or lung: Secondary | ICD-10-CM

## 2021-05-21 DIAGNOSIS — K760 Fatty (change of) liver, not elsewhere classified: Secondary | ICD-10-CM | POA: Diagnosis not present

## 2021-05-21 DIAGNOSIS — Z86010 Personal history of colonic polyps: Secondary | ICD-10-CM | POA: Diagnosis not present

## 2021-05-21 DIAGNOSIS — G629 Polyneuropathy, unspecified: Secondary | ICD-10-CM | POA: Diagnosis not present

## 2021-05-21 NOTE — Progress Notes (Signed)
[facility] CONSULT NOTE  Patient Care Team: Leamon Arnt, MD as PCP - General (Family Medicine) Loletha Carrow, Kirke Corin, MD as Consulting Physician (Gastroenterology) Ulla Gallo, MD as Consulting Physician (Dermatology) Wylene Simmer, MD as Consulting Physician (Orthopedic Surgery) Alda Berthold, DO as Consulting Physician (Neurology)  CHIEF COMPLAINTS/PURPOSE OF CONSULTATION:  Adenocarcinoma lung  ASSESSMENT & PLAN:   This is a very pleasant 75 year old female patient with past medical history significant for left breast IDC grade 1, intermediate grade DCIS, negative margins, 0 out of 2 sentinel lymph nodes, pathologic staging T1CN0 status post surgery, ER/PR positive but could not tolerate antiestrogen therapy because of severe emotional changes who presented with seizure-like activity and left upper and left lower extremity weakness to the hospital.  She had imaging which showed 2 x 2 centimeter hemorrhagic posterior right frontal lobe mass and associated vasogenic edema.  She also had systemic imaging showing large spiculated left upper lobe suprahilar mass most consistent with primary bronchogenic malignancy as well as an indeterminate 8 mm right upper lobe pulmonary nodule.  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 She has met Dr. Tammi Klippel for consideration of adjuvant radiation after surgery.  She will be presented in the lung tumor board however I agree with outpatient PET/CT although I wonder if the 8 mm right lung nodule may not appear active on the PET given its size under 1 cm.  We have initially discussed about considering neoadjuvant targeted therapy/immunotherapy/chemotherapy based on foundation 1 testing and reevaluating in 3 months with repeat imaging.  If she has no evidence of distant disease on PET CT, we can also consider  upfront surgical resection and monitor the RUL nodule with serial imaging PET scheduled on 9/9, will discuss final plan after PET imaging She will also be presented at lung TB.  If she however needs systemic therapy upfront, she will be an excellent candidate for single agent immunotherapy We today discussed mechanism of action of immunotherapy, adverse effects of immunotherapy including but not limited to fatigue, dry skin, hypo/hyper thyroidism, hepatitis, nephritis, hypophysitis, myocarditis.  She understands that immunotherapy can virtually affect any organ but severe adverse effects are really uncommon with immunotherapy ranging about 2 to 5%.  She will return to clinic after PET/CT for further discussion.  HISTORY OF PRESENTING ILLNESS:  Tara Murphy 75 y.o. female is here because of new diagnosis of adenocarcinoma of the lung.  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  She apparently started having some uncoordinated uncontrolled jerking movements of the left lower and left upper extremity witnessed by her husband. CT head shows a brain tumor 2 x 2 centimeter hemorrhagic posterior right frontal lobe mass and associated vasogenic edema. Also had CT chest abdomen pelvis which showed large spiculated left upper lobe suprahilar mass most consistent with primary bronchogenic malignancy.  Indeterminate 8 mm right upper lobe pulmonary nodule metastatic disease cannot be excluded, attention on follow-up  recommended.  Medical oncology  was consulted for additional recommendations. MR brain showed hemorrhagic lesion of the paramedian superior right frontal lobe with moderate surrounding vasogenic edema. We discussed that resection of the brain tumor will address the current clinical symptoms as well as establish the diagnosis.  She proceeded with bilateral parietal craniotomy with resection of brain tumor by Dr. Trenton Gammon on 04/30/2021.  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 Foundation 1 testing showed a TPS score of 90% and no other targetable mutations. She was seen by Dr. Tammi Klippel from radiation oncology for consideration of postop SRS versus whole brain radiation.  Since last visit, her left lower extremity weakness continues to improve.  She is working with rehab.  She denies any other new complaints today.  She has a PET/CT scheduled for September 9.  Rest of the pertinent 10 point ROS reviewed and negative.  REVIEW OF SYSTEMS:   Constitutional: Denies fevers, chills or abnormal night sweats Eyes: Denies blurriness of vision, double vision or watery eyes Ears, nose, mouth, throat, and face: Denies mucositis or sore throat Respiratory: Denies cough, dyspnea or wheezes Cardiovascular: Denies palpitation, chest discomfort or lower extremity swelling Gastrointestinal:  Denies nausea, heartburn or change in bowel habits Skin: Denies abnormal skin rashes Lymphatics: Denies new lymphadenopathy or easy bruising Neurological:Denies numbness, tingling or new weaknesses Behavioral/Psych: Mood is stable, no new changes  All other systems were reviewed with the patient and are negative.  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[place])    S/p total hip replacement left   Breast cancer ([place]) 2015   left    Cataract     Cholecystolithiasis    Chronic allergic rhinitis    Depression    Former smoker, stopped smoking in distant past 10/05/2019   Quit 2000; 57 y smoking history   GERD (gastroesophageal reflux disease)    Hepatic steatosis 06/08/2020   Intermittent elevated LFTs and ultrasound 2018   Hyperlipemia    IBS (irritable bowel syndrome) 10/08/2018   Nl colonoscopy and EGD 09/2018   IBS (irritable bowel syndrome)    Lichen plano-pilaris    Major depression, 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 Repeat 2020   Urinary tract infection    hx of frequent     SURGICAL HISTORY: Past Surgical History:  Procedure Laterality Date   APPENDECTOMY     APPLICATION OF CRANIAL NAVIGATION Right 04/30/2021   Procedure: APPLICATION OF CRANIAL NAVIGATION;  Surgeon: Earnie Larsson, MD;  Location: [facility];  Service: Neurosurgery;  Laterality: Right;   BREAST LUMPECTOMY Left 2015   cataracts     CHOLECYSTECTOMY N/A 10/29/2016   Procedure: LAPAROSCOPIC CHOLECYSTECTOMY;  Surgeon: Erroll Luna, MD;  Location: [facility];  Service: General;  Laterality: N/A;   COLONOSCOPY     CRANIOTOMY Right 04/30/2021   Procedure: Right Parietal Craniotomy for tumor with Brain Lab;  Surgeon: Earnie Larsson, MD;  Location: [facility];  Service: Neurosurgery;  Laterality: Right;   LIPOSUCTION     TOTAL HIP ARTHROPLASTY     bilateral hip arthoplasty   TOTAL HIP ARTHROPLASTY  Right 12/18/2015   Procedure: RIGHT TOTAL HIP ARTHROPLASTY ANTERIOR APPROACH;  Surgeon: Gaynelle Arabian, MD;  Location: [facility];  Service: Orthopedics;  Laterality: Right;   TOTAL KNEE ARTHROPLASTY     TOTAL KNEE ARTHROPLASTY Left 11/25/2016   Procedure: LEFT TOTAL KNEE ARTHROPLASTY;  Surgeon: Gaynelle Arabian,  MD;  Location: [facility];  Service: Orthopedics;  Laterality: Left;  with abductor block   TUBAL LIGATION     UPPER GASTROINTESTINAL ENDOSCOPY      SOCIAL HISTORY: Social History   Socioeconomic History   Marital status: Married    Spouse name: Not on file   Number of children: 2   Years of education: Not on file   Highest education level: Not on file  Occupational History   Occupation: Retired     Comment: Oceanographer   Tobacco Use   Smoking status: Former    Packs/day: 1.00    Types: Cigarettes    Quit date: 11/22/1991    Years since quitting: 29.5   Smokeless tobacco: Never  Vaping Use   Vaping Use: Never used  Substance and Sexual Activity   Alcohol use: Yes    Alcohol/week: 14.0 standard drinks    Types: 14 Glasses of wine per week   Drug use: No   Sexual activity: Not Currently    Birth control/protection: Post-menopausal  Other Topics Concern   Not on file  Social History Narrative   4 grandchildren and 3 step    Enjoys painting, and gardening, and home decorating    Right Handed   Has a [place] Puppy    Lives one story home    Social Determinants of Health   Financial Resource Strain: Not on file  Food Insecurity: Not on file  Transportation Needs: Not on file  Physical Activity: Not on file  Stress: Not on file  Social Connections: Not on file  Intimate Partner Violence: Not on file    FAMILY HISTORY: Family History  Problem Relation Age of Onset   Arthritis Mother    Diabetes Mother    Crohn's disease Mother    Arthritis Father    Lung cancer Father    Breast cancer Sister 84   Hyperlipidemia Maternal Aunt    Stomach cancer Maternal Grandmother    Colitis Neg Hx    Rectal cancer Neg Hx    Esophageal cancer Neg Hx     ALLERGIES:  is allergic to fluorescein, contrast media [iodinated diagnostic agents], hydromorphone hcl, other, sulfamethoxazole-trimethoprim, fentanyl, tape, and terbinafine and related.  MEDICATIONS:  Current Outpatient  Medications  Medication Sig Dispense Refill   busPIRone (BUSPAR) 7.5 MG tablet Take 1 tablet (7.5 mg total) by mouth 2 (two) times daily as needed (anxiety). 30 tablet 0   dexamethasone (DECADRON) 2 MG tablet Take 1 tablet (2 mg total) by mouth every 12 (twelve) hours. 21 tablet 0   gabapentin (NEURONTIN) 300 MG capsule Take 2 capsules (600 mg total) by mouth 2 (two) times daily. 180 capsule 0   polyethylene glycol (MIRALAX / GLYCOLAX) 17 g packet Take 17 g by mouth daily as needed for mild constipation. (Patient not taking: Reported on 05/21/2021) 14 each 0   rOPINIRole (REQUIP) 4 MG tablet Take 1 tablet (4 mg total) by mouth at bedtime. 30 tablet 0   No current facility-administered medications for this visit.     PHYSICAL EXAMINATION: ECOG PERFORMANCE STATUS: 2 - Symptomatic, <50% confined to bed  Vitals:   05/22/21 1503  BP: 136/60  Pulse: 74  Resp: 18  Temp: 98 F (36.7 C)  SpO2: 96%   Filed Weights   05/22/21 1503  Weight: 207 lb 12.8 oz (94.3 kg)    Focused exam, she appears to be in no acute distress.  Left lower extremity weakness improved.  LABORATORY DATA:  I have reviewed the data as listed Lab Results  Component Value Date   WBC 10.7 (H) 05/04/2021   HGB 12.9 05/04/2021   HCT 39.4 05/04/2021   MCV 92.5 05/04/2021   PLT 308 05/04/2021     Chemistry      Component Value Date/Time   NA 137 05/04/2021 0543   K 4.0 05/04/2021 0543   CL 101 05/04/2021 0543   CO2 25 05/04/2021 0543   BUN 14 05/04/2021 0543   CREATININE 0.52 05/04/2021 0543   CREATININE 0.69 06/07/2020 1410      Component Value Date/Time   CALCIUM 9.0 05/04/2021 0543   ALKPHOS 81 05/04/2021 0543   AST 34 05/04/2021 0543   ALT 66 (H) 05/04/2021 0543   BILITOT 0.4 05/04/2021 0543       RADIOGRAPHIC STUDIES: I have personally reviewed the radiological images as listed and agreed with the findings in the report. CT HEAD WO CONTRAST (5MM)  Result Date: 04/24/2021 CLINICAL DATA:   Seizure. EXAM: CT HEAD WITHOUT CONTRAST TECHNIQUE: Contiguous axial images were obtained from the base of the skull through the vertex without intravenous contrast. COMPARISON:  None. FINDINGS: Brain: There is mild cerebral atrophy with widening of the extra-axial spaces and ventricular dilatation. There are areas of decreased attenuation within the white matter tracts of the supratentorial brain, consistent with microvascular disease changes. A 2.2 cm x 1.9 cm x 2.2 cm well-defined low-attenuation white matter mass (approximately 22.91 Hounsfield units) is seen within the parasagittal posterior right frontal lobe, along the vertex (axial CT images 25 through 29, CT series 3). This contains a small hemorrhagic component which measures approximately 1.2 cm x 1.0 cm x 0.6 cm). A moderate to marked amount of surrounding white matter vasogenic edema is seen. Mass effect is seen on the adjacent sulci without evidence of midline shift. Vascular: No hyperdense vessel or unexpected calcification. Skull: Normal. Negative for fracture or focal lesion. Sinuses/Orbits: No acute finding. Other: None. IMPRESSION: 1. Hemorrhagic posterior right frontal lobe mass and associated vasogenic edema, as described above. MRI correlation is recommended. Electronically Signed   By: Virgina Norfolk M.D.   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 EXAM: MRI HEAD WITHOUT AND WITH CONTRAST TECHNIQUE: Multiplanar, multiecho pulse sequences of the brain and surrounding structures were obtained without and with intravenous contrast. CONTRAST:  71m GADAVIST GADOBUTROL 1 MMOL/ML IV SOLN COMPARISON:  04/28/2021.  04/24/2021. FINDINGS: Brain: Interval right frontoparietal vertex craniotomy for resection of a cystic hemorrhagic metastasis at the vertex. Gross total resection of the mass. Blood products within the post resection space,  which measures 16 mm, compared with the lesion size of 25 mm. Enhancement along the margins of the resection is not unexpected and will need to be followed for evidence of regression or progression. Regional vasogenic edema appears similar. No new brain lesion is seen. No evidence of perioperative infarction. No hydrocephalus. No extra-axial collection. Vascular: Major vessels at the base of the brain show flow. Superior sagittal sinus remains patent. Skull and upper cervical spine: Otherwise negative Sinuses/Orbits: Clear/normal Other: None IMPRESSION: Gross total resection of  the hemorrhagic cystic metastasis at the right brain vertex. No operative complication is discernible. Regional edema appears similar. Linear enhancement along the margins of the post resection space will need to be followed for evidence regression or progression. Electronically Signed   By: Nelson Chimes M.D.   On: 05/01/2021 07:46   MR BRAIN W WO CONTRAST  Result Date: 04/28/2021 CLINICAL DATA:  Left lower extremity weakness. Breast carcinoma. Brain lab protocol. EXAM: MRI HEAD WITHOUT AND WITH CONTRAST TECHNIQUE: Multiplanar, multiecho pulse sequences of the brain and surrounding structures were obtained without and with intravenous contrast. CONTRAST:  9.71m GADAVIST GADOBUTROL 1 MMOL/ML IV SOLN COMPARISON:  Brain MRI 04/24/2021 FINDINGS: Brain: Unchanged appearance of hemorrhagic lesion in the paramedian right frontal lobe with surrounding edema. Blood fluid level within the lesion. No midline shift. Vascular: Major flow voids are preserved. Skull and upper cervical spine: Normal calvarium and skull base. Visualized upper cervical spine and soft tissues are normal. Sinuses/Orbits:No paranasal sinus fluid levels or advanced mucosal thickening. No mastoid or middle ear effusion. Normal orbits. IMPRESSION: BrainLAB protocol showing unchanged appearance of hemorrhagic lesion in the paramedian right frontal lobe with surrounding edema.  Electronically Signed   By: KUlyses JarredM.D.   On: 04/28/2021 20:09   MR Brain W and Wo Contrast  Result Date: 04/25/2021 CLINICAL DATA:  Cancer of unknown primary, staging EXAM: MRI HEAD WITHOUT AND WITH CONTRAST TECHNIQUE: Multiplanar, multiecho pulse sequences of the brain and surrounding structures were obtained without and with intravenous contrast. CONTRAST:  176mGADAVIST GADOBUTROL 1 MMOL/ML IV SOLN COMPARISON:  None. FINDINGS: Brain: Hemorrhagic lesion of the paramedian superior right frontal lobe measures 2.6 x 1.6 cm. There is moderate surrounding vasogenic edema. There is a hematocrit level within the lesion. Normal white matter signal, parenchymal volume and CSF spaces. The midline structures are normal. Contrast enhancement of the above described lesion is predominantly peripheral. There are no other contrast-enhancing lesions. Vascular: Major flow voids are preserved. Skull and upper cervical spine: Normal calvarium and skull base. Visualized upper cervical spine and soft tissues are normal. Sinuses/Orbits:No paranasal sinus fluid levels or advanced mucosal thickening. No mastoid or middle ear effusion. Normal orbits. IMPRESSION: Hemorrhagic lesion of the paramedian superior right frontal lobe with moderate surrounding vasogenic edema. This is most consistent with a solitary metastasis. Electronically Signed   By: KeUlyses Jarred.D.   On: 04/25/2021 00:15   CT CHEST ABDOMEN PELVIS W CONTRAST  Result Date: 04/25/2021 CLINICAL DATA:  Cancer of unknown primary, staging, history of left breast cancer in 2015, intracranial mass EXAM: CT CHEST, ABDOMEN, AND PELVIS WITH CONTRAST TECHNIQUE: Multidetector CT imaging of the chest, abdomen and pelvis was performed following the standard protocol during bolus administration of intravenous contrast. CONTRAST:  10038mMNIPAQUE IOHEXOL 300 MG/ML  SOLN COMPARISON:  None. FINDINGS: CT CHEST FINDINGS Cardiovascular: The heart and great vessels are unremarkable  without pericardial effusion. Normal caliber of the thoracic aorta. Prominent atherosclerosis of the coronary vasculature. Mediastinum/Nodes: No pathologic mediastinal, hilar, or axillary adenopathy. Thyroid, trachea, and esophagus are unremarkable. Lungs/Pleura: There is a left upper lobe spiculated suprahilar mass measuring 3.5 x 2.0 by 4.3 cm, consistent with primary bronchogenic neoplasm. There is an indeterminate 8 mm right upper lobe pulmonary nodule image 57/5, concerning for metastatic disease. No other acute airspace disease, effusion, or pneumothorax. Central airways are patent. Musculoskeletal: There are no acute or destructive bony lesions. Reconstructed images demonstrate no additional findings. CT ABDOMEN PELVIS FINDINGS Hepatobiliary: Gallbladder is surgically absent. No focal  liver abnormalities. No biliary duct dilation. Pancreas: Unremarkable. No pancreatic ductal dilatation or surrounding inflammatory changes. Spleen: Normal in size without focal abnormality. Adrenals/Urinary Tract: Adrenal glands are unremarkable. Kidneys are normal, without renal calculi, focal lesion, or hydronephrosis. Evaluation of the bladder is limited due to streak artifact from bilateral hip arthroplasties. No gross abnormality. Stomach/Bowel: No bowel obstruction or ileus. Scattered diverticulosis of the distal colon without diverticulitis. No bowel wall thickening or inflammatory change. Vascular/Lymphatic: Aortic atherosclerosis. No enlarged abdominal or pelvic lymph nodes. Reproductive: Coarse uterine calcifications likely represent degenerating fibroids. No adnexal masses. Other: No free fluid or free gas.  No abdominal wall hernia. Musculoskeletal: Unremarkable bilateral hip arthroplasties. No acute or destructive bony lesions. Reconstructed images demonstrate no additional findings. IMPRESSION: 1. Large spiculated left upper lobe suprahilar mass most consistent with primary bronchogenic malignancy. This may be  amenable to endobronchial sampling given proximity to the left upper bronchus. 2. Indeterminate 8 mm right upper lobe pulmonary nodule. Metastatic disease cannot be excluded. Attention on follow-up recommended. 3. No acute intra-abdominal or intrapelvic process. No evidence of subdiaphragmatic metastases. 4. Sigmoid diverticulosis without diverticulitis. 5. Aortic Atherosclerosis (ICD10-I70.0). Coronary artery atherosclerosis. Electronically Signed   By: Randa Ngo M.D.   On: 04/25/2021 19:18   EEG adult  Result Date: 04/25/2021 Lora Havens, MD     04/25/2021 12:46 PM Patient Name: Kassiah Mccrory MRN: [id number] Epilepsy Attending: Lora Havens Referring Physician/Provider: Dr Irene Pap Date: 04/25/2021 Duration: 22.33 mins Patient history:  75 year old female with new onset seizure with metastatic brain lesion. EEG to evaluate for seizure Level of alertness: Awake AEDs during EEG study: LEV, GBP Technical aspects: This EEG study was done with scalp electrodes positioned according to the 10-20 International system of electrode placement. Electrical activity was acquired at a sampling rate of 500Hz  and reviewed with a high frequency filter of 70Hz  and a low frequency filter of 1Hz . EEG data were recorded continuously and digitally stored. Description: The posterior dominant rhythm consists of 9 Hz activity of moderate voltage (25-35 uV) seen predominantly in posterior head regions, symmetric and reactive to eye opening and eye closing. Hyperventilation and photic stimulation were not performed.   IMPRESSION: This study is within normal limits. No seizures or epileptiform discharges were seen throughout the recording. Priyanka Barbra Sarks   Overnight EEG with video  Result Date: 04/27/2021 Lora Havens, MD     04/28/2021  6:56 AM Patient Name: Tailynn Armetta MRN: [id number] Epilepsy Attending: Lora Havens Referring Physician/Provider: Dr Donnetta Simpers Duration: 04/26/2021 1206 to 04/27/2021 1206   Patient history:  75 year old female with new onset seizure with metastatic brain lesion. EEG to evaluate for seizure  Level of alertness: Awake, asleep  AEDs during EEG study: LEV, GBP  Technical aspects: This EEG study was done with scalp electrodes positioned according to the 10-20 International system of electrode placement. Electrical activity was acquired at a sampling rate of 500Hz  and reviewed with a high frequency filter of 70Hz  and a low frequency filter of 1Hz . EEG data were recorded continuously and digitally stored.  Description: The posterior dominant rhythm consists of 8-9 Hz activity of moderate voltage (25-35 uV) seen predominantly in posterior head regions, symmetric and reactive to eye opening and eye closing. Sleep was characterized by vertex waves, sleep spindles (12 to 14 Hz), maximal frontocentral region.  Hyperventilation and photic stimulation were not performed.    IMPRESSION: This study is within normal limits. No seizures or epileptiform discharges were seen throughout the recording.  Lora Havens    I have reviewed all pertinent imaging and pathology reports.  All questions were answered. The patient knows to call the clinic with any problems, questions or concerns. I spent 45 minutes in the care of this patient including H and P, review of records, counseling and coordination of care. I have reviewed pertinent records, discussed foundation 1 results, possible options, role of PET/CT, reviewed imaging with patient and her husband today.  Discussed plan of care with Dr. Tammi Klippel as well as our nurse navigator team.   Benay Pike, MD 05/22/2021 4:06 PM

## 2021-05-21 NOTE — Progress Notes (Signed)
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    ICD-10-CM   1. Metastatic adenocarcinoma to brain ([place])  C79.31     2. Primary cancer of left upper lobe of lung ([place])  C34.12       HISTORY OF PRESENT ILLNESS::Tara Murphy is a 75 y.o. female who presented to the ED on 04/24/21 with left hemiparesis.  Head CT without contrast revealed a 2.2 cm hemorrhagic posterior right frontal brain metastasis:   MRI with and without contrast confirmed a 2.6 cm hemorrhagic lesion with surrounding edema.    Suspecting the brain lesion to be a metastasis, CT Chest/Abd/Pelvis on 04/25/21 revealed a 4.3 cm spiculated left upper lung lesion and a an 8 mm right upper lung pulmonary nodule.    She underwent bilateral parietal craniotomy with resection of the brain metastasis.  Pathology showed adenocarcinoma consistent with primary lung cancer.  Molecular testing shows the tumor to be 90% PD-L1 positive.  She has been kindly referred today to discuss potential radiation treatment options.  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+, PR+, HER-2-)  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[place])    S/p total hip replacement left   Breast cancer ([place]) 2015   left    Cataract    Cholecystolithiasis    Chronic allergic rhinitis    Depression     Former smoker, stopped smoking in distant past 10/05/2019   Quit 2000; 54 y smoking history   GERD (gastroesophageal reflux disease)    Hepatic steatosis 06/08/2020   Intermittent elevated LFTs and ultrasound 2018   Hyperlipemia    IBS (irritable bowel syndrome) 10/08/2018   Nl colonoscopy and EGD 09/2018   IBS (irritable bowel syndrome)    Lichen plano-pilaris    Major depression, 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 Repeat 2020   Urinary tract infection    hx of frequent   :   Past Surgical History:  Procedure Laterality Date   APPENDECTOMY     APPLICATION OF CRANIAL NAVIGATION Right 04/30/2021   Procedure: APPLICATION OF CRANIAL NAVIGATION;  Surgeon: Earnie Larsson, MD;  Location: [place];  Service: Neurosurgery;  Laterality: Right;   BREAST LUMPECTOMY Left 2015   cataracts     CHOLECYSTECTOMY N/A 10/29/2016   Procedure: LAPAROSCOPIC CHOLECYSTECTOMY;  Surgeon: Erroll Luna, MD;  Location: [place];  Service: General;  Laterality: N/A;   COLONOSCOPY     CRANIOTOMY Right 04/30/2021   Procedure: Right Parietal Craniotomy for tumor with Brain Lab;  Surgeon: Earnie Larsson, MD;  Location: [place];  Service: Neurosurgery;  Laterality: Right;  LIPOSUCTION     TOTAL HIP ARTHROPLASTY     bilateral hip arthoplasty   TOTAL HIP ARTHROPLASTY Right 12/18/2015   Procedure: RIGHT TOTAL HIP ARTHROPLASTY ANTERIOR APPROACH;  Surgeon: Gaynelle Arabian, MD;  Location: [facility];  Service: Orthopedics;  Laterality: Right;   TOTAL KNEE ARTHROPLASTY     TOTAL KNEE ARTHROPLASTY Left 11/25/2016   Procedure: LEFT TOTAL KNEE ARTHROPLASTY;  Surgeon: Gaynelle Arabian, MD;  Location: [facility];  Service: Orthopedics;  Laterality: Left;  with abductor block    TUBAL LIGATION     UPPER GASTROINTESTINAL ENDOSCOPY    :   Current Outpatient Medications:    busPIRone (BUSPAR) 7.5 MG tablet, Take 1 tablet (7.5 mg total) by mouth 2 (two) times daily as needed (anxiety)., Disp: 30 tablet, Rfl: 0   dexamethasone (DECADRON) 2 MG tablet, Take 1 tablet (2 mg total) by mouth every 12 (twelve) hours., Disp: 21 tablet, Rfl: 0   gabapentin (NEURONTIN) 300 MG capsule, Take 2 capsules (600 mg total) by mouth 2 (two) times daily., Disp: 180 capsule, Rfl: 0   rOPINIRole (REQUIP) 4 MG tablet, Take 1 tablet (4 mg total) by mouth at bedtime., Disp: 30 tablet, Rfl: 0   polyethylene glycol (MIRALAX / GLYCOLAX) 17 g packet, Take 17 g by mouth daily as needed for mild constipation. (Patient not taking: Reported on 05/21/2021), Disp: 14 each, Rfl: 0:   Allergies  Allergen Reactions   Fluorescein Itching and Other (See Comments)   Contrast Media [Iodinated Diagnostic Agents] Hives   Hydromorphone Hcl Nausea Only   Other Rash    Certain Band-Aids don't agree with the patient's skin; hospital ID wristband causes rashes   Sulfamethoxazole-Trimethoprim Hives   Fentanyl Other (See Comments)    Redness and flushing over body    Tape Rash    Certain Band-Aids don't agree with the patient's skin; hospital ID wristband causes rashes   Terbinafine And Related Hives and Rash  :   Family History  Problem Relation Age of Onset   Arthritis Mother    Diabetes Mother    Crohn's disease Mother    Arthritis Father    Lung cancer Father    Breast cancer Sister 10   Hyperlipidemia Maternal Aunt    Stomach cancer Maternal Grandmother    Colitis Neg Hx    Rectal cancer Neg Hx    Esophageal cancer Neg Hx   :   Social History   Socioeconomic History   Marital status: Married    Spouse name: Not on file   Number of children: 2   Years of education: Not on file   Highest education level: Not on file  Occupational History   Occupation: Retired     Comment: Copy   Tobacco Use   Smoking status: Former    Packs/day: 1.00    Types: Cigarettes    Quit date: 11/22/1991    Years since quitting: 29.5   Smokeless tobacco: Never  Vaping Use   Vaping Use: Never used  Substance and Sexual Activity   Alcohol use: Yes    Alcohol/week: 14.0 standard drinks    Types: 14 Glasses of wine per week   Drug use: No   Sexual activity: Not Currently    Birth control/protection: Post-menopausal  Other Topics Concern   Not on file  Social History Narrative   4 grandchildren and 3 step    Enjoys painting, and gardening, and home decorating    Right Handed   Has a [place]  Puppy    Lives one story home    Social Determinants of Health   Financial Resource Strain: Not on file  Food Insecurity: Not on file  Transportation Needs: Not on file  Physical Activity: Not on file  Stress: Not on file  Social Connections: Not on file  Intimate Partner Violence: Not on file  :  REVIEW OF SYSTEMS:  A 15 point review of systems is documented in the electronic medical record. This was obtained by the nursing staff. However, I reviewed this with the patient to discuss relevant findings and make appropriate changes.  Pertinent items are noted in HPI.   PHYSICAL EXAM:  Blood pressure (!) 154/66, pulse 66, temperature (!) 97.1 F (36.2 C), temperature source Temporal, resp. rate 18, height _0  (1.676 m), weight 204 lb 8 oz (92.8 kg), SpO2 98 %. Per Neurosurgery on 8/3 pre-op: Patient is awake and alert.  She is oriented and appropriate.  Her speech is fluent.  Her judgment insight are intact.  Cranial nerve function is normal bilaterally.  Motor examination extremities reveals some slight weakness with a pronator drift in her left upper extremity.  She has 3/5 weakness in her left lower extremity with some spasticity and some involuntary movements.  Her right lower extremity strength is normal.  Toes are upgoing on the left.  Reflexes are difficult to ascertain on the  left secondary to some spasms.  Examination head ears eyes nose throat is unremarkable.  Chest and abdomen benign.  Extremities are free from injury deformity. Post-op today, her left strength is 5-/5  KPS = 90  100 - Normal; no complaints; no evidence of disease. 90   - Able to carry on normal activity; minor signs or symptoms of disease. 80   - Normal activity with effort; some signs or symptoms of disease. 31   - Cares for self; unable to carry on normal activity or to do active work. 60   - Requires occasional assistance, but is able to care for most of his personal needs. 50   - Requires considerable assistance and frequent medical care. 38   - Disabled; requires special care and assistance. 12   - Severely disabled; hospital admission is indicated although death not imminent. 82   - Very sick; hospital admission necessary; active supportive treatment necessary. 10   - Moribund; fatal processes progressing rapidly. 0     - Dead  Karnofsky DA, Abelmann [place], Craver LS and Burchenal JH [phone]) The use of the nitrogen mustards in the palliative treatment of carcinoma: with particular reference to bronchogenic carcinoma Cancer 1 [id number]  LABORATORY DATA:  Lab Results  Component Value Date   WBC 10.7 (H) 05/04/2021   HGB 12.9 05/04/2021   HCT 39.4 05/04/2021   MCV 92.5 05/04/2021   PLT 308 05/04/2021   Lab Results  Component Value Date   NA 137 05/04/2021   K 4.0 05/04/2021   CL 101 05/04/2021   CO2 25 05/04/2021   Lab Results  Component Value Date   ALT 66 (H) 05/04/2021   AST 34 05/04/2021   ALKPHOS 81 05/04/2021   BILITOT 0.4 05/04/2021     RADIOGRAPHY: CT HEAD WO CONTRAST (5MM)  Result Date: 04/24/2021 CLINICAL DATA:  Seizure. EXAM: CT HEAD WITHOUT CONTRAST TECHNIQUE: Contiguous axial images were obtained from the base of the skull through the vertex without intravenous contrast. COMPARISON:  None. FINDINGS: Brain: There is mild cerebral atrophy with widening of the  extra-axial spaces and ventricular  dilatation. There are areas of decreased attenuation within the white matter tracts of the supratentorial brain, consistent with microvascular disease changes. A 2.2 cm x 1.9 cm x 2.2 cm well-defined low-attenuation white matter mass (approximately 22.91 Hounsfield units) is seen within the parasagittal posterior right frontal lobe, along the vertex (axial CT images 25 through 29, CT series 3). This contains a small hemorrhagic component which measures approximately 1.2 cm x 1.0 cm x 0.6 cm). A moderate to marked amount of surrounding white matter vasogenic edema is seen. Mass effect is seen on the adjacent sulci without evidence of midline shift. Vascular: No hyperdense vessel or unexpected calcification. Skull: Normal. Negative for fracture or focal lesion. Sinuses/Orbits: No acute finding. Other: None. IMPRESSION: 1. Hemorrhagic posterior right frontal lobe mass and associated vasogenic edema, as described above. MRI correlation is recommended. Electronically Signed   By: Virgina Norfolk M.D.   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 EXAM: MRI HEAD WITHOUT AND WITH CONTRAST TECHNIQUE: Multiplanar, multiecho pulse sequences of the brain and surrounding structures were obtained without and with intravenous contrast. CONTRAST:  93m GADAVIST GADOBUTROL 1 MMOL/ML IV SOLN COMPARISON:  04/28/2021.  04/24/2021. FINDINGS: Brain: Interval right frontoparietal vertex craniotomy for resection of a cystic hemorrhagic metastasis at the vertex. Gross total resection of the mass. Blood products within the post resection space, which measures 16 mm, compared with the lesion size of 25 mm. Enhancement along the margins of the resection is not unexpected and will need to be followed for evidence of regression or progression. Regional vasogenic edema appears similar. No new brain  lesion is seen. No evidence of perioperative infarction. No hydrocephalus. No extra-axial collection. Vascular: Major vessels at the base of the brain show flow. Superior sagittal sinus remains patent. Skull and upper cervical spine: Otherwise negative Sinuses/Orbits: Clear/normal Other: None IMPRESSION: Gross total resection of the hemorrhagic cystic metastasis at the right brain vertex. No operative complication is discernible. Regional edema appears similar. Linear enhancement along the margins of the post resection space will need to be followed for evidence regression or progression. Electronically Signed   By: MNelson ChimesM.D.   On: 05/01/2021 07:46   MR BRAIN W WO CONTRAST  Result Date: 04/28/2021 CLINICAL DATA:  Left lower extremity weakness. Breast carcinoma. Brain lab protocol. EXAM: MRI HEAD WITHOUT AND WITH CONTRAST TECHNIQUE: Multiplanar, multiecho pulse sequences of the brain and surrounding structures were obtained without and with intravenous contrast. CONTRAST:  9.479mGADAVIST GADOBUTROL 1 MMOL/ML IV SOLN COMPARISON:  Brain MRI 04/24/2021 FINDINGS: Brain: Unchanged appearance of hemorrhagic lesion in the paramedian right frontal lobe with surrounding edema. Blood fluid level within the lesion. No midline shift. Vascular: Major flow voids are preserved. Skull and upper cervical spine: Normal calvarium and skull base. Visualized upper cervical spine and soft tissues are normal. Sinuses/Orbits:No paranasal sinus fluid levels or advanced mucosal thickening. No mastoid or middle ear effusion. Normal orbits. IMPRESSION: BrainLAB protocol showing unchanged appearance of hemorrhagic lesion in the paramedian right frontal lobe with surrounding edema. Electronically Signed   By: KeUlyses Jarred.D.   On: 04/28/2021 20:09   MR Brain W and Wo Contrast  Result Date: 04/25/2021 CLINICAL DATA:  Cancer of unknown primary, staging EXAM: MRI HEAD WITHOUT AND WITH CONTRAST TECHNIQUE: Multiplanar, multiecho pulse  sequences of the brain and surrounding structures were obtained without and with intravenous contrast. CONTRAST:  1016mADAVIST  GADOBUTROL 1 MMOL/ML IV SOLN COMPARISON:  None. FINDINGS: Brain: Hemorrhagic lesion of the paramedian superior right frontal lobe measures 2.6 x 1.6 cm. There is moderate surrounding vasogenic edema. There is a hematocrit level within the lesion. Normal white matter signal, parenchymal volume and CSF spaces. The midline structures are normal. Contrast enhancement of the above described lesion is predominantly peripheral. There are no other contrast-enhancing lesions. Vascular: Major flow voids are preserved. Skull and upper cervical spine: Normal calvarium and skull base. Visualized upper cervical spine and soft tissues are normal. Sinuses/Orbits:No paranasal sinus fluid levels or advanced mucosal thickening. No mastoid or middle ear effusion. Normal orbits. IMPRESSION: Hemorrhagic lesion of the paramedian superior right frontal lobe with moderate surrounding vasogenic edema. This is most consistent with a solitary metastasis. Electronically Signed   By: Ulyses Jarred M.D.   On: 04/25/2021 00:15   CT CHEST ABDOMEN PELVIS W CONTRAST  Result Date: 04/25/2021 CLINICAL DATA:  Cancer of unknown primary, staging, history of left breast cancer in 2015, intracranial mass EXAM: CT CHEST, ABDOMEN, AND PELVIS WITH CONTRAST TECHNIQUE: Multidetector CT imaging of the chest, abdomen and pelvis was performed following the standard protocol during bolus administration of intravenous contrast. CONTRAST:  159m OMNIPAQUE IOHEXOL 300 MG/ML  SOLN COMPARISON:  None. FINDINGS: CT CHEST FINDINGS Cardiovascular: The heart and great vessels are unremarkable without pericardial effusion. Normal caliber of the thoracic aorta. Prominent atherosclerosis of the coronary vasculature. Mediastinum/Nodes: No pathologic mediastinal, hilar, or axillary adenopathy. Thyroid, trachea, and esophagus are unremarkable.  Lungs/Pleura: There is a left upper lobe spiculated suprahilar mass measuring 3.5 x 2.0 by 4.3 cm, consistent with primary bronchogenic neoplasm. There is an indeterminate 8 mm right upper lobe pulmonary nodule image 57/5, concerning for metastatic disease. No other acute airspace disease, effusion, or pneumothorax. Central airways are patent. Musculoskeletal: There are no acute or destructive bony lesions. Reconstructed images demonstrate no additional findings. CT ABDOMEN PELVIS FINDINGS Hepatobiliary: Gallbladder is surgically absent. No focal liver abnormalities. No biliary duct dilation. Pancreas: Unremarkable. No pancreatic ductal dilatation or surrounding inflammatory changes. Spleen: Normal in size without focal abnormality. Adrenals/Urinary Tract: Adrenal glands are unremarkable. Kidneys are normal, without renal calculi, focal lesion, or hydronephrosis. Evaluation of the bladder is limited due to streak artifact from bilateral hip arthroplasties. No gross abnormality. Stomach/Bowel: No bowel obstruction or ileus. Scattered diverticulosis of the distal colon without diverticulitis. No bowel wall thickening or inflammatory change. Vascular/Lymphatic: Aortic atherosclerosis. No enlarged abdominal or pelvic lymph nodes. Reproductive: Coarse uterine calcifications likely represent degenerating fibroids. No adnexal masses. Other: No free fluid or free gas.  No abdominal wall hernia. Musculoskeletal: Unremarkable bilateral hip arthroplasties. No acute or destructive bony lesions. Reconstructed images demonstrate no additional findings. IMPRESSION: 1. Large spiculated left upper lobe suprahilar mass most consistent with primary bronchogenic malignancy. This may be amenable to endobronchial sampling given proximity to the left upper bronchus. 2. Indeterminate 8 mm right upper lobe pulmonary nodule. Metastatic disease cannot be excluded. Attention on follow-up recommended. 3. No acute intra-abdominal or intrapelvic  process. No evidence of subdiaphragmatic metastases. 4. Sigmoid diverticulosis without diverticulitis. 5. Aortic Atherosclerosis (ICD10-I70.0). Coronary artery atherosclerosis. Electronically Signed   By: MRanda NgoM.D.   On: 04/25/2021 19:18   EEG adult  Result Date: 04/25/2021 YLora Havens MD     04/25/2021 12:46 PM Patient Name: GVenba ZennerMRN: [id number] Attending: PLora HavensReferring Physician/Provider: Dr CIrene PapDate: 04/25/2021 Duration: 22.33 mins Patient history:  75year old female with new onset seizure with  metastatic brain lesion. EEG to evaluate for seizure Level of alertness: Awake AEDs during EEG study: LEV, GBP Technical aspects: This EEG study was done with scalp electrodes positioned according to the 10-20 International system of electrode placement. Electrical activity was acquired at a sampling rate of _0  and reviewed with a high frequency filter of _1  and a low frequency filter of _2 . EEG data were recorded continuously and digitally stored. Description: The posterior dominant rhythm consists of 9 Hz activity of moderate voltage (25-35 uV) seen predominantly in posterior head regions, symmetric and reactive to eye opening and eye closing. Hyperventilation and photic stimulation were not performed.   IMPRESSION: This study is within normal limits. No seizures or epileptiform discharges were seen throughout the recording. Priyanka Barbra Sarks   Overnight EEG with video  Result Date: 04/27/2021 Lora Havens, MD     04/28/2021  6:56 AM Patient Name: Zunairah Devers MRN: [id number] Epilepsy Attending: Lora Havens Referring Physician/Provider: Dr Donnetta Simpers Duration: 04/26/2021 1206 to 04/27/2021 1206  Patient history:  75 year old female with new onset seizure with metastatic brain lesion. EEG to evaluate for seizure  Level of alertness: Awake, asleep  AEDs during EEG study: LEV, GBP  Technical aspects: This EEG study was done with scalp electrodes  positioned according to the 10-20 International system of electrode placement. Electrical activity was acquired at a sampling rate of _3  and reviewed with a high frequency filter of _4  and a low frequency filter of _5 . EEG data were recorded continuously and digitally stored.  Description: The posterior dominant rhythm consists of 8-9 Hz activity of moderate voltage (25-35 uV) seen predominantly in posterior head regions, symmetric and reactive to eye opening and eye closing. Sleep was characterized by vertex waves, sleep spindles (12 to 14 Hz), maximal frontocentral region.  Hyperventilation and photic stimulation were not performed.    IMPRESSION: This study is within normal limits. No seizures or epileptiform discharges were seen throughout the recording.  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  At this point, the patient would potentially benefit from radiotherapy. The options include whole brain irradiation versus stereotactic radiosurgery. There are pros and cons associated with each of these potential treatment options. Whole brain radiotherapy would treat the known metastatic deposits and help provide some reduction of risk for future brain metastases. However, whole brain radiotherapy carries potential risks including hair loss, subacute somnolence, and neurocognitive changes including a possible reduction in short-term memory. Whole brain radiotherapy also may carry a lower likelihood of tumor control at the treatment sites because of the low-dose used. Stereotactic radiosurgery carries a higher likelihood for local tumor control at the targeted sites with lower associated risk for neurocognitive changes such as memory loss. However, the use of stereotactic radiosurgery in this setting may leave the patient at increased risk  for new brain metastases elsewhere in the brain as high as 50-60%. Accordingly, patients who receive stereotactic radiosurgery in this setting should undergo ongoing surveillance imaging with brain MRI more frequently in order to identify and treat new small brain metastases before they become symptomatic. Stereotactic radiosurgery does carry some different risks, including a risk of radionecrosis.  PLAN: Today, I reviewed the findings and workup thus far with the patient. We discussed the dilemma regarding whole brain radiotherapy versus stereotactic radiosurgery. We discussed the pros and cons of  each. We also discussed the logistics and delivery of each. We reviewed the results associated with each of the treatments described above. The patient seems to understand the treatment options and would like to proceed with stereotactic radiosurgery.  I personally spent 60 minutes in this encounter including chart review, reviewing radiological studies, meeting face-to-face with the patient, entering orders and completing documentation.     ------------------------------------------------   Tyler Pita, MD [place]: [phone]  Fax: [phone] [email or web address]  Skype  LinkedIn

## 2021-05-21 NOTE — Progress Notes (Signed)
Per Dr. Chryl Heck, I printed off [facility] report and place it in the items to be scanned into Epic.

## 2021-05-21 NOTE — Progress Notes (Signed)
Location/Histology of Brain Tumor: Primary LUL Lung mets to Paramedial superior right frontal lobe.  Patient presented to the ER in early August after seizure like activity with left side jerking movements.  MRI Brain 05/01/2021: Gross total resection of the hemorrhagic cystic metastasis at the right brain vertex. No operative complication is discernible.  Regional edema appears similar. Linear enhancement along the margins of the post resection space will need to be followed for evidence regression or progression.  CT Chest 04/25/2021: Large spiculated left upper lobe suprahilar mass measuring 3.5 x 2.0 x 4.3 cm, consistent with primary bronchogenic malignancy.  MRI Brain 04/24/2021: Hemorrhagic lesion of the paramedian superior right frontal lobe measuring 2.6 x 1.6 cm with moderate surrounding vasogenic edema. This is most consistent with a solitary metastasis.  Past or anticipated interventions, if any, per neurosurgery:  Dr. Annette Stable -Right Parietal Craniotomy for tumor with brain lab 04/30/2021   Past or anticipated interventions, if any, per medical oncology:   Dose of Decadron, if applicable: 2 mg BID  Recent neurologic symptoms, if any:  Seizures: None since diagnosis Headaches: No Nausea: No Dizziness/ataxia: No Difficulty with hand coordination: Left Side, much improved, Has been working with PT for 2 weeks. Focal numbness/weakness: Left side Visual deficits/changes: No Confusion/Memory deficits: No   SAFETY ISSUES: Prior radiation? Left Breast 2015 Pacemaker/ICD? No Possible current pregnancy? Postmenopausal Is the patient on methotrexate? No  Additional Complaints / other details:

## 2021-05-22 ENCOUNTER — Ambulatory Visit: Payer: Medicare Other

## 2021-05-22 ENCOUNTER — Other Ambulatory Visit: Payer: Self-pay

## 2021-05-22 ENCOUNTER — Ambulatory Visit
Admission: RE | Admit: 2021-05-22 | Discharge: 2021-05-22 | Disposition: A | Discharge status: Home / Self Care | Payer: Medicare Other | Source: Ambulatory Visit | Attending: Radiation Oncology | Admitting: Radiation Oncology

## 2021-05-22 ENCOUNTER — Encounter: Payer: Self-pay | Admitting: Hematology and Oncology

## 2021-05-22 ENCOUNTER — Ambulatory Visit (HOSPITAL_BASED_OUTPATIENT_CLINIC_OR_DEPARTMENT_OTHER): Payer: Medicare Other | Admitting: Physical Therapy

## 2021-05-22 ENCOUNTER — Encounter (HOSPITAL_COMMUNITY): Payer: Self-pay

## 2021-05-22 ENCOUNTER — Inpatient Hospital Stay (HOSPITAL_BASED_OUTPATIENT_CLINIC_OR_DEPARTMENT_OTHER): Payer: Medicare Other | Admitting: Hematology and Oncology

## 2021-05-22 VITALS — BP 136/60 | HR 74 | Temp 98.0°F | Resp 18 | Ht 66.0 in | Wt 207.8 lb

## 2021-05-22 DIAGNOSIS — C3412 Malignant neoplasm of upper lobe, left bronchus or lung: Secondary | ICD-10-CM | POA: Diagnosis not present

## 2021-05-22 DIAGNOSIS — R5383 Other fatigue: Secondary | ICD-10-CM | POA: Diagnosis not present

## 2021-05-22 DIAGNOSIS — Z86010 Personal history of colonic polyps: Secondary | ICD-10-CM | POA: Diagnosis not present

## 2021-05-22 DIAGNOSIS — G936 Cerebral edema: Secondary | ICD-10-CM | POA: Diagnosis not present

## 2021-05-22 DIAGNOSIS — C7931 Secondary malignant neoplasm of brain: Secondary | ICD-10-CM | POA: Diagnosis not present

## 2021-05-22 DIAGNOSIS — C7801 Secondary malignant neoplasm of right lung: Secondary | ICD-10-CM | POA: Insufficient documentation

## 2021-05-22 DIAGNOSIS — Z79899 Other long term (current) drug therapy: Secondary | ICD-10-CM | POA: Diagnosis not present

## 2021-05-22 DIAGNOSIS — A150 Tuberculosis of lung: Secondary | ICD-10-CM | POA: Diagnosis not present

## 2021-05-22 DIAGNOSIS — R569 Unspecified convulsions: Secondary | ICD-10-CM | POA: Diagnosis not present

## 2021-05-22 DIAGNOSIS — I517 Cardiomegaly: Secondary | ICD-10-CM | POA: Diagnosis not present

## 2021-05-22 DIAGNOSIS — I700 Atherosclerosis of aorta: Secondary | ICD-10-CM | POA: Insufficient documentation

## 2021-05-22 DIAGNOSIS — Z7952 Long term (current) use of systemic steroids: Secondary | ICD-10-CM | POA: Insufficient documentation

## 2021-05-22 DIAGNOSIS — Z96643 Presence of artificial hip joint, bilateral: Secondary | ICD-10-CM | POA: Diagnosis not present

## 2021-05-22 DIAGNOSIS — Z9049 Acquired absence of other specified parts of digestive tract: Secondary | ICD-10-CM | POA: Insufficient documentation

## 2021-05-22 DIAGNOSIS — Z87891 Personal history of nicotine dependence: Secondary | ICD-10-CM | POA: Insufficient documentation

## 2021-05-22 DIAGNOSIS — C3492 Malignant neoplasm of unspecified part of left bronchus or lung: Secondary | ICD-10-CM | POA: Diagnosis not present

## 2021-05-22 DIAGNOSIS — C50911 Malignant neoplasm of unspecified site of right female breast: Secondary | ICD-10-CM | POA: Insufficient documentation

## 2021-05-22 DIAGNOSIS — Z881 Allergy status to other antibiotic agents status: Secondary | ICD-10-CM | POA: Diagnosis not present

## 2021-05-22 DIAGNOSIS — Z888 Allergy status to other drugs, medicaments and biological substances status: Secondary | ICD-10-CM | POA: Diagnosis not present

## 2021-05-22 DIAGNOSIS — Z87440 Personal history of urinary (tract) infections: Secondary | ICD-10-CM | POA: Diagnosis not present

## 2021-05-22 DIAGNOSIS — M8580 Other specified disorders of bone density and structure, unspecified site: Secondary | ICD-10-CM | POA: Insufficient documentation

## 2021-05-22 DIAGNOSIS — Z170 Estrogen receptor positive status [ER+]: Secondary | ICD-10-CM | POA: Insufficient documentation

## 2021-05-22 DIAGNOSIS — C7951 Secondary malignant neoplasm of bone: Secondary | ICD-10-CM | POA: Insufficient documentation

## 2021-05-22 DIAGNOSIS — K5730 Diverticulosis of large intestine without perforation or abscess without bleeding: Secondary | ICD-10-CM | POA: Insufficient documentation

## 2021-05-23 ENCOUNTER — Ambulatory Visit
Admit: 2021-05-23 | Discharge: 2021-05-23 | Disposition: A | Discharge status: Home / Self Care | Payer: Medicare Other | Attending: Radiation Oncology | Admitting: Radiation Oncology

## 2021-05-23 DIAGNOSIS — C3490 Malignant neoplasm of unspecified part of unspecified bronchus or lung: Secondary | ICD-10-CM | POA: Diagnosis not present

## 2021-05-23 DIAGNOSIS — C7931 Secondary malignant neoplasm of brain: Secondary | ICD-10-CM

## 2021-05-23 DIAGNOSIS — Z9889 Other specified postprocedural states: Secondary | ICD-10-CM | POA: Diagnosis not present

## 2021-05-23 DIAGNOSIS — G936 Cerebral edema: Secondary | ICD-10-CM | POA: Diagnosis not present

## 2021-05-23 MED ORDER — GADOBENATE DIMEGLUMINE 529 MG/ML IV SOLN
20.0000 mL | Freq: Once | INTRAVENOUS | Status: AC | PRN
  Administered 2021-05-23: 20 mL via INTRAVENOUS

## 2021-05-24 ENCOUNTER — Ambulatory Visit (HOSPITAL_BASED_OUTPATIENT_CLINIC_OR_DEPARTMENT_OTHER): Payer: Medicare Other | Admitting: Physical Therapy

## 2021-05-24 ENCOUNTER — Other Ambulatory Visit: Payer: Self-pay | Admitting: *Deleted

## 2021-05-24 DIAGNOSIS — C7931 Secondary malignant neoplasm of brain: Secondary | ICD-10-CM | POA: Insufficient documentation

## 2021-05-24 DIAGNOSIS — C3412 Malignant neoplasm of upper lobe, left bronchus or lung: Secondary | ICD-10-CM | POA: Diagnosis not present

## 2021-05-24 NOTE — Progress Notes (Signed)
The proposed treatment discussed in cancer conference is for discussion purpose only and is not a binding recommendation. The patient was not physically examined nor present for their treatment options. Therefore, final treatment plans cannot be decided.  ?

## 2021-05-25 ENCOUNTER — Other Ambulatory Visit: Payer: Self-pay

## 2021-05-25 ENCOUNTER — Ambulatory Visit
Admission: RE | Admit: 2021-05-25 | Discharge: 2021-05-25 | Disposition: A | Discharge status: Home / Self Care | Payer: Medicare Other | Source: Ambulatory Visit | Attending: Radiation Oncology | Admitting: Radiation Oncology

## 2021-05-25 VITALS — BP 135/77 | HR 65 | Temp 97.8°F | Resp 20

## 2021-05-25 DIAGNOSIS — C7931 Secondary malignant neoplasm of brain: Secondary | ICD-10-CM

## 2021-05-25 DIAGNOSIS — C3412 Malignant neoplasm of upper lobe, left bronchus or lung: Secondary | ICD-10-CM | POA: Diagnosis not present

## 2021-05-25 NOTE — Progress Notes (Signed)
Patient rested with [name] for 15 minutes following his SRS treatment.  Patient denies headache, dizziness, nausea, diplopia or ringing in the ears. Denies fatigue. Patient without complaints. Understands to avoid strenuous activity for the next 24 hours and call [phone] with needs.  BP 135/77 (BP Location: Left Arm, Patient Position: Sitting, Cuff Size: Large)   Pulse 65   Temp 97.8 F (36.6 C)   Resp 20   SpO2 97%

## 2021-05-25 NOTE — Op Note (Signed)
  Name: Tara Murphy  MRN: [id number]  Date: 05/25/2021   DOB: 1946-01-13  Stereotactic Radiosurgery Operative Note  PRE-OPERATIVE DIAGNOSIS:  Solitary Brain Metastasis  POST-OPERATIVE DIAGNOSIS:  Solitary Brain Metastasis  PROCEDURE:  Stereotactic Radiosurgery  SURGEON:  Charlie Pitter, MD  NARRATIVE: The patient underwent a radiation treatment planning session in the radiation oncology simulation suite under the care of the radiation oncology physician and physicist.  I participated closely in the radiation treatment planning afterwards. The patient underwent planning CT which was fused to 3T high resolution MRI with 1 mm axial slices.  These images were fused on the planning system.  We contoured the gross target volumes and subsequently expanded this to yield the Planning Target Volume. I actively participated in the planning process.  I helped to define and review the target contours and also the contours of the optic pathway, eyes, brainstem and selected nearby organs at risk.  All the dose constraints for critical structures were reviewed and compared to AAPM Task Group 101.  The prescription dose conformity was reviewed.  I approved the plan electronically.    Accordingly, Tara Murphy was brought to the TrueBeam stereotactic radiation treatment linac and placed in the custom immobilization mask.  The patient was aligned according to the IR fiducial markers with BrainLab Exactrac, then orthogonal x-rays were used in ExacTrac with the 6DOF robotic table and the shifts were made to align the patient  [name] received stereotactic radiosurgery uneventfully.    The detailed description of the procedure is recorded in the radiation oncology procedure note.  I was present for the duration of the procedure.  DISPOSITION:  Following delivery, the patient was transported to nursing in stable condition and monitored for possible acute effects to be discharged to home in stable condition with  follow-up in one month.  Charlie Pitter, MD 05/25/2021 4:13 PM

## 2021-05-26 ENCOUNTER — Other Ambulatory Visit: Payer: Self-pay | Admitting: Family Medicine

## 2021-05-26 ENCOUNTER — Other Ambulatory Visit: Payer: Self-pay | Admitting: Radiation Oncology

## 2021-05-26 DIAGNOSIS — C7931 Secondary malignant neoplasm of brain: Secondary | ICD-10-CM

## 2021-05-26 MED ORDER — DEXAMETHASONE 2 MG PO TABS: 2.0000 mg | ORAL_TABLET | Freq: Two times a day (BID) | ORAL | 0 refills | Status: DC

## 2021-05-26 NOTE — Progress Notes (Signed)
On call nurse called to inform me that she heard from patient who reported she is about to run out of Dexamethasone.  I placed a refill foe 30 tablets and instructed nurse to inform patient to continue at her current dose for now. Ccing Dr. Tammi Klippel and Freeman Caldron  PA who will determine when taper from current dose is appropriate. -----------------------------------  Eppie Gibson, MD

## 2021-05-28 NOTE — Progress Notes (Signed)
  Radiation Oncology         [id number]) [phone] ________________________________  Stereotactic Treatment Procedure Note  Name: Tara Murphy MRN: [id number]  Date: 05/25/2021  DOB: Jan 12, 1946  SPECIAL TREATMENT PROCEDURE    ICD-10-CM   1. Metastatic adenocarcinoma to brain [facility])  C79.31       3D TREATMENT PLANNING AND DOSIMETRY:  The patient's radiation plan was reviewed and approved by neurosurgery and radiation oncology prior to treatment.  It showed 3-dimensional radiation distributions overlaid onto the planning CT/MRI image set.  The [facility] for the target structures as well as the organs at risk were reviewed. The documentation of the 3D plan and dosimetry are filed in the radiation oncology EMR.  NARRATIVE:  Tara Murphy was brought to the TrueBeam stereotactic radiation treatment machine and placed supine on the CT couch. The head frame was applied, and the patient was set up for stereotactic radiosurgery.  Neurosurgery was present for the set-up and delivery  SIMULATION VERIFICATION:  In the couch zero-angle position, the patient underwent Exactrac imaging using the Brainlab system with orthogonal KV images.  These were carefully aligned and repeated to confirm treatment position for each of the isocenters.  The Exactrac snap film verification was repeated at each couch angle.  PROCEDURE: Tara Murphy received stereotactic radiosurgery to the following targets: Right Frontal 25 mm target was treated using 4 Rapid Arc VMAT Beams to a prescription dose of 9 Gy which will repeated for a total of 3 fractions to 27 Gy.  ExacTrac registration was performed for each couch angle.  The 100% isodose line was prescribed.  6 MV X-rays were delivered in the flattening filter free beam mode.  STEREOTACTIC TREATMENT MANAGEMENT:  Following delivery, the patient was transported to nursing in stable condition and monitored for possible acute effects.  Vital signs were recorded BP 135/77 (BP Location: Left  Arm, Patient Position: Sitting, Cuff Size: Large)   Pulse 65   Temp 97.8 F (36.6 C)   Resp 20   SpO2 97% . The patient tolerated treatment without significant acute effects, and was discharged to home in stable condition.    PLAN: Finish planned 3 fractions and then follow-up in one month.  ________________________________  Sheral Apley. Tammi Klippel, M.D.

## 2021-05-29 ENCOUNTER — Other Ambulatory Visit: Payer: Self-pay

## 2021-05-29 ENCOUNTER — Encounter: Payer: Self-pay | Admitting: Urology

## 2021-05-29 ENCOUNTER — Ambulatory Visit
Admission: RE | Admit: 2021-05-29 | Discharge: 2021-05-29 | Disposition: A | Discharge status: Home / Self Care | Payer: Medicare Other | Source: Ambulatory Visit | Attending: Radiation Oncology | Admitting: Radiation Oncology

## 2021-05-29 ENCOUNTER — Encounter: Payer: Self-pay | Admitting: Radiation Therapy

## 2021-05-29 ENCOUNTER — Telehealth: Payer: Self-pay | Admitting: *Deleted

## 2021-05-29 ENCOUNTER — Other Ambulatory Visit: Payer: Self-pay | Admitting: Urology

## 2021-05-29 VITALS — BP 138/67 | HR 57 | Temp 97.8°F | Resp 20

## 2021-05-29 DIAGNOSIS — C7931 Secondary malignant neoplasm of brain: Secondary | ICD-10-CM | POA: Diagnosis not present

## 2021-05-29 MED ORDER — DEXAMETHASONE 2 MG PO TABS: 2.0000 mg | ORAL_TABLET | Freq: Two times a day (BID) | ORAL | 1 refills | Status: DC

## 2021-05-29 NOTE — Progress Notes (Signed)
Post SRS treatment evaluation  2nd out of 3rd treatment to brain.   Patient denies having any nausea/vomiting/dizziness/headache.  Patient reports fatigue but reports that she feels better after a nap.  Left sided weakness Is her baseline.  She has some lower level extremity swelling. Currently on decadron.  Post treatment Vital Signs WNL Vitals:   05/29/21 1117 05/29/21 1135  BP: (!) 143/68 138/67  Pulse: 60 (!) 57  Resp: 20 20  Temp: 97.8 F (36.6 C) 97.8 F (36.6 C)  TempSrc:  Oral  SpO2: 97% 97%

## 2021-05-29 NOTE — Progress Notes (Addendum)
Pt complaining of excessive fatigue with increased difficulty standing and ambulating since receiving the first fraction of SRS. Tara Murphy has instructed pt to increase her morning dose of decadron to 4mg  and continue taking 2mg  in the evening. She is to continue this dose for two weeks and then return back to 2mg  BID. We will issue official taper instructions the day of her final fraction of SRS.   Mont Dutton R.T.(R)(T) Radiation Special Procedures Navigator    Taper Instructions to be shared the final day of treatment:  Continue Dexamethasone 4 mg in the am and 2 mg at night x1 week  2mg  po 2 times a day for a week  2mg  daily in the morning for a week  Then Stop

## 2021-05-29 NOTE — Progress Notes (Signed)
Patient reported significant increased fatigue/malaise and decreased appetite as well as generalized weakness since the time of her initial treatment. She has been taking Dexamethasone 2mg  po BID so we will try increasing to 4mg  po qam and 2mg  po qhs for the next week and then consider tapering off once she has completed her treatments.  Nicholos Johns, MMS, PA-C [facility] [place]: [phone]  Fax: [phone]

## 2021-05-31 ENCOUNTER — Encounter: Payer: Self-pay | Admitting: Radiation Oncology

## 2021-05-31 ENCOUNTER — Ambulatory Visit
Admission: RE | Admit: 2021-05-31 | Discharge: 2021-05-31 | Disposition: A | Discharge status: Home / Self Care | Payer: Medicare Other | Source: Ambulatory Visit | Attending: Radiation Oncology | Admitting: Radiation Oncology

## 2021-05-31 ENCOUNTER — Other Ambulatory Visit: Payer: Self-pay

## 2021-05-31 DIAGNOSIS — C7931 Secondary malignant neoplasm of brain: Secondary | ICD-10-CM | POA: Diagnosis not present

## 2021-05-31 NOTE — Progress Notes (Signed)
Patient in after [place] doing well is aware of what she needs to call for. States she took ibuprofen last treatment and had no headache so she will take again today. Denies any vision changes does admit to fatigue. 97.3 78 18 149/71 96. Discharged to home with family

## 2021-05-31 NOTE — Telephone Encounter (Signed)
Confirmed patient had her steroid refill

## 2021-06-01 ENCOUNTER — Ambulatory Visit (HOSPITAL_COMMUNITY)
Admission: RE | Admit: 2021-06-01 | Discharge: 2021-06-01 | Disposition: A | Discharge status: Home / Self Care | Payer: Medicare Other | Source: Ambulatory Visit | Attending: Radiation Oncology | Admitting: Radiation Oncology

## 2021-06-01 ENCOUNTER — Other Ambulatory Visit: Payer: Self-pay | Admitting: Family Medicine

## 2021-06-01 ENCOUNTER — Ambulatory Visit: Payer: Medicare Other | Admitting: Neurology

## 2021-06-01 DIAGNOSIS — C7931 Secondary malignant neoplasm of brain: Secondary | ICD-10-CM | POA: Diagnosis not present

## 2021-06-01 DIAGNOSIS — J984 Other disorders of lung: Secondary | ICD-10-CM | POA: Diagnosis not present

## 2021-06-01 DIAGNOSIS — R220 Localized swelling, mass and lump, head: Secondary | ICD-10-CM | POA: Diagnosis not present

## 2021-06-01 DIAGNOSIS — C3490 Malignant neoplasm of unspecified part of unspecified bronchus or lung: Secondary | ICD-10-CM | POA: Diagnosis not present

## 2021-06-01 DIAGNOSIS — D259 Leiomyoma of uterus, unspecified: Secondary | ICD-10-CM | POA: Diagnosis not present

## 2021-06-01 DIAGNOSIS — C3412 Malignant neoplasm of upper lobe, left bronchus or lung: Secondary | ICD-10-CM | POA: Diagnosis not present

## 2021-06-01 LAB — GLUCOSE, CAPILLARY: Glucose-Capillary: 102 mg/dL — ABNORMAL HIGH (ref 70–99)

## 2021-06-01 MED ORDER — FLUDEOXYGLUCOSE F - 18 (FDG) INJECTION
10.3300 | Freq: Once | INTRAVENOUS | Status: AC
  Administered 2021-06-01: 10.33 via INTRAVENOUS

## 2021-06-03 ENCOUNTER — Other Ambulatory Visit: Payer: Self-pay | Admitting: Family Medicine

## 2021-06-04 ENCOUNTER — Other Ambulatory Visit: Payer: Self-pay | Admitting: *Deleted

## 2021-06-04 ENCOUNTER — Institutional Professional Consult (permissible substitution) (INDEPENDENT_AMBULATORY_CARE_PROVIDER_SITE_OTHER): Payer: Medicare Other | Admitting: Thoracic Surgery (Cardiothoracic Vascular Surgery)

## 2021-06-04 ENCOUNTER — Other Ambulatory Visit: Payer: Self-pay

## 2021-06-04 ENCOUNTER — Encounter: Payer: Self-pay | Admitting: Thoracic Surgery (Cardiothoracic Vascular Surgery)

## 2021-06-04 VITALS — BP 119/73 | HR 69 | Resp 20 | Ht 66.0 in | Wt 203.0 lb

## 2021-06-04 DIAGNOSIS — C3412 Malignant neoplasm of upper lobe, left bronchus or lung: Secondary | ICD-10-CM | POA: Diagnosis not present

## 2021-06-04 NOTE — H&P (View-Only) (Signed)
PCP is Leamon Arnt, MD Referring Provider is Curt Bears, MD  Chief Complaint  Patient presents with   Lung Cancer    New patient consultation, PET 9/9, CT CAP 8/16, MRI brain 8/3    HPI: Tara Murphy is sent for consultation for a left upper lobe lung mass  Tara Murphy is a 75 year old woman with a history of tobacco abuse (quit 2000), stage IV adenocarcinoma of the lung, brain metastasis, left leg weakness, left foot drop, hyperlipidemia, breast cancer, arthritis, total hip replacement, major depression, peripheral neuropathy, irritable bowel syndrome, and osteopenia.  About a pack a day before quitting in 2000.  She been feeling poorly dating back to February 2022.  In June she developed a left foot drop and peripheral neuropathy.  She presented on 04/24/2021 with a seizure.  A CT showed a hemorrhagic lesion in the right parasagittal frontal parietal tumor measuring 4.6 x 1.6 cm with surrounding vasogenic edema.  CT of the chest showed a large left upper lobe lung mass.  Dr. Trenton Gammon did a craniotomy for resection on 04/30/2021.  She went to rehab afterwards.  She then had 3 treatments of stereotactic radiation.  PET/CT was recently done which showed hypermetabolic left upper lobe lung mass.  There is a 7 mm lesion in the right upper lobe that is too small to characterize.  There is no evidence of adenopathy or other metastases.  She currently is walking with a walker.  She still has weakness and foot drop of the left leg.  She is not having any chest pain, pressure, tightness, shortness of breath, cough, or wheezing.  She denies weight loss.  She does complain of decreased energy.  She also has had some swelling in her left leg which has been worse since she got home from rehab.  Zubrod Score: At the time of surgery this patient's most appropriate activity status/level should be described as: []     0    Normal activity, no symptoms [x]     1    Restricted in physical strenuous activity  but ambulatory, able to do out light work []     2    Ambulatory and capable of self care, unable to do work activities, up and about >50 % of waking hours                              []     3    Only limited self care, in bed greater than 50% of waking hours []     4    Completely disabled, no self care, confined to bed or chair []     5    Moribund  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[place])    S/p total hip replacement left   Breast cancer ([place]) 2015   left    Cataract    Cholecystolithiasis    Chronic allergic rhinitis    Depression    Former smoker, stopped smoking in distant past 10/05/2019   Quit 2000; 74 y smoking history   GERD (gastroesophageal reflux disease)    Hepatic steatosis 06/08/2020   Intermittent elevated LFTs and ultrasound 2018   Hyperlipemia    IBS (irritable bowel syndrome) 10/08/2018   Nl colonoscopy and EGD 09/2018   IBS (irritable bowel syndrome)    Lichen plano-pilaris    Major depression, chronic 09/05/2015  Neuropathy, peripheral, idiopathic    uses gabapentin   Osteopenia 09/07/2019   Dexa 08/2019: T = -1.2 at wrist; back and hips excluded (DJD and hardware).    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 Repeat 2020   Urinary tract infection    hx of frequent     Past Surgical History:  Procedure Laterality Date   APPENDECTOMY     APPLICATION OF CRANIAL NAVIGATION Right 04/30/2021   Procedure: APPLICATION OF CRANIAL NAVIGATION;  Surgeon: Earnie Larsson, MD;  Location: [place];  Service: Neurosurgery;  Laterality: Right;   BREAST LUMPECTOMY Left 2015   cataracts     CHOLECYSTECTOMY N/A 10/29/2016   Procedure: LAPAROSCOPIC CHOLECYSTECTOMY;  Surgeon: Erroll Luna, MD;  Location: [place];  Service: General;  Laterality: N/A;   COLONOSCOPY     CRANIOTOMY Right 04/30/2021   Procedure: Right Parietal  Craniotomy for tumor with Brain Lab;  Surgeon: Earnie Larsson, MD;  Location: [place];  Service: Neurosurgery;  Laterality: Right;   LIPOSUCTION     TOTAL HIP ARTHROPLASTY     bilateral hip arthoplasty   TOTAL HIP ARTHROPLASTY Right 12/18/2015   Procedure: RIGHT TOTAL HIP ARTHROPLASTY ANTERIOR APPROACH;  Surgeon: Gaynelle Arabian, MD;  Location: [facility];  Service: Orthopedics;  Laterality: Right;   TOTAL KNEE ARTHROPLASTY     TOTAL KNEE ARTHROPLASTY Left 11/25/2016   Procedure: LEFT TOTAL KNEE ARTHROPLASTY;  Surgeon: Gaynelle Arabian, MD;  Location: [facility];  Service: Orthopedics;  Laterality: Left;  with abductor block   TUBAL LIGATION     UPPER GASTROINTESTINAL ENDOSCOPY      Family History  Problem Relation Age of Onset   Arthritis Mother    Diabetes Mother    Crohn's disease Mother    Arthritis Father    Lung cancer Father    Breast cancer Sister 37   Hyperlipidemia Maternal Aunt    Stomach cancer Maternal Grandmother    Colitis Neg Hx    Rectal cancer Neg Hx    Esophageal cancer Neg Hx     Social History Social History   Tobacco Use   Smoking status: Former    Packs/day: 1.00    Types: Cigarettes    Quit date: 11/22/1991    Years since quitting: 29.5   Smokeless tobacco: Never  Vaping Use   Vaping Use: Never used  Substance Use Topics   Alcohol use: Yes    Alcohol/week: 14.0 standard drinks    Types: 14 Glasses of wine per week   Drug use: No    Current Outpatient Medications  Medication Sig Dispense Refill   busPIRone (BUSPAR) 7.5 MG tablet Take 1 tablet (7.5 mg total) by mouth 2 (two) times daily as needed (anxiety). 30 tablet 0   dexamethasone (DECADRON) 2 MG tablet Take 1 tablet (2 mg total) by mouth every 12 (twelve) hours. 30 tablet 1   gabapentin (NEURONTIN) 300 MG capsule TAKE TWO CAPSULES BY MOUTH TWICE A DAY 360 capsule 3   rOPINIRole (REQUIP) 4 MG tablet Take 1 tablet (4 mg total) by mouth at bedtime. 30 tablet 0   No current facility-administered medications for  this visit.    Allergies  Allergen Reactions   Fluorescein Itching and Other (See Comments)   Contrast Media [Iodinated Diagnostic Agents] Hives   Hydromorphone Hcl Nausea Only   Other Rash    Certain Band-Aids don't agree with the patient's skin; hospital ID wristband  causes rashes   Sulfamethoxazole-Trimethoprim Hives   Fentanyl Other (See Comments)    Redness and flushing over body    Tape Rash    Certain Band-Aids don't agree with the patient's skin; hospital ID wristband causes rashes   Terbinafine And Related Hives and Rash    Review of Systems  Constitutional:  Positive for activity change and fatigue. Negative for unexpected weight change.  HENT:  Negative for trouble swallowing and voice change.   Respiratory:  Positive for apnea. Negative for cough, shortness of breath and wheezing.   Cardiovascular:  Positive for leg swelling. Negative for chest pain.  Musculoskeletal:  Positive for arthralgias, gait problem and joint swelling.  Neurological:  Positive for seizures, weakness and numbness.  Psychiatric/Behavioral:  Positive for dysphoric mood. The patient is nervous/anxious.   All other systems reviewed and are negative.  BP 119/73 (BP Location: Left Arm, Patient Position: Sitting, Cuff Size: Large)   Pulse 69   Resp 20   Ht 5\' 6"  (1.676 m)   Wt 203 lb (92.1 kg)   SpO2 94% Comment: RA  BMI 32.77 kg/m  Physical Exam Vitals reviewed.  Constitutional:      General: She is not in acute distress.    Appearance: Normal appearance.  HENT:     Head: Normocephalic and atraumatic.  Eyes:     General: No scleral icterus.    Extraocular Movements: Extraocular movements intact.  Neck:     Vascular: No carotid bruit.  Cardiovascular:     Rate and Rhythm: Normal rate and regular rhythm.     Pulses: Normal pulses.     Heart sounds: Normal heart sounds. No murmur heard.   No friction rub. No gallop.  Pulmonary:     Effort: Pulmonary effort is normal. No respiratory  distress.     Breath sounds: Normal breath sounds. No wheezing.  Abdominal:     General: There is no distension.     Palpations: Abdomen is soft.     Tenderness: There is no abdominal tenderness.  Musculoskeletal:     Cervical back: Neck supple.     Left lower leg: Edema (2+) present.  Lymphadenopathy:     Cervical: No cervical adenopathy.  Skin:    General: Skin is warm and dry.  Neurological:     Mental Status: She is alert and oriented to person, place, and time.     Motor: Weakness (Left leg weakness with foot drop) present.     Gait: Gait abnormal.  Psychiatric:        Mood and Affect: Mood normal.    Diagnostic Tests: NUCLEAR MEDICINE PET SKULL BASE TO THIGH   TECHNIQUE: 10.33 mCi F-18 FDG was injected intravenously. Full-ring PET imaging was performed from the skull base to thigh after the radiotracer. CT data was obtained and used for attenuation correction and anatomic localization.   Fasting blood glucose: 102 mg/dl   COMPARISON:  Chest CT 04/25/2021   FINDINGS: Mediastinal blood pool activity: SUV max 2.93   Liver activity: SUV max NA   NECK: No hypermetabolic lymph nodes in the neck.   Incidental CT findings: none   CHEST: The elongated 4.5 cm left upper lobe lung mass is hypermetabolic with SUV max of 26.37. This extends down to the left suprahilar region but I do not see any discrete hypermetabolic mediastinal or hilar lymph nodes.   Stable 7 mm right upper lobe pulmonary nodule. No definite hypermetabolism is demonstrated. No other pulmonary nodules are identified.  No supraclavicular or axillary adenopathy.  No breast masses.   Incidental CT findings: Stable coronary artery calcifications. Stable remote postoperative changes involving the medial left breast.   ABDOMEN/PELVIS: No abnormal hypermetabolic activity within the liver, pancreas, adrenal glands, or spleen. No hypermetabolic lymph nodes in the abdomen or pelvis.   Incidental CT  findings: Status post cholecystectomy. No biliary dilatation. Small calcified uterine fibroids are noted.   SKELETON: No findings suspicious for osseous metastatic disease. Artifact associated with bilateral hip prostheses.   Incidental CT findings: none   IMPRESSION: 1. Left upper lobe pulmonary lesion is hypermetabolic and consistent with primary lung neoplasm. 2. No enlarged or hypermetabolic mediastinal or hilar lymph nodes are identified. 3. Stable 7 mm right upper lobe pulmonary nodule. 4. No findings for abdominal/pelvic metastatic disease or osseous metastatic disease.     Electronically Signed   By: Marijo Sanes M.D.   On: 06/03/2021 16:28 I personally reviewed the PET/CT images.  There is a hypermetabolic left upper lobe mass with no hilar or mediastinal adenopathy and no evidence of distant metastatic disease.  Stable 7 mm right upper lobe lung nodule below the limit of PET resolution.  Impression: Tara Murphy is a 75 year old woman with a history of tobacco abuse (quit 2000), stage IV adenocarcinoma of the lung, brain metastasis, left leg weakness, left foot drop, hyperlipidemia, breast cancer, arthritis, total hip replacement, major depression, peripheral neuropathy, irritable bowel syndrome, and osteopenia.  She developed a left foot drop back in June.  She then developed peripheral neuropathy.  While waiting to see a neurologist she had a seizure.  CT of the head showed a solitary right frontal parietal tumor with vasogenic edema.  She had resection of the tumor followed by stereotactic radiation.  Pathology showed metastatic adenocarcinoma from the lung.  She has done well following her craniotomy.  She does still have a persistent foot drop and weakness in the left leg.  She is walking with a walker.  Work-up shows a 4.5 cm left upper lobe mass with no hilar or mediastinal adenopathy and no other evidence of distant disease.  Even though she has stage IV lung cancer  she does want to a subset of patients that have shown benefit with surgical resection of the primary lesion.  I discussed this with Mrs. Paschen and her family.  They understand there is about a 25% chance of 5-year survival with surgical resection.  They understand that chemo, radiation, and immunotherapy are alternative treatments.  I do think she is a candidate for surgery.  She is not having any respiratory issues.  She has not yet had pulmonary function testing, which we need to do to confirm that she has adequate pulmonary reserve.  I discussed the proposed operation of robotic assisted left upper lobectomy with Mrs. Procell and her family.  I informed them of the general nature of the procedure including the need for general anesthesia, the incisions to be used, the possible need for conversion, the use of a drainage tube postoperatively, the expected hospital stay, and the overall recovery.  I informed them of the indications, risks, benefits, and alternatives.  They understand the risks include, but not limited to death, MI, DVT, PE, bleeding, possible need for transfusion, infection, prolonged air leaks, cardiac arrhythmias, as well as the possibility of other unforeseeable complications.  Tobacco abuse-quit 25 years ago.  Coronary calcification on CT-no anginal symptoms.  No further work-up indicated at this time.  Plan: Pulmonary function testing Tentatively plan for  robotic assisted left upper lobectomy on Wednesday, 06/13/2021  Melrose Nakayama, MD [facility] [phone]

## 2021-06-04 NOTE — Progress Notes (Signed)
PCP is Leamon Arnt, MD Referring Provider is Curt Bears, MD  Chief Complaint  Patient presents with   Lung Cancer    New patient consultation, PET 9/9, CT CAP 8/16, MRI brain 8/3    HPI: Tara Murphy is sent for consultation for a left upper lobe lung mass  Tara Murphy is a 75 year old woman with a history of tobacco abuse (quit 2000), stage IV adenocarcinoma of the lung, brain metastasis, left leg weakness, left foot drop, hyperlipidemia, breast cancer, arthritis, total hip replacement, major depression, peripheral neuropathy, irritable bowel syndrome, and osteopenia.  About a pack a day before quitting in 2000.  She been feeling poorly dating back to February 2022.  In June she developed a left foot drop and peripheral neuropathy.  She presented on 04/24/2021 with a seizure.  A CT showed a hemorrhagic lesion in the right parasagittal frontal parietal tumor measuring 4.6 x 1.6 cm with surrounding vasogenic edema.  CT of the chest showed a large left upper lobe lung mass.  Dr. Trenton Gammon did a craniotomy for resection on 04/30/2021.  She went to rehab afterwards.  She then had 3 treatments of stereotactic radiation.  PET/CT was recently done which showed hypermetabolic left upper lobe lung mass.  There is a 7 mm lesion in the right upper lobe that is too small to characterize.  There is no evidence of adenopathy or other metastases.  She currently is walking with a walker.  She still has weakness and foot drop of the left leg.  She is not having any chest pain, pressure, tightness, shortness of breath, cough, or wheezing.  She denies weight loss.  She does complain of decreased energy.  She also has had some swelling in her left leg which has been worse since she got home from rehab.  Zubrod Score: At the time of surgery this patient's most appropriate activity status/level should be described as: []     0    Normal activity, no symptoms [x]     1    Restricted in physical strenuous activity  but ambulatory, able to do out light work []     2    Ambulatory and capable of self care, unable to do work activities, up and about >50 % of waking hours                              []     3    Only limited self care, in bed greater than 50% of waking hours []     4    Completely disabled, no self care, confined to bed or chair []     5    Moribund  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[place])    S/p total hip replacement left   Breast cancer ([place]) 2015   left    Cataract    Cholecystolithiasis    Chronic allergic rhinitis    Depression    Former smoker, stopped smoking in distant past 10/05/2019   Quit 2000; 63 y smoking history   GERD (gastroesophageal reflux disease)    Hepatic steatosis 06/08/2020   Intermittent elevated LFTs and ultrasound 2018   Hyperlipemia    IBS (irritable bowel syndrome) 10/08/2018   Nl colonoscopy and EGD 09/2018   IBS (irritable bowel syndrome)    Lichen plano-pilaris    Major depression, chronic 09/05/2015  Neuropathy, peripheral, idiopathic    uses gabapentin   Osteopenia 09/07/2019   Dexa 08/2019: T = -1.2 at wrist; back and hips excluded (DJD and hardware).    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 Repeat 2020   Urinary tract infection    hx of frequent     Past Surgical History:  Procedure Laterality Date   APPENDECTOMY     APPLICATION OF CRANIAL NAVIGATION Right 04/30/2021   Procedure: APPLICATION OF CRANIAL NAVIGATION;  Surgeon: Earnie Larsson, MD;  Location: [facility];  Service: Neurosurgery;  Laterality: Right;   BREAST LUMPECTOMY Left 2015   cataracts     CHOLECYSTECTOMY N/A 10/29/2016   Procedure: LAPAROSCOPIC CHOLECYSTECTOMY;  Surgeon: Erroll Luna, MD;  Location: [place];  Service: General;  Laterality: N/A;   COLONOSCOPY     CRANIOTOMY Right 04/30/2021   Procedure: Right Parietal  Craniotomy for tumor with Brain Lab;  Surgeon: Earnie Larsson, MD;  Location: [place];  Service: Neurosurgery;  Laterality: Right;   LIPOSUCTION     TOTAL HIP ARTHROPLASTY     bilateral hip arthoplasty   TOTAL HIP ARTHROPLASTY Right 12/18/2015   Procedure: RIGHT TOTAL HIP ARTHROPLASTY ANTERIOR APPROACH;  Surgeon: Gaynelle Arabian, MD;  Location: [facility];  Service: Orthopedics;  Laterality: Right;   TOTAL KNEE ARTHROPLASTY     TOTAL KNEE ARTHROPLASTY Left 11/25/2016   Procedure: LEFT TOTAL KNEE ARTHROPLASTY;  Surgeon: Gaynelle Arabian, MD;  Location: [facility];  Service: Orthopedics;  Laterality: Left;  with abductor block   TUBAL LIGATION     UPPER GASTROINTESTINAL ENDOSCOPY      Family History  Problem Relation Age of Onset   Arthritis Mother    Diabetes Mother    Crohn's disease Mother    Arthritis Father    Lung cancer Father    Breast cancer Sister 53   Hyperlipidemia Maternal Aunt    Stomach cancer Maternal Grandmother    Colitis Neg Hx    Rectal cancer Neg Hx    Esophageal cancer Neg Hx     Social History Social History   Tobacco Use   Smoking status: Former    Packs/day: 1.00    Types: Cigarettes    Quit date: 11/22/1991    Years since quitting: 29.5   Smokeless tobacco: Never  Vaping Use   Vaping Use: Never used  Substance Use Topics   Alcohol use: Yes    Alcohol/week: 14.0 standard drinks    Types: 14 Glasses of wine per week   Drug use: No    Current Outpatient Medications  Medication Sig Dispense Refill   busPIRone (BUSPAR) 7.5 MG tablet Take 1 tablet (7.5 mg total) by mouth 2 (two) times daily as needed (anxiety). 30 tablet 0   dexamethasone (DECADRON) 2 MG tablet Take 1 tablet (2 mg total) by mouth every 12 (twelve) hours. 30 tablet 1   gabapentin (NEURONTIN) 300 MG capsule TAKE TWO CAPSULES BY MOUTH TWICE A DAY 360 capsule 3   rOPINIRole (REQUIP) 4 MG tablet Take 1 tablet (4 mg total) by mouth at bedtime. 30 tablet 0   No current facility-administered medications for  this visit.    Allergies  Allergen Reactions   Fluorescein Itching and Other (See Comments)   Contrast Media [Iodinated Diagnostic Agents] Hives   Hydromorphone Hcl Nausea Only   Other Rash    Certain Band-Aids don't agree with the patient's skin; hospital ID wristband  causes rashes   Sulfamethoxazole-Trimethoprim Hives   Fentanyl Other (See Comments)    Redness and flushing over body    Tape Rash    Certain Band-Aids don't agree with the patient's skin; hospital ID wristband causes rashes   Terbinafine And Related Hives and Rash    Review of Systems  Constitutional:  Positive for activity change and fatigue. Negative for unexpected weight change.  HENT:  Negative for trouble swallowing and voice change.   Respiratory:  Positive for apnea. Negative for cough, shortness of breath and wheezing.   Cardiovascular:  Positive for leg swelling. Negative for chest pain.  Musculoskeletal:  Positive for arthralgias, gait problem and joint swelling.  Neurological:  Positive for seizures, weakness and numbness.  Psychiatric/Behavioral:  Positive for dysphoric mood. The patient is nervous/anxious.   All other systems reviewed and are negative.  BP 119/73 (BP Location: Left Arm, Patient Position: Sitting, Cuff Size: Large)   Pulse 69   Resp 20   Ht 5\' 6"  (1.676 m)   Wt 203 lb (92.1 kg)   SpO2 94% Comment: RA  BMI 32.77 kg/m  Physical Exam Vitals reviewed.  Constitutional:      General: She is not in acute distress.    Appearance: Normal appearance.  HENT:     Head: Normocephalic and atraumatic.  Eyes:     General: No scleral icterus.    Extraocular Movements: Extraocular movements intact.  Neck:     Vascular: No carotid bruit.  Cardiovascular:     Rate and Rhythm: Normal rate and regular rhythm.     Pulses: Normal pulses.     Heart sounds: Normal heart sounds. No murmur heard.   No friction rub. No gallop.  Pulmonary:     Effort: Pulmonary effort is normal. No respiratory  distress.     Breath sounds: Normal breath sounds. No wheezing.  Abdominal:     General: There is no distension.     Palpations: Abdomen is soft.     Tenderness: There is no abdominal tenderness.  Musculoskeletal:     Cervical back: Neck supple.     Left lower leg: Edema (2+) present.  Lymphadenopathy:     Cervical: No cervical adenopathy.  Skin:    General: Skin is warm and dry.  Neurological:     Mental Status: She is alert and oriented to person, place, and time.     Motor: Weakness (Left leg weakness with foot drop) present.     Gait: Gait abnormal.  Psychiatric:        Mood and Affect: Mood normal.    Diagnostic Tests: NUCLEAR MEDICINE PET SKULL BASE TO THIGH   TECHNIQUE: 10.33 mCi F-18 FDG was injected intravenously. Full-ring PET imaging was performed from the skull base to thigh after the radiotracer. CT data was obtained and used for attenuation correction and anatomic localization.   Fasting blood glucose: 102 mg/dl   COMPARISON:  Chest CT 04/25/2021   FINDINGS: Mediastinal blood pool activity: SUV max 2.93   Liver activity: SUV max NA   NECK: No hypermetabolic lymph nodes in the neck.   Incidental CT findings: none   CHEST: The elongated 4.5 cm left upper lobe lung mass is hypermetabolic with SUV max of 40.81. This extends down to the left suprahilar region but I do not see any discrete hypermetabolic mediastinal or hilar lymph nodes.   Stable 7 mm right upper lobe pulmonary nodule. No definite hypermetabolism is demonstrated. No other pulmonary nodules are identified.  No supraclavicular or axillary adenopathy.  No breast masses.   Incidental CT findings: Stable coronary artery calcifications. Stable remote postoperative changes involving the medial left breast.   ABDOMEN/PELVIS: No abnormal hypermetabolic activity within the liver, pancreas, adrenal glands, or spleen. No hypermetabolic lymph nodes in the abdomen or pelvis.   Incidental CT  findings: Status post cholecystectomy. No biliary dilatation. Small calcified uterine fibroids are noted.   SKELETON: No findings suspicious for osseous metastatic disease. Artifact associated with bilateral hip prostheses.   Incidental CT findings: none   IMPRESSION: 1. Left upper lobe pulmonary lesion is hypermetabolic and consistent with primary lung neoplasm. 2. No enlarged or hypermetabolic mediastinal or hilar lymph nodes are identified. 3. Stable 7 mm right upper lobe pulmonary nodule. 4. No findings for abdominal/pelvic metastatic disease or osseous metastatic disease.     Electronically Signed   By: Marijo Sanes M.D.   On: 06/03/2021 16:28 I personally reviewed the PET/CT images.  There is a hypermetabolic left upper lobe mass with no hilar or mediastinal adenopathy and no evidence of distant metastatic disease.  Stable 7 mm right upper lobe lung nodule below the limit of PET resolution.  Impression: Tara Murphy is a 75 year old woman with a history of tobacco abuse (quit 2000), stage IV adenocarcinoma of the lung, brain metastasis, left leg weakness, left foot drop, hyperlipidemia, breast cancer, arthritis, total hip replacement, major depression, peripheral neuropathy, irritable bowel syndrome, and osteopenia.  She developed a left foot drop back in June.  She then developed peripheral neuropathy.  While waiting to see a neurologist she had a seizure.  CT of the head showed a solitary right frontal parietal tumor with vasogenic edema.  She had resection of the tumor followed by stereotactic radiation.  Pathology showed metastatic adenocarcinoma from the lung.  She has done well following her craniotomy.  She does still have a persistent foot drop and weakness in the left leg.  She is walking with a walker.  Work-up shows a 4.5 cm left upper lobe mass with no hilar or mediastinal adenopathy and no other evidence of distant disease.  Even though she has stage IV lung cancer  she does want to a subset of patients that have shown benefit with surgical resection of the primary lesion.  I discussed this with Mrs. Bal and her family.  They understand there is about a 25% chance of 5-year survival with surgical resection.  They understand that chemo, radiation, and immunotherapy are alternative treatments.  I do think she is a candidate for surgery.  She is not having any respiratory issues.  She has not yet had pulmonary function testing, which we need to do to confirm that she has adequate pulmonary reserve.  I discussed the proposed operation of robotic assisted left upper lobectomy with Mrs. Florentino and her family.  I informed them of the general nature of the procedure including the need for general anesthesia, the incisions to be used, the possible need for conversion, the use of a drainage tube postoperatively, the expected hospital stay, and the overall recovery.  I informed them of the indications, risks, benefits, and alternatives.  They understand the risks include, but not limited to death, MI, DVT, PE, bleeding, possible need for transfusion, infection, prolonged air leaks, cardiac arrhythmias, as well as the possibility of other unforeseeable complications.  Tobacco abuse-quit 25 years ago.  Coronary calcification on CT-no anginal symptoms.  No further work-up indicated at this time.  Plan: Pulmonary function testing Tentatively plan for  robotic assisted left upper lobectomy on Wednesday, 06/13/2021  Melrose Nakayama, MD [facility] [phone]

## 2021-06-05 ENCOUNTER — Encounter: Payer: Self-pay | Admitting: *Deleted

## 2021-06-05 ENCOUNTER — Ambulatory Visit (HOSPITAL_COMMUNITY)
Admission: RE | Admit: 2021-06-05 | Discharge: 2021-06-05 | Disposition: A | Discharge status: Home / Self Care | Payer: Medicare Other | Source: Ambulatory Visit | Attending: Thoracic Surgery (Cardiothoracic Vascular Surgery) | Admitting: Thoracic Surgery (Cardiothoracic Vascular Surgery)

## 2021-06-05 DIAGNOSIS — C3412 Malignant neoplasm of upper lobe, left bronchus or lung: Secondary | ICD-10-CM | POA: Diagnosis not present

## 2021-06-05 NOTE — Progress Notes (Signed)
Lower extremity venous has been completed.   Preliminary results in CV Proc.   Jinny Blossom Othman Masur 06/05/2021 12:57 PM

## 2021-06-06 ENCOUNTER — Other Ambulatory Visit: Payer: Self-pay

## 2021-06-06 ENCOUNTER — Inpatient Hospital Stay: Payer: Medicare Other | Attending: Hematology and Oncology | Admitting: Hematology and Oncology

## 2021-06-06 ENCOUNTER — Encounter: Payer: Self-pay | Admitting: Psychology

## 2021-06-06 VITALS — BP 156/73 | HR 90 | Temp 98.1°F | Resp 18

## 2021-06-06 DIAGNOSIS — Z881 Allergy status to other antibiotic agents status: Secondary | ICD-10-CM | POA: Insufficient documentation

## 2021-06-06 DIAGNOSIS — Z833 Family history of diabetes mellitus: Secondary | ICD-10-CM | POA: Insufficient documentation

## 2021-06-06 DIAGNOSIS — G936 Cerebral edema: Secondary | ICD-10-CM | POA: Diagnosis not present

## 2021-06-06 DIAGNOSIS — Z8379 Family history of other diseases of the digestive system: Secondary | ICD-10-CM | POA: Insufficient documentation

## 2021-06-06 DIAGNOSIS — Z800 Family history of malignant neoplasm of digestive organs: Secondary | ICD-10-CM | POA: Diagnosis not present

## 2021-06-06 DIAGNOSIS — Z803 Family history of malignant neoplasm of breast: Secondary | ICD-10-CM | POA: Diagnosis not present

## 2021-06-06 DIAGNOSIS — Z8261 Family history of arthritis: Secondary | ICD-10-CM | POA: Diagnosis not present

## 2021-06-06 DIAGNOSIS — Z801 Family history of malignant neoplasm of trachea, bronchus and lung: Secondary | ICD-10-CM | POA: Insufficient documentation

## 2021-06-06 DIAGNOSIS — C3492 Malignant neoplasm of unspecified part of left bronchus or lung: Secondary | ICD-10-CM | POA: Diagnosis not present

## 2021-06-06 DIAGNOSIS — Z8349 Family history of other endocrine, nutritional and metabolic diseases: Secondary | ICD-10-CM | POA: Insufficient documentation

## 2021-06-06 DIAGNOSIS — Z86010 Personal history of colonic polyps: Secondary | ICD-10-CM | POA: Insufficient documentation

## 2021-06-06 DIAGNOSIS — F32A Depression, unspecified: Secondary | ICD-10-CM | POA: Insufficient documentation

## 2021-06-06 DIAGNOSIS — Z853 Personal history of malignant neoplasm of breast: Secondary | ICD-10-CM | POA: Insufficient documentation

## 2021-06-06 DIAGNOSIS — D259 Leiomyoma of uterus, unspecified: Secondary | ICD-10-CM | POA: Insufficient documentation

## 2021-06-06 DIAGNOSIS — Z888 Allergy status to other drugs, medicaments and biological substances status: Secondary | ICD-10-CM | POA: Insufficient documentation

## 2021-06-06 DIAGNOSIS — J984 Other disorders of lung: Secondary | ICD-10-CM | POA: Insufficient documentation

## 2021-06-06 DIAGNOSIS — Z7952 Long term (current) use of systemic steroids: Secondary | ICD-10-CM | POA: Insufficient documentation

## 2021-06-06 DIAGNOSIS — Z87891 Personal history of nicotine dependence: Secondary | ICD-10-CM | POA: Insufficient documentation

## 2021-06-06 DIAGNOSIS — Z9049 Acquired absence of other specified parts of digestive tract: Secondary | ICD-10-CM | POA: Insufficient documentation

## 2021-06-06 DIAGNOSIS — Z7289 Other problems related to lifestyle: Secondary | ICD-10-CM | POA: Insufficient documentation

## 2021-06-06 DIAGNOSIS — C7931 Secondary malignant neoplasm of brain: Secondary | ICD-10-CM | POA: Insufficient documentation

## 2021-06-06 DIAGNOSIS — Z87440 Personal history of urinary (tract) infections: Secondary | ICD-10-CM | POA: Diagnosis not present

## 2021-06-06 DIAGNOSIS — Z86718 Personal history of other venous thrombosis and embolism: Secondary | ICD-10-CM | POA: Insufficient documentation

## 2021-06-06 DIAGNOSIS — C3411 Malignant neoplasm of upper lobe, right bronchus or lung: Secondary | ICD-10-CM | POA: Insufficient documentation

## 2021-06-06 DIAGNOSIS — Z885 Allergy status to narcotic agent status: Secondary | ICD-10-CM | POA: Insufficient documentation

## 2021-06-06 NOTE — Progress Notes (Signed)
[facility] CONSULT NOTE  Patient Care Team: Leamon Arnt, MD as PCP - General (Family Medicine) Loletha Carrow, Kirke Corin, MD as Consulting Physician (Gastroenterology) Ulla Gallo, MD as Consulting Physician (Dermatology) Wylene Simmer, MD as Consulting Physician (Orthopedic Surgery) Alda Berthold, DO as Consulting Physician (Neurology)  CHIEF COMPLAINTS/PURPOSE OF CONSULTATION:  Adenocarcinoma lung  ASSESSMENT & PLAN:   This is a very pleasant 75 year old female patient with past medical history significant for left breast IDC grade 1, intermediate grade DCIS, negative margins, 0 out of 2 sentinel lymph nodes, pathologic staging T1CN0 status post surgery, ER/PR positive but could not tolerate antiestrogen therapy because of severe emotional changes who presented with seizure-like activity and left upper and left lower extremity weakness to the hospital.    She had imaging which showed 2 x 2 centimeter hemorrhagic posterior right frontal lobe mass and associated vasogenic edema.  She also had systemic imaging showing large spiculated left upper lobe suprahilar mass most consistent with primary bronchogenic malignancy as well as an indeterminate 8 mm right upper lobe pulmonary nodule.  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  She has met Dr. Tammi Klippel for consideration of adjuvant radiation after surgery.  She will be presented in the lung tumor board however I agree with outpatient PET/CT although I wonder if the 8 mm right lung nodule may not appear active on the PET given its size under 1 cm.  We have initially discussed about considering neoadjuvant targeted therapy/immunotherapy/chemotherapy based on foundation 1 testing and reevaluating in 3 months with repeat imaging.  If she has no evidence of distant disease on PET CT, we can also consider  upfront surgical resection and monitor the RUL nodule with serial imaging PET scheduled on 9/9, will discuss final plan after PET imaging.  PET scan showed left upper lobe pulmonary lesion which is hypermetabolic and consistent with primary lung neoplasm.  No enlarged or hypermetabolic mediastinal or hilar lymph nodes.  Stable 7 mm right upper lobe pulmonary nodule.  No findings abdominal/pelvic metastatic disease or osseous metastatic disease.  She is scheduled for surgery with Dr. Roxan Hockey on June 13, 2021.  She will also continue to follow-up with radiation oncology.  We have discussed about multiple options for surgery today.  We have discussed about continuing surveillance and following the right upper lobe nodule and treating if there is evidence of progression.  We have also discussed that considering immunotherapy which was recommended in the lung tumor board for about 2years. We today again discussed mechanism of action of immunotherapy, adverse effects of immunotherapy including but not limited to fatigue, dry skin, hypo/hyper thyroidism, hepatitis, nephritis, hypophysitis, myocarditis.  She understands that immunotherapy can virtually affect any organ but severe adverse effects are really uncommon with immunotherapy ranging about 2 to 5%.  In the lung tumor, there was also concern considering adjuvant chemotherapy.  However given the metastatic disease at presentation with brain mets, discussed mostly about surveillance versus considering immunotherapy.  Patient is also very reluctant to even consider chemotherapy in the situation. She will return to clinic in few weeks after his surgery and recovery to consider initiation of systemic therapy.  She was leaning towards considering immunotherapy for a couple years, she understands the side effects can be occasionally fatal.  She is reluctant to proceed with surveillance with imaging every 3 months at this time.  HISTORY OF  PRESENTING ILLNESS:   Tara Murphy 75 y.o. female is here because of new diagnosis of adenocarcinoma of the lung.  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  She apparently started having some uncoordinated uncontrolled jerking movements of the left lower and left upper extremity witnessed by her husband. CT head shows a brain tumor 2 x 2 centimeter hemorrhagic posterior right frontal lobe mass and associated vasogenic edema. Also had CT chest abdomen pelvis which showed large spiculated left upper lobe suprahilar mass most consistent with primary bronchogenic malignancy.  Indeterminate 8 mm right upper lobe pulmonary nodule metastatic disease cannot be excluded, attention on follow-up recommended.  Medical oncology was consulted for additional recommendations. MR brain showed hemorrhagic lesion of the paramedian superior right frontal lobe with moderate surrounding vasogenic edema. We discussed that resection of the brain tumor will address the current clinical symptoms as well as establish the diagnosis.  She proceeded with bilateral parietal craniotomy with resection of brain tumor by Dr. Trenton Gammon on 04/30/2021.  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 Foundation 1 testing showed a TPS score of 90% and no other targetable mutations. She had SRS to the brain lesion by Dr. Tammi Klippel. Most recent PET/CT showed no evidence of metastasis.  It  showed a hypermetabolic left upper lobe lung mass, 7 mm lesion in the right upper lobe is too small to characterize. She is followed up with thoracic surgery and is planned for left upper lobectomy 06/13/2021. She was clearly informed about her 5-year chance of survival with surgical resection and alternatives for this procedure when she met Dr. Roxan Hockey. Today she is here with her husband and her daughter.  She is in a wheelchair.  She appears to be doing well.  She is very happy with the surgical option.  She would like to consider adjuvant immunotherapy after the surgery.  She denies any new health complaints for me.  She continues to have left leg weakness.  She is however trying to do more things and stay active and gained some strength.  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) 2015   left    Cataract    Cholecystolithiasis    Chronic allergic rhinitis    Depression    Former smoker, stopped smoking in distant past 10/05/2019   Quit 2000; 35 y smoking history   GERD (gastroesophageal reflux disease)    Hepatic steatosis 06/08/2020   Intermittent elevated LFTs and ultrasound 2018   Hyperlipemia    IBS (irritable bowel syndrome) 10/08/2018   Nl colonoscopy and EGD 09/2018   IBS (irritable bowel syndrome)    Lichen plano-pilaris    Major depression, 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 Repeat 2020   Urinary tract infection    hx of frequent     SURGICAL HISTORY: Past Surgical History:  Procedure Laterality Date   APPENDECTOMY     APPLICATION OF CRANIAL  NAVIGATION Right 04/30/2021   Procedure: APPLICATION OF CRANIAL NAVIGATION;  Surgeon: Earnie Larsson, MD;  Location: [facility];  Service: Neurosurgery;  Laterality: Right;   BREAST LUMPECTOMY Left 2015   cataracts     CHOLECYSTECTOMY N/A 10/29/2016   Procedure: LAPAROSCOPIC CHOLECYSTECTOMY;  Surgeon: Erroll Luna, MD;  Location: [facility];  Service: General;  Laterality: N/A;   COLONOSCOPY     CRANIOTOMY Right 04/30/2021   Procedure: Right Parietal Craniotomy for tumor with Brain Lab;  Surgeon: Earnie Larsson, MD;  Location: [facility];  Service: Neurosurgery;  Laterality: Right;   LIPOSUCTION     TOTAL HIP ARTHROPLASTY     bilateral hip arthoplasty   TOTAL HIP ARTHROPLASTY Right 12/18/2015   Procedure: RIGHT TOTAL HIP ARTHROPLASTY ANTERIOR APPROACH;  Surgeon: Gaynelle Arabian, MD;  Location: [facility];  Service: Orthopedics;  Laterality: Right;   TOTAL KNEE ARTHROPLASTY     TOTAL KNEE ARTHROPLASTY Left 11/25/2016   Procedure: LEFT TOTAL KNEE ARTHROPLASTY;  Surgeon: Gaynelle Arabian, MD;  Location: [facility];  Service: Orthopedics;  Laterality: Left;  with abductor block   TUBAL LIGATION     UPPER GASTROINTESTINAL ENDOSCOPY      SOCIAL HISTORY: Social History   Socioeconomic History   Marital status: Married    Spouse name: Not on file   Number of children: 2   Years of education: Not on file   Highest education level: Not on file  Occupational History   Occupation: Retired     Comment: Oceanographer   Tobacco Use   Smoking status: Former    Packs/day: 1.00    Types: Cigarettes    Quit date: 11/22/1991    Years since quitting: 29.5   Smokeless tobacco: Never  Vaping Use   Vaping Use: Never used  Substance and Sexual Activity   Alcohol use: Yes    Alcohol/week: 14.0 standard drinks    Types: 14 Glasses of wine per week   Drug use: No   Sexual activity: Not Currently    Birth control/protection: Post-menopausal  Other Topics Concern   Not on file  Social History Narrative   4 grandchildren and 3 step     Enjoys painting, and gardening, and home decorating    Right Handed   Has a [place] Puppy    Lives one story home    Social Determinants of Health   Financial Resource Strain: Not on file  Food Insecurity: Not on file  Transportation Needs: Not on file  Physical Activity: Not on file  Stress: Not on file  Social Connections: Not on file  Intimate Partner Violence: Not on file    FAMILY HISTORY: Family History  Problem Relation Age of Onset   Arthritis Mother    Diabetes Mother    Crohn's disease Mother    Arthritis Father    Lung cancer Father    Breast cancer Sister 6   Hyperlipidemia Maternal Aunt    Stomach cancer Maternal Grandmother    Colitis Neg Hx    Rectal cancer Neg Hx    Esophageal cancer Neg Hx     ALLERGIES:  is allergic to fluorescein, contrast media [iodinated diagnostic agents],  hydromorphone hcl, other, sulfamethoxazole-trimethoprim, fentanyl, tape, and terbinafine and related.  MEDICATIONS:  Current Outpatient Medications  Medication Sig Dispense Refill   busPIRone (BUSPAR) 7.5 MG tablet Take 1 tablet (7.5 mg total) by mouth 2 (two) times daily as needed (anxiety). 30 tablet 0   clobetasol (TEMOVATE) 0.05 % external solution Apply 1 application topically daily as needed (irritation).     dexamethasone (DECADRON) 2 MG tablet Take 1 tablet (2 mg total) by mouth every 12 (twelve) hours. 30 tablet 1   fluticasone (FLONASE) 50 MCG/ACT nasal spray Place 1 spray into both nostrils daily as needed for allergies or rhinitis.     gabapentin (NEURONTIN) 300 MG capsule TAKE TWO CAPSULES BY MOUTH TWICE A DAY (Patient taking differently: Take 600 mg by mouth at bedtime.) 360 capsule 3   rOPINIRole (REQUIP) 4 MG tablet Take 1 tablet (4 mg total) by mouth at bedtime. 30 tablet 0   No current facility-administered medications for this visit.     PHYSICAL EXAMINATION: ECOG PERFORMANCE STATUS: 2 - Symptomatic, <50% confined to bed  Vitals:   06/06/21 1430  BP:  (!) 156/73  Pulse: 90  Resp: 18  Temp: 98.1 F (36.7 C)  SpO2: 94%   Filed Weights   Physical exam deferred today in lieu of counseling.  LABORATORY DATA:  I have reviewed the data as listed Lab Results  Component Value Date   WBC 10.7 (H) 05/04/2021   HGB 12.9 05/04/2021   HCT 39.4 05/04/2021   MCV 92.5 05/04/2021   PLT 308 05/04/2021     Chemistry      Component Value Date/Time   NA 137 05/04/2021 0543   K 4.0 05/04/2021 0543   CL 101 05/04/2021 0543   CO2 25 05/04/2021 0543   BUN 14 05/04/2021 0543   CREATININE 0.52 05/04/2021 0543   CREATININE 0.69 06/07/2020 1410      Component Value Date/Time   CALCIUM 9.0 05/04/2021 0543   ALKPHOS 81 05/04/2021 0543   AST 34 05/04/2021 0543   ALT 66 (H) 05/04/2021 0543   BILITOT 0.4 05/04/2021 0543       RADIOGRAPHIC STUDIES: I have personally reviewed the radiological images as listed and agreed with the findings in the report. MR Brain W Wo Contrast  Result Date: 05/24/2021 CLINICAL DATA:  Metastatic lung cancer post surgery, radiation treatment planning EXAM: MRI HEAD WITHOUT AND WITH CONTRAST TECHNIQUE: Multiplanar, multiecho pulse sequences of the brain and surrounding structures were obtained without and with intravenous contrast. CONTRAST:  22m MULTIHANCE GADOBENATE DIMEGLUMINE 529 MG/ML IV SOLN COMPARISON:  05/01/2021 FINDINGS: Brain: Postoperative changes are present in parasagittal right frontal lobe involving the precentral gyrus. There is new thick enhancement at the periphery. Expected blood products are present at the surgical cavity and the surgical tract. Surrounding edema has decreased. No new mass or abnormal enhancement remote this site. Ventricles and sulci are normal in size and configuration. Additional few patchy foci of T2 hyperintensity in the supratentorial white matter are nonspecific but may reflect chronic microvascular ischemic changes. Vascular: Major vessel flow voids at the skull base are  preserved. Skull and upper cervical spine: Normal marrow signal is preserved. Sinuses/Orbits: Paranasal sinuses are aerated. Bilateral lens replacements. Other: Sella is unremarkable.  Mastoid air cells are clear. IMPRESSION: New thick enhancement at the periphery of the parasagittal right precentral gyrus surgical cavity that may reflect recurrent disease. Surrounding edema has decreased. Electronically Signed   By: PMacy MisM.D.   On: 05/24/2021 08:42  NM PET Image Initial (PI) Skull Base To Thigh  Result Date: 06/03/2021 CLINICAL DATA:  Initial treatment strategy for metastatic lung cancer. Patient presented with a brain mass. Chest CT showed a spiculated left upper lobe lung mass. EXAM: NUCLEAR MEDICINE PET SKULL BASE TO THIGH TECHNIQUE: 10.33 mCi F-18 FDG was injected intravenously. Full-ring PET imaging was performed from the skull base to thigh after the radiotracer. CT data was obtained and used for attenuation correction and anatomic localization. Fasting blood glucose: 102 mg/dl COMPARISON:  Chest CT 04/25/2021 FINDINGS: Mediastinal blood pool activity: SUV max 2.93 Liver activity: SUV max NA NECK: No hypermetabolic lymph nodes in the neck. Incidental CT findings: none CHEST: The elongated 4.5 cm left upper lobe lung mass is hypermetabolic with SUV max of 51.10. This extends down to the left suprahilar region but I do not see any discrete hypermetabolic mediastinal or hilar lymph nodes. Stable 7 mm right upper lobe pulmonary nodule. No definite hypermetabolism is demonstrated. No other pulmonary nodules are identified. No supraclavicular or axillary adenopathy.  No breast masses. Incidental CT findings: Stable coronary artery calcifications. Stable remote postoperative changes involving the medial left breast. ABDOMEN/PELVIS: No abnormal hypermetabolic activity within the liver, pancreas, adrenal glands, or spleen. No hypermetabolic lymph nodes in the abdomen or pelvis. Incidental CT findings:  Status post cholecystectomy. No biliary dilatation. Small calcified uterine fibroids are noted. SKELETON: No findings suspicious for osseous metastatic disease. Artifact associated with bilateral hip prostheses. Incidental CT findings: none IMPRESSION: 1. Left upper lobe pulmonary lesion is hypermetabolic and consistent with primary lung neoplasm. 2. No enlarged or hypermetabolic mediastinal or hilar lymph nodes are identified. 3. Stable 7 mm right upper lobe pulmonary nodule. 4. No findings for abdominal/pelvic metastatic disease or osseous metastatic disease. Electronically Signed   By: Marijo Sanes M.D.   On: 06/03/2021 16:28   VAS [name] LOWER EXTREMITY VENOUS (DVT)  Result Date: 06/05/2021  Lower Venous DVT Study Patient Name:  SHABNAM LADD  Date of Exam:   06/05/2021 Medical Rec #: [id number]       Accession #:    [id number] Date of Birth: 10/17/1945        Patient Gender: F Patient Age:   26 years Exam Location:  [facility] Procedure:      VAS [name] LOWER EXTREMITY VENOUS (DVT) Referring Phys: Remo Lipps HENDRICKSON --------------------------------------------------------------------------------  Indications: Pain.  Comparison Study: no prior Performing Technologist: Archie Patten RVS  Examination Guidelines: A complete evaluation includes B-mode imaging, spectral Doppler, color Doppler, and power Doppler as needed of all accessible portions of each vessel. Bilateral testing is considered an integral part of a complete examination. Limited examinations for reoccurring indications may be performed as noted. The reflux portion of the exam is performed with the patient in reverse Trendelenburg.  +-----+---------------+---------+-----------+----------+--------------+ RIGHTCompressibilityPhasicitySpontaneityPropertiesThrombus Aging +-----+---------------+---------+-----------+----------+--------------+ CFV  Full           Yes      Yes                                  +-----+---------------+---------+-----------+----------+--------------+   +---------+---------------+---------+-----------+----------+--------------+ LEFT     CompressibilityPhasicitySpontaneityPropertiesThrombus Aging +---------+---------------+---------+-----------+----------+--------------+ CFV      Full           Yes      Yes                                 +---------+---------------+---------+-----------+----------+--------------+  SFJ      Full                                                        +---------+---------------+---------+-----------+----------+--------------+ FV Prox  Full                                                        +---------+---------------+---------+-----------+----------+--------------+ FV Mid   Full                                                        +---------+---------------+---------+-----------+----------+--------------+ FV DistalFull                                                        +---------+---------------+---------+-----------+----------+--------------+ PFV      Full                                                        +---------+---------------+---------+-----------+----------+--------------+ POP      Full           Yes      Yes                                 +---------+---------------+---------+-----------+----------+--------------+ PTV      Full                                                        +---------+---------------+---------+-----------+----------+--------------+ PERO     Full                                                        +---------+---------------+---------+-----------+----------+--------------+     Summary: RIGHT: - No evidence of common femoral vein obstruction.  LEFT: - No evidence of common femoral vein obstruction. - There is no evidence of deep vein thrombosis in the lower extremity.  - No cystic structure found in the popliteal fossa.  *See table(s) above for  measurements and observations. Electronically signed by Harold Barban MD on 06/05/2021 at 7:12:45 PM.    Final     I have reviewed all pertinent imaging and pathology reports.  I reviewed the PET images today with the patient and family.  We have discussed options for adjuvant therapy which was her main focus for the appointment today.  She would like to proceed with left upper lobectomy as scheduled and consider adjuvant immunotherapy.  We have discussed again about mechanism of action of immunotherapy, adverse effects of immunotherapy and recommended duration as well as surveillance recommendations.  She will return to clinic after surgery and will allow about 3 to 4 weeks of recovery before we initiate systemic therapy. I easily spent more than 30 minutes including history, physical, reviewing her records including coordinating her care and discussing all options as mentioned above.   Benay Pike, MD 06/06/2021 2:58 PM

## 2021-06-07 ENCOUNTER — Encounter: Payer: Self-pay | Admitting: Hematology and Oncology

## 2021-06-08 NOTE — Pre-Procedure Instructions (Signed)
Surgical Instructions    Your procedure is scheduled on Wednesday 06/13/21.   Report to [facility] Main Entrance "A" at 08:00 A.M., then check in with the Admitting office.  Call this number if you have problems the morning of surgery:  [phone]   If you have any questions prior to your surgery date call [phone]: Open Monday-Friday 8am-4pm    Remember:  Do not eat or drink after midnight the night before your surgery   Take these medicines the morning of surgery with A SIP OF WATER   dexamethasone (DECADRON)    Take these medicines if needed:   busPIRone (BUSPAR)  fluticasone (FLONASE)    As of today, STOP taking any Aspirin (unless otherwise instructed by your surgeon) Aleve, Naproxen, Ibuprofen, Motrin, Advil, Goody's, BC's, all herbal medications, fish oil, and all vitamins.          Do not wear jewelry or makeup Do not wear lotions, powders, perfumes/colognes, or deodorant. Do not shave 48 hours prior to surgery.  Men may shave face and neck. Do not bring valuables to the hospital. DO Not wear nail polish, gel polish, artificial nails, or any other type of covering on natural nails including finger and toenails. If patients have artificial nails, gel coating, etc. that need to be removed by a nail salon please have this removed prior to surgery or surgery may need to be canceled/delayed if the surgeon/ anesthesia feels like the patient is unable to be adequately monitored.             [facility] is not responsible for any belongings or valuables.  Do NOT Smoke (Tobacco/Vaping)  24 hours prior to your procedure If you use a CPAP at night, you may bring your mask for your overnight stay.   Contacts, glasses, dentures or bridgework may not be worn into surgery, please bring cases for these belongings   For patients admitted to the hospital, discharge time will be determined by your treatment team.   Patients discharged the day of surgery will not be allowed to  drive home, and someone needs to stay with them for 24 hours.  NO VISITORS WILL BE ALLOWED IN PRE-OP WHERE PATIENTS GET READY FOR SURGERY.  ONLY 1 SUPPORT PERSON MAY BE PRESENT WHILE YOU ARE IN SURGERY.  IF YOU ARE TO BE ADMITTED, ONCE YOU ARE IN YOUR ROOM YOU WILL BE ALLOWED TWO (2) VISITORS.  Minor children may have two parents present. Special consideration for safety and communication needs will be reviewed on a case by case basis.  Special instructions:    Oral Hygiene is also important to reduce your risk of infection.  Remember - BRUSH YOUR TEETH THE MORNING OF SURGERY WITH YOUR REGULAR TOOTHPASTE   - Preparing For Surgery  Before surgery, you can play an important role. Because skin is not sterile, your skin needs to be as free of germs as possible. You can reduce the number of germs on your skin by washing with CHG (chlorahexidine gluconate) Soap before surgery.  CHG is an antiseptic cleaner which kills germs and bonds with the skin to continue killing germs even after washing.     Please do not use if you have an allergy to CHG or antibacterial soaps. If your skin becomes reddened/irritated stop using the CHG.  Do not shave (including legs and underarms) for at least 48 hours prior to first CHG shower. It is OK to shave your face.  Please follow these instructions carefully.  Shower the NIGHT BEFORE SURGERY and the MORNING OF SURGERY with CHG Soap.   If you chose to wash your hair, wash your hair first as usual with your normal shampoo. After you shampoo, rinse your hair and body thoroughly to remove the shampoo.  Then [employer] and genitals (private parts) with your normal soap and rinse thoroughly to remove soap.  After that Use CHG Soap as you would any other liquid soap. You can apply CHG directly to the skin and wash gently with a scrungie or a clean washcloth.   Apply the CHG Soap to your body ONLY FROM THE NECK DOWN.  Do not use on open wounds or open sores.  Avoid contact with your eyes, ears, mouth and genitals (private parts). Wash Face and genitals (private parts)  with your normal soap.   Wash thoroughly, paying special attention to the area where your surgery will be performed.  Thoroughly rinse your body with warm water from the neck down.  DO NOT shower/wash with your normal soap after using and rinsing off the CHG Soap.  Pat yourself dry with a CLEAN TOWEL.  Wear CLEAN PAJAMAS to bed the night before surgery  Place CLEAN SHEETS on your bed the night before your surgery  DO NOT SLEEP WITH PETS.   Day of Surgery:  Take a shower with CHG soap. Wear Clean/Comfortable clothing the morning of surgery Do not apply any deodorants/lotions.   Remember to brush your teeth WITH YOUR REGULAR TOOTHPASTE.   Please read over the following fact sheets that you were given.

## 2021-06-11 ENCOUNTER — Encounter (HOSPITAL_COMMUNITY): Payer: Self-pay

## 2021-06-11 ENCOUNTER — Encounter (HOSPITAL_COMMUNITY)
Admission: RE | Admit: 2021-06-11 | Discharge: 2021-06-11 | Disposition: A | Discharge status: Home / Self Care | Payer: Medicare Other | Source: Ambulatory Visit | Attending: Thoracic Surgery (Cardiothoracic Vascular Surgery) | Admitting: Thoracic Surgery (Cardiothoracic Vascular Surgery)

## 2021-06-11 ENCOUNTER — Ambulatory Visit (HOSPITAL_COMMUNITY)
Admission: RE | Admit: 2021-06-11 | Discharge: 2021-06-11 | Disposition: A | Discharge status: Home / Self Care | Payer: Medicare Other | Source: Ambulatory Visit | Attending: Thoracic Surgery (Cardiothoracic Vascular Surgery) | Admitting: Thoracic Surgery (Cardiothoracic Vascular Surgery)

## 2021-06-11 ENCOUNTER — Other Ambulatory Visit: Payer: Self-pay

## 2021-06-11 DIAGNOSIS — Z79899 Other long term (current) drug therapy: Secondary | ICD-10-CM | POA: Insufficient documentation

## 2021-06-11 DIAGNOSIS — I69354 Hemiplegia and hemiparesis following cerebral infarction affecting left non-dominant side: Secondary | ICD-10-CM | POA: Diagnosis not present

## 2021-06-11 DIAGNOSIS — Z7952 Long term (current) use of systemic steroids: Secondary | ICD-10-CM | POA: Insufficient documentation

## 2021-06-11 DIAGNOSIS — Z20822 Contact with and (suspected) exposure to covid-19: Secondary | ICD-10-CM | POA: Insufficient documentation

## 2021-06-11 DIAGNOSIS — D62 Acute posthemorrhagic anemia: Secondary | ICD-10-CM | POA: Diagnosis not present

## 2021-06-11 DIAGNOSIS — Z01818 Encounter for other preprocedural examination: Secondary | ICD-10-CM | POA: Insufficient documentation

## 2021-06-11 DIAGNOSIS — M21372 Foot drop, left foot: Secondary | ICD-10-CM | POA: Insufficient documentation

## 2021-06-11 DIAGNOSIS — G479 Sleep disorder, unspecified: Secondary | ICD-10-CM | POA: Diagnosis not present

## 2021-06-11 DIAGNOSIS — G2581 Restless legs syndrome: Secondary | ICD-10-CM | POA: Diagnosis not present

## 2021-06-11 DIAGNOSIS — F329 Major depressive disorder, single episode, unspecified: Secondary | ICD-10-CM | POA: Diagnosis not present

## 2021-06-11 DIAGNOSIS — C3412 Malignant neoplasm of upper lobe, left bronchus or lung: Secondary | ICD-10-CM | POA: Insufficient documentation

## 2021-06-11 DIAGNOSIS — E669 Obesity, unspecified: Secondary | ICD-10-CM | POA: Diagnosis not present

## 2021-06-11 DIAGNOSIS — C7931 Secondary malignant neoplasm of brain: Secondary | ICD-10-CM | POA: Insufficient documentation

## 2021-06-11 DIAGNOSIS — Z6833 Body mass index (BMI) 33.0-33.9, adult: Secondary | ICD-10-CM | POA: Diagnosis not present

## 2021-06-11 DIAGNOSIS — J939 Pneumothorax, unspecified: Secondary | ICD-10-CM | POA: Diagnosis not present

## 2021-06-11 DIAGNOSIS — R569 Unspecified convulsions: Secondary | ICD-10-CM | POA: Diagnosis not present

## 2021-06-11 HISTORY — DX: Atherosclerotic heart disease of native coronary artery without angina pectoris: I25.10

## 2021-06-11 LAB — SURGICAL PCR SCREEN
MRSA, PCR: NEGATIVE
Staphylococcus aureus: NEGATIVE

## 2021-06-11 LAB — TYPE AND SCREEN
ABO/RH(D): O POS
Antibody Screen: NEGATIVE

## 2021-06-11 LAB — URINALYSIS, MICROSCOPIC (REFLEX): RBC / HPF: NONE SEEN RBC/hpf (ref 0–5)

## 2021-06-11 LAB — CBC
HCT: 41.3 % (ref 36.0–46.0)
Hemoglobin: 14.2 g/dL (ref 12.0–15.0)
MCH: 31.4 pg (ref 26.0–34.0)
MCHC: 34.4 g/dL (ref 30.0–36.0)
MCV: 91.4 fL (ref 80.0–100.0)
Platelets: 253 10*3/uL (ref 150–400)
RBC: 4.52 MIL/uL (ref 3.87–5.11)
RDW: 13.7 % (ref 11.5–15.5)
WBC: 10.4 10*3/uL (ref 4.0–10.5)
nRBC: 0 % (ref 0.0–0.2)

## 2021-06-11 LAB — PROTIME-INR
INR: 1 (ref 0.8–1.2)
Prothrombin Time: 13.3 seconds (ref 11.4–15.2)

## 2021-06-11 LAB — COMPREHENSIVE METABOLIC PANEL
ALT: 39 U/L (ref 0–44)
AST: 22 U/L (ref 15–41)
Albumin: 3.6 g/dL (ref 3.5–5.0)
Alkaline Phosphatase: 72 U/L (ref 38–126)
Anion gap: 12 (ref 5–15)
BUN: 14 mg/dL (ref 8–23)
CO2: 20 mmol/L — ABNORMAL LOW (ref 22–32)
Calcium: 8.7 mg/dL — ABNORMAL LOW (ref 8.9–10.3)
Chloride: 102 mmol/L (ref 98–111)
Creatinine, Ser: 0.51 mg/dL (ref 0.44–1.00)
GFR, Estimated: >60 mL/min (ref >60)
Glucose, Bld: 107 mg/dL — ABNORMAL HIGH (ref 70–99)
Potassium: 4.1 mmol/L (ref 3.5–5.1)
Sodium: 134 mmol/L — ABNORMAL LOW (ref 135–145)
Total Bilirubin: 0.9 mg/dL (ref 0.3–1.2)
Total Protein: 6 g/dL — ABNORMAL LOW (ref 6.5–8.1)

## 2021-06-11 LAB — URINALYSIS, ROUTINE W REFLEX MICROSCOPIC
Bilirubin Urine: NEGATIVE
Glucose, UA: NEGATIVE mg/dL
Hgb urine dipstick: NEGATIVE
Ketones, ur: NEGATIVE mg/dL
Nitrite: NEGATIVE
Protein, ur: NEGATIVE mg/dL
Specific Gravity, Urine: 1.015 (ref 1.005–1.030)
pH: 6.5 (ref 5.0–8.0)

## 2021-06-11 LAB — BLOOD GAS, ARTERIAL
Acid-base deficit: 1.7 mmol/L (ref 0.0–2.0)
Bicarbonate: 21.5 mmol/L (ref 20.0–28.0)
Drawn by: 58793
FIO2: 21
O2 Saturation: 98.5 %
Patient temperature: 37
pCO2 arterial: 30.3 mmHg — ABNORMAL LOW (ref 32.0–48.0)
pH, Arterial: 7.464 — ABNORMAL HIGH (ref 7.350–7.450)
pO2, Arterial: 107 mmHg (ref 83.0–108.0)

## 2021-06-11 LAB — APTT: aPTT: 24 seconds (ref 24–36)

## 2021-06-11 LAB — SARS CORONAVIRUS 2 (TAT 6-24 HRS): SARS Coronavirus 2: NEGATIVE

## 2021-06-11 NOTE — Progress Notes (Signed)
Levonne Spiller, RN w/ Dr. Roxan Hockey notified about abnormal UA

## 2021-06-11 NOTE — Progress Notes (Addendum)
PCP - Billey Chang, MD Cardiologist - Denies Oncology- Tyler Pita, MD; Benay Pike, MD  PPM/ICD - Denies  Chest x-ray - 06/11/21 EKG - 06/11/21 Stress Test - Per pt done > 5 years ago, results were negative. ECHO - 11/25/13- Care Everywhere Cardiac Cath - Denies  Sleep Study - Denies CPAP - N/A  DM- Denies  Blood Thinner Instructions: N/A Aspirin Instructions:N/A  ERAS Protcol - Not ordered PRE-SURGERY Ensure or G2- N/A  COVID TEST- 06/11/21; results pending   Anesthesia review: Yes, neuro & cardiac hx  Patient denies shortness of breath, fever, cough and chest pain at PAT appointment   All instructions explained to the patient, with a verbal understanding of the material. Patient agrees to go over the instructions while at home for a better understanding. The opportunity to ask questions was provided.

## 2021-06-12 ENCOUNTER — Ambulatory Visit (HOSPITAL_COMMUNITY)
Admission: RE | Admit: 2021-06-12 | Discharge: 2021-06-12 | Disposition: A | Discharge status: Home / Self Care | Payer: Medicare Other | Source: Ambulatory Visit | Attending: Thoracic Surgery (Cardiothoracic Vascular Surgery) | Admitting: Thoracic Surgery (Cardiothoracic Vascular Surgery)

## 2021-06-12 ENCOUNTER — Encounter (HOSPITAL_COMMUNITY): Payer: Self-pay

## 2021-06-12 ENCOUNTER — Telehealth: Payer: Self-pay

## 2021-06-12 DIAGNOSIS — C3412 Malignant neoplasm of upper lobe, left bronchus or lung: Secondary | ICD-10-CM | POA: Insufficient documentation

## 2021-06-12 LAB — PULMONARY FUNCTION TEST
DL/VA % pred: 114 %
DL/VA: 4.6 ml/min/mmHg/L
DLCO cor % pred: 77 %
DLCO cor: 16.33 ml/min/mmHg
DLCO unc % pred: 79 %
DLCO unc: 16.71 ml/min/mmHg
FEF 25-75 Post: 2.1 L/sec
FEF 25-75 Pre: 1.9 L/sec
FEF2575-%Change-Post: 10 %
FEF2575-%Pred-Post: 112 %
FEF2575-%Pred-Pre: 101 %
FEV1-%Change-Post: 1 %
FEV1-%Pred-Post: 76 %
FEV1-%Pred-Pre: 74 %
FEV1-Post: 1.84 L
FEV1-Pre: 1.8 L
FEV1FVC-%Change-Post: 2 %
FEV1FVC-%Pred-Pre: 108 %
FEV6-%Change-Post: -1 %
FEV6-%Pred-Post: 71 %
FEV6-%Pred-Pre: 72 %
FEV6-Post: 2.19 L
FEV6-Pre: 2.22 L
FEV6FVC-%Change-Post: 0 %
FEV6FVC-%Pred-Post: 104 %
FEV6FVC-%Pred-Pre: 104 %
FVC-%Change-Post: 0 %
FVC-%Pred-Post: 68 %
FVC-%Pred-Pre: 69 %
FVC-Post: 2.2 L
FVC-Pre: 2.22 L
Post FEV1/FVC ratio: 84 %
Post FEV6/FVC ratio: 100 %
Pre FEV1/FVC ratio: 81 %
Pre FEV6/FVC Ratio: 100 %
RV % pred: 73 %
RV: 1.78 L
TLC % pred: 80 %
TLC: 4.39 L

## 2021-06-12 MED ORDER — ALBUTEROL SULFATE (2.5 MG/3ML) 0.083% IN NEBU
2.5000 mg | INHALATION_SOLUTION | Freq: Once | RESPIRATORY_TRACT | Status: AC
  Administered 2021-06-12: 2.5 mg via RESPIRATORY_TRACT

## 2021-06-12 MED ORDER — CIPROFLOXACIN HCL 500 MG PO TABS: 500.0000 mg | ORAL_TABLET | Freq: Two times a day (BID) | ORAL | 0 refills | Status: DC

## 2021-06-12 NOTE — Telephone Encounter (Signed)
Called patient and left message for return call. Will advise to pick up RX for Ciprofloxacin 500mg  BID PO x3 doses before surgery tomorrow with Dr. Roxan Hockey. RX sent into the [facility] on [address]. Will await return call and attempt to call back again.

## 2021-06-12 NOTE — Telephone Encounter (Signed)
-----   Message from Laury Deep, RN sent at 06/11/2021  9:05 PM EDT ----- Hey girls, see below per [facility]  Can one of you please call her in a few doses?  Thanks,  Ryan ----- Message ----- From: Melrose Nakayama, MD Sent: 06/11/2021   5:27 PM EDT To: Laury Deep, RN  Give her cipro 500 mg Po BID, like 3 doses and we can finish as inpt  sch ----- Message ----- From: Laury Deep, RN Sent: 06/11/2021   1:15 PM EDT To: Melrose Nakayama, MD  Please review abnormal UA from pre-op and let me know if anything is needed.  Thanks, [employer]

## 2021-06-12 NOTE — Anesthesia Preprocedure Evaluation (Addendum)
Anesthesia Evaluation  Patient identified by MRN, date of birth, ID band Patient awake    Reviewed: Allergy & Precautions, NPO status , Patient's Chart, lab work & pertinent test results  Airway Mallampati: II  TM Distance: >3 FB Neck ROM: Full    Dental  (+) Teeth Intact, Dental Advisory Given   Pulmonary neg pulmonary ROS, former smoker,     + decreased breath sounds      Cardiovascular negative cardio ROS   Rhythm:Regular Rate:Normal     Neuro/Psych Seizures -,  PSYCHIATRIC DISORDERS Depression  Neuromuscular disease    GI/Hepatic Neg liver ROS, GERD  Controlled,  Endo/Other  negative endocrine ROS  Renal/GU negative Renal ROS     Musculoskeletal  (+) Arthritis ,   Abdominal Normal abdominal exam  (+)   Peds  Hematology negative hematology ROS (+)   Anesthesia Other Findings   Reproductive/Obstetrics                            Anesthesia Physical Anesthesia Plan  ASA: 3  Anesthesia Plan: General   Post-op Pain Management:    Induction: Intravenous  PONV Risk Score and Plan: 4 or greater and Ondansetron, Dexamethasone, Midazolam and Treatment may vary due to age or medical condition  Airway Management Planned: Double Lumen EBT  Additional Equipment: Arterial line, CVP and Ultrasound Guidance Line Placement  Intra-op Plan:   Post-operative Plan: Extubation in OR  Informed Consent: I have reviewed the patients History and Physical, chart, labs and discussed the procedure including the risks, benefits and alternatives for the proposed anesthesia with the patient or authorized representative who has indicated his/her understanding and acceptance.     Dental advisory given  Plan Discussed with: CRNA  Anesthesia Plan Comments: (See APP note by Durel Salts, FNP )       Anesthesia Quick Evaluation

## 2021-06-12 NOTE — Progress Notes (Addendum)
Anesthesia Chart Review:   Case: [id number] Date/Time: 06/13/21 0945   Procedure: XI ROBOTIC ASSISTED THORASCOPY-LEFT UPPER LOBECTOMY (Left: Chest)   Anesthesia type: General   Pre-op diagnosis: LUL LUNG CANCER   Location: [facility] OR ROOM 10 / [facility]   Surgeons: Melrose Nakayama, MD       DISCUSSION: Pt is 75 years old with hx hepatis steatosis, lung cancer with brain metastasis (has weakness and foot drop LLE, s/p craniotomy/resection of brain mass 04/30/21)  - Hospitalized 8/2-11/22 for seizure-like activity.  Found to have a 2x2cm brain mass with hemorrhagic posterior right frontal lobe mass and associated vasogenic edema.  Patient underwent bilateral parietal craniotomy with resection of the brain mass.  CT chest/abdomen/pelvis showed a spiculated left upper lobe lung mass, suspicious for primary malignancy. 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 Discharged to rehab for 3 weeks.     VS: BP 130/64   Pulse 85   Temp 37 C (Oral)   Resp 19   Ht 5\' 6"  (1.676 m)   Wt 93.9 kg   SpO2 97%   BMI 33.41 kg/m   PROVIDERS: - PCP is Leamon Arnt, MD - Oncologist is Benay Pike, MD - Radiation oncologist is Tyler Pita, MD   LABS: Labs reviewed: Acceptable for surgery. - Many bacteria on microscopic UA. Dr. Roxan Hockey has started pt on cipro.   (all labs ordered are listed, but only abnormal results are displayed)  Labs Reviewed  COMPREHENSIVE METABOLIC PANEL - Abnormal; Notable for the following components:      Result Value   Sodium 134 (*)    CO2 20 (*)    Glucose, Bld 107 (*)    Calcium 8.7 (*)    Total Protein 6.0 (*)    All other components within normal limits  BLOOD GAS, ARTERIAL - Abnormal; Notable for the following components:   pH, Arterial 7.464 (*)    pCO2 arterial 30.3 (*)    All other components within normal limits  URINALYSIS, ROUTINE W REFLEX MICROSCOPIC - Abnormal; Notable for the following  components:   Leukocytes,Ua SMALL (*)    All other components within normal limits  URINALYSIS, MICROSCOPIC (REFLEX) - Abnormal; Notable for the following components:   Bacteria, UA MANY (*)    All other components within normal limits  SURGICAL PCR SCREEN  SARS CORONAVIRUS 2 (TAT 6-24 HRS)  CBC  PROTIME-INR  APTT  TYPE AND SCREEN     IMAGES: CXR 06/11/21:  - Negative for acute cardiopulmonary disease. - Nodular density in the left suprahilar region corresponds to findings on recent PET-CT  CT chest, abd, pelvis 04/25/21:  1. Large spiculated left upper lobe suprahilar mass most consistent with primary bronchogenic malignancy. This may be amenable to endobronchial sampling given proximity to the left upper bronchus. 2. Indeterminate 8 mm right upper lobe pulmonary nodule. Metastatic disease cannot be excluded. Attention on follow-up recommended. 3. No acute intra-abdominal or intrapelvic process. No evidence of subdiaphragmatic metastases. 4. Sigmoid diverticulosis without diverticulitis. 5. Aortic Atherosclerosis (ICD10-I70.0). Coronary artery atherosclerosis.    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[place])    S/p total hip replacement left   Breast cancer ([place]) 2015   left    Cataract    Cholecystolithiasis    Chronic allergic rhinitis    Depression    Former smoker, stopped smoking in distant past 10/05/2019   Quit 2000; 34 y smoking history   GERD (gastroesophageal reflux disease)    Hepatic steatosis 06/08/2020   Intermittent elevated LFTs and ultrasound 2018    Hyperlipemia    IBS (irritable bowel syndrome) 10/08/2018   Nl colonoscopy and EGD 09/2018   IBS (irritable bowel syndrome)    Lichen plano-pilaris    Major depression, 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 Repeat 2020   Urinary tract infection    hx of frequent     Past Surgical History:  Procedure Laterality Date   APPENDECTOMY     APPLICATION OF CRANIAL NAVIGATION Right 04/30/2021   Procedure: APPLICATION OF CRANIAL NAVIGATION;  Surgeon: Earnie Larsson, MD;  Location: [facility];  Service: Neurosurgery;  Laterality: Right;   BREAST LUMPECTOMY Left 2015   CATARACT EXTRACTION Bilateral    cataracts     CHOLECYSTECTOMY N/A 10/29/2016   Procedure: LAPAROSCOPIC CHOLECYSTECTOMY;  Surgeon: Erroll Luna, MD;  Location: [facility];  Service: General;  Laterality: N/A;   COLONOSCOPY     CRANIOTOMY Right 04/30/2021   Procedure: Right Parietal Craniotomy for tumor with Brain Lab;  Surgeon: Earnie Larsson, MD;  Location: [facility];  Service: Neurosurgery;  Laterality: Right;   EYE SURGERY     LIPOSUCTION     TOTAL HIP ARTHROPLASTY     bilateral hip arthoplasty   TOTAL HIP ARTHROPLASTY Right 12/18/2015   Procedure: RIGHT TOTAL HIP ARTHROPLASTY ANTERIOR APPROACH;  Surgeon: Gaynelle Arabian, MD;  Location: [facility];  Service: Orthopedics;  Laterality: Right;   TOTAL KNEE ARTHROPLASTY     TOTAL KNEE ARTHROPLASTY Left 11/25/2016   Procedure: LEFT TOTAL KNEE ARTHROPLASTY;  Surgeon: Gaynelle Arabian, MD;  Location: [facility];  Service: Orthopedics;  Laterality: Left;  with abductor block   TUBAL LIGATION     UPPER GASTROINTESTINAL ENDOSCOPY      MEDICATIONS:  busPIRone (BUSPAR) 7.5 MG tablet   clobetasol (TEMOVATE) 0.05 % external solution    dexamethasone (DECADRON) 2 MG tablet   fluticasone (FLONASE) 50 MCG/ACT nasal spray   gabapentin (NEURONTIN) 300 MG capsule   rOPINIRole (REQUIP) 4 MG tablet   No current facility-administered medications for this encounter.    If no changes, I anticipate pt can proceed with surgery as scheduled.   Willeen Cass, PhD, FNP-BC [facility]/Anesthesiology Phone: [phone] 06/12/2021 10:37 AM

## 2021-06-13 ENCOUNTER — Inpatient Hospital Stay (HOSPITAL_COMMUNITY): Payer: Medicare Other | Admitting: Anesthesiology

## 2021-06-13 ENCOUNTER — Encounter (HOSPITAL_COMMUNITY)
Admission: RE | Disposition: A | Discharge status: Rehabilitation Facility | Payer: Self-pay | Source: Home / Self Care | Attending: Thoracic Surgery (Cardiothoracic Vascular Surgery)

## 2021-06-13 ENCOUNTER — Inpatient Hospital Stay (HOSPITAL_COMMUNITY): Payer: Medicare Other | Admitting: Emergency Medicine

## 2021-06-13 ENCOUNTER — Other Ambulatory Visit: Payer: Self-pay

## 2021-06-13 ENCOUNTER — Encounter (HOSPITAL_COMMUNITY): Payer: Self-pay | Admitting: Thoracic Surgery (Cardiothoracic Vascular Surgery)

## 2021-06-13 ENCOUNTER — Inpatient Hospital Stay (HOSPITAL_COMMUNITY): Payer: Medicare Other

## 2021-06-13 ENCOUNTER — Inpatient Hospital Stay (HOSPITAL_COMMUNITY)
Admission: RE | Admit: 2021-06-13 | Discharge: 2021-06-20 | DRG: 164 | Disposition: A | Discharge status: Rehabilitation Facility | Payer: Medicare Other | Attending: Thoracic Surgery (Cardiothoracic Vascular Surgery) | Admitting: Thoracic Surgery (Cardiothoracic Vascular Surgery)

## 2021-06-13 DIAGNOSIS — D259 Leiomyoma of uterus, unspecified: Secondary | ICD-10-CM | POA: Diagnosis present

## 2021-06-13 DIAGNOSIS — Z6833 Body mass index (BMI) 33.0-33.9, adult: Secondary | ICD-10-CM | POA: Diagnosis not present

## 2021-06-13 DIAGNOSIS — G629 Polyneuropathy, unspecified: Secondary | ICD-10-CM | POA: Diagnosis present

## 2021-06-13 DIAGNOSIS — Z91041 Radiographic dye allergy status: Secondary | ICD-10-CM | POA: Diagnosis not present

## 2021-06-13 DIAGNOSIS — G2581 Restless legs syndrome: Secondary | ICD-10-CM | POA: Diagnosis not present

## 2021-06-13 DIAGNOSIS — R531 Weakness: Secondary | ICD-10-CM | POA: Diagnosis not present

## 2021-06-13 DIAGNOSIS — Z09 Encounter for follow-up examination after completed treatment for conditions other than malignant neoplasm: Secondary | ICD-10-CM

## 2021-06-13 DIAGNOSIS — J309 Allergic rhinitis, unspecified: Secondary | ICD-10-CM | POA: Diagnosis present

## 2021-06-13 DIAGNOSIS — Z882 Allergy status to sulfonamides status: Secondary | ICD-10-CM

## 2021-06-13 DIAGNOSIS — Z853 Personal history of malignant neoplasm of breast: Secondary | ICD-10-CM

## 2021-06-13 DIAGNOSIS — D62 Acute posthemorrhagic anemia: Secondary | ICD-10-CM | POA: Diagnosis not present

## 2021-06-13 DIAGNOSIS — E669 Obesity, unspecified: Secondary | ICD-10-CM | POA: Diagnosis not present

## 2021-06-13 DIAGNOSIS — C3432 Malignant neoplasm of lower lobe, left bronchus or lung: Secondary | ICD-10-CM | POA: Diagnosis not present

## 2021-06-13 DIAGNOSIS — Z885 Allergy status to narcotic agent status: Secondary | ICD-10-CM

## 2021-06-13 DIAGNOSIS — Z902 Acquired absence of lung [part of]: Secondary | ICD-10-CM | POA: Diagnosis not present

## 2021-06-13 DIAGNOSIS — Z87891 Personal history of nicotine dependence: Secondary | ICD-10-CM

## 2021-06-13 DIAGNOSIS — F329 Major depressive disorder, single episode, unspecified: Secondary | ICD-10-CM | POA: Diagnosis not present

## 2021-06-13 DIAGNOSIS — K589 Irritable bowel syndrome without diarrhea: Secondary | ICD-10-CM | POA: Diagnosis present

## 2021-06-13 DIAGNOSIS — I517 Cardiomegaly: Secondary | ICD-10-CM | POA: Diagnosis not present

## 2021-06-13 DIAGNOSIS — I2510 Atherosclerotic heart disease of native coronary artery without angina pectoris: Secondary | ICD-10-CM | POA: Diagnosis present

## 2021-06-13 DIAGNOSIS — Z419 Encounter for procedure for purposes other than remedying health state, unspecified: Secondary | ICD-10-CM

## 2021-06-13 DIAGNOSIS — I69354 Hemiplegia and hemiparesis following cerebral infarction affecting left non-dominant side: Secondary | ICD-10-CM

## 2021-06-13 DIAGNOSIS — Z9049 Acquired absence of other specified parts of digestive tract: Secondary | ICD-10-CM

## 2021-06-13 DIAGNOSIS — Z96643 Presence of artificial hip joint, bilateral: Secondary | ICD-10-CM | POA: Diagnosis present

## 2021-06-13 DIAGNOSIS — M21372 Foot drop, left foot: Secondary | ICD-10-CM | POA: Diagnosis present

## 2021-06-13 DIAGNOSIS — Z91048 Other nonmedicinal substance allergy status: Secondary | ICD-10-CM

## 2021-06-13 DIAGNOSIS — Z923 Personal history of irradiation: Secondary | ICD-10-CM

## 2021-06-13 DIAGNOSIS — G479 Sleep disorder, unspecified: Secondary | ICD-10-CM | POA: Diagnosis not present

## 2021-06-13 DIAGNOSIS — G4700 Insomnia, unspecified: Secondary | ICD-10-CM | POA: Diagnosis present

## 2021-06-13 DIAGNOSIS — J9811 Atelectasis: Secondary | ICD-10-CM | POA: Diagnosis not present

## 2021-06-13 DIAGNOSIS — R2689 Other abnormalities of gait and mobility: Secondary | ICD-10-CM | POA: Diagnosis not present

## 2021-06-13 DIAGNOSIS — J439 Emphysema, unspecified: Secondary | ICD-10-CM | POA: Diagnosis not present

## 2021-06-13 DIAGNOSIS — Z96652 Presence of left artificial knee joint: Secondary | ICD-10-CM | POA: Diagnosis present

## 2021-06-13 DIAGNOSIS — Z4682 Encounter for fitting and adjustment of non-vascular catheter: Secondary | ICD-10-CM

## 2021-06-13 DIAGNOSIS — G8918 Other acute postprocedural pain: Secondary | ICD-10-CM | POA: Diagnosis not present

## 2021-06-13 DIAGNOSIS — Z20822 Contact with and (suspected) exposure to covid-19: Secondary | ICD-10-CM | POA: Diagnosis not present

## 2021-06-13 DIAGNOSIS — Z833 Family history of diabetes mellitus: Secondary | ICD-10-CM | POA: Diagnosis not present

## 2021-06-13 DIAGNOSIS — R569 Unspecified convulsions: Secondary | ICD-10-CM | POA: Diagnosis not present

## 2021-06-13 DIAGNOSIS — Z9889 Other specified postprocedural states: Secondary | ICD-10-CM | POA: Diagnosis not present

## 2021-06-13 DIAGNOSIS — Z8261 Family history of arthritis: Secondary | ICD-10-CM | POA: Diagnosis not present

## 2021-06-13 DIAGNOSIS — J939 Pneumothorax, unspecified: Secondary | ICD-10-CM | POA: Diagnosis present

## 2021-06-13 DIAGNOSIS — Z801 Family history of malignant neoplasm of trachea, bronchus and lung: Secondary | ICD-10-CM | POA: Diagnosis not present

## 2021-06-13 DIAGNOSIS — G609 Hereditary and idiopathic neuropathy, unspecified: Secondary | ICD-10-CM | POA: Diagnosis present

## 2021-06-13 DIAGNOSIS — Z79899 Other long term (current) drug therapy: Secondary | ICD-10-CM

## 2021-06-13 DIAGNOSIS — C3412 Malignant neoplasm of upper lobe, left bronchus or lung: Secondary | ICD-10-CM | POA: Diagnosis not present

## 2021-06-13 DIAGNOSIS — E7801 Familial hypercholesterolemia: Secondary | ICD-10-CM | POA: Diagnosis not present

## 2021-06-13 DIAGNOSIS — K219 Gastro-esophageal reflux disease without esophagitis: Secondary | ICD-10-CM | POA: Diagnosis present

## 2021-06-13 DIAGNOSIS — Z800 Family history of malignant neoplasm of digestive organs: Secondary | ICD-10-CM | POA: Diagnosis not present

## 2021-06-13 DIAGNOSIS — Z888 Allergy status to other drugs, medicaments and biological substances status: Secondary | ICD-10-CM | POA: Diagnosis not present

## 2021-06-13 DIAGNOSIS — Z9851 Tubal ligation status: Secondary | ICD-10-CM

## 2021-06-13 DIAGNOSIS — Z7952 Long term (current) use of systemic steroids: Secondary | ICD-10-CM | POA: Diagnosis not present

## 2021-06-13 DIAGNOSIS — K5900 Constipation, unspecified: Secondary | ICD-10-CM | POA: Diagnosis not present

## 2021-06-13 DIAGNOSIS — C7931 Secondary malignant neoplasm of brain: Secondary | ICD-10-CM | POA: Diagnosis not present

## 2021-06-13 DIAGNOSIS — I10 Essential (primary) hypertension: Secondary | ICD-10-CM | POA: Diagnosis not present

## 2021-06-13 DIAGNOSIS — R079 Chest pain, unspecified: Secondary | ICD-10-CM | POA: Diagnosis not present

## 2021-06-13 DIAGNOSIS — Z803 Family history of malignant neoplasm of breast: Secondary | ICD-10-CM | POA: Diagnosis not present

## 2021-06-13 HISTORY — PX: LYMPH NODE DISSECTION: SHX5087

## 2021-06-13 HISTORY — PX: INTERCOSTAL NERVE BLOCK: SHX5021

## 2021-06-13 SURGERY — LOBECTOMY, LUNG, ROBOT-ASSISTED, USING VATS
Anesthesia: General | Site: Chest | Laterality: Left

## 2021-06-13 MED ORDER — ONDANSETRON HCL 4 MG/2ML IJ SOLN
INTRAMUSCULAR | Status: AC
  Filled 2021-06-13: qty 2

## 2021-06-13 MED ORDER — PROPOFOL 10 MG/ML IV BOLUS
INTRAVENOUS | Status: AC
  Filled 2021-06-13: qty 20

## 2021-06-13 MED ORDER — FENTANYL CITRATE (PF) 100 MCG/2ML IJ SOLN: 25.0000 ug | Freq: Once | INTRAMUSCULAR | Status: AC

## 2021-06-13 MED ORDER — KETOROLAC TROMETHAMINE 15 MG/ML IJ SOLN
15.0000 mg | Freq: Four times a day (QID) | INTRAMUSCULAR | Status: AC | PRN
  Administered 2021-06-13 – 2021-06-14 (×4): 15 mg via INTRAVENOUS
  Filled 2021-06-13 (×4): qty 1

## 2021-06-13 MED ORDER — PHENYLEPHRINE HCL (PRESSORS) 10 MG/ML IV SOLN
INTRAVENOUS | Status: DC | PRN
  Administered 2021-06-13: 100 ug via INTRAVENOUS

## 2021-06-13 MED ORDER — DEXTROSE-NACL 5-0.9 % IV SOLN: INTRAVENOUS | Status: DC

## 2021-06-13 MED ORDER — GABAPENTIN 300 MG PO CAPS
600.0000 mg | ORAL_CAPSULE | Freq: Every day | ORAL | Status: DC
  Administered 2021-06-14 – 2021-06-19 (×6): 600 mg via ORAL
  Filled 2021-06-13 (×6): qty 2

## 2021-06-13 MED ORDER — TRAMADOL HCL 50 MG PO TABS
50.0000 mg | ORAL_TABLET | Freq: Four times a day (QID) | ORAL | Status: DC | PRN
  Administered 2021-06-13: 50 mg via ORAL
  Administered 2021-06-14 – 2021-06-16 (×2): 100 mg via ORAL
  Filled 2021-06-13: qty 1
  Filled 2021-06-13 (×2): qty 2

## 2021-06-13 MED ORDER — ACETAMINOPHEN 10 MG/ML IV SOLN: 1000.0000 mg | Freq: Once | INTRAVENOUS | Status: DC | PRN

## 2021-06-13 MED ORDER — CLOBETASOL PROPIONATE 0.05 % EX SOLN: 1.0000 "application " | Freq: Every day | CUTANEOUS | Status: DC | PRN

## 2021-06-13 MED ORDER — BISACODYL 5 MG PO TBEC
10.0000 mg | DELAYED_RELEASE_TABLET | Freq: Every day | ORAL | Status: DC
  Administered 2021-06-14 – 2021-06-20 (×6): 10 mg via ORAL
  Filled 2021-06-13 (×8): qty 2

## 2021-06-13 MED ORDER — CEFAZOLIN SODIUM-DEXTROSE 2-4 GM/100ML-% IV SOLN
2.0000 g | Freq: Three times a day (TID) | INTRAVENOUS | Status: AC
  Administered 2021-06-13 – 2021-06-14 (×2): 2 g via INTRAVENOUS
  Filled 2021-06-13 (×2): qty 100

## 2021-06-13 MED ORDER — ACETAMINOPHEN 500 MG PO TABS
1000.0000 mg | ORAL_TABLET | Freq: Four times a day (QID) | ORAL | Status: AC
  Administered 2021-06-13 – 2021-06-18 (×18): 1000 mg via ORAL
  Filled 2021-06-13 (×18): qty 2

## 2021-06-13 MED ORDER — ORAL CARE MOUTH RINSE: 15.0000 mL | Freq: Once | OROMUCOSAL | Status: AC

## 2021-06-13 MED ORDER — MIDAZOLAM HCL 2 MG/2ML IJ SOLN
INTRAMUSCULAR | Status: AC
  Filled 2021-06-13: qty 2

## 2021-06-13 MED ORDER — ROPINIROLE HCL 1 MG PO TABS
4.0000 mg | ORAL_TABLET | Freq: Every day | ORAL | Status: DC
  Administered 2021-06-14 – 2021-06-19 (×6): 4 mg via ORAL
  Filled 2021-06-13 (×8): qty 4

## 2021-06-13 MED ORDER — FENTANYL CITRATE (PF) 100 MCG/2ML IJ SOLN
INTRAMUSCULAR | Status: AC
  Administered 2021-06-13: 25 ug via INTRAVENOUS
  Filled 2021-06-13: qty 2

## 2021-06-13 MED ORDER — CIPROFLOXACIN HCL 500 MG PO TABS
500.0000 mg | ORAL_TABLET | Freq: Two times a day (BID) | ORAL | Status: DC
  Administered 2021-06-13 – 2021-06-18 (×11): 500 mg via ORAL
  Filled 2021-06-13 (×11): qty 1

## 2021-06-13 MED ORDER — FENTANYL CITRATE (PF) 100 MCG/2ML IJ SOLN: 25.0000 ug | INTRAMUSCULAR | Status: DC | PRN

## 2021-06-13 MED ORDER — ACETAMINOPHEN 325 MG PO TABS: 325.0000 mg | ORAL_TABLET | ORAL | Status: DC | PRN

## 2021-06-13 MED ORDER — BUPIVACAINE HCL (PF) 0.5 % IJ SOLN
INTRAMUSCULAR | Status: AC
  Filled 2021-06-13: qty 30

## 2021-06-13 MED ORDER — ENOXAPARIN SODIUM 40 MG/0.4ML IJ SOSY
40.0000 mg | PREFILLED_SYRINGE | Freq: Every day | INTRAMUSCULAR | Status: DC
  Administered 2021-06-14 – 2021-06-20 (×7): 40 mg via SUBCUTANEOUS
  Filled 2021-06-13 (×8): qty 0.4

## 2021-06-13 MED ORDER — PROMETHAZINE HCL 25 MG/ML IJ SOLN: 6.2500 mg | INTRAMUSCULAR | Status: DC | PRN

## 2021-06-13 MED ORDER — FLUTICASONE PROPIONATE 50 MCG/ACT NA SUSP: 1.0000 | Freq: Every day | NASAL | Status: DC | PRN

## 2021-06-13 MED ORDER — FENTANYL CITRATE (PF) 250 MCG/5ML IJ SOLN
INTRAMUSCULAR | Status: AC
  Filled 2021-06-13: qty 5

## 2021-06-13 MED ORDER — PHENYLEPHRINE HCL-NACL 20-0.9 MG/250ML-% IV SOLN
INTRAVENOUS | Status: DC | PRN
  Administered 2021-06-13: 50 ug/min via INTRAVENOUS

## 2021-06-13 MED ORDER — BUPIVACAINE LIPOSOME 1.3 % IJ SUSP
INTRAMUSCULAR | Status: AC
  Filled 2021-06-13: qty 20

## 2021-06-13 MED ORDER — CHLORHEXIDINE GLUCONATE 0.12 % MT SOLN
15.0000 mL | Freq: Once | OROMUCOSAL | Status: AC
  Administered 2021-06-13: 15 mL via OROMUCOSAL
  Filled 2021-06-13: qty 15

## 2021-06-13 MED ORDER — OXYCODONE HCL 5 MG PO TABS
5.0000 mg | ORAL_TABLET | ORAL | Status: DC | PRN
  Administered 2021-06-13: 5 mg via ORAL
  Administered 2021-06-15: 10 mg via ORAL
  Filled 2021-06-13: qty 1
  Filled 2021-06-13: qty 2

## 2021-06-13 MED ORDER — SODIUM CHLORIDE FLUSH 0.9 % IV SOLN
INTRAVENOUS | Status: DC | PRN
  Administered 2021-06-13: 90 mL

## 2021-06-13 MED ORDER — BUSPIRONE HCL 15 MG PO TABS
7.5000 mg | ORAL_TABLET | Freq: Two times a day (BID) | ORAL | Status: DC | PRN
  Administered 2021-06-19: 7.5 mg via ORAL
  Filled 2021-06-13: qty 1

## 2021-06-13 MED ORDER — FENTANYL CITRATE (PF) 100 MCG/2ML IJ SOLN
INTRAMUSCULAR | Status: AC
  Administered 2021-06-13: 50 ug via INTRAVENOUS
  Filled 2021-06-13: qty 2

## 2021-06-13 MED ORDER — SENNOSIDES-DOCUSATE SODIUM 8.6-50 MG PO TABS
1.0000 | ORAL_TABLET | Freq: Every day | ORAL | Status: DC
  Administered 2021-06-14 – 2021-06-19 (×6): 1 via ORAL
  Filled 2021-06-13 (×7): qty 1

## 2021-06-13 MED ORDER — ONDANSETRON HCL 4 MG/2ML IJ SOLN
4.0000 mg | Freq: Four times a day (QID) | INTRAMUSCULAR | Status: DC | PRN
  Administered 2021-06-13: 4 mg via INTRAVENOUS
  Filled 2021-06-13: qty 2

## 2021-06-13 MED ORDER — ROCURONIUM BROMIDE 10 MG/ML (PF) SYRINGE
PREFILLED_SYRINGE | INTRAVENOUS | Status: DC | PRN
  Administered 2021-06-13: 70 mg via INTRAVENOUS
  Administered 2021-06-13 (×2): 30 mg via INTRAVENOUS
  Administered 2021-06-13: 20 mg via INTRAVENOUS

## 2021-06-13 MED ORDER — CEFAZOLIN SODIUM-DEXTROSE 2-4 GM/100ML-% IV SOLN
INTRAVENOUS | Status: AC
  Filled 2021-06-13: qty 100

## 2021-06-13 MED ORDER — OXYCODONE HCL 5 MG/5ML PO SOLN: 5.0000 mg | Freq: Once | ORAL | Status: DC | PRN

## 2021-06-13 MED ORDER — MIDAZOLAM HCL 2 MG/2ML IJ SOLN: 1.0000 mg | Freq: Once | INTRAMUSCULAR | Status: AC

## 2021-06-13 MED ORDER — ONDANSETRON HCL 4 MG/2ML IJ SOLN
INTRAMUSCULAR | Status: DC | PRN
  Administered 2021-06-13: 4 mg via INTRAVENOUS

## 2021-06-13 MED ORDER — PROPOFOL 10 MG/ML IV BOLUS
INTRAVENOUS | Status: DC | PRN
  Administered 2021-06-13: 30 mg via INTRAVENOUS
  Administered 2021-06-13: 150 mg via INTRAVENOUS

## 2021-06-13 MED ORDER — ALBUTEROL SULFATE (2.5 MG/3ML) 0.083% IN NEBU
2.5000 mg | INHALATION_SOLUTION | RESPIRATORY_TRACT | Status: DC
  Administered 2021-06-13 – 2021-06-14 (×2): 2.5 mg via RESPIRATORY_TRACT
  Filled 2021-06-13 (×2): qty 3

## 2021-06-13 MED ORDER — 0.9 % SODIUM CHLORIDE (POUR BTL) OPTIME
TOPICAL | Status: DC | PRN
  Administered 2021-06-13: 1000 mL

## 2021-06-13 MED ORDER — LACTATED RINGERS IV SOLN: INTRAVENOUS | Status: DC

## 2021-06-13 MED ORDER — MIDAZOLAM HCL 2 MG/2ML IJ SOLN
INTRAMUSCULAR | Status: AC
  Administered 2021-06-13: 1 mg via INTRAVENOUS
  Filled 2021-06-13: qty 2

## 2021-06-13 MED ORDER — DEXAMETHASONE SODIUM PHOSPHATE 10 MG/ML IJ SOLN
INTRAMUSCULAR | Status: DC | PRN
  Administered 2021-06-13: 10 mg via INTRAVENOUS

## 2021-06-13 MED ORDER — CEFAZOLIN SODIUM-DEXTROSE 2-4 GM/100ML-% IV SOLN
2.0000 g | INTRAVENOUS | Status: AC
  Administered 2021-06-13 (×2): 2 g via INTRAVENOUS
  Filled 2021-06-13: qty 100

## 2021-06-13 MED ORDER — MIDAZOLAM HCL 5 MG/5ML IJ SOLN
INTRAMUSCULAR | Status: DC | PRN
  Administered 2021-06-13: 2 mg via INTRAVENOUS

## 2021-06-13 MED ORDER — LUNG SURGERY BOOK
Freq: Once | Status: AC
  Filled 2021-06-13: qty 1

## 2021-06-13 MED ORDER — PHENYLEPHRINE 40 MCG/ML (10ML) SYRINGE FOR IV PUSH (FOR BLOOD PRESSURE SUPPORT)
PREFILLED_SYRINGE | INTRAVENOUS | Status: AC
  Filled 2021-06-13: qty 10

## 2021-06-13 MED ORDER — AMISULPRIDE (ANTIEMETIC) 5 MG/2ML IV SOLN: 10.0000 mg | Freq: Once | INTRAVENOUS | Status: DC | PRN

## 2021-06-13 MED ORDER — DEXAMETHASONE 4 MG PO TABS
2.0000 mg | ORAL_TABLET | Freq: Two times a day (BID) | ORAL | Status: DC
  Administered 2021-06-13 – 2021-06-20 (×14): 2 mg via ORAL
  Filled 2021-06-13 (×14): qty 1

## 2021-06-13 MED ORDER — ALBUTEROL SULFATE (2.5 MG/3ML) 0.083% IN NEBU: 2.5000 mg | INHALATION_SOLUTION | RESPIRATORY_TRACT | Status: DC

## 2021-06-13 MED ORDER — FENTANYL CITRATE (PF) 100 MCG/2ML IJ SOLN
INTRAMUSCULAR | Status: DC | PRN
  Administered 2021-06-13: 50 ug via INTRAVENOUS
  Administered 2021-06-13: 150 ug via INTRAVENOUS
  Administered 2021-06-13 (×3): 100 ug via INTRAVENOUS

## 2021-06-13 MED ORDER — ACETAMINOPHEN 160 MG/5ML PO SOLN: 325.0000 mg | ORAL | Status: DC | PRN

## 2021-06-13 MED ORDER — ACETAMINOPHEN 160 MG/5ML PO SOLN: 1000.0000 mg | Freq: Four times a day (QID) | ORAL | Status: AC

## 2021-06-13 MED ORDER — OXYCODONE HCL 5 MG PO TABS: 5.0000 mg | ORAL_TABLET | Freq: Once | ORAL | Status: DC | PRN

## 2021-06-13 MED ORDER — LACTATED RINGERS IV SOLN: INTRAVENOUS | Status: DC | PRN

## 2021-06-13 MED ORDER — SUGAMMADEX SODIUM 200 MG/2ML IV SOLN
INTRAVENOUS | Status: DC | PRN
  Administered 2021-06-13: 300 mg via INTRAVENOUS

## 2021-06-13 SURGICAL SUPPLY — 83 items
ADH SKN CLS APL DERMABOND .7 (GAUZE/BANDAGES/DRESSINGS) ×1
BAG TISS RTRVL C300 12X14 (MISCELLANEOUS) ×1
CANISTER SUCT 3000ML PPV (MISCELLANEOUS) ×4 IMPLANT
CANNULA REDUC XI 12-8 STAPL (CANNULA) ×2
CANNULA REDUCER 12-8 DVNC XI (CANNULA) ×2 IMPLANT
CATH THORACIC 28FR RT ANG (CATHETERS) IMPLANT
CLIP TI MEDIUM 24 (CLIP) ×2 IMPLANT
CNTNR URN SCR LID CUP LEK RST (MISCELLANEOUS) ×13 IMPLANT
CONT SPEC 4OZ STRL OR WHT (MISCELLANEOUS) ×26
DEFOGGER SCOPE WARMER CLEARIFY (MISCELLANEOUS) ×4 IMPLANT
DERMABOND ADVANCED (GAUZE/BANDAGES/DRESSINGS) ×1
DERMABOND ADVANCED .7 DNX12 (GAUZE/BANDAGES/DRESSINGS) ×1 IMPLANT
DRAIN CHANNEL 28F RND 3/8 FF (WOUND CARE) ×2 IMPLANT
DRAPE ARM DVNC X/XI (DISPOSABLE) ×4 IMPLANT
DRAPE COLUMN DVNC XI (DISPOSABLE) ×1 IMPLANT
DRAPE CV SPLIT W-CLR ANES SCRN (DRAPES) ×2 IMPLANT
DRAPE DA VINCI XI ARM (DISPOSABLE) ×4
DRAPE DA VINCI XI COLUMN (DISPOSABLE) ×1
DRAPE HALF SHEET 40X57 (DRAPES) ×2 IMPLANT
DRAPE INCISE IOBAN 66X45 STRL (DRAPES) ×2 IMPLANT
DRAPE ORTHO SPLIT 77X108 STRL (DRAPES) ×2
DRAPE SURG ORHT 6 SPLT 77X108 (DRAPES) ×1 IMPLANT
ELECT BLADE 6.5 EXT (BLADE) ×2 IMPLANT
ELECT REM PT RETURN 9FT ADLT (ELECTROSURGICAL) ×2
ELECTRODE REM PT RTRN 9FT ADLT (ELECTROSURGICAL) ×1 IMPLANT
GAUZE KITTNER 4X5 RF (MISCELLANEOUS) ×6 IMPLANT
GAUZE SPONGE 4X4 12PLY STRL (GAUZE/BANDAGES/DRESSINGS) ×2 IMPLANT
GLOVE SURG POLYISO LF SZ8 (GLOVE) ×4 IMPLANT
GLOVE SURG SYN 7.5  E (GLOVE) ×1
GLOVE SURG SYN 7.5 E (GLOVE) ×1 IMPLANT
GLOVE TRIUMPH SURG SIZE 7.5 (KITS) IMPLANT
GOWN STRL REUS W/ TWL LRG LVL3 (GOWN DISPOSABLE) ×2 IMPLANT
GOWN STRL REUS W/ TWL XL LVL3 (GOWN DISPOSABLE) ×2 IMPLANT
GOWN STRL REUS W/TWL 2XL LVL3 (GOWN DISPOSABLE) ×2 IMPLANT
GOWN STRL REUS W/TWL LRG LVL3 (GOWN DISPOSABLE) ×4
GOWN STRL REUS W/TWL XL LVL3 (GOWN DISPOSABLE) ×4
HEMOSTAT SURGICEL 2X14 (HEMOSTASIS) ×4 IMPLANT
IRRIGATION STRYKERFLOW (MISCELLANEOUS) ×1 IMPLANT
IRRIGATOR STRYKERFLOW (MISCELLANEOUS) ×2
KIT BASIN OR (CUSTOM PROCEDURE TRAY) ×2 IMPLANT
KIT TURNOVER KIT B (KITS) ×2 IMPLANT
NEEDLE HYPO 25GX1X1/2 BEV (NEEDLE) ×2 IMPLANT
NEEDLE SPNL 22GX3.5 QUINCKE BK (NEEDLE) ×2 IMPLANT
NS IRRIG 1000ML POUR BTL (IV SOLUTION) ×4 IMPLANT
PACK CHEST (CUSTOM PROCEDURE TRAY) ×2 IMPLANT
PAD ARMBOARD 7.5X6 YLW CONV (MISCELLANEOUS) ×4 IMPLANT
PORT ACCESS TROCAR AIRSEAL 12 (TROCAR) ×1 IMPLANT
PORT ACCESS TROCAR AIRSEAL 5M (TROCAR) ×1
RELOAD STAPLER 2.5X45 WHT DVNC (STAPLE) ×6 IMPLANT
RELOAD STAPLER 3.5X45 BLU DVNC (STAPLE) ×4 IMPLANT
RELOAD STAPLER 4.3X45 GRN DVNC (STAPLE) ×1 IMPLANT
SEAL CANN UNIV 5-8 DVNC XI (MISCELLANEOUS) ×2 IMPLANT
SEAL XI 5MM-8MM UNIVERSAL (MISCELLANEOUS) ×2
SET TRI-LUMEN FLTR TB AIRSEAL (TUBING) ×2 IMPLANT
SOLUTION ELECTROLUBE (MISCELLANEOUS) ×2 IMPLANT
SPONGE INTESTINAL PEANUT (DISPOSABLE) ×2 IMPLANT
SPONGE TONSIL TAPE 1 RFD (DISPOSABLE) IMPLANT
STAPLER 45 SUREFORM CVD (STAPLE) ×1
STAPLER 45 SUREFORM CVD DVNC (STAPLE) ×1 IMPLANT
STAPLER CANNULA SEAL DVNC XI (STAPLE) ×2 IMPLANT
STAPLER CANNULA SEAL XI (STAPLE) ×2
STAPLER RELOAD 2.5X45 WHITE (STAPLE) ×6
STAPLER RELOAD 2.5X45 WHT DVNC (STAPLE) ×6
STAPLER RELOAD 3.5X45 BLU DVNC (STAPLE) ×4
STAPLER RELOAD 3.5X45 BLUE (STAPLE) ×4
STAPLER RELOAD 4.3X45 GREEN (STAPLE) ×1
STAPLER RELOAD 4.3X45 GRN DVNC (STAPLE) ×1
SUT SILK  1 MH (SUTURE) ×3
SUT SILK 1 MH (SUTURE) ×3 IMPLANT
SUT SILK 1 TIES 10X30 (SUTURE) ×2 IMPLANT
SUT SILK 2 0 SH (SUTURE) ×2 IMPLANT
SUT VIC AB 1 CTX 36 (SUTURE) ×2
SUT VIC AB 1 CTX36XBRD ANBCTR (SUTURE) ×1 IMPLANT
SUT VIC AB 2-0 CTX 36 (SUTURE) ×2 IMPLANT
SUT VIC AB 3-0 X1 27 (SUTURE) ×4 IMPLANT
SUT VICRYL 0 TIES 12 18 (SUTURE) ×2 IMPLANT
SUT VICRYL 0 UR6 27IN ABS (SUTURE) ×4 IMPLANT
SYR 20CC LL (SYRINGE) ×4 IMPLANT
SYSTEM RETRIEVAL ANCHOR 12 (MISCELLANEOUS) ×2 IMPLANT
SYSTEM SAHARA CHEST DRAIN ATS (WOUND CARE) ×2 IMPLANT
TAPE CLOTH 4X10 WHT NS (GAUZE/BANDAGES/DRESSINGS) ×2 IMPLANT
TOWEL GREEN STERILE (TOWEL DISPOSABLE) ×2 IMPLANT
WATER STERILE IRR 1000ML POUR (IV SOLUTION) ×4 IMPLANT

## 2021-06-13 NOTE — Interval H&P Note (Signed)
History and Physical Interval Note: FEV1 1.8 L DLCO 75% of predicted  06/13/2021 8:28 AM  Tara Murphy  has presented today for surgery, with the diagnosis of LUL LUNG CANCER.  The various methods of treatment have been discussed with the patient and family. After consideration of risks, benefits and other options for treatment, the patient has consented to  Procedure(s): XI ROBOTIC ASSISTED THORASCOPY-LEFT UPPER LOBECTOMY (Left) as a surgical intervention.  The patient's history has been reviewed, patient examined, no change in status, stable for surgery.  I have reviewed the patient's chart and labs.  Questions were answered to the patient's satisfaction.     Melrose Nakayama

## 2021-06-13 NOTE — Anesthesia Procedure Notes (Signed)
Central Venous Catheter Insertion Performed by: Effie Berkshire, MD, anesthesiologist Start/End9/21/2022 8:40 AM, 06/13/2021 8:55 AM Patient location: Pre-op. Preanesthetic checklist: patient identified, IV checked, site marked, risks and benefits discussed, surgical consent, monitors and equipment checked, pre-op evaluation, timeout performed and anesthesia consent Position: Trendelenburg Lidocaine 1% used for infiltration and patient sedated Hand hygiene performed , maximum sterile barriers used  and Seldinger technique used Catheter size: 8 Fr Total catheter length 16. Central line was placed.Double lumen Procedure performed using ultrasound guided technique. Ultrasound Notes:anatomy identified, needle tip was noted to be adjacent to the nerve/plexus identified, no ultrasound evidence of intravascular and/or intraneural injection and image(s) printed for medical record Attempts: 1 Following insertion, dressing applied, line sutured and Biopatch. Post procedure assessment: blood return through all ports  Patient tolerated the procedure well with no immediate complications.

## 2021-06-13 NOTE — Anesthesia Procedure Notes (Signed)
Arterial Line Insertion Performed by: CRNA  Patient location: Pre-op. Preanesthetic checklist: patient identified, IV checked, site marked, risks and benefits discussed, surgical consent, monitors and equipment checked, pre-op evaluation, timeout performed and anesthesia consent Lidocaine 1% used for infiltration and patient sedated Right, radial was placed Catheter size: 20 G Hand hygiene performed , maximum sterile barriers used  and Seldinger technique used Allen's test indicative of satisfactory collateral circulation Attempts: 1 Procedure performed without using ultrasound guided technique. Following insertion, dressing applied and Biopatch. Post procedure assessment: normal  Patient tolerated the procedure well with no immediate complications.

## 2021-06-13 NOTE — Op Note (Signed)
Tara Murphy, Tara Murphy MEDICAL RECORD NO: [id number] ACCOUNT NO: [id number] DATE OF BIRTH: 07-02-1946 FACILITY: [facility] LOCATION: [facility] PHYSICIAN: Revonda Standard. Roxan Hockey, MD  Operative Report   DATE OF PROCEDURE: 06/13/2021  PREOPERATIVE DIAGNOSIS:  Stage IV adenocarcinoma left upper lobe.  POSTOPERATIVE DIAGNOSIS:  Stage IV adenocarcinoma left upper lobe.  PROCEDURES:   Xi robotic-assisted left upper lobectomy,  Lymph node dissection, and  Intercostal nerve blocks levels 3 through 10.  SURGEON:  Revonda Standard. Roxan Hockey, MD  ASSISTANT:  Jadene Pierini, PA-C.  ANESTHESIA:  General.  FINDINGS:  Multiple enlarged nodes.  Bronchial margin negative for tumor.  CLINICAL NOTE:  Tara Murphy is a 75 year old woman with a history of tobacco abuse, who was recently diagnosed with stage IV adenocarcinoma of the lung.  She had an isolated brain metastasis as well as a left upper lobe lung mass.  There was no evidence of any  other sites of disease including nodes or other distant metastases.  She was offered the option of surgical resection with the indications, risks, benefits, alternatives, and limitations discussed in detail with her.  She understood and accepted the risks  and agreed to proceed.  OPERATIVE NOTE:  Tara Murphy was brought to the preoperative holding area on 06/13/2021.  Anesthesia placed an arterial blood pressure monitoring line and a central venous catheter.  She was taken to the operating room, anesthetized, and intubated with a  double-lumen endotracheal tube.  Intravenous antibiotics were administered.  A Foley catheter was placed.  Sequential compression devices were placed on the calves for DVT prophylaxis.  She was placed in a right lateral decubitus position.  A Bair  Hugger was placed for active warming.  The left chest was prepped and draped in the usual sterile fashion.  Single lung ventilation of the right lung was initiated and was tolerated well throughout the  procedure.  A timeout was performed.  A solution containing 20 mL of liposomal bupivacaine, 30 mL of 0.5% bupivacaine, and 50 mL of saline was prepared.  This was used for local at the incision sites as well as for the intercostal nerve blocks. An incision was made  in the eighth interspace in the mid axillary line.  An 8 mm robotic port was placed.  A thoracoscope was advanced into the chest.  After confirming intrapleural placement, carbon dioxide was insufflated per protocol.  A 12 mm port was placed in the  eighth interspace anterior to the camera port. Intercostal nerve blocks then were performed from the third to the tenth interspace.  10 mL of the bupivacaine solution was injected into a subpleural plane at each level.  Two additional robotic ports were  placed in the eighth interspace, and then a 12 mm AirSeal port was placed in the tenth interspace posterolaterally.  The robot was deployed.  The camera arm was docked.  Targeting was performed.  The remaining arms were docked. The robotic instruments  were inserted for thoracoscopic visualization.  The inferior ligament was divided.  All lymph nodes that were encountered during the dissection were removed and sent as separate specimens for permanent pathology.  There was no level 9 node present, but a level 8 node was present in the paraesophageal  area.  This was removed.  The pleural reflection was divided at the hilum posteriorly, and a level 7 node was removed.  The pleura was cleared off the pulmonary artery posteriorly.  The lung then was retracted posteriorly, and the pleural reflection was  divided at the hilum anteriorly.  The aortopulmonary window was opened.  There were some relatively enlarged nodes in the level 5 and 10 locations.  These were not definitely malignant, but were concerning.  These nodes were removed and sent for  pathology.  The fissure then was inspected.  The pulmonary artery was visible relatively posteriorly.  The  posterior fissure was nearly complete.  Anteriorly, the fissure was incomplete.  The pleura overlying the pulmonary artery was incised, and the  fissure was completed posteriorly using bipolar cautery.  Additional lymph nodes were removed.  More anteriorly, the takeoff of the lingular segmental branches and the basilar lower lobe branches were identified, and at the bifurcation, level 11 nodes  were removed.  The fissure was completed anteriorly with sequential firings of the robotic stapler using blue cartridges.  There was no indication that the lung was unresectable; therefore, the superior pulmonary vein branches were encircled and  divided with the robotic stapler using a vascular cartridge.  The posterior branch was dissected out and divided, and the lingular branch was also divided with the vascular stapler.  There was a large anterior apical trunk. Anteriorly, a node was dissected  from this area and then this vessel was easily encircled and divided with the stapler, and then there finally was a small posterior branch that was the last to be divided. Upward traction was placed on the upper lobe. The bifurcation of the bronchus was  identified.  The robotic stapler was placed from a posterior approach and placed across the left upper lobe bronchus at its origin.  It was closed.  The test inflation showed aeration of the lower lobe.  The stapler was fired transecting the left upper  lobe bronchus. Left upper lobe then was placed into a 12 mm endoscopic retrieval bag and brought down through the inferior aspect of the chest.  The robotic instruments were removed.  The robot was undocked.  The anterior eighth interspace incision was  lengthened to approximately 4 cm.  The specimen then was removed through this incision and sent for frozen section of the bronchial margin, which subsequently returned with no tumor seen.  The vessel loop and all the sponges that had been placed during the  dissection were  removed.  The chest was copiously irrigated with saline.  A test inflation of 30 cm of water revealed no evidence of air leakage.  A 28-French Blake drain was placed through the original port incision and secured with #1 silk suture.   The remaining incisions were closed in standard fashion.  The chest tube was placed to a Pleur-Evac on waterseal.  The patient then was placed in a supine position.  She was extubated in the operating room and taken to the postanesthetic care unit in  good condition.  All sponge, needle, and instrument counts were correct at the end of the procedure.   ROH D: 06/13/2021 8:53:44 pm T: 06/13/2021 9:37:00 pm  JOB: [id number]/ [id number]

## 2021-06-13 NOTE — Hospital Course (Addendum)
HPI: At the time of surgical consultation   Tara Murphy is sent for consultation for a left upper lobe lung mass   Tara Murphy is a 75 year old woman with a history of tobacco abuse (quit 2000), stage IV adenocarcinoma of the lung, brain metastasis, left leg weakness, left foot drop, hyperlipidemia, breast cancer, arthritis, total hip replacement, major depression, peripheral neuropathy, irritable bowel syndrome, and osteopenia.  The patient smoked about a pack a day before quitting in 2000.  She been feeling poorly dating back to February 2022.  In June she developed a left foot drop and peripheral neuropathy.  She presented on 04/24/2021 with a seizure.  A CT showed a hemorrhagic lesion in the right parasagittal frontal parietal tumor measuring 4.6 x 1.6 cm with surrounding vasogenic edema.  CT of the chest showed a large left upper lobe lung mass.  Dr. Trenton Gammon did a craniotomy for resection on 04/30/2021.  She went to rehab afterwards.  She then had 3 treatments of stereotactic radiation.  PET/CT was recently done which showed hypermetabolic left upper lobe lung mass.  There is a 7 mm lesion in the right upper lobe that is too small to characterize.  There is no evidence of adenopathy or other metastases.   She currently is walking with a walker.  She still has weakness and foot drop of the left leg.  She is not having any chest pain, pressure, tightness, shortness of breath, cough, or wheezing.  She denies weight loss.  She does complain of decreased energy.  She also has had some swelling in her left leg which has been worse since she got home from rehab.  After reviewing the patient and all relevant studies Dr. Roxan Hockey felt she would be a candidate for proceeding with robotic assisted thoracoscopic surgery for left upper lobectomy.  Hospital course:  The patient was admitted electively and on 06/13/2021 she was taken to the operating room where she underwent a left robotic assisted  thoracoscopic surgical procedure for left upper lobe lobectomy and lymph node dissection.  She tolerated the procedure well and was taken to the postanesthesia care unit in stable condition.  Postoperative hospital course:  On postoperative day 1 the patient's respiratory status remained stable.  There is no air leak and on chest x-ray the lung was well expanded.  The tube was continued on waterseal.  The patient has a previous history of some neurological deficits from previous craniotomy with left-sided weakness and foot drop so PT is consulted to assist with mobility.  The patient does have an expected acute blood loss anemia which is mild and we are monitoring clinically.  It is very stable.  On postoperative day #2 her chest tube was removed.  She has been reevaluated by PT and they feel as though she would be a good candidate for inpatient CIR.

## 2021-06-13 NOTE — Discharge Summary (Addendum)
Physician Discharge Summary  Patient ID: Tara Murphy MRN: [id number] DOB/AGE: [birth year] 74 y.o.   PCP is Leamon Arnt, MD Referring Provider is Curt Bears, MD   Admit date: 06/13/2021   Discharge date: 06/17/2021  Admission Diagnoses:  Lung cancer, metastatic to brain   Discharge Diagnoses:  Lung cancer, metastatic to brain  Active Problems:   S/P lobectomy of lung  Patient Active Problem List   Diagnosis Date Noted   S/P lobectomy of lung 06/13/2021   Adjustment disorder    Cognitive impairment    Leukocytosis    Transaminitis    Postoperative pain    Left foot drop    Primary cancer of left upper lobe of lung ([facility]) 05/03/2021   Metastatic adenocarcinoma to brain ([employer]) 05/03/2021   Brain tumor ([facility]) 05/03/2021   Brain mass 04/25/2021   Seizure ([facility]) 04/24/2021   Pain of left hip joint 10/30/2020   Hepatic steatosis 06/08/2020   Former smoker, stopped smoking in distant past 10/05/2019   Osteopenia 09/07/2019   IBS (irritable bowel syndrome) 10/08/2018   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   Hammer toe 04/13/2018   Osteoarthritis of right foot 04/13/2018   GERD (gastroesophageal reflux disease) 11/20/2017   Familial hyperlipidemia 07/09/5101   Lichen plano-pilaris 11/20/2017   Restless leg syndrome 11/20/2017   Essential tremor 09/26/2016   OA (osteoarthritis) of knee 12/18/2015   OA (osteoarthritis) of hip 12/18/2015   Osteoarthritis of spine with radiculopathy, 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   History of Clostridium difficile colitis 06/16/2015   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 Cellucci is sent for consultation for a left upper lobe lung mass   Tara Murphy is a 75 year old  woman with a history of tobacco abuse (quit 2000), stage IV adenocarcinoma of the lung, brain metastasis, left leg weakness, left foot drop, hyperlipidemia, breast cancer, arthritis, total hip replacement, major depression, peripheral neuropathy, irritable bowel syndrome, and osteopenia.  The patient smoked about a pack a day before quitting in 2000.  She been feeling poorly dating back to February 2022.  In June she developed a left foot drop and peripheral neuropathy.  She presented on 04/24/2021 with a seizure.  A CT showed a hemorrhagic lesion in the right parasagittal frontal parietal tumor measuring 4.6 x 1.6 cm with surrounding vasogenic edema.  CT of the chest showed a large left upper lobe lung mass.  Dr. Trenton Gammon did a craniotomy for resection on 04/30/2021.  She went to rehab afterwards.  She then had 3 treatments of stereotactic radiation.  PET/CT was recently done which showed hypermetabolic left upper lobe lung mass.  There is a 7 mm lesion in the right upper lobe that is too small to characterize.  There is no evidence of adenopathy or other metastases.   She currently is walking with a walker.  She still has weakness and foot drop of the left leg.  She is not having any chest pain, pressure, tightness, shortness of breath, cough, or wheezing.  She denies weight loss.  She does complain of decreased energy.  She also has had some swelling in her left leg which has been worse since she got home from rehab.  After reviewing the patient and all relevant studies Dr. Roxan Hockey felt she would  be a candidate for proceeding with robotic assisted thoracoscopic surgery for left upper lobectomy.  Hospital Course:  The patient was admitted electively and on 06/13/2021 she was taken to the operating room where she underwent a left robotic assisted thoracoscopic surgical procedure for left upper lobe lobectomy and lymph node dissection.  She tolerated the procedure well and was taken to the postanesthesia  care unit in stable condition. Postoperative hospital course:  On postoperative day 1 the patient's respiratory status remained stable.  There is no air leak and on chest x-ray the lung was well expanded.  The tube was continued on waterseal.  The patient has a previous history of some neurological deficits from previous craniotomy with left-sided weakness and foot drop so PT is consulted to assist with mobility.  The patient does have an expected acute blood loss anemia which is mild and we are monitoring clinically.  It is very stable.  On postoperative day #2 her chest tube was removed.  She has been reevaluated by PT and they feel as though she would be a good candidate for inpatient CIR. CXR on 09/24 showed a small, stable eft pneumothorax. Follow up CXR on 09/25 was stable. She was surgically stable for discharge to CIR and was transferred when a bed bacame available on 06/20/21.  Discharged Condition: stable   Consults: None  Significant Diagnostic Studies:   Narrative & Impression  CLINICAL DATA:  Follow-up pneumothorax.   EXAM: CHEST - 2 VIEW   COMPARISON:  06/16/2021   FINDINGS: Stable cardiac enlargement. Postop change from partial left upper lobectomy noted. 7 mm left lateral pneumothorax is identified. This appears similar in volume when compared with the previous exam. No focal pulmonary opacities.   IMPRESSION: 1. No change in the appearance of left lateral pneumothorax. 2. Stable cardiac enlargement.     Electronically Signed   By: Kerby Moors M.D.   On: 06/17/2021 08:44   Narrative & Impression  CLINICAL DATA:  Status post partial lobectomy.   EXAM: CHEST - 2 VIEW   COMPARISON:  06/15/2021   FINDINGS: Stable cardiac enlargement. Postsurgical changes are noted within the left upper lobe. Thin lateral pneumothorax is noted overlying the left upper lung measuring approximately 4 mm in thickness. This is unchanged in appearance when compared with previous  exam from 06/15/21. Right lung is clear.   IMPRESSION: Stable small left-sided pneumothorax.     Electronically Signed   By: Kerby Moors M.D.   On: 06/16/2021 09:01   Narrative & Impression  CLINICAL DATA:  S/P lobectomy of lung Z90.2 (ICD-10-CM)   Encounter for chest tube removal Z46.82 (ICD-10-CM)   EXAM: CHEST - 1 VIEW SAME DAY   COMPARISON:  Same day chest radiograph.   FINDINGS: Postsurgical changes in the left lung with volume loss, similar. No new consolidation. Subtle vertical linear opacity along the lateral left lung. No visible pleural effusions. Similar cardiomegaly. Similar subcutaneous emphysema along the lower left chest wall.   IMPRESSION: 1. Subtle vertical linear opacity along the lateral left lung, which could represent a small pneumothorax or skin fold. Recommend repeat chest radiograph to evaluate for persistence. 2. Otherwise, similar postsurgical changes without acute cardiopulmonary disease     Electronically Signed   By: Margaretha Sheffield M.D.   On: 06/15/2021 15:16   Treatments:  Xi robotic-assisted left upper lobectomy, lymph node dissection, and intercostal nerve blocks levels 3 through 10 by Dr. Roxan Hockey on 06/13/2021.  PATHOLOGY:Final results pending   Discharge Exam: Blood pressure Marland Kitchen)  146/63, pulse 65, temperature 97.7 F (36.5 C), temperature source Oral, resp. rate 19, height 5\' 6"  (1.676 m), weight 93.9 kg, SpO2 96 %. Cardiovascular: RRR, Pulmonary: Clear to auscultation bilaterally; Abdomen: Soft, non tender, bowel sounds present. Extremities: No lower extremity edema. Wounds: Clean and dry.  No erythema or signs of infection. Ecchymosis under left breast and back  Disposition: Stable and discharged to CIR.   Allergies as of 06/20/2021       Reactions   Fluorescein Itching, Other (See Comments)   Contrast Media [iodinated Diagnostic Agents] Hives   Hydromorphone Hcl Nausea Only   Other Rash   Certain Band-Aids  don't agree with the patient's skin; hospital ID wristband causes rashes   Sulfamethoxazole-trimethoprim Hives   Fentanyl Other (See Comments)   Redness and flushing over body    Tape Rash   Certain Band-Aids don't agree with the patient's skin; hospital ID wristband causes rashes   Terbinafine And Related Hives, Rash        Medication List     STOP taking these medications    ciprofloxacin 500 MG tablet Commonly known as: Cipro       TAKE these medications    busPIRone 7.5 MG tablet Commonly known as: BUSPAR Take 1 tablet (7.5 mg total) by mouth 2 (two) times daily as needed (anxiety).   clobetasol 0.05 % external solution Commonly known as: TEMOVATE Apply 1 application topically daily as needed (irritation).   dexamethasone 2 MG tablet Commonly known as: DECADRON Take 1 tablet (2 mg total) by mouth every 12 (twelve) hours.   fluticasone 50 MCG/ACT nasal spray Commonly known as: FLONASE Place 1 spray into both nostrils daily as needed for allergies or rhinitis.   gabapentin 300 MG capsule Commonly known as: NEURONTIN TAKE TWO CAPSULES BY MOUTH TWICE A DAY What changed: when to take this   rOPINIRole 4 MG tablet Commonly known as: REQUIP Take 1 tablet (4 mg total) by mouth at bedtime.   traMADol 50 MG tablet Commonly known as: ULTRAM Take 1 tablet (50 mg total) by mouth every 6 (six) hours as needed (mild pain).       ASK your doctor about these medications    ciprofloxacin 500 MG tablet Commonly known as: Cipro Take 1 tablet (500 mg total) by mouth 2 (two) times daily for 3 doses. Ask about: Should I take this medication?        Follow-up Information     Melrose Nakayama, MD. Go on 06/26/2021.   Specialty: Cardiothoracic Surgery Why: Your appointment is at 10:30 AM on 06/26/2021.   Also, obtain a chest x-ray at [facility] 1/2-hour prior to appointment.  It is located in the same office complex on the first floor. Contact  information: [address] [place] [address] [phone]                 Signed: Antony Odea PA-C 06/20/2021, 10:32 AM

## 2021-06-13 NOTE — Transfer of Care (Signed)
Immediate Anesthesia Transfer of Care Note  Patient: Tara Murphy  Procedure(s) Performed: XI ROBOTIC ASSISTED THORASCOPY-LEFT UPPER LOBECTOMY (Left: Chest) INTERCOSTAL NERVE BLOCK (Left: Chest) LYMPH NODE DISSECTION (Left: Chest)  Patient Location: PACU  Anesthesia Type:General  Level of Consciousness: awake and alert   Airway & Oxygen Therapy: Patient Spontanous Breathing and Patient connected to nasal cannula oxygen  Post-op Assessment: Report given to RN and Post -op Vital signs reviewed and stable  Post vital signs: Reviewed and stable  Last Vitals:  Vitals Value Taken Time  BP 160/92 06/13/21 1349  Temp    Pulse 92 06/13/21 1351  Resp 17 06/13/21 1351  SpO2 95 % 06/13/21 1351  Vitals shown include unvalidated device data.  Last Pain:  Vitals:   06/13/21 0820  TempSrc:   PainSc: 0-No pain         Complications: No notable events documented.

## 2021-06-13 NOTE — Plan of Care (Signed)

## 2021-06-13 NOTE — Brief Op Note (Signed)
06/13/2021  1:10 PM  PATIENT:  Tara Murphy  75 y.o. female  PRE-OPERATIVE DIAGNOSIS:  LEFT UPPER LOBE LUNG CANCER  POST-OPERATIVE DIAGNOSIS:  LEFT UPPER LOBE LUNG CANCER  PROCEDURE:  Procedure(s): XI ROBOTIC ASSISTED THORASCOPY-LEFT UPPER LOBECTOMY (Left) INTERCOSTAL NERVE BLOCK (Left) LYMPH NODE DISSECTION (Left)  SURGEON:  Surgeon(s) and Role:    * Melrose Nakayama, MD - Primary  PHYSICIAN ASSISTANT: Sendy Pluta PA-C  ANESTHESIA:   general  EBL:  50 mL   BLOOD ADMINISTERED:none  DRAINS: (1 63 F) Blake drain(s) in the LEFT HEMITHORAX    LOCAL MEDICATIONS USED:  EXPAREL  SPECIMEN:  Source of Specimen:  LEFT UPPER LOBE AND LN SAMPLES  DISPOSITION OF SPECIMEN:  PATHOLOGY  COUNTS:  YES  TOURNIQUET:  * No tourniquets in log *  DICTATION: .PENDING   PLAN OF CARE: Admit to inpatient   PATIENT DISPOSITION:  PACU - hemodynamically stable.   Delay start of Pharmacological VTE agent (>24hrs) due to surgical blood loss or risk of bleeding: no  COMPLICATIONS: NO KNOWN

## 2021-06-13 NOTE — Anesthesia Procedure Notes (Signed)
Procedure Name: Intubation Date/Time: 06/13/2021 9:34 AM Performed by: Eligha Bridegroom, CRNA Pre-anesthesia Checklist: Patient identified, Emergency Drugs available, Suction available, Patient being monitored and Timeout performed Patient Re-evaluated:Patient Re-evaluated prior to induction Oxygen Delivery Method: Circle system utilized Induction Type: IV induction Laryngoscope Size: Mac and 4 Grade View: Grade II Endobronchial tube: Left and Double lumen EBT and 37 Fr Placement Confirmation: ETT inserted through vocal cords under direct vision, positive ETCO2 and breath sounds checked- equal and bilateral Secured at: 27 cm Tube secured with: Tape Dental Injury: Teeth and Oropharynx as per pre-operative assessment

## 2021-06-14 ENCOUNTER — Encounter (HOSPITAL_COMMUNITY): Payer: Self-pay | Admitting: Thoracic Surgery (Cardiothoracic Vascular Surgery)

## 2021-06-14 ENCOUNTER — Inpatient Hospital Stay (HOSPITAL_COMMUNITY): Payer: Medicare Other

## 2021-06-14 LAB — BASIC METABOLIC PANEL WITH GFR
Anion gap: 9 (ref 5–15)
BUN: 7 mg/dL — ABNORMAL LOW (ref 8–23)
CO2: 26 mmol/L (ref 22–32)
Calcium: 8.5 mg/dL — ABNORMAL LOW (ref 8.9–10.3)
Chloride: 100 mmol/L (ref 98–111)
Creatinine, Ser: 0.55 mg/dL (ref 0.44–1.00)
GFR, Estimated: >60 mL/min
Glucose, Bld: 144 mg/dL — ABNORMAL HIGH (ref 70–99)
Potassium: 3.8 mmol/L (ref 3.5–5.1)
Sodium: 135 mmol/L (ref 135–145)

## 2021-06-14 LAB — CBC
HCT: 35.2 % — ABNORMAL LOW (ref 36.0–46.0)
Hemoglobin: 11.9 g/dL — ABNORMAL LOW (ref 12.0–15.0)
MCH: 30.9 pg (ref 26.0–34.0)
MCHC: 33.8 g/dL (ref 30.0–36.0)
MCV: 91.4 fL (ref 80.0–100.0)
Platelets: 207 K/uL (ref 150–400)
RBC: 3.85 MIL/uL — ABNORMAL LOW (ref 3.87–5.11)
RDW: 13.6 % (ref 11.5–15.5)
WBC: 13.1 K/uL — ABNORMAL HIGH (ref 4.0–10.5)
nRBC: 0 % (ref 0.0–0.2)

## 2021-06-14 MED ORDER — CHLORHEXIDINE GLUCONATE CLOTH 2 % EX PADS: 6.0000 | MEDICATED_PAD | Freq: Every day | CUTANEOUS | Status: DC

## 2021-06-14 MED ORDER — ALBUTEROL SULFATE (2.5 MG/3ML) 0.083% IN NEBU
2.5000 mg | INHALATION_SOLUTION | RESPIRATORY_TRACT | Status: DC | PRN
  Administered 2021-06-16: 2.5 mg via RESPIRATORY_TRACT
  Filled 2021-06-14: qty 3

## 2021-06-14 NOTE — Anesthesia Postprocedure Evaluation (Signed)
Anesthesia Post Note  Patient: [name]  Procedure(s) Performed: XI ROBOTIC ASSISTED THORASCOPY-LEFT UPPER LOBECTOMY (Left: Chest) INTERCOSTAL NERVE BLOCK (Left: Chest) LYMPH NODE DISSECTION (Left: Chest)     Patient location during evaluation: PACU Anesthesia Type: General Level of consciousness: awake and alert Pain management: pain level controlled Vital Signs Assessment: post-procedure vital signs reviewed and stable Respiratory status: spontaneous breathing, nonlabored ventilation, respiratory function stable and patient connected to nasal cannula oxygen Cardiovascular status: blood pressure returned to baseline and stable Postop Assessment: no apparent nausea or vomiting Anesthetic complications: no   No notable events documented.        Tara Murphy

## 2021-06-14 NOTE — Evaluation (Signed)
Physical Therapy Evaluation Patient Details Name: Tara Murphy MRN: [id number] DOB: 12/02/1945 Today's Date: 06/14/2021  History of Present Illness  75 y.o. female was admitted on 9/21 for surgery for LUL mass, received lobectomy.  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  Pt had R parietal craniotomy after having seizure-like activity (L sided jerking movements).  PMHx:  brain tumor resection 04/30/2021, L breast cancer status postlumpectomy in 2015, left foot drop, PN, HLD, GERD, chronic anxiety/depression, herpes zoster, former smoker, RLS, IBS  Clinical Impression  Pt was seen for mobility on side of bed and to progress to gait in her room.  Her ability to stand and walk is hindered both by endurance/O2 sats, as well as PN and foot drop on LLE.   Pt is willing and able to make a good effort to move and would make a good CIR candidate.  Has recently been there after a craniotomy, and will request consideration for admission to get her back to a more independent status for gait and transfers.  Follow along with acute PT goals.      Recommendations for follow up therapy are one component of a multi-disciplinary discharge planning process, led by the attending physician.  Recommendations may be updated based on patient status, additional functional criteria and insurance authorization.  Follow Up Recommendations CIR    Equipment Recommendations  None recommended by PT    Recommendations for Other Services Rehab consult     Precautions / Restrictions Precautions Precautions: Fall Precaution Comments: chest tube, L UL lobectomy with support protective pillow Restrictions Weight Bearing Restrictions: No      Mobility  Bed Mobility Overal bed mobility: Needs Assistance Bed Mobility: Sidelying to Sit;Rolling;Sit to Sidelying Rolling: Min assist Sidelying to sit: Mod assist     Sit to  sidelying: Mod assist General bed mobility comments: mod to lift trunk from bed and mod to lift legs back to bed    Transfers Overall transfer level: Needs assistance Equipment used: Rolling walker (2 wheeled);1 person hand held assist Transfers: Sit to/from Stand Sit to Stand: Min assist;Mod assist         General transfer comment: mod to initiate and then min to steady in standing  Ambulation/Gait Ambulation/Gait assistance: Min assist;+2 physical assistance;+2 safety/equipment Gait Distance (Feet): 20 Feet Assistive device: Rolling walker (2 wheeled);1 person hand held assist Gait Pattern/deviations: Step-to pattern;Step-through pattern;Decreased stride length;Shuffle;Wide base of support Gait velocity: reduced   General Gait Details: donned her tennis shoes with help from therapist and daughter, then assisted with telemetry and chest tube to walk in her room once to the door.  Pt is mildly SOB but her O2 sat was 93% after gait  Stairs            Wheelchair Mobility    Modified Rankin (Stroke Patients Only)       Balance Overall balance assessment: Needs assistance Sitting-balance support: Feet supported;Single extremity supported Sitting balance-Leahy Scale: Fair     Standing balance support: Bilateral upper extremity supported;During functional activity Standing balance-Leahy Scale: Poor                               Pertinent Vitals/Pain Pain Assessment: Faces Faces Pain Scale: Hurts little more Pain Location: L chest from lobectomy and chest tube Pain Descriptors / Indicators: Grimacing;Guarding Pain Intervention(s): Monitored  during session;Repositioned;Premedicated before session    [place] expects to be discharged to:: Inpatient rehab Living Arrangements: Spouse/significant other Available Help at Discharge: Family;Available 24 hours/day Type of Home: House Home Access: Stairs to enter Entrance Stairs-Rails:  None Entrance Stairs-Number of Steps: 1 Home Layout: One level Home Equipment: Walker - 4 wheels;Grab bars - toilet;Shower seat - built Academic librarian Comments: has husband but is a back patient and cannot lift her    Prior Function Level of Independence: Needs assistance   Gait / Transfers Assistance Needed: used rollator and has sustained some falls  ADL's / Homemaking Assistance Needed: husband assisting her to bathe and dress        Hand Dominance   Dominant Hand: Right    Extremity/Trunk Assessment   Upper Extremity Assessment Upper Extremity Assessment: Overall WFL for tasks assessed    Lower Extremity Assessment Lower Extremity Assessment: LLE deficits/detail LLE Deficits / Details: general weakness and L foot drop LLE Coordination: decreased gross motor    Cervical / Trunk Assessment Cervical / Trunk Assessment: Other exceptions (new chest incision with chest tube for LUL lobectomy)  Communication   Communication: No difficulties  Cognition Arousal/Alertness: Awake/alert Behavior During Therapy: WFL for tasks assessed/performed Overall Cognitive Status: Within Functional Limits for tasks assessed                                        General Comments General comments (skin integrity, edema, etc.): pt is draining significantly in chest tube after her mobility was done, O2 sats were sustained for movement    Exercises     Assessment/Plan    PT Assessment Patient needs continued PT services  PT Problem List Decreased strength;Decreased activity tolerance;Decreased mobility;Decreased balance;Decreased coordination;Cardiopulmonary status limiting activity;Decreased skin integrity;Pain       PT Treatment Interventions DME instruction;Gait training;Functional mobility training;Therapeutic activities;Therapeutic exercise;Balance training;Neuromuscular re-education;Patient/family education    PT Goals (Current goals can be  found in the Care Plan section)  Acute Rehab PT Goals Patient Stated Goal: to walk more and get home PT Goal Formulation: With patient/family Time For Goal Achievement: 06/28/21 Potential to Achieve Goals: Good    Frequency Min 3X/week   Barriers to discharge Inaccessible home environment;Decreased caregiver support home with husband who will not be able to lift her    Co-evaluation               AM-PAC PT "6 Clicks" Mobility  Outcome Measure Help needed turning from your back to your side while in a flat bed without using bedrails?: A Little Help needed moving from lying on your back to sitting on the side of a flat bed without using bedrails?: A Lot Help needed moving to and from a bed to a chair (including a wheelchair)?: A Lot Help needed standing up from a chair using your arms (e.g., wheelchair or bedside chair)?: A Lot Help needed to walk in hospital room?: A Lot Help needed climbing 3-5 steps with a railing? : Total 6 Click Score: 12    End of Session Equipment Utilized During Treatment: Gait belt Activity Tolerance: Patient tolerated treatment well;Patient limited by fatigue;Treatment limited secondary to medical complications (Comment) Patient left: in bed;with call bell/phone within reach;with bed alarm set;with family/visitor present Nurse Communication: Mobility status PT Visit Diagnosis: Unsteadiness on feet (R26.81);Muscle weakness (generalized) (M62.81);Repeated falls (R29.6);Pain Pain - Right/Left: Left Pain - part of body:  (  chest)    Time: 0063-4949 PT Time Calculation (min) (ACUTE ONLY): 27 min   Charges:   PT Evaluation $PT Eval Moderate Complexity: 1 Mod PT Treatments $Gait Training: 8-22 mins       Ramond Dial 06/14/2021, 4:41 PM  Mee Hives, PT MS Acute Rehab Dept. Number: [place] and [place]

## 2021-06-14 NOTE — Plan of Care (Signed)

## 2021-06-14 NOTE — Progress Notes (Signed)
PT Cancellation Note  Patient Details Name: Tara Murphy MRN: [id number] DOB: Sep 12, 1946   Cancelled Treatment:    Reason Eval/Treat Not Completed: Other (comment).  Pt was just sitting down from getting up, and then had started lunch.  Retry as time and pt allow.   Ramond Dial 06/14/2021, 11:40 AM  Mee Hives, PT [facility] Acute Rehab Dept. Number: [place] and [place]

## 2021-06-14 NOTE — Progress Notes (Addendum)
      [address]       [place] [address]             [phone]      1 Day Post-Op Procedure(s) (LRB): XI ROBOTIC ASSISTED THORASCOPY-LEFT UPPER LOBECTOMY (Left) INTERCOSTAL NERVE BLOCK (Left) LYMPH NODE DISSECTION (Left) Subjective: Awake and alert, says pain is well controlled.  She is getting ready to order breakfast.  Objective: Vital signs in last 24 hours: Temp:  [97.2 F (36.2 C)-98.3 F (36.8 C)] 98.3 F (36.8 C) (09/22 0741) Pulse Rate:  [53-93] 86 (09/22 0741) Cardiac Rhythm: Normal sinus rhythm (09/21 1900) Resp:  [11-21] 19 (09/22 0741) BP: (132-166)/(66-92) 152/84 (09/22 0741) SpO2:  [95 %-99 %] 95 % (09/22 0741) Arterial Line BP: (156-184)/(69-83) 182/69 (09/21 1435) Weight:  [93.9 kg] 93.9 kg (09/21 0805)     Intake/Output from previous day: 09/21 0701 - 09/22 0700 In: 2853.7 [P.O.:597; I.V.:2156.7; IV Piggyback:100] Out: 4115 [Urine:3755; Blood:50; Chest Tube:310] Intake/Output this shift: No intake/output data recorded.   General appearance: alert, cooperative, and no distress Neurologic: left sided weakness Heart: regular rate and rhythm Lungs: Breath sounds are clear, no air leak with a chest tube on waterseal.  Minimal chest tube drainage.  Chest x-ray showing good expansion of the residual left lung. Abdomen: Soft and nontender Wound: Left chest incisions are dry  Lab Results: Recent Labs    06/11/21 1030 06/14/21 0430  WBC 10.4 13.1*  HGB 14.2 11.9*  HCT 41.3 35.2*  PLT 253 207   BMET:  Recent Labs    06/11/21 1030 06/14/21 0430  NA 134* 135  K 4.1 3.8  CL 102 100  CO2 20* 26  GLUCOSE 107* 144*  BUN 14 7*  CREATININE 0.51 0.55  CALCIUM 8.7* 8.5*    PT/INR:  Recent Labs    06/11/21 1030  LABPROT 13.3  INR 1.0   ABG    Component Value Date/Time   PHART 7.464 (H) 06/11/2021 1120   HCO3 21.5 06/11/2021 1120   TCO2 25 04/30/2021 0936   ACIDBASEDEF 1.7 06/11/2021 1120   O2SAT 98.5 06/11/2021 1120    CBG (last 3)  No results for input(s): GLUCAP in the last 72 hours.  Assessment/Plan: S/P Procedure(s) (LRB): XI ROBOTIC ASSISTED THORASCOPY-LEFT UPPER LOBECTOMY (Left) INTERCOSTAL NERVE BLOCK (Left) LYMPH NODE DISSECTION (Left)  -Postop day 1 robotic assisted left upper lobectomy for stage IV lung cancer.  Stable respiratory status.  The remaining left lung is well expanded on chest x-ray.  No air leak.  Leave the chest tube on waterseal for now. Remove the Foley catheter and central line.  Mobilize.  -Status post craniotomy in August of this year for resection and metastatic brain lesion.  She has some left-sided weakness and foot drop since that procedure.  We will ask PT to evaluate and assist with mobility.  -Heme expected acute blood loss anemia-mild, monitor.  -DVT prophylaxis-enoxaparin daily   LOS: 1 day    Malon Kindle [phone] 06/14/2021  Looks great this Am No air leak. About 400 ml since OR- will leave CT on water seal today, probably dc tomorrow Ambulate  Remo Lipps C. Roxan Hockey, MD [facility] [phone]

## 2021-06-15 ENCOUNTER — Inpatient Hospital Stay (HOSPITAL_COMMUNITY): Payer: Medicare Other

## 2021-06-15 LAB — CBC
HCT: 33.2 % — ABNORMAL LOW (ref 36.0–46.0)
Hemoglobin: 11.4 g/dL — ABNORMAL LOW (ref 12.0–15.0)
MCH: 31.3 pg (ref 26.0–34.0)
MCHC: 34.3 g/dL (ref 30.0–36.0)
MCV: 91.2 fL (ref 80.0–100.0)
Platelets: 182 10*3/uL (ref 150–400)
RBC: 3.64 MIL/uL — ABNORMAL LOW (ref 3.87–5.11)
RDW: 13.8 % (ref 11.5–15.5)
WBC: 9.9 10*3/uL (ref 4.0–10.5)
nRBC: 0 % (ref 0.0–0.2)

## 2021-06-15 LAB — COMPREHENSIVE METABOLIC PANEL
ALT: 70 U/L — ABNORMAL HIGH (ref 0–44)
AST: 21 U/L (ref 15–41)
Albumin: 2.6 g/dL — ABNORMAL LOW (ref 3.5–5.0)
Alkaline Phosphatase: 58 U/L (ref 38–126)
Anion gap: 4 — ABNORMAL LOW (ref 5–15)
BUN: 12 mg/dL (ref 8–23)
CO2: 29 mmol/L (ref 22–32)
Calcium: 8.3 mg/dL — ABNORMAL LOW (ref 8.9–10.3)
Chloride: 102 mmol/L (ref 98–111)
Creatinine, Ser: 0.57 mg/dL (ref 0.44–1.00)
GFR, Estimated: >60 mL/min (ref >60)
Glucose, Bld: 114 mg/dL — ABNORMAL HIGH (ref 70–99)
Potassium: 4.3 mmol/L (ref 3.5–5.1)
Sodium: 135 mmol/L (ref 135–145)
Total Bilirubin: 0.7 mg/dL (ref 0.3–1.2)
Total Protein: 4.7 g/dL — ABNORMAL LOW (ref 6.5–8.1)

## 2021-06-15 NOTE — Progress Notes (Addendum)
[address]       [place] [address]             [phone]      2 Days Post-Op Procedure(s) (LRB): XI ROBOTIC ASSISTED THORASCOPY-LEFT UPPER LOBECTOMY (Left) INTERCOSTAL NERVE BLOCK (Left) LYMPH NODE DISSECTION (Left) Subjective: Generally feels well  Objective: Vital signs in last 24 hours: Temp:  [97.7 F (36.5 C)-98.3 F (36.8 C)] 97.8 F (36.6 C) (09/23 0415) Pulse Rate:  [74-98] 84 (09/23 0415) Cardiac Rhythm: Normal sinus rhythm (09/22 1900) Resp:  [18-24] 18 (09/23 0415) BP: (139-152)/(65-98) 144/92 (09/23 0415) SpO2:  [92 %-95 %] 94 % (09/23 0415)  Hemodynamic parameters for last 24 hours:    Intake/Output from previous day: 09/22 0701 - 09/23 0700 In: 1560 [P.O.:1560] Out: 1640 [Urine:1350; Chest Tube:290] Intake/Output this shift: No intake/output data recorded.  General appearance: alert, cooperative, and no distress Heart: regular rate and rhythm Lungs: minor scattered crackles Abdomen: benign Extremities: no edema or calf tenderness Wound: incis healing well  Lab Results: Recent Labs    06/14/21 0430 06/15/21 0040  WBC 13.1* 9.9  HGB 11.9* 11.4*  HCT 35.2* 33.2*  PLT 207 182   BMET:  Recent Labs    06/14/21 0430 06/15/21 0040  NA 135 135  K 3.8 4.3  CL 100 102  CO2 26 29  GLUCOSE 144* 114*  BUN 7* 12  CREATININE 0.55 0.57  CALCIUM 8.5* 8.3*    PT/INR: No results for input(s): LABPROT, INR in the last 72 hours. ABG    Component Value Date/Time   PHART 7.464 (H) 06/11/2021 1120   HCO3 21.5 06/11/2021 1120   TCO2 25 04/30/2021 0936   ACIDBASEDEF 1.7 06/11/2021 1120   O2SAT 98.5 06/11/2021 1120   CBG (last 3)  No results for input(s): GLUCAP in the last 72 hours.  Meds Scheduled Meds:  acetaminophen  1,000 mg Oral Q6H   Or   acetaminophen (TYLENOL) oral liquid 160 mg/5 mL  1,000 mg Oral Q6H   bisacodyl  10 mg Oral Daily   ciprofloxacin  500 mg Oral BID   dexamethasone  2 mg Oral Q12H    enoxaparin (LOVENOX) injection  40 mg Subcutaneous Daily   gabapentin  600 mg Oral QHS   rOPINIRole  4 mg Oral QHS   senna-docusate  1 tablet Oral QHS   Continuous Infusions:  dextrose 5 % and 0.9% NaCl Stopped (06/14/21 1306)   PRN Meds:.albuterol, busPIRone, ketorolac, ondansetron (ZOFRAN) IV, oxyCODONE, traMADol  Xrays DG Chest Port 1 View  Result Date: 06/14/2021 CLINICAL DATA:  Status post left lung partial resection, chest pain EXAM: PORTABLE CHEST 1 VIEW COMPARISON:  06/13/2021 FINDINGS: Lung volumes are small. There is hyper lucency of the left lung in keeping with partial left lung resection. Large bore left chest tube is in place. No pneumothorax or pleural effusion. Subcutaneous gas seen at the left neck base and within the left chest wall. Right internal jugular temporary dialysis catheter tip noted within the superior cavoatrial junction. Mild cardiomegaly is stable. Fullness within the left hilum is likely postsurgical in nature. Pulmonary vascularity is normal. IMPRESSION: Pulmonary hypoinflation. Hyper lucency of the left lung in keeping with partial left lung resection. No pneumothorax. Left chest tube in place. Electronically Signed   By: Fidela Salisbury M.D.   On: 06/14/2021 09:24   DG Chest Port 1 View  Result Date: 06/13/2021 CLINICAL DATA:  Status post left upper lobe lobectomy, initial encounter  EXAM: PORTABLE CHEST 1 VIEW COMPARISON:  06/11/2021 FINDINGS: Check shadow is enlarged but accentuated by the frontal technique. Left-sided chest tube is noted without evidence of pneumothorax. Postsurgical changes are seen in the left upper lobe. Right lung shows mild basilar atelectasis. Central venous line is noted via the right jugular vein. No pneumothorax is seen. No bony abnormality is noted. IMPRESSION: Postoperative changes on the left with chest tube in place. No pneumothorax is noted. Mild right basilar atelectasis. Electronically Signed   By: Inez Catalina M.D.   On:  06/13/2021 16:00    Assessment/Plan: S/P Procedure(s) (LRB): XI ROBOTIC ASSISTED THORASCOPY-LEFT UPPER LOBECTOMY (Left) INTERCOSTAL NERVE BLOCK (Left) LYMPH NODE DISSECTION (Left)   1 afeb, VSS sBP 130's-150's 2 sats good on RA 3 CT 290 cc recorded 24/h- small air leak with initial cough, none on subsequent coughs- poss remove soon, drainage is serosang 4 good UOP- normal renal fxn 5 anemia is stable 6 leukocytosis resolved 7 low protein stores- encourage po intake 8 ALT mild elevation, other liver enzymes normal- monitor clinically 9 CXR appearance stable c/w previous film- no pntx 10 she benefited from previous rehab following brain surgery and PT recommends CIR - will place consult       LOS: 2 days    John Giovanni PA-C Pager [phone] 06/15/2021  Patient seen and examined, agree with above No air leak when I examined her- will dc chest tube Will consult CIR She will probably be ready to go to rehab tomorrow if bed available  Remo Lipps C. Roxan Hockey, MD [facility] [phone]   Obesity BMI 33.41  Revonda Standard. Roxan Hockey, MD [facility] [phone]

## 2021-06-15 NOTE — Care Management Important Message (Signed)
Important Message  Patient Details  Name: Tara Murphy MRN: [id number] Date of Birth: 1946-05-22   Medicare Important Message Given:  Yes     Angeles Zehner Montine Circle 06/15/2021, 2:51 PM

## 2021-06-15 NOTE — Progress Notes (Signed)
Physical Therapy Treatment Patient Details Name: Tara Murphy MRN: [id number] DOB: 10-04-1945 Today's Date: 06/15/2021   History of Present Illness 75 y.o. female was admitted on 9/21 for surgery for LUL mass, received lobectomy.  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  Pt had R parietal craniotomy after having seizure-like activity (L sided jerking movements).  PMHx:  brain tumor resection 04/30/2021, L breast cancer status postlumpectomy in 2015, left foot drop, PN, HLD, GERD, chronic anxiety/depression, herpes zoster, former smoker, RLS, IBS    PT Comments    The pt was able to demo good progress with mobility and activity tolerance this session. She remains highly motivated to improve OOB mobility and strength in LLE. Session focused on improving LLE strength through functional exercises with focus on activation of L hip extensors and quad for standing. The pt was then able to progress hallway ambulation with minA and use of RW, max cues for L foot DF and heel strike as well as posture and positioning in RW. Continue to recommend CIR level therapies at d/c.     Recommendations for follow up therapy are one component of a multi-disciplinary discharge planning process, led by the attending physician.  Recommendations may be updated based on patient status, additional functional criteria and insurance authorization.  Follow Up Recommendations  CIR     Equipment Recommendations  None recommended by PT    Recommendations for Other Services Rehab consult     Precautions / Restrictions Precautions Precautions: Fall Precaution Comments: L UL lobectomy with support protective pillow Restrictions Weight Bearing Restrictions: No     Mobility  Bed Mobility Overal bed mobility: Needs Assistance Bed Mobility: Sidelying to Sit;Rolling Rolling: Min assist Sidelying to sit: Min assist        General bed mobility comments: pt pulling on husband to pull to sit, minA to LLE to manage repositioning to sit EOB    Transfers Overall transfer level: Needs assistance Equipment used: Rolling walker (2 wheeled);1 person hand held assist Transfers: Sit to/from Stand Sit to Stand: Min assist;From elevated surface (slightly elevated to where hips are just above knees)         General transfer comment: x15 sit-stand from EOB pt needing up to modA from significantly lowered bed, minA from EOB raised to height with knees slightly below hips. The pt was cued to increase wt shift to L through staggering R foot infront of L, pushing from the bed with LUE, and minA to keep hips in neutral position. minA to L knee to maintain in neutrol rather than collapsing in towards R knee  Ambulation/Gait Ambulation/Gait assistance: Min assist Gait Distance (Feet): 40 Feet Assistive device: Rolling walker (2 wheeled) Gait Pattern/deviations: Step-through pattern;Decreased stance time - left;Decreased dorsiflexion - left;Decreased weight shift to left Gait velocity: reduced Gait velocity interpretation: <1.31 ft/sec, indicative of household ambulator General Gait Details: pt cues to increase L DF and heel strike, VSS on RA with HR to 105 max       Balance Overall balance assessment: Needs assistance Sitting-balance support: Feet supported Sitting balance-Leahy Scale: Fair Sitting balance - Comments: static sitting without UE support   Standing balance support: Bilateral upper extremity supported;No upper extremity supported Standing balance-Leahy Scale: Fair Standing balance comment: static standing withotu UE support, BUE support for gait  Cognition Arousal/Alertness: Awake/alert Behavior During Therapy: WFL for tasks assessed/performed Overall Cognitive Status: Within Functional Limits for tasks assessed                                         Exercises      General Comments General comments (skin integrity, edema, etc.): VSS on RA, HR 109 max      Pertinent Vitals/Pain Pain Assessment: Faces Faces Pain Scale: Hurts a little bit Pain Location: incision site Pain Descriptors / Indicators: Grimacing;Guarding Pain Intervention(s): Monitored during session;Limited activity within patient's tolerance     PT Goals (current goals can now be found in the care plan section) Acute Rehab PT Goals Patient Stated Goal: to walk more and get home PT Goal Formulation: With patient/family Time For Goal Achievement: 06/28/21 Potential to Achieve Goals: Good Progress towards PT goals: Progressing toward goals    Frequency    Min 3X/week      PT Plan Current plan remains appropriate       AM-PAC PT "6 Clicks" Mobility   Outcome Measure  Help needed turning from your back to your side while in a flat bed without using bedrails?: A Little Help needed moving from lying on your back to sitting on the side of a flat bed without using bedrails?: A Lot Help needed moving to and from a bed to a chair (including a wheelchair)?: A Lot Help needed standing up from a chair using your arms (e.g., wheelchair or bedside chair)?: A Little Help needed to walk in hospital room?: A Little Help needed climbing 3-5 steps with a railing? : A Lot 6 Click Score: 15    End of Session Equipment Utilized During Treatment: Gait belt Activity Tolerance: Patient tolerated treatment well;Patient limited by fatigue;Treatment limited secondary to medical complications (Comment) Patient left: in chair;with call bell/phone within reach;with chair alarm set Nurse Communication: Mobility status PT Visit Diagnosis: Unsteadiness on feet (R26.81);Muscle weakness (generalized) (M62.81);Repeated falls (R29.6);Pain     Time: 6295-2841 PT Time Calculation (min) (ACUTE ONLY): 37 min  Charges:  $Gait Training: 8-22 mins $Therapeutic Exercise: 8-22  mins                     West Carbo, PT, DPT   Acute Rehabilitation Department Pager #: [phone]   Sandra Cockayne 06/15/2021, 5:01 PM

## 2021-06-16 ENCOUNTER — Inpatient Hospital Stay (HOSPITAL_COMMUNITY): Payer: Medicare Other

## 2021-06-16 MED ORDER — CIPROFLOXACIN HCL 500 MG PO TABS: 500.0000 mg | ORAL_TABLET | Freq: Two times a day (BID) | ORAL | 0 refills | Status: AC

## 2021-06-16 MED ORDER — POLYETHYLENE GLYCOL 3350 17 G PO PACK
17.0000 g | PACK | Freq: Every day | ORAL | Status: DC
Start: 2021-06-16 — End: 2021-06-20
  Administered 2021-06-16 – 2021-06-20 (×4): 17 g via ORAL
  Filled 2021-06-16 (×6): qty 1

## 2021-06-16 MED ORDER — TRAMADOL HCL 50 MG PO TABS: 50.0000 mg | ORAL_TABLET | Freq: Four times a day (QID) | ORAL | Status: DC | PRN

## 2021-06-16 NOTE — Evaluation (Signed)
Occupational Therapy Evaluation Patient Details Name: Tara Murphy MRN: [id number] DOB: [birth year] Today's Date: 06/16/2021   History of Present Illness 75 y.o. female was admitted on 9/21 for surgery for LUL mass, received lobectomy.  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  Pt had R parietal craniotomy after having seizure-like activity (L sided jerking movements).  PMHx:  brain tumor resection 04/30/2021, L breast cancer status postlumpectomy in 2015, left foot drop, PN, HLD, GERD, chronic anxiety/depression, herpes zoster, former smoker, RLS, IBS   Clinical Impression   PTA patient reports using rollator for mobility and some assist for LB ADLs.  Admitted for above and limited by problem list below, including impaired balance, decreased activity tolerance, L LE weakness, and fear of falling.  Pt reports several falls PTA and is fearful of falling, fatigues easily.  She completes transfers and mobility in room using RW with min assist, up to min assist for ADLs.  Pt will benefit from continued OT services while admitted and after dc at CIR level to optimize independence, safety with ADLs, IADLs and mobility.      Recommendations for follow up therapy are one component of a multi-disciplinary discharge planning process, led by the attending physician.  Recommendations may be updated based on patient status, additional functional criteria and insurance authorization.   Follow Up Recommendations  CIR;Supervision/Assistance - 24 hour    Equipment Recommendations  Other (comment) (TBD)    Recommendations for Other Services       Precautions / Restrictions Precautions Precautions: Fall Precaution Comments: L UL lobectomy with support protective pillow Restrictions Weight Bearing Restrictions: No      Mobility Bed Mobility               General bed mobility comments: OOB in  recliner upon entry    Transfers Overall transfer level: Needs assistance Equipment used: Rolling walker (2 wheeled);1 person hand held assist Transfers: Sit to/from Stand Sit to Stand: Min assist         General transfer comment: min assist from recliner to power up and steady    Balance Overall balance assessment: Needs assistance Sitting-balance support: Feet supported Sitting balance-Leahy Scale: Fair Sitting balance - Comments: static sitting without UE support   Standing balance support: Bilateral upper extremity supported;No upper extremity supported;During functional activity Standing balance-Leahy Scale: Fair Standing balance comment: relies on BUE support dynaimcally, min guard to min assist without UE support                           ADL either performed or assessed with clinical judgement   ADL Overall ADL's : Needs assistance/impaired     Grooming: Min guard;Standing           Upper Body Dressing : Set up;Sitting   Lower Body Dressing: Minimal assistance;Sit to/from stand   Toilet Transfer: Minimal assistance;Ambulation;RW Toilet Transfer Details (indicate cue type and reason): simulated in room Toileting- Clothing Manipulation and Hygiene: Minimal assistance;Sit to/from stand       Functional mobility during ADLs: Minimal assistance;Rolling walker       Vision         Perception     Praxis      Pertinent Vitals/Pain Pain Assessment: Faces Faces Pain Scale: Hurts a little bit Pain Location: incision site Pain Descriptors / Indicators: Grimacing;Guarding Pain Intervention(s): Limited activity within  patient's tolerance;Monitored during session;Repositioned     Hand Dominance Right   Extremity/Trunk Assessment Upper Extremity Assessment Upper Extremity Assessment: Overall WFL for tasks assessed   Lower Extremity Assessment Lower Extremity Assessment: Defer to PT evaluation       Communication  Communication Communication: No difficulties   Cognition Arousal/Alertness: Awake/alert Behavior During Therapy: WFL for tasks assessed/performed Overall Cognitive Status: Within Functional Limits for tasks assessed                                     General Comments  VSS on RA    Exercises     Shoulder Instructions      Home Living Family/patient expects to be discharged to:: Private residence Living Arrangements: Spouse/significant other Available Help at Discharge: Family;Available 24 hours/day Type of Home: House Home Access: Stairs to enter [employer] of Steps: 1 Entrance Stairs-Rails: None Home Layout: One level     Bathroom Shower/Tub: Occupational psychologist: Standard     Home Equipment: Walker - 4 wheels;Grab bars - toilet;Shower seat - built Hotel manager: [employer]acher        Prior Functioning/Environment Level of Independence: Needs assistance  Gait / Transfers Assistance Needed: used rollator for mobility ADL's / Homemaking Assistance Needed: some assist with ADLs (LB)            OT Problem List: Decreased strength;Decreased activity tolerance;Impaired balance (sitting and/or standing);Decreased safety awareness;Decreased knowledge of use of DME or AE;Decreased knowledge of precautions;Obesity      OT Treatment/Interventions: Self-care/ADL training;DME and/or AE instruction;Therapeutic exercise;Therapeutic activities;Patient/family education;Balance training;Energy conservation    OT Goals(Current goals can be found in the care plan section) Acute Rehab OT Goals Patient Stated Goal: to get stronger and get home more confident with walking and self care, "to cook a meal" OT Goal Formulation: With patient Time For Goal Achievement: 06/30/21 Potential to Achieve Goals: Good  OT Frequency: Min 2X/week   Barriers to D/C:            Co-evaluation              AM-PAC OT "6 Clicks"  Daily Activity     Outcome Measure Help from another person eating meals?: None Help from another person taking care of personal grooming?: A Little Help from another person toileting, which includes using toliet, bedpan, or urinal?: A Little Help from another person bathing (including washing, rinsing, drying)?: A Little Help from another person to put on and taking off regular upper body clothing?: None Help from another person to put on and taking off regular lower body clothing?: A Little 6 Click Score: 20   End of Session Equipment Utilized During Treatment: Rolling walker Nurse Communication: Mobility status  Activity Tolerance: Patient tolerated treatment well Patient left: in chair;with call bell/phone within reach  OT Visit Diagnosis: Other abnormalities of gait and mobility (R26.89);Muscle weakness (generalized) (M62.81);History of falling (Z91.81)                Time: 5409-8119 OT Time Calculation (min): 18 min Charges:  OT General Charges $OT Visit: 1 Visit OT Evaluation $OT Eval Moderate Complexity: 1 Mod  Jolaine Artist, OT Acute Rehabilitation Services Pager [phone] Office [phone]   Delight Stare 06/16/2021, 3:20 PM

## 2021-06-16 NOTE — Progress Notes (Signed)
Inpatient Rehab Admissions Coordinator:   I met with patient at bedside to discuss potential CIR admission. Pt. Stated interest. I will reach out to family to confirm support. I  Will pursue for potential admit,  pending bed availability and medical readiness.  Clemens Catholic, [place], [place] Admissions Coordinator  [phone] ([place]) [phone] (office)

## 2021-06-16 NOTE — PMR Pre-admission (Signed)
PMR Admission Coordinator Pre-Admission Assessment  Patient: Tara Murphy is an 75 y.o., female MRN: [id number] DOB: 09/28/1945 Height: 5' 6" (167.6 cm) Weight: 93.9 kg  Insurance Information HMO:     PPO:      PCP:      IPA:      80/20: ye     OTHER:  PRIMARY: Medicare Part A and B      Policy#: [id number]           Subscriber: Pt. Phone#: Verified online    Fax#:  Pre-Cert#:       Employer:  Benefits:  Phone #:      Name:  Eff. Date: Parts A ad B effective 12/23/2011  Deduct: $1556      Out of Pocket Max:  None      Life Max: N/A  CIR: 100%      SNF: 100 days Outpatient: 80%     Co-Pay: 20% Home Health: 100%      Co-Pay: none DME: 80%     Co-Pay: 20% Providers: patient's choice SECONDARY: [employer]      Policy#: [id number]     Phone#:   Financial Counselor:       Phone#:   The "Data Collection Information Summary" for patients in Inpatient Rehabilitation Facilities with attached "Privacy Act [place] Records" was provided and verbally reviewed with: Patient  Emergency Contact Information Contact Information     Name Relation Home Work [place] [phone]  [phone]   Sowell,Shannon Daughter [phone]  [phone]       Current Medical History  Patient Admitting Diagnosis: Lobectomy 2/2 LUL mass History of Present Illness: Tara Murphy is a 75 year old woman with a history of tobacco abuse (quit 2000), stage IV adenocarcinoma of the lung, brain metastasis, left leg weakness, left foot drop, hyperlipidemia, breast cancer, arthritis, total hip replacement, major depression, peripheral neuropathy, irritable bowel syndrome, and osteopenia.She been feeling poorly dating back to February 2022.  In June she developed a left foot drop and peripheral neuropathy.  She presented on 04/24/2021 with a seizure.  A CT showed a hemorrhagic lesion in the right parasagittal frontal parietal tumor measuring 4.6 x 1.6 cm with surrounding vasogenic edema.  CT  of the chest showed a large left upper lobe lung mass.  Dr. Trenton Gammon did a craniotomy for resection on 04/30/2021.  She went to rehab afterwards.  She then had 3 treatments of stereotactic radiation. PET/CT was recently done which showed hypermetabolic left upper lobe lung mass.  There is a 7 mm lesion in the right upper lobe that is too small to characterize.  There is no evidence of adenopathy or other metastases. On 06/13/21, Pt. Underwent Xi robotic-assisted left upper lobectomy with lymph node dissection. CIR was consulted post-op to assist Pt. In return to PLOF.     Patient's medical record from [facility] has been reviewed by the rehabilitation admission coordinator and physician.  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[place])    S/p total hip replacement left   Breast cancer ([place]) 2015   left    Cataract    Cholecystolithiasis    Chronic allergic rhinitis    Depression    Former smoker, stopped smoking in distant past 10/05/2019   Quit 2000; 50 y smoking history   GERD (gastroesophageal reflux disease)    Hepatic steatosis 06/08/2020  Intermittent elevated LFTs and ultrasound 2018   Hyperlipemia    IBS (irritable bowel syndrome) 10/08/2018   Nl colonoscopy and EGD 09/2018   IBS (irritable bowel syndrome)    Lichen plano-pilaris    Major depression, 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 Repeat 2020   Urinary tract infection    hx of frequent     Has the patient had major surgery during 100 days prior to admission? Yes  Family History   family history includes Arthritis in her father and mother;  Breast cancer (age of onset: 35) in her sister; Crohn's disease in her mother; Diabetes in her mother; Hyperlipidemia in her maternal aunt; Lung cancer in her father; Stomach cancer in her maternal grandmother.  Current Medications  Current Facility-Administered Medications:    acetaminophen (TYLENOL) tablet 1,000 mg, 1,000 mg, Oral, Q6H, 1,000 mg at 06/16/21 1321 **OR** acetaminophen (TYLENOL) 160 MG/5ML solution 1,000 mg, 1,000 mg, Oral, Q6H, Gold, Wayne E, PA-C   albuterol (PROVENTIL) (2.5 MG/3ML) 0.083% nebulizer solution 2.5 mg, 2.5 mg, Nebulization, Q4H PRN, Melrose Nakayama, MD   bisacodyl (DULCOLAX) EC tablet 10 mg, 10 mg, Oral, Daily, Gold, Wayne E, PA-C, 10 mg at 06/16/21 0943   busPIRone (BUSPAR) tablet 7.5 mg, 7.5 mg, Oral, BID PRN, Gold, Wayne E, PA-C   ciprofloxacin (CIPRO) tablet 500 mg, 500 mg, Oral, BID, Gold, Wayne E, PA-C, 500 mg at 06/16/21 0943   dexamethasone (DECADRON) tablet 2 mg, 2 mg, Oral, Q12H, Gold, Wayne E, PA-C, 2 mg at 06/16/21 0943   dextrose 5 %-0.9 % sodium chloride infusion, , Intravenous, Continuous, Roddenberry, Myron G, PA-C, Stopped at 06/14/21 1306   enoxaparin (LOVENOX) injection 40 mg, 40 mg, Subcutaneous, Daily, Gold, Wayne E, PA-C, 40 mg at 06/16/21 0943   gabapentin (NEURONTIN) capsule 600 mg, 600 mg, Oral, QHS, Gold, Wayne E, PA-C, 600 mg at 06/15/21 2057   ondansetron (ZOFRAN) injection 4 mg, 4 mg, Intravenous, Q6H PRN, Gold, Wayne E, PA-C, 4 mg at 06/13/21 1640   oxyCODONE (Oxy IR/ROXICODONE) immediate release tablet 5-10 mg, 5-10 mg, Oral, Q4H PRN, Gold, Wayne E, PA-C, 10 mg at 06/15/21 0924   polyethylene glycol (MIRALAX / GLYCOLAX) packet 17 g, 17 g, Oral, Daily, Darriona, Dehaas M, PA-C, 17 g at 06/16/21 0943   rOPINIRole (REQUIP) tablet 4 mg, 4 mg, Oral, QHS, Gold, Wayne E, PA-C, 4 mg at 06/15/21 2056   senna-docusate (Senokot-S) tablet 1 tablet, 1 tablet, Oral, QHS, Gold, Wayne E, PA-C, 1 tablet at 06/15/21 2057   traMADol (ULTRAM)  tablet 50-100 mg, 50-100 mg, Oral, Q6H PRN, Gold, Wayne E, PA-C, 100 mg at 06/14/21 1357  Patients Current Diet:  Diet Order             Diet Heart Room service appropriate? Yes; Fluid consistency: Thin  Diet effective now                   Precautions / Restrictions Precautions Precautions: Fall Precaution Comments: L UL lobectomy with support protective pillow Restrictions Weight Bearing Restrictions: No   Has the patient had 2 or more falls or a fall with injury in the past year? Yes  Prior Activity Level Household: pt. went  out less than once a week  Prior Functional Level Self Care: Did the patient need help bathing, dressing, using the toilet or eating? Needed some help  Indoor Mobility: Did the patient need assistance with walking from room to room (with or without device)? Needed some help  Stairs: Did the patient need assistance with internal or external stairs (with or without device)? Needed some help  Functional Cognition: Did the patient need help planning regular tasks such as shopping or remembering to take medications? Needed some help  Patient Information Are you of Hispanic, Latino/a,or Spanish origin?: A. No, not of Hispanic, Latino/a, or Spanish origin What is your race?: A. White Do you need or want an interpreter to communicate with a doctor or health care staff?: 0. No  Patient's Response To:  Health Literacy and Transportation Is the patient able to respond to health literacy and transportation needs?: Yes Health Literacy - How often do you need to have someone help you when you read instructions, pamphlets, or other written material from your doctor or pharmacy?: Never In the past 12 months, has lack of transportation kept you from medical appointments or from getting medications?: No In the past 12 months, has lack of transportation kept you from meetings, work, or from getting things needed for daily living?: No  Home Assistive Devices /  Equipment Home Equipment: Environmental consultant - 4 wheels, Grab bars - toilet, Shower seat - built in, [employer] equipment  Prior Device Use: Indicate devices/aids used by the patient prior to current illness, exacerbation or injury? Walker  Current Functional Level Cognition  Overall Cognitive Status: Within Functional Limits for tasks assessed Orientation Level: Oriented X4    Extremity Assessment (includes Sensation/Coordination)  Upper Extremity Assessment: Overall WFL for tasks assessed  Lower Extremity Assessment: Defer to PT evaluation LLE Deficits / Details: general weakness and L foot drop LLE Coordination: decreased gross motor    ADLs  Overall ADL's : Needs assistance/impaired Grooming: Min guard, Standing Upper Body Dressing : Set up, Sitting Lower Body Dressing: Minimal assistance, Sit to/from stand Toilet Transfer: Minimal assistance, Ambulation, RW Toilet Transfer Details (indicate cue type and reason): simulated in room Toileting- Clothing Manipulation and Hygiene: Minimal assistance, Sit to/from stand Functional mobility during ADLs: Minimal assistance, Rolling walker    Mobility  Overal bed mobility: Needs Assistance Bed Mobility: Sidelying to Sit, Rolling Rolling: Min assist Sidelying to sit: Min assist Sit to sidelying: Mod assist General bed mobility comments: OOB in recliner upon entry    Transfers  Overall transfer level: Needs assistance Equipment used: Rolling walker (2 wheeled), 1 person hand held assist Transfers: Sit to/from Stand Sit to Stand: Min assist General transfer comment: min assist from recliner to power up and steady    Ambulation / Gait / Stairs / Wheelchair Mobility  Ambulation/Gait Ambulation/Gait assistance: Herbalist (Feet): 40 Feet Assistive device: Rolling walker (2 wheeled) Gait Pattern/deviations: Step-through pattern, Decreased stance time - left, Decreased dorsiflexion - left, Decreased weight shift to left General Gait  Details: pt cues to increase L DF and heel strike, VSS on RA with HR to 105 max Gait velocity: reduced Gait velocity interpretation: <1.31 ft/sec, indicative of household ambulator    Posture / Balance Dynamic Sitting Balance Sitting balance - Comments: static sitting without UE support Balance Overall balance assessment: Needs assistance Sitting-balance support: Feet supported Sitting balance-Leahy Scale: Fair Sitting balance - Comments: static sitting without UE support Standing balance support: Bilateral upper extremity supported, No upper extremity supported, During  functional activity Standing balance-Leahy Scale: Fair Standing balance comment: relies on BUE support dynaimcally, min guard to min assist without UE support    Special needs/care consideration Skin surgical incision, Ecchymosis on L chest   Previous Home Environment (from acute therapy documentation) Living Arrangements: Spouse/significant other Available Help at Discharge: Family, Available 24 hours/day Type of Home: House Home Layout: One level Home Access: Stairs to enter Entrance Stairs-Rails: None Entrance Stairs-Number of Steps: 1 Bathroom Shower/Tub: Estate agent Accessibility: Yes Additional Comments: has husband but is a back patient and cannot lift her  Discharge Living Setting Plans for Discharge Living Setting: Patient's home Type of Home at Discharge: House Discharge Home Layout: One level Discharge Home Access: Stairs to enter [place]: None Entrance Stairs-Number of Steps: 1 Discharge Bathroom Shower/Tub: Walk-in shower Discharge Bathroom Toilet: Standard Discharge [place] Accessibility: Yes How Accessible: Accessible via walker  Social/Family/Support Systems Patient Roles: Spouse Contact Information: [phone] Anticipated Caregiver: Liylah Najarro Anticipated Caregiver's Contact Information: [phone] Caregiver Availability:  24/7 Discharge Plan Discussed with Primary Caregiver: Yes Is Caregiver In Agreement with Plan?: Yes Does Caregiver/Family have Issues with Lodging/Transportation while Pt is in Rehab?: No  Goals Patient/Family Goal for Rehab: PT/OT Supervision Expected length of stay: 8-10 days Pt/Family Agrees to Admission and willing to participate: Yes Program Orientation Provided & Reviewed with Pt/Caregiver Including Roles  & Responsibilities: Yes  Decrease burden of Care through IP rehab admission: Specialzed equipment needs, Decrease number of caregivers, Bowel and bladder program, and Patient/family education  Possible need for SNF placement upon discharge: not anticipated  Patient Condition: I have reviewed medical records from [facility], spoken with CM, and patient and spouse. I met with patient at the bedside for inpatient rehabilitation assessment.  Patient will benefit from ongoing PT and OT, can actively participate in 3 hours of therapy a day 5 days of the week, and can make measurable gains during the admission.  Patient will also benefit from the coordinated team approach during an Inpatient Acute Rehabilitation admission.  The patient will receive intensive therapy as well as Rehabilitation physician, nursing, social worker, and care management interventions.  Due to safety, skin/wound care, disease management, medication administration, pain management, and patient education the patient requires 24 hour a day rehabilitation nursing.  The patient is currently min A with mobility and basic ADLs.  Discharge setting and therapy post discharge at home with home health is anticipated.  Patient has agreed to participate in the Acute Inpatient Rehabilitation Program and will admit today.  Preadmission Screen Completed By:  Genella Mech, 06/16/2021 3:31 PM ______________________________________________________________________   Discussed status with Dr. Posey Pronto on 06/20/21  At 930 and  received approval for admission today.  Admission Coordinator:  Genella Mech, CCC-SLP, time 1100 /Date 06/20/21   Assessment/Plan: Diagnosis: Lobectomy 2/2 LUL mass Does the need for close, 24 hr/day Medical supervision in concert with the patient's rehab needs make it unreasonable for this patient to be served in a less intensive setting? Yes Co-Morbidities requiring supervision/potential complications: tobacco abuse (quit 2000), stage IV adenocarcinoma of the lung, brain metastasis, left leg weakness, left foot drop, hyperlipidemia, breast cancer, arthritis, total hip replacement, major depression, peripheral neuropathy, irritable bowel syndrome, and osteopenia Due to safety, skin/wound care, disease management, and patient education, does the patient require 24 hr/day rehab nursing? Yes Does the patient require coordinated care of a physician, rehab nurse, PT, OT to address physical and functional deficits in the context of the above  medical diagnosis(es)? Yes Addressing deficits in the following areas: balance, endurance, locomotion, strength, transferring, bathing, dressing, toileting, and psychosocial support Can the patient actively participate in an intensive therapy program of at least 3 hrs of therapy 5 days a week? Yes The potential for patient to make measurable gains while on inpatient rehab is good Anticipated functional outcomes upon discharge from inpatient rehab: modified independent and supervision PT, modified independent and supervision OT, n/a SLP Estimated rehab length of stay to reach the above functional goals is: 6-9 days. Anticipated discharge destination: Home 10. Overall Rehab/Functional Prognosis: good   MD Signature: Delice Lesch, MD, ABPMR

## 2021-06-16 NOTE — Discharge Instructions (Addendum)
ACTIVITY:  1.Increase activity slowly. 2.Walk daily and increase frequency and duration as tolerates. 3.May walk up steps. 4.No lifting more than ten pounds for two weeks. 5.No driving for two weeks. 6.Avoid straining. 7.STOP any activity that causes chest pain, shortness of breath, dizziness,sweating,     or excessive weakness. 8.Continue with breathing exercises daily.  DIET:  Regular diet   WOUND:  1.May shower. 2.Clean wounds with mild soap and water.  Call the office at [phone] if any problems arise.

## 2021-06-16 NOTE — Progress Notes (Addendum)
      [address]       [place] [address]             [phone]       3 Days Post-Op Procedure(s) (LRB): XI ROBOTIC ASSISTED THORASCOPY-LEFT UPPER LOBECTOMY (Left) INTERCOSTAL NERVE BLOCK (Left) LYMPH NODE DISSECTION (Left)  Subjective: Patient with complaints of constipation. She states her breathing is fine.  Objective: Vital signs in last 24 hours: Temp:  [97.9 F (36.6 C)-98 F (36.7 C)] 97.9 F (36.6 C) (09/24 0746) Pulse Rate:  [71-96] 71 (09/24 0746) Cardiac Rhythm: Normal sinus rhythm (09/24 0729) Resp:  [16-20] 16 (09/24 0746) BP: (130-152)/(67-82) 130/71 (09/24 0746) SpO2:  [94 %-97 %] 97 % (09/24 0746)      Intake/Output from previous day: 09/23 0701 - 09/24 0700 In: 53 [P.O.:960] Out: -    Physical Exam:  Cardiovascular: RRR, Pulmonary: Clear to auscultation bilaterally; Abdomen: Soft, non tender, bowel sounds present. Extremities: No lower extremity edema. Wounds: Clean and dry.  No erythema or signs of infection. Ecchymosis under left breast and back   Lab Results: CBC: Recent Labs    06/14/21 0430 06/15/21 0040  WBC 13.1* 9.9  HGB 11.9* 11.4*  HCT 35.2* 33.2*  PLT 207 182   BMET:  Recent Labs    06/14/21 0430 06/15/21 0040  NA 135 135  K 3.8 4.3  CL 100 102  CO2 26 29  GLUCOSE 144* 114*  BUN 7* 12  CREATININE 0.55 0.57  CALCIUM 8.5* 8.3*    PT/INR: No results for input(s): LABPROT, INR in the last 72 hours. ABG:  INR: Will add last result for INR, ABG once components are confirmed Will add last 4 CBG results once components are confirmed  Assessment/Plan:  1. CV - SR with HR in the 70's. 2.  Pulmonary - On room air. Encourage incentive spirometer. CXR this am appears stable (small, stable left pneumothorax) . Await final pathology. 3. Expected post op blood loss anemia-Last H and H stable at 11.4 and 33.2. 4. LOC constipation. Patient with a history of IBS and asking for Miralax so will order 5.  To CIR when bed available (had previous CVA and has left sided weakness, foot drop)  Donielle M ZimmermanPA-C 06/16/2021,8:58 AM [phone]   Agree with above Awaiting dispo  Lajuana Matte

## 2021-06-17 ENCOUNTER — Inpatient Hospital Stay (HOSPITAL_COMMUNITY): Payer: Medicare Other

## 2021-06-17 NOTE — Progress Notes (Addendum)
      [address]       [place] [address]             [phone]       4 Days Post-Op Procedure(s) (LRB): XI ROBOTIC ASSISTED THORASCOPY-LEFT UPPER LOBECTOMY (Left) INTERCOSTAL NERVE BLOCK (Left) LYMPH NODE DISSECTION (Left)  Subjective: Patient has no complaints this am.  Objective: Vital signs in last 24 hours: Temp:  [97.5 F (36.4 C)-97.9 F (36.6 C)] 97.7 F (36.5 C) (09/25 0721) Pulse Rate:  [66-78] 74 (09/25 0303) Cardiac Rhythm: Normal sinus rhythm (09/24 2107) Resp:  [16-20] 20 (09/25 0721) BP: (135-150)/(59-78) 135/78 (09/25 0721) SpO2:  [93 %-99 %] 97 % (09/25 0303)      Intake/Output from previous day: 09/24 0701 - 09/25 0700 In: 240 [P.O.:240] Out: -    Physical Exam:  Cardiovascular: RRR, Pulmonary: Clear to auscultation bilaterally; Abdomen: Soft, non tender, bowel sounds present. Extremities: No lower extremity edema. Wounds: Clean and dry.  No erythema or signs of infection. Ecchymosis under left breast and back   Lab Results: CBC: Recent Labs    06/15/21 0040  WBC 9.9  HGB 11.4*  HCT 33.2*  PLT 182    BMET:  Recent Labs    06/15/21 0040  NA 135  K 4.3  CL 102  CO2 29  GLUCOSE 114*  BUN 12  CREATININE 0.57  CALCIUM 8.3*     PT/INR: No results for input(s): LABPROT, INR in the last 72 hours. ABG:  INR: Will add last result for INR, ABG once components are confirmed Will add last 4 CBG results once components are confirmed  Assessment/Plan:  1. CV - SR with HR in the 70's. 2.  Pulmonary - On room air. Encourage incentive spirometer. CXR this am appears stable (small, stable left pneumothorax) . Await final pathology. 3. Expected post op blood loss anemia-Last H and H stable at 11.4 and 33.2. 4. To CIR when bed available (had previous CVA and has left sided weakness, foot drop)  Donielle M ZimmermanPA-C 06/17/2021,9:02 AM   No events Awaiting floor bed  Dhruvan Gullion O Destin Kittler

## 2021-06-18 NOTE — Progress Notes (Signed)
Inpatient Rehab Admissions Coordinator:   I do not have a bed on CIR for this Pt. Today. I will follow for potential admit pending insurance auth and bed availability.  Clemens Catholic, [place], [place] Admissions Coordinator  [phone] ([place]) [phone] (office)

## 2021-06-18 NOTE — Plan of Care (Signed)

## 2021-06-18 NOTE — Progress Notes (Addendum)
      [address]       [place] [address]             [phone]       5 Days Post-Op Procedure(s) (LRB): XI ROBOTIC ASSISTED THORASCOPY-LEFT UPPER LOBECTOMY (Left) INTERCOSTAL NERVE BLOCK (Left) LYMPH NODE DISSECTION (Left)  Subjective: Feels good, no pain, no shortness of breath.  Still having some residual weakness in her left side that has been present since her craniotomy but she thinks has worsened some since the lung resection.  Tolerating PO's, having appropriate bowel and bladder function.   Objective: Vital signs in last 24 hours: Temp:  [97.5 F (36.4 C)-98.4 F (36.9 C)] 97.5 F (36.4 C) (09/26 0335) Pulse Rate:  [60-75] 61 (09/26 0335) Cardiac Rhythm: Normal sinus rhythm (09/25 1945) Resp:  [15-23] 15 (09/26 0335) BP: (124-158)/(70-84) 124/70 (09/26 0335) SpO2:  [95 %-97 %] 97 % (09/26 0335)      Intake/Output from previous day: 09/25 0701 - 09/26 0700 In: 840 [P.O.:840] Out: -    Physical Exam:  Cardiovascular: RRR, Pulmonary:Breath sounds are clear,  Abdomen: Soft, non tender, bowel sounds present. Sat 97% on RA.  Extremities: No lower extremity edema. Wounds: Clean and dry.  No erythema or signs of infection. Expected bruising under left breast and and back   CLINICAL DATA:  Follow-up pneumothorax.   EXAM: CHEST - 2 VIEW   COMPARISON:  06/16/2021   FINDINGS: Stable cardiac enlargement. Postop change from partial left upper lobectomy noted. 7 mm left lateral pneumothorax is identified. This appears similar in volume when compared with the previous exam. No focal pulmonary opacities.   IMPRESSION: 1. No change in the appearance of left lateral pneumothorax. 2. Stable cardiac enlargement.     Electronically Signed   By: Kerby Moors M.D.   On: 06/17/2021 08:44  Assessment/Plan:  1. CV - SR with HR in the 70's. 2.  Pulmonary - Stable O2 sat on room air. Encourage incentive spirometer. Await final pathology. 3.  Expected post op blood loss anemia-Last H and H stable at 11.4 and 33.2. 4. To CIR when bed available (had previous CVA and has left sided weakness, foot drop)  Joline Maxcy [phone] 06/18/2021,7:25 AM  Patient seen and examined, agree with above Waiting for CIR bed  Remo Lipps C. Roxan Hockey, MD [facility] [phone]

## 2021-06-18 NOTE — Progress Notes (Signed)
Physical Therapy Treatment Patient Details Name: Tara Murphy MRN: [id number] DOB: [birth year] Today's Date: 06/18/2021   History of Present Illness 75 y.o. female was admitted on 9/21 for surgery for LUL mass, received lobectomy.  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  Pt had R parietal craniotomy after having seizure-like activity (L sided jerking movements).  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 Improved ambulation distance today with Min A for balance/safety. Continues to demonstrate left foot drop as well as med/lat ankle instability. Noted to have 2/4 DOE with activity but VSS. Worked on sit to stand transitions with emphasis on equal WB through BLEs and anterior translation as pt requiring Mod A at times to rise from chair. Highly motivated to return to independence. Might benefit from a ankle cast to help support ankle stability. Will follow.    Recommendations for follow up therapy are one component of a multi-disciplinary discharge planning process, led by the attending physician.  Recommendations may be updated based on patient status, additional functional criteria and insurance authorization.  Follow Up Recommendations  CIR     Equipment Recommendations  None recommended by PT    Recommendations for Other Services       Precautions / Restrictions Precautions Precautions: Fall;Other (comment) Precaution Comments: L UL lobectomy with support protective pillow, left foot drop Restrictions Weight Bearing Restrictions: No     Mobility  Bed Mobility               General bed mobility comments: OOB in recliner upon entry    Transfers Overall transfer level:  Needs assistance Equipment used: Rolling walker (2 wheeled) Transfers: Sit to/from Stand Sit to Stand: Min assist;Mod assist         General transfer comment: Min-Mod A to stand from recliner with cues for equal WB through BLEs upon standing/sitting and slow descent for eccentric control. Stood from chair x6.  Ambulation/Gait Ambulation/Gait assistance: Min assist Gait Distance (Feet): 75 Feet Assistive device: Rolling walker (2 wheeled) Gait Pattern/deviations: Step-through pattern;Decreased stance time - left;Decreased dorsiflexion - left;Decreased weight shift to left Gait velocity: reduced   General Gait Details: Cues for heel strike and increased stance time on LLE; noted to have med/lat ankle instability but no rolling noted. 2/4 DOE. VSS on RA.   Stairs             Wheelchair Mobility    Modified Rankin (Stroke Patients Only)       Balance Overall balance assessment: Needs assistance Sitting-balance support: Feet supported;No upper extremity supported Sitting balance-Leahy Scale: Fair     Standing balance support: During functional activity Standing balance-Leahy Scale: Fair Standing balance comment: relies on BUE support dynaimcally, min guard to min assist without UE support                            Cognition Arousal/Alertness: Awake/alert Behavior During Therapy: WFL for tasks assessed/performed Overall Cognitive Status: Within Functional Limits for tasks assessed  Exercises      General Comments        Pertinent Vitals/Pain Pain Assessment: No/denies pain    Home Living                      Prior Function            PT Goals (current goals can now be found in the care plan section) Progress towards PT goals: Progressing toward goals    Frequency    Min 3X/week      PT Plan Current plan remains appropriate    Co-evaluation              AM-PAC  PT "6 Clicks" Mobility   Outcome Measure  Help needed turning from your back to your side while in a flat bed without using bedrails?: A Little Help needed moving from lying on your back to sitting on the side of a flat bed without using bedrails?: A Lot Help needed moving to and from a bed to a chair (including a wheelchair)?: A Lot Help needed standing up from a chair using your arms (e.g., wheelchair or bedside chair)?: A Little Help needed to walk in hospital room?: A Little Help needed climbing 3-5 steps with a railing? : A Lot 6 Click Score: 15    End of Session Equipment Utilized During Treatment: Gait belt Activity Tolerance: Patient tolerated treatment well Patient left: in chair;with call bell/phone within reach Nurse Communication: Mobility status PT Visit Diagnosis: Unsteadiness on feet (R26.81);Muscle weakness (generalized) (M62.81);Repeated falls (R29.6)     Time: 8413-2440 PT Time Calculation (min) (ACUTE ONLY): 28 min  Charges:  $Gait Training: 8-22 mins $Therapeutic Activity: 8-22 mins                     Marisa Severin, PT, DPT Acute Rehabilitation Services Pager [phone] Office [place] 06/18/2021, 9:59 AM

## 2021-06-18 NOTE — Progress Notes (Signed)
Occupational Therapy Treatment Patient Details Name: Tara Murphy MRN: [id number] DOB: 04/07/1946 Today's Date: 06/18/2021   History of present illness 75 y.o. female was admitted on 9/21 for surgery for LUL mass, received lobectomy.  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  Pt had R parietal craniotomy after having seizure-like activity (L sided jerking movements).  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 Collaborated with pt on LB ADLs with Min A needed due to B LE swelling and difficulty managing tennis shoes. Pt able to mobilize in room using RW at min guard though fearful of falling and extra attention needed to ensure L foot clearing ground well. Educated on energy conservation strategies and fall prevention with handouts provided. Pt endorses fatigue by end of session and would benefit from further endurance training during ADLs/IADLs. DC recs remain appropriate.    Recommendations for follow up therapy are one component of a multi-disciplinary discharge planning process, led by the attending physician.  Recommendations may be updated based on patient status, additional functional criteria and insurance authorization.    Follow Up Recommendations  CIR;Supervision - Intermittent    Equipment Recommendations  None recommended by OT    Recommendations for Other Services      Precautions / Restrictions Precautions Precautions: Fall;Other (comment) Precaution Comments: L UL lobectomy with support protective pillow, left foot drop Restrictions Weight Bearing Restrictions: No       Mobility Bed Mobility               General bed mobility comments:  OOB in recliner upon entry    Transfers Overall transfer level: Needs assistance Equipment used: Rolling walker (2 wheeled) Transfers: Sit to/from Stand Sit to Stand: Min guard         General transfer comment: min guard for safety, pt pushing from recliner. Actively attempting to apply PT education from earlier this AM for equal WB through BLE    Balance Overall balance assessment: Needs assistance Sitting-balance support: Feet supported;No upper extremity supported Sitting balance-Leahy Scale: Good     Standing balance support: During functional activity Standing balance-Leahy Scale: Fair Standing balance comment: relies on BUE support dynaimcally, able to statically stand for brief time without support                           ADL either performed or assessed with clinical judgement   ADL Overall ADL's : Needs assistance/impaired                     Lower Body Dressing: Minimal assistance;Sit to/from stand Lower Body Dressing Details (indicate cue type and reason): Min A for attempts to don tennis shoes though feet too swollen at this time. Able to use figure four position to don/doff socks easily. Assessed standing balance with pt able to manage clothing around waist without support but if standing prolonged, pt begins to sway and feels unsteady Toilet Transfer: Min guard;Ambulation;RW             General ADL Comments: Discussed strategies for LB ADLs, IADL mgmt with pt reporting she has a walker bag that she uses. Pt does  endorse difficulty getting items from floor or that are out of reach (had a fall this way). Educated on reacher use and ideas to adapt RW to carry reacher with self to use as needed at home. Provided energy conservation strategies education with handout, as well as fall prevention handout     Vision   Vision Assessment?: No apparent visual deficits   Perception     Praxis      Cognition Arousal/Alertness:  Awake/alert Behavior During Therapy: WFL for tasks assessed/performed Overall Cognitive Status: Within Functional Limits for tasks assessed                                          Exercises     Shoulder Instructions       General Comments VSS on RA    Pertinent Vitals/ Pain       Pain Assessment: No/denies pain  Home Living Family/patient expects to be discharged to:: Private residence                                        Prior Functioning/Environment              Frequency  Min 2X/week        Progress Toward Goals  OT Goals(current goals can now be found in the care plan section)  Progress towards OT goals: Progressing toward goals  Acute Rehab OT Goals Patient Stated Goal: to get stronger and get home more confident with walking and self care, "to cook a meal"; be able to go in kitchen to make a cup of tea; go to [employer]; bake cake for grandson (missed their birthday due to admission) OT Goal Formulation: With patient Time For Goal Achievement: 06/30/21 Potential to Achieve Goals: Good ADL Goals Pt Will Perform Grooming: with modified independence;standing Pt Will Perform Lower Body Dressing: with modified independence;sit to/from stand Pt Will Transfer to Toilet: with modified independence;ambulating Pt Will Perform Toileting - Clothing Manipulation and hygiene: with modified independence;sit to/from stand Pt Will Perform Tub/Shower Transfer: Shower transfer;with modified independence;shower seat;ambulating;rolling walker Additional ADL Goal #1: Pt will demosntrate use of 3 energy conservation techniques during daily routine to optimize tolerance and improve safety/fall prevention.  Plan Discharge plan remains appropriate    Co-evaluation                 AM-PAC OT "6 Clicks" Daily Activity     Outcome Measure   Help from another person eating meals?: None Help from another person taking care of personal  grooming?: A Little Help from another person toileting, which includes using toliet, bedpan, or urinal?: A Little Help from another person bathing (including washing, rinsing, drying)?: A Little Help from another person to put on and taking off regular upper body clothing?: None Help from another person to put on and taking off regular lower body clothing?: A Little 6 Click Score: 20    End of Session Equipment Utilized During Treatment: Rolling walker  OT Visit Diagnosis: Other abnormalities of gait and mobility (R26.89);Muscle weakness (generalized) (M62.81);History of falling (Z91.81)   Activity Tolerance Patient tolerated treatment well   Patient Left in bed;with call bell/phone within reach   Nurse Communication Mobility status        Time: 9563-8756 OT Time Calculation (min): 39 min  Charges: OT General Charges $OT Visit: 1 Visit OT Treatments $Self Care/Home Management : 8-22 mins $Therapeutic Activity: 8-22 mins  Malachy Chamber, OTR/L Acute Rehab Services Office: [phone]   Layla Maw 06/18/2021, 1:17 PM

## 2021-06-19 LAB — SURGICAL PATHOLOGY

## 2021-06-19 NOTE — Progress Notes (Addendum)
      [address]       [employer] [address]             [phone]      6 Days Post-Op Procedure(s) (LRB): XI ROBOTIC ASSISTED THORASCOPY-LEFT UPPER LOBECTOMY (Left) INTERCOSTAL NERVE BLOCK (Left) LYMPH NODE DISSECTION (Left) Subjective: Feels well  Objective: Vital signs in last 24 hours: Temp:  [97.8 F (36.6 C)-98.5 F (36.9 C)] 98.5 F (36.9 C) (09/27 0348) Pulse Rate:  [74-98] 74 (09/26 1933) Cardiac Rhythm: Normal sinus rhythm (09/26 2000) Resp:  [19-21] 20 (09/27 0348) BP: (133-160)/(66-78) 133/67 (09/27 0348) SpO2:  [94 %-98 %] 94 % (09/26 1933)  Hemodynamic parameters for last 24 hours:    Intake/Output from previous day: No intake/output data recorded. Intake/Output this shift: No intake/output data recorded.  General appearance: alert, cooperative, and no distress Heart: regular rate and rhythm Lungs: clear to auscultation bilaterally Abdomen: benign Extremities: no edema or calf tenderness Wound: incis healing well, + echymosis  Lab Results: No results for input(s): WBC, HGB, HCT, PLT in the last 72 hours. BMET: No results for input(s): NA, K, CL, CO2, GLUCOSE, BUN, CREATININE, CALCIUM in the last 72 hours.  PT/INR: No results for input(s): LABPROT, INR in the last 72 hours. ABG    Component Value Date/Time   PHART 7.464 (H) 06/11/2021 1120   HCO3 21.5 06/11/2021 1120   TCO2 25 04/30/2021 0936   ACIDBASEDEF 1.7 06/11/2021 1120   O2SAT 98.5 06/11/2021 1120   CBG (last 3)  No results for input(s): GLUCAP in the last 72 hours.  Meds Scheduled Meds:  bisacodyl  10 mg Oral Daily   ciprofloxacin  500 mg Oral BID   dexamethasone  2 mg Oral Q12H   enoxaparin (LOVENOX) injection  40 mg Subcutaneous Daily   gabapentin  600 mg Oral QHS   polyethylene glycol  17 g Oral Daily   rOPINIRole  4 mg Oral QHS   senna-docusate  1 tablet Oral QHS   Continuous Infusions:  dextrose 5 % and 0.9% NaCl Stopped (06/14/21 1306)   PRN  Meds:.albuterol, busPIRone, ondansetron (ZOFRAN) IV, oxyCODONE, traMADol  Xrays No results found.  Assessment/Plan: S/P Procedure(s) (LRB): XI ROBOTIC ASSISTED THORASCOPY-LEFT UPPER LOBECTOMY (Left) INTERCOSTAL NERVE BLOCK (Left) LYMPH NODE DISSECTION (Left)   1 afeb, VSS, s BP 130's-160's- not on anti-hypertensives preop- she says BP has never been an issue prior to starting steroids, knows to keep close f/u with primary 2 sats are good on RA 3 no new labs or CXR's 4 path still pending   LOS: 6 days    John Giovanni PA-C Pager [phone] 06/19/2021   Patient seen and examined, agree with above Hopefully to CIR today  Remo Lipps C. Roxan Hockey, MD [facility] [phone]

## 2021-06-19 NOTE — Plan of Care (Signed)

## 2021-06-19 NOTE — Progress Notes (Signed)
Inpatient Rehab Admissions Coordinator:    I do not have a CIR bed for this Pt today. I will continue to follow for potential admit admit pending bed availability. Pt. And family updated.   Clemens Catholic, [place], [place] Admissions Coordinator  [phone] ([place]) [phone] (office)

## 2021-06-20 ENCOUNTER — Inpatient Hospital Stay (HOSPITAL_COMMUNITY)
Admission: RE | Admit: 2021-06-20 | Discharge: 2021-06-29 | DRG: 092 | Disposition: A | Discharge status: Home / Self Care | Payer: Medicare Other | Source: Intra-hospital | Attending: Physical Medicine and Rehabilitation | Admitting: Physical Medicine and Rehabilitation

## 2021-06-20 ENCOUNTER — Other Ambulatory Visit: Payer: Self-pay

## 2021-06-20 ENCOUNTER — Encounter (HOSPITAL_COMMUNITY): Payer: Self-pay | Admitting: Physical Medicine and Rehabilitation

## 2021-06-20 DIAGNOSIS — R2689 Other abnormalities of gait and mobility: Principal | ICD-10-CM | POA: Diagnosis present

## 2021-06-20 DIAGNOSIS — Z800 Family history of malignant neoplasm of digestive organs: Secondary | ICD-10-CM

## 2021-06-20 DIAGNOSIS — Z8261 Family history of arthritis: Secondary | ICD-10-CM

## 2021-06-20 DIAGNOSIS — G8918 Other acute postprocedural pain: Secondary | ICD-10-CM

## 2021-06-20 DIAGNOSIS — Z888 Allergy status to other drugs, medicaments and biological substances status: Secondary | ICD-10-CM | POA: Diagnosis not present

## 2021-06-20 DIAGNOSIS — R531 Weakness: Secondary | ICD-10-CM | POA: Diagnosis present

## 2021-06-20 DIAGNOSIS — Z902 Acquired absence of lung [part of]: Secondary | ICD-10-CM

## 2021-06-20 DIAGNOSIS — C3490 Malignant neoplasm of unspecified part of unspecified bronchus or lung: Secondary | ICD-10-CM | POA: Diagnosis present

## 2021-06-20 DIAGNOSIS — G2581 Restless legs syndrome: Secondary | ICD-10-CM | POA: Diagnosis present

## 2021-06-20 DIAGNOSIS — G479 Sleep disorder, unspecified: Secondary | ICD-10-CM

## 2021-06-20 DIAGNOSIS — Z91041 Radiographic dye allergy status: Secondary | ICD-10-CM

## 2021-06-20 DIAGNOSIS — Z79899 Other long term (current) drug therapy: Secondary | ICD-10-CM | POA: Diagnosis not present

## 2021-06-20 DIAGNOSIS — G609 Hereditary and idiopathic neuropathy, unspecified: Secondary | ICD-10-CM | POA: Diagnosis present

## 2021-06-20 DIAGNOSIS — Z96643 Presence of artificial hip joint, bilateral: Secondary | ICD-10-CM | POA: Diagnosis present

## 2021-06-20 DIAGNOSIS — Z853 Personal history of malignant neoplasm of breast: Secondary | ICD-10-CM | POA: Diagnosis not present

## 2021-06-20 DIAGNOSIS — Z96652 Presence of left artificial knee joint: Secondary | ICD-10-CM | POA: Diagnosis present

## 2021-06-20 DIAGNOSIS — Z885 Allergy status to narcotic agent status: Secondary | ICD-10-CM | POA: Diagnosis not present

## 2021-06-20 DIAGNOSIS — Z882 Allergy status to sulfonamides status: Secondary | ICD-10-CM

## 2021-06-20 DIAGNOSIS — Z801 Family history of malignant neoplasm of trachea, bronchus and lung: Secondary | ICD-10-CM

## 2021-06-20 DIAGNOSIS — G4700 Insomnia, unspecified: Secondary | ICD-10-CM | POA: Diagnosis present

## 2021-06-20 DIAGNOSIS — Z803 Family history of malignant neoplasm of breast: Secondary | ICD-10-CM

## 2021-06-20 DIAGNOSIS — C7931 Secondary malignant neoplasm of brain: Secondary | ICD-10-CM

## 2021-06-20 DIAGNOSIS — Z87891 Personal history of nicotine dependence: Secondary | ICD-10-CM | POA: Diagnosis not present

## 2021-06-20 DIAGNOSIS — Z7952 Long term (current) use of systemic steroids: Secondary | ICD-10-CM | POA: Diagnosis not present

## 2021-06-20 DIAGNOSIS — Z833 Family history of diabetes mellitus: Secondary | ICD-10-CM | POA: Diagnosis not present

## 2021-06-20 DIAGNOSIS — C3412 Malignant neoplasm of upper lobe, left bronchus or lung: Secondary | ICD-10-CM | POA: Diagnosis present

## 2021-06-20 MED ORDER — CLOBETASOL PROPIONATE 0.05 % EX SOLN: 1.0000 "application " | Freq: Every day | CUTANEOUS | Status: DC | PRN

## 2021-06-20 MED ORDER — BISACODYL 10 MG RE SUPP: 10.0000 mg | Freq: Every day | RECTAL | Status: DC | PRN

## 2021-06-20 MED ORDER — GUAIFENESIN-DM 100-10 MG/5ML PO SYRP
5.0000 mL | ORAL_SOLUTION | Freq: Four times a day (QID) | ORAL | Status: DC | PRN
  Administered 2021-06-28: 10 mL via ORAL
  Filled 2021-06-20: qty 10

## 2021-06-20 MED ORDER — OXYCODONE HCL 5 MG PO TABS
5.0000 mg | ORAL_TABLET | ORAL | Status: DC | PRN
Start: 2021-06-20 — End: 2021-06-29

## 2021-06-20 MED ORDER — SENNOSIDES-DOCUSATE SODIUM 8.6-50 MG PO TABS
1.0000 | ORAL_TABLET | Freq: Every day | ORAL | Status: DC
  Administered 2021-06-20 – 2021-06-23 (×3): 1 via ORAL
  Filled 2021-06-20 (×6): qty 1

## 2021-06-20 MED ORDER — POLYETHYLENE GLYCOL 3350 17 G PO PACK
17.0000 g | PACK | Freq: Every day | ORAL | Status: DC | PRN
Start: 2021-06-20 — End: 2021-06-29

## 2021-06-20 MED ORDER — FLEET ENEMA 7-19 GM/118ML RE ENEM
1.0000 | ENEMA | Freq: Once | RECTAL | Status: DC | PRN
Start: 2021-06-20 — End: 2021-06-29

## 2021-06-20 MED ORDER — ENOXAPARIN SODIUM 40 MG/0.4ML IJ SOSY
40.0000 mg | PREFILLED_SYRINGE | INTRAMUSCULAR | Status: DC
Start: 2021-06-20 — End: 2021-06-20

## 2021-06-20 MED ORDER — DIPHENHYDRAMINE HCL 12.5 MG/5ML PO ELIX: 12.5000 mg | ORAL_SOLUTION | Freq: Four times a day (QID) | ORAL | Status: DC | PRN

## 2021-06-20 MED ORDER — ALUM & MAG HYDROXIDE-SIMETH 200-200-20 MG/5ML PO SUSP: 30.0000 mL | ORAL | Status: DC | PRN

## 2021-06-20 MED ORDER — ALBUTEROL SULFATE (2.5 MG/3ML) 0.083% IN NEBU: 2.5000 mg | INHALATION_SOLUTION | RESPIRATORY_TRACT | Status: DC | PRN

## 2021-06-20 MED ORDER — PROCHLORPERAZINE 25 MG RE SUPP
12.5000 mg | Freq: Four times a day (QID) | RECTAL | Status: DC | PRN
Start: 2021-06-20 — End: 2021-06-29

## 2021-06-20 MED ORDER — BUSPIRONE HCL 5 MG PO TABS: 7.5000 mg | ORAL_TABLET | Freq: Two times a day (BID) | ORAL | Status: DC | PRN

## 2021-06-20 MED ORDER — ROPINIROLE HCL 1 MG PO TABS
4.0000 mg | ORAL_TABLET | Freq: Every day | ORAL | Status: DC
  Administered 2021-06-20 – 2021-06-28 (×9): 4 mg via ORAL
  Filled 2021-06-20 (×9): qty 4

## 2021-06-20 MED ORDER — POLYETHYLENE GLYCOL 3350 17 G PO PACK
17.0000 g | PACK | Freq: Every day | ORAL | Status: DC
  Administered 2021-06-21: 17 g via ORAL
  Filled 2021-06-20 (×5): qty 1

## 2021-06-20 MED ORDER — TRAMADOL HCL 50 MG PO TABS: 50.0000 mg | ORAL_TABLET | Freq: Four times a day (QID) | ORAL | Status: DC | PRN

## 2021-06-20 MED ORDER — ACETAMINOPHEN 325 MG PO TABS
325.0000 mg | ORAL_TABLET | ORAL | Status: DC | PRN
  Administered 2021-06-23: 650 mg via ORAL
  Filled 2021-06-20: qty 2

## 2021-06-20 MED ORDER — ENOXAPARIN SODIUM 40 MG/0.4ML IJ SOSY
40.0000 mg | PREFILLED_SYRINGE | INTRAMUSCULAR | Status: DC
  Administered 2021-06-29: 40 mg via SUBCUTANEOUS
  Filled 2021-06-20 (×4): qty 0.4

## 2021-06-20 MED ORDER — DEXAMETHASONE 2 MG PO TABS
2.0000 mg | ORAL_TABLET | Freq: Two times a day (BID) | ORAL | Status: DC
  Administered 2021-06-20 – 2021-06-22 (×4): 2 mg via ORAL
  Filled 2021-06-20 (×4): qty 1

## 2021-06-20 MED ORDER — PROCHLORPERAZINE EDISYLATE 10 MG/2ML IJ SOLN: 5.0000 mg | Freq: Four times a day (QID) | INTRAMUSCULAR | Status: DC | PRN

## 2021-06-20 MED ORDER — BISACODYL 5 MG PO TBEC
10.0000 mg | DELAYED_RELEASE_TABLET | Freq: Every day | ORAL | Status: DC
  Administered 2021-06-21 – 2021-06-26 (×3): 10 mg via ORAL
  Filled 2021-06-20 (×5): qty 2

## 2021-06-20 MED ORDER — PROCHLORPERAZINE MALEATE 5 MG PO TABS: 5.0000 mg | ORAL_TABLET | Freq: Four times a day (QID) | ORAL | Status: DC | PRN

## 2021-06-20 MED ORDER — TRAZODONE HCL 50 MG PO TABS
25.0000 mg | ORAL_TABLET | Freq: Every evening | ORAL | Status: DC | PRN
  Administered 2021-06-20: 50 mg via ORAL
  Filled 2021-06-20: qty 1

## 2021-06-20 MED ORDER — GABAPENTIN 300 MG PO CAPS
600.0000 mg | ORAL_CAPSULE | Freq: Every day | ORAL | Status: DC
  Administered 2021-06-20 – 2021-06-28 (×9): 600 mg via ORAL
  Filled 2021-06-20 (×9): qty 2

## 2021-06-20 NOTE — Progress Notes (Signed)
PMR Admission Coordinator Pre-Admission Assessment  Patient: Tara Murphy is an 75 y.o., female MRN: [id number] DOB: 07/01/1946 Height: 5' 6" (167.6 cm) Weight: 93.9 kg  Insurance Information HMO:     PPO:      PCP:      IPA:      80/20: ye     OTHER:  PRIMARY: Medicare Part A and B      Policy#: [id number]           Subscriber: Pt. Phone#: Verified online    Fax#:  Pre-Cert#:       Employer:  Benefits:  Phone #:      Name:  Eff. Date: Parts A ad B effective 12/23/2011  Deduct: $1556      Out of Pocket Max:  None      Life Max: N/A  CIR: 100%      SNF: 100 days Outpatient: 80%     Co-Pay: 20% Home Health: 100%      Co-Pay: none DME: 80%     Co-Pay: 20% Providers: patient's choice SECONDARY: [employer]      Policy#: [id number]     Phone#:   Financial Counselor:       Phone#:   The "Data Collection Information Summary" for patients in Inpatient Rehabilitation Facilities with attached "Privacy Act Statement-Health Care Records" was provided and verbally reviewed with: Patient  Emergency Contact Information Contact Information     Name Relation Home Work Mobile   Hintze,Terry Spouse [phone]  [phone]   Sowell,Shannon Daughter [phone]  [phone]       Current Medical History  Patient Admitting Diagnosis: Lobectomy 2/2 LUL mass History of Present Illness: Tara Murphy is a 75-year-old woman with a history of tobacco abuse (quit 2000), stage IV adenocarcinoma of the lung, brain metastasis, left leg weakness, left foot drop, hyperlipidemia, breast cancer, arthritis, total hip replacement, major depression, peripheral neuropathy, irritable bowel syndrome, and osteopenia.She been feeling poorly dating back to February 2022.  In June she developed a left foot drop and peripheral neuropathy.  She presented on 04/24/2021 with a seizure.  A CT showed a hemorrhagic lesion in the right parasagittal frontal parietal tumor measuring 4.6 x 1.6 cm with surrounding vasogenic edema.  CT  of the chest showed a large left upper lobe lung mass.  Dr. Poole did a craniotomy for resection on 04/30/2021.  She went to rehab afterwards.  She then had 3 treatments of stereotactic radiation. PET/CT was recently done which showed hypermetabolic left upper lobe lung mass.  There is a 7 mm lesion in the right upper lobe that is too small to characterize.  There is no evidence of adenopathy or other metastases. On 06/13/21, Pt. Underwent Xi robotic-assisted left upper lobectomy with lymph node dissection. CIR was consulted post-op to assist Pt. In return to PLOF.     Patient[facility] has been reviewed by the rehabilitation admission coordinator and physician.  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HCC)    S/p total hip replacement left   Breast cancer (HCC) 2015   left    Cataract    Cholecystolithiasis    Chronic allergic rhinitis    Depression    Former smoker, stopped smoking in distant past 10/05/2019   Quit 2000; 30 y smoking history   GERD (gastroesophageal reflux disease)    Hepatic steatosis 06/08/2020     Intermittent elevated LFTs and ultrasound 2018   Hyperlipemia    IBS (irritable bowel syndrome) 10/08/2018   Nl colonoscopy and EGD 09/2018   IBS (irritable bowel syndrome)    Lichen plano-pilaris    Major depression, 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 Repeat 2020   Urinary tract infection    hx of frequent     Has the patient had major surgery during 100 days prior to admission? Yes  Family History   family history includes Arthritis in her father and mother;  Breast cancer (age of onset: 72) in her sister; Crohn's disease in her mother; Diabetes in her mother; Hyperlipidemia in her maternal aunt; Lung cancer in her father; Stomach cancer in her maternal grandmother.  Current Medications  Current Facility-Administered Medications:    acetaminophen (TYLENOL) tablet 1,000 mg, 1,000 mg, Oral, Q6H, 1,000 mg at 06/16/21 1321 **OR** acetaminophen (TYLENOL) 160 MG/5ML solution 1,000 mg, 1,000 mg, Oral, Q6H, Gold, Wayne E, PA-C   albuterol (PROVENTIL) (2.5 MG/3ML) 0.083% nebulizer solution 2.5 mg, 2.5 mg, Nebulization, Q4H PRN, Hendrickson, Steven C, MD   bisacodyl (DULCOLAX) EC tablet 10 mg, 10 mg, Oral, Daily, Gold, Wayne E, PA-C, 10 mg at 06/16/21 0943   busPIRone (BUSPAR) tablet 7.5 mg, 7.5 mg, Oral, BID PRN, Gold, Wayne E, PA-C   ciprofloxacin (CIPRO) tablet 500 mg, 500 mg, Oral, BID, Gold, Wayne E, PA-C, 500 mg at 06/16/21 0943   dexamethasone (DECADRON) tablet 2 mg, 2 mg, Oral, Q12H, Gold, Wayne E, PA-C, 2 mg at 06/16/21 0943   dextrose 5 %-0.9 % sodium chloride infusion, , Intravenous, Continuous, Roddenberry, Myron G, PA-C, Stopped at 06/14/21 1306   enoxaparin (LOVENOX) injection 40 mg, 40 mg, Subcutaneous, Daily, Gold, Wayne E, PA-C, 40 mg at 06/16/21 0943   gabapentin (NEURONTIN) capsule 600 mg, 600 mg, Oral, QHS, Gold, Wayne E, PA-C, 600 mg at 06/15/21 2057   ondansetron (ZOFRAN) injection 4 mg, 4 mg, Intravenous, Q6H PRN, Gold, Wayne E, PA-C, 4 mg at 06/13/21 1640   oxyCODONE (Oxy IR/ROXICODONE) immediate release tablet 5-10 mg, 5-10 mg, Oral, Q4H PRN, Gold, Wayne E, PA-C, 10 mg at 06/15/21 0924   polyethylene glycol (MIRALAX / GLYCOLAX) packet 17 g, 17 g, Oral, Daily, Zimmerman, Donielle M, PA-C, 17 g at 06/16/21 0943   rOPINIRole (REQUIP) tablet 4 mg, 4 mg, Oral, QHS, Gold, Wayne E, PA-C, 4 mg at 06/15/21 2056   senna-docusate (Senokot-S) tablet 1 tablet, 1 tablet, Oral, QHS, Gold, Wayne E, PA-C, 1 tablet at 06/15/21 2057   traMADol (ULTRAM)  tablet 50-100 mg, 50-100 mg, Oral, Q6H PRN, Gold, Wayne E, PA-C, 100 mg at 06/14/21 1357  Patients Current Diet:  Diet Order             Diet Heart Room service appropriate? Yes; Fluid consistency: Thin  Diet effective now                   Precautions / Restrictions Precautions Precautions: Fall Precaution Comments: L UL lobectomy with support protective pillow Restrictions Weight Bearing Restrictions: No   Has the patient had 2 or more falls or a fall with injury in the past year? Yes  Prior Activity Level Household: pt. went   out less than once a week  Prior Functional Level Self Care: Did the patient need help bathing, dressing, using the toilet or eating? Needed some help  Indoor Mobility: Did the patient need assistance with walking from room to room (with or without device)? Needed some help  Stairs: Did the patient need assistance with internal or external stairs (with or without device)? Needed some help  Functional Cognition: Did the patient need help planning regular tasks such as shopping or remembering to take medications? Needed some help  Patient Information Are you of Hispanic, Latino/a,or Spanish origin?: A. No, not of Hispanic, Latino/a, or Spanish origin What is your race?: A. White Do you need or want an interpreter to communicate with a doctor or health care staff?: 0. No  Patient's Response To:  Health Literacy and Transportation Is the patient able to respond to health literacy and transportation needs?: Yes Health Literacy - How often do you need to have someone help you when you read instructions, pamphlets, or other written material from your doctor or pharmacy?: Never In the past 12 months, has lack of transportation kept you from medical appointments or from getting medications?: No In the past 12 months, has lack of transportation kept you from meetings, work, or from getting things needed for daily living?: No  Home Assistive Devices /  Equipment Home Equipment: Walker - 4 wheels, Grab bars - toilet, Shower seat - built in, Adaptive equipment  Prior Device Use: Indicate devices/aids used by the patient prior to current illness, exacerbation or injury? Walker  Current Functional Level Cognition  Overall Cognitive Status: Within Functional Limits for tasks assessed Orientation Level: Oriented X4    Extremity Assessment (includes Sensation/Coordination)  Upper Extremity Assessment: Overall WFL for tasks assessed  Lower Extremity Assessment: Defer to PT evaluation LLE Deficits / Details: general weakness and L foot drop LLE Coordination: decreased gross motor    ADLs  Overall ADL's : Needs assistance/impaired Grooming: Min guard, Standing Upper Body Dressing : Set up, Sitting Lower Body Dressing: Minimal assistance, Sit to/from stand Toilet Transfer: Minimal assistance, Ambulation, RW Toilet Transfer Details (indicate cue type and reason): simulated in room Toileting- Clothing Manipulation and Hygiene: Minimal assistance, Sit to/from stand Functional mobility during ADLs: Minimal assistance, Rolling walker    Mobility  Overal bed mobility: Needs Assistance Bed Mobility: Sidelying to Sit, Rolling Rolling: Min assist Sidelying to sit: Min assist Sit to sidelying: Mod assist General bed mobility comments: OOB in recliner upon entry    Transfers  Overall transfer level: Needs assistance Equipment used: Rolling walker (2 wheeled), 1 person hand held assist Transfers: Sit to/from Stand Sit to Stand: Min assist General transfer comment: min assist from recliner to power up and steady    Ambulation / Gait / Stairs / Wheelchair Mobility  Ambulation/Gait Ambulation/Gait assistance: Min assist Gait Distance (Feet): 40 Feet Assistive device: Rolling walker (2 wheeled) Gait Pattern/deviations: Step-through pattern, Decreased stance time - left, Decreased dorsiflexion - left, Decreased weight shift to left General Gait  Details: pt cues to increase L DF and heel strike, VSS on RA with HR to 105 max Gait velocity: reduced Gait velocity interpretation: <1.31 ft/sec, indicative of household ambulator    Posture / Balance Dynamic Sitting Balance Sitting balance - Comments: static sitting without UE support Balance Overall balance assessment: Needs assistance Sitting-balance support: Feet supported Sitting balance-Leahy Scale: Fair Sitting balance - Comments: static sitting without UE support Standing balance support: Bilateral upper extremity supported, No upper extremity supported, During   functional activity Standing balance-Leahy Scale: Fair Standing balance comment: relies on BUE support dynaimcally, min guard to min assist without UE support    Special needs/care consideration Skin surgical incision, Ecchymosis on L chest   Previous Home Environment (from acute therapy documentation) Living Arrangements: Spouse/significant other Available Help at Discharge: Family, Available 24 hours/day Type of Home: House Home Layout: One level Home Access: Stairs to enter Entrance Stairs-Rails: None Entrance Stairs-Number of Steps: 1 Bathroom Shower/Tub: Walk-in shower Bathroom Toilet: Standard Bathroom Accessibility: Yes Additional Comments: has husband but is a back patient and cannot lift her  Discharge Living Setting Plans for Discharge Living Setting: Patient's home Type of Home at Discharge: House Discharge Home Layout: One level Discharge Home Access: Stairs to enter Entrance Stairs-Rails: None Entrance Stairs-Number of Steps: 1 Discharge Bathroom Shower/Tub: Walk-in shower Discharge Bathroom Toilet: Standard Discharge Bathroom Accessibility: Yes How Accessible: Accessible via walker  Social/Family/Support Systems Patient Roles: Spouse Contact Information: [phone] Anticipated Caregiver: Terry Guercio Anticipated Caregiver's Contact Information: [phone] Caregiver Availability:  24/7 Discharge Plan Discussed with Primary Caregiver: Yes Is Caregiver In Agreement with Plan?: Yes Does Caregiver/Family have Issues with Lodging/Transportation while Pt is in Rehab?: No  Goals Patient/Family Goal for Rehab: PT/OT Supervision Expected length of stay: 8-10 days Pt/Family Agrees to Admission and willing to participate: Yes Program Orientation Provided & Reviewed with Pt/Caregiver Including Roles  & Responsibilities: Yes  Decrease burden of Care through IP rehab admission: Specialzed equipment needs, Decrease number of caregivers, Bowel and bladder program, and Patient/family education  Possible need for SNF placement upon discharge: not anticipated  Patient Condition: I have reviewed medical records from [facility], spoken with CM, and patient and spouse. I met with patient at the bedside for inpatient rehabilitation assessment.  Patient will benefit from ongoing PT and OT, can actively participate in 3 hours of therapy a day 5 days of the week, and can make measurable gains during the admission.  Patient will also benefit from the coordinated team approach during an Inpatient Acute Rehabilitation admission.  The patient will receive intensive therapy as well as Rehabilitation physician, nursing, social worker, and care management interventions.  Due to safety, skin/wound care, disease management, medication administration, pain management, and patient education the patient requires 24 hour a day rehabilitation nursing.  The patient is currently min A with mobility and basic ADLs.  Discharge setting and therapy post discharge at home with home health is anticipated.  Patient has agreed to participate in the Acute Inpatient Rehabilitation Program and will admit today.  Preadmission Screen Completed By:  Rachael Zapanta B Chancey Cullinane, 06/16/2021 3:31 PM ______________________________________________________________________   Discussed status with Dr. Patel on 06/20/21  At 930 and  received approval for admission today.  Admission Coordinator:  Jermaine Tholl B Akaylah Lalley, CCC-SLP, time 1100 /Date 06/20/21   Assessment/Plan: Diagnosis: Lobectomy 2/2 LUL mass Does the need for close, 24 hr/day Medical supervision in concert with the patient's rehab needs make it unreasonable for this patient to be served in a less intensive setting? Yes Co-Morbidities requiring supervision/potential complications: tobacco abuse (quit 2000), stage IV adenocarcinoma of the lung, brain metastasis, left leg weakness, left foot drop, hyperlipidemia, breast cancer, arthritis, total hip replacement, major depression, peripheral neuropathy, irritable bowel syndrome, and osteopenia Due to safety, skin/wound care, disease management, and patient education, does the patient require 24 hr/day rehab nursing? Yes Does the patient require coordinated care of a physician, rehab nurse, PT, OT to address physical and functional deficits in the context of the above   medical diagnosis(es)? Yes Addressing deficits in the following areas: balance, endurance, locomotion, strength, transferring, bathing, dressing, toileting, and psychosocial support Can the patient actively participate in an intensive therapy program of at least 3 hrs of therapy 5 days a week? Yes The potential for patient to make measurable gains while on inpatient rehab is good Anticipated functional outcomes upon discharge from inpatient rehab: modified independent and supervision PT, modified independent and supervision OT, n/a SLP Estimated rehab length of stay to reach the above functional goals is: 6-9 days. Anticipated discharge destination: Home 10. Overall Rehab/Functional Prognosis: good   MD Signature: Ankit Patel, MD, ABPMR 

## 2021-06-20 NOTE — Progress Notes (Signed)
Inpatient Rehab Admissions Coordinator:   I have a CIR bed for this Pt. Today. RN may call report to [phone] after 12pm.   Clemens Catholic, [place], [place] Admissions Coordinator  [phone] (celll) [phone] (office)

## 2021-06-20 NOTE — Plan of Care (Signed)

## 2021-06-20 NOTE — TOC Transition Note (Signed)
Transition of Care [facility]) - CM/SW Discharge Note   Patient Details  Name: Tara Murphy MRN: [id number] Date of Birth: 08-29-1946  [facility] CM/SW Contact:  Zenon Mayo, RN Phone Number: 06/20/2021, 11:46 AM   Clinical Narrative:    Patient for dc to CIR today.    Final next level of care: IP Rehab Facility Barriers to Discharge: No Barriers Identified   Patient Goals and CMS Choice Patient states their goals for this hospitalization and ongoing recovery are:: IP rehab      Discharge Placement                       Discharge Plan and Services                          HH Arranged: NA          Social Determinants of Health (SDOH) Interventions     Readmission Risk Interventions No flowsheet data found.

## 2021-06-20 NOTE — H&P (Signed)
Physical Medicine and Rehabilitation Admission H&P    CC: Functional deficits  HPI:  Tara Murphy is a 75 year old female with history of IBS, left breast cancer, idiopathic neuropathy, who started developing left sided weakness with sensory deficits followed by seizure episode on 04/24/21 due to large fronto-parietal tumor that was excised by Dr. Annette Stable and treated with stereotactic XRT.  History taken from chart review and patient. She underwent CIR with outpatient tx for residual left sided weakness/balance deficits. She was found to have LUL mass on recent PET scan concerning of Stage IV adenocarcinoma and was admitted on 06/13/21 for robotic LUL lobectomy with lymph node dissection with intercostal block level 3-10 by Dr. Roxan Hockey. Post op on cipro due to concerns of UTI. CT tube removed with follow up CXR showing small stable left latera PTX.  Respiratory status has been stable. She has had increase in left sided weakness affecting ADLs and mobility. CIR recommended due to functional decline. Please see preadmission assessment from earlier today as well.    Review of Systems  Constitutional:  Negative for fever.  HENT:  Negative for hearing loss.   Eyes:  Negative for blurred vision and double vision.  Respiratory:  Negative for cough and shortness of breath.   Cardiovascular:  Negative for chest pain, palpitations and leg swelling.  Gastrointestinal:  Negative for heartburn and nausea.  Musculoskeletal:  Negative for myalgias.       Left knee instability.  Left chest wall tenderness  Skin:  Negative for rash.  Neurological:  Positive for dizziness (dysequilibrium ?) and sensory change (left chest/upper abdomen numbness).  Psychiatric/Behavioral:  The patient has insomnia. The patient is not nervous/anxious.   All other systems reviewed and are negative.   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[place])    S/p total hip  replacement left   Breast cancer ([place]) 2015   left    Cataract    Cholecystolithiasis    Chronic allergic rhinitis    Depression    Former smoker, stopped smoking in distant past 10/05/2019   Quit 2000; 35 y smoking history   GERD (gastroesophageal reflux disease)    Hepatic steatosis 06/08/2020   Intermittent elevated LFTs and ultrasound 2018   Hyperlipemia    IBS (irritable bowel syndrome) 10/08/2018   Nl colonoscopy and EGD 09/2018   IBS (irritable bowel syndrome)    Lichen plano-pilaris    Major depression, 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 Repeat 2020   Urinary tract infection    hx of frequent     Past Surgical History:  Procedure Laterality Date   APPENDECTOMY     APPLICATION OF CRANIAL NAVIGATION Right 04/30/2021   Procedure: APPLICATION OF CRANIAL NAVIGATION;  Surgeon: Earnie Larsson, MD;  Location: [facility];  Service: Neurosurgery;  Laterality: Right;   BREAST LUMPECTOMY Left 2015   CATARACT EXTRACTION Bilateral    cataracts     CHOLECYSTECTOMY N/A 10/29/2016   Procedure: LAPAROSCOPIC CHOLECYSTECTOMY;  Surgeon: Erroll Luna, MD;  Location: [facility];  Service: General;  Laterality: N/A;   COLONOSCOPY     CRANIOTOMY Right  04/30/2021   Procedure: Right Parietal Craniotomy for tumor with Brain Lab;  Surgeon: Earnie Larsson, MD;  Location: [facility];  Service: Neurosurgery;  Laterality: Right;   EYE SURGERY     INTERCOSTAL NERVE BLOCK Left 06/13/2021   Procedure: INTERCOSTAL NERVE BLOCK;  Surgeon: Melrose Nakayama, MD;  Location: [facility];  Service: Thoracic;  Laterality: Left;   LIPOSUCTION     LYMPH NODE DISSECTION Left 06/13/2021   Procedure: LYMPH NODE DISSECTION;  Surgeon: Melrose Nakayama, MD;  Location: [facility];  Service: Thoracic;  Laterality: Left;   TOTAL HIP ARTHROPLASTY     bilateral hip arthoplasty   TOTAL HIP ARTHROPLASTY Right 12/18/2015   Procedure: RIGHT TOTAL HIP ARTHROPLASTY ANTERIOR APPROACH;  Surgeon: Gaynelle Arabian, MD;  Location: [facility];  Service: Orthopedics;  Laterality: Right;   TOTAL KNEE ARTHROPLASTY     TOTAL KNEE ARTHROPLASTY Left 11/25/2016   Procedure: LEFT TOTAL KNEE ARTHROPLASTY;  Surgeon: Gaynelle Arabian, MD;  Location: [facility];  Service: Orthopedics;  Laterality: Left;  with abductor block   TUBAL LIGATION     UPPER GASTROINTESTINAL ENDOSCOPY      Family History  Problem Relation Age of Onset   Arthritis Mother    Diabetes Mother    Crohn's disease Mother    Arthritis Father    Lung cancer Father    Breast cancer Sister 9   Hyperlipidemia Maternal Aunt    Stomach cancer Maternal Grandmother    Colitis Neg Hx    Rectal cancer Neg Hx    Esophageal cancer Neg Hx     Social History:  Married.. Retired Technical brewer. Was independent with walker PTA. She  reports that she quit smoking about 29 years ago. Her smoking use included cigarettes. She smoked an average of 1 pack per day. She has never used smokeless tobacco. She reports current alcohol use of about 2 glasses of wine per day.  She reports that she does not use drugs.   Allergies  Allergen Reactions   Fluorescein Itching and Other (See Comments)   Contrast Media [Iodinated Diagnostic Agents] Hives   Hydromorphone Hcl Nausea Only   Other Rash    Certain Band-Aids don't agree with the patient's skin; hospital ID wristband causes rashes   Sulfamethoxazole-Trimethoprim Hives   Fentanyl Other (See Comments)    Redness and flushing over body    Tape Rash    Certain Band-Aids don't agree with the patient's skin; hospital ID wristband causes rashes   Terbinafine And Related Hives and Rash    Medications Prior to Admission  Medication Sig Dispense Refill   busPIRone  (BUSPAR) 7.5 MG tablet Take 1 tablet (7.5 mg total) by mouth 2 (two) times daily as needed (anxiety). 30 tablet 0   dexamethasone (DECADRON) 2 MG tablet Take 1 tablet (2 mg total) by mouth every 12 (twelve) hours. 30 tablet 1   gabapentin (NEURONTIN) 300 MG capsule TAKE TWO CAPSULES BY MOUTH TWICE A DAY (Patient taking differently: Take 600 mg by mouth at bedtime.) 360 capsule 3   rOPINIRole (REQUIP) 4 MG tablet Take 1 tablet (4 mg total) by mouth at bedtime. 30 tablet 0   [DISCONTINUED] ciprofloxacin (CIPRO) 500 MG tablet Take 1 tablet (500 mg total) by mouth 2 (two) times daily for 3 doses. 3 tablet 0   clobetasol (TEMOVATE) 0.05 % external solution Apply 1 application topically daily as needed (irritation).     fluticasone (FLONASE) 50 MCG/ACT nasal spray Place 1 spray into both nostrils  daily as needed for allergies or rhinitis.      Drug Regimen Review  Drug regimen was reviewed and remains appropriate with no significant issues identified  Home: Home Living Family/patient expects to be discharged to:: Private residence Living Arrangements: Spouse/significant other Available Help at Discharge: Family, Available 24 hours/day Type of Home: House Home Access: Stairs to enter Technical brewer of Steps: 1 Entrance Stairs-Rails: None Home Layout: One level Bathroom Shower/Tub: Multimedia programmer: Standard Bathroom Accessibility: Yes Home Equipment: Environmental consultant - 4 wheels, Grab bars - toilet, Shower seat - built in, Financial controller: Reacher Additional Comments: has husband but is a back patient and cannot lift her   Functional History: Prior Function Level of Independence: Needs assistance Gait / Transfers Assistance Needed: used rollator for mobility ADL's / Homemaking Assistance Needed: some assist with ADLs (LB)  Functional Status:  Mobility: Bed Mobility Overal bed mobility: Needs Assistance Bed Mobility: Supine to Sit Rolling: Min  assist Sidelying to sit: Min assist Supine to sit: Supervision, HOB elevated Sit to sidelying: Mod assist General bed mobility comments: Pt used B UE to guide L LE to EOB. Encouraged pt to use these strategies with balance of contracting muscles of L LE to improve strength and ability to assist with task Transfers Overall transfer level: Needs assistance Equipment used: Rolling walker (2 wheeled) Transfers: Sit to/from Stand Sit to Stand: Min guard General transfer comment: min guard from elevated bed height, Min A from regular toilet and Mod A from low bed height Ambulation/Gait Ambulation/Gait assistance: Min assist Gait Distance (Feet): 75 Feet Assistive device: Rolling walker (2 wheeled) Gait Pattern/deviations: Step-through pattern, Decreased stance time - left, Decreased dorsiflexion - left, Decreased weight shift to left General Gait Details: Cues for heel strike and increased stance time on LLE; noted to have med/lat ankle instability but no rolling noted. 2/4 DOE. VSS on RA. Gait velocity: reduced Gait velocity interpretation: <1.31 ft/sec, indicative of household ambulator    ADL: ADL Overall ADL's : Needs assistance/impaired Grooming: Supervision/safety, Standing, Wash/dry hands Upper Body Dressing : Sitting, Minimal assistance Upper Body Dressing Details (indicate cue type and reason): able to don shirt without issues, but assist needed for front clasp bra to secure appropriately - likely due to mild sensation/coordination deficits of L UE Lower Body Dressing: Sit to/from stand, Sitting/lateral leans, Supervision/safety Lower Body Dressing Details (indicate cue type and reason): to don pajama pants Toilet Transfer: Minimal assistance, Ambulation, RW, Regular Toilet, Grab bars Toilet Transfer Details (indicate cue type and reason): Able to ambulate with min guard using RW, but required MIn A to stand from regular toilet with use of grab bar. cues for problem solving optimal  hand placement and use of momentum Toileting- Water quality scientist and Hygiene: Supervision/safety, Sit to/from stand, Sitting/lateral lean Toileting - Clothing Manipulation Details (indicate cue type and reason): able to perform hygiene with lateral leans, manage clothing in standing with close supervision Functional mobility during ADLs: Min guard, Rolling walker General ADL Comments: Session focused on ADL AM routine, as well as ability to stand from lower surfaces and surfaces without armrests. Pt requires Min A from regular toilet with grab bars and Mod A from lower bed height. Emphasized practice of lifting hips/pushing from surfaces to increase strength; to maximize confidence for standing from all surfaces (dining room chairs, Sports coach, etc)  Cognition: Cognition Overall Cognitive Status: Within Functional Limits for tasks assessed Orientation Level: Oriented X4 Cognition Arousal/Alertness: Awake/alert Behavior During Therapy: WFL for tasks assessed/performed Overall  Cognitive Status: Within Functional Limits for tasks assessed General Comments: very pleasant and motivated   Blood pressure (!) 146/63, pulse 65, temperature 97.7 F (36.5 C), temperature source Oral, resp. rate 19, height 5\' 6"  (1.676 m), weight 93.9 kg, SpO2 96 %. Physical Exam Vitals reviewed.  Constitutional:      General: She is not in acute distress.    Appearance: Normal appearance. She is obese.  HENT:     Head: Normocephalic and atraumatic.     Right Ear: External ear normal.     Left Ear: External ear normal.     Nose: Nose normal.  Eyes:     General:        Right eye: No discharge.        Left eye: No discharge.     Extraocular Movements: Extraocular movements intact.  Cardiovascular:     Rate and Rhythm: Normal rate and regular rhythm.  Pulmonary:     Effort: Pulmonary effort is normal. No respiratory distress.     Breath sounds: No stridor.  Abdominal:     General: Abdomen is flat.  Bowel sounds are normal. There is no distension.  Musculoskeletal:     Cervical back: Normal range of motion and neck supple.     Comments: LE edema No tenderness in extremities Left chest wall tenderness  Skin:    General: Skin is warm and dry.     Comments: Incision under left breast with skin glue. Prior CT tube site with suture in place. Diffuse ecchymosis from left upper back to flank to lower abdomen. Multiple areas of ecchymosis on abdomen.   Neurological:     Mental Status: She is alert and oriented to person, place, and time.     Comments: Alert Motor: B/l UE 5/5 proximal to distal RLE: 5/5 proximal to distal LLE: HF 2-/5, KE 2-5, ADF 0/5  Psychiatric:        Mood and Affect: Mood normal.        Behavior: Behavior normal.    No results found for this or any previous visit (from the past 48 hour(s)). No results found.     Medical Problem List and Plan: 1.  Left sided weakness affecting ADLs and mobility secondary to lobectomy.   -patient may not shower  -ELOS/Goals: Supervision/Mod I 6-9 days.  Admit to [facility] 2.  Antithrombotics: -DVT/anticoagulation:  Pharmaceutical: Lovenox  -antiplatelet therapy:  3. Pain Management: Gabapentin 600 mg/hs  --d/c Tramadol due to history of seizure episode.   Monitor with increased exertion 4. Mood: LCSW to follow for evaluation and support.   -antipsychotic agents: N/A 5. Neuropsych: This patient is capable of making decisions on her own behalf. 6. Skin/Wound Care: Monitor incision for healing.  7. Fluids/Electrolytes/Nutrition: Monitor I/Os CMP ordered for tomorrow AM 8. Metastatic cancer: Continue decadron 2 mg bid 9. 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  10. RLS: Continue Requip hs  Bary Leriche, PA-C 06/20/2021   I have personally performed a face to face diagnostic evaluation, including, but not limited to relevant history and physical exam findings, of this patient and developed relevant  assessment and plan.  Additionally, I have reviewed and concur with the physician assistant's documentation above.  Delice Lesch, MD, ABPMR  The patient's status has not changed. Any changes from the pre-admission screening or documentation from the acute chart are noted above.   Delice Lesch, MD, ABPMR

## 2021-06-20 NOTE — H&P (Signed)
Physical Medicine and Rehabilitation Admission H&P    CC: Functional deficits  HPI:  Tara Murphy is a 75 year old female with history of IBS, left breast cancer, idiopathic neuropathy, who started developing left sided weakness with sensory deficits followed by seizure episode on 04/24/21 due to large fronto-parietal tumor that was excised by Dr. Annette Stable and treated with stereotactic XRT.  History taken from chart review and patient. She underwent CIR with outpatient tx for residual left sided weakness/balance deficits. She was found to have LUL mass on recent PET scan concerning of Stage IV adenocarcinoma and was admitted on 06/13/21 for robotic LUL lobectomy with lymph node dissection with intercostal block level 3-10 by Dr. Roxan Hockey. Post op on cipro due to concerns of UTI. CT tube removed with follow up CXR showing small stable left latera PTX.  Respiratory status has been stable. She has had increase in left sided weakness affecting ADLs and mobility. CIR recommended due to functional decline. Please see preadmission assessment from earlier today as well.    Review of Systems  Constitutional:  Negative for fever.  HENT:  Negative for hearing loss.   Eyes:  Negative for blurred vision and double vision.  Respiratory:  Negative for cough and shortness of breath.   Cardiovascular:  Negative for chest pain, palpitations and leg swelling.  Gastrointestinal:  Negative for heartburn and nausea.  Musculoskeletal:  Negative for myalgias.       Left knee instability.  Left chest wall tenderness  Skin:  Negative for rash.  Neurological:  Positive for dizziness (dysequilibrium ?) and sensory change (left chest/upper abdomen numbness).  Psychiatric/Behavioral:  The patient has insomnia. The patient is not nervous/anxious.   All other systems reviewed and are negative.   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[place])    S/p total hip  replacement left   Breast cancer ([place]) 2015   left    Cataract    Cholecystolithiasis    Chronic allergic rhinitis    Depression    Former smoker, stopped smoking in distant past 10/05/2019   Quit 2000; 7 y smoking history   GERD (gastroesophageal reflux disease)    Hepatic steatosis 06/08/2020   Intermittent elevated LFTs and ultrasound 2018   Hyperlipemia    IBS (irritable bowel syndrome) 10/08/2018   Nl colonoscopy and EGD 09/2018   IBS (irritable bowel syndrome)    Lichen plano-pilaris    Major depression, 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 Repeat 2020   Urinary tract infection    hx of frequent     Past Surgical History:  Procedure Laterality Date   APPENDECTOMY     APPLICATION OF CRANIAL NAVIGATION Right 04/30/2021   Procedure: APPLICATION OF CRANIAL NAVIGATION;  Surgeon: Earnie Larsson, MD;  Location: [facility];  Service: Neurosurgery;  Laterality: Right;   BREAST LUMPECTOMY Left 2015   CATARACT EXTRACTION Bilateral    cataracts     CHOLECYSTECTOMY N/A 10/29/2016   Procedure: LAPAROSCOPIC CHOLECYSTECTOMY;  Surgeon: Erroll Luna, MD;  Location: [facility];  Service: General;  Laterality: N/A;   COLONOSCOPY     CRANIOTOMY Right  04/30/2021   Procedure: Right Parietal Craniotomy for tumor with Brain Lab;  Surgeon: Earnie Larsson, MD;  Location: [facility];  Service: Neurosurgery;  Laterality: Right;   EYE SURGERY     INTERCOSTAL NERVE BLOCK Left 06/13/2021   Procedure: INTERCOSTAL NERVE BLOCK;  Surgeon: Melrose Nakayama, MD;  Location: [facility];  Service: Thoracic;  Laterality: Left;   LIPOSUCTION     LYMPH NODE DISSECTION Left 06/13/2021   Procedure: LYMPH NODE DISSECTION;  Surgeon: Melrose Nakayama, MD;  Location: [facility];  Service: Thoracic;  Laterality: Left;   TOTAL HIP ARTHROPLASTY     bilateral hip arthoplasty   TOTAL HIP ARTHROPLASTY Right 12/18/2015   Procedure: RIGHT TOTAL HIP ARTHROPLASTY ANTERIOR APPROACH;  Surgeon: Gaynelle Arabian, MD;  Location: [facility];  Service: Orthopedics;  Laterality: Right;   TOTAL KNEE ARTHROPLASTY     TOTAL KNEE ARTHROPLASTY Left 11/25/2016   Procedure: LEFT TOTAL KNEE ARTHROPLASTY;  Surgeon: Gaynelle Arabian, MD;  Location: [facility];  Service: Orthopedics;  Laterality: Left;  with abductor block   TUBAL LIGATION     UPPER GASTROINTESTINAL ENDOSCOPY      Family History  Problem Relation Age of Onset   Arthritis Mother    Diabetes Mother    Crohn's disease Mother    Arthritis Father    Lung cancer Father    Breast cancer Sister 1   Hyperlipidemia Maternal Aunt    Stomach cancer Maternal Grandmother    Colitis Neg Hx    Rectal cancer Neg Hx    Esophageal cancer Neg Hx     Social History:  Married.. Retired Technical brewer. Was independent with walker PTA. She  reports that she quit smoking about 29 years ago. Her smoking use included cigarettes. She smoked an average of 1 pack per day. She has never used smokeless tobacco. She reports current alcohol use of about 2 glasses of wine per day.  She reports that she does not use drugs.   Allergies  Allergen Reactions   Fluorescein Itching and Other (See Comments)   Contrast Media [Iodinated Diagnostic Agents] Hives   Hydromorphone Hcl Nausea Only   Other Rash    Certain Band-Aids don't agree with the patient's skin; hospital ID wristband causes rashes   Sulfamethoxazole-Trimethoprim Hives   Fentanyl Other (See Comments)    Redness and flushing over body    Tape Rash    Certain Band-Aids don't agree with the patient's skin; hospital ID wristband causes rashes   Terbinafine And Related Hives and Rash    Medications Prior to Admission  Medication Sig Dispense Refill   busPIRone  (BUSPAR) 7.5 MG tablet Take 1 tablet (7.5 mg total) by mouth 2 (two) times daily as needed (anxiety). 30 tablet 0   dexamethasone (DECADRON) 2 MG tablet Take 1 tablet (2 mg total) by mouth every 12 (twelve) hours. 30 tablet 1   gabapentin (NEURONTIN) 300 MG capsule TAKE TWO CAPSULES BY MOUTH TWICE A DAY (Patient taking differently: Take 600 mg by mouth at bedtime.) 360 capsule 3   rOPINIRole (REQUIP) 4 MG tablet Take 1 tablet (4 mg total) by mouth at bedtime. 30 tablet 0   [DISCONTINUED] ciprofloxacin (CIPRO) 500 MG tablet Take 1 tablet (500 mg total) by mouth 2 (two) times daily for 3 doses. 3 tablet 0   clobetasol (TEMOVATE) 0.05 % external solution Apply 1 application topically daily as needed (irritation).     fluticasone (FLONASE) 50 MCG/ACT nasal spray Place 1 spray into both nostrils  daily as needed for allergies or rhinitis.      Drug Regimen Review  Drug regimen was reviewed and remains appropriate with no significant issues identified  Home: Home Living Family/patient expects to be discharged to:: Private residence Living Arrangements: Spouse/significant other Available Help at Discharge: Family, Available 24 hours/day Type of Home: House Home Access: Stairs to enter Technical brewer of Steps: 1 Entrance Stairs-Rails: None Home Layout: One level Bathroom Shower/Tub: Multimedia programmer: Standard Bathroom Accessibility: Yes Home Equipment: Environmental consultant - 4 wheels, Grab bars - toilet, Shower seat - built in, Financial controller: Reacher Additional Comments: has husband but is a back patient and cannot lift her   Functional History: Prior Function Level of Independence: Needs assistance Gait / Transfers Assistance Needed: used rollator for mobility ADL's / Homemaking Assistance Needed: some assist with ADLs (LB)  Functional Status:  Mobility: Bed Mobility Overal bed mobility: Needs Assistance Bed Mobility: Supine to Sit Rolling: Min  assist Sidelying to sit: Min assist Supine to sit: Supervision, HOB elevated Sit to sidelying: Mod assist General bed mobility comments: Pt used B UE to guide L LE to EOB. Encouraged pt to use these strategies with balance of contracting muscles of L LE to improve strength and ability to assist with task Transfers Overall transfer level: Needs assistance Equipment used: Rolling walker (2 wheeled) Transfers: Sit to/from Stand Sit to Stand: Min guard General transfer comment: min guard from elevated bed height, Min A from regular toilet and Mod A from low bed height Ambulation/Gait Ambulation/Gait assistance: Min assist Gait Distance (Feet): 75 Feet Assistive device: Rolling walker (2 wheeled) Gait Pattern/deviations: Step-through pattern, Decreased stance time - left, Decreased dorsiflexion - left, Decreased weight shift to left General Gait Details: Cues for heel strike and increased stance time on LLE; noted to have med/lat ankle instability but no rolling noted. 2/4 DOE. VSS on RA. Gait velocity: reduced Gait velocity interpretation: <1.31 ft/sec, indicative of household ambulator    ADL: ADL Overall ADL's : Needs assistance/impaired Grooming: Supervision/safety, Standing, Wash/dry hands Upper Body Dressing : Sitting, Minimal assistance Upper Body Dressing Details (indicate cue type and reason): able to don shirt without issues, but assist needed for front clasp bra to secure appropriately - likely due to mild sensation/coordination deficits of L UE Lower Body Dressing: Sit to/from stand, Sitting/lateral leans, Supervision/safety Lower Body Dressing Details (indicate cue type and reason): to don pajama pants Toilet Transfer: Minimal assistance, Ambulation, RW, Regular Toilet, Grab bars Toilet Transfer Details (indicate cue type and reason): Able to ambulate with min guard using RW, but required MIn A to stand from regular toilet with use of grab bar. cues for problem solving optimal  hand placement and use of momentum Toileting- Water quality scientist and Hygiene: Supervision/safety, Sit to/from stand, Sitting/lateral lean Toileting - Clothing Manipulation Details (indicate cue type and reason): able to perform hygiene with lateral leans, manage clothing in standing with close supervision Functional mobility during ADLs: Min guard, Rolling walker General ADL Comments: Session focused on ADL AM routine, as well as ability to stand from lower surfaces and surfaces without armrests. Pt requires Min A from regular toilet with grab bars and Mod A from lower bed height. Emphasized practice of lifting hips/pushing from surfaces to increase strength; to maximize confidence for standing from all surfaces (dining room chairs, Sports coach, etc)  Cognition: Cognition Overall Cognitive Status: Within Functional Limits for tasks assessed Orientation Level: Oriented X4 Cognition Arousal/Alertness: Awake/alert Behavior During Therapy: WFL for tasks assessed/performed Overall  Cognitive Status: Within Functional Limits for tasks assessed General Comments: very pleasant and motivated   Blood pressure (!) 146/63, pulse 65, temperature 97.7 F (36.5 C), temperature source Oral, resp. rate 19, height 5\' 6"  (1.676 m), weight 93.9 kg, SpO2 96 %. Physical Exam Vitals reviewed.  Constitutional:      General: She is not in acute distress.    Appearance: Normal appearance. She is obese.  HENT:     Head: Normocephalic and atraumatic.     Right Ear: External ear normal.     Left Ear: External ear normal.     Nose: Nose normal.  Eyes:     General:        Right eye: No discharge.        Left eye: No discharge.     Extraocular Movements: Extraocular movements intact.  Cardiovascular:     Rate and Rhythm: Normal rate and regular rhythm.  Pulmonary:     Effort: Pulmonary effort is normal. No respiratory distress.     Breath sounds: No stridor.  Abdominal:     General: Abdomen is flat.  Bowel sounds are normal. There is no distension.  Musculoskeletal:     Cervical back: Normal range of motion and neck supple.     Comments: LE edema No tenderness in extremities Left chest wall tenderness  Skin:    General: Skin is warm and dry.     Comments: Incision under left breast with skin glue. Prior CT tube site with suture in place. Diffuse ecchymosis from left upper back to flank to lower abdomen. Multiple areas of ecchymosis on abdomen.   Neurological:     Mental Status: She is alert and oriented to person, place, and time.     Comments: Alert Motor: B/l UE 5/5 proximal to distal RLE: 5/5 proximal to distal LLE: HF 2-/5, KE 2-5, ADF 0/5  Psychiatric:        Mood and Affect: Mood normal.        Behavior: Behavior normal.    No results found for this or any previous visit (from the past 48 hour(s)). No results found.     Medical Problem List and Plan: 1.  Left sided weakness affecting ADLs and mobility secondary to lobectomy.   -patient may not shower  -ELOS/Goals: Supervision/Mod I 6-9 days.  Admit to [facility] 2.  Antithrombotics: -DVT/anticoagulation:  Pharmaceutical: Lovenox  -antiplatelet therapy:  3. Pain Management: Gabapentin 600 mg/hs  --d/c Tramadol due to history of seizure episode.   Monitor with increased exertion 4. Mood: LCSW to follow for evaluation and support.   -antipsychotic agents: N/A 5. Neuropsych: This patient is capable of making decisions on her own behalf. 6. Skin/Wound Care: Monitor incision for healing.  7. Fluids/Electrolytes/Nutrition: Monitor I/Os CMP ordered for tomorrow AM 8. Metastatic cancer: Continue decadron 2 mg bid 9. 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  10. RLS: Continue Requip hs  Bary Leriche, PA-C 06/20/2021   I have personally performed a face to face diagnostic evaluation, including, but not limited to relevant history and physical exam findings, of this patient and developed relevant  assessment and plan.  Additionally, I have reviewed and concur with the physician assistant's documentation above.  Delice Lesch, MD, ABPMR

## 2021-06-20 NOTE — Progress Notes (Signed)
      [address]       [place] [address]             [phone]      7 Days Post-Op Procedure(s) (LRB): XI ROBOTIC ASSISTED THORASCOPY-LEFT UPPER LOBECTOMY (Left) INTERCOSTAL NERVE BLOCK (Left) LYMPH NODE DISSECTION (Left) Subjective: Doing well, no new concerns.    Objective: Vital signs in last 24 hours: Temp:  [97.9 F (36.6 C)-98.6 F (37 C)] 98.2 F (36.8 C) (09/28 0323) Pulse Rate:  [65-92] 65 (09/28 0323) Cardiac Rhythm: Normal sinus rhythm (09/27 2000) Resp:  [15-21] 15 (09/28 0323) BP: (124-159)/(57-90) 132/67 (09/28 0323) SpO2:  [94 %-96 %] 96 % (09/28 0323)  Hemodynamic parameters for last 24 hours:    Intake/Output from previous day: No intake/output data recorded. Intake/Output this shift: No intake/output data recorded.  General appearance: alert, cooperative, and no distress Heart: regular rate and rhythm Lungs: breath sounds clear Wound: incisions dry and intact, expected echymosis  Lab Results: No results for input(s): WBC, HGB, HCT, PLT in the last 72 hours. BMET: No results for input(s): NA, K, CL, CO2, GLUCOSE, BUN, CREATININE, CALCIUM in the last 72 hours.  PT/INR: No results for input(s): LABPROT, INR in the last 72 hours. ABG    Component Value Date/Time   PHART 7.464 (H) 06/11/2021 1120   HCO3 21.5 06/11/2021 1120   TCO2 25 04/30/2021 0936   ACIDBASEDEF 1.7 06/11/2021 1120   O2SAT 98.5 06/11/2021 1120   CBG (last 3)  No results for input(s): GLUCAP in the last 72 hours.  Meds Scheduled Meds:  bisacodyl  10 mg Oral Daily   dexamethasone  2 mg Oral Q12H   enoxaparin (LOVENOX) injection  40 mg Subcutaneous Daily   gabapentin  600 mg Oral QHS   polyethylene glycol  17 g Oral Daily   rOPINIRole  4 mg Oral QHS   senna-docusate  1 tablet Oral QHS   Continuous Infusions:  dextrose 5 % and 0.9% NaCl Stopped (06/14/21 1306)   PRN Meds:.albuterol, busPIRone, ondansetron (ZOFRAN) IV, oxyCODONE, traMADol  Xrays No  results found.  Assessment/Plan: S/P Procedure(s) (LRB): XI ROBOTIC ASSISTED THORASCOPY-LEFT UPPER LOBECTOMY (Left) INTERCOSTAL NERVE BLOCK (Left) LYMPH NODE DISSECTION (Left)  POD-7 robotic-assisted left upper lobectomy for NSSLC. Stable respiratory status and progressing with mobility.  Still having some residual left-sided weakness since craniotomy for resection of a metastatic brain lesion. Planning for further rehab in [place] when bed available.    PATH:  - Poorly differentiated non-small cell lung carcinoma, 3.2 cm.  - Margins not involved.  - No pleural involvement identified.  - All lymph nodes negative for metastatic carcinoma   LOS: 7 days    Antony Odea PA-C Pager [phone] 06/20/2021

## 2021-06-20 NOTE — Progress Notes (Signed)
OT Cancellation Note  Patient Details Name: Tara Murphy MRN: [id number] DOB: 1946/07/20   Cancelled Treatment:    Reason Eval/Treat Not Completed: Other (comment) Pt currently eating breakfast. Will follow-up for OT session as schedule permits.  Layla Maw 06/20/2021, 7:13 AM

## 2021-06-20 NOTE — Progress Notes (Signed)
Inpatient Rehabilitation Medication Review by a Pharmacist  A complete drug regimen review was completed for this patient to identify any potential clinically significant medication issues.  High Risk Drug Classes Is patient taking? Indication by Medication  Antipsychotic Yes Anxiety prn  Anticoagulant Yes VTE ppx  Antibiotic No   Opioid Yes pain  Antiplatelet No   Hypoglycemics/insulin No   Vasoactive Medication No   Chemotherapy No   Other No      Type of Medication Issue Identified Description of Issue Recommendation(s)  Drug Interaction(s) (clinically significant)     Duplicate Therapy     Allergy     No Medication Administration End Date     Incorrect Dose     Additional Drug Therapy Needed     Significant med changes from prior encounter (inform family/care partners about these prior to discharge).    Other       Clinically significant medication issues were identified that warrant physician communication and completion of prescribed/recommended actions by midnight of the next day:  No   Time spent performing this drug regimen review (minutes):  15    Thank you for allowing [name] to participate in this patients care. Jens Som, PharmD 06/20/2021 8:59 PM  **Pharmacist phone directory can be found on [email or web address] listed under [place]**

## 2021-06-20 NOTE — Progress Notes (Signed)
Occupational Therapy Treatment Patient Details Name: Tara Murphy MRN: [id number] DOB: 02-Jun-1946 Today's Date: 06/20/2021   History of present illness 75 y.o. female was admitted on 9/21 for surgery for LUL mass, received lobectomy.  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  Pt had R parietal craniotomy after having seizure-like activity (L sided jerking movements).  PMHx:  brain tumor resection 04/30/2021, L breast cancer status postlumpectomy in 2015, left foot drop, PN, HLD, GERD, chronic anxiety/depression, herpes zoster, former smoker, RLS, IBS   OT comments  Pt progressing well towards OT goals and remains eager to participate with therapies. Session focused on AM ADL routine with pt able to mobilize to/from bathroom, complete toileting task and LB dressing tasks with min guard at most. Session also focused on sit to stand transfer practice from lower surfaces and surfaces w/o armrests as pt requires Min to Mod A for this at times due to decreased strength. Pt reports avoidance of using certain chairs (dining room, difficulty with toilet despite riser) due to fear of being unable to stand from these surfaces. Encouraged continued transfer simulation practice and Arts development officer awareness outside of therapy sessions. Continue to recommend CIR for intensive therapies to further address ADLs, IADLs and mobility.    Recommendations for follow up therapy are one component of a multi-disciplinary discharge planning process, led by the attending physician.  Recommendations may be updated based on patient status, additional functional criteria and insurance authorization.    Follow Up Recommendations  CIR;Supervision - Intermittent    Equipment Recommendations  None recommended by OT    Recommendations for Other Services      Precautions / Restrictions Precautions Precautions: Fall;Other  (comment) Precaution Comments: L UL lobectomy with support protective pillow, left foot drop Restrictions Weight Bearing Restrictions: No       Mobility Bed Mobility Overal bed mobility: Needs Assistance Bed Mobility: Supine to Sit     Supine to sit: Supervision;HOB elevated     General bed mobility comments: Pt used B UE to guide L LE to EOB. Encouraged pt to use these strategies with balance of contracting muscles of L LE to improve strength and ability to assist with task    Transfers Overall transfer level: Needs assistance Equipment used: Rolling walker (2 wheeled) Transfers: Sit to/from Stand           General transfer comment: min guard from elevated bed height, Min A from regular toilet and Mod A from low bed height    Balance Overall balance assessment: Needs assistance Sitting-balance support: Feet supported;No upper extremity supported Sitting balance-Leahy Scale: Good     Standing balance support: During functional activity Standing balance-Leahy Scale: Fair Standing balance comment: relies on BUE support dynaimcally, able to statically stand for brief time without support                           ADL either performed or assessed with clinical judgement   ADL Overall ADL's : Needs assistance/impaired     Grooming: Supervision/safety;Standing;Wash/dry hands           Upper Body Dressing : Sitting;Minimal assistance Upper Body Dressing Details (indicate cue type and reason): able to don shirt without issues, but assist needed for front clasp bra to secure appropriately - likely due to mild sensation/coordination deficits of L UE Lower Body Dressing: Sit to/from  stand;Sitting/lateral leans;Supervision/safety Lower Body Dressing Details (indicate cue type and reason): to don pajama pants Toilet Transfer: Minimal assistance;Ambulation;RW;Regular Toilet;Grab bars Toilet Transfer Details (indicate cue type and reason): Able to ambulate with  min guard using RW, but required MIn A to stand from regular toilet with use of grab bar. cues for problem solving optimal hand placement and use of momentum Toileting- Clothing Manipulation and Hygiene: Supervision/safety;Sit to/from stand;Sitting/lateral lean Toileting - Clothing Manipulation Details (indicate cue type and reason): able to perform hygiene with lateral leans, manage clothing in standing with close supervision     Functional mobility during ADLs: Min guard;Rolling walker General ADL Comments: Session focused on ADL AM routine, as well as ability to stand from lower surfaces and surfaces without armrests. Pt requires Min A from regular toilet with grab bars and Mod A from lower bed height. Emphasized practice of lifting hips/pushing from surfaces to increase strength; to maximize confidence for standing from all surfaces (dining room chairs, restaurant chairs, etc)     Vision   Vision Assessment?: No apparent visual deficits Additional Comments: does endorse new dry eyes and need for eye adjustment with initial sit to stands   Perception     Praxis      Cognition Arousal/Alertness: Awake/alert Behavior During Therapy: WFL for tasks assessed/performed Overall Cognitive Status: Within Functional Limits for tasks assessed                                 General Comments: very pleasant and motivated        Exercises     Shoulder Instructions       General Comments Daughter present and supportive    Pertinent Vitals/ Pain       Pain Assessment: No/denies pain  Home Living                                          Prior Functioning/Environment              Frequency  Min 2X/week        Progress Toward Goals  OT Goals(current goals can now be found in the care plan section)  Progress towards OT goals: Progressing toward goals  Acute Rehab OT Goals Patient Stated Goal: to get stronger and get home more confident with  walking and self care, "to cook a meal"; be able to go in kitchen to make a cup of tea; go to [employer]; bake cake for grandson (missed their birthday due to admission) OT Goal Formulation: With patient Time For Goal Achievement: 06/30/21 Potential to Achieve Goals: Good ADL Goals Pt Will Perform Grooming: with modified independence;standing Pt Will Perform Lower Body Dressing: with modified independence;sit to/from stand Pt Will Transfer to Toilet: with modified independence;ambulating Pt Will Perform Toileting - Clothing Manipulation and hygiene: with modified independence;sit to/from stand Pt Will Perform Tub/Shower Transfer: Shower transfer;with modified independence;shower seat;ambulating;rolling walker Additional ADL Goal #1: Pt will demosntrate use of 3 energy conservation techniques during daily routine to optimize tolerance and improve safety/fall prevention.  Plan Discharge plan remains appropriate    Co-evaluation                 AM-PAC OT "6 Clicks" Daily Activity     Outcome Measure   Help from another person eating meals?: None Help from another person  taking care of personal grooming?: A Little Help from another person toileting, which includes using toliet, bedpan, or urinal?: A Little Help from another person bathing (including washing, rinsing, drying)?: A Little Help from another person to put on and taking off regular upper body clothing?: None Help from another person to put on and taking off regular lower body clothing?: A Little 6 Click Score: 20    End of Session Equipment Utilized During Treatment: Rolling walker  OT Visit Diagnosis: Other abnormalities of gait and mobility (R26.89);Muscle weakness (generalized) (M62.81);History of falling (Z91.81)   Activity Tolerance Patient tolerated treatment well   Patient Left in bed;with call bell/phone within reach;with family/visitor present   Nurse Communication Mobility status        Time:  1517-6160 OT Time Calculation (min): 30 min  Charges: OT Treatments $Self Care/Home Management : 23-37 mins  Malachy Chamber, OTR/L Acute Rehab Services Office: [phone]   Layla Maw 06/20/2021, 8:52 AM

## 2021-06-21 DIAGNOSIS — Z902 Acquired absence of lung [part of]: Secondary | ICD-10-CM

## 2021-06-21 LAB — CBC WITH DIFFERENTIAL/PLATELET
Abs Immature Granulocytes: 0.53 10*3/uL — ABNORMAL HIGH (ref 0.00–0.07)
Basophils Absolute: 0 10*3/uL (ref 0.0–0.1)
Basophils Relative: 0 %
Eosinophils Absolute: 0 10*3/uL (ref 0.0–0.5)
Eosinophils Relative: 0 %
HCT: 33.6 % — ABNORMAL LOW (ref 36.0–46.0)
Hemoglobin: 11.2 g/dL — ABNORMAL LOW (ref 12.0–15.0)
Immature Granulocytes: 6 %
Lymphocytes Relative: 12 %
Lymphs Abs: 1.1 10*3/uL (ref 0.7–4.0)
MCH: 30.6 pg (ref 26.0–34.0)
MCHC: 33.3 g/dL (ref 30.0–36.0)
MCV: 91.8 fL (ref 80.0–100.0)
Monocytes Absolute: 0.5 10*3/uL (ref 0.1–1.0)
Monocytes Relative: 5 %
Neutro Abs: 7.1 10*3/uL (ref 1.7–7.7)
Neutrophils Relative %: 77 %
Platelets: 245 10*3/uL (ref 150–400)
RBC: 3.66 MIL/uL — ABNORMAL LOW (ref 3.87–5.11)
RDW: 14.8 % (ref 11.5–15.5)
WBC: 9.2 10*3/uL (ref 4.0–10.5)
nRBC: 0 % (ref 0.0–0.2)

## 2021-06-21 LAB — COMPREHENSIVE METABOLIC PANEL
ALT: 55 U/L — ABNORMAL HIGH (ref 0–44)
AST: 23 U/L (ref 15–41)
Albumin: 2.9 g/dL — ABNORMAL LOW (ref 3.5–5.0)
Alkaline Phosphatase: 69 U/L (ref 38–126)
Anion gap: 9 (ref 5–15)
BUN: 14 mg/dL (ref 8–23)
CO2: 25 mmol/L (ref 22–32)
Calcium: 8.5 mg/dL — ABNORMAL LOW (ref 8.9–10.3)
Chloride: 103 mmol/L (ref 98–111)
Creatinine, Ser: 0.5 mg/dL (ref 0.44–1.00)
GFR, Estimated: >60 mL/min (ref >60)
Glucose, Bld: 110 mg/dL — ABNORMAL HIGH (ref 70–99)
Potassium: 3.7 mmol/L (ref 3.5–5.1)
Sodium: 137 mmol/L (ref 135–145)
Total Bilirubin: 0.8 mg/dL (ref 0.3–1.2)
Total Protein: 5.3 g/dL — ABNORMAL LOW (ref 6.5–8.1)

## 2021-06-21 MED ORDER — MELATONIN 3 MG PO TABS
3.0000 mg | ORAL_TABLET | Freq: Every day | ORAL | Status: DC
  Administered 2021-06-21 – 2021-06-28 (×8): 3 mg via ORAL
  Filled 2021-06-21 (×8): qty 1

## 2021-06-21 NOTE — Progress Notes (Signed)
Inpatient Rehabilitation Center Individual Statement of Services  Patient Name:  Tara Murphy  Date:  06/21/2021  Welcome to the [facility].  Our goal is to provide you with an individualized program based on your diagnosis and situation, designed to meet your specific needs.  With this comprehensive rehabilitation program, you will be expected to participate in at least 3 hours of rehabilitation therapies Monday-Friday, with modified therapy programming on the weekends.  Your rehabilitation program will include the following services:  Physical Therapy (PT), Occupational Therapy (OT), 24 hour per day rehabilitation nursing, Therapeutic Recreaction (TR), Care Coordinator, Rehabilitation Medicine, Nutrition Services, and Pharmacy Services  Weekly team conferences will be held on Tuesaday to discuss your progress.  Your Inpatient Rehabilitation Care Coordinator will talk with you frequently to get your input and to update you on team discussions.  Team conferences with you and your family in attendance may also be held.  Expected length of stay: 7-10 Days  Overall anticipated outcome: Supervision with cues  Depending on your progress and recovery, your program may change. Your Inpatient Rehabilitation Care Coordinator will coordinate services and will keep you informed of any changes. Your Inpatient Rehabilitation Care Coordinator's name and contact numbers are listed  below.  The following services may also be recommended but are not provided by the [facility]:   [place] will be made to provide these services after discharge if needed.  Arrangements include referral to agencies that provide these services.  Your insurance has been verified to be:  [place] Your primary doctor is:  Billey Chang  Pertinent information will be shared with your doctor and your  insurance company.  Inpatient Rehabilitation Care Coordinator:  Ovidio Kin, [place] or Emilia Beck  Information discussed with and copy given to patient by: Elease Hashimoto, 06/21/2021, 10:35 AM

## 2021-06-21 NOTE — Evaluation (Signed)
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 [facility])   Past Medical History:  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[place])    S/p total hip replacement left   Breast cancer ([place]) 2015   left    Cataract    Cholecystolithiasis    Chronic allergic rhinitis    Depression    Former smoker, stopped smoking in distant past 10/05/2019   Quit 2000; 10 y smoking history   GERD (gastroesophageal reflux disease)    Hepatic steatosis 06/08/2020   Intermittent elevated LFTs and ultrasound 2018   Hyperlipemia    IBS (irritable bowel syndrome) 10/08/2018   Nl colonoscopy and EGD 09/2018   IBS (irritable bowel syndrome)    Lichen plano-pilaris    Major depression, 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 Repeat 2020   Urinary tract infection    hx of frequent    Past Surgical History:  Past Surgical History:  Procedure Laterality Date   APPENDECTOMY     APPLICATION OF CRANIAL NAVIGATION Right 04/30/2021   Procedure: APPLICATION OF CRANIAL NAVIGATION;  Surgeon: Earnie Larsson, MD;  Location: [place];  Service: Neurosurgery;  Laterality: Right;   BREAST LUMPECTOMY Left 2015   CATARACT EXTRACTION Bilateral    cataracts     CHOLECYSTECTOMY N/A  10/29/2016   Procedure: LAPAROSCOPIC CHOLECYSTECTOMY;  Surgeon: Erroll Luna, MD;  Location: [place];  Service: General;  Laterality: N/A;   COLONOSCOPY     CRANIOTOMY Right 04/30/2021   Procedure: Right Parietal Craniotomy for tumor with Brain Lab;  Surgeon: Earnie Larsson, MD;  Location: [place];  Service: Neurosurgery;  Laterality: Right;   EYE SURGERY     INTERCOSTAL NERVE BLOCK Left 06/13/2021   Procedure: INTERCOSTAL NERVE BLOCK;  Surgeon: Melrose Nakayama, MD;  Location: [place] [facility];  Service: Thoracic;  Laterality: Left;   LIPOSUCTION     LYMPH NODE DISSECTION Left 06/13/2021   Procedure: LYMPH NODE DISSECTION;  Surgeon: Melrose Nakayama, MD;  Location: [place];  Service: Thoracic;  Laterality: Left;   TOTAL HIP ARTHROPLASTY     bilateral hip arthoplasty   TOTAL HIP ARTHROPLASTY Right 12/18/2015   Procedure: RIGHT TOTAL HIP ARTHROPLASTY ANTERIOR APPROACH;  Surgeon: Gaynelle Arabian, MD;  Location: [facility];  Service: Orthopedics;  Laterality: Right;   TOTAL KNEE ARTHROPLASTY     TOTAL KNEE ARTHROPLASTY Left 11/25/2016   Procedure: LEFT TOTAL KNEE ARTHROPLASTY;  Surgeon: Gaynelle Arabian, MD;  Location: [facility];  Service: Orthopedics;  Laterality: Left;  with abductor block   TUBAL LIGATION     UPPER GASTROINTESTINAL ENDOSCOPY      Assessment & Plan Clinical Impression: Tara Murphy is a 75 year old female with history of IBS, left breast cancer, idiopathic neuropathy,  who started developing left sided weakness with sensory deficits followed by seizure episode on 04/24/21 due to large fronto-parietal tumor that was excised by Dr. Annette Stable and treated with stereotactic XRT.  History taken from chart review and patient. She underwent CIR with outpatient tx for residual left sided weakness/balance deficits. She was found to have LUL mass on recent PET scan concerning of Stage IV adenocarcinoma and was admitted on 06/13/21 for robotic LUL lobectomy with lymph node dissection with intercostal block level 3-10  by Dr. Roxan Hockey. Post op on cipro due to concerns of UTI. CT tube removed with follow up CXR showing small stable left latera PTX.  Respiratory status has been stable. She has had increase in left sided weakness affecting ADLs and mobility. CIR recommended due to functional decline. Please see preadmission assessment from earlier today as well. Patient transferred to CIR on 06/20/2021 .    Patient currently requires min with mobility secondary to muscle weakness, decreased cardiorespiratoy endurance, abnormal tone, unbalanced muscle activation, and decreased coordination, and decreased standing balance and decreased balance strategies.  Prior to hospitalization, patient was modified independent  with mobility and lived with Spouse (husband has some back problems) in a House home.  Home access is 1Stairs to enter (threshold).  Patient will benefit from skilled PT intervention to maximize safe functional mobility, minimize fall risk, and decrease caregiver burden for planned discharge home with 24 hour supervision.  Anticipate patient will benefit from follow up OP at discharge.  PT - End of Session Activity Tolerance: Tolerates 30+ min activity with multiple rests Endurance Deficit: Yes Endurance Deficit Description: Required frequent breaks 2/2 fatigue PT Assessment Rehab Potential (ACUTE/IP ONLY): Good PT Barriers to Discharge: Decreased caregiver support;Lack of/limited family support PT Barriers to Discharge Comments: Pt's husband unable to provide physical assist PT Patient demonstrates impairments in the following area(s): Balance;Safety;Edema;Sensory;Motor;Endurance PT Transfers Functional Problem(s): Bed Mobility;Bed to Chair;Car;Furniture PT Locomotion Functional Problem(s): Ambulation;Stairs PT Plan PT Intensity: Minimum of 1-2 x/day ,45 to 90 minutes PT Frequency: 5 out of 7 days PT Duration Estimated Length of Stay: 7-10 days PT Treatment/Interventions: Ambulation/gait  training;Discharge planning;Functional mobility training;Psychosocial support;Therapeutic Activities;Visual/perceptual remediation/compensation;Balance/vestibular training;Disease management/prevention;Neuromuscular re-education;Skin care/wound management;Therapeutic Exercise;Wheelchair propulsion/positioning;Cognitive remediation/compensation;DME/adaptive equipment instruction;Pain management;Splinting/orthotics;UE/LE Strength taining/ROM;Community reintegration;Patient/family education;Functional electrical stimulation;Stair training;UE/LE Coordination activities PT Transfers Anticipated Outcome(s): mod I PT Locomotion Anticipated Outcome(s): supervision with LRAD PT Recommendation Follow Up Recommendations: Outpatient PT Patient destination: Home Equipment Recommended: Other (comment) Equipment Details: Pt owns equipment from previous stay, do not anticipate more equipment needs   PT Evaluation Precautions/Restrictions Precautions Precautions: Other (comment) (fall risk, mild L hemi (LE>UE)) Precaution Comments: left foot drop Required Braces or Orthoses: Other Brace Other Brace: L AFO, not using since previous admission Restrictions Weight Bearing Restrictions: No General   Vital SignsTherapy Vitals Temp: 98.2 F (36.8 C) Temp Source: Oral Pulse Rate: 91 Resp: 16 BP: (!) 158/64 Patient Position (if appropriate): Sitting Oxygen Therapy SpO2: 98 % Pain Pain Assessment Pain Scale: 0-10 Pain Score: 0-No pain Pain Interference Pain Interference Pain Effect on Sleep: 1. Rarely or not at all Pain Interference with Therapy Activities: 1. Rarely or not at all Pain Interference with Day-to-Day Activities: 1. Rarely or not at all Home Living/Prior [place] Available Help at Discharge: Family;Available 24 hours/day Type of Home: House Home Access: Stairs to enter (threshold) Entrance Stairs-Number of Steps: 1 Entrance Stairs-Rails: None Home Layout: One  level Bathroom Shower/Tub: Multimedia programmer: Standard Bathroom Accessibility: Yes Additional Comments: has husband but is a  back patient and cannot lift her  Lives With: Spouse (husband has some back problems) Prior Function Level of Independence: Requires assistive device for independence  Able to Take Stairs?: Yes (prior to first admission) Driving: Yes Vocation: Retired Vision/Perception  Vision - History Ability to See in Adequate Light: 0 Adequate Perception Perception: Within Functional Limits Praxis Praxis: Intact  Cognition Overall Cognitive Status: Within Functional Limits for tasks assessed Arousal/Alertness: Awake/alert Year: 2022 Month: September Day of Week: Correct Sustained Attention: Appears intact Memory: Appears intact Immediate Memory Recall: Sock;Bed;Blue Memory Recall Sock: Without Cue Memory Recall Blue: Without Cue Memory Recall Bed: Without Cue Awareness: Appears intact Problem Solving: Appears intact Reasoning: Appears intact Sequencing: Appears intact Decision Making: Appears intact Safety/Judgment: Appears intact Sensation Sensation Light Touch: Impaired Detail Peripheral sensation comments: LLE impaired, foot and ankle normal, impaired proximally Light Touch Impaired Details: Impaired LLE Proprioception Impaired Details: Impaired LLE Coordination Gross Motor Movements are Fluid and Coordinated: No Fine Motor Movements are Fluid and Coordinated: No Coordination and Movement Description: mild L hemiparesis, generalized weakness/deconditioning, decreaesd balance/postural control Finger Nose Finger Test: WFL Motor  Motor Motor: Hemiplegia Motor - Skilled Clinical Observations: mild L hemiparesis (LE>UE), generalized weakness/deconditioning, decreaesd balance/postural control   Trunk/Postural Assessment  Cervical Assessment Cervical Assessment: Within Functional Limits Thoracic Assessment Thoracic Assessment: Within  Functional Limits Lumbar Assessment Lumbar Assessment: Within Functional Limits Postural Control Postural Control: Within Functional Limits  Balance Balance Balance Assessed: Yes Static Sitting Balance Static Sitting - Balance Support: Feet supported;Bilateral upper extremity supported Static Sitting - Level of Assistance: 5: Stand by assistance Dynamic Sitting Balance Dynamic Sitting - Balance Support: Feet supported;No upper extremity supported Dynamic Sitting - Level of Assistance: 5: Stand by assistance Static Standing Balance Static Standing - Balance Support: Bilateral upper extremity supported Static Standing - Level of Assistance: 5: Stand by assistance Dynamic Standing Balance Dynamic Standing - Balance Support: During functional activity Dynamic Standing - Level of Assistance: 5: Stand by assistance Extremity Assessment  RUE Assessment RUE Assessment: Within Functional Limits LUE Assessment LUE Assessment: Within Functional Limits Active Range of Motion (AROM) Comments: WFL General Strength Comments: grossly comparable to RUE despite hx of L hemi RLE Assessment RLE Assessment: Exceptions to [facility] LLE Assessment LLE Assessment: Exceptions to [facility] LLE Strength Left Hip Flexion: 3-/5 Left Knee Flexion: 2/5 Left Knee Extension: 2/5 Left Ankle Dorsiflexion: 2/5 Left Ankle Plantar Flexion: 3-/5  Care Tool Care Tool Bed Mobility Roll left and right activity   Roll left and right assist level: Minimal Assistance - Patient > 75%    Sit to lying activity   Sit to lying assist level: Minimal Assistance - Patient > 75%    Lying to sitting on side of bed activity   Lying to sitting on side of bed assist level: the ability to move from lying on the back to sitting on the side of the bed with no back support.: Minimal Assistance - Patient > 75%     Care Tool Transfers Sit to stand transfer   Sit to stand assist level: Contact Guard/Touching assist    Chair/bed transfer    Chair/bed transfer assist level: Contact Guard/Touching assist     Psychologist, counselling transfer activity did not occur: Safety/medical Retail banker Comment: RW    Care Tool Locomotion Ambulation   Assist level: Contact Guard/Touching assist Assistive device: Walker-rolling Max distance: 100 ft  Walk 10 feet activity  Assist level: Contact Guard/Touching assist Assistive device: Walker-rolling   Walk 50 feet with 2 turns activity   Assist level: Contact Guard/Touching assist Assistive device: Walker-rolling  Walk 150 feet activity Walk 150 feet activity did not occur: Safety/medical concerns      Walk 10 feet on uneven surfaces activity Walk 10 feet on uneven surfaces activity did not occur: Safety/medical concerns      Stairs   Assist level: Contact Guard/Touching assist Stairs assistive device: 2 hand rails Max number of stairs: 8 (3")  Walk up/down 1 step activity   Walk up/down 1 step (curb) assist level: Contact Guard/Touching assist Walk up/down 1 step or curb assistive device: 2 hand rails    Walk up/down 4 steps activity Walk up/down 4 steps assist level: Contact Guard/Touching assist Walk up/down 4 steps assistive device: 2 hand rails  Walk up/down 12 steps activity Walk up/down 12 steps activity did not occur: Safety/medical concerns      Pick up small objects from floor Pick up small object from the floor (from standing position) activity did not occur: Safety/medical concerns      Wheelchair Is the patient using a wheelchair?: Yes Type of Wheelchair: Manual (transport only)   Wheelchair assist level: Dependent - Patient 0%    Wheel 50 feet with 2 turns activity   Assist Level: Dependent - Patient 0%  Wheel 150 feet activity   Assist Level: Dependent - Patient 0%    Refer to Care Plan for Long Term Goals  SHORT TERM GOAL WEEK 1 PT Short Term Goal 1 (Week 1): =LTGs d/t ELOS  Recommendations for  other services: None   Skilled Therapeutic Intervention Mobility Bed Mobility Rolling Right: Minimal Assistance - Patient > 75% Rolling Left: Minimal Assistance - Patient > 75% Supine to Sit: Minimal Assistance - Patient > 75% Sit to Supine: Minimal Assistance - Patient > 75% Transfers Transfers: Sit to Stand;Stand to Sit;Stand Pivot Transfers Sit to Stand: Contact Guard/Touching assist Stand to Sit: Contact Guard/Touching assist Stand Pivot Transfers: Contact Guard/Touching assist Stand Pivot Transfer Details: Verbal cues for technique;Verbal cues for safe use of DME/AE;Verbal cues for precautions/safety;Verbal cues for sequencing Transfer (Assistive device): Rolling walker Locomotion  Gait Ambulation: Yes Gait Assistance: Contact Guard/Touching assist Gait Distance (Feet): 100 Feet Assistive device: Rolling walker Gait Assistance Details: Verbal cues for safe use of DME/AE;Verbal cues for precautions/safety;Verbal cues for sequencing Gait Assistance Details: verbal cues for upright posture/gaze Gait Gait: Yes Gait Pattern: Impaired Gait Pattern: Decreased stance time - left;Decreased stride length;Decreased dorsiflexion - left;Decreased weight shift to left;Left circumduction;Left hip hike;Trunk flexed;Poor foot clearance - right;Poor foot clearance - left;Narrow base of support;Decreased trunk rotation;Step-through pattern;Decreased step length - right;Trendelenburg;Lateral hip instability Gait velocity: reduced Stairs / Additional Locomotion Stairs: Yes Stairs Assistance: Contact Guard/Touching assist Stair Management Technique: Two rails Number of Stairs: 8 Height of Stairs: 3 Wheelchair Mobility Wheelchair Mobility: No  Evaluation completed (see details above and below) with education on PT POC and goals and individual treatment initiated with focus on assessing current mobility level and initiating gait and transfer training.   Session 1: Pt received in recliner and  agreeable to therapy.  No complaint of pain. Pt noted to be hyperverbose during subjective eval. Strength and sensation testing performed as noted above. Sit to stand with CGA throughout session. ambulatory transfer to w/c with CGA and RW. Gait training x 100 ft with RW and CGA. Pt demoes decr stance time on L leg and decr step length on R.  Foot drop and mild trendelenberg noted on L side. Mild ankle instability, nearly resolved with shoes. Pt navigated 3" stairs x8 with BUE support and CGA. Step to pattern, alternating lead foot. Pt then returned to room and returned to recliner in same manner. was left with all needs in reach and alarm active.   Session 2: Pt received in recliner and agreeable to therapy.  No complaint of pain. Pt reports that she is exhausted after full day of therapy and would prefer to stay in room. Session focused on improving Sit to stand mechanics. Pt donned ted hose and socks with supervision. 5XSTS =1 min 3 sec.  Pt performed test from recliner with RW for balance in standing with supervision.  Pt asked about bracing for her ankle, stating another therapist mentioned it on acute. Assessed ankle motion with standing. Pt mildly pronates in standing (BIL but L>R), and reported improved feeling of stability with manual stabilization. This therapist suspects problem will resolve with improved L abductor strength and pelvic stability. Pt returned to bed after session with RW and CGA. Sit>supine with min A to manage LLE. Pt was left with all needs in reach and alarm active.    Discharge Criteria: Patient will be discharged from PT if patient refuses treatment 3 consecutive times without medical reason, if treatment goals not met, if there is a change in medical status, if patient makes no progress towards goals or if patient is discharged from hospital.  The above assessment, treatment plan, treatment alternatives and goals were discussed and mutually agreed upon: by patient  Mickel Fuchs 06/21/2021, 4:05 PM

## 2021-06-21 NOTE — Plan of Care (Signed)
  Problem: Consults Goal: RH GENERAL PATIENT EDUCATION Description: See Patient Education module for education specifics. Outcome: Progressing   Problem: RH SAFETY Goal: RH STG ADHERE TO SAFETY PRECAUTIONS W/ASSISTANCE/DEVICE Description: STG Adhere to Safety Precautions With supervision Assistance/Device. Outcome: Progressing

## 2021-06-21 NOTE — Evaluation (Signed)
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 [facility])   Past Medical History:  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[place])    S/p total hip replacement left   Breast cancer ([place]) 2015   left    Cataract    Cholecystolithiasis    Chronic allergic rhinitis    Depression    Former smoker, stopped smoking in distant past 10/05/2019   Quit 2000; 64 y smoking history   GERD (gastroesophageal reflux disease)    Hepatic steatosis 06/08/2020   Intermittent elevated LFTs and ultrasound 2018   Hyperlipemia    IBS (irritable bowel syndrome) 10/08/2018   Nl colonoscopy and EGD 09/2018   IBS (irritable bowel syndrome)    Lichen plano-pilaris    Major depression, 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 Repeat 2020   Urinary tract infection    hx of frequent    Past Surgical History:  Past Surgical History:  Procedure Laterality Date   APPENDECTOMY     APPLICATION OF CRANIAL NAVIGATION Right 04/30/2021   Procedure: APPLICATION OF CRANIAL NAVIGATION;  Surgeon: Earnie Larsson, MD;  Location: [place];  Service: Neurosurgery;  Laterality: Right;   BREAST LUMPECTOMY Left 2015   CATARACT EXTRACTION Bilateral    cataracts      CHOLECYSTECTOMY N/A 10/29/2016   Procedure: LAPAROSCOPIC CHOLECYSTECTOMY;  Surgeon: Erroll Luna, MD;  Location: [place];  Service: General;  Laterality: N/A;   COLONOSCOPY     CRANIOTOMY Right 04/30/2021   Procedure: Right Parietal Craniotomy for tumor with Brain Lab;  Surgeon: Earnie Larsson, MD;  Location: [place];  Service: Neurosurgery;  Laterality: Right;   EYE SURGERY     INTERCOSTAL NERVE BLOCK Left 06/13/2021   Procedure: INTERCOSTAL NERVE BLOCK;  Surgeon: Melrose Nakayama, MD;  Location: [place];  Service: Thoracic;  Laterality: Left;   LIPOSUCTION     LYMPH NODE DISSECTION Left 06/13/2021   Procedure: LYMPH NODE DISSECTION;  Surgeon: Melrose Nakayama, MD;  Location: [place];  Service: Thoracic;  Laterality: Left;   TOTAL HIP ARTHROPLASTY     bilateral hip arthoplasty   TOTAL HIP ARTHROPLASTY Right 12/18/2015   Procedure: RIGHT TOTAL HIP ARTHROPLASTY ANTERIOR APPROACH;  Surgeon: Gaynelle Arabian, MD;  Location: [facility];  Service: Orthopedics;  Laterality: Right;   TOTAL KNEE ARTHROPLASTY     TOTAL KNEE ARTHROPLASTY Left 11/25/2016   Procedure: LEFT TOTAL KNEE ARTHROPLASTY;  Surgeon: Gaynelle Arabian, MD;  Location: [facility];  Service: Orthopedics;  Laterality: Left;  with abductor block   TUBAL LIGATION     UPPER GASTROINTESTINAL ENDOSCOPY      Assessment & Plan Clinical Impression: Tara Murphy is a 75 year old female with history of IBS, left breast cancer,  idiopathic neuropathy, who started developing left sided weakness with sensory deficits followed by seizure episode on 04/24/21 due to large fronto-parietal tumor that was excised by Dr. Annette Stable and treated with stereotactic XRT.  History taken from chart review and patient. She underwent CIR with outpatient tx for residual left sided weakness/balance deficits. She was found to have LUL mass on recent PET scan concerning of Stage IV adenocarcinoma and was admitted on 06/13/21 for robotic LUL lobectomy with lymph node dissection with  intercostal block level 3-10 by Dr. Roxan Hockey. Post op on cipro due to concerns of UTI. CT tube removed with follow up CXR showing small stable left latera PTX.  Respiratory status has been stable. She has had increase in left sided weakness affecting ADLs and mobility. CIR recommended due to functional decline. Please see preadmission assessment from earlier today as well. Patient transferred to CIR on 06/20/2021 .    Patient currently requires  CGA-Min  with basic self-care skills secondary to muscle weakness, decreased cardiorespiratoy endurance, impaired timing and sequencing, decreased coordination, and decreased motor planning, and decreased sitting balance, decreased standing balance, decreased postural control, hemiplegia, and decreased balance strategies.  Prior to hospitalization, patient could complete BADL with modified independent -Supervision  Patient will benefit from skilled intervention to decrease level of assist with basic self-care skills and increase independence with basic self-care skills prior to discharge home with care partner.  Anticipate patient will require intermittent supervision and follow up outpatient.  OT - End of Session Activity Tolerance: Tolerates 30+ min activity with multiple rests Endurance Deficit: Yes Endurance Deficit Description: Required frequent breaks 2/2 fatigue OT Assessment OT Patient demonstrates impairments in the following area(s): Balance;Endurance;Motor;Safety;Perception OT Basic ADL's Functional Problem(s): Grooming;Bathing;Dressing;Toileting OT Transfers Functional Problem(s): Toilet;Tub/Shower OT Additional Impairment(s): None OT Plan OT Intensity: Minimum of 1-2 x/day, 45 to 90 minutes OT Frequency: 5 out of 7 days OT Duration/Estimated Length of Stay: 7-10 days OT Treatment/Interventions: Balance/vestibular training;Disease mangement/prevention;Neuromuscular re-education;Self Care/advanced ADL retraining;Therapeutic Exercise;Wheelchair  propulsion/positioning;UE/LE Strength taining/ROM;Skin care/wound managment;Pain management;DME/adaptive equipment instruction;Cognitive remediation/compensation;Community reintegration;Functional electrical stimulation;Patient/family education;Splinting/orthotics;UE/LE Coordination activities;Therapeutic Activities;Psychosocial support;Functional mobility training;Discharge planning;Visual/perceptual remediation/compensation OT Self Feeding Anticipated Outcome(s): no goal OT Basic Self-Care Anticipated Outcome(s): Supervision OT Toileting Anticipated Outcome(s): Mod I OT Bathroom Transfers Anticipated Outcome(s): Supervision shower, Mod I toilet OT Recommendation Recommendations for Other Services: Therapeutic Recreation consult Therapeutic Recreation Interventions: Pet therapy;Kitchen group;Stress management Patient destination: Home Follow Up Recommendations: Outpatient OT Equipment Recommended: To be determined   OT Evaluation Precautions/Restrictions  Precautions Precautions: Other (comment) (fall risk, mild L hemi (LE>UE)) Precaution Comments: left foot drop Required Braces or Orthoses: Other Brace Other Brace: L AFO, not using since previous admission Restrictions Weight Bearing Restrictions: No Pain Pain Assessment Pain Scale: 0-10 Pain Score: 0-No pain Home Living/Prior Functioning Home Living Family/patient expects to be discharged to:: Private residence Living Arrangements: Spouse/significant other Available Help at Discharge: Family, Available 24 hours/day Type of Home: House Home Access: Stairs to enter (threshold) Technical brewer of Steps: 1 Entrance Stairs-Rails: None Home Layout: One level Bathroom Shower/Tub: Multimedia programmer: Standard Bathroom Accessibility: Yes Additional Comments: has husband but is a back patient and cannot lift her  Lives With: Spouse IADL History Homemaking Responsibilities: Yes Meal Prep Responsibility: Secondary  (per chart) Laundry Responsibility: Secondary Cleaning Responsibility: Secondary Occupation: Retired Type of Occupation: retired from Engineer, 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 Prior Function Level of Independence: Requires assistive device for independence, Independent with homemaking with ambulation, Independent with basic ADLs  Able to Take Stairs?: Yes  Driving: Yes Vocation: Retired Surveyor, mining Baseline Vision/History: 0 No visual deficits Ability to See in Adequate Light: 0 Adequate Patient Visual Report: No change from baseline Vision Assessment?: No apparent visual deficits Perception  Perception: Within Functional Limits Praxis Praxis: Intact Cognition Overall Cognitive Status: Within Functional Limits for tasks assessed Arousal/Alertness: Awake/alert Orientation Level: Person;Place;Situation Person: Oriented Place: Oriented Situation: Oriented Year: 2022 Month: September Day of Week: Correct Memory: Appears intact Immediate Memory Recall: Sock;Bed;Blue Memory Recall Sock: Without Cue Memory Recall Blue: Without Cue Memory Recall Bed: Without Cue Sustained Attention: Appears intact Awareness: Appears intact Problem Solving: Appears intact Reasoning: Appears intact Sequencing: Appears intact Decision Making: Appears intact Safety/Judgment: Appears intact Sensation Sensation Light Touch: Impaired Detail Peripheral sensation comments: LLE impaired, foot and ankle normal, impaired proximally Light Touch Impaired Details: Impaired LLE Proprioception Impaired Details: Impaired LLE Coordination Gross Motor Movements are Fluid and Coordinated: No Fine Motor Movements are Fluid and Coordinated: No Coordination and Movement Description: mild L hemiparesis, generalized weakness/deconditioning, decreaesd balance/postural control Finger Nose Finger Test: WFL Motor  Motor Motor: Hemiplegia Motor - Skilled Clinical Observations: mild L hemiparesis (LE>UE), generalized  weakness/deconditioning, decreaesd balance/postural control  Trunk/Postural Assessment  Cervical Assessment Cervical Assessment: Within Functional Limits Thoracic Assessment Thoracic Assessment: Exceptions to [facility] Lumbar Assessment Lumbar Assessment: Within Functional Limits Postural Control Postural Control: Within Functional Limits  Balance Balance Balance Assessed: Yes Static Sitting Balance Static Sitting - Balance Support: Feet supported;Bilateral upper extremity supported Static Sitting - Level of Assistance: 5: Stand by assistance Dynamic Sitting Balance Dynamic Sitting - Balance Support: Feet supported;No upper extremity supported Dynamic Sitting - Level of Assistance: 5: Stand by assistance Static Standing Balance Static Standing - Balance Support: Bilateral upper extremity supported Static Standing - Level of Assistance: 5: Stand by assistance Dynamic Standing Balance Dynamic Standing - Balance Support: During functional activity Dynamic Standing - Level of Assistance: 5: Stand by assistance Extremity/Trunk Assessment RUE Assessment RUE Assessment: Within Functional Limits LUE Assessment LUE Assessment: Within Functional Limits Active Range of Motion (AROM) Comments: WFL General Strength Comments: grossly comparable to RUE despite hx of L hemi  Care Tool Care Tool Self Care Eating    Set up    Oral Care    Oral Care Assist Level: Set up assist    Bathing      CGA        Upper Body Dressing(including orthotics)   What is the patient wearing?: Bra;Pull over shirt   Assist Level: Supervision/Verbal cueing    Lower Body Dressing (excluding footwear)   What is the patient wearing?: Pants Assist for lower body dressing: Contact Guard/Touching assist    Putting on/Taking off footwear   What is the patient wearing?: Non-skid slipper socks Assist for footwear: Supervision/Verbal cueing       Care Tool Toileting Toileting activity   Assist for toileting:  Contact Guard/Touching assist     Care Tool Bed Mobility Roll left and right activity    Supervision    Sit to lying activity    Supervision    Lying to sitting on side of bed activity    Supervision     Care Tool Transfers Sit to stand transfer   Sit to stand assist level: Contact Guard/Touching assist    Chair/bed transfer   Chair/bed transfer assist level: Contact Guard/Touching assist     Toilet transfer   Assist Level: Contact Guard/Touching assist     Care Tool Cognition  Expression of Ideas and Wants Expression of Ideas and Wants:  3. Some difficulty - exhibits some difficulty with expressing needs and ideas (e.g, some words or finishing thoughts) or speech is not clear  Understanding Verbal and Non-Verbal Content Understanding Verbal and Non-Verbal Content: 4. Understands (complex and basic) - clear comprehension without cues or repetitions   Memory/Recall Ability Memory/Recall Ability : Current season;Staff names and faces;That he or she is in a hospital/hospital unit   Refer to Care Plan for [place] 1 OT Short Term Goal 1 (Week 1): STGs = LTGs at Mod I/Supervision  Recommendations for other services: Therapeutic Recreation  Pet therapy, Kitchen group, and Stress management   Skilled Therapeutic Intervention ADL ADL Eating: Not assessed Grooming: Setup;Supervision/safety Upper Body Bathing: Setup Lower Body Bathing: Contact guard Upper Body Dressing: Setup Lower Body Dressing: Contact guard Toileting: Contact guard Toilet Transfer: Therapist, music Method: Ambulating Mobility  Transfers Sit to Stand: Contact Guard/Touching assist Stand to Sit: Contact Guard/Touching assist   Skilled Intervention: Pt greeted at time of session sitting EOB ready for OT session and evaluation. No pain reported. See above and below for details. Discussion regarding role of OT and CIR structure.   Simulated bathing tasks for ROM  and functional reaching as planned to do shower later on this afternoon (messaged MD to clear for showering during session). UB/LB dressing with CGA overall for underwear, pants, socks, bra, and shirt with pt remembering hemitechniques and figure four from previous CIR stay. Pt performing functional mobility throughout room to gather clothing items and to from recliner <> bathroom CGA with RW and cues for LLE foot clearance. Education throughout regarding energy conservation and bathroom adaptations. Pt up in recliner alarm on call bell in reach.     Discharge Criteria: Patient will be discharged from OT if patient refuses treatment 3 consecutive times without medical reason, if treatment goals not met, if there is a change in medical status, if patient makes no progress towards goals or if patient is discharged from hospital.  The above assessment, treatment plan, treatment alternatives and goals were discussed and mutually agreed upon: by patient  Viona Gilmore 06/21/2021, 12:27 PM

## 2021-06-21 NOTE — Progress Notes (Signed)
Occupational Therapy Session Note  Patient Details  Name: Tara Murphy MRN: [id number] Date of Birth: 10/26/1945  Today's Date: 06/21/2021 OT Individual Time: 1301-1355 OT Individual Time Calculation (min): 54 min    Short Term Goals: Week 1:  OT Short Term Goal 1 (Week 1): STGs = LTGs at Mod I/Supervision  Skilled Therapeutic Interventions/Progress Updates:  Pt greeted at time of session sitting up in recliner with daughter present who remained throughout session. No pain reported. Pt ambulated recliner <> bathroom with CGA and RW, transferring to [facility] over toilet and TTB same manner. 3/3 toileting tasks with CGA. Covered incision on L side as well for water proofing and educated on how to do so. UB/LB bathing with CGA for standing portion also able to utilize figure four for BLE. Dried off seated prior to dressing with bra, underwear, and robe CGA overall. Ambulated back to recliner same as above, alarm on call bell in reach. Note pt educated throughout on pacing, breathing techniques, and decreasing nervousness/anxiety. O2 sats WNL throughout session and pt very fatigued.    Therapy Documentation Precautions:  Precautions Precautions: Other (comment) (fall risk, mild L hemi (LE>UE)) Precaution Comments: left foot drop Required Braces or Orthoses: Other Brace Other Brace: L AFO, not using since previous admission Restrictions Weight Bearing Restrictions: No     Therapy/Group: Individual Therapy  Viona Gilmore 06/21/2021, 1:59 PM

## 2021-06-21 NOTE — Progress Notes (Signed)
PROGRESS NOTE   Subjective/Complaints:  Pt reports that feels weak on L side- really weak,  Also has pain in bruising on L chest and L abdomen from bruising.   Also feels a little wobbly and also feels "pinpricks behind eyes".  Said she's had this before, and just watching it.    ROS:  Pt denies SOB, abd pain, CP, N/V/C/D, and vision changes  Objective:   No results found. Recent Labs    06/21/21 0516  WBC 9.2  HGB 11.2*  HCT 33.6*  PLT 245   Recent Labs    06/21/21 0516  NA 137  K 3.7  CL 103  CO2 25  GLUCOSE 110*  BUN 14  CREATININE 0.50  CALCIUM 8.5*   No intake or output data in the 24 hours ending 06/21/21 1031      Physical Exam: Vital Signs Blood pressure 130/69, pulse 69, temperature 97.7 F (36.5 C), temperature source Oral, resp. rate 18, height 5' 6.75" (1.695 m), weight 93.9 kg, SpO2 96 %.    General: awake, alert, appropriate, sitting EOB;  NAD HENT: conjugate gaze; oropharynx moist CV: regular rate; no JVD Pulmonary: CTA on R- s/p lobectomy on L- decreased breath sounds as a result- incision on L lateral lung area almost healed- lots of surrounding bruising GI: soft, NT, ND, (+)BS- lots of abd bruising- purple Psychiatric: appropriate Neurological: Ox3    Cervical back: Normal range of motion and neck supple.     Comments: LE edema No tenderness in extremities Left chest wall tenderness  Skin:    General: Skin is warm and dry.     Comments: Incision under left breast with skin glue. Prior CT tube site with suture in place. Diffuse ecchymosis from left upper back to flank to lower abdomen. Multiple areas of ecchymosis on abdomen.   Neurological:     Mental Status: She is alert and oriented to person, place, and time.     Comments: Alert Motor: B/l UE 5/5 proximal to distal RLE: 5/5 proximal to distal LLE: HF 2-/5, KE 2-5, ADF 0/5   Assessment/Plan: 1. Functional deficits  which require 3+ hours per day of interdisciplinary therapy in a comprehensive inpatient rehab setting. Physiatrist is providing close team supervision and 24 hour management of active medical problems listed below. Physiatrist and rehab team continue to assess barriers to discharge/monitor patient progress toward functional and medical goals  Care Tool:  Bathing              Bathing assist       Upper Body Dressing/Undressing Upper body dressing   What is the patient wearing?: Bra, Pull over shirt    Upper body assist Assist Level: Supervision/Verbal cueing    Lower Body Dressing/Undressing Lower body dressing      What is the patient wearing?: Pants     Lower body assist Assist for lower body dressing: Contact Guard/Touching assist     Toileting Toileting    Toileting assist Assist for toileting: Contact Guard/Touching assist     Transfers Chair/bed transfer  Transfers assist     Chair/bed transfer assist level: Contact Guard/Touching assist     Locomotion Ambulation  Ambulation assist              Walk 10 feet activity   Assist           Walk 50 feet activity   Assist           Walk 150 feet activity   Assist           Walk 10 feet on uneven surface  activity   Assist           Wheelchair     Assist               Wheelchair 50 feet with 2 turns activity    Assist            Wheelchair 150 feet activity     Assist          Blood pressure 130/69, pulse 69, temperature 97.7 F (36.5 C), temperature source Oral, resp. rate 18, height 5' 6.75" (1.695 m), weight 93.9 kg, SpO2 96 %.    Medical Problem List and Plan: 1.  Left sided weakness affecting ADLs and mobility secondary to lobectomy.              -patient may shower if incision covered             -ELOS/Goals: Supervision/Mod I 6-9 days.             first day of evaluations- con't PT and OT 2.   Antithrombotics: -DVT/anticoagulation:  Pharmaceutical: Lovenox             -antiplatelet therapy:  3. Pain Management: Gabapentin 600 mg/hs             --d/c Tramadol due to history of seizure episode.   9/29- has oxy 5-10 mg q4 hours prn for severe pain- con't regimen- no Tramadol!             Monitor with increased exertion 4. Mood: LCSW to follow for evaluation and support.              -antipsychotic agents: N/A 5. Neuropsych: This patient is capable of making decisions on her own behalf. 6. Skin/Wound Care: Monitor incision for healing.  7. Fluids/Electrolytes/Nutrition: Monitor I/Os CMP ordered for tomorrow AM  9/29- ALT up slightly at 55- will recheck Monday 8. Metastatic cancer: Continue decadron 2 mg bid 9. 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  10. RLS: Continue Requip hs    LOS: 1 days A FACE TO FACE EVALUATION WAS PERFORMED  Tara Murphy 06/21/2021, 10:31 AM

## 2021-06-21 NOTE — Progress Notes (Signed)
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 [facility])  Past Medical History:  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[place])    S/p total hip replacement left   Breast cancer ([place]) 2015   left    Cataract    Cholecystolithiasis    Chronic allergic rhinitis    Depression    Former smoker, stopped smoking in distant past 10/05/2019   Quit 2000; 49 y smoking history   GERD (gastroesophageal reflux disease)    Hepatic steatosis 06/08/2020   Intermittent elevated LFTs and ultrasound 2018   Hyperlipemia    IBS (irritable bowel syndrome) 10/08/2018   Nl colonoscopy and EGD 09/2018   IBS (irritable bowel syndrome)    Lichen plano-pilaris    Major depression, 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 Repeat 2020   Urinary tract infection    hx of frequent    Past Surgical History:  Past Surgical History:  Procedure Laterality Date   APPENDECTOMY     APPLICATION OF CRANIAL NAVIGATION Right 04/30/2021   Procedure: APPLICATION OF CRANIAL NAVIGATION;  Surgeon: Earnie Larsson, MD;  Location: [facility];  Service: Neurosurgery;  Laterality: Right;   BREAST LUMPECTOMY Left 2015   CATARACT EXTRACTION Bilateral    cataracts     CHOLECYSTECTOMY N/A 10/29/2016   Procedure: LAPAROSCOPIC CHOLECYSTECTOMY;  Surgeon: Erroll Luna, MD;  Location: [facility];  Service: General;  Laterality: N/A;   COLONOSCOPY     CRANIOTOMY Right 04/30/2021    Procedure: Right Parietal Craniotomy for tumor with Brain Lab;  Surgeon: Earnie Larsson, MD;  Location: [facility];  Service: Neurosurgery;  Laterality: Right;   EYE SURGERY     INTERCOSTAL NERVE BLOCK Left 06/13/2021   Procedure: INTERCOSTAL NERVE BLOCK;  Surgeon: Melrose Nakayama, MD;  Location: [facility];  Service: Thoracic;  Laterality: Left;   LIPOSUCTION     LYMPH NODE DISSECTION Left 06/13/2021   Procedure: LYMPH NODE DISSECTION;  Surgeon: Melrose Nakayama, MD;  Location: [facility];  Service: Thoracic;  Laterality: Left;   TOTAL HIP ARTHROPLASTY     bilateral hip arthoplasty   TOTAL HIP ARTHROPLASTY Right 12/18/2015   Procedure: RIGHT TOTAL HIP ARTHROPLASTY ANTERIOR APPROACH;  Surgeon: Gaynelle Arabian, MD;  Location: [facility];  Service: Orthopedics;  Laterality: Right;   TOTAL KNEE ARTHROPLASTY     TOTAL KNEE ARTHROPLASTY Left 11/25/2016   Procedure: LEFT TOTAL KNEE ARTHROPLASTY;  Surgeon: Gaynelle Arabian, MD;  Location: [facility];  Service: Orthopedics;  Laterality: Left;  with abductor block   TUBAL LIGATION     UPPER GASTROINTESTINAL ENDOSCOPY     Social History:  reports that she quit smoking about 29 years ago. Her smoking use included cigarettes. She smoked an average of 1 pack per day. She has never used smokeless tobacco. She reports current alcohol use of about 14.0 standard drinks per week. She reports that she does not use drugs.  Family / Support  Systems Marital Status: Married Patient Roles: Spouse Spouse/Significant Other: Coralyn Mark [phone] Children: Shannon-daughter [phone] local daughter has another daughter in [place] Other Supports: Friends Anticipated Caregiver: Coralyn Mark Ability/Limitations of Caregiver: Husband has back issues and some mild cognitive issues Caregiver Availability: 24/7 Family Dynamics: Close knit with both daughter's this is pt's second marriage all get along well. Moved here from [place] to be closer to her daughter and grandchildren  Social  History Preferred language: English Religion: Unknown Cultural Background: No issues Education: Medical sales representative - How often do you need to have someone help you when you read instructions, pamphlets, or other written material from your doctor or pharmacy?: Never Writes: Yes Employment Status: Retired Public relations account executive Issues: No issues Guardian/Conservator: none-according to MD pt is capable of making her own decisions while here. Husband here daily to provide support   Abuse/Neglect Abuse/Neglect Assessment Can Be Completed: Yes Physical Abuse: Denies Verbal Abuse: Denies Sexual Abuse: Denies Exploitation of patient/patient's resources: Denies Self-Neglect: Denies  Patient response to: Social Isolation - How often do you feel lonely or isolated from those around you?: Rarely  Emotional Status Pt's affect, behavior and adjustment status: Pt is doing well and glad her surgeries are over and she can focus on her recovery and getting stronger again. She is not taking any pain meds and is ready for rehab. She has had radiation and this is completed and she reports MD got all of her cancer in her lung so she needs to get stronger now. Recent Psychosocial Issues: Doing well and completed radiation and now this surgery Psychiatric History: History of depression/anxiety takes medication for this and finds it helpful also to talk it out. She is optimistic and future oriented. She seems to be in a good place and thankful to be at the place she is at currently Substance Abuse History: No issues  Patient / Family Perceptions, Expectations & Goals Pt/Family understanding of illness & functional limitations: Pt and husband have a gooid understanding of her proceudres and surgeries, boith are thankful she is doing as well as she is and recovering. She feels her prognosis is good now and ready to get recovered from all of this. Premorbid pt/family roles/activities: wife, MOm,  grandmother, retiree, friend, etc Anticipated changes in roles/activities/participation: resume Pt/family expectations/goals: Pt states: " I am in a good place now and feel what's left is recovery from lung surgery."  Husband is very supportive and will provide what she needs at home  [employer]: Other (Comment) ([place]) Premorbid Home Care/DME Agencies: Other (Comment) ([facility] at [facility]-resume once DC) Transportation available at discharge: Husband and daughter Is the patient able to respond to transportation needs?: Yes In the past 12 months, has lack of transportation kept you from medical appointments or from getting medications?: No In the past 12 months, has lack of transportation kept you from meetings, work, or from getting things needed for daily living?: No  Discharge Planning Living Arrangements: Spouse/significant other Support Systems: Spouse/significant other, Children, Friends/neighbors Type of Residence: Private residence Insurance underwriter Resources: [employer], Multimedia programmer (specify) Web designer) Financial Resources: Social Security, Family Support Financial Screen Referred: No Living Expenses: Own Money Management: Patient, Spouse Does the patient have any problems obtaining your medications?: No Home Management: Both she and husband Patient/Family Preliminary Plans: Return home with husband who was assisting her with samll tasks. Her hope is to get back to OP Rehab and recover from the weakness she has now from surgery. Care Coordinator Barriers to Discharge:  Decreased caregiver support Care Coordinator Barriers to Discharge Comments: husband back issues Care Coordinator Anticipated Follow Up Needs: HH/OP  Clinical Impression Pleasant female who is known to his worker from past admission last month. Pt is ready to work and get stronger she was doing well at home prior to her lung surgery and has become weaker on her affected side. Will  work on discharge needs and await therapy evaluations.  Elease Hashimoto 06/21/2021, 10:33 AM

## 2021-06-21 NOTE — Progress Notes (Signed)
Occupational Therapy Session Note  Patient Details  Name: Tara Murphy MRN: [id number] Date of Birth: 09-May-1946  Today's Date: 06/22/2021 OT Individual Time: 2947-6546 OT Individual Time Calculation (min): 60 min   Short Term Goals: Week 1:  OT Short Term Goal 1 (Week 1): STGs = LTGs at Mod I/Supervision  Skilled Therapeutic Interventions/Progress Updates:    Pt greeted in the recliner with no c/o pain. ADL needs met. She wanted to do something fun today to celebrate some good news about her medical dx. Pt used the RW to ambulate to the dayroom with close supervision-CGA, vcs for forward gaze. She opted to participate in Wii Just Dance while seated vs standing, though was provided with opportunities to stand. Pt engaged in dancing to work on Lt sided coordination, Lt sided strength, activity tolerance, and dynamic balance. Min vcs to increase Lt LE movement, pt did well incorporating the Lt UE during bilateral dance exercises. Note that she needed rest breaks in between songs. At end of session pt was returned to the room via w/c and used the RW to ambulate back to her recliner. Pts affect very bright, grateful for the opportunity to dance today. She remained sitting up with all needs within reach.   Therapy Documentation Precautions:  Precautions Precautions: Other (comment) (fall risk, mild L hemi (LE>UE)) Precaution Comments: left foot drop Required Braces or Orthoses: Other Brace Other Brace: L AFO, not using since previous admission Restrictions Weight Bearing Restrictions: No  ADL: ADL Eating: Not assessed Grooming: Setup, Supervision/safety Upper Body Bathing: Setup Lower Body Bathing: Contact guard Upper Body Dressing: Setup Lower Body Dressing: Contact guard Toileting: Contact guard Toilet Transfer: Contact guard Toilet Transfer Method: Ambulating     Therapy/Group: Individual Therapy  Nakeda Lebron A Canaan Holzer 06/22/2021, 4:44 PM

## 2021-06-21 NOTE — Plan of Care (Signed)
  Problem: RH Balance Goal: LTG Patient will maintain dynamic standing with ADLs (OT) Description: LTG:  Patient will maintain dynamic standing balance with assist during activities of daily living (OT)  Flowsheets (Taken 06/21/2021 1658) LTG: Pt will maintain dynamic standing balance during ADLs with: Supervision/Verbal cueing   Problem: Sit to Stand Goal: LTG:  Patient will perform sit to stand in prep for activites of daily living with assistance level (OT) Description: LTG:  Patient will perform sit to stand in prep for activites of daily living with assistance level (OT) Flowsheets (Taken 06/21/2021 1658) LTG: PT will perform sit to stand in prep for activites of daily living with assistance level: Independent with assistive device   Problem: RH Grooming Goal: LTG Patient will perform grooming w/assist,cues/equip (OT) Description: LTG: Patient will perform grooming with assist, with/without cues using equipment (OT) Flowsheets (Taken 06/21/2021 1658) LTG: Pt will perform grooming with assistance level of: Independent with assistive device    Problem: RH Bathing Goal: LTG Patient will bathe all body parts with assist levels (OT) Description: LTG: Patient will bathe all body parts with assist levels (OT) Flowsheets (Taken 06/21/2021 1658) LTG: Pt will perform bathing with assistance level/cueing: Supervision/Verbal cueing   Problem: RH Dressing Goal: LTG Patient will perform upper body dressing (OT) Description: LTG Patient will perform upper body dressing with assist, with/without cues (OT). Flowsheets (Taken 06/21/2021 1658) LTG: Pt will perform upper body dressing with assistance level of: Set up assist Goal: LTG Patient will perform lower body dressing w/assist (OT) Description: LTG: Patient will perform lower body dressing with assist, with/without cues in positioning using equipment (OT) Flowsheets (Taken 06/21/2021 1658) LTG: Pt will perform lower body dressing with assistance  level of: Supervision/Verbal cueing   Problem: RH Toileting Goal: LTG Patient will perform toileting task (3/3 steps) with assistance level (OT) Description: LTG: Patient will perform toileting task (3/3 steps) with assistance level (OT)  Flowsheets (Taken 06/21/2021 1658) LTG: Pt will perform toileting task (3/3 steps) with assistance level: Independent with assistive device   Problem: RH Simple Meal Prep Goal: LTG Patient will perform simple meal prep w/assist (OT) Description: LTG: Patient will perform simple meal prep with assistance, with/without cues (OT). Flowsheets (Taken 06/21/2021 1658) LTG: Pt will perform simple meal prep with assistance level of: Supervision/Verbal cueing   Problem: RH Toilet Transfers Goal: LTG Patient will perform toilet transfers w/assist (OT) Description: LTG: Patient will perform toilet transfers with assist, with/without cues using equipment (OT) Flowsheets (Taken 06/21/2021 1658) LTG: Pt will perform toilet transfers with assistance level of: Independent with assistive device   Problem: RH Tub/Shower Transfers Goal: LTG Patient will perform tub/shower transfers w/assist (OT) Description: LTG: Patient will perform tub/shower transfers with assist, with/without cues using equipment (OT) Flowsheets (Taken 06/21/2021 1658) LTG: Pt will perform tub/shower stall transfers with assistance level of: Supervision/Verbal cueing

## 2021-06-21 NOTE — Progress Notes (Signed)
Inpatient Rehabilitation  Patient information reviewed and entered into eRehab system by [name] Tara Murphy, OTR/L.   Information including medical coding, functional ability and quality indicators will be reviewed and updated through discharge.    

## 2021-06-22 ENCOUNTER — Telehealth: Payer: Self-pay

## 2021-06-22 MED ORDER — DEXAMETHASONE 2 MG PO TABS
2.0000 mg | ORAL_TABLET | Freq: Every day | ORAL | Status: AC
  Administered 2021-06-23 – 2021-06-29 (×7): 2 mg via ORAL
  Filled 2021-06-22 (×7): qty 1

## 2021-06-22 NOTE — IPOC Note (Signed)
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 [facility])     Functional Problem List: Nursing Edema, Endurance, Medication Management, Nutrition, Safety, Skin Integrity  PT Balance, Safety, Edema, Sensory, Motor, Endurance  OT Balance, Endurance, Motor, Safety, Perception  SLP    TR         Basic ADL's: OT Grooming, Bathing, Dressing, Toileting     Advanced  ADL's: OT       Transfers: PT Bed Mobility, Bed to Chair, Car, Manufacturing systems engineer, Metallurgist: PT Ambulation, Stairs     Additional Impairments: OT None  SLP        TR      Anticipated Outcomes Item Anticipated Outcome  Self Feeding no goal  Swallowing      Basic self-care  Supervision  Toileting  Mod I   Bathroom Transfers Supervision shower, Mod I toilet  Bowel/Bladder  N/A  Transfers  mod I  Locomotion  supervision with LRAD  Communication     Cognition     Pain  N/A  Safety/Judgment  supervision and no falls   Therapy Plan: PT Intensity: Minimum of 1-2 x/day ,45 to 90 minutes PT Frequency: 5 out of 7 days PT Duration Estimated Length of Stay: 7-10 days OT Intensity: Minimum of 1-2 x/day, 45 to 90 minutes OT Frequency: 5 out of 7 days OT Duration/Estimated Length of Stay: 7-10 days     Due to the current state of emergency, patients may not be receiving their 3-hours of Medicare-mandated therapy.   Team Interventions: Nursing Interventions Patient/Family Education, Disease Management/Prevention, Medication Management, Skin Care/Wound Management, Discharge Planning  PT interventions Ambulation/gait training, Discharge planning, Functional mobility training, Psychosocial support, Therapeutic Activities, Visual/perceptual remediation/compensation, Balance/vestibular  training, Disease management/prevention, Neuromuscular re-education, Skin care/wound management, Therapeutic Exercise, Wheelchair propulsion/positioning, Cognitive remediation/compensation, DME/adaptive equipment instruction, Pain management, Splinting/orthotics, UE/LE Strength taining/ROM, Community reintegration, Barrister's clerk education, Technical sales engineer stimulation, IT trainer, UE/LE Coordination activities  OT Interventions Training and development officer, Disease mangement/prevention, Neuromuscular re-education, Self Care/advanced ADL retraining, Therapeutic Exercise, Wheelchair propulsion/positioning, UE/LE Strength taining/ROM, Skin care/wound managment, Pain management, DME/adaptive equipment instruction, Cognitive remediation/compensation, Community reintegration, Functional electrical stimulation, Patient/family education, Splinting/orthotics, UE/LE Coordination activities, Therapeutic Activities, Psychosocial support, Functional mobility training, Discharge planning, Visual/perceptual remediation/compensation  SLP Interventions    TR Interventions    SW/CM Interventions Discharge Planning, Psychosocial Support, Patient/Family Education   Barriers to Discharge MD  Medical stability, Home enviroment access/loayout, Wound care, Weight, Pending chemo/radiation, and new Cancer dx  Nursing Decreased caregiver support, Home environment access/layout, Wound Care, Lack of/limited family support, Weight, Medication compliance, Pending chemo/radiation Lives in 1 level home with 1 step to enter and no rails. Husband is a back patient and can't lift. Husband able to provide 24/7 supervision at discharge.  PT Decreased caregiver support, Lack of/limited family support Pt's husband unable to provide physical assist  OT      SLP      SW Decreased caregiver support husband back issues   Team Discharge Planning: Destination: PT-Home ,OT- Home , SLP-  Projected Follow-up: PT-Outpatient PT, OT-   Outpatient OT, SLP-  Projected Equipment Needs: PT-Other (comment), OT- To be determined, SLP-  Equipment Details: PT-Pt owns equipment from previous stay, do not anticipate more equipment needs, OT-  Patient/family involved in discharge planning: PT- Patient,  OT-Patient, SLP-   MD ELOS: 7-10 days Medical Rehab Prognosis:  Good Assessment: Pt is a 75 yr old with newly dx'd nonsmall cell poorly differentiated cancer  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 CTS went over dx with pt today.   Goals mod I to supervision    See Team Conference Notes for weekly updates to the plan of care

## 2021-06-22 NOTE — Progress Notes (Signed)
Orthopedic Tech Progress Note Patient Details:  [name] [birth year] [id number] Called order into [facility] Patient ID: Tara Murphy, female   DOB: 1945/12/11, 75 y.o.   MRN: [id number]  [name] 06/22/2021, 6:34 PM

## 2021-06-22 NOTE — Progress Notes (Signed)
Physical Therapy Session Note  Patient Details  Name: Tara Murphy MRN: [id number] Date of Birth: [birth year]  Today's Date: 06/22/2021 PT Individual Time: 0907-1000 PT Individual Time Calculation (min): 53 min   Short Term Goals: Week 1:  PT Short Term Goal 1 (Week 1): =LTGs d/t ELOS  Skilled Therapeutic Interventions/Progress Updates:    Pt received in recliner and agreeable to therapy.  No complaint of pain, pt in very good spirits d/t receiving word that pathology revealed clear margins in her lymph nodes! ambulatory transfer recl<>w/c with RW and CGA. Pt transported to therapy gym for time management and energy conservation. Pt performed car transfer with CGA, recalling technique from previous stay and adjusting strategies without cues. Rest of session spent in //bars working on side stepping, 3 x 14 ft, to improve hip strength and stability for improved gait mechanics. Pt required mod A to advance LLE to side, maintaining neutral rotation. Focus on using hamstrings and hip flexors to lift leg and abductors to stabilize hip. Noted difficulty maintaining L heel on ground when feet were placed evenly on ground to stand. DF ROM restriction noted. Discussed with PA, Pam, who ordered [facility] for night time. Reinforced request to have pt bring in AFO from home. Pt returned to room and to recliner in same manner as above and was left with all needs in reach and alarm active.   Therapy Documentation Precautions:  Precautions Precautions: Other (comment) (fall risk, mild L hemi (LE>UE)) Precaution Comments: left foot drop Required Braces or Orthoses: Other Brace Other Brace: L AFO, not using since previous admission Restrictions Weight Bearing Restrictions: No   Therapy/Group: Individual Therapy  Mickel Fuchs 06/22/2021, 10:02 AM

## 2021-06-22 NOTE — Progress Notes (Signed)
PROGRESS NOTE   Subjective/Complaints:  Pt reports sleepy this AM- slept OK til 4:30am.   Asking about Pathology- there's a note from CTS that they went over pathology results with her.   ROS:  Pt denies SOB, abd pain, CP, N/V/C/D, and vision changes   Objective:   No results found. Recent Labs    06/21/21 0516  WBC 9.2  HGB 11.2*  HCT 33.6*  PLT 245   Recent Labs    06/21/21 0516  NA 137  K 3.7  CL 103  CO2 25  GLUCOSE 110*  BUN 14  CREATININE 0.50  CALCIUM 8.5*    Intake/Output Summary (Last 24 hours) at 06/22/2021 0916 Last data filed at 06/22/2021 0725 Gross per 24 hour  Intake 597 ml  Output --  Net 597 ml        Physical Exam: Vital Signs Blood pressure (!) 123/56, pulse 68, temperature 97.8 F (36.6 C), temperature source Oral, resp. rate 16, height 5' 6.75" (1.695 m), weight 93.9 kg, SpO2 94 %.     General: awake, alert, appropriate, sitting up in bed; looks sleepy;  NAD HENT: conjugate gaze; oropharynx moist CV: regular rate; no JVD Pulmonary: CTA B/L on R- decreased from lobectomy on L GI: soft, NT, ND, (+)BS Psychiatric: appropriate Neurological: Ox3     Cervical back: Normal range of motion and neck supple.     Comments: LE edema No tenderness in extremities Left chest wall tenderness with purple bruising -  Skin:    General: Skin is warm and dry.     Comments: Incision under left breast with skin glue. Prior CT tube site with suture in place. Diffuse ecchymosis from left upper back to flank to lower abdomen. Multiple areas of ecchymosis on abdomen. Looks stable today  Neurological:     Mental Status: She is alert and oriented to person, place, and time.     Comments: Alert Motor: B/l UE 5/5 proximal to distal RLE: 5/5 proximal to distal LLE: HF 2-/5, KE 2-5, ADF 0/5   Assessment/Plan: 1. Functional deficits which require 3+ hours per day of interdisciplinary therapy in a  comprehensive inpatient rehab setting. Physiatrist is providing close team supervision and 24 hour management of active medical problems listed below. Physiatrist and rehab team continue to assess barriers to discharge/monitor patient progress toward functional and medical goals  Care Tool:  Bathing    Body parts bathed by patient: Face, Right arm, Left arm, Chest, Abdomen, Front perineal area, Right upper leg, Left upper leg, Right lower leg, Left lower leg, Buttocks         Bathing assist Assist Level: Contact Guard/Touching assist     Upper Body Dressing/Undressing Upper body dressing   What is the patient wearing?: Bra, Pull over shirt    Upper body assist Assist Level: Supervision/Verbal cueing    Lower Body Dressing/Undressing Lower body dressing      What is the patient wearing?: Pants     Lower body assist Assist for lower body dressing: Contact Guard/Touching assist     Toileting Toileting    Toileting assist Assist for toileting: Supervision/Verbal cueing     Transfers Chair/bed transfer  Transfers assist     Chair/bed transfer assist level: Contact Guard/Touching assist     Locomotion Ambulation   Ambulation assist      Assist level: Contact Guard/Touching assist Assistive device: Walker-rolling Max distance: 100 ft   Walk 10 feet activity   Assist     Assist level: Contact Guard/Touching assist Assistive device: Walker-rolling   Walk 50 feet activity   Assist    Assist level: Contact Guard/Touching assist Assistive device: Walker-rolling    Walk 150 feet activity   Assist Walk 150 feet activity did not occur: Safety/medical concerns         Walk 10 feet on uneven surface  activity   Assist Walk 10 feet on uneven surfaces activity did not occur: Safety/medical concerns         Wheelchair     Assist Is the patient using a wheelchair?: Yes Type of Wheelchair: Manual (transport only)    Wheelchair assist  level: Dependent - Patient 0%      Wheelchair 50 feet with 2 turns activity    Assist        Assist Level: Dependent - Patient 0%   Wheelchair 150 feet activity     Assist      Assist Level: Dependent - Patient 0%   Blood pressure (!) 123/56, pulse 68, temperature 97.8 F (36.6 C), temperature source Oral, resp. rate 16, height 5' 6.75" (1.695 m), weight 93.9 kg, SpO2 94 %.    Medical Problem List and Plan: 1.  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 .              -patient may shower if incision covered             -ELOS/Goals: Supervision/Mod I 6-9 days.             con't PT and OT/CIR-  2.  Antithrombotics: -DVT/anticoagulation:  Pharmaceutical: Lovenox             -antiplatelet therapy:  3. Pain Management: Gabapentin 600 mg/hs             --d/c Tramadol due to history of seizure episode.   9/29- has oxy 5-10 mg q4 hours prn for severe pain- con't regimen- no Tramadol!             Monitor with increased exertion 4. Mood: LCSW to follow for evaluation and support.              -antipsychotic agents: N/A 5. Neuropsych: This patient is capable of making decisions on her own behalf. 6. Skin/Wound Care: Monitor incision for healing.  7. Fluids/Electrolytes/Nutrition: Monitor I/Os CMP ordered for tomorrow AM  9/29- ALT up slightly at 55- will recheck Monday 8. Metastatic cancer: Continue decadron 2 mg bid  9/30- Path T2A, N0- poorly differentiated nonsmall cell CA- CTS surgery went over path results with pt- leaving message for Oncology to see what plan is for Decadron as well as treatment after d/c? 9. 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   9/30- sleepy, but slept well- will monitor 10. RLS: Continue Requip hs    LOS: 2 days A FACE TO FACE EVALUATION WAS PERFORMED  Yichen Gilardi 06/22/2021, 9:16 AM

## 2021-06-22 NOTE — Progress Notes (Signed)
Occupational Therapy Session Note  Patient Details  Name: Tara Murphy MRN: [id number] Date of Birth: 12/07/1945  Today's Date: 06/22/2021 OT Individual Time: 0165-5374 OT Individual Time Calculation (min): 55 min    Short Term Goals: Week 1:  OT Short Term Goal 1 (Week 1): STGs = LTGs at Mod I/Supervision  Skilled Therapeutic Interventions/Progress Updates:    Pt greeted semi-reclined in bed with MD finishing up rounds. Pt agreeable to OT treatment session focused on self-care retraining and endurance. Pt ambulated in room with RW and CGA with L LE foot dragging requiring cues to try to lift LLE with step. Pt able to access drawers and cabinet to collect clothing with close supervision. Pt ambulated into bathroom in similar fashion and sat on commode to void. Pt then completed bathing from tub bench in shower with close supervision and set-up A. Pt ambulated back to bed and sat EOB to don clothing with min A to get on shoes and socks. Multiple rest breaks requried with endurance and LLE weakness being pt's larges limitation at this time. Pt ambulated to recliner with CGA and RW and left seated in recliner with alarm on and needs met.   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

## 2021-06-22 NOTE — Progress Notes (Signed)
Occupational Therapy Session Note  Patient Details  Name: Tara Murphy MRN: [id number] Date of Birth: July 28, 1946  Today's Date: 06/23/2021 OT Individual Time: 1103-1200 and 1400-1459 OT Individual Time Calculation (min): 57 min and 59 min   Short Term Goals: Week 1:  OT Short Term Goal 1 (Week 1): STGs = LTGs at Mod I/Supervision  Skilled Therapeutic Interventions/Progress Updates:    Pt greeted in the recliner with no c/o pain, requesting to shower. Pt completed toileting (+bladder void), bathing (at seated level in shower), dressing (sit<stand from TTB using RW), and oral care/grooming tasks (standing at the sink) during session. All functional transfers completed at ambulatory level using RW with supervision assistance. Pt did well with hemi dressing strategies and obtaining figure 4 position bilaterally though did need assistance to don gripper socks due to fatigue. Supervision for dynamic sitting + standing balance today. Pt transferred to the recliner at close of session, all needs within reach.   2nd Session 1:1 tx (59 min) Pt greeted in the recliner with no c/o pain. Agreeable to leave the unit and engage in tx in a community setting. Escorted pt via w[facility]. Worked on B UE strengthening/Lt NMR while self propelling the w/c through the gift shop and food court areas. Pt ambulated using RW from the gift shop to [employer] to order some snacks and a drink while standing at the counter, paying at the counter with close supervision assistance. Pt then sat outside for a bit to enjoy the fresh air for psychosocial health and wellbeing. Discussed importance of returning to meaningful hobbies at home to nurture psychosocial health. She was then escorted back to the room, ambulatory transfer to the w/c completed using RW with close supervision assistance. Pt remained in the chair, left with all needs within reach.   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

## 2021-06-22 NOTE — Telephone Encounter (Signed)
Msg received from [facility] pt wanted to speak with someone about what her plan is moving forward. PT is currently in rehab.  Discussed with Dr. Chryl Heck who advised the plan hasn't changed and she will need to keep her follow-up appt with Dr. Burr Medico on 07/11/21 to discuss immunotherapy. Pt expressed understanding of this information.

## 2021-06-22 NOTE — Progress Notes (Signed)
Physical Therapy Session Note  Patient Details  Name: Tara Murphy MRN: [id number] Date of Birth: [birth year]  Today's Date: 06/22/2021 PT Individual Time: 5207-4097 PT Individual Time Calculation (min): 45 min   Short Term Goals: Week 1:  PT Short Term Goal 1 (Week 1): =LTGs d/t ELOS   Skilled Therapeutic Interventions/Progress Updates:   Pt received sitting in recliner and agreeable to PT. Pt performed ambulatory transfer to [facility] with supervision assist and RW. Pt transported to rehab gym in [facility]. Sit<>stand x 2 to don Litegait harness with supervision assist. BWSTT 3 min +2 min at 0.5 and 0.6 mph. Min-mod Assist from PT to improve weight shift to the R to allow advancement of the LLE in swing.   Pt returned to room and performed stand pivot transfer to bed with RW and supervision assist. Sit>supine completed with supervision assist and cues for management of the LLE. Pt left supine in bed with call bell in reach and all needs met.        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

## 2021-06-22 NOTE — Discharge Instructions (Addendum)
Inpatient Rehab Discharge Instructions  [facility] Discharge date and time:  06/29/21  Activities/Precautions/ Functional Status: Activity: no lifting, driving, or strenuous exercise till cleared by MD Diet: regular diet Wound Care: keep wound clean and dry   Functional status:  ___ No restrictions     ___ Walk up steps independently ___ 24/7 supervision/assistance   ___ Walk up steps with assistance _X__ Intermittent supervision/assistance  _X__ Bathe/dress independently ___ Walk with walker     ___ Bathe/dress with assistance ___ Walk Independently    ___ Shower independently ___ Walk with assistance    ___ Shower with assistance _X__ No alcohol     ___ Return to work/school ________  Special Instructions:    COMMUNITY REFERRALS UPON DISCHARGE:    Outpatient: PT & OT             Agency: [facility] Phone: [phone]             Appointment Date/Time: WILL CALL PATIENT TO ARRANGE APPOINTMENTS  Medical Equipment/Items Ordered: HAS ALL EQUIPMENT FROM PREVIOUS ADMISSION                                                 Agency/Supplier: NA    My questions have been answered and I understand these instructions. I will adhere to these goals and the provided educational materials after my discharge from the hospital.  Patient/Caregiver Signature _______________________________ Date __________  Clinician Signature _______________________________________ Date __________  Please bring this form and your medication list with you to all your follow-up doctor's appointments.

## 2021-06-22 NOTE — Progress Notes (Signed)
      [address]       [place] [address]             [phone]      Up in chair. No complaints Says she is not having any pain  Path - T2a,N0-  poorly differentiated non-small cell carcinoma  Doing well with rehab  Remo Lipps C. Roxan Hockey, MD [facility] [phone]

## 2021-06-23 DIAGNOSIS — C3490 Malignant neoplasm of unspecified part of unspecified bronchus or lung: Secondary | ICD-10-CM

## 2021-06-23 DIAGNOSIS — C7931 Secondary malignant neoplasm of brain: Secondary | ICD-10-CM

## 2021-06-23 NOTE — Progress Notes (Signed)
Physical Therapy Session Note  Patient Details  Name: Tara Murphy MRN: [id number] Date of Birth: 05/30/1946  Today's Date: 06/23/2021 PT Individual Time: 0915-1014 PT Individual Time Calculation (min): 59 min   Short Term Goals: Week 1:  PT Short Term Goal 1 (Week 1): =LTGs d/t ELOS  Skilled Therapeutic Interventions/Progress Updates:    Pt received sitting in recliner with no c/o pn and agreeable to PT. Session focused on L hip strengthening, ambulation, and functional balance. Pt transferred to [facility] with supervision, and then was taken to therapy gym for time conservation. In gym, pt completed 2x10 marches with B rail hold and CGA to focus promoting L hip stability during single-leg support. Pt then completed 2 x 10 standing heel raises with B rails and CGA. Pt required initial VC to straighten L knee, but was able to achieve and maintain. Pt then completed 2x10 saggital plane weight shifting with front leg on low step, and emphasize on push off from back foot onto front foot. Pt given VC to maintain straight back leg for both R and L turns, but required further tactile cues with L LE in back. Pt then completed lateral weight shifting with alternating hip abd for 1 x10 with Cga + B rails. Pt then completed dynamic standing activity of placing cards on velcro wall for 2x with supervision and no RW.  Pt continued to completed 3x 92ft amb with CGA + RW. Pt returned to room and completed 4 x sit to stand with no RW and supervision with VC for control. At end of session, pt was left seated in recliner with alarm engaged, call bell and all needs in reach.  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

## 2021-06-23 NOTE — Progress Notes (Signed)
PROGRESS NOTE   Subjective/Complaints:  Patient had a good session with OT today.  No pain complaints no issues with constipation or bladder issues.  Denies any breathing problems or cough Last bowel movement 06/22/2021 ROS:  Pt denies SOB, abd pain, CP, N/V/C/D, and vision changes   Objective:   No results found. Recent Labs    06/21/21 0516  WBC 9.2  HGB 11.2*  HCT 33.6*  PLT 245    Recent Labs    06/21/21 0516  NA 137  K 3.7  CL 103  CO2 25  GLUCOSE 110*  BUN 14  CREATININE 0.50  CALCIUM 8.5*     Intake/Output Summary (Last 24 hours) at 06/23/2021 1159 Last data filed at 06/23/2021 0804 Gross per 24 hour  Intake 822 ml  Output --  Net 822 ml         Physical Exam: Vital Signs Blood pressure (!) 113/51, pulse 62, temperature 97.8 F (36.6 C), temperature source Oral, resp. rate 16, height 5' 6.75" (1.695 m), weight 93.9 kg, SpO2 96 %.   General: No acute distress Mood and affect are appropriate Heart: Regular rate and rhythm no rubs murmurs or extra sounds Lungs: Clear to auscultation, breathing unlabored, no rales or wheezes Abdomen: Positive bowel sounds, soft nontender to palpation, nondistended Extremities: No clubbing, cyanosis, or edema Skin: No evidence of breakdown, no evidence of rash    Cervical back: Normal range of motion and neck supple.     Comments: LE edema No tenderness in extremities Left chest wall tenderness with purple bruising -  Skin:    General: Skin is warm and dry.   Neurological:     Mental Status: She is alert and oriented to person, place, and time.     Comments: Alert Motor: B/l UE 5/5 proximal to distal RLE: 5/5 proximal to distal LLE: HF 2-/5, KE 2-5, ADF 0/5   Assessment/Plan: 1. Functional deficits which require 3+ hours per day of interdisciplinary therapy in a comprehensive inpatient rehab setting. Physiatrist is providing close team supervision  and 24 hour management of active medical problems listed below. Physiatrist and rehab team continue to assess barriers to discharge/monitor patient progress toward functional and medical goals  Care Tool:  Bathing    Body parts bathed by patient: Face, Right arm, Left arm, Chest, Abdomen, Front perineal area, Right upper leg, Left upper leg, Right lower leg, Left lower leg, Buttocks         Bathing assist Assist Level: Contact Guard/Touching assist     Upper Body Dressing/Undressing Upper body dressing   What is the patient wearing?: Bra, Pull over shirt    Upper body assist Assist Level: Supervision/Verbal cueing    Lower Body Dressing/Undressing Lower body dressing      What is the patient wearing?: Pants     Lower body assist Assist for lower body dressing: Contact Guard/Touching assist     Toileting Toileting    Toileting assist Assist for toileting: Supervision/Verbal cueing     Transfers Chair/bed transfer  Transfers assist     Chair/bed transfer assist level: Contact Guard/Touching assist     Locomotion Ambulation   Ambulation assist  Assist level: Contact Guard/Touching assist Assistive device: Walker-rolling Max distance: 10   Walk 10 feet activity   Assist     Assist level: Contact Guard/Touching assist Assistive device: Walker-rolling   Walk 50 feet activity   Assist    Assist level: Contact Guard/Touching assist Assistive device: Walker-rolling    Walk 150 feet activity   Assist Walk 150 feet activity did not occur: Safety/medical concerns         Walk 10 feet on uneven surface  activity   Assist Walk 10 feet on uneven surfaces activity did not occur: Safety/medical concerns         Wheelchair     Assist Is the patient using a wheelchair?: Yes Type of Wheelchair: Manual (transport only)    Wheelchair assist level: Dependent - Patient 0%      Wheelchair 50 feet with 2 turns  activity    Assist        Assist Level: Dependent - Patient 0%   Wheelchair 150 feet activity     Assist      Assist Level: Dependent - Patient 0%   Blood pressure (!) 113/51, pulse 62, temperature 97.8 F (36.6 C), temperature source Oral, resp. rate 16, height 5' 6.75" (1.695 m), weight 93.9 kg, SpO2 96 %.    Medical Problem List and Plan: 1.  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 .              -patient may shower if incision covered             -ELOS/Goals: Supervision/Mod I 6-9 days.             con't PT and OT/CIR-  2.  Antithrombotics: -DVT/anticoagulation:  Pharmaceutical: Lovenox             -antiplatelet therapy:  3. Pain Management: Gabapentin 600 mg/hs             --d/c Tramadol due to history of seizure episode.   9/29- has oxy 5-10 mg q4 hours prn for severe pain- con't regimen- no Tramadol!             Monitor with increased exertion, pain control is good 4. Mood: LCSW to follow for evaluation and support.              -antipsychotic agents: N/A 5. Neuropsych: This patient is capable of making decisions on her own behalf. 6. Skin/Wound Care: Monitor incision for healing.  7. Fluids/Electrolytes/Nutrition: Monitor I/Os CMP ordered for tomorrow AM  9/29- ALT up slightly at 55- will recheck Monday 8. Metastatic cancer: Continue decadron 2 mg bid  9/30- Path T2A, N0- poorly differentiated nonsmall cell CA- CTS surgery went over path results with pt- leaving message for Oncology to see what plan is for Decadron as well as treatment after d/c? 9. 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   9/30- sleepy, but slept well- will monitor 10. RLS: Continue Requip hs    LOS: 3 days A FACE TO FACE EVALUATION WAS PERFORMED  Charlett Blake 06/23/2021, 11:59 AM

## 2021-06-24 NOTE — Progress Notes (Signed)
Occupational Therapy Session Note  Patient Details  Name: Tara Murphy MRN: [id number] Date of Birth: 17-Sep-1946  Today's Date: 06/25/2021 OT Individual Time: 0900-0930 OT Individual Time Calculation (min): 30 min   Short Term Goals: Week 1:  OT Short Term Goal 1 (Week 1): STGs = LTGs at Mod I/Supervision  Skilled Therapeutic Interventions/Progress Updates:    Pt greeted while sitting EOB, requesting to use the restroom. Ambulatory transfer to the toilet completed using RW with supervision assistance. Distant supervision for toileting tasks and then pt ambulated to the sink to wash her hands and complete oral care. She took a short break after due to limited standing endurance. Used the RW to gather clothing items in prep for shower with next OT, stooping to retrieve socks and underwear from drawers without LOB. She remained sitting in the recliner at close of session, all needs within reach. Tx focus placed on functional transfers, dynamic balance, ADL retraining.   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

## 2021-06-25 LAB — CBC
HCT: 31.6 % — ABNORMAL LOW (ref 36.0–46.0)
Hemoglobin: 10.7 g/dL — ABNORMAL LOW (ref 12.0–15.0)
MCH: 31.3 pg (ref 26.0–34.0)
MCHC: 33.9 g/dL (ref 30.0–36.0)
MCV: 92.4 fL (ref 80.0–100.0)
Platelets: 260 10*3/uL (ref 150–400)
RBC: 3.42 MIL/uL — ABNORMAL LOW (ref 3.87–5.11)
RDW: 15.3 % (ref 11.5–15.5)
WBC: 8.6 10*3/uL (ref 4.0–10.5)
nRBC: 0.2 % (ref 0.0–0.2)

## 2021-06-25 LAB — COMPREHENSIVE METABOLIC PANEL
ALT: 47 U/L — ABNORMAL HIGH (ref 0–44)
AST: 26 U/L (ref 15–41)
Albumin: 2.8 g/dL — ABNORMAL LOW (ref 3.5–5.0)
Alkaline Phosphatase: 65 U/L (ref 38–126)
Anion gap: 8 (ref 5–15)
BUN: 14 mg/dL (ref 8–23)
CO2: 25 mmol/L (ref 22–32)
Calcium: 8.4 mg/dL — ABNORMAL LOW (ref 8.9–10.3)
Chloride: 106 mmol/L (ref 98–111)
Creatinine, Ser: 0.43 mg/dL — ABNORMAL LOW (ref 0.44–1.00)
GFR, Estimated: >60 mL/min (ref >60)
Glucose, Bld: 100 mg/dL — ABNORMAL HIGH (ref 70–99)
Potassium: 3.8 mmol/L (ref 3.5–5.1)
Sodium: 139 mmol/L (ref 135–145)
Total Bilirubin: 0.8 mg/dL (ref 0.3–1.2)
Total Protein: 5.1 g/dL — ABNORMAL LOW (ref 6.5–8.1)

## 2021-06-25 NOTE — Progress Notes (Signed)
Physical Therapy Session Note  Patient Details  Name: Tara Murphy MRN: [id number] Date of Birth: Nov 01, 1945  Today's Date: 06/25/2021 PT Individual Time: 1300-1415 PT Individual Time Calculation (min): 75 min   Short Term Goals: Week 1:  PT Short Term Goal 1 (Week 1): =LTGs d/t ELOS  Skilled Therapeutic Interventions/Progress Updates:    Pt received in recliner and agreeable to therapy.  No complaint of pain. Pt doffed shoes and donned shoes with AFO modI. No teds donned on arrival, noted edema in LLE. Gait to therapy gym x ~100 ft with RW and supervision. Pt demoes mild toe drag with AFO, continues to demo L hip instability and difficulty advancing LLE, mildly improved than no AFO. Trialled with DF asisst ace wrap. Pt prefers the feeling of assist over rigid brace. Education on appropriate use of brace, ie long distances, difficult terrain, and that DF assist wrap helpful during sessions to promote active DF but likely not long term solution. Session focused on high intensity gait training with lite gait for improved pt safety and confidence and improved tactile cueing. Treadmill at .6-.8 mph throughout. Therapist provided assist for normalized gait mechanics on LLE and verbal cues for RLE. Gait x 150 ft, x 240, x 70 ft, x 181 ft. Removed DF assist for walk back to room, pt demoed improved swing with LLE and mild improvement in DF/heel strike. Pt returned to recliner after session and was left with all needs in reach and alarm active.   Therapy Documentation Precautions:  Precautions Precautions: Other (comment) (fall risk, mild L hemi (LE>UE)) Precaution Comments: left foot drop Required Braces or Orthoses: Other Brace Other Brace: L AFO, not using since previous admission Restrictions Weight Bearing Restrictions: No    Therapy/Group: Individual Therapy  Mickel Fuchs 06/25/2021, 4:26 PM

## 2021-06-25 NOTE — Progress Notes (Signed)
PROGRESS NOTE   Subjective/Complaints:   Pt reports doing well- working with Therapy again today- goal to d/c by Friday.   Asking about Decadron- it was reduced by CT surgery to 2 mg daily as of Saturday AM-  For 7 days.   LBM yesterday- had a good BM.   ROS:  Pt denies SOB, abd pain, CP, N/V/C/D, and vision changes    Objective:   No results found. Recent Labs    06/25/21 0538  WBC 8.6  HGB 10.7*  HCT 31.6*  PLT 260   Recent Labs    06/25/21 0538  NA 139  K 3.8  CL 106  CO2 25  GLUCOSE 100*  BUN 14  CREATININE 0.43*  CALCIUM 8.4*    Intake/Output Summary (Last 24 hours) at 06/25/2021 1025 Last data filed at 06/25/2021 0737 Gross per 24 hour  Intake 960 ml  Output --  Net 960 ml        Physical Exam: Vital Signs Blood pressure 120/65, pulse 67, temperature (!) 97.5 F (36.4 C), temperature source Oral, resp. rate 17, height 5' 6.75" (1.695 m), weight 93.9 kg, SpO2 95 %.    General: awake, alert, appropriate, sitting up in bed; bright affect; NAD HENT: conjugate gaze; oropharynx moist CV: regular rate; no JVD Pulmonary: decreased on L side due to lobectomy;  GI: soft, NT, ND, (+)BS Psychiatric: appropriate; brighter affect Neurological: Ox3     Cervical back: Normal range of motion and neck supple.     Comments: LE edema No tenderness in extremities Left chest wall tenderness with purple bruising -  Skin:    General: Skin is warm and dry.   Neurological:     Mental Status: She is alert and oriented to person, place, and time.     Comments: Alert Motor: B/l UE 5/5 proximal to distal RLE: 5/5 proximal to distal LLE: HF 2-/5, KE 2-5, ADF 0/5   Assessment/Plan: 1. Functional deficits which require 3+ hours per day of interdisciplinary therapy in a comprehensive inpatient rehab setting. Physiatrist is providing close team supervision and 24 hour management of active medical problems  listed below. Physiatrist and rehab team continue to assess barriers to discharge/monitor patient progress toward functional and medical goals  Care Tool:  Bathing    Body parts bathed by patient: Face, Right arm, Left arm, Chest, Abdomen, Front perineal area, Right upper leg, Left upper leg, Right lower leg, Left lower leg, Buttocks         Bathing assist Assist Level: Set up assist     Upper Body Dressing/Undressing Upper body dressing   What is the patient wearing?: Bra, Pull over shirt    Upper body assist Assist Level: Set up assist    Lower Body Dressing/Undressing Lower body dressing      What is the patient wearing?: Pants, Underwear/pull up     Lower body assist Assist for lower body dressing: Supervision/Verbal cueing     Toileting Toileting    Toileting assist Assist for toileting: Supervision/Verbal cueing     Transfers Chair/bed transfer  Transfers assist     Chair/bed transfer assist level: Supervision/Verbal cueing     Locomotion Ambulation  Ambulation assist      Assist level: Contact Guard/Touching assist Assistive device: Walker-rolling Max distance: 10   Walk 10 feet activity   Assist     Assist level: Contact Guard/Touching assist Assistive device: Walker-rolling   Walk 50 feet activity   Assist    Assist level: Contact Guard/Touching assist Assistive device: Walker-rolling    Walk 150 feet activity   Assist Walk 150 feet activity did not occur: Safety/medical concerns         Walk 10 feet on uneven surface  activity   Assist Walk 10 feet on uneven surfaces activity did not occur: Safety/medical concerns         Wheelchair     Assist Is the patient using a wheelchair?: Yes Type of Wheelchair: Manual (transport only)    Wheelchair assist level: Dependent - Patient 0%      Wheelchair 50 feet with 2 turns activity    Assist        Assist Level: Dependent - Patient 0%   Wheelchair  150 feet activity     Assist      Assist Level: Dependent - Patient 0%   Blood pressure 120/65, pulse 67, temperature (!) 97.5 F (36.4 C), temperature source Oral, resp. rate 17, height 5' 6.75" (1.695 m), weight 93.9 kg, SpO2 95 %.    Medical Problem List and Plan: 1.  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 .              -patient may shower if incision covered             -ELOS/Goals: Supervision/Mod I 6-9 days.             con't PT and OT- will determine length of stay on Tuesday at team conference.  2.  Antithrombotics: -DVT/anticoagulation:  Pharmaceutical: Lovenox             -antiplatelet therapy:  3. Pain Management: Gabapentin 600 mg/hs             --d/c Tramadol due to history of seizure episode.   9/29- has oxy 5-10 mg q4 hours prn for severe pain- con't regimen- no Tramadol!  10/3- pain controlled- con't regimen             Monitor with increased exertion, pain control is good 4. Mood: LCSW to follow for evaluation and support.              -antipsychotic agents: N/A 5. Neuropsych: This patient is capable of making decisions on her own behalf. 6. Skin/Wound Care: Monitor incision for healing.  7. Fluids/Electrolytes/Nutrition: Monitor I/Os CMP ordered for tomorrow AM  9/29- ALT up slightly at 55- will recheck Monday 8. Metastatic cancer: Continue decadron 2 mg bid  9/30- Path T2A, N0- poorly differentiated nonsmall cell CA- CTS surgery went over path results with pt- leaving message for Oncology to see what plan is for Decadron as well as treatment after d/c?  10/3- Decadron was reduced to 2 mg daily x 7 days, then stop-  9. 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   9/30- sleepy, but slept well- will monitor 10. RLS: Continue Requip hs 11. Constipation  10/3- LBM yesterday- con't regimen    LOS: 5 days A FACE TO FACE  EVALUATION WAS PERFORMED  Tara Murphy 06/25/2021, 10:25 AM

## 2021-06-25 NOTE — Progress Notes (Signed)
Occupational Therapy Session Note  Patient Details  Name: Tara Murphy MRN: [id number] Date of Birth: 1946/09/06  Today's Date: 06/25/2021 OT Individual Time: 1000-1056 and 5883-2549 OT Individual Time Calculation (min): 56 min and 53 min   Short Term Goals: Week 1:  OT Short Term Goal 1 (Week 1): STGs = LTGs at Mod I/Supervision   Skilled Therapeutic Interventions/Progress Updates:    Session 1: Pt greeted at time of session up in recliner agreeable to OT session, no report of pain throughout. Pt wanting to take a shower, already picked out her clothing. Ambulated recliner > bathroom and all transfers to shower and commode with close supervision/CGA with RW. Covered L abdominal wound for waterproofing and UB/LB bathing seated with brief stands with Supervision, also able to utilize figure four for feet. Dried off same manner and UB dress set up and LB dress underwear, pants, and socks with Supervision/CGA. Ambulated back to recliner same as above. Pt requesting further cognitive testing as she feels like cognition has been affected, informally performed SLUMS with score of 25. Discussed with SLP, recommend f/u outpatient after discharging d/t short ELOS, pt agreed. Also discussed with pt regarding her anxiety and how this is impacting her daily life. Up in recliner call bell in reach all needs met.    Session 2: Pt greeted at time of session sitting up in recliner agreeable to OT session. No pain reported. Discussion at beginning of session regarding DC planning, wearing AFO at home for community, and conference tomorrow. ADL needs met at this time. Ambulated short distance to w/c and transport to ortho gym. Focused on dynamic standing balance an airex pad at [facility] for 4:44 for visually scanning for approx 35 words. Note ankle began supinating and pt fatigued, resumed sitting for rest break. Focused on sit <> stands x3 with no AD for challenging balance and improving LE strength, Min A overall  without AD. Back in room, walked short distance to recliner and alarm on call bell in reach.    Therapy Documentation Precautions:  Precautions Precautions: Other (comment) (fall risk, mild L hemi (LE>UE)) Precaution Comments: left foot drop Required Braces or Orthoses: Other Brace Other Brace: L AFO, not using since previous admission Restrictions Weight Bearing Restrictions: No    Therapy/Group: Individual Therapy  Viona Gilmore 06/25/2021, 7:20 AM

## 2021-06-26 ENCOUNTER — Ambulatory Visit: Payer: Self-pay | Admitting: Thoracic Surgery (Cardiothoracic Vascular Surgery)

## 2021-06-26 NOTE — Patient Care Conference (Signed)
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 Courtney Heys Social Worker Present: Ovidio Kin, LCSW Nurse Present: Dorthula Nettles, RN PT Present: Ailene Rud, PT OT Present: Lillia Corporal, OT PPS Coordinator present : Gunnar Fusi, SLP     Current Status/Progress Goal Weekly Team Focus  Bowel/Bladder   continent b/b, lbm 06/25/21  remain continent  assess as needed   Swallow/Nutrition/ Hydration             ADL's   Supervision/CGA for bathing shower level, dressing, and toileting. Functional mobility and ADL transfers CGA/Supervision. all with RW. L foot drop. Anxiety limiting.  upgraded Mod I  ADL retraining, global endurance and conditioning, functional mobility, family and pt ed, DC planning, cognition, L NMR, standing balance   Mobility   transfers with RW supervision, gait up to 200 ft with RW and supervision with DF assist or AFO  mod I transfers, supervision gait and stairs  ant tib activation, safety, gait, endurance   Communication             Safety/Cognition/ Behavioral Observations            Pain   3/10 pain  < 2  assess pain q 4 hr and prn   Skin   left chest incision, head incision clean  no new skin breakdown  assess skin q shift and prn     Discharge Planning:  Home with husband who has back issues but an provide supervision level, daughter also  involved   Team Discussion: Has been refusing Lovenox. Pain is controlled. ALT elevated by decreasing. Continent B/B, LBM 06/24/2021. Reports 3/10 pain. Tylenol, Oxy IR, and Tramadol available. Trazodone for sleep. Incisions are CDI. Educating on medications, skin/wound care. Doing well, at a supervision level. Upgraded goals to Mod I. Largely supervision. Mod I transfers. Left foot drop. Needs to wear AFO. Patient on target to meet rehab goals: yes  *See Care Plan and progress notes for long and short-term goals.   Revisions to Treatment Plan:  MD adjusting medications, therapy upgraded goals.  Teaching Needs: Family education, medication management, pain management, skin/wound care, safety awareness, transfer training, gait training, balance training, endurance training.  Current Barriers to Discharge: Decreased caregiver support, Medical stability, Home enviroment access/layout, Wound care, Weight, Weight bearing restrictions, Medication compliance, and Pending chemo/radiation  Possible Resolutions to Barriers: Pain management addressed. Foot drop addressed. MD addressed refusal of Lovenox. Providing emotional support.     Medical Summary Current Status: s/p lobectomy L side; pain 3/10- incision looks good- glued; continent of B/B  Barriers to Discharge: Decreased family/caregiver support;Home enviroment access/layout;Pending chemo/radiation;Medical stability;Weight;Wound care;Other (comments);Weight bearing restrictions  Barriers to Discharge Comments: weaning decardron- at Supervision level- L foot drop- Possible Resolutions to [employer] Focus: focus- wean decadron; stil has  drop foot- has previous AFO- poor compliance- d/c 06/29/21  Continued Need for Acute Rehabilitation Level of Care: The patient requires daily medical management by a physician with specialized training in physical medicine and rehabilitation for the following reasons: Direction of a multidisciplinary  physical rehabilitation program to maximize functional independence : Yes Medical management of patient stability for increased activity during participation in an intensive rehabilitation regime.: Yes Analysis of laboratory values and/or radiology reports with any subsequent need for medication adjustment and/or medical intervention. : Yes   I attest that I was present, lead the team conference, and concur with the assessment and plan of the team.   Cristi Loron 06/26/2021, 6:20 PM

## 2021-06-26 NOTE — Progress Notes (Signed)
Occupational Therapy Session Note  Patient Details  Name: Tara Murphy MRN: [id number] Date of Birth: 12-10-1945  Today's Date: 06/26/2021 OT Individual Time: 7076-1518 and 3437-3578 OT Individual Time Calculation (min): 56 min and 29 min   Short Term Goals: Week 1:  OT Short Term Goal 1 (Week 1): STGs = LTGs at Mod I/Supervision   Skilled Therapeutic Interventions/Progress Updates:    Session 1: Pt greeted at time of session sitting up in recliner agreeable to OT session, no pain and wanting to wait for afternoon session for shower. Ambulated recliner > bathroom Supervision with RW and 3/3 toileting tasks same manner. Pt ambulated bathroom > sink for hygiene and walked approx 200+ feet to ADL apartment. Note declined wearing AFO and noted foot drop but able to compensate at hip. In ADL apartment, sofa transfer with Supervision but Min/Mod to stand from low soft surface. Walk in shower transfer with Supervision/CGA with simulated placement of grab bars at home and home set up. IADL retraining in kitchen for retrieving items from fridge at various heights/levels and simulating using bag at home for personal items. Back in room, ambulated to recliner, alarm on call bell in reach.   Session 2: Pt greeted at time of session sitting up inrecliner finishing up vitals with NT, no pain, wanting to shower. Assisted with footwear 2/2 time constraints. Ambulated recliner > bathroom Supervision and transferred to shower bench same manner. After covering incision, UB/LB bathing with set up seated on bench. UB/LB dressing with set up assist as well after drying off seated. Ambulated back to recliner Supervision and husband present at this time. Having questions about AFO, OT recommended importance of wearing to prevent falls, but recommended speak with PT with further questions. Call bell in reach all needs met.   Therapy Documentation Precautions:  Precautions Precautions: Other (comment) (fall risk, mild L  hemi (LE>UE)) Precaution Comments: left foot drop Required Braces or Orthoses: Other Brace Other Brace: L AFO, not using since previous admission Restrictions Weight Bearing Restrictions: No    Therapy/Group: Individual Therapy  Viona Gilmore 06/26/2021, 7:15 AM

## 2021-06-26 NOTE — Progress Notes (Signed)
Patient ID: Tara Murphy, female   DOB: 12/29/1945, 75 y.o.   MRN: [id number]  Met with pt to update regarding team conference goals of mod/I level and discharge 10/7. She wants to go to [facility] since it is so close to her home. Discussed they do not have SP there she feels this is not needed. Has all equipment from previous admits and feels prepared to go home on Friday. 

## 2021-06-26 NOTE — Progress Notes (Signed)
PROGRESS NOTE   Subjective/Complaints:  Pt reports worried that won't be discharged by Friday- explained it will likely be Thursday/Friday- walking >534ft with RW per pt.  Balance is her main issue now.  Stopped using lovenox- refusing it.  Doesn't want it anymore.   ROS:  Pt denies SOB, abd pain, CP, N/V/C/D, and vision changes    Objective:   No results found. Recent Labs    06/25/21 0538  WBC 8.6  HGB 10.7*  HCT 31.6*  PLT 260   Recent Labs    06/25/21 0538  NA 139  K 3.8  CL 106  CO2 25  GLUCOSE 100*  BUN 14  CREATININE 0.43*  CALCIUM 8.4*    Intake/Output Summary (Last 24 hours) at 06/26/2021 0859 Last data filed at 06/25/2021 1809 Gross per 24 hour  Intake 540 ml  Output --  Net 540 ml        Physical Exam: Vital Signs Blood pressure (!) 114/57, pulse 72, temperature (!) 97.5 F (36.4 C), temperature source Oral, resp. rate 18, height 5' 6.75" (1.695 m), weight 93.9 kg, SpO2 95 %.     General: awake, alert, appropriate, sitting up in bedside chair; NAD HENT: conjugate gaze; oropharynx moist CV: regular rate; no JVD Pulmonary: CTA B/L; no W/R/R- good air movement- L lateral chest incision glued- a little irritated, but no drainage, no erythema.  GI: soft, NT, ND, (+)BS Psychiatric: appropriate Neurological: Ox3   Cervical back: Normal range of motion and neck supple.     Comments: LE edema No tenderness in extremities Left chest wall tenderness with purple bruising - bruising healing well on chest and abdomen Skin:    General: Skin is warm and dry.   Neurological:     Mental Status: She is alert and oriented to person, place, and time.     Comments: Alert Motor: B/l UE 5/5 proximal to distal RLE: 5/5 proximal to distal LLE: HF 2-/5, KE 2-5, ADF 0/5   Assessment/Plan: 1. Functional deficits which require 3+ hours per day of interdisciplinary therapy in a comprehensive inpatient  rehab setting. Physiatrist is providing close team supervision and 24 hour management of active medical problems listed below. Physiatrist and rehab team continue to assess barriers to discharge/monitor patient progress toward functional and medical goals  Care Tool:  Bathing    Body parts bathed by patient: Face, Right arm, Left arm, Chest, Abdomen, Front perineal area, Right upper leg, Left upper leg, Right lower leg, Left lower leg, Buttocks         Bathing assist Assist Level: Set up assist     Upper Body Dressing/Undressing Upper body dressing   What is the patient wearing?: Bra, Pull over shirt    Upper body assist Assist Level: Set up assist    Lower Body Dressing/Undressing Lower body dressing      What is the patient wearing?: Pants, Underwear/pull up     Lower body assist Assist for lower body dressing: Supervision/Verbal cueing     Toileting Toileting    Toileting assist Assist for toileting: Supervision/Verbal cueing     Transfers Chair/bed transfer  Transfers assist     Chair/bed transfer assist  level: Supervision/Verbal cueing     Locomotion Ambulation   Ambulation assist      Assist level: Contact Guard/Touching assist Assistive device: Walker-rolling Max distance: 10   Walk 10 feet activity   Assist     Assist level: Contact Guard/Touching assist Assistive device: Walker-rolling   Walk 50 feet activity   Assist    Assist level: Contact Guard/Touching assist Assistive device: Walker-rolling    Walk 150 feet activity   Assist Walk 150 feet activity did not occur: Safety/medical concerns         Walk 10 feet on uneven surface  activity   Assist Walk 10 feet on uneven surfaces activity did not occur: Safety/medical concerns         Wheelchair     Assist Is the patient using a wheelchair?: Yes Type of Wheelchair: Manual (transport only)    Wheelchair assist level: Dependent - Patient 0%       Wheelchair 50 feet with 2 turns activity    Assist        Assist Level: Dependent - Patient 0%   Wheelchair 150 feet activity     Assist      Assist Level: Dependent - Patient 0%   Blood pressure (!) 114/57, pulse 72, temperature (!) 97.5 F (36.4 C), temperature source Oral, resp. rate 18, height 5' 6.75" (1.695 m), weight 93.9 kg, SpO2 95 %.    Medical Problem List and Plan: 1.  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 .              -patient may shower if incision covered             -ELOS/Goals: Supervision/Mod I 6-9 days.             con't PT and OT- team conference today to determine d/c date.  2.  Antithrombotics: -DVT/anticoagulation:  Pharmaceutical: Lovenox  10/4- pt refusing lovenox- don't want to stop- but she has right to refuse it.              -antiplatelet therapy:  3. Pain Management: Gabapentin 600 mg/hs             --d/c Tramadol due to history of seizure episode.   9/29- has oxy 5-10 mg q4 hours prn for severe pain- con't regimen- no Tramadol!  10/3- pain controlled- con't regimen             Monitor with increased exertion, pain control is good 4. Mood: LCSW to follow for evaluation and support.              -antipsychotic agents: N/A 5. Neuropsych: This patient is capable of making decisions on her own behalf. 6. Skin/Wound Care: Monitor incision for healing.   10/4- skin healing well- con't regimen 7. Fluids/Electrolytes/Nutrition: Monitor I/Os CMP ordered for tomorrow AM  9/29- ALT up slightly at 55- will recheck Monday  10/4- ALT down to 47- con't to monitor.  8. Metastatic cancer: Continue decadron 2 mg bid  9/30- Path T2A, N0- poorly differentiated nonsmall cell CA- CTS surgery went over path results with pt- leaving message for Oncology to see what plan is for Decadron as well as treatment after d/c?  10/3- Decadron was  reduced to 2 mg daily x 7 days, then stop-  9. 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   9/30- sleepy, but slept  well- will monitor 10. RLS: Continue Requip hs 11. Constipation  10/3- LBM yesterday- con't regimen    LOS: 6 days A FACE TO FACE EVALUATION WAS PERFORMED  Tara Murphy 06/26/2021, 8:59 AM

## 2021-06-26 NOTE — Progress Notes (Signed)
Physical Therapy Session Note  Patient Details  Name: Tara Murphy MRN: [id number] Date of Birth: 1946-05-03  Today's Date: 06/26/2021 PT Individual Time: 1300-1358 PT Individual Time Calculation (min): 58 min   Short Term Goals: Week 1:  PT Short Term Goal 1 (Week 1): =LTGs d/t ELOS  Skilled Therapeutic Interventions/Progress Updates:    Pt received in recliner and agreeable to therapy.  No complaint of pain. Donned DF assist ace wrap with tot A. Sit to stand with supervision throughout session, much improved after previous PT session with focus on Sit to stand.   Gait to/from therapy gym with RW and supervision. VC for incr heel strike and L stance time, incr speed for normalization of gait pattern. Pt demoed initial step to pattern, quickly becoming step through with decr stance time on L. Pt demoed incr trendelenberg gait after therex.  Pt performed the following exercises to promote LE strength and endurance:  Static single leg stance with visual feedback from mirror and BUE support. 7 x 20 sec Step up on L leg with R knee drive, 4 x 6 Squats with RTB around knees for proprioceptive input, 3 x 10  Pt returned to room after session, requiring frequent cueing for heel strike and step length d/t fatigue. Pt returned to recliner and was left with all needs in reach and alarm active.    Therapy Documentation Precautions:  Precautions Precautions: Other (comment) (fall risk, mild L hemi (LE>UE)) Precaution Comments: left foot drop Required Braces or Orthoses: Other Brace Other Brace: L AFO, not using since previous admission Restrictions Weight Bearing Restrictions: No     Therapy/Group: Individual Therapy  Mickel Fuchs 06/26/2021, 2:34 PM

## 2021-06-26 NOTE — Significant Event (Signed)
Pt refused lovenox injection this AM. Pt educated on medication and usage. Pt verbalizes understanding and continues to refuse. PA notified  Sheela Stack, LPN

## 2021-06-26 NOTE — Progress Notes (Signed)
Physical Therapy Session Note  Patient Details  Name: Tara Murphy MRN: [id number] Date of Birth: Oct 10, 1945  Today's Date: 06/26/2021 PT Individual Time: 1030-1130 PT Individual Time Calculation (min): 60 min   Short Term Goals: Week 1:  PT Short Term Goal 1 (Week 1): =LTGs d/t ELOS  Skilled Therapeutic Interventions/Progress Updates:  Pt resting in recliner.  She denied pain.  PT noted minimal edema pt's LLE.  Educated pt about benefits of reducing edema via TEDS, in light of weakness LLE.  Pt voiced understanding.  PT donned teds and shoes.  Sit> stand with mod assist for low level of recliner seat.   Transfer training for sit>< stand emphasizing forward wt shift, foot position.  Sit<> stand with CGa, slowly x 2, with use of UES.  On room air, O2 sats 97%, HR 85.  neuromuscular re-education via demo, multimodal cues for seated: L ankle DF; reciprocal scooting forward/backward without use of UEs.  Supine- bil bridging with adductor squeeze, focusing on symmetrical raising of hips; cervical flexion for core activation.  PT donned ACE wrap LLE for foot drop.  Gait training on level tile with RW, x 10', x 15' , x 5' with turns. Bed mobility training on firm mat, with sit> supine iwht min assist to R, with supervision and extra time to L.  Pt stated that she will switch sides of the bed at home, at dc. Supine> sit with supervision, if pt rolls L , then flexes hips before kicking feet of of mat.  At end of session, pt seated in recliner.  PT discussed falls risk; pt stated that she would not get up without staff.       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

## 2021-06-27 NOTE — Progress Notes (Signed)
Occupational Therapy Discharge Summary  Patient Details  Name: Tara Murphy MRN: [id number] Date of Birth: April 02, 1946   Patient has met 10 of 10 long term goals due to improved activity tolerance, improved balance, postural control, ability to compensate for deficits, improved awareness, and improved coordination.  Patient to discharge at overall Modified Independent level.  Patient's care partner is independent to provide the necessary physical assistance at discharge.  Pt is Mod I with BADLs including bathing, dressing, and toileting with RW but does need assist with IADL tasks. L foot drop present 2/2 previous surgery, has AFO but declines wearing. Husband and children will be present for 24/7 help as needed.   Reasons goals not met: NA  Recommendation:  No OT follow up, pt will follow up with PT for LE weakness/ROM deficits.   Equipment: No equipment provided Pt has all DME from previous CIR stay  Reasons for discharge: treatment goals met and discharge from hospital  Patient/family agrees with progress made and goals achieved: Yes  OT Discharge Precautions/Restrictions  Precautions Precautions: Fall Precaution Comments: left foot drop Required Braces or Orthoses: Other Brace Other Brace: L AFO, not using since previous admission Restrictions Weight Bearing Restrictions: No Pain Pain Assessment Pain Scale: 0-10 Pain Score: 0-No pain ADL ADL Eating: Independent Grooming: Modified independent Upper Body Bathing: Setup Lower Body Bathing: Setup Upper Body Dressing: Modified independent (Device) Lower Body Dressing: Modified independent Toileting: Modified independent Toilet Transfer: Modified independent Armed forces technical officer Method: Magazine features editor: Modified independent Vision Baseline Vision/History: 0 No visual deficits Patient Visual Report: No change from baseline Vision Assessment?: No apparent visual deficits Perception  Perception: Within  Functional Limits Praxis Praxis: Intact Cognition Arousal/Alertness: Awake/alert Year: 2022 Month: October Day of Week: Correct Memory: Appears intact Immediate Memory Recall: Sock;Bed;Blue Memory Recall Sock: Without Cue Memory Recall Blue: Without Cue Memory Recall Bed: Without Cue Sensation Coordination Gross Motor Movements are Fluid and Coordinated: No Fine Motor Movements are Fluid and Coordinated: Yes Coordination and Movement Description: 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 Finger Nose Finger Test: [facility] Motor  Motor Motor: Hemiplegia Motor - Skilled Clinical Observations: mild L hemiparesis (LE>UE) Mobility  Transfers Sit to Stand: Independent with assistive device Stand to Sit: Independent with assistive device  Trunk/Postural Assessment  Cervical Assessment Cervical Assessment: Within Functional Limits Thoracic Assessment Thoracic Assessment: Within Functional Limits Lumbar Assessment Lumbar Assessment: Within Functional Limits Postural Control Postural Control: Within Functional Limits  Balance Balance Balance Assessed: Yes Static Sitting Balance Static Sitting - Balance Support: Feet supported;Bilateral upper extremity supported Static Sitting - Level of Assistance: 6: Modified independent (Device/Increase time) Dynamic Sitting Balance Dynamic Sitting - Balance Support: Feet supported;No upper extremity supported Dynamic Sitting - Level of Assistance: 6: Modified independent (Device/Increase time) Static Standing Balance Static Standing - Balance Support: Bilateral upper extremity supported Static Standing - Level of Assistance: 6: Modified independent (Device/Increase time) Dynamic Standing Balance Dynamic Standing - Balance Support: During functional activity Dynamic Standing - Level of Assistance: 6: Modified independent (Device/Increase time) Extremity/Trunk Assessment RUE Assessment RUE Assessment: Within Functional Limits LUE Assessment LUE Assessment:  Within Functional Limits   Viona Gilmore 06/27/2021, 9:38 AM

## 2021-06-27 NOTE — Progress Notes (Signed)
PROGRESS NOTE   Subjective/Complaints:  Pt reports has new bruise on inner L arm- "happy stopped lovenox:.   No issues- ready to do OT with therapy.   ROS:  Pt denies SOB, abd pain, CP, N/V/C/D, and vision changes   Objective:   No results found. Recent Labs    06/25/21 0538  WBC 8.6  HGB 10.7*  HCT 31.6*  PLT 260   Recent Labs    06/25/21 0538  NA 139  K 3.8  CL 106  CO2 25  GLUCOSE 100*  BUN 14  CREATININE 0.43*  CALCIUM 8.4*    Intake/Output Summary (Last 24 hours) at 06/27/2021 1444 Last data filed at 06/27/2021 1248 Gross per 24 hour  Intake 660 ml  Output --  Net 660 ml        Physical Exam: Vital Signs Blood pressure (!) 141/66, pulse 90, temperature 98.1 F (36.7 C), resp. rate 18, height 5' 6.75" (1.695 m), weight 93.9 kg, SpO2 95 %.       General: awake, alert, appropriate, NAD HENT: conjugate gaze; oropharynx moist CV: regular rate; no JVD Pulmonary: decreased on L side due to lobectomy- incision looks good- glued GI: soft, NT, ND, (+)BS Psychiatric: appropriate Neurological: Ox3    Cervical back: Normal range of motion and neck supple.     Comments: LE edema No tenderness in extremities Left chest wall tenderness with purple bruising - bruising healing well on chest and abdomen Skin:    General: Skin is warm and dry.   Neurological:     Mental Status: She is alert and oriented to person, place, and time.     Comments: Alert Motor: B/l UE 5/5 proximal to distal RLE: 5/5 proximal to distal LLE: HF 2-/5, KE 2-5, ADF 0/5   Assessment/Plan: 1. Functional deficits which require 3+ hours per day of interdisciplinary therapy in a comprehensive inpatient rehab setting. Physiatrist is providing close team supervision and 24 hour management of active medical problems listed below. Physiatrist and rehab team continue to assess barriers to discharge/monitor patient progress toward  functional and medical goals  Care Tool:  Bathing    Body parts bathed by patient: Face, Right arm, Left arm, Chest, Abdomen, Front perineal area, Right upper leg, Left upper leg, Right lower leg, Left lower leg, Buttocks         Bathing assist Assist Level: Set up assist     Upper Body Dressing/Undressing Upper body dressing   What is the patient wearing?: Bra, Pull over shirt    Upper body assist Assist Level: Set up assist    Lower Body Dressing/Undressing Lower body dressing      What is the patient wearing?: Pants, Underwear/pull up     Lower body assist Assist for lower body dressing: Set up assist     Toileting Toileting    Toileting assist Assist for toileting: Supervision/Verbal cueing     Transfers Chair/bed transfer  Transfers assist     Chair/bed transfer assist level: Contact Guard/Touching assist     Locomotion Ambulation   Ambulation assist      Assist level: Contact Guard/Touching assist Assistive device: Walker-rolling Max distance: 50   Walk  10 feet activity   Assist     Assist level: Supervision/Verbal cueing Assistive device: Walker-rolling   Walk 50 feet activity   Assist    Assist level: Supervision/Verbal cueing Assistive device: Walker-rolling    Walk 150 feet activity   Assist Walk 150 feet activity did not occur: Safety/medical concerns         Walk 10 feet on uneven surface  activity   Assist Walk 10 feet on uneven surfaces activity did not occur: Safety/medical concerns         Wheelchair     Assist Is the patient using a wheelchair?: Yes Type of Wheelchair: Manual (transport only)    Wheelchair assist level: Dependent - Patient 0%      Wheelchair 50 feet with 2 turns activity    Assist        Assist Level: Dependent - Patient 0%   Wheelchair 150 feet activity     Assist      Assist Level: Dependent - Patient 0%   Blood pressure (!) 141/66, pulse 90, temperature  98.1 F (36.7 C), resp. rate 18, height 5' 6.75" (1.695 m), weight 93.9 kg, SpO2 95 %.    Medical Problem List and Plan: 1.  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 .              -patient may shower if incision covered             -ELOS/Goals: Supervision/Mod I 6-9 days.             con't PT and OT- d/c 06/29/21 2.  Antithrombotics: -DVT/anticoagulation:  Pharmaceutical: Lovenox  10/4- pt refusing lovenox- don't want to stop- but she has right to refuse it.              -antiplatelet therapy:  3. Pain Management: Gabapentin 600 mg/hs             --d/c Tramadol due to history of seizure episode.   9/29- has oxy 5-10 mg q4 hours prn for severe pain- con't regimen- no Tramadol!  10/5- pain controlled- con't regimen             Monitor with increased exertion, pain control is good 4. Mood: LCSW to follow for evaluation and support.              -antipsychotic agents: N/A 5. Neuropsych: This patient is capable of making decisions on her own behalf. 6. Skin/Wound Care: Monitor incision for healing.   10/4- skin healing well- con't regimen 7. Fluids/Electrolytes/Nutrition: Monitor I/Os CMP ordered for tomorrow AM  9/29- ALT up slightly at 55- will recheck Monday  10/4- ALT down to 47- con't to monitor.  8. Metastatic cancer: Continue decadron 2 mg bid  9/30- Path T2A, N0- poorly differentiated nonsmall cell CA- CTS surgery went over path results with pt- leaving message for Oncology to see what plan is for Decadron as well as treatment after d/c?  10/3- Decadron was reduced to 2 mg daily x 7 days, then stop-   10/5- verified Decadron last day is Friday-  9. 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   9/30- sleepy, but slept well- will monitor 10. RLS: Continue Requip hs 11. Constipation  10/3- LBM yesterday- con't regimen    LOS: 7 days A FACE TO  FACE EVALUATION WAS PERFORMED  Tara Murphy 06/27/2021, 2:44 PM

## 2021-06-27 NOTE — Progress Notes (Signed)
Recreational Therapy Session Note  Patient Details  Name: Dalphine Cowie MRN: [id number] Date of Birth: [birth year] Today's Date: 06/27/2021  Pain: no c/o  Pt participated in animal assisted activity seated in recliner with supervision.  Pt stated how much she enjoyed this visit, petting the therapy dog and conversing with pet partner team.    Laketra Bowdish 06/27/2021, 12:07 PM

## 2021-06-27 NOTE — Progress Notes (Signed)
Occupational Therapy Session Note  Patient Details  Name: Tara Murphy MRN: [id number] Date of Birth: [birth year]  Today's Date: 06/27/2021 OT Individual Time: 8875-7972 OT Individual Time Calculation (min): 54 min    Short Term Goals: Week 1:  OT Short Term Goal 1 (Week 1): STGs = LTGs at Mod I/Supervision  Skilled Therapeutic Interventions/Progress Updates:    Pt greeted at time of session sitting up in recliner agreeable to OT session, no pain reported. Politely declined shower this am, wanting to perform later this evening with nursing staff and focus on endurance and coordination this morning. Ambulated room <> main gym with RW and Supervision. Standing at mat, pt performed several rounds of [place] activities for LLE hitting various colored dots in sequences up to four at a time to improve short term recall as well. Also in single leg stance on LLE with UE support on AD to use RLE in same manner, strengthening LLE. Pt progressing to performing this task with RUE support on chair back and therapist supporting LUE for dynamic standing challenge. Discussing DC planning throughout session. Walked back to room same as above. Up in recliner all needs met. Discussed with NT as well regarding showering after therapies are done and in agreement.    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

## 2021-06-27 NOTE — Progress Notes (Signed)
Physical Therapy Session Note  Patient Details  Name: Tara Murphy MRN: [id number] Date of Birth: [birth year]  Today's Date: 06/27/2021 PT Individual Time: 0900-0955 PT Individual Time Calculation (min): 55 min   Short Term Goals: Week 1:  PT Short Term Goal 1 (Week 1): =LTGs d/t ELOS  Skilled Therapeutic Interventions/Progress Updates:    Pt received in recliner and agreeable to therapy.  No complaint of pain.   Pt ambulated to/from day room with RW and supervision. Pt continues to demonstrate trendelenburg gait on L side and poor heel strike BIL. Cues for visualization of kicking a ball to improve heel strike and step length with mod improvement.   NMR: Pt utilized kinetron in standing with BUE support 6 x 1 min with seated rest breaks, 60 cm/sec, to promote reciprocal motion and BIL WB. Pt then performed Sit to stand on kinetron pedals to promote equal WB BIL, with visual feed back for knee tracking, x 3.  Pt returned to room after extended rest break and remained in recliner. Pt was left with all needs in reach and alarm active.    Therapy Documentation Precautions:  Precautions Precautions: Fall Precaution Comments: left foot drop Required Braces or Orthoses: Other Brace Other Brace: L AFO, not using since previous admission Restrictions Weight Bearing Restrictions: No   Therapy/Group: Individual Therapy  Mickel Fuchs 06/27/2021, 10:57 AM

## 2021-06-27 NOTE — Progress Notes (Signed)
Physical Therapy Session Note  Patient Details  Name: Tara Murphy MRN: [id number] Date of Birth: 03/05/1946  Today's Date: 06/27/2021 PT Amount of Missed Time (min): 30 Minutes PT Missed Treatment Reason: Patient fatigue    Short Term Goals: Week 1:  PT Short Term Goal 1 (Week 1): =LTGs d/t ELOS   Skilled Therapeutic Interventions/Progress Updates:  Attempt to see pt to make up missed minutes from missed session earlier this day with OT. Pt fatigued from just returning from PT session and preferring to rest at end of day. Time at end of therapist's day and will need to be made up at future date this week when possible.    Therapy Documentation Precautions:  Precautions Precautions: Fall Precaution Comments: left foot drop Required Braces or Orthoses: Other Brace Other Brace: L AFO, not using since previous admission Restrictions Weight Bearing Restrictions: No  Pain:  No pain complaint from pt.  Therapy/Group: Individual Therapy  Alger Simons PT, DPT 06/27/2021, 7:02 PM

## 2021-06-27 NOTE — Progress Notes (Signed)
Physical Therapy Session Note  Patient Details  Name: Tara Murphy MRN: [id number] Date of Birth: [birth year]  Today's Date: 06/27/2021 PT Individual Time: 1415-1456 PT Individual Time Calculation (min): 41 min   Short Term Goals: Week 1:  PT Short Term Goal 1 (Week 1): =LTGs d/t ELOS  Skilled Therapeutic Interventions/Progress Updates:     Pt seated in recliner to start session and is agreeable to PT tx - denies pain. She requested to go downstairs to gift shop to purchase therapist gifts and to [facility] for a coffee. She donned her L AFO with setupA and required minA for sliding her heel into the back of shoe. She completes sit<>stand transfers to [facility] with supervision and ambulates short distance in her room with supervision and RW. Transported downstairs via w/c transport to [employer]. Ambulates throughout atrium lobby, gift shop, and [employer] with close supervision and RW. Verbal and visual cues for increasing L heel strike and L step length due to intermittent L toe drag. She was able to manage money appropriately while buying gifts and was able to stand for brief periods of time without UE support while managing gifts. She required a seated rest break after ordering her coffee and needed to sit in w/c to carry coffee - educated on AD's such as cup holder that could be self purchased to allow ability to transport drinks while ambulating and pt appreciative. Also spent time discussing home safety and general DC planning throughout session. She aws returned upstairs to her room, assisted back to her reliner via ambulatory transfer with RW and supervision. All needs met.   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

## 2021-06-28 NOTE — Progress Notes (Signed)
Inpatient Rehabilitation Medication Review by a Pharmacist  A complete drug regimen review was completed for this patient to identify any potential clinically significant medication issues.  High Risk Drug Classes Is patient taking? Indication by Medication  Antipsychotic Yes Anxiety prn  Anticoagulant Yes VTE ppx  Antibiotic No   Opioid Yes pain  Antiplatelet No   Hypoglycemics/insulin No   Vasoactive Medication No   Chemotherapy No   Other No      Type of Medication Issue Identified Description of Issue Recommendation(s)  Drug Interaction(s) (clinically significant)     Duplicate Therapy     Allergy     No Medication Administration End Date     Incorrect Dose     Additional Drug Therapy Needed     Significant med changes from prior encounter (inform family/care partners about these prior to discharge).    Other       Clinically significant medication issues were identified that warrant physician communication and completion of prescribed/recommended actions by midnight of the next day:  No   Time spent performing this drug regimen review (minutes):  [address], PharmD, [place], AAHIVP, CPP Infectious Disease Pharmacist 06/28/2021 10:15 AM

## 2021-06-28 NOTE — Progress Notes (Signed)
Occupational Therapy Session Note  Patient Details  Name: Tara Murphy MRN: [id number] Date of Birth: [birth year]  Today's Date: 06/28/2021 OT Individual Time: 0804-0900 OT Individual Time Calculation (min): 56 min    Short Term Goals: Week 1:  OT Short Term Goal 1 (Week 1): STGs = LTGs at Mod I/Supervision  Skilled Therapeutic Interventions/Progress Updates:  Session 1: Patient met lying supine in bed in agreement with OT treatment session. 0/10 pain reported at rest and with activity. Grad day session with focus on self-care and therapeutic activity. Patient completed full ADL with Mod I and use of RW. Able to gather ADL items, bathe seated at shower level with Mod I and complete UB/LB dressing with Mod I in sitting/standing. Oral hygiene completed standing at sink level with Mod I. Patient demonstrates good safety awareness with use of RW. Reports use of walker bag at home to free up hands with mobility. Patient anticipating return home with family and continuation of therapy services with neuro OPPT. Reports desire to regain strength in LLE and to walk independently without use of RW someday. Session concluded with patient seated in recliner with call bell within reach. Chair alarm not activated as patient has been made Mod I in room.   Therapy Documentation Precautions:  Precautions Precautions: Fall Precaution Comments: left foot drop Required Braces or Orthoses: Other Brace Other Brace: L AFO, not using since previous admission Restrictions Weight Bearing Restrictions: No General:    Therapy/Group: Individual Therapy  Lynne Righi R [place] 06/28/2021, 6:48 AM

## 2021-06-28 NOTE — Progress Notes (Signed)
PROGRESS NOTE   Subjective/Complaints:  Pt reports no issues- we discussed how to handle going home- and concern for husband memory and w/u might be needed after d/c.    ROS:  Pt denies SOB, abd pain, CP, N/V/C/D, and vision changes   Objective:   No results found. No results for input(s): WBC, HGB, HCT, PLT in the last 72 hours.  No results for input(s): NA, K, CL, CO2, GLUCOSE, BUN, CREATININE, CALCIUM in the last 72 hours.   Intake/Output Summary (Last 24 hours) at 06/28/2021 0919 Last data filed at 06/28/2021 0750 Gross per 24 hour  Intake 840 ml  Output --  Net 840 ml        Physical Exam: Vital Signs Blood pressure 128/64, pulse 78, temperature 98.4 F (36.9 C), resp. rate 18, height 5' 6.75" (1.695 m), weight 93.9 kg, SpO2 96 %.        General: awake, alert, appropriate, sitting up in bed; NAD HENT: conjugate gaze; oropharynx moist CV: regular rate; no JVD Pulmonary: CTA on R_ decreased on L due to lobectomy GI: soft, NT, ND, (+)BS Psychiatric: appropriate Neurological: Ox3    Cervical back: Normal range of motion and neck supple.     Comments: LE edema No tenderness in extremities Left chest wall tenderness with purple bruising - bruising healing well on chest and abdomen Skin:    General: Skin is warm and dry.   Neurological:     Mental Status: She is alert and oriented to person, place, and time.     Comments: Alert Motor: B/l UE 5/5 proximal to distal RLE: 5/5 proximal to distal LLE: HF 2-/5, KE 2-5, ADF 0/5   Assessment/Plan: 1. Functional deficits which require 3+ hours per day of interdisciplinary therapy in a comprehensive inpatient rehab setting. Physiatrist is providing close team supervision and 24 hour management of active medical problems listed below. Physiatrist and rehab team continue to assess barriers to discharge/monitor patient progress toward functional and medical  goals  Care Tool:  Bathing    Body parts bathed by patient: Face, Right arm, Left arm, Chest, Abdomen, Front perineal area, Right upper leg, Left upper leg, Right lower leg, Left lower leg, Buttocks         Bathing assist Assist Level: Independent with assistive device     Upper Body Dressing/Undressing Upper body dressing   What is the patient wearing?: Bra, Pull over shirt    Upper body assist Assist Level: Independent with assistive device    Lower Body Dressing/Undressing Lower body dressing      What is the patient wearing?: Pants, Underwear/pull up     Lower body assist Assist for lower body dressing: Independent with assitive device     Toileting Toileting    Toileting assist Assist for toileting: Independent with assistive device     Transfers Chair/bed transfer  Transfers assist     Chair/bed transfer assist level: Independent with assistive device Chair/bed transfer assistive device: Museum/gallery exhibitions officer assist      Assist level: Contact Guard/Touching assist Assistive device: Walker-rolling Max distance: 50   Walk 10 feet activity   Assist  Assist level: Supervision/Verbal cueing Assistive device: Walker-rolling   Walk 50 feet activity   Assist    Assist level: Supervision/Verbal cueing Assistive device: Walker-rolling    Walk 150 feet activity   Assist Walk 150 feet activity did not occur: Safety/medical concerns         Walk 10 feet on uneven surface  activity   Assist Walk 10 feet on uneven surfaces activity did not occur: Safety/medical concerns         Wheelchair     Assist Is the patient using a wheelchair?: Yes Type of Wheelchair: Manual (transport only)    Wheelchair assist level: Dependent - Patient 0%      Wheelchair 50 feet with 2 turns activity    Assist        Assist Level: Dependent - Patient 0%   Wheelchair 150 feet activity     Assist       Assist Level: Dependent - Patient 0%   Blood pressure 128/64, pulse 78, temperature 98.4 F (36.9 C), resp. rate 18, height 5' 6.75" (1.695 m), weight 93.9 kg, SpO2 96 %.    Medical Problem List and Plan: 1.  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 .              -patient may shower if incision covered             -ELOS/Goals: Supervision/Mod I 6-9 days.             con't PT and OT- d/c 10/7- d/c tomorrow- f/u already on d/c with Dr Ranell Patrick.  2.  Antithrombotics: -DVT/anticoagulation:  Pharmaceutical: Lovenox  10/4- pt refusing lovenox- don't want to stop- but she has right to refuse it.              -antiplatelet therapy:  3. Pain Management: Gabapentin 600 mg/hs             --d/c Tramadol due to history of seizure episode.   9/29- has oxy 5-10 mg q4 hours prn for severe pain- con't regimen- no Tramadol!  10/6- not taking pain meds- con't regimen with tylenol.              Monitor with increased exertion, pain control is good 4. Mood: LCSW to follow for evaluation and support.              -antipsychotic agents: N/A 5. Neuropsych: This patient is capable of making decisions on her own behalf. 6. Skin/Wound Care: Monitor incision for healing.   10/4- skin healing well- con't regimen 7. Fluids/Electrolytes/Nutrition: Monitor I/Os CMP ordered for tomorrow AM  9/29- ALT up slightly at 55- will recheck Monday  10/4- ALT down to 47- con't to monitor.  8. Metastatic cancer: Continue decadron 2 mg bid  9/30- Path T2A, N0- poorly differentiated nonsmall cell CA- CTS surgery went over path results with pt- leaving message for Oncology to see what plan is for Decadron as well as treatment after d/c?  10/3- Decadron was reduced to 2 mg daily x 7 days, then stop-   10/5- verified Decadron last day is Friday-  9. 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    9/30- sleepy, but slept well- will monitor 10. RLS: Continue Requip hs 11. Constipation  10/3- LBM yesterday- con't regimen   10/6- going daily now.   LOS: 8 days A FACE TO FACE  EVALUATION WAS PERFORMED  Daniella Dewberry 06/28/2021, 9:19 AM

## 2021-06-28 NOTE — Evaluation (Signed)
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 [facility])     Past Medical History:      Past Medical History:  Diagnosis Date   Allergy     Arthritis     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[place])      S/p total hip replacement left   Breast cancer ([place]) 2015    left    Cataract     Cholecystolithiasis     Chronic allergic rhinitis     Depression     Former smoker, stopped smoking in distant past 10/05/2019    Quit 2000; 30 y smoking history   GERD (gastroesophageal reflux disease)     Hepatic steatosis 06/08/2020    Intermittent elevated LFTs and ultrasound 2018   Hyperlipemia     IBS (irritable bowel syndrome) 10/08/2018    Nl colonoscopy and EGD 09/2018   IBS (irritable bowel syndrome)     Lichen plano-pilaris     Major depression, chronic 09/05/2015   Neuropathy, peripheral, idiopathic      uses gabapentin   Osteopenia 09/07/2019    Dexa 08/2019: T = -1.2 at wrist; back and hips excluded (DJD and hardware).    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 Repeat 2020   Urinary tract infection      hx of frequent     Past Surgical History:       Past Surgical History:  Procedure Laterality Date   APPENDECTOMY       APPLICATION OF CRANIAL NAVIGATION Right 04/30/2021    Procedure: APPLICATION OF CRANIAL NAVIGATION;  Surgeon: Earnie Larsson, MD;  Location: [place];  Service: Neurosurgery;  Laterality: Right;   BREAST LUMPECTOMY Left 2015   CATARACT EXTRACTION Bilateral     cataracts       CHOLECYSTECTOMY N/A 10/29/2016    Procedure: LAPAROSCOPIC CHOLECYSTECTOMY;  Surgeon: Erroll Luna, MD;  Location: [place];  Service:  General;  Laterality: N/A;   COLONOSCOPY       CRANIOTOMY Right 04/30/2021    Procedure: Right Parietal Craniotomy for tumor with Brain Lab;  Surgeon: Earnie Larsson, MD;  Location: [place];  Service: Neurosurgery;  Laterality: Right;   EYE SURGERY       INTERCOSTAL NERVE BLOCK Left 06/13/2021    Procedure: INTERCOSTAL NERVE BLOCK;  Surgeon: Melrose Nakayama, MD;  Location: [place];  Service: Thoracic;  Laterality: Left;   LIPOSUCTION       LYMPH NODE DISSECTION Left 06/13/2021    Procedure: LYMPH NODE DISSECTION;  Surgeon: Melrose Nakayama, MD;  Location: [place];  Service: Thoracic;  Laterality: Left;   TOTAL HIP ARTHROPLASTY        bilateral hip arthoplasty   TOTAL HIP ARTHROPLASTY Right 12/18/2015    Procedure: RIGHT TOTAL HIP ARTHROPLASTY ANTERIOR APPROACH;  Surgeon: Gaynelle Arabian, MD;  Location: [facility];  Service: Orthopedics;  Laterality: Right;   TOTAL KNEE ARTHROPLASTY       TOTAL KNEE ARTHROPLASTY Left 11/25/2016    Procedure: LEFT TOTAL KNEE ARTHROPLASTY;  Surgeon: Gaynelle Arabian, MD;  Location: [facility];  Service: Orthopedics;  Laterality: Left;  with abductor block   TUBAL LIGATION  UPPER GASTROINTESTINAL ENDOSCOPY          Assessment & Plan Clinical Impression: Tara Murphy is a 75 year old female with history of IBS, left breast cancer, idiopathic neuropathy, who started developing left sided weakness with sensory deficits followed by seizure episode on 04/24/21 due to large fronto-parietal tumor that was excised by Dr. Annette Stable and treated with stereotactic XRT.  History taken from chart review and patient. She underwent CIR with outpatient tx for residual left sided weakness/balance deficits. She was found to have LUL mass on recent PET scan concerning of Stage IV adenocarcinoma and was admitted on 06/13/21 for robotic LUL lobectomy with lymph node dissection with intercostal block level 3-10 by Dr. Roxan Hockey. Post op on cipro due to concerns of UTI. CT tube removed with follow up  CXR showing small stable left latera PTX.  Respiratory status has been stable. She has had increase in left sided weakness affecting ADLs and mobility. CIR recommended due to functional decline. Please see preadmission assessment from earlier today as well. Patient transferred to CIR on 06/20/2021 .     Pt presents with decreased activity tolerance, decreased functional mobility, decreased balance, decreased coordination Limiting pt's independence with leisure/community pursuits. Pt referred by team for participation in kitchen safety/home management group.  Pt participated in pt education/group discussion on energy conservation, setting up kitchen for ease of use, how to safely transport needed items, and reducing fall hazards.  Pt offered suggestions to the group based off her personal experience from previous health issues. Handout provided. Plan  No further TR, pt is d/c 10/7.  Recommendations for other services: None   Discharge Criteria: Patient will be discharged from TR if patient refuses treatment 3 consecutive times without medical reason.  If treatment goals not met, if there is a change in medical status, if patient makes no progress towards goals or if patient is discharged from hospital.  The above assessment, treatment plan, treatment alternatives and goals were discussed and mutually agreed upon: by patient  [place] 06/28/2021, 4:44 PM

## 2021-06-28 NOTE — Progress Notes (Signed)
Inpatient Rehabilitation Care Coordinator Discharge Note   Patient Details  Name: Tara Murphy MRN: [id number] Date of Birth: [birth year]   Discharge location: HOME WITH HUSBAND WHO CAN PROVIDE SUPERVISION LEVEL  Length of Stay:  9 DAYS  Discharge activity level: SUPERVISION LEVEL  Home/community participation: YES  Patient response NW:GNFAOZ Literacy - How often do you need to have someone help you when you read instructions, pamphlets, or other written material from your doctor or pharmacy?: Never  Patient response HY:QMVHQI Isolation - How often do you feel lonely or isolated from those around you?: Never  Services provided included: MD, RD, PT, OT, SLP, RN, CM, TR, Pharmacy, SW  Financial Services:  Financial Services Utilized: Medicare    Choices offered to/list presented to: PT  Follow-up services arranged:  Outpatient, Patient/Family has no preference for HH/DME agencies    Outpatient Servicies: [facility] : HAS DME FROM PREVIOUS ADMIT    Patient response to transportation need: Is the patient able to respond to transportation needs?: Yes In the past 12 months, has lack of transportation kept you from medical appointments or from getting medications?: No In the past 12 months, has lack of transportation kept you from meetings, work, or from getting things needed for daily living?: No    Comments (or additional information):PT DID WELL AND REACHED THE GOALS SHE SET FOR HERSELF. SHE DOES NOT FEEL OP-SPT IS NEEDED. SHE GOT WONDERFUL NEWS WHILE ON REHAB REGARDING HER CANCER. READY FOR DISCHARGE HOME  Patient/Family verbalized understanding of follow-up arrangements:  Yes  Individual responsible for coordination of the follow-up plan: TERRY-HUSBAND [phone]  Confirmed correct DME delivered: Elease Hashimoto 06/28/2021    Elease Hashimoto

## 2021-06-28 NOTE — Progress Notes (Addendum)
Physical Therapy Discharge Summary  Patient Details  Name: Tara Murphy MRN: [id number] Date of Birth: 11/26/1945  Today's Date: 06/28/2021 PT Individual Time: 1000-1100 PT Individual Time Calculation (min): 60 min    Patient has met 7 of 7 long term goals due to improved activity tolerance, improved balance, increased strength, ability to compensate for deficits, improved awareness, and improved coordination.  Patient to discharge at an ambulatory level Modified Independent.   Patient's care partner is independent to provide the necessary physical assistance at discharge. Pt is mod I with RW and L AFO with all gait and transfers. CGA for curb steps with RW. No family education was performed d/t pt's high level of mobility.   Reasons goals not met: NA  Recommendation:  Patient will benefit from ongoing skilled PT services in outpatient setting to continue to advance safe functional mobility, address ongoing impairments in LLE strength and coordination, and minimize fall risk.  Equipment: No equipment provided  Reasons for discharge: treatment goals met and discharge from hospital  Patient/family agrees with progress made and goals achieved: Yes  Skilled Therapeutic Interventions/Progress Updates:  Pt received in recliner and agreeable to therapy.  No complaint of pain. Pt donned L AFO mod I. ambulatory transfer to w/c with RW and AFO, mod I. Pt transported to therapy gym for time management and energy conservation. Pt performed car transfer mod I. Navigated ramp and mulch with supervision, curb step with CGA and VC for technique. Pt then navigated 6" steps x 4 and x 12 with BUE support. Pt had incidence of L knee buckling while trying to step up with LLE. Max A to recover. Education on ascending steps with RLE leading. Pt transported to ADL apartment for therapy group and was left seated in w/c.   PT Discharge Precautions/Restrictions Precautions Precautions: Fall Precaution Comments:  left foot drop Required Braces or Orthoses: Other Brace Other Brace: L AFo, educated on adherence Restrictions Weight Bearing Restrictions: No Vital Signs Therapy Vitals Temp: 97.8 F (36.6 C) Temp Source: Oral Pulse Rate: 95 Resp: 16 BP: (!) 141/58 Patient Position (if appropriate): Sitting Oxygen Therapy SpO2: 97 % O2 Device: Room Air    Pain Interference Pain Interference Pain Effect on Sleep: 1. Rarely or not at all Pain Interference with Therapy Activities: 1. Rarely or not at all Pain Interference with Day-to-Day Activities: 1. Rarely or not at all Vision/Perception  Perception Perception: Within Functional Limits  Cognition Overall Cognitive Status: Within Functional Limits for tasks assessed Arousal/Alertness: Awake/alert Year: 2022 Month: October Day of Week: Correct Attention: Alternating Sustained Attention: Appears intact Alternating Attention: Appears intact Memory: Appears intact Awareness: Appears intact Problem Solving: Appears intact Reasoning: Appears intact Sequencing: Appears intact Decision Making: Appears intact Safety/Judgment: Appears intact Sensation Sensation Light Touch: Appears Intact Peripheral sensation comments: improved from baseline, normal sensation Hot/Cold: Appears Intact Proprioception: Appears Intact Coordination Gross Motor Movements are Fluid and Coordinated: No Coordination and Movement Description: 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  Mobility Transfers Transfers: Sit to Stand;Stand to Sit;Stand Pivot Transfers Sit to Stand: Independent with assistive device Stand to Sit: Independent with assistive device Stand Pivot Transfers: Independent with assistive device Stand Pivot Transfer Details: Verbal cues for technique;Verbal cues for safe use of  DME/AE;Verbal cues for precautions/safety;Verbal cues for sequencing Transfer (Assistive device): Rolling walker Locomotion  Gait Ambulation: Yes Gait Assistance: Supervision/Verbal cueing Gait Distance (Feet):  150 Feet Assistive device: Rolling walker Gait Assistance Details: Verbal cues for safe use of DME/AE;Verbal cues for precautions/safety;Verbal cues for sequencing Gait Gait: Yes Gait Pattern: Impaired Gait Pattern: Decreased stance time - left;Decreased stride length;Decreased dorsiflexion - left;Decreased weight shift to left;Left circumduction;Left hip hike;Trunk flexed;Poor foot clearance - right;Poor foot clearance - left;Narrow base of support;Decreased trunk rotation;Step-through pattern;Decreased step length - right;Trendelenburg;Lateral hip instability Gait velocity: reduced High Level Ambulation High Level Ambulation: Side stepping Stairs / Additional Locomotion Stairs: Yes Stairs Assistance: Supervision/Verbal cueing Stair Management Technique: Two rails Number of Stairs: 12 Height of Stairs: 6 Ramp: Supervision/Verbal cueing Curb: Contact Guard/Touching assist Wheelchair Mobility Wheelchair Mobility: No  Trunk/Postural Assessment  Cervical Assessment Cervical Assessment: Within Functional Limits Thoracic Assessment Thoracic Assessment: Within Functional Limits Lumbar Assessment Lumbar Assessment: Within Functional Limits Postural Control Postural Control: Within Functional Limits  Balance Balance Balance Assessed: Yes Static Sitting Balance Static Sitting - Balance Support: Feet supported;Bilateral upper extremity supported Static Sitting - Level of Assistance: 6: Modified independent (Device/Increase time) Dynamic Sitting Balance Dynamic Sitting - Balance Support: Feet supported;No upper extremity supported Dynamic Sitting - Level of Assistance: 6: Modified independent (Device/Increase time) Static Standing Balance Static Standing - Balance Support:  Bilateral upper extremity supported Static Standing - Level of Assistance: 6: Modified independent (Device/Increase time) Dynamic Standing Balance Dynamic Standing - Balance Support: During functional activity Dynamic Standing - Level of Assistance: 6: Modified independent (Device/Increase time) Extremity Assessment      RLE Assessment RLE Assessment: Exceptions to [facility] General Strength Comments: grossly generalized to 4+/5 LLE Assessment LLE Assessment: Exceptions to [facility] LLE Strength Left Hip Flexion: 4-/5 Left Knee Flexion: 2+/5 Left Knee Extension: 2+/5 Left Ankle Dorsiflexion: 2+/5 Left Ankle Plantar Flexion: 3+/5    Che Below C Debara Kamphuis 06/28/2021, 4:46 PM

## 2021-06-28 NOTE — Progress Notes (Signed)
Occupational Therapy Session Note  Patient Details  Name: Tara Murphy MRN: [id number] Date of Birth: 03/12/1946  Today's Date: 06/28/2021 OT Group Time: 1103-1203 OT Group Time Calculation (min): 60 min   Short Term Goals: Week 1:  OT Short Term Goal 1 (Week 1): STGs = LTGs at Mod I/Supervision  Skilled Therapeutic Interventions/Progress Updates:  Pt participated in group session with a focus on kitchen tasks/mobility, energy conservation strategies ( ECS) and safe kitchen set- up for DC. Pt reports she wants to be able to make simple meals at home and make her coffee in the morning. Discussed energy conservation strategies in relation to kitchen tasks such as using RW bag, rearranging frequently used kitchen items and eliminating potential fall risks in the kitchen. Discussed how to transport hot coffee in kitchen and utilize counter tops to maneuver items in kitchen. Pt participating in group conversation of safety questions such as "how would you clean up spilled water?", "what do you do if the smoke alarm goes off?", " what would you do if there was a fire?", and "what would you do if you drop something?" Pt actively participating in group discussions and even offering advice to other group participants.Issued pt educational handout to increase carryover about ECS and home safety. Pt transported back to room by RT.  Therapy Documentation Precautions:  Precautions Precautions: Fall Precaution Comments: left foot drop Required Braces or Orthoses: Other Brace Other Brace: L AFo, educated on adherence Restrictions Weight Bearing Restrictions: No  Pain: pt reports no pain during session    Therapy/Group: Group Therapy  [name] 06/28/2021, 3:36 PM

## 2021-06-28 NOTE — Discharge Summary (Addendum)
Physician Discharge Summary  Patient ID: Tara Murphy MRN: [id number] DOB/AGE: Nov 01, 1945 75 y.o.  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 [facility])   Sleep disturbance   Discharged Condition: stable  Significant Diagnostic Studies: N/A   Labs:  Basic Metabolic Panel: BMP Latest Ref Rng & Units 06/25/2021 06/21/2021 06/15/2021  Glucose 70 - 99 mg/dL 100(H) 110(H) 114(H)  BUN 8 - 23 mg/dL 14 14 12   Creatinine 0.44 - 1.00 mg/dL 0.43(L) 0.50 0.57  BUN/Creat Ratio 6 - 22 (calc) - - -  Sodium 135 - 145 mmol/L 139 137 135  Potassium 3.5 - 5.1 mmol/L 3.8 3.7 4.3  Chloride 98 - 111 mmol/L 106 103 102  CO2 22 - 32 mmol/L 25 25 29   Calcium 8.9 - 10.3 mg/dL 8.4(L) 8.5(L) 8.3(L)      CBC: CBC Latest Ref Rng & Units 06/25/2021 06/21/2021 06/15/2021  WBC 4.0 - 10.5 K/uL 8.6 9.2 9.9  Hemoglobin 12.0 - 15.0 g/dL 10.7(L) 11.2(L) 11.4(L)  Hematocrit 36.0 - 46.0 % 31.6(L) 33.6(L) 33.2(L)  Platelets 150 - 400 K/uL 260 245 182      CBG: No results for input(s): GLUCAP in the last 168 hours.  Brief HPI:   Tara Murphy is a 75 y.o. female with IBS, left breast cancer, idiopathic neuropathy who started developing left-sided weakness with sensory deficits followed by seizure episode 04/24/2021 due to large frontoparietal tumor that was excised by Dr. Trenton Gammon and treated with stereotactic XRT.  Follow-up PET scan showed LUL mass concerning for stage IV adenocarcinoma and she was admitted on 06/14/2019 for robotic LUL lobectomy with lymph node dissection by Dr. Roxan Hockey.  Postop briefly on Cipro due to concerns of UTI.  Chest tube was removed and respiratory status was stable.  She was noted to have increasing left-sided weakness affecting ADLs and mobility.  CIR was recommended due to functional decline.    Hospital  Course: Tara Murphy was admitted to rehab 06/20/2021 for inpatient therapies to consist of PT and OT at least three hours five days a week. Past admission physiatrist, therapy team and rehab RN have worked together to provide customized collaborative inpatient rehab.  Dr. Roxan Hockey has discussed pathology report with her and she is to follow-up with Dr. Burr Medico for input on immunotherapy after discharge.  Her blood pressures were monitored on TID basis and have been stable.  Respiratory status has been stable and pain has been well controlled with as needed use of Tylenol.  XRT notes reviewed and steroids were tapered off by 10/06 per recommendations.  Follow-up CBC shows H&H to be relatively stable.  Check of electrolytes showed mild elevation in fasting blood sugars likely due to steroids.  Left left lobectomy site incisions are healing well without any signs or symptoms of infection.  Insomnia has been managed with use of melatonin.  She is continent of bowel and bladder.  She has made good gains during her rehab stay and is currently at modified independent level.  She will continue to receive outpatient PT and OT at [facility] outpatient rehab after discharge.   Rehab course: During patient's stay in rehab weekly team conferences were held to monitor patient's progress, set goals and discuss barriers to discharge. At admission, patient required contact-guard to min assist for basic ADL tasks and min assist for mobility. She  has had improvement  in activity tolerance, balance, postural control as well as ability to compensate for deficits.  She is able to complete ADL tasks independently.  She is modified independent for transfers and is able to ambulate 150 feet with rolling walker and verbal cues for safety.  No family education needed due to use of level of independence.   Discharge disposition: 01-Home or Self Care  Diet: Home  Special Instructions: No driving or strenuous activity till cleared by  MD  Allergies as of 06/29/2021       Reactions   Fluorescein Itching, Other (See Comments)   Contrast Media [iodinated Diagnostic Agents] Hives   Hydromorphone Hcl Nausea Only   Other Rash   Certain Band-Aids don't agree with the patient's skin; hospital ID wristband causes rashes   Sulfamethoxazole-trimethoprim Hives   Fentanyl Other (See Comments)   Redness and flushing over body    Tape Rash   Certain Band-Aids don't agree with the patient's skin; hospital ID wristband causes rashes   Terbinafine And Related Hives, Rash        Medication List     STOP taking these medications    dexamethasone 2 MG tablet Commonly known as: DECADRON   traMADol 50 MG tablet Commonly known as: ULTRAM       TAKE these medications    acetaminophen 325 MG tablet Commonly known as: TYLENOL Take 1-2 tablets (325-650 mg total) by mouth every 4 (four) hours as needed for mild pain.   busPIRone 7.5 MG tablet Commonly known as: BUSPAR Take 1 tablet (7.5 mg total) by mouth 2 (two) times daily as needed (anxiety).   clobetasol 0.05 % external solution Commonly known as: TEMOVATE Apply 1 application topically daily as needed (irritation).   fluticasone 50 MCG/ACT nasal spray Commonly known as: FLONASE Place 1 spray into both nostrils daily as needed for allergies or rhinitis.   gabapentin 300 MG capsule Commonly known as: NEURONTIN Take 2 capsules (600 mg total) by mouth at bedtime.   melatonin 3 MG Tabs tablet Take 1 tablet (3 mg total) by mouth at bedtime.   rOPINIRole 4 MG tablet Commonly known as: REQUIP Take 1 tablet (4 mg total) by mouth at bedtime.        Follow-up Information     Melrose Nakayama, MD. Call.   Specialty: Cardiothoracic Surgery Why: for follow up Contact information: [place] [address] [phone]         Kyung Rudd, MD Follow up.   Specialty: Radiation Oncology Why: keep f/u appt Contact  information: [address] [place] [address] [phone]         Leamon Arnt, MD. Call.   Specialty: Family Medicine Why: for routine follow up Contact information: [name] [place] [address] [phone]         Rodney Booze, MD Follow up.   Specialty: Pain Medicine Why: keep f/u appt Contact information: [address] [place] [address] [phone]                 Signed: Bary Leriche 07/02/2021, 5:33 PM

## 2021-06-29 MED ORDER — MELATONIN 3 MG PO TABS: 3.0000 mg | ORAL_TABLET | Freq: Every day | ORAL | 0 refills | Status: AC

## 2021-06-29 MED ORDER — ACETAMINOPHEN 325 MG PO TABS: 325.0000 mg | ORAL_TABLET | ORAL | Status: AC | PRN

## 2021-06-29 MED ORDER — GABAPENTIN 300 MG PO CAPS: 600.0000 mg | ORAL_CAPSULE | Freq: Every day | ORAL | Status: AC

## 2021-06-29 NOTE — Progress Notes (Signed)
Patient discharged home with family this afternoon. Information packet given to patient per PA, no questions noted at this time. Taken down via wheelchair. Surgical site to left flank is clean and dry, sutures removed yesterday, well approximated. Angie Fava

## 2021-06-29 NOTE — Progress Notes (Signed)
PROGRESS NOTE   Subjective/Complaints:  Is independent in room- Pt ready for d/c- asking about f/u- already has oncology, etc.     ROS:  Pt denies SOB, abd pain, CP, N/V/C/D, and vision changes  Objective:   No results found. No results for input(s): WBC, HGB, HCT, PLT in the last 72 hours.  No results for input(s): NA, K, CL, CO2, GLUCOSE, BUN, CREATININE, CALCIUM in the last 72 hours.   Intake/Output Summary (Last 24 hours) at 06/29/2021 0934 Last data filed at 06/28/2021 2121 Gross per 24 hour  Intake 750 ml  Output --  Net 750 ml        Physical Exam: Vital Signs Blood pressure 111/66, pulse 67, temperature 98 F (36.7 C), resp. rate 18, height 5' 6.75" (1.695 m), weight 93.9 kg, SpO2 96 %.         General: awake, alert, appropriate, sitting up in bedside chair- excited about d/c. NAD HENT: conjugate gaze; oropharynx moist CV: regular rate; no JVD Pulmonary: CTA on R- decreased on L due to lobectomy GI: soft, NT, ND, (+)BS Psychiatric: appropriate Neurological: Ox3    Cervical back: Normal range of motion and neck supple.     Comments: LE edema No tenderness in extremities Left chest wall tenderness with purple bruising - bruising healing well on chest and abdomen Skin:    General: Skin is warm and dry.   Neurological:     Mental Status: She is alert and oriented to person, place, and time.     Comments: Alert Motor: B/l UE 5/5 proximal to distal RLE: 5/5 proximal to distal LLE: HF 2-/5, KE 2-5, ADF 0/5   Assessment/Plan: 1. Functional deficits which require 3+ hours per day of interdisciplinary therapy in a comprehensive inpatient rehab setting. Physiatrist is providing close team supervision and 24 hour management of active medical problems listed below. Physiatrist and rehab team continue to assess barriers to discharge/monitor patient progress toward functional and medical goals  Care  Tool:  Bathing    Body parts bathed by patient: Face, Right arm, Left arm, Chest, Abdomen, Front perineal area, Right upper leg, Left upper leg, Right lower leg, Left lower leg, Buttocks         Bathing assist Assist Level: Independent with assistive device     Upper Body Dressing/Undressing Upper body dressing   What is the patient wearing?: Bra, Pull over shirt    Upper body assist Assist Level: Independent with assistive device    Lower Body Dressing/Undressing Lower body dressing      What is the patient wearing?: Pants, Underwear/pull up     Lower body assist Assist for lower body dressing: Independent with assitive device     Toileting Toileting    Toileting assist Assist for toileting: Independent with assistive device     Transfers Chair/bed transfer  Transfers assist     Chair/bed transfer assist level: Independent with assistive device Chair/bed transfer assistive device: Museum/gallery exhibitions officer assist      Assist level: Contact Guard/Touching assist Assistive device: Walker-rolling Max distance: 50   Walk 10 feet activity   Assist     Assist level:  Supervision/Verbal cueing Assistive device: Walker-rolling   Walk 50 feet activity   Assist    Assist level: Supervision/Verbal cueing Assistive device: Walker-rolling    Walk 150 feet activity   Assist Walk 150 feet activity did not occur: Safety/medical concerns         Walk 10 feet on uneven surface  activity   Assist Walk 10 feet on uneven surfaces activity did not occur: Safety/medical concerns         Wheelchair     Assist Is the patient using a wheelchair?: Yes Type of Wheelchair: Manual (transport only)    Wheelchair assist level: Dependent - Patient 0%      Wheelchair 50 feet with 2 turns activity    Assist        Assist Level: Dependent - Patient 0%   Wheelchair 150 feet activity     Assist      Assist Level:  Dependent - Patient 0%   Blood pressure 111/66, pulse 67, temperature 98 F (36.7 C), resp. rate 18, height 5' 6.75" (1.695 m), weight 93.9 kg, SpO2 96 %.    Medical Problem List and Plan: 1.  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 .              -patient may shower if incision covered             -ELOS/Goals: Supervision/Mod I 6-9 days.             con't PT and OT- d/c 10/7- d/c tomorrow- f/u already on d/c with Dr Ranell Patrick.   -d/c today- f/u already scheduled.  2.  Antithrombotics: -DVT/anticoagulation:  Pharmaceutical: Lovenox  10/4- pt refusing lovenox- don't want to stop- but she has right to refuse it.              -antiplatelet therapy:  3. Pain Management: Gabapentin 600 mg/hs             --d/c Tramadol due to history of seizure episode.   9/29- has oxy 5-10 mg q4 hours prn for severe pain- con't regimen- no Tramadol!  10/6- not taking pain meds- con't regimen with tylenol.              Monitor with increased exertion, pain control is good 4. Mood: LCSW to follow for evaluation and support.              -antipsychotic agents: N/A 5. Neuropsych: This patient is capable of making decisions on her own behalf. 6. Skin/Wound Care: Monitor incision for healing.   10/4- skin healing well- con't regimen 7. Fluids/Electrolytes/Nutrition: Monitor I/Os CMP ordered for tomorrow AM  9/29- ALT up slightly at 55- will recheck Monday  10/4- ALT down to 47- con't to monitor.  8. Metastatic cancer: Continue decadron 2 mg bid  9/30- Path T2A, N0- poorly differentiated nonsmall cell CA- CTS surgery went over path results with pt- leaving message for Oncology to see what plan is for Decadron as well as treatment after d/c?  10/3- Decadron was reduced to 2 mg daily x 7 days, then stop-   10/5- verified Decadron last day is Friday-  9. Acute on chronic sleep disturbance: Will add  melatonin at bedtime.   9/30- sleepy, but slept well- will monitor 10. RLS: Continue Requip hs 11. Constipation  10/3- LBM yesterday- con't regimen   10/6- going daily now.   LOS: 9  days A FACE TO FACE EVALUATION WAS PERFORMED  Cortnee Steinmiller 06/29/2021, 9:34 AM

## 2021-07-02 ENCOUNTER — Encounter: Payer: Self-pay | Admitting: Family Medicine

## 2021-07-02 ENCOUNTER — Ambulatory Visit (INDEPENDENT_AMBULATORY_CARE_PROVIDER_SITE_OTHER): Payer: Medicare Other | Admitting: Family Medicine

## 2021-07-02 ENCOUNTER — Other Ambulatory Visit: Payer: Self-pay

## 2021-07-02 VITALS — BP 128/78 | HR 88 | Temp 98.4°F | Wt 204.0 lb

## 2021-07-02 DIAGNOSIS — C7931 Secondary malignant neoplasm of brain: Secondary | ICD-10-CM

## 2021-07-02 DIAGNOSIS — R0600 Dyspnea, unspecified: Secondary | ICD-10-CM

## 2021-07-02 DIAGNOSIS — Z902 Acquired absence of lung [part of]: Secondary | ICD-10-CM

## 2021-07-02 DIAGNOSIS — R051 Acute cough: Secondary | ICD-10-CM

## 2021-07-02 NOTE — Progress Notes (Signed)
Subjective  CC:  Chief Complaint  Patient presents with   Fatigue   Cough    Since 06/27/2021. Worsening dry cough. Lobectomy 06/13/2021    HPI: Tara Murphy is a 75 y.o. female who presents to the office today to address the problems listed above in the chief complaint. 75 yo w/ metastatic adenocarcinoma of lung to brain s/p partial upper left lobectomy and postoperative rehab stay until 3 days ago presents for severe fatigue - falling asleep often and frequently, new cough - dry and mild, and worsening breathing intermittently. Husband was reportedly concerned by her rapid breathing while she slept last night. She is vague but reports she is more tired, become sob with ambulation and has cough associated with PND. No f/c/s, ST, nasal or chest congestion, pleuritic chest pain, exertional chest pain, change in leg swelling, calf pain of n/v. She admits she has been a little dehydrated from not drinking enough since being home and does have seasonal allergies.  I reviewed notes from recent hospitalization.  No visits with results within 1 Day(s) from this visit.  Latest known visit with results is:  Admission on 06/20/2021, Discharged on 06/29/2021  Component Date Value Ref Range Status   Sodium 06/21/2021 137  135 - 145 mmol/L Final   Potassium 06/21/2021 3.7  3.5 - 5.1 mmol/L Final   Chloride 06/21/2021 103  98 - 111 mmol/L Final   CO2 06/21/2021 25  22 - 32 mmol/L Final   Glucose, Bld 06/21/2021 110 (A) 70 - 99 mg/dL Final   BUN 06/21/2021 14  8 - 23 mg/dL Final   Creatinine, Ser 06/21/2021 0.50  0.44 - 1.00 mg/dL Final   Calcium 06/21/2021 8.5 (A) 8.9 - 10.3 mg/dL Final   Total Protein 06/21/2021 5.3 (A) 6.5 - 8.1 g/dL Final   Albumin 06/21/2021 2.9 (A) 3.5 - 5.0 g/dL Final   AST 06/21/2021 23  15 - 41 U/L Final   ALT 06/21/2021 55 (A) 0 - 44 U/L Final   Alkaline Phosphatase 06/21/2021 69  38 - 126 U/L Final   Total Bilirubin 06/21/2021 0.8  0.3 - 1.2 mg/dL Final   GFR,  Estimated 06/21/2021 >60  >60 mL/min Final   Anion gap 06/21/2021 9  5 - 15 Final   WBC 06/21/2021 9.2  4.0 - 10.5 K/uL Final   RBC 06/21/2021 3.66 (A) 3.87 - 5.11 MIL/uL Final   Hemoglobin 06/21/2021 11.2 (A) 12.0 - 15.0 g/dL Final   HCT 06/21/2021 33.6 (A) 36.0 - 46.0 % Final   MCV 06/21/2021 91.8  80.0 - 100.0 fL Final   MCH 06/21/2021 30.6  26.0 - 34.0 pg Final   MCHC 06/21/2021 33.3  30.0 - 36.0 g/dL Final   RDW 06/21/2021 14.8  11.5 - 15.5 % Final   Platelets 06/21/2021 245  150 - 400 K/uL Final   nRBC 06/21/2021 0.0  0.0 - 0.2 % Final   Neutrophils Relative % 06/21/2021 77  % Final   Neutro Abs 06/21/2021 7.1  1.7 - 7.7 K/uL Final   Lymphocytes Relative 06/21/2021 12  % Final   Lymphs Abs 06/21/2021 1.1  0.7 - 4.0 K/uL Final   Monocytes Relative 06/21/2021 5  % Final   Monocytes Absolute 06/21/2021 0.5  0.1 - 1.0 K/uL Final   Eosinophils Relative 06/21/2021 0  % Final   Eosinophils Absolute 06/21/2021 0.0  0.0 - 0.5 K/uL Final   Basophils Relative 06/21/2021 0  % Final   Basophils Absolute 06/21/2021 0.0  0.0 - 0.1 K/uL Final   Immature Granulocytes 06/21/2021 6  % Final   Abs Immature Granulocytes 06/21/2021 0.53 (A) 0.00 - 0.07 K/uL Final   WBC 06/25/2021 8.6  4.0 - 10.5 K/uL Final   RBC 06/25/2021 3.42 (A) 3.87 - 5.11 MIL/uL Final   Hemoglobin 06/25/2021 10.7 (A) 12.0 - 15.0 g/dL Final   HCT 06/25/2021 31.6 (A) 36.0 - 46.0 % Final   MCV 06/25/2021 92.4  80.0 - 100.0 fL Final   MCH 06/25/2021 31.3  26.0 - 34.0 pg Final   MCHC 06/25/2021 33.9  30.0 - 36.0 g/dL Final   RDW 06/25/2021 15.3  11.5 - 15.5 % Final   Platelets 06/25/2021 260  150 - 400 K/uL Final   nRBC 06/25/2021 0.2  0.0 - 0.2 % Final   Sodium 06/25/2021 139  135 - 145 mmol/L Final   Potassium 06/25/2021 3.8  3.5 - 5.1 mmol/L Final   Chloride 06/25/2021 106  98 - 111 mmol/L Final   CO2 06/25/2021 25  22 - 32 mmol/L Final   Glucose, Bld 06/25/2021 100 (A) 70 - 99 mg/dL Final   BUN 06/25/2021 14  8 - 23 mg/dL  Final   Creatinine, Ser 06/25/2021 0.43 (A) 0.44 - 1.00 mg/dL Final   Calcium 06/25/2021 8.4 (A) 8.9 - 10.3 mg/dL Final   Total Protein 06/25/2021 5.1 (A) 6.5 - 8.1 g/dL Final   Albumin 06/25/2021 2.8 (A) 3.5 - 5.0 g/dL Final   AST 06/25/2021 26  15 - 41 U/L Final   ALT 06/25/2021 47 (A) 0 - 44 U/L Final   Alkaline Phosphatase 06/25/2021 65  38 - 126 U/L Final   Total Bilirubin 06/25/2021 0.8  0.3 - 1.2 mg/dL Final   GFR, Estimated 06/25/2021 >60  >60 mL/min Final   Anion gap 06/25/2021 8  5 - 15 Final    Assessment  1. Metastatic adenocarcinoma to brain ([facility])   2. S/P partial lobectomy of lung   3. Dyspnea, unspecified type   4. Acute cough      Plan  Cough and sob in pt w/o metastatic lung cancer s/p lobectomy:  looks good overall. Stable vitals but mild hypoxia and clear lungs. Did have small pneumothorax, residual. Differential is broad. Check labs, covid, and chest xray and monitor sxs. She is deconditioned but at risk for DVT/PE, infection, worsening ptx or other. Pt and her daughter understand instructions and will f/u if worsen in any way.   Follow up: as needed   08/27/2021  Orders Placed This Encounter  Procedures   Novel Coronavirus, NAA ([facility])   DG Chest 2 View   CBC with Differential/Platelet   Comprehensive metabolic panel   D-dimer, quantitative   No orders of the defined types were placed in this encounter.     I reviewed the patients updated PMH, FH, and SocHx.    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    Priority: 1.   Familial hyperlipidemia 11/20/2017    Priority: 1.   History of left breast cancer 03/20/2015    Priority: 1.   Former smoker, stopped smoking in distant past 10/05/2019    Priority: 2.   Osteopenia 09/07/2019    Priority: 2.   IBS (irritable bowel syndrome) 10/08/2018    Priority: 2.   GERD (gastroesophageal reflux disease) 11/20/2017    Priority: 2.   Restless leg  syndrome 11/20/2017    Priority: 2.   OA (  osteoarthritis) of knee 12/18/2015    Priority: 2.   OA (osteoarthritis) of hip 12/18/2015    Priority: 2.   Osteoarthritis of spine with radiculopathy, 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    Priority: 2.   History of Clostridium difficile colitis 06/16/2015    Priority: 2.   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   Neuropathy, peripheral, idiopathic 06/13/2015    Priority: 2.   Hammer toe 04/13/2018    Priority: 3.   Osteoarthritis of right foot 04/13/2018    Priority: 3.   Lichen plano-pilaris 11/20/2017    Priority: 3.   Essential tremor 09/26/2016    Priority: 3.   Chronic allergic rhinitis 06/13/2015    Priority: 3.   Recurrent urinary tract infection 06/13/2015    Priority: 3.   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 ([place]) 06/20/2021   S/P partial lobectomy of lung    Sleep disturbance    Post-operative pain    Brain metastasis ([facility])    S/P lobectomy of lung 06/13/2021   Adjustment disorder    Cognitive impairment    Leukocytosis    Transaminitis    Postoperative pain    Left foot drop    Primary cancer of left upper lobe of lung ([place]) 05/03/2021   Metastatic adenocarcinoma to brain ([place]) 05/03/2021   Brain tumor ([place]) 05/03/2021   Brain mass 04/25/2021   Seizure ([place]) 04/24/2021   Pain of left hip joint 10/30/2020   Hepatic steatosis 06/08/2020   Current Meds  Medication Sig   acetaminophen (TYLENOL) 325 MG tablet Take 1-2 tablets (325-650 mg total) by mouth every 4 (four) hours as needed for mild pain.   busPIRone (BUSPAR) 7.5 MG tablet Take 1 tablet (7.5 mg total) by mouth 2 (two) times daily as needed (anxiety).   clobetasol (TEMOVATE) 0.05 % external solution Apply 1 application topically daily as needed (irritation).   fluticasone (FLONASE) 50 MCG/ACT nasal spray Place 1 spray into both nostrils daily as needed for allergies or rhinitis.   gabapentin (NEURONTIN) 300 MG  capsule Take 2 capsules (600 mg total) by mouth at bedtime.   melatonin 3 MG TABS tablet Take 1 tablet (3 mg total) by mouth at bedtime.   rOPINIRole (REQUIP) 4 MG tablet Take 1 tablet (4 mg total) by mouth at bedtime.    Allergies: Patient is allergic to fluorescein, contrast media [iodinated diagnostic agents], hydromorphone hcl, other, sulfamethoxazole-trimethoprim, fentanyl, tape, and terbinafine and related. Family History: Patient family history includes Arthritis in her father and mother; Breast cancer (age of onset: 64) in her sister; Crohn's disease in her mother; Diabetes in her mother; Hyperlipidemia in her maternal aunt; Lung cancer in her father; Stomach cancer in her maternal grandmother. Social History:  Patient  reports that she quit smoking about 29 years ago. Her smoking use included cigarettes. She smoked an average of 1 pack per day. She has never used smokeless tobacco. She reports current alcohol use of about 14.0 standard drinks per week. She reports that she does not use drugs.  Review of Systems: Constitutional: Negative for fever malaise or anorexia Cardiovascular: negative for chest pain Respiratory: negative for SOB or persistent cough Gastrointestinal: negative for abdominal pain  Objective  Vitals: BP 128/78   Pulse 88   Temp 98.4 F (36.9 C) (Temporal)   Wt 204 lb (92.5 kg)   SpO2 92%   BMI 32.19 kg/m  General: no acute distress , A&Ox3, sitting in wheelchair,  no tachypnea or respiratory distress HEENT: PEERL, conjunctiva normal, neck is supple, no jfc Cardiovascular:  RRR without murmur or gallop.  Respiratory:  Good breath sounds bilaterally, CTAB with normal respiratory effort, no wheeze or rales. Symmetric breath sounds Soft abd Ext w/o calf ttp, tr edema bilaterally Skin:  Warm, no rashes    Commons side effects, risks, benefits, and alternatives for medications and treatment plan prescribed today were discussed, and the patient expressed  understanding of the given instructions. Patient is instructed to call or message via MyChart if he/she has any questions or concerns regarding our treatment plan. No barriers to understanding were identified. We discussed Red Flag symptoms and signs in detail. Patient expressed understanding regarding what to do in case of urgent or emergency type symptoms.  Medication list was reconciled, printed and provided to the patient in AVS. Patient instructions and summary information was reviewed with the patient as documented in the AVS. This note was prepared with assistance of Dragon voice recognition software. Occasional wrong-word or sound-a-like substitutions may have occurred due to the inherent limitations of voice recognition software  This visit occurred during the SARS-CoV-2 public health emergency.  Safety protocols were in place, including screening questions prior to the visit, additional usage of staff PPE, and extensive cleaning of exam room while observing appropriate contact time as indicated for disinfecting solutions.

## 2021-07-02 NOTE — Patient Instructions (Signed)
Please follow up if symptoms do not improve or as needed.    I will release your lab results to you on your MyChart account with further instructions. Please reply with any questions.    Please go to our [facility] office to get your xrays done. You can walk in M-F between 8:30am- noon or 1pm - 5pm. Tell them you are there for xrays ordered by me. They will send me the results, then I will let you know the results with instructions.   Address: [address].  The Xray department is located in the basement.

## 2021-07-03 ENCOUNTER — Encounter: Payer: Self-pay | Admitting: Urology

## 2021-07-03 ENCOUNTER — Emergency Department (HOSPITAL_BASED_OUTPATIENT_CLINIC_OR_DEPARTMENT_OTHER): Payer: Medicare Other

## 2021-07-03 ENCOUNTER — Telehealth: Payer: Self-pay

## 2021-07-03 ENCOUNTER — Encounter (HOSPITAL_BASED_OUTPATIENT_CLINIC_OR_DEPARTMENT_OTHER): Payer: Self-pay | Admitting: Radiology

## 2021-07-03 ENCOUNTER — Other Ambulatory Visit: Payer: Self-pay

## 2021-07-03 ENCOUNTER — Encounter (HOSPITAL_BASED_OUTPATIENT_CLINIC_OR_DEPARTMENT_OTHER): Payer: Self-pay | Admitting: Emergency Medicine

## 2021-07-03 ENCOUNTER — Inpatient Hospital Stay (HOSPITAL_BASED_OUTPATIENT_CLINIC_OR_DEPARTMENT_OTHER)
Admission: EM | Admit: 2021-07-03 | Discharge: 2021-07-06 | DRG: 175 | Disposition: A | Discharge status: Home / Self Care | Payer: Medicare Other | Attending: Family Medicine | Admitting: Family Medicine

## 2021-07-03 ENCOUNTER — Emergency Department (HOSPITAL_BASED_OUTPATIENT_CLINIC_OR_DEPARTMENT_OTHER): Payer: Medicare Other | Admitting: Radiology

## 2021-07-03 DIAGNOSIS — J9601 Acute respiratory failure with hypoxia: Secondary | ICD-10-CM | POA: Diagnosis present

## 2021-07-03 DIAGNOSIS — J9811 Atelectasis: Secondary | ICD-10-CM | POA: Diagnosis not present

## 2021-07-03 DIAGNOSIS — I82432 Acute embolism and thrombosis of left popliteal vein: Secondary | ICD-10-CM | POA: Diagnosis present

## 2021-07-03 DIAGNOSIS — M21372 Foot drop, left foot: Secondary | ICD-10-CM | POA: Diagnosis present

## 2021-07-03 DIAGNOSIS — Z800 Family history of malignant neoplasm of digestive organs: Secondary | ICD-10-CM

## 2021-07-03 DIAGNOSIS — J189 Pneumonia, unspecified organism: Secondary | ICD-10-CM | POA: Diagnosis not present

## 2021-07-03 DIAGNOSIS — R0603 Acute respiratory distress: Secondary | ICD-10-CM

## 2021-07-03 DIAGNOSIS — Z882 Allergy status to sulfonamides status: Secondary | ICD-10-CM

## 2021-07-03 DIAGNOSIS — Z79890 Hormone replacement therapy (postmenopausal): Secondary | ICD-10-CM

## 2021-07-03 DIAGNOSIS — Z20822 Contact with and (suspected) exposure to covid-19: Secondary | ICD-10-CM | POA: Diagnosis present

## 2021-07-03 DIAGNOSIS — I2699 Other pulmonary embolism without acute cor pulmonale: Principal | ICD-10-CM | POA: Diagnosis present

## 2021-07-03 DIAGNOSIS — Z6832 Body mass index (BMI) 32.0-32.9, adult: Secondary | ICD-10-CM

## 2021-07-03 DIAGNOSIS — Z79899 Other long term (current) drug therapy: Secondary | ICD-10-CM

## 2021-07-03 DIAGNOSIS — Z803 Family history of malignant neoplasm of breast: Secondary | ICD-10-CM

## 2021-07-03 DIAGNOSIS — R000 Tachycardia, unspecified: Secondary | ICD-10-CM | POA: Diagnosis not present

## 2021-07-03 DIAGNOSIS — Z91041 Radiographic dye allergy status: Secondary | ICD-10-CM

## 2021-07-03 DIAGNOSIS — Z801 Family history of malignant neoplasm of trachea, bronchus and lung: Secondary | ICD-10-CM

## 2021-07-03 DIAGNOSIS — Z87891 Personal history of nicotine dependence: Secondary | ICD-10-CM

## 2021-07-03 DIAGNOSIS — R0602 Shortness of breath: Secondary | ICD-10-CM | POA: Diagnosis not present

## 2021-07-03 DIAGNOSIS — F39 Unspecified mood [affective] disorder: Secondary | ICD-10-CM | POA: Diagnosis present

## 2021-07-03 DIAGNOSIS — Z96643 Presence of artificial hip joint, bilateral: Secondary | ICD-10-CM | POA: Diagnosis present

## 2021-07-03 DIAGNOSIS — Z8261 Family history of arthritis: Secondary | ICD-10-CM

## 2021-07-03 DIAGNOSIS — Z853 Personal history of malignant neoplasm of breast: Secondary | ICD-10-CM

## 2021-07-03 DIAGNOSIS — Z833 Family history of diabetes mellitus: Secondary | ICD-10-CM

## 2021-07-03 DIAGNOSIS — R911 Solitary pulmonary nodule: Secondary | ICD-10-CM | POA: Diagnosis not present

## 2021-07-03 DIAGNOSIS — C3412 Malignant neoplasm of upper lobe, left bronchus or lung: Secondary | ICD-10-CM | POA: Diagnosis present

## 2021-07-03 DIAGNOSIS — Z902 Acquired absence of lung [part of]: Secondary | ICD-10-CM

## 2021-07-03 DIAGNOSIS — Z888 Allergy status to other drugs, medicaments and biological substances status: Secondary | ICD-10-CM

## 2021-07-03 DIAGNOSIS — I82452 Acute embolism and thrombosis of left peroneal vein: Secondary | ICD-10-CM | POA: Diagnosis present

## 2021-07-03 DIAGNOSIS — Z923 Personal history of irradiation: Secondary | ICD-10-CM

## 2021-07-03 DIAGNOSIS — C7931 Secondary malignant neoplasm of brain: Secondary | ICD-10-CM | POA: Diagnosis not present

## 2021-07-03 DIAGNOSIS — E669 Obesity, unspecified: Secondary | ICD-10-CM | POA: Diagnosis present

## 2021-07-03 DIAGNOSIS — Z885 Allergy status to narcotic agent status: Secondary | ICD-10-CM

## 2021-07-03 DIAGNOSIS — R5383 Other fatigue: Secondary | ICD-10-CM | POA: Diagnosis not present

## 2021-07-03 DIAGNOSIS — I82442 Acute embolism and thrombosis of left tibial vein: Secondary | ICD-10-CM | POA: Diagnosis present

## 2021-07-03 DIAGNOSIS — Y95 Nosocomial condition: Secondary | ICD-10-CM | POA: Diagnosis present

## 2021-07-03 DIAGNOSIS — J90 Pleural effusion, not elsewhere classified: Secondary | ICD-10-CM | POA: Diagnosis not present

## 2021-07-03 DIAGNOSIS — Z96652 Presence of left artificial knee joint: Secondary | ICD-10-CM | POA: Diagnosis present

## 2021-07-03 DIAGNOSIS — E7849 Other hyperlipidemia: Secondary | ICD-10-CM | POA: Diagnosis present

## 2021-07-03 LAB — COMPREHENSIVE METABOLIC PANEL
ALT: 43 U/L — ABNORMAL HIGH (ref 0–35)
AST: 25 U/L (ref 0–37)
Albumin: 3.4 g/dL — ABNORMAL LOW (ref 3.5–5.2)
Alkaline Phosphatase: 94 U/L (ref 39–117)
BUN: 9 mg/dL (ref 6–23)
CO2: 25 mEq/L (ref 19–32)
Calcium: 8.7 mg/dL (ref 8.4–10.5)
Chloride: 100 mEq/L (ref 96–112)
Creatinine, Ser: 0.38 mg/dL — ABNORMAL LOW (ref 0.40–1.20)
GFR: 98.69 mL/min (ref >60.00)
Glucose, Bld: 102 mg/dL — ABNORMAL HIGH (ref 70–99)
Potassium: 4.1 mEq/L (ref 3.5–5.1)
Sodium: 136 mEq/L (ref 135–145)
Total Bilirubin: 0.7 mg/dL (ref 0.2–1.2)
Total Protein: 6 g/dL (ref 6.0–8.3)

## 2021-07-03 LAB — BASIC METABOLIC PANEL
Anion gap: 11 (ref 5–15)
BUN: 13 mg/dL (ref 8–23)
CO2: 24 mmol/L (ref 22–32)
Calcium: 8.9 mg/dL (ref 8.9–10.3)
Chloride: 101 mmol/L (ref 98–111)
Creatinine, Ser: 0.38 mg/dL — ABNORMAL LOW (ref 0.44–1.00)
GFR, Estimated: >60 mL/min (ref >60)
Glucose, Bld: 91 mg/dL (ref 70–99)
Potassium: 3.8 mmol/L (ref 3.5–5.1)
Sodium: 136 mmol/L (ref 135–145)

## 2021-07-03 LAB — CBC WITH DIFFERENTIAL/PLATELET
Abs Immature Granulocytes: 0.2 10*3/uL — ABNORMAL HIGH (ref 0.00–0.07)
Basophils Absolute: 0.1 10*3/uL (ref 0.0–0.1)
Basophils Absolute: 0.1 10*3/uL (ref 0.0–0.1)
Basophils Relative: 1.2 % (ref 0.0–3.0)
Basophils Relative: 1 %
Eosinophils Absolute: 0.1 10*3/uL (ref 0.0–0.7)
Eosinophils Absolute: 0 10*3/uL (ref 0.0–0.5)
Eosinophils Relative: 0.6 % (ref 0.0–5.0)
Eosinophils Relative: 0 %
HCT: 31.9 % — ABNORMAL LOW (ref 36.0–46.0)
HCT: 31.5 % — ABNORMAL LOW (ref 36.0–46.0)
Hemoglobin: 10.7 g/dL — ABNORMAL LOW (ref 12.0–15.0)
Hemoglobin: 10.5 g/dL — ABNORMAL LOW (ref 12.0–15.0)
Immature Granulocytes: 2 %
Lymphocytes Relative: 16.4 % (ref 12.0–46.0)
Lymphocytes Relative: 12 %
Lymphs Abs: 1.4 10*3/uL (ref 0.7–4.0)
Lymphs Abs: 1.2 10*3/uL (ref 0.7–4.0)
MCH: 30.1 pg (ref 26.0–34.0)
MCHC: 33.5 g/dL (ref 30.0–36.0)
MCHC: 33.3 g/dL (ref 30.0–36.0)
MCV: 90.6 fl (ref 78.0–100.0)
MCV: 90.3 fL (ref 80.0–100.0)
Monocytes Absolute: 0.4 10*3/uL (ref 0.1–1.0)
Monocytes Absolute: 0.5 10*3/uL (ref 0.1–1.0)
Monocytes Relative: 5.1 % (ref 3.0–12.0)
Monocytes Relative: 5 %
Neutro Abs: 6.7 10*3/uL (ref 1.4–7.7)
Neutro Abs: 8.2 10*3/uL — ABNORMAL HIGH (ref 1.7–7.7)
Neutrophils Relative %: 76.7 % (ref 43.0–77.0)
Neutrophils Relative %: 80 %
Platelets: 293 10*3/uL (ref 150.0–400.0)
Platelets: 296 10*3/uL (ref 150–400)
RBC: 3.52 Mil/uL — ABNORMAL LOW (ref 3.87–5.11)
RBC: 3.49 MIL/uL — ABNORMAL LOW (ref 3.87–5.11)
RDW: 15.5 % (ref 11.5–15.5)
RDW: 15.1 % (ref 11.5–15.5)
WBC: 8.7 10*3/uL (ref 4.0–10.5)
WBC: 10.1 10*3/uL (ref 4.0–10.5)
nRBC: 0 % (ref 0.0–0.2)

## 2021-07-03 LAB — TROPONIN I (HIGH SENSITIVITY)
Troponin I (High Sensitivity): 6 ng/L (ref <18)
Troponin I (High Sensitivity): 4 ng/L (ref <18)

## 2021-07-03 LAB — RESP PANEL BY RT-PCR (FLU A&B, COVID) ARPGX2
Influenza A by PCR: NEGATIVE
Influenza B by PCR: NEGATIVE
SARS Coronavirus 2 by RT PCR: NEGATIVE

## 2021-07-03 LAB — SARS-COV-2, NAA 2 DAY TAT

## 2021-07-03 LAB — NOVEL CORONAVIRUS, NAA: SARS-CoV-2, NAA: NOT DETECTED

## 2021-07-03 MED ORDER — SODIUM CHLORIDE 0.9 % IV SOLN
100.0000 mg | Freq: Once | INTRAVENOUS | Status: AC
  Administered 2021-07-03: 100 mg via INTRAVENOUS
  Filled 2021-07-03: qty 100

## 2021-07-03 MED ORDER — DIPHENHYDRAMINE HCL 50 MG/ML IJ SOLN
25.0000 mg | Freq: Once | INTRAMUSCULAR | Status: AC
  Administered 2021-07-03: 25 mg via INTRAVENOUS
  Filled 2021-07-03: qty 1

## 2021-07-03 MED ORDER — SODIUM CHLORIDE 0.9 % IV SOLN
1.0000 g | Freq: Once | INTRAVENOUS | Status: AC
  Administered 2021-07-03: 1 g via INTRAVENOUS
  Filled 2021-07-03: qty 10

## 2021-07-03 MED ORDER — APIXABAN 5 MG PO TABS
10.0000 mg | ORAL_TABLET | Freq: Two times a day (BID) | ORAL | Status: DC
  Administered 2021-07-03 – 2021-07-06 (×6): 10 mg via ORAL
  Filled 2021-07-03 (×3): qty 2
  Filled 2021-07-03: qty 4
  Filled 2021-07-03 (×2): qty 2

## 2021-07-03 MED ORDER — APIXABAN 5 MG PO TABS: 5.0000 mg | ORAL_TABLET | Freq: Two times a day (BID) | ORAL | Status: DC

## 2021-07-03 MED ORDER — DIPHENHYDRAMINE HCL 50 MG/ML IJ SOLN
50.0000 mg | Freq: Once | INTRAMUSCULAR | Status: AC
  Administered 2021-07-03: 50 mg via INTRAVENOUS
  Filled 2021-07-03: qty 1

## 2021-07-03 MED ORDER — IOHEXOL 350 MG/ML SOLN
100.0000 mL | Freq: Once | INTRAVENOUS | Status: AC | PRN
  Administered 2021-07-03: 75 mL via INTRAVENOUS

## 2021-07-03 MED ORDER — HYDROCORTISONE SOD SUC (PF) 100 MG IJ SOLR
200.0000 mg | Freq: Once | INTRAMUSCULAR | Status: AC
  Administered 2021-07-03: 200 mg via INTRAVENOUS
  Filled 2021-07-03: qty 4

## 2021-07-03 NOTE — Progress Notes (Signed)
ANTICOAGULATION CONSULT NOTE - Initial Consult  Pharmacy Consult for Eliquis (apixaban) dosing Indication: pulmonary embolus  Allergies  Allergen Reactions   Fluorescein Itching and Other (See Comments)   Contrast Media [Iodinated Diagnostic Agents] Hives   Hydromorphone Hcl Nausea Only   Other Rash    Certain Band-Aids don't agree with the patient's skin; hospital ID wristband causes rashes   Sulfamethoxazole-Trimethoprim Hives   Fentanyl Other (See Comments)    Redness and flushing over body    Tape Rash    Certain Band-Aids don't agree with the patient's skin; hospital ID wristband causes rashes   Terbinafine And Related Hives and Rash    Patient Measurements: Height: 5' 6.75" (169.5 cm) Weight: 92.5 kg (203 lb 14.8 oz) IBW/kg (Calculated) : 61.03  Vital Signs: Temp: 99.3 F (37.4 C) (10/11 1356) Temp Source: Oral (10/11 1356) BP: 131/73 (10/11 2200) Pulse Rate: 89 (10/11 2200)  Labs: Recent Labs    07/02/21 1649 07/03/21 1437 07/03/21 2025  HGB 10.7* 10.5*  --   HCT 31.9* 31.5*  --   PLT 293.0 296  --   CREATININE 0.38* 0.38*  --   TROPONINIHS  --  6 4    Estimated Creatinine Clearance: 71.7 mL/min (A) (by C-G formula based on SCr of 0.38 mg/dL (L)).   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[place])    S/p total hip replacement left   Breast cancer ([place]) 2015   left    Cataract    Cholecystolithiasis    Chronic allergic rhinitis    Depression    Former smoker, stopped smoking in distant past 10/05/2019   Quit 2000; 16 y smoking history   GERD (gastroesophageal reflux disease)    Hepatic steatosis 06/08/2020   Intermittent elevated LFTs and ultrasound 2018   Hyperlipemia    IBS (irritable bowel syndrome) 10/08/2018   Nl colonoscopy and EGD 09/2018   IBS (irritable bowel syndrome)    Lichen plano-pilaris    Major depression, chronic 09/05/2015   Neuropathy, peripheral,  idiopathic    uses gabapentin   Osteopenia 09/07/2019   Dexa 08/2019: T = -1.2 at wrist; back and hips excluded (DJD and hardware).    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 Repeat 2020   Urinary tract infection    hx of frequent     Medications:  (Not in a hospital admission)  Scheduled:   apixaban  10 mg Oral BID   Followed by   Derrill Memo ON 07/10/2021] apixaban  5 mg Oral BID   Infusions:   cefTRIAXone (ROCEPHIN)  IV     doxycycline (VIBRAMYCIN) IV      Assessment: Patient presented with shortness of breath and fatigue and was diagnosed with a PE. Hgb 10.5, HCT 31.5, and PLT 296. No signs of bleeding noted.   Goal of Therapy:  Monitor platelets by anticoagulation protocol: Yes   Plan:  Apixaban 10mg  PO BID x5 days  Followed by apixaban 5mg  BID thereafter Monitor for signs/sx of bleeding  Donald Pore 07/03/2021,10:09 PM

## 2021-07-03 NOTE — ED Notes (Signed)
Benadryl to be give at 2015 per radiology protocol.

## 2021-07-03 NOTE — Telephone Encounter (Signed)
Patient's daughter, Larene Beach contacted the office and left voicemail with concerns about an increasing non-productive cough. Patient is s/p RATS LULobectomy 06/13/21 with Dr. Roxan Hockey. She also stated that patient did not have a follow-up appointment after surgery. Call returned and she stated that she had contacted her mother's PCP, whom she had a appointment with today at [facility]. Follow-up appointment was scheduled for 07/17/21 as follow-up that was scheduled was done so while patient was still hospitalized. Patient's daughter acknowledged receipt.

## 2021-07-03 NOTE — Telephone Encounter (Signed)
Patient's daughter contacted the office to state that she was taking her mother to the ED. She is s/p RATS LULobectomy with Dr. Roxan Hockey 06/13/21. She stated that her mothers shortness of breath and O2 sats 80-87% on RA. She states that she did go see her PCP yesterday who was to get a chest xray today but patient unable to go due to worsening symptoms. Advised that Dr. Roxan Hockey would be made aware.

## 2021-07-03 NOTE — ED Provider Notes (Signed)
[facility] EMERGENCY DEPT Provider Note   CSN: [id number] Arrival date & time: 07/03/21  1351     History Chief Complaint  Patient presents with   Fatigue    Tara Murphy is a 75 y.o. female.  HPI  Patient with metastatic disease, recent left lobectomy 06/13/2021 presents with shortness of breath.  Patient reports she has been feeling short of breath since lobectomy, but it worsened about 2 days ago.  She started coughing 4 days ago, nonproductive cough without hematemesis.  Patient does not require oxygen at baseline, was satting at 87 to 92% on room air according to the daughter yesterday.  Shortness of breath is worsened by any exertion, she reports dyspnea on exertion.  There is some swelling in her legs, is mostly equal bilaterally.  Denies any chest pain, just endorses shortness of breath.  She also endorses feeling fatigued, she has been fatigued since the procedure but worsened in the last 2 days in concordance with the shortness of breath.  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[place])    S/p total hip replacement left   Breast cancer ([place]) 2015   left    Cataract    Cholecystolithiasis    Chronic allergic rhinitis    Depression    Former smoker, stopped smoking in distant past 10/05/2019   Quit 2000; 73 y smoking history   GERD (gastroesophageal reflux disease)    Hepatic steatosis 06/08/2020   Intermittent elevated LFTs and ultrasound 2018   Hyperlipemia    IBS (irritable bowel syndrome) 10/08/2018   Nl colonoscopy and EGD 09/2018   IBS (irritable bowel syndrome)    Lichen plano-pilaris    Major depression, 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[place]) 06/20/2021   S/P partial lobectomy of lung    Sleep disturbance    Post-operative pain    Brain metastasis ([facility])    S/P lobectomy of lung 06/13/2021   Adjustment disorder    Cognitive impairment    Leukocytosis    Transaminitis    Postoperative pain    Left foot drop    Primary cancer of left upper lobe of lung ([place]) 05/03/2021   Metastatic adenocarcinoma to brain ([place]) 05/03/2021   Brain tumor ([place]) 05/03/2021   Brain mass 04/25/2021   Seizure ([place]) 04/24/2021   Pain of left hip joint 10/30/2020   Hepatic steatosis 06/08/2020   Former smoker, stopped smoking in distant past 10/05/2019   Osteopenia 09/07/2019   IBS (irritable bowel syndrome) 10/08/2018   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   Hammer toe 04/13/2018   Osteoarthritis of right foot 04/13/2018   GERD (gastroesophageal reflux disease) 11/20/2017   Familial hyperlipidemia 76/22/6333   Lichen plano-pilaris 11/20/2017   Restless leg syndrome 11/20/2017   Essential tremor 09/26/2016   OA (osteoarthritis) of knee 12/18/2015   OA (osteoarthritis) of hip 12/18/2015   Osteoarthritis of spine with radiculopathy, 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   History  of Clostridium difficile colitis 06/16/2015   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, s/p THR 06/13/2015   Chronic allergic rhinitis 06/13/2015   Neuropathy, peripheral, idiopathic 06/13/2015   Recurrent urinary tract infection 06/13/2015   History of left breast cancer 03/20/2015    Past Surgical History:  Procedure Laterality Date   APPENDECTOMY     APPLICATION OF CRANIAL NAVIGATION Right 04/30/2021   Procedure: APPLICATION OF CRANIAL NAVIGATION;  Surgeon: Earnie Larsson, MD;  Location: [facility];  Service: Neurosurgery;  Laterality: Right;   BREAST LUMPECTOMY  Left 2015   CATARACT EXTRACTION Bilateral    cataracts     CHOLECYSTECTOMY N/A 10/29/2016   Procedure: LAPAROSCOPIC CHOLECYSTECTOMY;  Surgeon: Erroll Luna, MD;  Location: [facility];  Service: General;  Laterality: N/A;   COLONOSCOPY     CRANIOTOMY Right 04/30/2021   Procedure: Right Parietal Craniotomy for tumor with Brain Lab;  Surgeon: Earnie Larsson, MD;  Location: [facility];  Service: Neurosurgery;  Laterality: Right;   EYE SURGERY     INTERCOSTAL NERVE BLOCK Left 06/13/2021   Procedure: INTERCOSTAL NERVE BLOCK;  Surgeon: Melrose Nakayama, MD;  Location: [facility];  Service: Thoracic;  Laterality: Left;   LIPOSUCTION     LYMPH NODE DISSECTION Left 06/13/2021   Procedure: LYMPH NODE DISSECTION;  Surgeon: Melrose Nakayama, MD;  Location: [facility];  Service: Thoracic;  Laterality: Left;   TOTAL HIP ARTHROPLASTY     bilateral hip arthoplasty   TOTAL HIP ARTHROPLASTY Right 12/18/2015   Procedure: RIGHT TOTAL HIP ARTHROPLASTY ANTERIOR APPROACH;  Surgeon: Gaynelle Arabian, MD;  Location: [facility];  Service: Orthopedics;  Laterality: Right;   TOTAL KNEE ARTHROPLASTY     TOTAL KNEE ARTHROPLASTY Left 11/25/2016   Procedure: LEFT TOTAL KNEE ARTHROPLASTY;  Surgeon: Gaynelle Arabian, MD;  Location: [facility];  Service: Orthopedics;  Laterality: Left;  with abductor block   TUBAL LIGATION     UPPER GASTROINTESTINAL ENDOSCOPY       OB History   No obstetric history on file.     Family History  Problem Relation Age of Onset   Arthritis Mother    Diabetes Mother    Crohn's disease Mother    Arthritis Father    Lung cancer Father    Breast cancer Sister 76   Hyperlipidemia Maternal Aunt    Stomach cancer Maternal Grandmother    Colitis Neg Hx    Rectal cancer Neg Hx    Esophageal cancer Neg Hx     Social History   Tobacco Use   Smoking status: Former    Packs/day: 1.00    Types: Cigarettes    Quit date: 11/22/1991    Years since quitting: 29.6   Smokeless tobacco: Never  Vaping Use   Vaping Use:  Never used  Substance Use Topics   Alcohol use: Yes    Alcohol/week: 14.0 standard drinks    Types: 14 Glasses of wine per week   Drug use: No    Home Medications Prior to Admission medications   Medication Sig Start Date End Date Taking? Authorizing Provider  acetaminophen (TYLENOL) 325 MG tablet Take 1-2 tablets (325-650 mg total) by mouth every 4 (four) hours as needed for mild pain. 06/29/21   Love, Ivan Anchors, PA-C  busPIRone (BUSPAR) 7.5 MG tablet Take 1 tablet (7.5 mg total) by mouth 2 (two) times daily as needed (anxiety). 05/15/21   Angiulli, Lavon Paganini, PA-C  clobetasol (TEMOVATE) 0.05 % external solution Apply 1 application topically daily  as needed (irritation).    [provider]  fluticasone (FLONASE) 50 MCG/ACT nasal spray Place 1 spray into both nostrils daily as needed for allergies or rhinitis.    [provider]  gabapentin (NEURONTIN) 300 MG capsule Take 2 capsules (600 mg total) by mouth at bedtime. 06/29/21   Love, Ivan Anchors, PA-C  melatonin 3 MG TABS tablet Take 1 tablet (3 mg total) by mouth at bedtime. 06/29/21   Love, Ivan Anchors, PA-C  rOPINIRole (REQUIP) 4 MG tablet Take 1 tablet (4 mg total) by mouth at bedtime. 05/15/21   Angiulli, Lavon Paganini, PA-C  atorvastatin (LIPITOR) 10 MG tablet Take 1 tablet (10 mg total) by mouth 3 (three) times a week. Patient not taking: No sig reported 06/07/20 04/30/21  Leamon Arnt, MD    Allergies    Fluorescein, Contrast media [iodinated diagnostic agents], Hydromorphone hcl, Other, Sulfamethoxazole-trimethoprim, Fentanyl, Tape, and Terbinafine and related  Review of Systems   Review of Systems  Constitutional:  Positive for fatigue. Negative for chills and fever.  HENT:  Negative for ear pain and sore throat.   Eyes:  Negative for pain and visual disturbance.  Respiratory:  Positive for cough and shortness of breath.   Cardiovascular:  Negative for chest pain, palpitations and leg swelling.  Gastrointestinal:  Negative  for abdominal pain, nausea and vomiting.  Genitourinary:  Negative for dysuria and hematuria.  Musculoskeletal:  Negative for arthralgias and back pain.  Skin:  Negative for color change and rash.  Neurological:  Negative for dizziness, seizures and syncope.  All other systems reviewed and are negative.  Physical Exam Updated Vital Signs BP 128/64 (BP Location: Left Arm)   Pulse 97   Temp 99.3 F (37.4 C) (Oral)   Resp 18   SpO2 94%   Physical Exam Vitals and nursing note reviewed. Exam conducted with a chaperone present.  Constitutional:      Appearance: Normal appearance.  HENT:     Head: Normocephalic and atraumatic.  Eyes:     General: No scleral icterus.       Right eye: No discharge.        Left eye: No discharge.     Extraocular Movements: Extraocular movements intact.     Pupils: Pupils are equal, round, and reactive to light.  Cardiovascular:     Rate and Rhythm: Normal rate and regular rhythm.     Pulses: Normal pulses.     Heart sounds: Normal heart sounds. No murmur heard.   No friction rub. No gallop.     Comments: Heart rate elevated, not technically tachycardic at 97.  No murmurs, rubs, gallops appreciated.  Radial pulse 2+ equally bilaterally Pulmonary:     Effort: Tachypnea, accessory muscle usage and respiratory distress present.     Comments: Patient is tachypneic, speaking complete sentences.  Notable for accessory muscle use, tripoding.  Lung sounds are slightly diminished on the left side, no adventitious lung sounds appreciated. Abdominal:     General: Abdomen is flat. Bowel sounds are normal. There is no distension.     Palpations: Abdomen is soft.     Tenderness: There is no abdominal tenderness.  Skin:    General: Skin is warm and dry.     Coloration: Skin is not jaundiced.  Neurological:     Mental Status: She is alert. Mental status is at baseline.     Coordination: Coordination normal.    ED Results / Procedures / Treatments   Labs (all  labs  ordered are listed, but only abnormal results are displayed) Labs Reviewed  CBC WITH DIFFERENTIAL/PLATELET - Abnormal; Notable for the following components:      Result Value   RBC 3.49 (*)    Hemoglobin 10.5 (*)    HCT 31.5 (*)    Neutro Abs 8.2 (*)    Abs Immature Granulocytes 0.20 (*)    All other components within normal limits  BASIC METABOLIC PANEL - Abnormal; Notable for the following components:   Creatinine, Ser 0.38 (*)    All other components within normal limits  URINALYSIS, ROUTINE W REFLEX MICROSCOPIC    EKG None  Radiology DG Chest 2 View  Result Date: 07/03/2021 CLINICAL DATA:  Shortness of breath and fatigue. EXAM: CHEST - 2 VIEW COMPARISON:  Chest x-ray 06/17/2021, CT chest 04/25/2021 FINDINGS: The heart and mediastinal contours are unchanged. Status post left upper lobectomy. No pulmonary edema. Possible increase in trace to small volume left pleural effusion. No pneumothorax. No acute osseous abnormality. IMPRESSION: Possible increase in trace to small volume left pleural effusion in a patient status post left upper lobectomy. Electronically Signed   By: Iven Finn M.D.   On: 07/03/2021 15:16    Procedures Procedures   Medications Ordered in ED Medications  diphenhydrAMINE (BENADRYL) injection 25 mg (has no administration in time range)    ED Course  I have reviewed the triage vital signs and the nursing notes.  Pertinent labs & imaging results that were available during my care of the patient were reviewed by me and considered in my medical decision making (see chart for details).    MDM Rules/Calculators/A&P                           Vitals are stable, but she is on the border tachycardia with a heart rate of 97.  She was tachycardic on intake, she also has high risk factors for PE given recent surgery.  She is short of breath, she was hypoxic on room air.  At 86%, put on 2 L of oxygen.  Patient does have a history of contrast allergy,  discussed with patient and she states is only to the phosphorus containing type.  She has had IV contrast for CT abdomens in the past without anaphylaxis.  We will proceed with CTA to evaluate for PE.  Patient will need admission for acute respiratory distress with new oxygen requirement.  She is satting well with 2 L of oxygen but is still tachypneic.  Patient also has a right second subsegmental PE, she has lung parenchymal changes that are new since her most recent CT scan in August.  Suggestive of a multifocal pneumonia.  She will need admission for these.  We will start her on anticoagulation and plan to admit and transfer to [facility]  We will start patient on doxycycline and Rocephin.  After talking with hospitalist, we will proceed with Eliquis.  Spoke with Dr. Tonie Griffith who is happy to see the patient and admit to [facility]  Final Clinical Impression(s) / ED Diagnoses Final diagnoses:  None    Rx / DC Orders ED Discharge Orders     None        Sherrill Raring, Hershal Coria 07/03/21 2208    Davonna Belling, MD 07/04/21 0006

## 2021-07-03 NOTE — Progress Notes (Signed)
Patient reports shortness of breath, decreased appetite and fatigue. No other symptoms reported at this time. Patient currently in ER due to symptoms mentioned above.  Meaningful use complete.  Patient notified of 1:30pm-07/06/21 telephone appointment and verbalized understanding.

## 2021-07-03 NOTE — ED Notes (Signed)
Called lab to add on troponin

## 2021-07-03 NOTE — ED Triage Notes (Signed)
Pt d/c'd from post surgical rehab (left lung lobectomy and brain tumor) on Friday.  Since then, decreased appetite, SOB especially with exertion and feeling very fatigued.

## 2021-07-03 NOTE — ED Notes (Signed)
Family informed of radiology protocol for benadryl to be given at 2015 and then ct scan to be performed.

## 2021-07-03 NOTE — Discharge Instructions (Signed)

## 2021-07-03 NOTE — ED Notes (Signed)
Pt placed on [place] at [employer] [facility].

## 2021-07-03 NOTE — ED Notes (Signed)
Pt arrives to room in wheelchair. While transferring to bed, her O2 sat dropped into upper 80's. Pt has been in rehab and has been doing well, but began to have cough on Friday and has become weaker and sob since then. Pt is alert & oriented, nad noted.

## 2021-07-03 NOTE — ED Notes (Signed)
Pt placed on [place] 

## 2021-07-03 NOTE — Telephone Encounter (Signed)
(  3:38pm)  SW attempted to schedule initial palliative care visit with patient's daughter-Shannon. Larene Beach advised that patient was currently in the ER at [facility] and she did not know if patient would be admitted or not. Larene Beach advised that she will follow-up with SW when she can schedule a visit.

## 2021-07-04 ENCOUNTER — Encounter (HOSPITAL_COMMUNITY): Payer: Self-pay | Admitting: Family Medicine

## 2021-07-04 DIAGNOSIS — R0603 Acute respiratory distress: Secondary | ICD-10-CM | POA: Diagnosis not present

## 2021-07-04 DIAGNOSIS — Z79899 Other long term (current) drug therapy: Secondary | ICD-10-CM | POA: Diagnosis not present

## 2021-07-04 DIAGNOSIS — Y95 Nosocomial condition: Secondary | ICD-10-CM | POA: Diagnosis present

## 2021-07-04 DIAGNOSIS — J9601 Acute respiratory failure with hypoxia: Secondary | ICD-10-CM | POA: Diagnosis present

## 2021-07-04 DIAGNOSIS — E7849 Other hyperlipidemia: Secondary | ICD-10-CM | POA: Diagnosis present

## 2021-07-04 DIAGNOSIS — M21372 Foot drop, left foot: Secondary | ICD-10-CM | POA: Diagnosis present

## 2021-07-04 DIAGNOSIS — F39 Unspecified mood [affective] disorder: Secondary | ICD-10-CM | POA: Diagnosis present

## 2021-07-04 DIAGNOSIS — J189 Pneumonia, unspecified organism: Secondary | ICD-10-CM | POA: Diagnosis present

## 2021-07-04 DIAGNOSIS — I2690 Septic pulmonary embolism without acute cor pulmonale: Secondary | ICD-10-CM | POA: Diagnosis not present

## 2021-07-04 DIAGNOSIS — Z96652 Presence of left artificial knee joint: Secondary | ICD-10-CM | POA: Diagnosis present

## 2021-07-04 DIAGNOSIS — C7931 Secondary malignant neoplasm of brain: Secondary | ICD-10-CM

## 2021-07-04 DIAGNOSIS — Z7189 Other specified counseling: Secondary | ICD-10-CM | POA: Diagnosis not present

## 2021-07-04 DIAGNOSIS — C3412 Malignant neoplasm of upper lobe, left bronchus or lung: Secondary | ICD-10-CM | POA: Diagnosis present

## 2021-07-04 DIAGNOSIS — Z923 Personal history of irradiation: Secondary | ICD-10-CM | POA: Diagnosis not present

## 2021-07-04 DIAGNOSIS — Z6832 Body mass index (BMI) 32.0-32.9, adult: Secondary | ICD-10-CM | POA: Diagnosis not present

## 2021-07-04 DIAGNOSIS — I2609 Other pulmonary embolism with acute cor pulmonale: Secondary | ICD-10-CM | POA: Diagnosis not present

## 2021-07-04 DIAGNOSIS — I82452 Acute embolism and thrombosis of left peroneal vein: Secondary | ICD-10-CM | POA: Diagnosis present

## 2021-07-04 DIAGNOSIS — Z853 Personal history of malignant neoplasm of breast: Secondary | ICD-10-CM | POA: Diagnosis not present

## 2021-07-04 DIAGNOSIS — Z87891 Personal history of nicotine dependence: Secondary | ICD-10-CM | POA: Diagnosis not present

## 2021-07-04 DIAGNOSIS — Z515 Encounter for palliative care: Secondary | ICD-10-CM | POA: Diagnosis not present

## 2021-07-04 DIAGNOSIS — Z20822 Contact with and (suspected) exposure to covid-19: Secondary | ICD-10-CM | POA: Diagnosis present

## 2021-07-04 DIAGNOSIS — E669 Obesity, unspecified: Secondary | ICD-10-CM | POA: Diagnosis present

## 2021-07-04 DIAGNOSIS — Z96643 Presence of artificial hip joint, bilateral: Secondary | ICD-10-CM | POA: Diagnosis present

## 2021-07-04 DIAGNOSIS — Z882 Allergy status to sulfonamides status: Secondary | ICD-10-CM | POA: Diagnosis not present

## 2021-07-04 DIAGNOSIS — I82432 Acute embolism and thrombosis of left popliteal vein: Secondary | ICD-10-CM | POA: Diagnosis present

## 2021-07-04 DIAGNOSIS — Z888 Allergy status to other drugs, medicaments and biological substances status: Secondary | ICD-10-CM | POA: Diagnosis not present

## 2021-07-04 DIAGNOSIS — Z902 Acquired absence of lung [part of]: Secondary | ICD-10-CM | POA: Diagnosis not present

## 2021-07-04 DIAGNOSIS — I2699 Other pulmonary embolism without acute cor pulmonale: Secondary | ICD-10-CM | POA: Diagnosis present

## 2021-07-04 DIAGNOSIS — Z885 Allergy status to narcotic agent status: Secondary | ICD-10-CM | POA: Diagnosis not present

## 2021-07-04 DIAGNOSIS — I82442 Acute embolism and thrombosis of left tibial vein: Secondary | ICD-10-CM | POA: Diagnosis present

## 2021-07-04 LAB — EXTRA SPECIMEN

## 2021-07-04 LAB — URINALYSIS, ROUTINE W REFLEX MICROSCOPIC
Bilirubin Urine: NEGATIVE
Glucose, UA: NEGATIVE mg/dL
Hgb urine dipstick: NEGATIVE
Ketones, ur: NEGATIVE mg/dL
Leukocytes,Ua: NEGATIVE
Nitrite: NEGATIVE
Protein, ur: NEGATIVE mg/dL
Specific Gravity, Urine: 1.038 — ABNORMAL HIGH (ref 1.005–1.030)
pH: 6.5 (ref 5.0–8.0)

## 2021-07-04 LAB — GLUCOSE, CAPILLARY
Glucose-Capillary: 134 mg/dL — ABNORMAL HIGH (ref 70–99)
Glucose-Capillary: 195 mg/dL — ABNORMAL HIGH (ref 70–99)

## 2021-07-04 LAB — D-DIMER, QUANTITATIVE

## 2021-07-04 MED ORDER — BISACODYL 5 MG PO TBEC: 5.0000 mg | DELAYED_RELEASE_TABLET | Freq: Every day | ORAL | Status: DC | PRN

## 2021-07-04 MED ORDER — SODIUM CHLORIDE 0.9 % IV SOLN
2.0000 g | INTRAVENOUS | Status: DC
  Administered 2021-07-04 – 2021-07-05 (×2): 2 g via INTRAVENOUS
  Filled 2021-07-04 (×3): qty 20

## 2021-07-04 MED ORDER — MORPHINE SULFATE (PF) 2 MG/ML IV SOLN: 2.0000 mg | INTRAVENOUS | Status: DC | PRN

## 2021-07-04 MED ORDER — ALBUTEROL SULFATE (2.5 MG/3ML) 0.083% IN NEBU
2.5000 mg | INHALATION_SOLUTION | RESPIRATORY_TRACT | Status: DC | PRN
  Administered 2021-07-04 – 2021-07-05 (×2): 2.5 mg via RESPIRATORY_TRACT
  Filled 2021-07-04 (×2): qty 3

## 2021-07-04 MED ORDER — GABAPENTIN 300 MG PO CAPS
600.0000 mg | ORAL_CAPSULE | Freq: Every day | ORAL | Status: DC
  Administered 2021-07-04 – 2021-07-05 (×2): 600 mg via ORAL
  Filled 2021-07-04 (×2): qty 2

## 2021-07-04 MED ORDER — POLYETHYLENE GLYCOL 3350 17 G PO PACK: 17.0000 g | PACK | Freq: Every day | ORAL | Status: DC | PRN

## 2021-07-04 MED ORDER — MELATONIN 3 MG PO TABS
3.0000 mg | ORAL_TABLET | Freq: Every day | ORAL | Status: DC
  Administered 2021-07-04 – 2021-07-05 (×2): 3 mg via ORAL
  Filled 2021-07-04 (×2): qty 1

## 2021-07-04 MED ORDER — ONDANSETRON HCL 4 MG/2ML IJ SOLN: 4.0000 mg | Freq: Four times a day (QID) | INTRAMUSCULAR | Status: DC | PRN

## 2021-07-04 MED ORDER — ROPINIROLE HCL 1 MG PO TABS
4.0000 mg | ORAL_TABLET | Freq: Every day | ORAL | Status: DC
  Administered 2021-07-04 – 2021-07-05 (×2): 4 mg via ORAL
  Filled 2021-07-04 (×2): qty 4

## 2021-07-04 MED ORDER — BUSPIRONE HCL 15 MG PO TABS: 7.5000 mg | ORAL_TABLET | Freq: Two times a day (BID) | ORAL | Status: DC | PRN

## 2021-07-04 MED ORDER — ACETAMINOPHEN 650 MG RE SUPP: 650.0000 mg | Freq: Four times a day (QID) | RECTAL | Status: DC | PRN

## 2021-07-04 MED ORDER — ACETAMINOPHEN 325 MG PO TABS: 650.0000 mg | ORAL_TABLET | Freq: Four times a day (QID) | ORAL | Status: DC | PRN

## 2021-07-04 MED ORDER — SODIUM CHLORIDE 0.9% FLUSH
3.0000 mL | Freq: Two times a day (BID) | INTRAVENOUS | Status: DC
  Administered 2021-07-05 (×2): 3 mL via INTRAVENOUS

## 2021-07-04 MED ORDER — HYDRALAZINE HCL 20 MG/ML IJ SOLN: 5.0000 mg | INTRAMUSCULAR | Status: DC | PRN

## 2021-07-04 MED ORDER — HYDROCODONE-ACETAMINOPHEN 5-325 MG PO TABS: 1.0000 | ORAL_TABLET | ORAL | Status: DC | PRN

## 2021-07-04 MED ORDER — SODIUM CHLORIDE 0.9 % IV SOLN
100.0000 mg | Freq: Two times a day (BID) | INTRAVENOUS | Status: DC
  Administered 2021-07-04 – 2021-07-06 (×4): 100 mg via INTRAVENOUS
  Filled 2021-07-04 (×5): qty 100

## 2021-07-04 MED ORDER — DOCUSATE SODIUM 100 MG PO CAPS
100.0000 mg | ORAL_CAPSULE | Freq: Two times a day (BID) | ORAL | Status: DC
  Administered 2021-07-04 – 2021-07-06 (×4): 100 mg via ORAL
  Filled 2021-07-04 (×4): qty 1

## 2021-07-04 MED ORDER — ONDANSETRON HCL 4 MG PO TABS: 4.0000 mg | ORAL_TABLET | Freq: Four times a day (QID) | ORAL | Status: DC | PRN

## 2021-07-04 MED ORDER — OMEPRAZOLE 20 MG PO CPDR
20.0000 mg | DELAYED_RELEASE_CAPSULE | Freq: Every day | ORAL | Status: DC | PRN
  Filled 2021-07-04: qty 1

## 2021-07-04 NOTE — ED Notes (Signed)
Care Handoff/Report given to Oncoming RN at this Time.

## 2021-07-04 NOTE — Plan of Care (Signed)

## 2021-07-04 NOTE — TOC Benefit Eligibility Note (Signed)
Transition of Care [facility]) Benefit Eligibility Note    Patient Details  Name: Tara Murphy MRN: [id number] Date of Birth: 10-05-1945   Medication/Dose: ELIQUIS 2.5 MG BID  : CO-PAY- $ 42.00   and  ELIQUIS  5 MG BID : CO-PAY- $42.00  Covered?: Yes  Tier: 3 Drug  Prescription Coverage Preferred Pharmacy: [facility] with Person/Company/Phone Number:: [id number]  @  [facility] #  [phone]  Co-Pay: $ 42.00  Prior Approval: No  Deductible: Unmet (OUT-OF-POCKET:UNMET)       Memory Argue Phone Number: 07/04/2021, 4:59 PM

## 2021-07-04 NOTE — Progress Notes (Signed)
[place] [address] [employer] [facility]) Hospital Liaison note:  This patient is currently enrolled in [facility] outpatient-based Palliative Care. Will continue to follow for disposition.  Please call with any outpatient palliative questions or concerns.  Thank you, Lorelee Market, LPN [facility] Liaison [phone]

## 2021-07-04 NOTE — ED Notes (Signed)
Patient made aware of Assigned Bed at [facility]. Patient signed Transfer Form willingly.

## 2021-07-04 NOTE — Progress Notes (Signed)
Pt hospitalized with PE and pneumonia. Chart reviewed.

## 2021-07-04 NOTE — H&P (Addendum)
History and Physical    Tara Murphy UKG:[id number] DOB: 20-Oct-1945 DOA: 07/03/2021  PCP: Leamon Arnt, MD Consultants:  [facility] - oncology; [facility] - CT surgery; [facility] - neurosurgery; [facility] - rad onc Patient coming from:  Home - lives with husband;  NOK: Daughter. Buddy Duty, [phone]  Chief Complaint: SOB  HPI: Tara Murphy is a 75 y.o. female with medical history significant of remote breast cancer and recent lung cancer with mets to brain s/p lobectomy (06/13/21, poorly differentiated NSCLC) and craniotomy for tumor resection (04/30/21, poorly differentiated metastatic lung adenocarcinoma) presenting with SOB.   Her cancer was diagnosed on admission from 8/2-11 when she presented with seizures and was found to have brain meds.  She was discharged on Friday from CIR and had started coughing prior to d/c.  Clear lungs, no respiratory distress, she was given Robitussin.  The last PT sessions required more effort than prior.  She went home and realized that she was not as strong as she suspected, decreased stamina.  It was harder to do everything.  She slid off the bed over the weekend, it was too difficult to stand and had to have EMS come lift her up.  The next day, she felt even more weak.  Increased cough.  Yesterday AM, she took a shower and it was too difficult to function afterwards and so she went to the ER.  She was feeling so rotten that she called her PCP and was seen.  She was made arrangements for a CXR but didn't make it and decided to come to the ER instead.  No prior h/o clots.  No fever.  She was too tired to stay awake at the dinner table, absolute fatigue.  Cough is nonproductive.   ED Course: [facility] transfer, per Dr. Tonie Griffith:  75 yo female with hx of breast cancer with mets to lung who had Left upper lobe lobectomy 3 weeks ago. Presented with SOB. Found to have PE on CT chest and multifocal pneumonia. No right heart strain. Placed on Eliquis for  anticoagulation. Is on 2 L/min of O2 by n/c. Will need echo in am. Started on Rocephin and doxycycline for multifocal pneumonia  Review of Systems: As per HPI; otherwise review of systems reviewed and negative.   Ambulatory Status:  Ambulates with a walker  COVID Vaccine Status:  Complete plus booster  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[place])    S/p total hip replacement left   Breast cancer ([place]) 2015   left    Cataract    Cholecystolithiasis    Chronic allergic rhinitis    Depression    Former smoker, stopped smoking in distant past 10/05/2019   Quit 2000; 91 y smoking history   GERD (gastroesophageal reflux disease)    Hepatic steatosis 06/08/2020   Intermittent elevated LFTs and ultrasound 2018   Hyperlipemia    IBS (irritable bowel syndrome) 10/08/2018   Nl colonoscopy and EGD 09/2018   IBS (irritable bowel syndrome)    Lichen plano-pilaris    Major depression, 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 Repeat 2020   Urinary  tract infection    hx of frequent     Past Surgical History:  Procedure Laterality Date   APPENDECTOMY     APPLICATION OF CRANIAL NAVIGATION Right 04/30/2021   Procedure: APPLICATION OF CRANIAL NAVIGATION;  Surgeon: Earnie Larsson, MD;  Location: [place];  Service: Neurosurgery;  Laterality: Right;   BREAST LUMPECTOMY Left 2015   CATARACT EXTRACTION Bilateral    cataracts     CHOLECYSTECTOMY N/A 10/29/2016   Procedure: LAPAROSCOPIC CHOLECYSTECTOMY;  Surgeon: Erroll Luna, MD;  Location: [place];  Service: General;  Laterality: N/A;   COLONOSCOPY     CRANIOTOMY Right 04/30/2021   Procedure: Right Parietal  Craniotomy for tumor with Brain Lab;  Surgeon: Earnie Larsson, MD;  Location: [place];  Service: Neurosurgery;  Laterality: Right;   EYE SURGERY     INTERCOSTAL NERVE BLOCK Left 06/13/2021   Procedure: INTERCOSTAL NERVE BLOCK;  Surgeon: Melrose Nakayama, MD;  Location: [place];  Service: Thoracic;  Laterality: Left;   LIPOSUCTION     LYMPH NODE DISSECTION Left 06/13/2021   Procedure: LYMPH NODE DISSECTION;  Surgeon: Melrose Nakayama, MD;  Location: [place];  Service: Thoracic;  Laterality: Left;   TOTAL HIP ARTHROPLASTY     bilateral hip arthoplasty   TOTAL HIP ARTHROPLASTY Right 12/18/2015   Procedure: RIGHT TOTAL HIP ARTHROPLASTY ANTERIOR APPROACH;  Surgeon: Gaynelle Arabian, MD;  Location: [facility];  Service: Orthopedics;  Laterality: Right;   TOTAL KNEE ARTHROPLASTY     TOTAL KNEE ARTHROPLASTY Left 11/25/2016   Procedure: LEFT TOTAL KNEE ARTHROPLASTY;  Surgeon: Gaynelle Arabian, MD;  Location: [facility];  Service: Orthopedics;  Laterality: Left;  with abductor block   TUBAL LIGATION     UPPER GASTROINTESTINAL ENDOSCOPY      Social History   Socioeconomic History   Marital status: Married    Spouse name: Not on file   Number of children: 2   Years of education: Not on file   Highest education level: Not on file  Occupational History   Occupation: Retired     Comment: Oceanographer   Tobacco Use   Smoking status: Former    Packs/day: 1.00    Types: Cigarettes    Quit date: 11/22/1991    Years since quitting: 29.6   Smokeless tobacco: Never  Vaping Use   Vaping Use: Never used  Substance and Sexual Activity   Alcohol use: Yes    Alcohol/week: 14.0 standard drinks    Types: 14 Glasses of wine per week    Comment: not lately   Drug use: No   Sexual activity: Not Currently    Birth control/protection: Post-menopausal  Other Topics Concern   Not on file  Social History Narrative   4 grandchildren and 3 step    Enjoys painting, and gardening, and home decorating    Right Handed    Has a [place] Puppy    Lives one story home    Social Determinants of Health   Financial Resource Strain: Not on file  Food Insecurity: Not on file  Transportation Needs: Not on file  Physical Activity: Not on file  Stress: Not on file  Social Connections: Not on file  Intimate Partner Violence: Not on file    Allergies  Allergen Reactions   Fluorescein Itching and Other (See Comments)   Contrast Media [Iodinated Diagnostic Agents] Hives   Hydromorphone Hcl Nausea Only   Other Rash    Certain Band-Aids don't agree with the patient's skin; hospital ID wristband causes  rashes   Sulfamethoxazole-Trimethoprim Hives   Fentanyl Other (See Comments)    Redness and flushing over body    Tape Rash    Certain Band-Aids don't agree with the patient's skin; hospital ID wristband causes rashes   Terbinafine And Related Hives and Rash    Family History  Problem Relation Age of Onset   Arthritis Mother    Diabetes Mother    Crohn's disease Mother    Arthritis Father    Lung cancer Father    Breast cancer Sister 14   Hyperlipidemia Maternal Aunt    Stomach cancer Maternal Grandmother    Colitis Neg Hx    Rectal cancer Neg Hx    Esophageal cancer Neg Hx     Prior to Admission medications   Medication Sig Start Date End Date Taking? Authorizing Provider  acetaminophen (TYLENOL) 325 MG tablet Take 1-2 tablets (325-650 mg total) by mouth every 4 (four) hours as needed for mild pain. 06/29/21   Love, Ivan Anchors, PA-C  busPIRone (BUSPAR) 7.5 MG tablet Take 1 tablet (7.5 mg total) by mouth 2 (two) times daily as needed (anxiety). 05/15/21   Angiulli, Lavon Paganini, PA-C  clobetasol (TEMOVATE) 0.05 % external solution Apply 1 application topically daily as needed (irritation).    [provider]  fluticasone (FLONASE) 50 MCG/ACT nasal spray Place 1 spray into both nostrils daily as needed for allergies or rhinitis.    [provider]  gabapentin (NEURONTIN) 300 MG capsule Take 2  capsules (600 mg total) by mouth at bedtime. 06/29/21   Love, Ivan Anchors, PA-C  melatonin 3 MG TABS tablet Take 1 tablet (3 mg total) by mouth at bedtime. 06/29/21   Love, Ivan Anchors, PA-C  rOPINIRole (REQUIP) 4 MG tablet Take 1 tablet (4 mg total) by mouth at bedtime. 05/15/21   Angiulli, Lavon Paganini, PA-C  atorvastatin (LIPITOR) 10 MG tablet Take 1 tablet (10 mg total) by mouth 3 (three) times a week. Patient not taking: No sig reported 06/07/20 04/30/21  Leamon Arnt, MD    Physical Exam: Vitals:   07/04/21 0700 07/04/21 0855 07/04/21 1200 07/04/21 1655  BP: 128/70 135/67 102/85 117/66  Pulse: 77  99 (!) 101  Resp: (!) 22  15 20   Temp:  97.7 F (36.5 C) 97.7 F (36.5 C) 98.2 F (36.8 C)  TempSrc:  Axillary Oral Oral  SpO2: 99% 96% 95% 96%  Weight:      Height:         General:  Appears calm and comfortable and is in NAD Eyes:   EOMI, normal lids, iris ENT:  grossly normal hearing, lips & tongue, mmm; appropriate dentition Neck:  no LAD, masses or thyromegaly Cardiovascular:  RRR, no m/r/g. No LE edema.  Respiratory:   CTA bilaterally with no wheezes/rales/rhonchi.  Mildly increased respiratory effort, on [place] O2. Abdomen:  soft, NT, ND Skin:  no rash or induration seen on limited exam Musculoskeletal:  diminished tone in LLE > RLE; no bony abnormality Psychiatric:  grossly normal mood and affect, speech fluent and appropriate, AOx3 Neurologic:  CN 2-12 grossly intact, moves all extremities in coordinated fashion    Radiological Exams on Admission: Independently reviewed - see discussion in A/P where applicable  DG Chest 2 View  Result Date: 07/03/2021 CLINICAL DATA:  Shortness of breath and fatigue. EXAM: CHEST - 2 VIEW COMPARISON:  Chest x-ray 06/17/2021, CT chest 04/25/2021 FINDINGS: The heart and mediastinal contours are unchanged. Status post left upper lobectomy. No pulmonary  edema. Possible increase in trace to small volume left pleural effusion. No pneumothorax. No acute  osseous abnormality. IMPRESSION: Possible increase in trace to small volume left pleural effusion in a patient status post left upper lobectomy. Electronically Signed   By: Iven Finn M.D.   On: 07/03/2021 15:16   CT Angio Chest PE W/Cm &/Or Wo Cm  Result Date: 07/03/2021 CLINICAL DATA:  PE suspected, high prob EXAM: CT ANGIOGRAPHY CHEST WITH CONTRAST TECHNIQUE: Multidetector CT imaging of the chest was performed using the standard protocol during bolus administration of intravenous contrast. Multiplanar CT image reconstructions and MIPs were obtained to evaluate the vascular anatomy. CONTRAST:  69mL OMNIPAQUE IOHEXOL 350 MG/ML SOLN COMPARISON:  Same day radiograph, PET-CT 06/01/2021, chest CT 04/25/2021 FINDINGS: Cardiovascular: Mildly enlarged heart. Coronary artery calcifications. No pericardial disease. Normal size main pulmonary artery. The branch pulmonary arteries are mildly enlarged with tortuosity, which can be seen in chronic pulmonary hypertension. There are pulmonary emboli in the right lower lobar segmental arteries extending into the subsegmental branches. RV: LV ratio is 0.9. No evidence of right heart strain. Mediastinum/Nodes: Prominent hilar and mediastinal lymph nodes, favored to be reactive. The thyroid is unremarkable. Esophagus is unremarkable. Lungs/Pleura: Postsurgical changes of left upper lobectomy. The remaining airways are patent. There is mild bronchial wall thickening. There are patchy ground-glass opacities bilaterally, worst in the mid to upper lungs, new since prior chest CT in August. Associated mild interlobular septal thickening. Unchanged 8 mm right upper lobe pulmonary nodule (series 6, image 45). Moderate size left pleural effusion with adjacent basilar atelectasis. Upper Abdomen: No acute findings. Musculoskeletal: No acute osseous abnormality. No suspicious lytic or blastic lesions. Review of the MIP images confirms the above findings. IMPRESSION: Right lower lobe  segmental pulmonary emboli, extending into the subsegmental branches. No evidence of right heart strain. Multifocal ground-glass opacities bilaterally, worst in the mid to upper lungs, new since prior PET-CT in September, consistent with multifocal pneumonia. Appearance can be also seen with atypical viral infection including COVID-19. Recommend follow-up to resolution. Postsurgical changes of left upper lobectomy, including a moderate size left pleural effusion. Unchanged 8 mm right upper lobe pulmonary nodule. Recommend continued oncologic follow-up with follow-up chest CT as clinically indicated. These results were called by telephone at the time of interpretation on 07/03/2021 at 9:37 pm to ED provider, who verbally acknowledged these results. Electronically Signed   By: Maurine Simmering M.D.   On: 07/03/2021 21:43    EKG: Independently reviewed.  Sinus tachycardia with rate 103; nonspecific ST changes with no evidence of acute ischemia   Labs on Admission: I have personally reviewed the available labs and imaging studies at the time of the admission.  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 Significant  expense with DOAC therapy -Will request TOC team consultation to assist with cost analysis of the various DOAC options based on his insurance -Patients with active cancer; antiphospholipid syndrome; or prior history of VTE with recurrence are at high risk (>8%/year) for recurrent VTE and should be maintained on indefinite AC. -The patient understands that thromboembolic disease can be catastrophic and even deadly and that she must be complaint with physician appointments and lifelong anticoagulation. -Also with GGO on chest CT, continue Rocephin/Doxy  Metastatic lung cancer -Patient initially with seizures from brain mets diagnosed in April and s/p crainotomy and tumor resection -Primary lung cancer and had lobectomy during last hospitalization -Dr. 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 -Continue Keppra  Foot drop -DVT likely formed in LLE from relative immobility in the setting of known foot drop ([name] would be an academic exercise only and so not ordered) -Of utmost importance is that she be mobile and ambulatory if possible -She is at high fall risk and so will need to be cautious -Falls precautions -Up to chair q shift -PT/OT consults -Continue Neurontin -Also with RLS, continue Requip  Mood d/o -She was previously started on Buspar -She is likely to benefit from initiation of SSRI therapy -Continue Melatonin      Note: This patient has been tested and is negative for the novel coronavirus COVID-19. The patient has been fully vaccinated against COVID-19.   Level of care: Telemetry Medical DVT prophylaxis:  Eliquis Code Status:  Full -  confirmed with patient Family Communication: Husband was present throughout evaluation and I also spoke with her daughter around the time of admission Disposition Plan:  The patient is from: home  Anticipated d/c is to: home without [facility] services   Anticipated d/c date will depend on clinical response to treatment, likely 2-3 days  Patient is currently: acutely ill Consults called: Oncology - telephone only; palliative care; PT/OT/Nutrition/TOC team  Admission status:  Admit - It is my clinical opinion that admission to INPATIENT is reasonable and necessary because of the expectation that this patient will require hospital care that crosses at least 2 midnights to treat this condition based on the medical complexity of the problems presented.  Given the aforementioned information, the predictability of an adverse outcome is felt to be significant.    Karmen Bongo MD [facility]   How to contact the [facility] Attending or Consulting provider [place] or covering provider during after hours [place], for this patient?  Check the care team in [facility] and look for a) attending/consulting TR[facility] provider listed and b) the [facility] team listed Log into [email or web address] and use [place]'s universal password to access. If you do not have the password, please contact the hospital operator. Locate the [facility] provider you are looking for under [facility] and page to a number that you can be directly reached. If you still have difficulty reaching the provider, please page the [facility] (Director on Call) for the Hospitalists listed on amion for assistance.   07/04/2021, 5:58 PM

## 2021-07-04 NOTE — ED Notes (Signed)
Care Handoff/Report given to [facility] RN at this Time. All Questions Answered at this Time.

## 2021-07-04 NOTE — ED Notes (Signed)
[facility] called for Transport at this Time.

## 2021-07-05 ENCOUNTER — Ambulatory Visit: Payer: Self-pay | Admitting: Urology

## 2021-07-05 ENCOUNTER — Inpatient Hospital Stay (HOSPITAL_COMMUNITY): Payer: Medicare Other

## 2021-07-05 ENCOUNTER — Encounter: Payer: Medicare Other | Admitting: Physical Medicine and Rehabilitation

## 2021-07-05 DIAGNOSIS — C3412 Malignant neoplasm of upper lobe, left bronchus or lung: Secondary | ICD-10-CM | POA: Diagnosis not present

## 2021-07-05 DIAGNOSIS — I2690 Septic pulmonary embolism without acute cor pulmonale: Secondary | ICD-10-CM | POA: Diagnosis not present

## 2021-07-05 DIAGNOSIS — R0603 Acute respiratory distress: Secondary | ICD-10-CM

## 2021-07-05 DIAGNOSIS — Z7189 Other specified counseling: Secondary | ICD-10-CM

## 2021-07-05 DIAGNOSIS — I2609 Other pulmonary embolism with acute cor pulmonale: Secondary | ICD-10-CM

## 2021-07-05 DIAGNOSIS — Z515 Encounter for palliative care: Secondary | ICD-10-CM

## 2021-07-05 DIAGNOSIS — C7931 Secondary malignant neoplasm of brain: Secondary | ICD-10-CM | POA: Diagnosis not present

## 2021-07-05 DIAGNOSIS — M21372 Foot drop, left foot: Secondary | ICD-10-CM | POA: Diagnosis not present

## 2021-07-05 DIAGNOSIS — I2699 Other pulmonary embolism without acute cor pulmonale: Secondary | ICD-10-CM

## 2021-07-05 LAB — ECHOCARDIOGRAM COMPLETE
AR max vel: 1.83 cm2
AV Area VTI: 1.77 cm2
AV Area mean vel: 1.77 cm2
AV Mean grad: 22 mmHg
AV Peak grad: 30.6 mmHg
Ao pk vel: 2.77 m/s
Area-P 1/2: 4.63 cm2
Height: 66.75 in
S' Lateral: 2.3 cm
Weight: 3262.81 oz

## 2021-07-05 LAB — BASIC METABOLIC PANEL
Anion gap: 8 (ref 5–15)
BUN: 16 mg/dL (ref 8–23)
CO2: 23 mmol/L (ref 22–32)
Calcium: 8 mg/dL — ABNORMAL LOW (ref 8.9–10.3)
Chloride: 107 mmol/L (ref 98–111)
Creatinine, Ser: 0.64 mg/dL (ref 0.44–1.00)
GFR, Estimated: >60 mL/min (ref >60)
Glucose, Bld: 111 mg/dL — ABNORMAL HIGH (ref 70–99)
Potassium: 3.8 mmol/L (ref 3.5–5.1)
Sodium: 138 mmol/L (ref 135–145)

## 2021-07-05 LAB — CBC
HCT: 27.9 % — ABNORMAL LOW (ref 36.0–46.0)
Hemoglobin: 8.9 g/dL — ABNORMAL LOW (ref 12.0–15.0)
MCH: 30 pg (ref 26.0–34.0)
MCHC: 31.9 g/dL (ref 30.0–36.0)
MCV: 93.9 fL (ref 80.0–100.0)
Platelets: 274 10*3/uL (ref 150–400)
RBC: 2.97 MIL/uL — ABNORMAL LOW (ref 3.87–5.11)
RDW: 15.5 % (ref 11.5–15.5)
WBC: 6.7 10*3/uL (ref 4.0–10.5)
nRBC: 0 % (ref 0.0–0.2)

## 2021-07-05 LAB — PROCALCITONIN: Procalcitonin: <0.1 ng/mL

## 2021-07-05 MED ORDER — ADULT MULTIVITAMIN W/MINERALS CH
1.0000 | ORAL_TABLET | Freq: Every day | ORAL | Status: DC
  Administered 2021-07-06: 1 via ORAL
  Filled 2021-07-05: qty 1

## 2021-07-05 MED ORDER — ENSURE MAX PROTEIN PO LIQD
11.0000 [oz_av] | Freq: Every day | ORAL | Status: DC
  Filled 2021-07-05 (×2): qty 330

## 2021-07-05 NOTE — Progress Notes (Signed)
Initial Nutrition Assessment  DOCUMENTATION CODES:   Not applicable  INTERVENTION:   - Ensure Max po daily, each supplement provides 150 kcal and 30 grams of protein  - MVI with minerals daily  - Encourage PO intake  NUTRITION DIAGNOSIS:   Increased nutrient needs related to cancer and cancer related treatments, acute illness as evidenced by estimated needs.  GOAL:   Patient will meet greater than or equal to 90% of their needs  MONITOR:   PO intake, Supplement acceptance, Labs, Weight trends, I & O's  REASON FOR ASSESSMENT:   Consult Other (nutritional goals)  ASSESSMENT:   75 year old female who presented to the ED on 10/11 with SOB. PMH of remote breast cancer, recent lung cancer with mets to brain s/p lobectomy on 06/13/21 (poorly differentiated NSCLC) and craniotomy for tumor resection on 04/30/21 (poorly differentiated metastatic lung adenocarcinoma), depression, GERD, HLD, IBS. Pt admitted with pulmonary embolism, pneumonia.  Spoke with pt and daughter at bedside. Pt reports good appetite. Pt states that she does not love the hospital food but that it is edible. Noted pt with 100% completed lunch meal tray at bedside which pt reported was "not bad." Pt with recent CIR admission. Pt states that she ate well during this time. Pt was home briefly and family and friends were sending lots of food.  Pt reports that she has gained some weight since being in the hospital. She attributes this to being on steroids and being less mobile. Reviewed weight history in chart. Pt has actually had some weight loss since July 2022. Overall, pt with a 7.3 kg weight loss since 04/13/21. This is a 7.3% weight loss in 2.5 months which is significant for timeframe.  Pt amenable to oral nutrition supplements. Discussed pt's increased nutrient needs related to healing. Also discussed need for increased protein intake to maintain lean muscle mass.  Medications reviewed and include: colace,  melatonin, IV abx  Labs reviewed: hemoglobin 8.9 CBG's: 134-195 x 24 hours  NUTRITION - FOCUSED PHYSICAL EXAM:  Flowsheet Row Most Recent Value  Orbital Region Mild depletion  Upper Arm Region No depletion  Thoracic and Lumbar Region No depletion  Buccal Region No depletion  Temple Region No depletion  Clavicle Bone Region Mild depletion  Clavicle and Acromion Bone Region Mild depletion  Scapular Bone Region No depletion  Dorsal Hand No depletion  Patellar Region No depletion  Anterior Thigh Region Mild depletion  Posterior Calf Region Mild depletion  Edema (RD Assessment) Mild  [LLE]  Hair Reviewed  Eyes Reviewed  Mouth Reviewed  Skin Reviewed  Nails Reviewed       Diet Order:   Diet Order             Diet regular Room service appropriate? Yes; Fluid consistency: Thin  Diet effective now                   EDUCATION NEEDS:   Education needs have been addressed  Skin:  Skin Assessment: Skin Integrity Issues: Incisions: chest, head  Last BM:  no documented BM  Height:   Ht Readings from Last 1 Encounters:  07/03/21 5' 6.75" (1.695 m)    Weight:   Wt Readings from Last 1 Encounters:  07/03/21 92.5 kg    BMI:  Body mass index is 32.18 kg/m.  Estimated Nutritional Needs:   Kcal:  1700-1900  Protein:  90-110 grams  Fluid:  1.7-1.9 L    Gustavus Bryant, MS, RD, LDN Inpatient Clinical Dietitian Please see  [facility] for contact information.

## 2021-07-05 NOTE — Progress Notes (Signed)
Lower extremity venous bilateral study completed.  Preliminary results relayed to Doristine Bosworth, MD.  See CV Proc for preliminary results report.   Darlin Coco, RDMS, RVT

## 2021-07-05 NOTE — Progress Notes (Signed)
  Echocardiogram 2D Echocardiogram has been performed.  Tara Murphy F 07/05/2021, 10:00 AM

## 2021-07-05 NOTE — Evaluation (Signed)
Physical Therapy Evaluation Patient Details Name: Tara Murphy MRN: [id number] DOB: 1946/02/06 Today's Date: 07/05/2021  History of Present Illness  The pt is a 75 yo female presenting 10/11 with c/o SOB and nonproductive cough following d/c from CIR on 10/07. CT revealed PE and multifocal PNA. Pt also found with blood clot in LE. PMH includes: breast cancer (remote), lung cancer with brain mets s/p lobectomy 9/22 and crani 8/22 for tumor resection, bilateral THA, HLD, depression, neuropathy, and osteopenia.   Clinical Impression  Pt in bed upon arrival of PT, agreeable to evaluation at this time. Prior to admission the pt had progressed to ambulation with use of rollator at home for both inside home and community distances. The pt now presents with limitations in functional mobility, activity tolerance, endurance, and strength due to above dx, and will continue to benefit from skilled PT to address these deficits. The pt was able to demo good power and stability to complete sit-stand transfers without assist, but did need increased cues for posture, self-monitoring of SOB and exertion with ambulation.   SpO2 94% on 2L at rest, dropped to 85% on 2L with ambulation, maintained 89-92% on 3L with hallway ambulation and standing rest.  HR 120-126bpm with ambulation        Recommendations for follow up therapy are one component of a multi-disciplinary discharge planning process, led by the attending physician.  Recommendations may be updated based on patient status, additional functional criteria and insurance authorization.  Follow Up Recommendations Outpatient PT;Supervision for mobility/OOB    Equipment Recommendations  None recommended by PT    Recommendations for Other Services       Precautions / Restrictions Precautions Precautions: Fall Precaution Comments: left foot drop, monitor O2 Restrictions Weight Bearing Restrictions: No      Mobility  Bed Mobility Overal bed mobility:  Modified Independent Bed Mobility: Supine to Sit     Supine to sit: Modified independent (Device/Increase time)     General bed mobility comments: pt OOB at start and end of session    Transfers Overall transfer level: Needs assistance Equipment used: Rolling walker (2 wheeled) Transfers: Sit to/from Stand Sit to Stand: Min guard Stand pivot transfers: Min guard       General transfer comment: minG to power up from chair and recliner, no cues needed for hand placement  Ambulation/Gait Ambulation/Gait assistance: Min guard (chair follow) Gait Distance (Feet): 150 Feet Assistive device: Rolling walker (2 wheeled) Gait Pattern/deviations: Step-through pattern;Decreased stance time - left;Decreased dorsiflexion - left;Decreased weight shift to left Gait velocity: reduced Gait velocity interpretation: <1.31 ft/sec, indicative of household ambulator General Gait Details: pt cues for self-monitoring of fatigue, but able to complete with VSS taking standing rest break every 25-30 ft. no overt LOB, heavy trunk flexion and reliance on BUE support      Balance Overall balance assessment: Needs assistance Sitting-balance support: Feet supported;No upper extremity supported Sitting balance-Leahy Scale: Good     Standing balance support: During functional activity Standing balance-Leahy Scale: Fair Standing balance comment: fair static standing at sink, use of BUE support for mobility                             Pertinent Vitals/Pain Pain Assessment: No/denies pain Pain Intervention(s): Monitored during session    Home Living Family/patient expects to be discharged to:: Private residence Living Arrangements: Spouse/significant other Available Help at Discharge: Family;Available 24 hours/day Type of Home: House Home Access: Stairs  to enter Entrance Stairs-Rails: None Entrance Stairs-Number of Steps: 1 Home Layout: One level Home Equipment: Walker - 4 wheels;Grab  bars - toilet;Shower seat - built Investment banker, operational      Prior Function Level of Independence: Needs assistance   Gait / Transfers Assistance Needed: used rollator for mobility since illness. previously, no use of AD  ADL's / Homemaking Assistance Needed: prior to initial illness onset, pt completely independent then began to require assist for LB ADLs. recent DC from CIR at MOD I level for ADL/basic IADLs        Hand Dominance   Dominant Hand: Right    Extremity/Trunk Assessment   Upper Extremity Assessment Upper Extremity Assessment: Defer to OT evaluation    Lower Extremity Assessment Lower Extremity Assessment: Generalized weakness;LLE deficits/detail LLE Deficits / Details: L foot drop    Cervical / Trunk Assessment Cervical / Trunk Assessment: Normal  Communication   Communication: No difficulties  Cognition Arousal/Alertness: Awake/alert Behavior During Therapy: WFL for tasks assessed/performed Overall Cognitive Status: Within Functional Limits for tasks assessed                                 General Comments: pleasant and motivated      General Comments General comments (skin integrity, edema, etc.): daughter present and supportive. SpO2 steady on 2L at rest (94-98%) but dropping to 85% with ambulation, able to maintain 89-92% on 3L with ambulation    Exercises     Assessment/Plan    PT Assessment Patient needs continued PT services  PT Problem List Decreased strength;Decreased activity tolerance;Decreased mobility;Decreased balance;Decreased coordination;Cardiopulmonary status limiting activity;Decreased skin integrity;Pain       PT Treatment Interventions DME instruction;Gait training;Functional mobility training;Therapeutic activities;Therapeutic exercise;Balance training;Neuromuscular re-education;Patient/family education    PT Goals (Current goals can be found in the Care Plan section)  Acute Rehab PT Goals Patient Stated Goal:  to get stronger and get home more confident with walking and self care, "to cook a meal"; be able to go in kitchen to make a cup of tea PT Goal Formulation: With patient/family Time For Goal Achievement: 07/19/21 Potential to Achieve Goals: Good    Frequency Min 3X/week   Barriers to discharge Inaccessible home environment;Decreased caregiver support         AM-PAC PT "6 Clicks" Mobility  Outcome Measure Help needed turning from your back to your side while in a flat bed without using bedrails?: None Help needed moving from lying on your back to sitting on the side of a flat bed without using bedrails?: None Help needed moving to and from a bed to a chair (including a wheelchair)?: A Little Help needed standing up from a chair using your arms (e.g., wheelchair or bedside chair)?: A Little Help needed to walk in hospital room?: A Little Help needed climbing 3-5 steps with a railing? : A Lot 6 Click Score: 19    End of Session Equipment Utilized During Treatment: Gait belt;Oxygen Activity Tolerance: Patient tolerated treatment well Patient left: in chair;with call bell/phone within reach Nurse Communication: Mobility status PT Visit Diagnosis: Unsteadiness on feet (R26.81);Muscle weakness (generalized) (M62.81);Repeated falls (R29.6)    Time: 3086-5784 PT Time Calculation (min) (ACUTE ONLY): 20 min   Charges:   PT Evaluation $PT Eval Moderate Complexity: 1 Mod          West Carbo, PT, DPT   Acute Rehabilitation Department Pager #: [phone]  Moyses Pavey  Ellis Savage 07/05/2021, 2:29 PM

## 2021-07-05 NOTE — Evaluation (Signed)
Occupational Therapy Evaluation Patient Details Name: Tara Murphy MRN: [id number] DOB: 27-May-1946 Today's Date: 07/05/2021   History of Present Illness The pt is a 75 yo female presenting 10/11 with c/o SOB and nonproductive cough following d/c from CIR on 10/07. CT revealed PE and multifocal PNA. Pt also found with blood clot in LE. PMH includes: breast cancer (remote), lung cancer with brain mets s/p lobectomy 9/22 and crani 8/22 for tumor resection, bilateral THA, HLD, depression, neuropathy, and osteopenia.   Clinical Impression   Pt presents with diagnoses above and recent DC home from CIR at Modified Independent level for ADLs, basic IADLs and mobility using Rollator. Pt presents now with deficits in cardiopulmonary tolerance impacting safe completion of daily tasks. Confirmed with MD ok for pt to mobilize prior to eval. Pt requires min guard for mobility with RW, Setup for UB ADLs and Min A for LB ADLs (due to know L LE weakness/foot drop). On RA, pt desats easily to 82% with short mobility across room and during ADLs. Pt recovers with > 2 min seated rest break or application of 2 L O2 to return to 94% while seated. Pt's daughter present and both report desire to return home and begin OP therapies as planned. Will continue to follow acutely to progress activity tolerance during ADLs/mobility.       Recommendations for follow up therapy are one component of a multi-disciplinary discharge planning process, led by the attending physician.  Recommendations may be updated based on patient status, additional functional criteria and insurance authorization.   Follow Up Recommendations  Outpatient OT;Supervision - Intermittent    Equipment Recommendations  None recommended by OT    Recommendations for Other Services       Precautions / Restrictions Precautions Precautions: Fall Precaution Comments: left foot drop, monitor O2 Restrictions Weight Bearing Restrictions: No      Mobility  Bed Mobility Overal bed mobility: Modified Independent Bed Mobility: Supine to Sit     Supine to sit: Modified independent (Device/Increase time)     General bed mobility comments: use of bed rail    Transfers Overall transfer level: Needs assistance Equipment used: Rolling walker (2 wheeled) Transfers: Sit to/from [facility] Sit to Stand: Min guard Stand pivot transfers: Min guard       General transfer comment: min guard for standing at bedside progressing to supervision for sit to stands from [facility] and chair at sink during ADLs. min guard for turning to toilet initially for safety    Balance Overall balance assessment: Needs assistance Sitting-balance support: Feet supported;No upper extremity supported Sitting balance-Leahy Scale: Good     Standing balance support: During functional activity Standing balance-Leahy Scale: Fair Standing balance comment: fair static standing at sink, use of BUE support for mobility                           ADL either performed or assessed with clinical judgement   ADL Overall ADL's : Needs assistance/impaired     Grooming: Sitting;Modified independent   Upper Body Bathing: Modified independent;Sitting Upper Body Bathing Details (indicate cue type and reason): seated at sink Lower Body Bathing: Supervison/ safety;Sit to/from stand Lower Body Bathing Details (indicate cue type and reason): to bathe peri region in standing Upper Body Dressing : Set up;Sitting   Lower Body Dressing: Minimal assistance;Sit to/from stand Lower Body Dressing Details (indicate cue type and reason): to don underwear around weaker L LE Toilet Transfer: Min guard;Ambulation;RW;BSC;Regular  Glass blower/designer Details (indicate cue type and reason): BSC over toilet, min guard progressing to supervision Toileting- Clothing Manipulation and Hygiene: Supervision/safety;Sit to/from stand;Sitting/lateral lean Toileting - Clothing  Manipulation Details (indicate cue type and reason): no assist needed, able to perform peri care and clothing mgmt without LOB     Functional mobility during ADLs: Min guard;Rolling walker General ADL Comments: Pt with known L LE weakness and foot drop, noted swelling and increased effort needed to move this LE. Pt limited most by cardiopulmonary deficits, desats on RA and noted SOB with minimal activity. Cued for pursed lip breathing, energy conservation to complete ADLs seated and current need for O2 to maintain sats during activity     Vision Baseline Vision/History: 0 No visual deficits Ability to See in Adequate Light: 0 Adequate Patient Visual Report: No change from baseline Vision Assessment?: No apparent visual deficits     Perception     Praxis      Pertinent Vitals/Pain Pain Assessment: No/denies pain Pain Intervention(s): Monitored during session     Hand Dominance Right   Extremity/Trunk Assessment Upper Extremity Assessment Upper Extremity Assessment: Generalized weakness   Lower Extremity Assessment Lower Extremity Assessment: Defer to PT evaluation   Cervical / Trunk Assessment Cervical / Trunk Assessment: Normal   Communication Communication Communication: No difficulties   Cognition Arousal/Alertness: Awake/alert Behavior During Therapy: WFL for tasks assessed/performed Overall Cognitive Status: Within Functional Limits for tasks assessed                                     General Comments  Daughter present and supportive. Received on RA. With activity on RA, desats to 85% to get to bathroom (close to bed) and 82% to get to sink across room, 3/4 DOE. Recovers with pursed lip breathing to 90-91. But recovers to 94% quickly when 2 L O2 placed on. 123 HR highest noted    Exercises     Shoulder Instructions      Home Living Family/patient expects to be discharged to:: Private residence Living Arrangements: Spouse/significant  other Available Help at Discharge: Family;Available 24 hours/day Type of Home: House Home Access: Stairs to enter [employer] of Steps: 1 Entrance Stairs-Rails: None Home Layout: One level     Bathroom Shower/Tub: Occupational psychologist: Standard Bathroom Accessibility: Yes How Accessible: Accessible via walker Home Equipment: [place] - 4 wheels;Grab bars - toilet;Shower seat - built Hotel manager: [employer]        Prior Functioning/Environment Level of Independence: Needs assistance  Gait / Transfers Assistance Needed: used rollator for mobility since illness. previously, no use of AD ADL's / Homemaking Assistance Needed: prior to initial illness onset, pt completely independent then began to require assist for LB ADLs. recent DC from CIR at MOD I level for ADL/basic IADLs            OT Problem List: Decreased strength;Decreased activity tolerance;Impaired balance (sitting and/or standing);Cardiopulmonary status limiting activity      OT Treatment/Interventions: Self-care/ADL training;DME and/or AE instruction;Therapeutic exercise;Therapeutic activities;Patient/family education;Balance training;Energy conservation    OT Goals(Current goals can be found in the care plan section) Acute Rehab OT Goals Patient Stated Goal: to get stronger and get home more confident with walking and self care, "to cook a meal"; be able to go in kitchen to make a cup of tea OT Goal Formulation: With patient Time For Goal Achievement: 07/19/21  Potential to Achieve Goals: Good  OT Frequency: Min 2X/week   Barriers to D/C:            Co-evaluation              AM-PAC OT "6 Clicks" Daily Activity     Outcome Measure Help from another person eating meals?: None Help from another person taking care of personal grooming?: A Little Help from another person toileting, which includes using toliet, bedpan, or urinal?: A Little Help from another  person bathing (including washing, rinsing, drying)?: A Little Help from another person to put on and taking off regular upper body clothing?: A Lot Help from another person to put on and taking off regular lower body clothing?: A Little 6 Click Score: 18   End of Session Equipment Utilized During Treatment: Gait belt;Rolling walker;Oxygen Nurse Communication: Mobility status;Other (comment) (O2)  Activity Tolerance: Patient tolerated treatment well Patient left: with family/visitor present;Other (comment) (with PT receiving pt seated at sink)  OT Visit Diagnosis: Other abnormalities of gait and mobility (R26.89);Muscle weakness (generalized) (M62.81);History of falling (Z91.81)                Time: 1225-1300 OT Time Calculation (min): 35 min Charges:  OT General Charges $OT Visit: 1 Visit OT Evaluation $OT Eval Moderate Complexity: 1 Mod OT Treatments $Self Care/Home Management : 8-22 mins  Malachy Chamber, OTR/L Acute Rehab Services Office: [phone]   Layla Maw 07/05/2021, 1:39 PM

## 2021-07-05 NOTE — Consult Note (Signed)
Palliative Care Consult Note                                  Date: 07/05/2021   Patient Name: Tara Murphy  DOB: 12/18/1945  MRN: [id number]  Age / Sex: 75 y.o., female  PCP: Leamon Arnt, MD Referring Physician: Darliss Cheney, MD  Reason for Consultation: Establishing goals of care  HPI/Patient Profile: Palliative Care consult requested for goals of care discussion in this 75 y.o. 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 She was admitted on 07/03/2021 with shortness of breath. Of note patient recently discharged CIR  06/29/2021. During work-up on current admission CT of chest showed PE and multifocal pneumonia. Patient initiated on Eliquis and 2L/[place]. IV antibiotics started.   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[place])    S/p total hip replacement left   Breast cancer ([place]) 2015   left    Cataract    Cholecystolithiasis    Chronic allergic rhinitis    Depression    Former smoker, stopped smoking in distant past 10/05/2019   Quit 2000; 83 y smoking history   GERD (gastroesophageal reflux disease)    Hepatic steatosis 06/08/2020   Intermittent elevated LFTs and ultrasound 2018   Hyperlipemia    IBS (irritable bowel syndrome) 10/08/2018   Nl colonoscopy and EGD 09/2018   IBS (irritable bowel syndrome)    Lichen plano-pilaris    Major depression, 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  Repeat 2020   Urinary tract infection    hx of frequent      Subjective:   This NP Tara Murphy reviewed medical records, received report from team, assessed the patient and then met at the patient's bedside with Tara Murphy and her daughter to discuss diagnosis, prognosis, GOC, EOL wishes disposition and options.  Patient is sitting up in bed. No acute distress noted. Denies pain or shortness of breath. On room air.    Concept of Palliative Care was introduced as specialized medical care for people and their families living with serious illness.  It focuses on providing relief from the symptoms and stress of a serious illness.  The goal is to improve quality of life for both the patient and the family. Values and goals of care important to patient and family were attempted to be elicited.  I created space and opportunity for patient and family to explore state of health prior to admission, thoughts, and feelings. Tara Murphy shares prior to recent illness she was independent of all ADLs however since recent admissions has experienced increase fatigue. She was actively driving and caring for herself 6-8 weeks ago. She does require some assistance due to this. She is ambulatory in the home with a rollator and uses a grab bar in the shower and bathroom.   We discussed Her current illness and what it means in the larger context of  Her on-going co-morbidities. Natural disease trajectory and expectations were discussed.  Both patient and her daughter verbalized understanding of current illness and co-morbidities. Tara Murphy becomes emotional expressing her inability to provide the care she once had for herself. She is tearful expressing she has always been independent and endorses she does not have much patience because of "go getter" personality. This is the first time since her recent cancer diagnosis that she has felt comfortable enough expressing her feelings and allowed herself to cry. Emotional  support provided.   Her daughter is appreciative of conversations and patient acknowledging how her health is changing her quality of life.   I discussed the importance of continued conversation with family and their medical providers regarding overall plan of care and treatment options, ensuring decisions are within the context of the patients values and GOCs.  Questions and concerns were addressed. The family was encouraged to call with questions or concerns.  PMT will continue to support holistically as needed.  Life Review: Patient is originally from [place]. She resides in the home with her husband of 24 years, Tara Murphy. She has 2 daughters who are actively involved in her care. 1 daughter lives in [place] and Tara Murphy resides locally. 3 grandsons and 1 granddaughter. 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 She enjoys crafting.    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)  Left foot drop   Scheduled Meds:  apixaban  10 mg Oral BID   Followed by   Derrill Memo ON 07/10/2021] apixaban  5 mg Oral BID   docusate sodium  100 mg Oral BID   gabapentin  600 mg Oral QHS   melatonin  3 mg Oral QHS   [START ON 07/06/2021] multivitamin with minerals  1 tablet Oral Daily   Ensure Max Protein  11 oz Oral Daily   rOPINIRole  4 mg Oral QHS   sodium chloride flush  3 mL Intravenous Q12H    Continuous Infusions:  cefTRIAXone (ROCEPHIN)  IV 2 g (07/04/21 1356)   doxycycline (VIBRAMYCIN) IV Stopped (07/05/21 0348)    PRN Meds: acetaminophen **OR** acetaminophen, albuterol, bisacodyl, busPIRone, hydrALAZINE, HYDROcodone-acetaminophen, morphine injection, omeprazole, ondansetron **OR** ondansetron (ZOFRAN) IV, polyethylene glycol  Allergies  Allergen Reactions   Fluorescein Itching and Other  (See Comments)   Contrast Media [Iodinated Diagnostic Agents] Hives   Hydromorphone Hcl Nausea Only   Other Rash    Certain Band-Aids don't agree with the patient's skin; hospital ID wristband causes rashes   Sulfamethoxazole-Trimethoprim Hives   Fentanyl Other (See Comments)    Redness and flushing over body    Tape Rash    Certain Band-Aids don't agree with the patient's skin; hospital ID wristband causes rashes   Terbinafine And Related Hives and Rash    Review of Systems  Constitutional:  Positive for fatigue.  Respiratory:  Positive for shortness of breath.   Neurological:  Positive for weakness.  Unless otherwise noted, a complete review of systems is negative.  Physical Exam General: NAD, awake and alert  Cardiovascular: regular rate and rhythm Pulmonary: diminished, normal breathing pattern, on room air Abdomen: soft, nontender, + bowel sounds Extremities: no edema, no joint deformities Skin: no rashes, warm and dry Neurological: AAO x3, mood appropriate   Vital Signs:  BP 114/60 (BP Location: Right Arm)  Pulse 81   Temp 98.2 F (36.8 C) (Oral)   Resp (!) 25   Ht 5' 6.75" (1.695 m)   Wt 92.5 kg   SpO2 97%   BMI 32.18 kg/m  Pain Scale: 0-10   Pain Score: 0-No pain  SpO2: SpO2: 97 % O2 Device:SpO2: 97 % O2 Flow Rate: .O2 Flow Rate (L/min): 2 L/min  IO: Intake/output summary:  Intake/Output Summary (Last 24 hours) at 07/05/2021 1412 Last data filed at 07/05/2021 0415 Gross per 24 hour  Intake 600 ml  Output --  Net 600 ml    LBM:   Baseline Weight: Weight: 92.5 kg Most recent weight: Weight: 92.5 kg      Palliative Assessment/Data: PPS 50-60%   Advanced Care Planning:   Primary Decision Maker: HCPOA  Code Status/Advance Care Planning: Full code  A discussion was had today regarding advanced directives. Concepts specific to code status, artifical feeding and hydration, continued IV antibiotics and rehospitalization was had.   I discussed  at length patient's full code status with consideration to her current illness and co-morbidities. Mrs. Rozeboom and her daughter confirms wishes to remain a full code with full aggressive medical interventions. They both expressed understanding of her condition however emphasizes they are not ready for her to die or to give up.   Aayliah has a documented advanced directive. Her daughter Tara Murphy is primary HCPOA and her husband Tara Murphy is secondary.   Mrs. Savino and her daughter are clear of expressed wishes to continue to treat the treatable aggressively, full code, as they are remaining hopeful for improvement/stability. She understands she may have a new baseline and unable to do some of the things she did in the past however, she wishes to allow herself every opportunity to thrive and adjust her lifestyle and needs accordingly. Her goal is to return home with home health and family support.   Palliative Care services outpatient were explained and offered. Patient and family verbalized understanding and awareness of palliative's goals and philosophy of care. Patient is aware if receiving active treatment at [facility] I am available for ongoing support. Recommendations for outpatient palliative also placed. Patient and daughter appreciative and she does requesting support.    Assessment & Plan:   SUMMARY OF RECOMMENDATIONS   Full Code-as confirmed by patient and daughter Continue with current plan of care, full scope Patient is clear in expressed wishes that she is remaining hopeful for improvement/stability. Wishes to continue to treat the treatable aggressively allowing her every opportunity to thrive.  Recommendations for outpatient palliative. She and daughter in agreement. I will follow and arrange ongoing support with myself at [facility] in addition to outpatient support from community based palliative.  PMT will continue to support and follow as needed. Please call team line with  urgent needs.  Symptom Management:  Per Attending  Palliative Prophylaxis:  Bowel Regimen, Frequent Pain Assessment, and respiratory support  Additional Recommendations (Limitations, Scope, Preferences): Full Scope Treatment  Psycho-social/Spiritual:  Desire for further Chaplaincy support: no Additional Recommendations:  ongoing goals of care discussions   Prognosis:  Unable to determine-Guarded   Discharge Planning:  To Be Determined     Patient and daughter expressed understanding and was in agreement with this plan.   Time Total: 75 min.   Visit consisted of counseling and education dealing with the complex and emotionally intense issues of symptom management and palliative care in the setting of serious and potentially life-threatening illness.Greater than 50%  of this  time was spent counseling and coordinating care related to the above assessment and plan.  Signed by:  Alda Lea, AGPCNP-BC Palliative Medicine Team  Phone: [phone] Pager: [phone] Amion: Bjorn Pippin   Thank you for allowing the Palliative Medicine Team to assist in the care of this patient. Please utilize secure chat with additional questions, if there is no response within 30 minutes please call the above phone number. Palliative Medicine Team providers are available by phone from 7am to 5pm daily and can be reached through the team cell phone.  Should this patient require assistance outside of these hours, please call the patient's attending physician.

## 2021-07-05 NOTE — Progress Notes (Signed)
PROGRESS NOTE    Tara Murphy  NWG:[id number] DOB: Jul 26, 1946 DOA: 07/03/2021 PCP: Leamon Arnt, MD   Brief Narrative:  HPI: Tara Murphy is a 75 y.o. female with medical history significant of remote breast cancer and recent lung cancer with mets to brain s/p lobectomy (06/13/21, poorly differentiated NSCLC) and craniotomy for tumor resection (04/30/21, poorly differentiated metastatic lung adenocarcinoma) presenting with SOB.   Her cancer was diagnosed on admission from 8/2-11 when she presented with seizures and was found to have brain meds.  She was discharged on Friday from CIR and had started coughing prior to d/c.  Clear lungs, no respiratory distress, she was given Robitussin.  The last PT sessions required more effort than prior.  She went home and realized that she was not as strong as she suspected, decreased stamina.  It was harder to do everything.  She slid off the bed over the weekend, it was too difficult to stand and had to have EMS come lift her up.  The next day, she felt even more weak.  Increased cough.  Yesterday AM, she took a shower and it was too difficult to function afterwards and so she went to the ER.  She was feeling so rotten that she called her PCP and was seen.  She was made arrangements for a CXR but didn't make it and decided to come to the ER instead.  No prior h/o clots.  No fever.  She was too tired to stay awake at the dinner table, absolute fatigue.  Cough is nonproductive.     ED Course: [facility], per Dr. Tonie Griffith:   75 yo female with hx of breast cancer with mets to lung who had Left upper lobe lobectomy 3 weeks ago. Presented with SOB. Found to have PE on CT chest and multifocal pneumonia. No right heart strain. Placed on Eliquis for anticoagulation. Is on 2 L/min of O2 by n/c. Will need echo in am. Started on Rocephin and doxycycline for multifocal pneumonia    Assessment & Plan:   Principal Problem:   Pulmonary embolism  (HCC) Active Problems:   Familial hyperlipidemia   Primary cancer of left upper lobe of lung ([place])   Metastatic adenocarcinoma to brain ([place])   Left foot drop  Acute hypoxic respiratory failure secondary to acute PE: She feels slightly better.  Now off of oxygen.  Echo completed, results pending.  We will continue Eliquis.  Check Doppler lower extremity to rule out DVT.  Multifocal healthcare associated pneumonia: Continue Rocephin and doxycycline.  Check procalcitonin.  Check Legionella and Streptococcus urine antigen.  Metastatic lung cancer: S/p lobectomy during last hospitalization.  Her oncologist were informed of this admission.  They recommended outpatient follow-up.  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  Left foot drop/left lower extremity weakness: According to patient, this is secondary to brain mets and started during last hospitalization.  No change.  PT OT to see.  Mood disorder: Continue melatonin.  Continue BuSpar.  DVT prophylaxis:    Code Status: Full Code  Family Communication: Daughter present at bedside.  Plan of care discussed with both of them.   Status is: Inpatient  Remains inpatient appropriate because:Inpatient level of care appropriate due to severity of illness  Dispo: The patient is from: Home              Anticipated d/c is to: Home              Patient currently  is not medically stable to d/c.   Difficult to place patient No  Estimated body mass index is 32.18 kg/m as calculated from the following:   Height as of this encounter: 5' 6.75" (1.695 m).   Weight as of this encounter: 92.5 kg.     Nutritional Assessment: Body mass index is 32.18 kg/m.Marland Kitchen Seen by dietician.  I agree with the assessment and plan as outlined below: Nutrition Status:   Skin Assessment: I have examined the patient's skin and I agree with the wound assessment as performed by the wound care RN as outlined below:    Consultants:   None  Procedures:  None  Antimicrobials:  Anti-infectives (From admission, onward)    Start     Dose/Rate Route Frequency Ordered Stop   07/04/21 1330  doxycycline (VIBRAMYCIN) 100 mg in sodium chloride 0.9 % 250 mL IVPB        100 mg 125 mL/hr over 120 Minutes Intravenous Every 12 hours 07/04/21 1239     07/04/21 1248  cefTRIAXone (ROCEPHIN) 2 g in sodium chloride 0.9 % 100 mL IVPB        2 g 200 mL/hr over 30 Minutes Intravenous Every 24 hours 07/04/21 1239     07/03/21 2215  cefTRIAXone (ROCEPHIN) 1 g in sodium chloride 0.9 % 100 mL IVPB        1 g 200 mL/hr over 30 Minutes Intravenous  Once 07/03/21 2204 07/03/21 2328   07/03/21 2215  doxycycline (VIBRAMYCIN) 100 mg in sodium chloride 0.9 % 250 mL IVPB        100 mg 125 mL/hr over 120 Minutes Intravenous  Once 07/03/21 2204 07/04/21 0159          Subjective: Patient seen and examined.  Daughter is at bedside.  Patient states that she feels slightly better than yesterday.  No new complaint.  Objective: Vitals:   07/04/21 1927 07/04/21 2340 07/05/21 0349 07/05/21 0710  BP: (!) 129/58 (!) 119/59 114/60   Pulse: (!) 104 83 81   Resp: 20 20 20  (!) 25  Temp: 98.4 F (36.9 C) 98.2 F (36.8 C) 98.6 F (37 C) 98.2 F (36.8 C)  TempSrc: Oral Oral Oral Oral  SpO2: 99% 100% 97%   Weight:      Height:        Intake/Output Summary (Last 24 hours) at 07/05/2021 1109 Last data filed at 07/05/2021 0415 Gross per 24 hour  Intake 600 ml  Output --  Net 600 ml   Filed Weights   07/03/21 2200  Weight: 92.5 kg    Examination:  General exam: Appears calm and comfortable  Respiratory system: Clear to auscultation. Respiratory effort normal. Cardiovascular system: S1 & S2 heard, RRR. No JVD, murmurs, rubs, gallops or clicks. No pedal edema. Gastrointestinal system: Abdomen is nondistended, soft and nontender. No organomegaly or masses felt. Normal bowel sounds heard. Central nervous system: Alert and oriented. No focal  neurological deficits. Extremities: Symmetric 5 x 5 power. Skin: No rashes, lesions or ulcers Psychiatry: Judgement and insight appear normal. Mood & affect appropriate.    Data Reviewed: I have personally reviewed following labs and imaging studies  CBC: Recent Labs  Lab 07/02/21 1649 07/03/21 1437 07/05/21 0216  WBC 8.7 10.1 6.7  NEUTROABS 6.7 8.2*  --   HGB 10.7* 10.5* 8.9*  HCT 31.9* 31.5* 27.9*  MCV 90.6 90.3 93.9  PLT 293.0 296 324   Basic Metabolic Panel: Recent Labs  Lab 07/02/21 1649 07/03/21 1437 07/05/21 0216  NA 136 136 138  K 4.1 3.8 3.8  CL 100 101 107  CO2 25 24 23   GLUCOSE 102* 91 111*  BUN 9 13 16   CREATININE 0.38* 0.38* 0.64  CALCIUM 8.7 8.9 8.0*   GFR: Estimated Creatinine Clearance: 71.7 mL/min (by C-G formula based on SCr of 0.64 mg/dL). Liver Function Tests: Recent Labs  Lab 07/02/21 1649  AST 25  ALT 43*  ALKPHOS 94  BILITOT 0.7  PROT 6.0  ALBUMIN 3.4*   No results for input(s): LIPASE, AMYLASE in the last 168 hours. No results for input(s): AMMONIA in the last 168 hours. Coagulation Profile: No results for input(s): INR, PROTIME in the last 168 hours. Cardiac Enzymes: No results for input(s): CKTOTAL, CKMB, CKMBINDEX, TROPONINI in the last 168 hours. BNP (last 3 results) No results for input(s): PROBNP in the last 8760 hours. HbA1C: No results for input(s): HGBA1C in the last 72 hours. CBG: Recent Labs  Lab 07/04/21 1202 07/04/21 1652  GLUCAP 134* 195*   Lipid Profile: No results for input(s): CHOL, HDL, LDLCALC, TRIG, CHOLHDL, LDLDIRECT in the last 72 hours. Thyroid Function Tests: No results for input(s): TSH, T4TOTAL, FREET4, T3FREE, THYROIDAB in the last 72 hours. Anemia Panel: No results for input(s): VITAMINB12, FOLATE, FERRITIN, TIBC, IRON, RETICCTPCT in the last 72 hours. Sepsis Labs: Recent Labs  Lab 07/05/21 0838  PROCALCITON 47.80    Recent Results (from the past 240 hour(s))  Novel Coronavirus, NAA  ([facility])     Status: None   Collection Time: 07/02/21  4:50 PM   Specimen: Nasopharyngeal(NP) swabs in vial transport medium   Nasopharynge  Testing  Result Value Ref Range Status   SARS-CoV-2, NAA Not Detected Not Detected Final    Comment: This nucleic acid amplification test was developed and its performance characteristics determined by [employer]. Nucleic acid amplification tests include RT-PCR and TMA. This test has not been FDA cleared or approved. This test has been authorized by FDA under an Emergency Use Authorization (EUA). This test is only authorized for the duration of time the declaration that circumstances exist justifying the authorization of the emergency use of in vitro diagnostic tests for detection of SARS-CoV-2 virus and/or diagnosis of COVID-19 infection under section 564(b)(1) of the Act, 21 U.S.C. 740CXK-4(Y) (1), unless the authorization is terminated or revoked sooner. When diagnostic testing is negative, the possibility of a false negative result should be considered in the context of a patient's recent exposures and the presence of clinical signs and symptoms consistent with COVID-19. An individual without symptoms of COVID-19 and who is not shedding SARS-CoV-2 virus wo uld expect to have a negative (not detected) result in this assay.   SARS-COV-2, NAA 2 DAY TAT     Status: None   Collection Time: 07/02/21  4:50 PM   Nasopharynge  Testing  Result Value Ref Range Status   SARS-CoV-2, NAA 2 DAY TAT Performed  Final  Resp Panel by RT-PCR (Flu A&B, Covid) Nasopharyngeal Swab     Status: None   Collection Time: 07/03/21  7:58 PM   Specimen: Nasopharyngeal Swab; Nasopharyngeal(NP) swabs in vial transport medium  Result Value Ref Range Status   SARS Coronavirus 2 by RT PCR NEGATIVE NEGATIVE Final    Comment: (NOTE) SARS-CoV-2 target nucleic acids are NOT DETECTED.  The SARS-CoV-2 RNA is generally detectable in upper respiratory specimens during  the acute phase of infection. The lowest concentration of SARS-CoV-2 viral copies this assay can detect is 138 copies/mL. A negative result  does not preclude SARS-Cov-2 infection and should not be used as the sole basis for treatment or other patient management decisions. A negative result may occur with  improper specimen collection/handling, submission of specimen other than nasopharyngeal swab, presence of viral mutation(s) within the areas targeted by this assay, and inadequate number of viral copies(<138 copies/mL). A negative result must be combined with clinical observations, patient history, and epidemiological information. The expected result is Negative.  Fact Sheet for Patients:  EntrepreneurPulse.com.au  Fact Sheet for Healthcare Providers:  IncredibleEmployment.be  This test is no t yet approved or cleared by the [place] FDA and  has been authorized for detection and/or diagnosis of SARS-CoV-2 by FDA under an Emergency Use Authorization (EUA). This EUA will remain  in effect (meaning this test can be used) for the duration of the COVID-19 declaration under Section 564(b)(1) of the Act, 21 U.S.C.section 360bbb-3(b)(1), unless the authorization is terminated  or revoked sooner.       Influenza A by PCR NEGATIVE NEGATIVE Final   Influenza B by PCR NEGATIVE NEGATIVE Final    Comment: (NOTE) The Xpert Xpress SARS-CoV-2/FLU/RSV plus assay is intended as an aid in the diagnosis of influenza from Nasopharyngeal swab specimens and should not be used as a sole basis for treatment. Nasal washings and aspirates are unacceptable for Xpert Xpress SARS-CoV-2/FLU/RSV testing.  Fact Sheet for Patients: EntrepreneurPulse.com.au  Fact Sheet for Healthcare Providers: IncredibleEmployment.be  This test is not yet approved or cleared by the [place] FDA and has been authorized for detection and/or  diagnosis of SARS-CoV-2 by FDA under an Emergency Use Authorization (EUA). This EUA will remain in effect (meaning this test can be used) for the duration of the COVID-19 declaration under Section 564(b)(1) of the Act, 21 U.S.C. section 360bbb-3(b)(1), unless the authorization is terminated or revoked.  Performed at [facility], [address] [place], [place] [address]       Radiology Studies: DG Chest 2 View  Result Date: 07/03/2021 CLINICAL DATA:  Shortness of breath and fatigue. EXAM: CHEST - 2 VIEW COMPARISON:  Chest x-ray 06/17/2021, CT chest 04/25/2021 FINDINGS: The heart and mediastinal contours are unchanged. Status post left upper lobectomy. No pulmonary edema. Possible increase in trace to small volume left pleural effusion. No pneumothorax. No acute osseous abnormality. IMPRESSION: Possible increase in trace to small volume left pleural effusion in a patient status post left upper lobectomy. Electronically Signed   By: Iven Finn M.D.   On: 07/03/2021 15:16   CT Angio Chest PE W/Cm &/Or Wo Cm  Result Date: 07/03/2021 CLINICAL DATA:  PE suspected, high prob EXAM: CT ANGIOGRAPHY CHEST WITH CONTRAST TECHNIQUE: Multidetector CT imaging of the chest was performed using the standard protocol during bolus administration of intravenous contrast. Multiplanar CT image reconstructions and MIPs were obtained to evaluate the vascular anatomy. CONTRAST:  58mL OMNIPAQUE IOHEXOL 350 MG/ML SOLN COMPARISON:  Same day radiograph, PET-CT 06/01/2021, chest CT 04/25/2021 FINDINGS: Cardiovascular: Mildly enlarged heart. Coronary artery calcifications. No pericardial disease. Normal size main pulmonary artery. The branch pulmonary arteries are mildly enlarged with tortuosity, which can be seen in chronic pulmonary hypertension. There are pulmonary emboli in the right lower lobar segmental arteries extending into the subsegmental branches. RV: LV ratio is 0.9. No evidence of right  heart strain. Mediastinum/Nodes: Prominent hilar and mediastinal lymph nodes, favored to be reactive. The thyroid is unremarkable. Esophagus is unremarkable. Lungs/Pleura: Postsurgical changes of left upper lobectomy. The remaining airways are patent. There is mild bronchial  wall thickening. There are patchy ground-glass opacities bilaterally, worst in the mid to upper lungs, new since prior chest CT in August. Associated mild interlobular septal thickening. Unchanged 8 mm right upper lobe pulmonary nodule (series 6, image 45). Moderate size left pleural effusion with adjacent basilar atelectasis. Upper Abdomen: No acute findings. Musculoskeletal: No acute osseous abnormality. No suspicious lytic or blastic lesions. Review of the MIP images confirms the above findings. IMPRESSION: Right lower lobe segmental pulmonary emboli, extending into the subsegmental branches. No evidence of right heart strain. Multifocal ground-glass opacities bilaterally, worst in the mid to upper lungs, new since prior PET-CT in September, consistent with multifocal pneumonia. Appearance can be also seen with atypical viral infection including COVID-19. Recommend follow-up to resolution. Postsurgical changes of left upper lobectomy, including a moderate size left pleural effusion. Unchanged 8 mm right upper lobe pulmonary nodule. Recommend continued oncologic follow-up with follow-up chest CT as clinically indicated. These results were called by telephone at the time of interpretation on 07/03/2021 at 9:37 pm to ED provider, who verbally acknowledged these results. Electronically Signed   By: Maurine Simmering M.D.   On: 07/03/2021 21:43    Scheduled Meds:  apixaban  10 mg Oral BID   Followed by   Derrill Memo ON 07/10/2021] apixaban  5 mg Oral BID   docusate sodium  100 mg Oral BID   gabapentin  600 mg Oral QHS   melatonin  3 mg Oral QHS   rOPINIRole  4 mg Oral QHS   sodium chloride flush  3 mL Intravenous Q12H   Continuous Infusions:   cefTRIAXone (ROCEPHIN)  IV 2 g (07/04/21 1356)   doxycycline (VIBRAMYCIN) IV Stopped (07/05/21 0348)     LOS: 1 day   Time spent: 33 minutes   Darliss Cheney, MD [facility]  07/05/2021, 11:09 AM  Please page via [facility] and do not message via secure chat for anything urgent. Secure chat can be used for anything non urgent.  How to contact the [facility] Attending or Consulting provider [place] or covering provider during after hours [place], for this patient?  Check the care team in [facility] and look for a) attending/consulting TR[facility] provider listed and b) the [facility] team listed. Page or secure chat [phone]. Log into [email or web address] and use [place] universal password to access. If you do not have the password, please contact the hospital operator. Locate the [facility] provider you are looking for under [facility] and page to a number that you can be directly reached. If you still have difficulty reaching the provider, please page the [facility] (Director on Call) for the Hospitalists listed on amion for assistance.

## 2021-07-06 ENCOUNTER — Inpatient Hospital Stay
Admit: 2021-07-06 | Discharge: 2021-07-06 | Disposition: A | Discharge status: Home / Self Care | Payer: Medicare Other | Attending: Urology | Admitting: Urology

## 2021-07-06 ENCOUNTER — Telehealth: Payer: Self-pay

## 2021-07-06 ENCOUNTER — Other Ambulatory Visit (HOSPITAL_COMMUNITY): Payer: Self-pay

## 2021-07-06 DIAGNOSIS — I2690 Septic pulmonary embolism without acute cor pulmonale: Secondary | ICD-10-CM | POA: Diagnosis not present

## 2021-07-06 LAB — BASIC METABOLIC PANEL
Anion gap: 8 (ref 5–15)
BUN: 9 mg/dL (ref 8–23)
CO2: 22 mmol/L (ref 22–32)
Calcium: 8.1 mg/dL — ABNORMAL LOW (ref 8.9–10.3)
Chloride: 108 mmol/L (ref 98–111)
Creatinine, Ser: 0.4 mg/dL — ABNORMAL LOW (ref 0.44–1.00)
GFR, Estimated: >60 mL/min (ref >60)
Glucose, Bld: 111 mg/dL — ABNORMAL HIGH (ref 70–99)
Potassium: 3.9 mmol/L (ref 3.5–5.1)
Sodium: 138 mmol/L (ref 135–145)

## 2021-07-06 LAB — STREP PNEUMONIAE URINARY ANTIGEN: Strep Pneumo Urinary Antigen: NEGATIVE

## 2021-07-06 MED ORDER — APIXABAN (ELIQUIS) VTE STARTER PACK (10MG AND 5MG)
ORAL_TABLET | ORAL | 0 refills | Status: DC
  Filled 2021-07-06: qty 74, 30d supply, fill #0

## 2021-07-06 MED ORDER — CEFDINIR 300 MG PO CAPS
300.0000 mg | ORAL_CAPSULE | Freq: Two times a day (BID) | ORAL | 0 refills | Status: AC
  Filled 2021-07-06: qty 8, 4d supply, fill #0

## 2021-07-06 NOTE — Progress Notes (Signed)
SATURATION QUALIFICATIONS: (This note is used to comply with regulatory documentation for home oxygen)  Patient Saturations on Room Air at Rest = 90%  Patient Saturations on Room Air while Ambulating = 86%  Patient Saturations on 2 Liters of oxygen while Ambulating = 91%  Please briefly explain why patient needs home oxygen: Pt desat to 86% soon after starting to ambulate. Pt required 2L of O2 to to keep O2 sat above 88% while ambulating.

## 2021-07-06 NOTE — TOC Transition Note (Addendum)
Transition of Care [facility]) - CM/SW Discharge Note   Patient Details  Name: Tara Murphy MRN: [id number] Date of Birth: 1946-02-07  Transition of Care [facility]) CM/SW Contact:  Zenon Mayo, RN Phone Number: 07/06/2021, 9:16 AM   Clinical Narrative:    NCM spoke with patient, she is for dc today, asked what company did she want her oxygen with ,she states she does not have a preference.  NCM made referral to [facility] with [employer], he will deliver the oxygen to her room prior to dc.  She is also on Eliquis, informed her that [facility] will deliver the medications to her room and her copay for a refill will be 42.00 , she states she can afford this.  She will have transport at discharge. NCM offered choice for outpatient palliative services, she chose [facility], NCM made referral to [facility] with [facility].  Patient would like to do outpatient PT/OT at [facility], NCM made referral in epic to [place].   Final next level of care: Home/Self Care Barriers to Discharge: No Barriers Identified   Patient Goals and CMS Choice Patient states their goals for this hospitalization and ongoing recovery are:: return home   Choice offered to / list presented to : NA  Discharge Placement                       Discharge Plan and Services                DME Arranged: Oxygen DME Agency: [employer] Date DME Agency Contacted: 07/06/21 Time DME Agency Contacted: [phone] Representative spoke with at DME Agency: Brenton [facility] HH Arranged: NA          Social Determinants of Health ([place]) Interventions     Readmission Risk Interventions No flowsheet data found.

## 2021-07-06 NOTE — Telephone Encounter (Signed)
Please advise  Seen for respiratory distress

## 2021-07-06 NOTE — Progress Notes (Signed)
[facility] ([facility])  Hospital Liaison: RN note         This patient has been referred to our palliative care services in the community.  [facility] will continue to follow for any discharge planning needs and to coordinate continuation of palliative care in the outpatient setting.    If you have questions or need assistance, please call [phone] or contact the hospital Liaison listed on AMION.      Thank you for this referral.         Mary Anne Robertson, RN, CCM  [facility] Hospital Liaison   [phone] 

## 2021-07-06 NOTE — Progress Notes (Signed)
Nursing Dc note  Patient alert and oriented. Both poa shannon and patient verbalized understanding of dc instructions. Toc meds and belongings given to patient and family. Patient denies any pain or sob. Patient going home via car with husband.

## 2021-07-06 NOTE — Discharge Summary (Signed)
Physician Discharge Summary  Tara Murphy OZH:[id number] DOB: 1945-10-02 DOA: 07/03/2021  PCP: Leamon Arnt, MD  Admit date: 07/03/2021 Discharge date: 07/06/2021 30 Day Unplanned Readmission Risk Score    Flowsheet Row ED to Hosp-Admission (Current) from 07/03/2021 in [facility] [facility] PROGRESSIVE CARE  30 Day Unplanned Readmission Risk Score (%) 26.2 Filed at 07/06/2021 0801       This score is the patient's risk of an unplanned readmission within 30 days of being discharged (0 -100%). The score is based on dignosis, age, lab data, medications, orders, and past utilization.   Low:  0-14.9   Medium: 15-21.9   High: 22-29.9   Extreme: 30 and above          Admitted From: Home Disposition:   Home  Recommendations for Outpatient Follow-up:  Follow up with PCP in 1-2 weeks Please obtain BMP/CBC in one week Please follow up with your PCP on the following pending results: Unresulted Labs (From admission, onward)     Start     Ordered   07/05/21 1114  Legionella Pneumophila Serogp 1 Ur Ag  Once,   R        07/05/21 [place]: None Equipment/Devices: Home oxygen  Discharge Condition: Stable CODE STATUS: Full code Diet recommendation: Cardiac  Subjective: Patient seen and examined.  She says she feels much better and is ready to go home.  2 daughters at the bedside.  Following HPI and ED course is copied from my colleague admitting hospitalist Dr. Lorin Mercy' H&P. HPI: Tara Murphy is a 75 y.o. female with medical history significant of remote breast cancer and recent lung cancer with mets to brain s/p lobectomy (06/13/21, poorly differentiated NSCLC) and craniotomy for tumor resection (04/30/21, poorly differentiated metastatic lung adenocarcinoma) presenting with SOB.   Her cancer was diagnosed on admission from 8/2-11 when she presented with seizures and was found to have brain meds.  She was discharged on Friday from CIR and had started coughing prior  to d/c.  Clear lungs, no respiratory distress, she was given Robitussin.  The last PT sessions required more effort than prior.  She went home and realized that she was not as strong as she suspected, decreased stamina.  It was harder to do everything.  She slid off the bed over the weekend, it was too difficult to stand and had to have EMS come lift her up.  The next day, she felt even more weak.  Increased cough.  Yesterday AM, she took a shower and it was too difficult to function afterwards and so she went to the ER.  She was feeling so rotten that she called her PCP and was seen.  She was made arrangements for a CXR but didn't make it and decided to come to the ER instead.  No prior h/o clots.  No fever.  She was too tired to stay awake at the dinner table, absolute fatigue.  Cough is nonproductive.     ED Course: [facility] transfer, per Dr. Tonie Griffith:   75 yo female with hx of breast cancer with mets to lung who had Left upper lobe lobectomy 3 weeks ago. Presented with SOB. Found to have PE on CT chest and multifocal pneumonia. No right heart strain. Placed on Eliquis for anticoagulation. Is on 2 L/min of O2 by n/c. Will need echo in am. Started on Rocephin and doxycycline for multifocal pneumonia  Brief/Interim  Summary: Patient was admitted with acute hypoxic respiratory failure secondary to bilateral multifocal pneumonia as well as acute PE.  She was later diagnosed with left lower extremity DVT as well.  She was started on Eliquis from admission.  Patient has improved since admission.  She was assessed by PT OT who recommended outpatient PT.  Patient already had a scheduled appointment with outpatient PT due to her previous hospitalizations.  Patient is stable and wants to go home so she is going be discharged home today on Eliquis and 4 more days of cefdinir as well as 2 L of oxygen.  Discharge Diagnoses:  Principal Problem:   Pulmonary embolism (HCC) Active Problems:   Familial  hyperlipidemia   Primary cancer of left upper lobe of lung (HCC)   Metastatic adenocarcinoma to brain [facility])   Left foot drop    Discharge Instructions   Allergies as of 07/06/2021       Reactions   Fluorescein Itching, Other (See Comments)   Contrast Media [iodinated Diagnostic Agents] Hives   Hydromorphone Hcl Nausea Only   Other Rash   Certain Band-Aids don't agree with the patient's skin; hospital ID wristband causes rashes   Sulfamethoxazole-trimethoprim Hives   Fentanyl Other (See Comments)   Redness and flushing over body    Tape Rash   Certain Band-Aids don't agree with the patient's skin; hospital ID wristband causes rashes   Terbinafine And Related Hives, Rash        Medication List     TAKE these medications    acetaminophen 325 MG tablet Commonly known as: TYLENOL Take 1-2 tablets (325-650 mg total) by mouth every 4 (four) hours as needed for mild pain.   Apixaban Starter Pack (10mg  and 5mg ) Commonly known as: ELIQUIS STARTER PACK Take as directed on package: start with two-5mg  tablets twice daily for 7 days. On day 8, switch to one-5mg  tablet twice daily.   busPIRone 7.5 MG tablet Commonly known as: BUSPAR Take 1 tablet (7.5 mg total) by mouth 2 (two) times daily as needed (anxiety).   cefdinir 300 MG capsule Commonly known as: OMNICEF Take 1 capsule (300 mg total) by mouth 2 (two) times daily for 4 days.   clobetasol 0.05 % external solution Commonly known as: TEMOVATE Apply 1 application topically daily as needed (irritation).   fluticasone 50 MCG/ACT nasal spray Commonly known as: FLONASE Place 1 spray into both nostrils daily as needed for allergies or rhinitis.   gabapentin 300 MG capsule Commonly known as: NEURONTIN Take 2 capsules (600 mg total) by mouth at bedtime.   melatonin 3 MG Tabs tablet Take 1 tablet (3 mg total) by mouth at bedtime.   Omeprazole 20 MG Tbdd Take 20 mg by mouth daily as needed (indigestion). OTC   rOPINIRole  4 MG tablet Commonly known as: REQUIP Take 1 tablet (4 mg total) by mouth at bedtime.        Follow-up Information     Leamon Arnt, MD Follow up in 1 week(s).   Specialty: Family Medicine Contact information: [name] [place] [address] [phone]                Allergies  Allergen Reactions   Fluorescein Itching and Other (See Comments)   Contrast Media [Iodinated Diagnostic Agents] Hives   Hydromorphone Hcl Nausea Only   Other Rash    Certain Band-Aids don't agree with the patient's skin; hospital ID wristband causes rashes   Sulfamethoxazole-Trimethoprim Hives   Fentanyl Other (  See Comments)    Redness and flushing over body    Tape Rash    Certain Band-Aids don't agree with the patient's skin; hospital ID wristband causes rashes   Terbinafine And Related Hives and Rash    Consultations: None   Procedures/Studies: DG Chest 2 View  Result Date: 07/03/2021 CLINICAL DATA:  Shortness of breath and fatigue. EXAM: CHEST - 2 VIEW COMPARISON:  Chest x-ray 06/17/2021, CT chest 04/25/2021 FINDINGS: The heart and mediastinal contours are unchanged. Status post left upper lobectomy. No pulmonary edema. Possible increase in trace to small volume left pleural effusion. No pneumothorax. No acute osseous abnormality. IMPRESSION: Possible increase in trace to small volume left pleural effusion in a patient status post left upper lobectomy. Electronically Signed   By: Iven Finn M.D.   On: 07/03/2021 15:16   DG Chest 2 View  Result Date: 06/17/2021 CLINICAL DATA:  Follow-up pneumothorax. EXAM: CHEST - 2 VIEW COMPARISON:  06/16/2021 FINDINGS: Stable cardiac enlargement. Postop change from partial left upper lobectomy noted. 7 mm left lateral pneumothorax is identified. This appears similar in volume when compared with the previous exam. No focal pulmonary opacities. IMPRESSION: 1. No change in the appearance of left lateral pneumothorax. 2. Stable cardiac  enlargement. Electronically Signed   By: Kerby Moors M.D.   On: 06/17/2021 08:44   DG Chest 2 View  Result Date: 06/16/2021 CLINICAL DATA:  Status post partial lobectomy. EXAM: CHEST - 2 VIEW COMPARISON:  06/15/2021 FINDINGS: Stable cardiac enlargement. Postsurgical changes are noted within the left upper lobe. Thin lateral pneumothorax is noted overlying the left upper lung measuring approximately 4 mm in thickness. This is unchanged in appearance when compared with previous exam from 06/15/21. Right lung is clear. IMPRESSION: Stable small left-sided pneumothorax. Electronically Signed   By: Kerby Moors M.D.   On: 06/16/2021 09:01   DG Chest 2 View  Result Date: 06/11/2021 CLINICAL DATA:  75 year old female with preoperative chest x-ray EXAM: CHEST - 2 VIEW COMPARISON:  12/27/2019, PET-CT 06/01/2020 FINDINGS: Cardiomediastinal silhouette unchanged in size and contour. No evidence of central vascular congestion. No interlobular septal thickening. Nodular density in the left suprahilar region overlying the aortic arch on the lateral view corresponds to the recent PET-CT. Coarsened interstitial markings, with no confluent airspace disease. No pneumothorax or pleural effusion. No acute displaced fracture. Degenerative changes of the spine. IMPRESSION: Negative for acute cardiopulmonary disease. Nodular density in the left suprahilar region corresponds to findings on recent PET-CT Electronically Signed   By: Corrie Mckusick D.O.   On: 06/11/2021 16:33   CT Angio Chest PE W/Cm &/Or Wo Cm  Result Date: 07/03/2021 CLINICAL DATA:  PE suspected, high prob EXAM: CT ANGIOGRAPHY CHEST WITH CONTRAST TECHNIQUE: Multidetector CT imaging of the chest was performed using the standard protocol during bolus administration of intravenous contrast. Multiplanar CT image reconstructions and MIPs were obtained to evaluate the vascular anatomy. CONTRAST:  4mL OMNIPAQUE IOHEXOL 350 MG/ML SOLN COMPARISON:  Same day  radiograph, PET-CT 06/01/2021, chest CT 04/25/2021 FINDINGS: Cardiovascular: Mildly enlarged heart. Coronary artery calcifications. No pericardial disease. Normal size main pulmonary artery. The branch pulmonary arteries are mildly enlarged with tortuosity, which can be seen in chronic pulmonary hypertension. There are pulmonary emboli in the right lower lobar segmental arteries extending into the subsegmental branches. RV: LV ratio is 0.9. No evidence of right heart strain. Mediastinum/Nodes: Prominent hilar and mediastinal lymph nodes, favored to be reactive. The thyroid is unremarkable. Esophagus is unremarkable. Lungs/Pleura: Postsurgical changes of left  upper lobectomy. The remaining airways are patent. There is mild bronchial wall thickening. There are patchy ground-glass opacities bilaterally, worst in the mid to upper lungs, new since prior chest CT in August. Associated mild interlobular septal thickening. Unchanged 8 mm right upper lobe pulmonary nodule (series 6, image 45). Moderate size left pleural effusion with adjacent basilar atelectasis. Upper Abdomen: No acute findings. Musculoskeletal: No acute osseous abnormality. No suspicious lytic or blastic lesions. Review of the MIP images confirms the above findings. IMPRESSION: Right lower lobe segmental pulmonary emboli, extending into the subsegmental branches. No evidence of right heart strain. Multifocal ground-glass opacities bilaterally, worst in the mid to upper lungs, new since prior PET-CT in September, consistent with multifocal pneumonia. Appearance can be also seen with atypical viral infection including COVID-19. Recommend follow-up to resolution. Postsurgical changes of left upper lobectomy, including a moderate size left pleural effusion. Unchanged 8 mm right upper lobe pulmonary nodule. Recommend continued oncologic follow-up with follow-up chest CT as clinically indicated. These results were called by telephone at the time of  interpretation on 07/03/2021 at 9:37 pm to ED provider, who verbally acknowledged these results. Electronically Signed   By: Maurine Simmering M.D.   On: 07/03/2021 21:43   DG Chest 1V REPEAT Same Day  Result Date: 06/15/2021 CLINICAL DATA:  S/P lobectomy of lung Z90.2 (ICD-10-CM) Encounter for chest tube removal Z46.82 (ICD-10-CM) EXAM: CHEST - 1 VIEW SAME DAY COMPARISON:  Same day chest radiograph. FINDINGS: Postsurgical changes in the left lung with volume loss, similar. No new consolidation. Subtle vertical linear opacity along the lateral left lung. No visible pleural effusions. Similar cardiomegaly. Similar subcutaneous emphysema along the lower left chest wall. IMPRESSION: 1. Subtle vertical linear opacity along the lateral left lung, which could represent a small pneumothorax or skin fold. Recommend repeat chest radiograph to evaluate for persistence. 2. Otherwise, similar postsurgical changes without acute cardiopulmonary disease Electronically Signed   By: Margaretha Sheffield M.D.   On: 06/15/2021 15:16   DG CHEST PORT 1 VIEW  Result Date: 06/15/2021 CLINICAL DATA:  Status post left upper lobectomy. EXAM: PORTABLE CHEST 1 VIEW COMPARISON:  Chest x-ray from yesterday. FINDINGS: Left-sided chest tube remains in place. There is a trace left apical pneumothorax. Interval removal of the right internal jugular central venous catheter. Stable cardiomegaly. Normal pulmonary vascularity. Low lung volumes with mild bibasilar atelectasis. No pleural effusion. No acute osseous abnormality. Decreasing subcutaneous emphysema in the left lower neck and left lateral chest wall. IMPRESSION: 1. Trace left apical pneumothorax. 2. Low lung volumes with mild bibasilar atelectasis. Electronically Signed   By: Titus Dubin M.D.   On: 06/15/2021 09:26   DG Chest Port 1 View  Result Date: 06/14/2021 CLINICAL DATA:  Status post left lung partial resection, chest pain EXAM: PORTABLE CHEST 1 VIEW COMPARISON:  06/13/2021  FINDINGS: Lung volumes are small. There is hyper lucency of the left lung in keeping with partial left lung resection. Large bore left chest tube is in place. No pneumothorax or pleural effusion. Subcutaneous gas seen at the left neck base and within the left chest wall. Right internal jugular temporary dialysis catheter tip noted within the superior cavoatrial junction. Mild cardiomegaly is stable. Fullness within the left hilum is likely postsurgical in nature. Pulmonary vascularity is normal. IMPRESSION: Pulmonary hypoinflation. Hyper lucency of the left lung in keeping with partial left lung resection. No pneumothorax. Left chest tube in place. Electronically Signed   By: Fidela Salisbury M.D.   On: 06/14/2021 09:24  DG Chest Port 1 View  Result Date: 06/13/2021 CLINICAL DATA:  Status post left upper lobe lobectomy, initial encounter EXAM: PORTABLE CHEST 1 VIEW COMPARISON:  06/11/2021 FINDINGS: Check shadow is enlarged but accentuated by the frontal technique. Left-sided chest tube is noted without evidence of pneumothorax. Postsurgical changes are seen in the left upper lobe. Right lung shows mild basilar atelectasis. Central venous line is noted via the right jugular vein. No pneumothorax is seen. No bony abnormality is noted. IMPRESSION: Postoperative changes on the left with chest tube in place. No pneumothorax is noted. Mild right basilar atelectasis. Electronically Signed   By: Inez Catalina M.D.   On: 06/13/2021 16:00   ECHOCARDIOGRAM COMPLETE  Result Date: 07/05/2021    ECHOCARDIOGRAM REPORT   Patient Name:   Tara Murphy Date of Exam: 07/05/2021 Medical Rec #:  [id number]      Height:       66.7 in Accession #:    [id number]     Weight:       203.9 lb Date of Birth:  03/17/1946       BSA:          2.033 m Patient Age:    54 years       BP:           114/60 mmHg Patient Gender: F              HR:           103 bpm. Exam Location:  Inpatient Procedure: 2D Echo Indications:    Pulmonary Embolus  I26.09  History:        Patient has prior history of Echocardiogram examinations, most                 recent 11/25/2013. CT chest which showed PE with no right heart                 strain; Risk Factors:Dyslipidemia. Past history of breast                 cancer, recent lung cancer. GERD.  Sonographer:    Merrie Roof RDCS Referring Phys: [place]  1. Hyperdynamic LV function; mild AS by doppler (mean gradient 22 mmHg but partially explained by hyperdynamic LV function; visually aortic valve opens well).  2. Left ventricular ejection fraction, by estimation, is >75%. The left ventricle has hyperdynamic function. The left ventricle has no regional wall motion abnormalities. Left ventricular diastolic parameters were normal.  3. Right ventricular systolic function is normal. The right ventricular size is normal.  4. The mitral valve is normal in structure. Trivial mitral valve regurgitation. No evidence of mitral stenosis.  5. The aortic valve is tricuspid. Aortic valve regurgitation is not visualized. Mild aortic valve stenosis.  6. The inferior vena cava is normal in size with greater than 50% respiratory variability, suggesting right atrial pressure of 3 mmHg. FINDINGS  Left Ventricle: Left ventricular ejection fraction, by estimation, is >75%. The left ventricle has hyperdynamic function. The left ventricle has no regional wall motion abnormalities. The left ventricular internal cavity size was normal in size. There is no left ventricular hypertrophy. Left ventricular diastolic parameters were normal. Right Ventricle: The right ventricular size is normal. Right ventricular systolic function is normal. Left Atrium: Left atrial size was normal in size. Right Atrium: Right atrial size was normal in size. Pericardium: There is no evidence of pericardial effusion. Mitral Valve: The mitral valve is normal in structure. Trivial  mitral valve regurgitation. No evidence of mitral valve stenosis.  Tricuspid Valve: The tricuspid valve is normal in structure. Tricuspid valve regurgitation is trivial. No evidence of tricuspid stenosis. Aortic Valve: The aortic valve is tricuspid. Aortic valve regurgitation is not visualized. Mild aortic stenosis is present. Aortic valve mean gradient measures 22.0 mmHg. Aortic valve peak gradient measures 30.6 mmHg. Aortic valve area, by VTI measures 1.77 cm. Pulmonic Valve: The pulmonic valve was normal in structure. Pulmonic valve regurgitation is trivial. No evidence of pulmonic stenosis. Aorta: The aortic root is normal in size and structure. Venous: The inferior vena cava is normal in size with greater than 50% respiratory variability, suggesting right atrial pressure of 3 mmHg. IAS/Shunts: No atrial level shunt detected by color flow Doppler. Additional Comments: Hyperdynamic LV function; mild AS by doppler (mean gradient 22 mmHg but partially explained by hyperdynamic LV function; visually aortic valve opens well).  LEFT VENTRICLE PLAX 2D LVIDd:         4.30 cm   Diastology LVIDs:         2.30 cm   LV e' medial:    11.10 cm/s LV PW:         1.10 cm   LV E/e' medial:  11.4 LV IVS:        1.10 cm   LV e' lateral:   12.50 cm/s LVOT diam:     2.00 cm   LV E/e' lateral: 10.1 LV SV:         84 LV SV Index:   41 LVOT Area:     3.14 cm  RIGHT VENTRICLE RV Basal diam:  2.90 cm LEFT ATRIUM             Index        RIGHT ATRIUM           Index LA diam:        3.40 cm 1.67 cm/m   RA Area:     13.10 cm LA Vol (A2C):   52.6 ml 25.87 ml/m  RA Volume:   29.20 ml  14.36 ml/m LA Vol (A4C):   31.7 ml 15.59 ml/m LA Biplane Vol: 41.3 ml 20.32 ml/m  AORTIC VALVE AV Area (Vmax):    1.83 cm AV Area (Vmean):   1.77 cm AV Area (VTI):     1.77 cm AV Vmax:           276.67 cm/s AV Vmean:          197.333 cm/s AV VTI:            0.477 m AV Peak Grad:      30.6 mmHg AV Mean Grad:      22.0 mmHg LVOT Vmax:         161.00 cm/s LVOT Vmean:        111.000 cm/s LVOT VTI:          0.268 m LVOT/AV  VTI ratio: 0.56  AORTA Ao Root diam: 2.70 cm Ao Asc diam:  2.80 cm MITRAL VALVE MV Area (PHT): 4.63 cm     SHUNTS MV Decel Time: 164 msec     Systemic VTI:  0.27 m MV E velocity: 126.00 cm/s  Systemic Diam: 2.00 cm MV A velocity: 131.00 cm/s MV E/A ratio:  0.96 Kirk Ruths MD Electronically signed by Kirk Ruths MD Signature Date/Time: 07/05/2021/12:35:07 PM    Final    VAS [name] LOWER EXTREMITY VENOUS (DVT)  Result Date: 07/05/2021  Lower Venous DVT Study Patient  Name:  Altamese Cabal  Date of Exam:   07/05/2021 Medical Rec #: [id number]       Accession #:    [id number] Date of Birth: [birth year]        Patient Gender: F Patient Age:   10 years Exam Location:  [facility] Procedure:      VAS [name] LOWER EXTREMITY VENOUS (DVT) Referring Phys: Edouard Gikas --------------------------------------------------------------------------------  Indications: Pulmonary embolism. Other Indications: Left leg immobility. Risk Factors: Surgery 06-13-2021 Robotic assisted thorascopy- left upper lobectomy. Anticoagulation: Eliquis. Comparison       06-05-2021 Left lower extremity venous study was negative for Study:           DVT. Performing Technologist: Darlin Coco RDMS, RVT  Examination Guidelines: A complete evaluation includes B-mode imaging, spectral Doppler, color Doppler, and power Doppler as needed of all accessible portions of each vessel. Bilateral testing is considered an integral part of a complete examination. Limited examinations for reoccurring indications may be performed as noted. The reflux portion of the exam is performed with the patient in reverse Trendelenburg.  +---------+---------------+---------+-----------+----------+--------------+ RIGHT    CompressibilityPhasicitySpontaneityPropertiesThrombus Aging +---------+---------------+---------+-----------+----------+--------------+ CFV      Full           Yes      Yes                                  +---------+---------------+---------+-----------+----------+--------------+ SFJ      Full                                                        +---------+---------------+---------+-----------+----------+--------------+ FV Prox  Full                                                        +---------+---------------+---------+-----------+----------+--------------+ FV Mid   Full                                                        +---------+---------------+---------+-----------+----------+--------------+ FV DistalFull                                                        +---------+---------------+---------+-----------+----------+--------------+ PFV      Full                                                        +---------+---------------+---------+-----------+----------+--------------+ POP      Full           Yes      Yes                                 +---------+---------------+---------+-----------+----------+--------------+  PTV      Full                                                        +---------+---------------+---------+-----------+----------+--------------+ PERO     Full                                                        +---------+---------------+---------+-----------+----------+--------------+ Gastroc  Full                                                        +---------+---------------+---------+-----------+----------+--------------+   +---------+---------------+---------+-----------+----------+--------------+ LEFT     CompressibilityPhasicitySpontaneityPropertiesThrombus Aging +---------+---------------+---------+-----------+----------+--------------+ CFV      Full           Yes      Yes                                 +---------+---------------+---------+-----------+----------+--------------+ SFJ      Full                                                         +---------+---------------+---------+-----------+----------+--------------+ FV Prox  Full                                                        +---------+---------------+---------+-----------+----------+--------------+ FV Mid   Full                                                        +---------+---------------+---------+-----------+----------+--------------+ FV DistalFull                                                        +---------+---------------+---------+-----------+----------+--------------+ PFV      Full                                                        +---------+---------------+---------+-----------+----------+--------------+ POP      None           No       No  Acute          +---------+---------------+---------+-----------+----------+--------------+ PTV      None                                         Acute          +---------+---------------+---------+-----------+----------+--------------+ PERO     None                                         Acute          +---------+---------------+---------+-----------+----------+--------------+ [facility]  Full                                                        +---------+---------------+---------+-----------+----------+--------------+     Summary: RIGHT: - There is no evidence of deep vein thrombosis in the lower extremity.  - No cystic structure found in the popliteal fossa.  LEFT: - Findings consistent with acute deep vein thrombosis involving the left popliteal vein, left posterior tibial veins, and left peroneal veins. - No cystic structure found in the popliteal fossa.  *See table(s) above for measurements and observations. Electronically signed by Orlie Pollen on 07/05/2021 at 3:15:00 PM.    Final      Discharge Exam: Vitals:   07/06/21 0301 07/06/21 0730  BP: 129/63 (!) 168/143  Pulse: 78 91  Resp: 20 19  Temp: 97.7 F (36.5 C) 97.8 F (36.6 C)  SpO2: 95% 91%    Vitals:   07/05/21 2300 07/06/21 0300 07/06/21 0301 07/06/21 0730  BP: 125/67 129/63 129/63 (!) 168/143  Pulse: 96 83 78 91  Resp: 19 19 20 19   Temp: 98.5 F (36.9 C)  97.7 F (36.5 C) 97.8 F (36.6 C)  TempSrc: Oral  Oral Oral  SpO2: 92% 90% 95% 91%  Weight:      Height:        General: Pt is alert, awake, not in acute distress Cardiovascular: RRR, S1/S2 +, no rubs, no gallops Respiratory: CTA bilaterally, no wheezing, no rhonchi Abdominal: Soft, NT, ND, bowel sounds + Extremities: no edema, no cyanosis    The results of significant diagnostics from this hospitalization (including imaging, microbiology, ancillary and laboratory) are listed below for reference.     Microbiology: Recent Results (from the past 240 hour(s))  Novel Coronavirus, NAA ([facility])     Status: None   Collection Time: 07/02/21  4:50 PM   Specimen: Nasopharyngeal(NP) swabs in vial transport medium   Nasopharynge  Testing  Result Value Ref Range Status   SARS-CoV-2, NAA Not Detected Not Detected Final    Comment: This nucleic acid amplification test was developed and its performance characteristics determined by [employer]. Nucleic acid amplification tests include RT-PCR and TMA. This test has not been FDA cleared or approved. This test has been authorized by FDA under an Emergency Use Authorization (EUA). This test is only authorized for the duration of time the declaration that circumstances exist justifying the authorization of the emergency use of in vitro diagnostic tests for detection of SARS-CoV-2 virus and/or diagnosis of COVID-19 infection under section 564(b)(1) of the Act, 21 U.S.C. 295MWU-1(L) (1), unless the  authorization is terminated or revoked sooner. When diagnostic testing is negative, the possibility of a false negative result should be considered in the context of a patient's recent exposures and the presence of clinical signs and symptoms consistent with COVID-19.  An individual without symptoms of COVID-19 and who is not shedding SARS-CoV-2 virus wo uld expect to have a negative (not detected) result in this assay.   SARS-COV-2, NAA 2 DAY TAT     Status: None   Collection Time: 07/02/21  4:50 PM   Nasopharynge  Testing  Result Value Ref Range Status   SARS-CoV-2, NAA 2 DAY TAT Performed  Final  Resp Panel by RT-PCR (Flu A&B, Covid) Nasopharyngeal Swab     Status: None   Collection Time: 07/03/21  7:58 PM   Specimen: Nasopharyngeal Swab; Nasopharyngeal(NP) swabs in vial transport medium  Result Value Ref Range Status   SARS Coronavirus 2 by RT PCR NEGATIVE NEGATIVE Final    Comment: (NOTE) SARS-CoV-2 target nucleic acids are NOT DETECTED.  The SARS-CoV-2 RNA is generally detectable in upper respiratory specimens during the acute phase of infection. The lowest concentration of SARS-CoV-2 viral copies this assay can detect is 138 copies/mL. A negative result does not preclude SARS-Cov-2 infection and should not be used as the sole basis for treatment or other patient management decisions. A negative result may occur with  improper specimen collection/handling, submission of specimen other than nasopharyngeal swab, presence of viral mutation(s) within the areas targeted by this assay, and inadequate number of viral copies(<138 copies/mL). A negative result must be combined with clinical observations, patient history, and epidemiological information. The expected result is Negative.  Fact Sheet for Patients:  EntrepreneurPulse.com.au  Fact Sheet for Healthcare Providers:  IncredibleEmployment.be  This test is no t yet approved or cleared by the [place] FDA and  has been authorized for detection and/or diagnosis of SARS-CoV-2 by FDA under an Emergency Use Authorization (EUA). This EUA will remain  in effect (meaning this test can be used) for the duration of the COVID-19 declaration under Section  564(b)(1) of the Act, 21 U.S.C.section 360bbb-3(b)(1), unless the authorization is terminated  or revoked sooner.       Influenza A by PCR NEGATIVE NEGATIVE Final   Influenza B by PCR NEGATIVE NEGATIVE Final    Comment: (NOTE) The Xpert Xpress SARS-CoV-2/FLU/RSV plus assay is intended as an aid in the diagnosis of influenza from Nasopharyngeal swab specimens and should not be used as a sole basis for treatment. Nasal washings and aspirates are unacceptable for Xpert Xpress SARS-CoV-2/FLU/RSV testing.  Fact Sheet for Patients: EntrepreneurPulse.com.au  Fact Sheet for Healthcare Providers: IncredibleEmployment.be  This test is not yet approved or cleared by the [place] FDA and has been authorized for detection and/or diagnosis of SARS-CoV-2 by FDA under an Emergency Use Authorization (EUA). This EUA will remain in effect (meaning this test can be used) for the duration of the COVID-19 declaration under Section 564(b)(1) of the Act, 21 U.S.C. section 360bbb-3(b)(1), unless the authorization is terminated or revoked.  Performed at [facility], [name], [place], [place] [address]      Labs: BNP (last 3 results) No results for input(s): BNP in the last 8760 hours. Basic Metabolic Panel: Recent Labs  Lab 07/02/21 1649 07/03/21 1437 07/05/21 0216 07/06/21 0035  NA 136 136 138 138  K 4.1 3.8 3.8 3.9  CL 100 101 107 108  CO2 25 24 23 22   GLUCOSE 102* 91 111* 111*  BUN  9 13 16 9   CREATININE 0.38* 0.38* 0.64 0.40*  CALCIUM 8.7 8.9 8.0* 8.1*   Liver Function Tests: Recent Labs  Lab 07/02/21 1649  AST 25  ALT 43*  ALKPHOS 94  BILITOT 0.7  PROT 6.0  ALBUMIN 3.4*   No results for input(s): LIPASE, AMYLASE in the last 168 hours. No results for input(s): AMMONIA in the last 168 hours. CBC: Recent Labs  Lab 07/02/21 1649 07/03/21 1437 07/05/21 0216  WBC 8.7 10.1 6.7  NEUTROABS 6.7 8.2*  --    HGB 10.7* 10.5* 8.9*  HCT 31.9* 31.5* 27.9*  MCV 90.6 90.3 93.9  PLT 293.0 296 274   Cardiac Enzymes: No results for input(s): CKTOTAL, CKMB, CKMBINDEX, TROPONINI in the last 168 hours. BNP: Invalid input(s): POCBNP CBG: Recent Labs  Lab 07/04/21 1202 07/04/21 1652  GLUCAP 134* 195*   D-Dimer No results for input(s): DDIMER in the last 72 hours. Hgb A1c No results for input(s): HGBA1C in the last 72 hours. Lipid Profile No results for input(s): CHOL, HDL, LDLCALC, TRIG, CHOLHDL, LDLDIRECT in the last 72 hours. Thyroid function studies No results for input(s): TSH, T4TOTAL, T3FREE, THYROIDAB in the last 72 hours.  Invalid input(s): FREET3 Anemia work up No results for input(s): VITAMINB12, FOLATE, FERRITIN, TIBC, IRON, RETICCTPCT in the last 72 hours. Urinalysis    Component Value Date/Time   COLORURINE YELLOW 07/04/2021 [place] 07/04/2021 0047   LABSPEC 1.038 (H) 07/04/2021 0047   PHURINE 6.5 07/04/2021 [place] 07/04/2021 0047   HGBUR NEGATIVE 07/04/2021 0047   BILIRUBINUR NEGATIVE 07/04/2021 0047   BILIRUBINUR neg 04/16/2021 [place] 07/04/2021 0047   PROTEINUR NEGATIVE 07/04/2021 0047   UROBILINOGEN 0.2 (A) 04/16/2021 1504   NITRITE NEGATIVE 07/04/2021 0047   LEUKOCYTESUR NEGATIVE 07/04/2021 0047   Sepsis Labs Invalid input(s): PROCALCITONIN,  WBC,  LACTICIDVEN Microbiology Recent Results (from the past 240 hour(s))  Novel Coronavirus, NAA ([facility])     Status: None   Collection Time: 07/02/21  4:50 PM   Specimen: Nasopharyngeal(NP) swabs in vial transport medium   Nasopharynge  Testing  Result Value Ref Range Status   SARS-CoV-2, NAA Not Detected Not Detected Final    Comment: This nucleic acid amplification test was developed and its performance characteristics determined by [employer]. Nucleic acid amplification tests include RT-PCR and TMA. This test has not been FDA cleared or approved.  This test has been authorized by FDA under an Emergency Use Authorization (EUA). This test is only authorized for the duration of time the declaration that circumstances exist justifying the authorization of the emergency use of in vitro diagnostic tests for detection of SARS-CoV-2 virus and/or diagnosis of COVID-19 infection under section 564(b)(1) of the Act, 21 U.S.C. 734LPF-7(T) (1), unless the authorization is terminated or revoked sooner. When diagnostic testing is negative, the possibility of a false negative result should be considered in the context of a patient's recent exposures and the presence of clinical signs and symptoms consistent with COVID-19. An individual without symptoms of COVID-19 and who is not shedding SARS-CoV-2 virus wo uld expect to have a negative (not detected) result in this assay.   SARS-COV-2, NAA 2 DAY TAT     Status: None   Collection Time: 07/02/21  4:50 PM   Nasopharynge  Testing  Result Value Ref Range Status   SARS-CoV-2, NAA 2 DAY TAT Performed  Final  Resp Panel by RT-PCR (Flu A&B, Covid) Nasopharyngeal Swab  Status: None   Collection Time: 07/03/21  7:58 PM   Specimen: Nasopharyngeal Swab; Nasopharyngeal(NP) swabs in vial transport medium  Result Value Ref Range Status   SARS Coronavirus 2 by RT PCR NEGATIVE NEGATIVE Final    Comment: (NOTE) SARS-CoV-2 target nucleic acids are NOT DETECTED.  The SARS-CoV-2 RNA is generally detectable in upper respiratory specimens during the acute phase of infection. The lowest concentration of SARS-CoV-2 viral copies this assay can detect is 138 copies/mL. A negative result does not preclude SARS-Cov-2 infection and should not be used as the sole basis for treatment or other patient management decisions. A negative result may occur with  improper specimen collection/handling, submission of specimen other than nasopharyngeal swab, presence of viral mutation(s) within the areas targeted by this assay,  and inadequate number of viral copies(<138 copies/mL). A negative result must be combined with clinical observations, patient history, and epidemiological information. The expected result is Negative.  Fact Sheet for Patients:  EntrepreneurPulse.com.au  Fact Sheet for Healthcare Providers:  IncredibleEmployment.be  This test is no t yet approved or cleared by the [place] FDA and  has been authorized for detection and/or diagnosis of SARS-CoV-2 by FDA under an Emergency Use Authorization (EUA). This EUA will remain  in effect (meaning this test can be used) for the duration of the COVID-19 declaration under Section 564(b)(1) of the Act, 21 U.S.C.section 360bbb-3(b)(1), unless the authorization is terminated  or revoked sooner.       Influenza A by PCR NEGATIVE NEGATIVE Final   Influenza B by PCR NEGATIVE NEGATIVE Final    Comment: (NOTE) The Xpert Xpress SARS-CoV-2/FLU/RSV plus assay is intended as an aid in the diagnosis of influenza from Nasopharyngeal swab specimens and should not be used as a sole basis for treatment. Nasal washings and aspirates are unacceptable for Xpert Xpress SARS-CoV-2/FLU/RSV testing.  Fact Sheet for Patients: EntrepreneurPulse.com.au  Fact Sheet for Healthcare Providers: IncredibleEmployment.be  This test is not yet approved or cleared by the [place] FDA and has been authorized for detection and/or diagnosis of SARS-CoV-2 by FDA under an Emergency Use Authorization (EUA). This EUA will remain in effect (meaning this test can be used) for the duration of the COVID-19 declaration under Section 564(b)(1) of the Act, 21 U.S.C. section 360bbb-3(b)(1), unless the authorization is terminated or revoked.  Performed at [facility], [address] [place], [place] [address]      Time coordinating discharge: Over 30 minutes  SIGNED:   Darliss Cheney, MD  [facility] 07/06/2021, 8:56 AM  If 7PM-7AM, please contact night-coverage [email or web address]

## 2021-07-06 NOTE — Telephone Encounter (Signed)
Patient is calling in stating she was discharged from the hospital, needs to follow up within the next week. No available appointments please advise if okay to use same day.

## 2021-07-07 LAB — LEGIONELLA PNEUMOPHILA SEROGP 1 UR AG: L. pneumophila Serogp 1 Ur Ag: NEGATIVE

## 2021-07-09 ENCOUNTER — Telehealth: Payer: Self-pay

## 2021-07-09 ENCOUNTER — Ambulatory Visit: Payer: Medicare Other | Admitting: Rehabilitation

## 2021-07-09 NOTE — Telephone Encounter (Cosign Needed)
Transition Care Management Follow-up Telephone Call Date of discharge and from where: [facility] 07/06/21 How have you been since you were released from the hospital? Doing better after seizure episode  Any questions or concerns? No  Items Reviewed: Did the pt receive and understand the discharge instructions provided? Yes  Medications obtained and verified? Yes  Other? Yes Keppr will be picked up 07/09/21 Any new allergies since your discharge? No  Dietary orders reviewed? Yes Do you have support at home? Yes   Home Care and Equipment/Supplies: Were home health services ordered? yes If so, what is the name of the agency? [facility]   Has the agency set up a time to come to the patient's home?  Were any new equipment or medical supplies ordered?  Yes: O2 What is the name of the medical supply agency? [facility]  Were you able to get the supplies/equipment? yes Do you have any questions related to the use of the equipment or supplies? No  Functional Questionnaire: (I = Independent and D = Dependent) ADLs: I  Bathing/Dressing- I  Meal Prep- I  Eating- I  Maintaining continence- I  Transferring/Ambulation- I  Managing Meds- I husband assist with needs and support  Follow up appointments reviewed:  PCP Hospital f/u appt confirmed? Yes  Scheduled to see Dr Jonni Sanger   on 07/12/21 @ 11:30. [facility] f/u appt confirmed? Yes  Scheduled to see Dr Burr Medico on 07/11/21 @ 12:00. Are transportation arrangements needed? No  If their condition worsens, is the pt aware to call PCP or go to the Emergency Dept.? Yes Was the patient provided with contact information for the PCP's office or ED? Yes Was to pt encouraged to call back with questions or concerns? Yes

## 2021-07-09 NOTE — Telephone Encounter (Signed)
Patient is scheduled for the 20th.

## 2021-07-09 NOTE — Telephone Encounter (Signed)
LATE ENTRY Palliative care RN/SW visit scheduled for 07/12/21 @ 1:30 pm.

## 2021-07-10 ENCOUNTER — Other Ambulatory Visit: Payer: Self-pay | Admitting: Neurosurgery

## 2021-07-10 ENCOUNTER — Other Ambulatory Visit (HOSPITAL_COMMUNITY): Payer: Self-pay | Admitting: Neurosurgery

## 2021-07-10 ENCOUNTER — Other Ambulatory Visit: Payer: Self-pay

## 2021-07-10 ENCOUNTER — Ambulatory Visit (HOSPITAL_COMMUNITY)
Admission: RE | Admit: 2021-07-10 | Discharge: 2021-07-10 | Disposition: A | Discharge status: Home / Self Care | Payer: Medicare Other | Source: Ambulatory Visit | Attending: Neurosurgery | Admitting: Neurosurgery

## 2021-07-10 ENCOUNTER — Other Ambulatory Visit: Payer: Self-pay | Admitting: Radiation Therapy

## 2021-07-10 DIAGNOSIS — C7931 Secondary malignant neoplasm of brain: Secondary | ICD-10-CM | POA: Insufficient documentation

## 2021-07-10 DIAGNOSIS — G9389 Other specified disorders of brain: Secondary | ICD-10-CM | POA: Diagnosis not present

## 2021-07-10 MED ORDER — GADOBUTROL 1 MMOL/ML IV SOLN
9.5000 mL | Freq: Once | INTRAVENOUS | Status: AC | PRN
  Administered 2021-07-10: 9.5 mL via INTRAVENOUS

## 2021-07-11 ENCOUNTER — Inpatient Hospital Stay (HOSPITAL_BASED_OUTPATIENT_CLINIC_OR_DEPARTMENT_OTHER): Payer: Medicare Other | Admitting: Nurse Practitioner

## 2021-07-11 ENCOUNTER — Inpatient Hospital Stay: Payer: Medicare Other | Attending: Hematology | Admitting: Hematology

## 2021-07-11 ENCOUNTER — Other Ambulatory Visit: Payer: Self-pay | Admitting: Radiation Therapy

## 2021-07-11 ENCOUNTER — Encounter: Payer: Self-pay | Admitting: Hematology

## 2021-07-11 VITALS — BP 123/66 | HR 89 | Temp 98.6°F | Resp 18 | Ht 66.75 in

## 2021-07-11 DIAGNOSIS — Z8379 Family history of other diseases of the digestive system: Secondary | ICD-10-CM | POA: Insufficient documentation

## 2021-07-11 DIAGNOSIS — Z87440 Personal history of urinary (tract) infections: Secondary | ICD-10-CM | POA: Insufficient documentation

## 2021-07-11 DIAGNOSIS — Z881 Allergy status to other antibiotic agents status: Secondary | ICD-10-CM | POA: Insufficient documentation

## 2021-07-11 DIAGNOSIS — Z885 Allergy status to narcotic agent status: Secondary | ICD-10-CM | POA: Insufficient documentation

## 2021-07-11 DIAGNOSIS — R531 Weakness: Secondary | ICD-10-CM | POA: Insufficient documentation

## 2021-07-11 DIAGNOSIS — Z7289 Other problems related to lifestyle: Secondary | ICD-10-CM | POA: Insufficient documentation

## 2021-07-11 DIAGNOSIS — Z833 Family history of diabetes mellitus: Secondary | ICD-10-CM | POA: Insufficient documentation

## 2021-07-11 DIAGNOSIS — Z87891 Personal history of nicotine dependence: Secondary | ICD-10-CM | POA: Diagnosis not present

## 2021-07-11 DIAGNOSIS — Z853 Personal history of malignant neoplasm of breast: Secondary | ICD-10-CM | POA: Diagnosis not present

## 2021-07-11 DIAGNOSIS — K219 Gastro-esophageal reflux disease without esophagitis: Secondary | ICD-10-CM | POA: Diagnosis not present

## 2021-07-11 DIAGNOSIS — Z8261 Family history of arthritis: Secondary | ICD-10-CM | POA: Insufficient documentation

## 2021-07-11 DIAGNOSIS — R569 Unspecified convulsions: Secondary | ICD-10-CM | POA: Diagnosis not present

## 2021-07-11 DIAGNOSIS — Z7901 Long term (current) use of anticoagulants: Secondary | ICD-10-CM | POA: Insufficient documentation

## 2021-07-11 DIAGNOSIS — J189 Pneumonia, unspecified organism: Secondary | ICD-10-CM | POA: Insufficient documentation

## 2021-07-11 DIAGNOSIS — Z923 Personal history of irradiation: Secondary | ICD-10-CM | POA: Diagnosis not present

## 2021-07-11 DIAGNOSIS — K589 Irritable bowel syndrome without diarrhea: Secondary | ICD-10-CM | POA: Diagnosis not present

## 2021-07-11 DIAGNOSIS — Z7952 Long term (current) use of systemic steroids: Secondary | ICD-10-CM | POA: Insufficient documentation

## 2021-07-11 DIAGNOSIS — R140 Abdominal distension (gaseous): Secondary | ICD-10-CM | POA: Insufficient documentation

## 2021-07-11 DIAGNOSIS — C7931 Secondary malignant neoplasm of brain: Secondary | ICD-10-CM | POA: Diagnosis not present

## 2021-07-11 DIAGNOSIS — E785 Hyperlipidemia, unspecified: Secondary | ICD-10-CM | POA: Diagnosis not present

## 2021-07-11 DIAGNOSIS — Z800 Family history of malignant neoplasm of digestive organs: Secondary | ICD-10-CM | POA: Insufficient documentation

## 2021-07-11 DIAGNOSIS — J90 Pleural effusion, not elsewhere classified: Secondary | ICD-10-CM | POA: Diagnosis not present

## 2021-07-11 DIAGNOSIS — Z888 Allergy status to other drugs, medicaments and biological substances status: Secondary | ICD-10-CM | POA: Diagnosis not present

## 2021-07-11 DIAGNOSIS — C3412 Malignant neoplasm of upper lobe, left bronchus or lung: Secondary | ICD-10-CM | POA: Diagnosis not present

## 2021-07-11 DIAGNOSIS — M21372 Foot drop, left foot: Secondary | ICD-10-CM | POA: Diagnosis not present

## 2021-07-11 DIAGNOSIS — Z515 Encounter for palliative care: Secondary | ICD-10-CM | POA: Diagnosis not present

## 2021-07-11 DIAGNOSIS — C3492 Malignant neoplasm of unspecified part of left bronchus or lung: Secondary | ICD-10-CM | POA: Diagnosis not present

## 2021-07-11 DIAGNOSIS — Z9049 Acquired absence of other specified parts of digestive tract: Secondary | ICD-10-CM | POA: Insufficient documentation

## 2021-07-11 DIAGNOSIS — Z79899 Other long term (current) drug therapy: Secondary | ICD-10-CM | POA: Insufficient documentation

## 2021-07-11 DIAGNOSIS — G629 Polyneuropathy, unspecified: Secondary | ICD-10-CM | POA: Diagnosis not present

## 2021-07-11 DIAGNOSIS — M879 Osteonecrosis, unspecified: Secondary | ICD-10-CM | POA: Insufficient documentation

## 2021-07-11 NOTE — Progress Notes (Signed)
Patient on plan of care prior to pathways. 

## 2021-07-11 NOTE — Progress Notes (Signed)
Referral for DME [facility] and wheelchair) sent to [facility] per Penni Homans, NP.

## 2021-07-11 NOTE — Progress Notes (Addendum)
[facility]   [phone] [phone]   Clinic Follow up Note   Patient Care Team: Leamon Arnt, MD as PCP - General (Family Medicine) Loletha Carrow, Kirke Corin, MD as Consulting Physician (Gastroenterology) Ulla Gallo, MD as Consulting Physician (Dermatology) Wylene Simmer, MD as Consulting Physician (Orthopedic Surgery) Alda Berthold, DO as Consulting Physician (Neurology)  Date of Service:  07/11/2021  CHIEF COMPLAINT: f/u of metastatic lung cancer  CURRENT THERAPY:  Pending Keytruda every 3 weeks   ASSESSMENT & PLAN:  Tara Murphy is a 75 y.o. female with   1. 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 Experienced a seizure on 04/24/21 and was found to have a brain metastasis. Staging CT CAP revealed a 4.3 cm LUL suprahilar mass, 8 mm RUL nodule, no acute process or evidence of subdiaphragmatic metastases. -she had craniotomy on 04/30/21 with Dr. Annette Stable, and pathology was consistent with metastatic lung carcinoma. She subsequently received 3 doses of SRS under Dr. Tammi Klippel. -PET 06/01/21 showed primary LUL lesion, no enlarged or hypermetabolic lymph nodes or other metastatic disease. -she then underwent lobectomy on 06/13/21 with Dr. Roxan Hockey. Pathology showed non-small cell lung cancer, 3.2 cm, margins and all 9 lymph nodes were negative. -restaging brain MRI yesterday, 07/10/21, showed new 8 mm left parietal metastasis and enlargement of right frontal lesion. -she met with Dr. Annette Stable this morning.  -I will reach out to Dr. Tammi Klippel to discuss SBRT treatment to her brain mets.  -Her tumor showed high expression of PD-L1 (TPS 90%), which predicts good response to immunotherapy.  I agree with Dr. Virgina Jock and recommend [facility] every 3 weeks for up to 2 years  -Due to her recent pneumonia and PE, I plan to start Keytruda in about 2 weeks.   -Her  foundation 1 showed K-ras mutation, no other targetable mutation.  Tumor mutation burden was high (20 mut/MB) which again predicts good response to immunotherapy.   2. PE, Pneumonia -presented with SOB on 07/03/21 -she has completed a course of antibiotics -started on Eliquis and is now on oxygen as needed.  3. Left leg weakness, DVT -present since before initial presentation in 04/2021. -DVT diagnosed on 07/05/21 (not present on doppler 06/05/21)   4.Seizure,secondary to brain metastasis  -I will refer her to Dr. Mickeal Skinner for further management    PLAN: -urgent referral to Dr. Mickeal Skinner and Dr. Tammi Klippel. -plan to start Keytruda in about 2 weeks    No problem-specific Assessment & Plan notes found for this encounter.   SUMMARY OF ONCOLOGIC HISTORY: Oncology History  Breast cancer of lower-outer quadrant of left female breast ([place]) (Resolved)  10/15/2013 Surgery   Left breast lumpectomy: Well-differentiated IDC grade 1, 1.1 cm with intermediate grade DCIS solid and cribriform, margins negative, no LVID, 0/2 sentinel nodes negative, T1 cN0 M0 stage IA, Oncotype DX score 8, (6% ROR) ER/PR positive HER-2 negative   11/15/2013 - 11/16/2013 Radiation Therapy   MammoSite partial breast radiation   12/17/2013 - 08/29/2015 Anti-estrogen oral therapy   Anastrozole 1 mg daily (severe fatigue, emotional changes, skin dryness), switched to tamoxifen 06/20/2015 (stopped due to depression)      INTERVAL HISTORY:  Tara Murphy is here for a follow up of metastatic lung cancer. She was last seen by Dr. Chryl Heck on 06/06/21. She presents to the clinic accompanied by her husband and their daughter.  She came in with a wheelchair  and oxygen. She was recently hospitalized for DVT, PE, and pneumonia.  She has completed a course of antibiotics.  She is on oxygen as needed. She reports continued weakness to her left leg since prior to her diagnosis that has still not improved.   All other systems were reviewed  with the patient and are negative.  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[place])    S/p total hip replacement left   Breast cancer ([place]) 2015   left    Cataract    Cholecystolithiasis    Chronic allergic rhinitis    Depression    Former smoker, stopped smoking in distant past 10/05/2019   Quit 2000; 53 y smoking history   GERD (gastroesophageal reflux disease)    Hepatic steatosis 06/08/2020   Intermittent elevated LFTs and ultrasound 2018   Hyperlipemia    IBS (irritable bowel syndrome) 10/08/2018   Nl colonoscopy and EGD 09/2018   IBS (irritable bowel syndrome)    Lichen plano-pilaris    Lung cancer ([place])    Major depression, 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    Personal history of radiation therapy 2015   Seizures ([place])    Urinary tract infection    hx of frequent     SURGICAL HISTORY: Past Surgical History:  Procedure Laterality Date   APPENDECTOMY     APPLICATION OF CRANIAL NAVIGATION Right 04/30/2021   Procedure: APPLICATION OF CRANIAL NAVIGATION;  Surgeon: Earnie Larsson, MD;  Location: [place];  Service: Neurosurgery;  Laterality: Right;   BREAST LUMPECTOMY Left 2015   CATARACT EXTRACTION Bilateral    cataracts     CHOLECYSTECTOMY N/A 10/29/2016   Procedure: LAPAROSCOPIC CHOLECYSTECTOMY;  Surgeon: Erroll Luna, MD;  Location: [place];  Service: General;  Laterality: N/A;   COLONOSCOPY     CRANIOTOMY Right 04/30/2021   Procedure: Right Parietal Craniotomy for tumor with Brain Lab;  Surgeon: Earnie Larsson, MD;  Location: [place];  Service: Neurosurgery;  Laterality: Right;   EYE SURGERY     INTERCOSTAL NERVE BLOCK Left 06/13/2021   Procedure: INTERCOSTAL NERVE BLOCK;  Surgeon: Melrose Nakayama, MD;  Location: [place];  Service: Thoracic;  Laterality:  Left;   LIPOSUCTION     LYMPH NODE DISSECTION Left 06/13/2021   Procedure: LYMPH NODE DISSECTION;  Surgeon: Melrose Nakayama, MD;  Location: [place];  Service: Thoracic;  Laterality: Left;   TOTAL HIP ARTHROPLASTY     bilateral hip arthoplasty   TOTAL HIP ARTHROPLASTY Right 12/18/2015   Procedure: RIGHT TOTAL HIP ARTHROPLASTY ANTERIOR APPROACH;  Surgeon: Gaynelle Arabian, MD;  Location: [facility];  Service: Orthopedics;  Laterality: Right;   TOTAL KNEE ARTHROPLASTY     TOTAL KNEE ARTHROPLASTY Left 11/25/2016   Procedure: LEFT TOTAL KNEE ARTHROPLASTY;  Surgeon: Gaynelle Arabian, MD;  Location: [facility];  Service: Orthopedics;  Laterality: Left;  with abductor block   TUBAL LIGATION     UPPER GASTROINTESTINAL ENDOSCOPY      I have reviewed the social history and family history with the patient and they are unchanged from previous note.  ALLERGIES:  is allergic to fluorescein, contrast media [iodinated diagnostic agents], hydromorphone hcl, other, sulfamethoxazole-trimethoprim, fentanyl, tape, and terbinafine and related.  MEDICATIONS:  Current Outpatient Medications  Medication  Sig Dispense Refill   acetaminophen (TYLENOL) 325 MG tablet Take 1-2 tablets (325-650 mg total) by mouth every 4 (four) hours as needed for mild pain.     APIXABAN (ELIQUIS) VTE STARTER PACK (10MG AND 5MG) Take as directed on package: start with two-237m tablets twice daily for 7 days. On day 8, switch to one-552mtablet twice daily. 74 each 0   busPIRone (BUSPAR) 7.5 MG tablet Take 1 tablet (7.5 mg total) by mouth 2 (two) times daily as needed (anxiety). 30 tablet 0   clobetasol (TEMOVATE) 0.05 % external solution Apply 1 application topically daily as needed (irritation).     fluticasone (FLONASE) 50 MCG/ACT nasal spray Place 1 spray into both nostrils daily as needed for allergies or rhinitis.     gabapentin (NEURONTIN) 300 MG capsule Take 2 capsules (600 mg total) by mouth at bedtime.     levETIRAcetam (KEPPRA) 500 MG  tablet Take 500 mg by mouth 2 (two) times daily.     melatonin 3 MG TABS tablet Take 1 tablet (3 mg total) by mouth at bedtime. 30 tablet 0   Omeprazole 20 MG TBDD Take 20 mg by mouth daily as needed (indigestion). OTC     rOPINIRole (REQUIP) 4 MG tablet Take 1 tablet (4 mg total) by mouth at bedtime. 30 tablet 0   No current facility-administered medications for this visit.    PHYSICAL EXAMINATION: ECOG PERFORMANCE STATUS: 3 - Symptomatic, >50% confined to bed  Vitals:   07/11/21 1209  BP: 123/66  Pulse: 89  Resp: 18  Temp: 98.6 F (37 C)  SpO2: 99%   Wt Readings from Last 3 Encounters:  07/03/21 203 lb 14.8 oz (92.5 kg)  07/02/21 204 lb (92.5 kg)  06/20/21 207 lb 0.2 oz (93.9 kg)     GENERAL:alert, no distress and comfortable SKIN: skin color normal, no rashes or significant lesions EYES: normal, Conjunctiva are pink and non-injected, sclera clear  NEURO: alert & oriented x 3 with fluent speech  LABORATORY DATA:  I have reviewed the data as listed CBC Latest Ref Rng & Units 07/05/2021 07/03/2021 07/02/2021  WBC 4.0 - 10.5 K/uL 6.7 10.1 8.7  Hemoglobin 12.0 - 15.0 g/dL 8.9(L) 10.5(L) 10.7(L)  Hematocrit 36.0 - 46.0 % 27.9(L) 31.5(L) 31.9(L)  Platelets 150 - 400 K/uL 274 296 293.0     CMP Latest Ref Rng & Units 07/06/2021 07/05/2021 07/03/2021  Glucose 70 - 99 mg/dL 111(H) 111(H) 91  BUN 8 - 23 mg/dL 9 16 13   Creatinine 0.44 - 1.00 mg/dL 0.40(L) 0.64 0.38(L)  Sodium 135 - 145 mmol/L 138 138 136  Potassium 3.5 - 5.1 mmol/L 3.9 3.8 3.8  Chloride 98 - 111 mmol/L 108 107 101  CO2 22 - 32 mmol/L 22 23 24   Calcium 8.9 - 10.3 mg/dL 8.1(L) 8.0(L) 8.9  Total Protein 6.0 - 8.3 g/dL - - -  Total Bilirubin 0.2 - 1.2 mg/dL - - -  Alkaline Phos 39 - 117 U/L - - -  AST 0 - 37 U/L - - -  ALT 0 - 35 U/L - - -      RADIOGRAPHIC STUDIES: I have personally reviewed the radiological images as listed and agreed with the findings in the report. MR BRAIN W WO CONTRAST  Result  Date: 07/10/2021 CLINICAL DATA:  Metastatic lung cancer. EXAM: MRI HEAD WITHOUT AND WITH CONTRAST TECHNIQUE: Multiplanar, multiecho pulse sequences of the brain and surrounding structures were obtained without and with intravenous contrast. CONTRAST:  9.37m38mADAVIST  GADOBUTROL 1 MMOL/ML IV SOLN COMPARISON:  05/23/2021 FINDINGS: Brain: A peripherally enhancing lesion in the high posterior right frontal lobe with chronic blood products at the site of a previous resection has enlarged and now measures 31 x 21 mm (series 11, image 41). Mild surrounding edema is unchanged. There is a new 8 mm enhancing lesion laterally in the left parietal lobe with mild edema and susceptibility artifact compatible with blood products (series 11, image 34). Small T2 hyperintensities elsewhere in the cerebral white matter bilaterally are unchanged and nonspecific but compatible with mild chronic small vessel ischemic disease. The ventricles are normal in size. There is minimal extra-axial fluid subjacent to the craniotomy. No acute infarct or midline shift is evident. Vascular: Major intracranial vascular flow voids are preserved. Skull and upper cervical spine: Vertex craniotomy. No suspicious marrow lesion. Sinuses/Orbits: Bilateral cataract extraction. Paranasal sinuses and mastoid air cells are clear. Other: None. IMPRESSION: 1. New 8 mm left parietal metastasis. 2. Enlargement of peripherally enhancing posterior right frontal lesion. Electronically Signed   By: [name] Bores M.D.   On: 07/10/2021 18:37      No orders of the defined types were placed in this encounter.  All questions were answered. The patient knows to call the clinic with any problems, questions or concerns. No barriers to learning was detected. The total time spent in the appointment was 40 minutes.     Truitt Merle, MD 07/11/2021   I, Wilburn Mylar, am acting as scribe for Truitt Merle, MD.   I have reviewed the above documentation for accuracy and  completeness, and I agree with the above.

## 2021-07-11 NOTE — Progress Notes (Addendum)
D[facility] Murphy  [phone] [phone]) [phone]   Name: Tara Murphy Date: 07/11/2021 MRN: [id number]  DOB: [birth year]  Patient Care Team: Leamon Arnt, MD as PCP - General (Family Medicine) Loletha Carrow, Kirke Corin, MD as Consulting Physician (Gastroenterology) Ulla Gallo, MD as Consulting Physician (Dermatology) Wylene Simmer, MD as Consulting Physician (Orthopedic Surgery) Alda Berthold, DO as Consulting Physician (Neurology)    REASON FOR CONSULTATION: Tara Murphy is a 75 y.o. 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  Patient recently hospitalized and treated for pneumonia and PE.  Subsequently she was discharged home on Eliquis and oxygen.  Restaging brain MRI on 07/10/2021 showing new 8 mm left parietal metastasis and enlargement of right frontal lesion.  Patient being seen today as post hospital follow-up follow-up for ongoing goals of care and symptom management.  I personally saw patient during recent hospitalization for palliative needs.     SOCIAL HISTORY:     reports that she quit smoking about 29 years ago. Her smoking use included cigarettes. She smoked an average of 1 pack per day. She has never used smokeless tobacco. She reports current alcohol use of about 14.0 standard drinks per week. She reports that she does not use drugs.  ADVANCE DIRECTIVES:  Patient has documented advanced directives on file via [employer]. Documents personally reviewed. Patients daughter, Tara Murphy is [place].   CODE STATUS: Full code  PAST MEDICAL HISTORY: 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[place])     S/p total hip replacement left   Breast cancer ([place]) 2015   left    Cataract    Cholecystolithiasis    Chronic allergic rhinitis    Depression    Former smoker, stopped smoking in distant past 10/05/2019   Quit 2000; 60 y smoking history   GERD (gastroesophageal reflux disease)    Hepatic steatosis 06/08/2020   Intermittent elevated LFTs and ultrasound 2018   Hyperlipemia    IBS (irritable bowel syndrome) 10/08/2018   Nl colonoscopy and EGD 09/2018   IBS (irritable bowel syndrome)    Lichen plano-pilaris    Lung cancer ([place])    Major depression, 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    Personal history of radiation therapy 2015   Seizures ([place])    Urinary tract infection    hx of frequent     PAST SURGICAL HISTORY:  Past Surgical History:  Procedure Laterality Date   APPENDECTOMY     APPLICATION OF CRANIAL NAVIGATION Right 04/30/2021   Procedure: APPLICATION OF CRANIAL NAVIGATION;  Surgeon: Earnie Larsson, MD;  Location: [place];  Service: Neurosurgery;  Laterality: Right;   BREAST LUMPECTOMY Left 2015   CATARACT EXTRACTION Bilateral    cataracts     CHOLECYSTECTOMY N/A 10/29/2016   Procedure: LAPAROSCOPIC CHOLECYSTECTOMY;  Surgeon: Erroll Luna, MD;  Location: [place];  Service: General;  Laterality: N/A;   COLONOSCOPY     CRANIOTOMY Right 04/30/2021   Procedure: Right Parietal Craniotomy for tumor with Brain Lab;  Surgeon: Annette Stable,  Mallie Mussel, MD;  Location: [place];  Service: Neurosurgery;  Laterality: Right;   EYE SURGERY     INTERCOSTAL NERVE BLOCK Left 06/13/2021   Procedure: INTERCOSTAL NERVE BLOCK;  Surgeon: Melrose Nakayama, MD;  Location: [place];  Service: Thoracic;  Laterality: Left;   LIPOSUCTION     LYMPH NODE DISSECTION Left 06/13/2021   Procedure: LYMPH NODE DISSECTION;  Surgeon: Melrose Nakayama, MD;  Location: [place];   Service: Thoracic;  Laterality: Left;   TOTAL HIP ARTHROPLASTY     bilateral hip arthoplasty   TOTAL HIP ARTHROPLASTY Right 12/18/2015   Procedure: RIGHT TOTAL HIP ARTHROPLASTY ANTERIOR APPROACH;  Surgeon: Gaynelle Arabian, MD;  Location: [facility];  Service: Orthopedics;  Laterality: Right;   TOTAL KNEE ARTHROPLASTY     TOTAL KNEE ARTHROPLASTY Left 11/25/2016   Procedure: LEFT TOTAL KNEE ARTHROPLASTY;  Surgeon: Gaynelle Arabian, MD;  Location: [facility];  Service: Orthopedics;  Laterality: Left;  with abductor block   TUBAL LIGATION     UPPER GASTROINTESTINAL ENDOSCOPY      HEMATOLOGY/ONCOLOGY HISTORY:  Oncology History  Breast cancer of lower-outer quadrant of left female breast ([place]) (Resolved)  10/15/2013 Surgery   Left breast lumpectomy: Well-differentiated IDC grade 1, 1.1 cm with intermediate grade DCIS solid and cribriform, margins negative, no LVID, 0/2 sentinel nodes negative, T1 cN0 M0 stage IA, Oncotype DX score 8, (6% ROR) ER/PR positive HER-2 negative   11/15/2013 - 11/16/2013 Radiation Therapy   MammoSite partial breast radiation   12/17/2013 - 08/29/2015 Anti-estrogen oral therapy   Anastrozole 1 mg daily (severe fatigue, emotional changes, skin dryness), switched to tamoxifen 06/20/2015 (stopped due to depression)     ALLERGIES:  is allergic to fluorescein, contrast media [iodinated diagnostic agents], hydromorphone hcl, other, sulfamethoxazole-trimethoprim, fentanyl, tape, and terbinafine and related.  MEDICATIONS:  Current Outpatient Medications  Medication Sig Dispense Refill   acetaminophen (TYLENOL) 325 MG tablet Take 1-2 tablets (325-650 mg total) by mouth every 4 (four) hours as needed for mild pain.     APIXABAN (ELIQUIS) VTE STARTER PACK (10MG AND 5MG) Take as directed on package: start with two-14m tablets twice daily for 7 days. On day 8, switch to one-588mtablet twice daily. 74 each 0   busPIRone (BUSPAR) 7.5 MG tablet Take 1 tablet (7.5 mg total) by mouth 2 (two) times  daily as needed (anxiety). 30 tablet 0   clobetasol (TEMOVATE) 0.05 % external solution Apply 1 application topically daily as needed (irritation).     fluticasone (FLONASE) 50 MCG/ACT nasal spray Place 1 spray into both nostrils daily as needed for allergies or rhinitis.     gabapentin (NEURONTIN) 300 MG capsule Take 2 capsules (600 mg total) by mouth at bedtime.     levETIRAcetam (KEPPRA) 500 MG tablet Take 500 mg by mouth 2 (two) times daily.     melatonin 3 MG TABS tablet Take 1 tablet (3 mg total) by mouth at bedtime. 30 tablet 0   Omeprazole 20 MG TBDD Take 20 mg by mouth daily as needed (indigestion). OTC     rOPINIRole (REQUIP) 4 MG tablet Take 1 tablet (4 mg total) by mouth at bedtime. 30 tablet 0   No current facility-administered medications for this visit.    VITAL SIGNS: There were no vitals taken for this visit. There were no vitals filed for this visit.  Estimated body mass index is 32.18 kg/m as calculated from the following:   Height as of an earlier encounter on 07/11/21: 5' 6.75" (1.695 m).  Weight as of 07/03/21: 203 lb 14.8 oz (92.5 kg).  LABS: CBC:    Component Value Date/Time   WBC 6.7 07/05/2021 0216   HGB 8.9 (L) 07/05/2021 0216   HCT 27.9 (L) 07/05/2021 0216   PLT 274 07/05/2021 0216   MCV 93.9 07/05/2021 0216   NEUTROABS 8.2 (H) 07/03/2021 1437   LYMPHSABS 1.2 07/03/2021 1437   MONOABS 0.5 07/03/2021 1437   EOSABS 0.0 07/03/2021 1437   BASOSABS 0.1 07/03/2021 1437   Comprehensive Metabolic Panel:    Component Value Date/Time   NA 138 07/06/2021 0035   K 3.9 07/06/2021 0035   CL 108 07/06/2021 0035   CO2 22 07/06/2021 0035   BUN 9 07/06/2021 0035   CREATININE 0.40 (L) 07/06/2021 0035   CREATININE 0.69 06/07/2020 1410   GLUCOSE 111 (H) 07/06/2021 0035   CALCIUM 8.1 (L) 07/06/2021 0035   AST 25 07/02/2021 1649   ALT 43 (H) 07/02/2021 1649   ALKPHOS 94 07/02/2021 1649   BILITOT 0.7 07/02/2021 1649   PROT 6.0 07/02/2021 1649   ALBUMIN 3.4 (L)  07/02/2021 1649    RADIOGRAPHIC STUDIES: DG Chest 2 View  Result Date: 07/03/2021 CLINICAL DATA:  Shortness of breath and fatigue. EXAM: CHEST - 2 VIEW COMPARISON:  Chest x-ray 06/17/2021, CT chest 04/25/2021 FINDINGS: The heart and mediastinal contours are unchanged. Status post left upper lobectomy. No pulmonary edema. Possible increase in trace to small volume left pleural effusion. No pneumothorax. No acute osseous abnormality. IMPRESSION: Possible increase in trace to small volume left pleural effusion in a patient status post left upper lobectomy. Electronically Signed   By: Iven Finn M.D.   On: 07/03/2021 15:16   DG Chest 2 View  Result Date: 06/17/2021 CLINICAL DATA:  Follow-up pneumothorax. EXAM: CHEST - 2 VIEW COMPARISON:  06/16/2021 FINDINGS: Stable cardiac enlargement. Postop change from partial left upper lobectomy noted. 7 mm left lateral pneumothorax is identified. This appears similar in volume when compared with the previous exam. No focal pulmonary opacities. IMPRESSION: 1. No change in the appearance of left lateral pneumothorax. 2. Stable cardiac enlargement. Electronically Signed   By: Kerby Moors M.D.   On: 06/17/2021 08:44   DG Chest 2 View  Result Date: 06/16/2021 CLINICAL DATA:  Status post partial lobectomy. EXAM: CHEST - 2 VIEW COMPARISON:  06/15/2021 FINDINGS: Stable cardiac enlargement. Postsurgical changes are noted within the left upper lobe. Thin lateral pneumothorax is noted overlying the left upper lung measuring approximately 4 mm in thickness. This is unchanged in appearance when compared with previous exam from 06/15/21. Right lung is clear. IMPRESSION: Stable small left-sided pneumothorax. Electronically Signed   By: Kerby Moors M.D.   On: 06/16/2021 09:01   CT Angio Chest PE W/Cm &/Or Wo Cm  Result Date: 07/03/2021 CLINICAL DATA:  PE suspected, high prob EXAM: CT ANGIOGRAPHY CHEST WITH CONTRAST TECHNIQUE: Multidetector CT imaging of the chest was  performed using the standard protocol during bolus administration of intravenous contrast. Multiplanar CT image reconstructions and MIPs were obtained to evaluate the vascular anatomy. CONTRAST:  32m OMNIPAQUE IOHEXOL 350 MG/ML SOLN COMPARISON:  Same day radiograph, PET-CT 06/01/2021, chest CT 04/25/2021 FINDINGS: Cardiovascular: Mildly enlarged heart. Coronary artery calcifications. No pericardial disease. Normal size main pulmonary artery. The branch pulmonary arteries are mildly enlarged with tortuosity, which can be seen in chronic pulmonary hypertension. There are pulmonary emboli in the right lower lobar segmental arteries extending into the subsegmental branches. RV: LV ratio is 0.9. No evidence of right heart  strain. Mediastinum/Nodes: Prominent hilar and mediastinal lymph nodes, favored to be reactive. The thyroid is unremarkable. Esophagus is unremarkable. Lungs/Pleura: Postsurgical changes of left upper lobectomy. The remaining airways are patent. There is mild bronchial wall thickening. There are patchy ground-glass opacities bilaterally, worst in the mid to upper lungs, new since prior chest CT in August. Associated mild interlobular septal thickening. Unchanged 8 mm right upper lobe pulmonary nodule (series 6, image 45). Moderate size left pleural effusion with adjacent basilar atelectasis. Upper Abdomen: No acute findings. Musculoskeletal: No acute osseous abnormality. No suspicious lytic or blastic lesions. Review of the MIP images confirms the above findings. IMPRESSION: Right lower lobe segmental pulmonary emboli, extending into the subsegmental branches. No evidence of right heart strain. Multifocal ground-glass opacities bilaterally, worst in the mid to upper lungs, new since prior PET-CT in September, consistent with multifocal pneumonia. Appearance can be also seen with atypical viral infection including COVID-19. Recommend follow-up to resolution. Postsurgical changes of left upper lobectomy,  including a moderate size left pleural effusion. Unchanged 8 mm right upper lobe pulmonary nodule. Recommend continued oncologic follow-up with follow-up chest CT as clinically indicated. These results were called by telephone at the time of interpretation on 07/03/2021 at 9:37 pm to ED provider, who verbally acknowledged these results. Electronically Signed   By: Maurine Simmering M.D.   On: 07/03/2021 21:43   MR BRAIN W WO CONTRAST  Result Date: 07/10/2021 CLINICAL DATA:  Metastatic lung cancer. EXAM: MRI HEAD WITHOUT AND WITH CONTRAST TECHNIQUE: Multiplanar, multiecho pulse sequences of the brain and surrounding structures were obtained without and with intravenous contrast. CONTRAST:  9.62m GADAVIST GADOBUTROL 1 MMOL/ML IV SOLN COMPARISON:  05/23/2021 FINDINGS: Brain: A peripherally enhancing lesion in the high posterior right frontal lobe with chronic blood products at the site of a previous resection has enlarged and now measures 31 x 21 mm (series 11, image 41). Mild surrounding edema is unchanged. There is a new 8 mm enhancing lesion laterally in the left parietal lobe with mild edema and susceptibility artifact compatible with blood products (series 11, image 34). Small T2 hyperintensities elsewhere in the cerebral white matter bilaterally are unchanged and nonspecific but compatible with mild chronic small vessel ischemic disease. The ventricles are normal in size. There is minimal extra-axial fluid subjacent to the craniotomy. No acute infarct or midline shift is evident. Vascular: Major intracranial vascular flow voids are preserved. Skull and upper cervical spine: Vertex craniotomy. No suspicious marrow lesion. Sinuses/Orbits: Bilateral cataract extraction. Paranasal sinuses and mastoid air cells are clear. Other: None. IMPRESSION: 1. New 8 mm left parietal metastasis. 2. Enlargement of peripherally enhancing posterior right frontal lesion. Electronically Signed   By: ALogan BoresM.D.   On: 07/10/2021  18:37   DG Chest 1V REPEAT Same Day  Result Date: 06/15/2021 CLINICAL DATA:  S/P lobectomy of lung Z90.2 (ICD-10-CM) Encounter for chest tube removal Z46.82 (ICD-10-CM) EXAM: CHEST - 1 VIEW SAME DAY COMPARISON:  Same day chest radiograph. FINDINGS: Postsurgical changes in the left lung with volume loss, similar. No new consolidation. Subtle vertical linear opacity along the lateral left lung. No visible pleural effusions. Similar cardiomegaly. Similar subcutaneous emphysema along the lower left chest wall. IMPRESSION: 1. Subtle vertical linear opacity along the lateral left lung, which could represent a small pneumothorax or skin fold. Recommend repeat chest radiograph to evaluate for persistence. 2. Otherwise, similar postsurgical changes without acute cardiopulmonary disease Electronically Signed   By: FMargaretha SheffieldM.D.   On: 06/15/2021 15:16  DG CHEST PORT 1 VIEW  Result Date: 06/15/2021 CLINICAL DATA:  Status post left upper lobectomy. EXAM: PORTABLE CHEST 1 VIEW COMPARISON:  Chest x-ray from yesterday. FINDINGS: Left-sided chest tube remains in place. There is a trace left apical pneumothorax. Interval removal of the right internal jugular central venous catheter. Stable cardiomegaly. Normal pulmonary vascularity. Low lung volumes with mild bibasilar atelectasis. No pleural effusion. No acute osseous abnormality. Decreasing subcutaneous emphysema in the left lower neck and left lateral chest wall. IMPRESSION: 1. Trace left apical pneumothorax. 2. Low lung volumes with mild bibasilar atelectasis. Electronically Signed   By: Titus Dubin M.D.   On: 06/15/2021 09:26   DG Chest Port 1 View  Result Date: 06/14/2021 CLINICAL DATA:  Status post left lung partial resection, chest pain EXAM: PORTABLE CHEST 1 VIEW COMPARISON:  06/13/2021 FINDINGS: Lung volumes are small. There is hyper lucency of the left lung in keeping with partial left lung resection. Large bore left chest tube is in place. No  pneumothorax or pleural effusion. Subcutaneous gas seen at the left neck base and within the left chest wall. Right internal jugular temporary dialysis catheter tip noted within the superior cavoatrial junction. Mild cardiomegaly is stable. Fullness within the left hilum is likely postsurgical in nature. Pulmonary vascularity is normal. IMPRESSION: Pulmonary hypoinflation. Hyper lucency of the left lung in keeping with partial left lung resection. No pneumothorax. Left chest tube in place. Electronically Signed   By: Fidela Salisbury M.D.   On: 06/14/2021 09:24   DG Chest Port 1 View  Result Date: 06/13/2021 CLINICAL DATA:  Status post left upper lobe lobectomy, initial encounter EXAM: PORTABLE CHEST 1 VIEW COMPARISON:  06/11/2021 FINDINGS: Check shadow is enlarged but accentuated by the frontal technique. Left-sided chest tube is noted without evidence of pneumothorax. Postsurgical changes are seen in the left upper lobe. Right lung shows mild basilar atelectasis. Central venous line is noted via the right jugular vein. No pneumothorax is seen. No bony abnormality is noted. IMPRESSION: Postoperative changes on the left with chest tube in place. No pneumothorax is noted. Mild right basilar atelectasis. Electronically Signed   By: Inez Catalina M.D.   On: 06/13/2021 16:00   ECHOCARDIOGRAM COMPLETE  Result Date: 07/05/2021    ECHOCARDIOGRAM REPORT   Patient Name:   Tara Murphy Date of Exam: 07/05/2021 Medical Rec #:  [id number]      Height:       66.7 in Accession #:    [id number]     Weight:       203.9 lb Date of Birth:  20-Dec-1945       BSA:          2.033 m Patient Age:    3 years       BP:           114/60 mmHg Patient Gender: F              HR:           103 bpm. Exam Location:  Inpatient Procedure: 2D Echo Indications:    Pulmonary Embolus I26.09  History:        Patient has prior history of Echocardiogram examinations, most                 recent 11/25/2013. CT chest which showed PE with no right heart                  strain; Risk Factors:Dyslipidemia. Past history of breast  cancer, recent lung cancer. GERD.  Sonographer:    Merrie Roof RDCS Referring Phys: [place]  1. Hyperdynamic LV function; mild AS by doppler (mean gradient 22 mmHg but partially explained by hyperdynamic LV function; visually aortic valve opens well).  2. Left ventricular ejection fraction, by estimation, is >75%. The left ventricle has hyperdynamic function. The left ventricle has no regional wall motion abnormalities. Left ventricular diastolic parameters were normal.  3. Right ventricular systolic function is normal. The right ventricular size is normal.  4. The mitral valve is normal in structure. Trivial mitral valve regurgitation. No evidence of mitral stenosis.  5. The aortic valve is tricuspid. Aortic valve regurgitation is not visualized. Mild aortic valve stenosis.  6. The inferior vena cava is normal in size with greater than 50% respiratory variability, suggesting right atrial pressure of 3 mmHg. FINDINGS  Left Ventricle: Left ventricular ejection fraction, by estimation, is >75%. The left ventricle has hyperdynamic function. The left ventricle has no regional wall motion abnormalities. The left ventricular internal cavity size was normal in size. There is no left ventricular hypertrophy. Left ventricular diastolic parameters were normal. Right Ventricle: The right ventricular size is normal. Right ventricular systolic function is normal. Left Atrium: Left atrial size was normal in size. Right Atrium: Right atrial size was normal in size. Pericardium: There is no evidence of pericardial effusion. Mitral Valve: The mitral valve is normal in structure. Trivial mitral valve regurgitation. No evidence of mitral valve stenosis. Tricuspid Valve: The tricuspid valve is normal in structure. Tricuspid valve regurgitation is trivial. No evidence of tricuspid stenosis. Aortic Valve: The aortic valve is  tricuspid. Aortic valve regurgitation is not visualized. Mild aortic stenosis is present. Aortic valve mean gradient measures 22.0 mmHg. Aortic valve peak gradient measures 30.6 mmHg. Aortic valve area, by VTI measures 1.77 cm. Pulmonic Valve: The pulmonic valve was normal in structure. Pulmonic valve regurgitation is trivial. No evidence of pulmonic stenosis. Aorta: The aortic root is normal in size and structure. Venous: The inferior vena cava is normal in size with greater than 50% respiratory variability, suggesting right atrial pressure of 3 mmHg. IAS/Shunts: No atrial level shunt detected by color flow Doppler. Additional Comments: Hyperdynamic LV function; mild AS by doppler (mean gradient 22 mmHg but partially explained by hyperdynamic LV function; visually aortic valve opens well).  LEFT VENTRICLE PLAX 2D LVIDd:         4.30 cm   Diastology LVIDs:         2.30 cm   LV e' medial:    11.10 cm/s LV PW:         1.10 cm   LV E/e' medial:  11.4 LV IVS:        1.10 cm   LV e' lateral:   12.50 cm/s LVOT diam:     2.00 cm   LV E/e' lateral: 10.1 LV SV:         84 LV SV Index:   41 LVOT Area:     3.14 cm  RIGHT VENTRICLE RV Basal diam:  2.90 cm LEFT ATRIUM             Index        RIGHT ATRIUM           Index LA diam:        3.40 cm 1.67 cm/m   RA Area:     13.10 cm LA Vol (A2C):   52.6 ml 25.87 ml/m  RA Volume:  29.20 ml  14.36 ml/m LA Vol (A4C):   31.7 ml 15.59 ml/m LA Biplane Vol: 41.3 ml 20.32 ml/m  AORTIC VALVE AV Area (Vmax):    1.83 cm AV Area (Vmean):   1.77 cm AV Area (VTI):     1.77 cm AV Vmax:           276.67 cm/s AV Vmean:          197.333 cm/s AV VTI:            0.477 m AV Peak Grad:      30.6 mmHg AV Mean Grad:      22.0 mmHg LVOT Vmax:         161.00 cm/s LVOT Vmean:        111.000 cm/s LVOT VTI:          0.268 m LVOT/AV VTI ratio: 0.56  AORTA Ao Root diam: 2.70 cm Ao Asc diam:  2.80 cm MITRAL VALVE MV Area (PHT): 4.63 cm     SHUNTS MV Decel Time: 164 msec     Systemic VTI:  0.27 m MV E  velocity: 126.00 cm/s  Systemic Diam: 2.00 cm MV A velocity: 131.00 cm/s MV E/A ratio:  0.96 Kirk Ruths MD Electronically signed by Kirk Ruths MD Signature Date/Time: 07/05/2021/12:35:07 PM    Final    VAS [name] LOWER EXTREMITY VENOUS (DVT)  Result Date: 07/05/2021  Lower Venous DVT Study Patient Name:  SKARLET LYONS  Date of Exam:   07/05/2021 Medical Rec #: [id number]       Accession #:    [id number] Date of Birth: 09-16-1946        Patient Gender: F Patient Age:   89 years Exam Location:  [facility] Procedure:      VAS [name] LOWER EXTREMITY VENOUS (DVT) Referring Phys: RAVI PAHWANI --------------------------------------------------------------------------------  Indications: Pulmonary embolism. Other Indications: Left leg immobility. Risk Factors: Surgery 06-13-2021 Robotic assisted thorascopy- left upper lobectomy. Anticoagulation: Eliquis. Comparison       06-05-2021 Left lower extremity venous study was negative for Study:           DVT. Performing Technologist: Darlin Coco RDMS, RVT  Examination Guidelines: A complete evaluation includes B-mode imaging, spectral Doppler, color Doppler, and power Doppler as needed of all accessible portions of each vessel. Bilateral testing is considered an integral part of a complete examination. Limited examinations for reoccurring indications may be performed as noted. The reflux portion of the exam is performed with the patient in reverse Trendelenburg.  +---------+---------------+---------+-----------+----------+--------------+ RIGHT    CompressibilityPhasicitySpontaneityPropertiesThrombus Aging +---------+---------------+---------+-----------+----------+--------------+ CFV      Full           Yes      Yes                                 +---------+---------------+---------+-----------+----------+--------------+ SFJ      Full                                                         +---------+---------------+---------+-----------+----------+--------------+ FV Prox  Full                                                        +---------+---------------+---------+-----------+----------+--------------+  FV Mid   Full                                                        +---------+---------------+---------+-----------+----------+--------------+ FV DistalFull                                                        +---------+---------------+---------+-----------+----------+--------------+ PFV      Full                                                        +---------+---------------+---------+-----------+----------+--------------+ POP      Full           Yes      Yes                                 +---------+---------------+---------+-----------+----------+--------------+ PTV      Full                                                        +---------+---------------+---------+-----------+----------+--------------+ PERO     Full                                                        +---------+---------------+---------+-----------+----------+--------------+ Gastroc  Full                                                        +---------+---------------+---------+-----------+----------+--------------+   +---------+---------------+---------+-----------+----------+--------------+ LEFT     CompressibilityPhasicitySpontaneityPropertiesThrombus Aging +---------+---------------+---------+-----------+----------+--------------+ CFV      Full           Yes      Yes                                 +---------+---------------+---------+-----------+----------+--------------+ SFJ      Full                                                        +---------+---------------+---------+-----------+----------+--------------+ FV Prox  Full                                                         +---------+---------------+---------+-----------+----------+--------------+  FV Mid   Full                                                        +---------+---------------+---------+-----------+----------+--------------+ FV DistalFull                                                        +---------+---------------+---------+-----------+----------+--------------+ PFV      Full                                                        +---------+---------------+---------+-----------+----------+--------------+ POP      None           No       No                   Acute          +---------+---------------+---------+-----------+----------+--------------+ PTV      None                                         Acute          +---------+---------------+---------+-----------+----------+--------------+ PERO     None                                         Acute          +---------+---------------+---------+-----------+----------+--------------+ Gastroc  Full                                                        +---------+---------------+---------+-----------+----------+--------------+     Summary: RIGHT: - There is no evidence of deep vein thrombosis in the lower extremity.  - No cystic structure found in the popliteal fossa.  LEFT: - Findings consistent with acute deep vein thrombosis involving the left popliteal vein, left posterior tibial veins, and left peroneal veins. - No cystic structure found in the popliteal fossa.  *See table(s) above for measurements and observations. Electronically signed by Orlie Pollen on 07/05/2021 at 3:15:00 PM.    Final     PERFORMANCE STATUS (ECOG) : 2 - Symptomatic, <50% confined to bed  Review of Systems  Neurological:  Positive for dizziness and weakness.  Unless otherwise noted, a complete review of systems is negative.  Physical Exam General: NAD, pleasant female in wheelchair on oxygen Extremities: no edema, no joint  deformities, left leg weakness Neurological: Weakness but otherwise nonfocal, AAO x3, mood appropriate   IMPRESSION:  Mrs. Killilea presents today with her husband and daughter Tara Murphy for ongoing goals of care discussions. I initially saw patient during her recent hospitalization at [facility].   Patient and family reports ongoing weakness.  She is tolerating home oxygen.  Appetite is good.  Is requiring some assistance with ADLs due to weakness.  States she is pretty much confined to wheelchair or recliner due to fear of falling.  She uses a walker occasionally when ambulating to the bathroom.  Mrs. Douse reports dizziness and unsteady gait when getting up at night to use the bathroom.  Family is fearful of patient falling with awareness that they must take precautions given she is on Eliquis.  Family is requesting wheelchair and bedside commode equipment to assist with safety in the home.  Education provided on DME referral as requested.  I created space and opportunity for patient and family to share their thoughts and feelings regarding patient's current illness and newly found lesion in addition to growth of previous brain lesion.  Family shares they are overwhelmed and saddened to hear however are remaining hopeful for effective treatment options.  They are open to any and all available treatments to allow patient every opportunity to continue to thrive.  Mrs. Coen speaks to her disappointment of the MRI results.  Emotional support provided.  She is continuing to try to focus on the best but some hopefulness for stability.  She does speak to her decreased quality of life and inability to care for herself independently however is much appreciative of her family support.  She denies pain or shortness of breath.  No concerns requiring medical intervention or medications at this time for anxiety or depression.  She shares she has been able to complete a relief for her daughter which is a craft that  brings her pleasure.  Outpatient palliative via [facility] also arranged to provide community-based assistance, ongoing support, and counseling as needed.  PLAN: Per Dr. Burr Medico plans for follow-up with Dr. Mickeal Skinner and Dr. Tammi Klippel regarding future treatment options and management. DME referral for wheelchair and bedside commode. Patient and family are clear and expressed goals to continue to treat the treatable aggressively allow patient every opportunity to continue to thrive.  They are remaining hopeful for some stability.  Expressed sadness and concerns for recent MRI results. I will plan to follow-up with patient in collaboration with future oncology team appointments.      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  A cane or walker will not resolve  issue with performing activities of daily living. A wheelchair will allow patient to safely perform daily activities. Patient is not able to propel themselves in the home using a standard weight wheelchair due to arm weakness, endurance, and general weakness. Patient can self propel in the lightweight wheelchair. Length of need Lifetime. Accessories: elevating leg rests (ELRs), wheel locks, extensions and anti-tippers.   07/12/21 1239   07/12/21 0000  DME Bedside commode       Question:  Patient needs a bedside commode to treat with the following condition  Answer:  Weakness   07/12/21 1239              Patient and family expressed understanding and was in agreement with this plan. She also understands that She  can call the clinic at any time with any questions,  concerns, or complaints.     Time Total: 40 min  Visit consisted of counseling and education dealing with the complex and emotionally intense issues of symptom management and palliative care in the setting of serious and potentially life-threatening illness.Greater than 50%  of this time was spent counseling and coordinating care related to the above assessment and plan.  Signed by: Alda Lea, AGPCNP-BC Palliative Medicine Team  Pager: [phone] Amion: Bjorn Pippin

## 2021-07-11 NOTE — Progress Notes (Signed)
START ON PATHWAY REGIMEN - Non-Small Cell Lung     A cycle is every 21 days:     Pembrolizumab   **Always confirm dose/schedule in your pharmacy ordering system**  Patient Characteristics: Stage IV Metastatic, Nonsquamous, Molecular Analysis Completed, Molecular Alteration Present and Targeted Therapy Exhausted OR EGFR Exon 20+ or KRAS G12C+ or HER2+ Present and No Prior Chemo/Immunotherapy OR No Alteration Present, Initial  Chemotherapy/Immunotherapy, PS = 2, No Alteration Present, No Alteration Present, PD-L1 Expression Positive ? 50% (TPS) Therapeutic Status: Stage IV Metastatic Histology: Nonsquamous Cell Broad Molecular Profiling Status: Engineer, manufacturing Analysis Results: No Alteration Present ECOG Performance Status: 2 Chemotherapy/Immunotherapy Line of Therapy: Initial Chemotherapy/Immunotherapy EGFR Exons 18-21 Mutation Testing Status: Completed and Negative ALK Fusion/Rearrangement Testing Status: Completed and Negative BRAF V600 Mutation Testing Status: Completed and Negative KRAS G12C Mutation Testing Status: Completed and Negative MET Exon 14 Mutation Testing Status: Completed and Negative RET Fusion/Rearrangement Testing Status: Completed and Negative HER2 Mutation Testing Status: Completed and Negative NTRK Fusion/Rearrangement Testing Status: Completed and Negative ROS1 Fusion/Rearrangement Testing Status: Completed and Negative PD-L1 Expression Status: PD-L1 Positive ? 50% (TPS) Intent of Therapy: Non-Curative / Palliative Intent, Discussed with Patient

## 2021-07-12 ENCOUNTER — Other Ambulatory Visit: Payer: Medicare Other | Admitting: *Deleted

## 2021-07-12 ENCOUNTER — Other Ambulatory Visit: Payer: Medicare Other

## 2021-07-12 ENCOUNTER — Other Ambulatory Visit: Payer: Self-pay

## 2021-07-12 ENCOUNTER — Inpatient Hospital Stay: Payer: Medicare Other | Admitting: Family Medicine

## 2021-07-12 DIAGNOSIS — Z515 Encounter for palliative care: Secondary | ICD-10-CM

## 2021-07-12 NOTE — Progress Notes (Signed)
COMMUNITY PALLIATIVE CARE SW NOTE  PATIENT NAMESun Murphy DOB: 1946/05/19 MRN: [id number]  PRIMARY CARE PROVIDER: Leamon Arnt, MD  RESPONSIBLE PARTY:  Acct ID - Guarantor Home Phone Work Phone Relationship Acct Type  [id number] MIANNA, [id number]  Self P/F     [place], [place], [place] [address]   Due to the COVID-19 crisis, this virtual check-in visit was done via telephone from my office and it was initiated and consent by this patient and or family.  PLAN OF CARE and INTERVENTIONS:             GOALS OF CARE/ ADVANCE CARE PLANNING:  Goal is for patient to continue with immunotherapy and radiation. Patient is a Full Code. SOCIAL/EMOTIONAL/SPIRITUAL ASSESSMENT/ INTERVENTIONS:  SW and RN-M. Nadara Mustard completed an initial telephonic visit with patient per her request. The team provided education to her regarding palliative care services and she provided verbal consent to services. Patient has a diagnosis of lung cancer with mets to the brain. She report that she is having increased fatigue with any activity. She is also closely monitoring her o2 as it will drop with any activity or exertion to 88/89%, but is generally 92/93% at rest. Patient's o2 is generally at 2L at rest, but will turn it up to 3L when she ambulating and 4L with activities such as bathing. Patient report shortness of breath with talking. She denies pain. She report that her appetite is fair but she does make and effort to eat small meals. Her husband assist her with ADL's as she is easily SOB. Patient reports sleeping well at night. Patient report that she is open to ongoing palliative care visits/supports.  PATIENT/CAREGIVER EDUCATION/ COPING:  Patient appears to be optimistic about ongoing treatments. She has a supportive family. PERSONAL EMERGENCY PLAN:  911 can be activated for emergencies.  COMMUNITY RESOURCES COORDINATION/ HEALTH CARE NAVIGATION:  None noted. FINANCIAL/LEGAL CONCERNS/INTERVENTIONS:   None.     SOCIAL HX:  Social History   Tobacco Use   Smoking status: Former    Packs/day: 1.00    Types: Cigarettes    Quit date: 11/22/1991    Years since quitting: 29.6   Smokeless tobacco: Never  Substance Use Topics   Alcohol use: Yes    Alcohol/week: 14.0 standard drinks    Types: 14 Glasses of wine per week    Comment: not lately    CODE STATUS: Full Code ADVANCED DIRECTIVES: Yes MOST FORM COMPLETE: No HOSPICE EDUCATION PROVIDED: No  PPS: Patient needs assistance with ADL's.   Duration of visit and documentation: 60 minutes   Katheren Puller, LCSW

## 2021-07-12 NOTE — Addendum Note (Signed)
Addended by: Jimmy Footman on: 07/12/2021 12:40 PM   Modules accepted: Orders

## 2021-07-12 NOTE — Progress Notes (Signed)
[facility] COMMUNITY PALLIATIVE CARE RN NOTE  PATIENT NAMEDessa Murphy DOB: [birth year] MRN: [id number]  PRIMARY CARE PROVIDER: Leamon Arnt, MD  RESPONSIBLE PARTY:  Acct ID - Guarantor Home Phone Work Phone Relationship Acct Type  [id number] Tara Murphy, [name]  Self P/F     [place], [place], [place] [address]   Due to the COVID-19 crisis, this virtual check-in visit was done via telephone from my office and it was initiated and consent by this patient and or family.  PLAN OF CARE and INTERVENTION:  ADVANCE CARE PLANNING/GOALS OF CARE: Plan is to start immunotherapy in a few weeks and radiation. She is a Full code.  PATIENT/CAREGIVER EDUCATION: Explained palliative care services DISEASE STATUS: Joint initial palliative care visit completed with LCSW, M. Lonon via telephone with patient as she requested. She has a diagnosis of lung cancer with mets to the brain. Patient is alert and oriented x 4. She denies pain or discomfort. She has a diagnosis of  She was recently hospitalized from 07/03/21 to 07/06/21 due to a PE, Pneumonia and left lower extremity DVT. She was discharged back to home. She is currently on oxygen at 2L/min via [place]. After walking it drops to 88-89%. At rest it is usually around 92-93%. She increases her oxygen with exertion to 3L and up to 4L during her bath. She turns it back down when at rest. She is short of breath with exertion (even during conversation), tires very easily and requires frequent rest periods. Her husband assists her with ADLs. She reports that her appetite is down today but denies dysphagia. She was able to provide her health history. She has not had any seizures, headaches or nausea/vomiting. She states that a new tumor has been found and the plan is immunotherapy Beryle Flock) and radiation. She is sleeping well at night and take naps during the day. She is agreeable to future visits with palliative care.   HISTORY OF PRESENT ILLNESS:  This is a 75 yo female with a diagnosis of lung cancer with brain mets. She has history of breast cancer, seizures, hyperlipidemia, GERD, IBS and osteoarthritis. Palliative care was asked to follow patient for additional support, goals of care, and complex decision making.  CODE STATUS: Full Code ADVANCED DIRECTIVES: Y MOST FORM: no PPS: 60%   (Duration of visit and documentation    Daryl Eastern, RN BSN

## 2021-07-13 ENCOUNTER — Ambulatory Visit (HOSPITAL_BASED_OUTPATIENT_CLINIC_OR_DEPARTMENT_OTHER): Payer: Medicare Other | Admitting: Physical Therapy

## 2021-07-13 ENCOUNTER — Telehealth: Payer: Self-pay

## 2021-07-13 ENCOUNTER — Ambulatory Visit: Payer: Medicare Other | Admitting: Family Medicine

## 2021-07-13 ENCOUNTER — Encounter (HOSPITAL_BASED_OUTPATIENT_CLINIC_OR_DEPARTMENT_OTHER): Payer: Self-pay | Admitting: Physical Therapy

## 2021-07-13 NOTE — Progress Notes (Signed)
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  A cane or walker will not resolve  issue with performing activities of daily living. A wheelchair will allow patient to safely perform daily activities. Patient is not able to propel themselves in the home using a standard weight wheelchair due to arm weakness, endurance, and general weakness. Patient can self propel in the lightweight wheelchair. Length of need Lifetime.  Accessories: elevating leg rests (ELRs), wheel locks, extensions and anti-tippers.

## 2021-07-13 NOTE — Telephone Encounter (Signed)
I received a call from Ms. Zemaitis's daughter, Larene Beach following up about the DME order for a wheelchair and BSC. I called Zack Blank from [place] and was able to place a final note in for the DME to be processed. He stated that the equipment should be able to be delivered today. I relayed this information to [facility]. She verbalized understanding.

## 2021-07-16 ENCOUNTER — Inpatient Hospital Stay (HOSPITAL_BASED_OUTPATIENT_CLINIC_OR_DEPARTMENT_OTHER): Payer: Medicare Other | Admitting: Internal Medicine

## 2021-07-16 ENCOUNTER — Inpatient Hospital Stay: Payer: Medicare Other

## 2021-07-16 ENCOUNTER — Other Ambulatory Visit: Payer: Self-pay

## 2021-07-16 VITALS — BP 123/75 | HR 94 | Temp 98.1°F | Resp 18

## 2021-07-16 DIAGNOSIS — R569 Unspecified convulsions: Secondary | ICD-10-CM | POA: Diagnosis not present

## 2021-07-16 DIAGNOSIS — J189 Pneumonia, unspecified organism: Secondary | ICD-10-CM | POA: Diagnosis not present

## 2021-07-16 DIAGNOSIS — C7931 Secondary malignant neoplasm of brain: Secondary | ICD-10-CM | POA: Diagnosis not present

## 2021-07-16 DIAGNOSIS — C3412 Malignant neoplasm of upper lobe, left bronchus or lung: Secondary | ICD-10-CM | POA: Diagnosis not present

## 2021-07-16 DIAGNOSIS — M879 Osteonecrosis, unspecified: Secondary | ICD-10-CM | POA: Diagnosis not present

## 2021-07-16 DIAGNOSIS — R531 Weakness: Secondary | ICD-10-CM | POA: Diagnosis not present

## 2021-07-16 MED ORDER — DEXAMETHASONE 4 MG PO TABS: 4.0000 mg | ORAL_TABLET | Freq: Two times a day (BID) | ORAL | 1 refills | Status: DC

## 2021-07-16 NOTE — Progress Notes (Signed)
[facility] [place] [facility] [place], [place] [address] [phone]   New Patient Evaluation  Date of Service: 07/16/21 Patient Name: Tara Murphy Patient MRN: [id number] Patient DOB: Apr 19, 1946 Provider: Ventura Sellers, MD  Identifying Statement:  Tara Murphy is a 75 y.o. female with Brain metastasis ([facility])  Seizure [facility]) who presents for initial consultation and evaluation regarding cancer associated neurologic deficits.    Referring Provider: Leamon Arnt, [place],  [place] [address]  Primary Cancer:  Oncologic History: Oncology History  Breast cancer of lower-outer quadrant of left female breast ([place]) (Resolved)  10/15/2013 Surgery   Left breast lumpectomy: Well-differentiated IDC grade 1, 1.1 cm with intermediate grade DCIS solid and cribriform, margins negative, no LVID, 0/2 sentinel nodes negative, T1 cN0 M0 stage IA, Oncotype DX score 8, (6% ROR) ER/PR positive HER-2 negative   11/15/2013 - 11/16/2013 Radiation Therapy   MammoSite partial breast radiation   12/17/2013 - 08/29/2015 Anti-estrogen oral therapy   Anastrozole 1 mg daily (severe fatigue, emotional changes, skin dryness), switched to tamoxifen 06/20/2015 (stopped due to depression)   Primary cancer of left upper lobe of lung ([place])  05/03/2021 Initial Diagnosis   Primary cancer of left upper lobe of lung ([place])   07/25/2021 -  Chemotherapy   Patient is on Treatment Plan : LUNG NSCLC flat dose Pembrolizumab Q21D      CNS Oncologic History 04/30/21: Craniotomy, resection of R pareital oligometastasis ([place]) 05/31/21: Completes fractionated post-op SRS Tammi Klippel)  History of Present Illness: The patient's records from the referring physician were obtained and reviewed and the patient interviewed to confirm this HPI.  Tara Murphy presents today for follow up after seizure, recent radiosurgery, new findings on MRI scan.  She describes second  seizure, characterized as "left leg shaking and twitching for several minutes" on 07/05/21.  Initial event had been on August 2nd.  She describes worsening of left leg weakness in recent days and weeks, she currently has no meaningful use of the leg.  Uses a walker or wheelchair to ambulate/transport.  The left hand/arm is not described as weak.  She also had a recent hospitalization for pneumonia and pulmonary embolism.  For lung cancer, Dr. Burr Medico plans to initiate Keytruda monotherapy shortly, following planned radiation treatment.  Medications: Current Outpatient Medications on File Prior to Visit  Medication Sig Dispense Refill   acetaminophen (TYLENOL) 325 MG tablet Take 1-2 tablets (325-650 mg total) by mouth every 4 (four) hours as needed for mild pain.     APIXABAN (ELIQUIS) VTE STARTER PACK (10MG AND 5MG) Take as directed on package: start with two-34m tablets twice daily for 7 days. On day 8, switch to one-546mtablet twice daily. 74 each 0   busPIRone (BUSPAR) 7.5 MG tablet Take 1 tablet (7.5 mg total) by mouth 2 (two) times daily as needed (anxiety). 30 tablet 0   clobetasol (TEMOVATE) 0.05 % external solution Apply 1 application topically daily as needed (irritation).     fluticasone (FLONASE) 50 MCG/ACT nasal spray Place 1 spray into both nostrils daily as needed for allergies or rhinitis.     gabapentin (NEURONTIN) 300 MG capsule Take 2 capsules (600 mg total) by mouth at bedtime.     levETIRAcetam (KEPPRA) 500 MG tablet Take 500 mg by mouth 2 (two) times daily.     melatonin 3 MG TABS tablet Take 1 tablet (3 mg total) by mouth at bedtime. 30 tablet 0   Omeprazole  20 MG TBDD Take 20 mg by mouth daily as needed (indigestion). OTC     rOPINIRole (REQUIP) 4 MG tablet Take 1 tablet (4 mg total) by mouth at bedtime. 30 tablet 0   [DISCONTINUED] atorvastatin (LIPITOR) 10 MG tablet Take 1 tablet (10 mg total) by mouth 3 (three) times a week. (Patient not taking: No sig reported) 36 tablet 3    No current facility-administered medications on file prior to visit.    Allergies:  Allergies  Allergen Reactions   Fluorescein Itching and Other (See Comments)   Contrast Media [Iodinated Diagnostic Agents] Hives   Hydromorphone Hcl Nausea Only   Other Rash    Certain Band-Aids don't agree with the patient's skin; hospital ID wristband causes rashes   Sulfamethoxazole-Trimethoprim Hives   Fentanyl Other (See Comments)    Redness and flushing over body    Tape Rash    Certain Band-Aids don't agree with the patient's skin; hospital ID wristband causes rashes   Terbinafine And Related Hives and Rash   Past Medical History:  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[place])    S/p total hip replacement left   Breast cancer ([facility]) 2015   left    Cataract    Cholecystolithiasis    Chronic allergic rhinitis    Depression    Former smoker, stopped smoking in distant past 10/05/2019   Quit 2000; 55 y smoking history   GERD (gastroesophageal reflux disease)    Hepatic steatosis 06/08/2020   Intermittent elevated LFTs and ultrasound 2018   Hyperlipemia    IBS (irritable bowel syndrome) 10/08/2018   Nl colonoscopy and EGD 09/2018   IBS (irritable bowel syndrome)    Lichen plano-pilaris    Lung cancer ([place])    Major depression, 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    Personal history of radiation therapy 2015   Seizures ([facility])    Urinary tract infection    hx of frequent    Past Surgical History:  Past Surgical History:  Procedure Laterality Date   APPENDECTOMY     APPLICATION OF CRANIAL NAVIGATION Right 04/30/2021   Procedure: APPLICATION OF CRANIAL NAVIGATION;  Surgeon: Earnie Larsson, MD;  Location: [facility];  Service: Neurosurgery;  Laterality: Right;   BREAST LUMPECTOMY Left 2015    CATARACT EXTRACTION Bilateral    cataracts     CHOLECYSTECTOMY N/A 10/29/2016   Procedure: LAPAROSCOPIC CHOLECYSTECTOMY;  Surgeon: Erroll Luna, MD;  Location: [facility];  Service: General;  Laterality: N/A;   COLONOSCOPY     CRANIOTOMY Right 04/30/2021   Procedure: Right Parietal Craniotomy for tumor with Brain Lab;  Surgeon: Earnie Larsson, MD;  Location: [facility];  Service: Neurosurgery;  Laterality: Right;   EYE SURGERY     INTERCOSTAL NERVE BLOCK Left 06/13/2021   Procedure: INTERCOSTAL NERVE BLOCK;  Surgeon: Melrose Nakayama, MD;  Location: [facility];  Service: Thoracic;  Laterality: Left;   LIPOSUCTION     LYMPH NODE DISSECTION Left 06/13/2021   Procedure: LYMPH NODE DISSECTION;  Surgeon: Melrose Nakayama, MD;  Location: [facility];  Service: Thoracic;  Laterality: Left;   TOTAL HIP ARTHROPLASTY     bilateral hip arthoplasty   TOTAL HIP ARTHROPLASTY Right 12/18/2015   Procedure: RIGHT TOTAL HIP ARTHROPLASTY ANTERIOR APPROACH;  Surgeon: Gaynelle Arabian, MD;  Location: [facility];  Service: Orthopedics;  Laterality: Right;   TOTAL KNEE ARTHROPLASTY     TOTAL KNEE ARTHROPLASTY Left 11/25/2016   Procedure: LEFT TOTAL KNEE ARTHROPLASTY;  Surgeon: Gaynelle Arabian, MD;  Location: [facility];  Service: Orthopedics;  Laterality: Left;  with abductor block   TUBAL LIGATION     UPPER GASTROINTESTINAL ENDOSCOPY     Social History:  Social History   Socioeconomic History   Marital status: Married    Spouse name: Not on file   Number of children: 2   Years of education: Not on file   Highest education level: Not on file  Occupational History   Occupation: Retired     Comment: Oceanographer   Tobacco Use   Smoking status: Former    Packs/day: 1.00    Types: Cigarettes    Quit date: 11/22/1991    Years since quitting: 29.6   Smokeless tobacco: Never  Vaping Use   Vaping Use: Never used  Substance and Sexual Activity   Alcohol use: Yes    Alcohol/week: 14.0 standard drinks    Types: 14 Glasses  of wine per week    Comment: not lately   Drug use: No   Sexual activity: Not Currently    Birth control/protection: Post-menopausal  Other Topics Concern   Not on file  Social History Narrative   4 grandchildren and 3 step    Enjoys painting, and gardening, and home decorating    Right Handed   Has a Radiation protection practitioner    Lives one story home    Social Determinants of Health   Financial Resource Strain: Not on file  Food Insecurity: Not on file  Transportation Needs: Not on file  Physical Activity: Not on file  Stress: Not on file  Social Connections: Not on file  Intimate Partner Violence: Not on file   Family History:  Family History  Problem Relation Age of Onset   Arthritis Mother    Diabetes Mother    Crohn's disease Mother    Arthritis Father    Lung cancer Father    Breast cancer Sister 33   Hyperlipidemia Maternal Aunt    Stomach cancer Maternal Grandmother    Colitis Neg Hx    Rectal cancer Neg Hx    Esophageal cancer Neg Hx     Review of Systems: Constitutional: Doesn't report fevers, chills or abnormal weight loss Eyes: Doesn't report blurriness of vision Ears, nose, mouth, throat, and face: Doesn't report sore throat Respiratory: Doesn't report cough, dyspnea or wheezes Cardiovascular: Doesn't report palpitation, chest discomfort  Gastrointestinal:  Doesn't report nausea, constipation, diarrhea GU: Doesn't report incontinence Skin: Doesn't report skin rashes Neurological: Per HPI Musculoskeletal: Doesn't report joint pain Behavioral/Psych: Doesn't report anxiety  Physical Exam: Vitals:   07/16/21 1421  BP: 123/75  Pulse: 94  Resp: 18  Temp: 98.1 F (36.7 C)  SpO2: 93%   KPS: 80. General: Alert, cooperative, pleasant, in no acute distress Head: Normal EENT: No conjunctival injection or scleral icterus.  Lungs: Resp effort normal Cardiac: Regular rate Abdomen: Non-distended abdomen Skin: No rashes cyanosis or petechiae. Extremities: No  clubbing or edema  Neurologic Exam: Mental Status: Awake, alert, attentive to examiner. Oriented to self and environment. Language is fluent with intact comprehension.  Cranial Nerves: Visual acuity is grossly normal. Visual fields are full. Extra-ocular movements intact. No ptosis. Face is symmetric Motor: Tone and bulk are normal. Power is full in both arms and legs.  Reflexes are symmetric, no pathologic reflexes present.  Sensory: Intact to light touch Gait: Normal.   Labs: I have reviewed the data as listed    Component Value Date/Time   NA 138 07/06/2021 0035   K 3.9 07/06/2021 0035   CL 108 07/06/2021 0035   CO2 22 07/06/2021 0035   GLUCOSE 111 (H) 07/06/2021 0035   BUN 9 07/06/2021 0035   CREATININE 0.40 (L) 07/06/2021 0035   CREATININE 0.69 06/07/2020 1410   CALCIUM 8.1 (L) 07/06/2021 0035   PROT 6.0 07/02/2021 1649   ALBUMIN 3.4 (L) 07/02/2021 1649   AST 25 07/02/2021 1649   ALT 43 (H) 07/02/2021 1649   ALKPHOS 94 07/02/2021 1649   BILITOT 0.7 07/02/2021 1649   GFRNONAA >60 07/06/2021 0035   GFRNONAA 86 06/07/2020 1410   GFRAA 100 06/07/2020 1410   Lab Results  Component Value Date   WBC 6.7 07/05/2021   NEUTROABS 8.2 (H) 07/03/2021   HGB 8.9 (L) 07/05/2021   HCT 27.9 (L) 07/05/2021   MCV 93.9 07/05/2021   PLT 274 07/05/2021    Imaging:  DG Chest 2 View  Result Date: 07/03/2021 CLINICAL DATA:  Shortness of breath and fatigue. EXAM: CHEST - 2 VIEW COMPARISON:  Chest x-ray 06/17/2021, CT chest 04/25/2021 FINDINGS: The heart and mediastinal contours are unchanged. Status post left upper lobectomy. No pulmonary edema. Possible increase in trace to small volume left pleural effusion. No pneumothorax. No acute osseous abnormality. IMPRESSION: Possible increase in trace to small volume left pleural effusion in a patient status post left upper lobectomy. Electronically Signed   By: Iven Finn M.D.   On: 07/03/2021 15:16   DG Chest 2 View  Result Date:  06/17/2021 CLINICAL DATA:  Follow-up pneumothorax. EXAM: CHEST - 2 VIEW COMPARISON:  06/16/2021 FINDINGS: Stable cardiac enlargement. Postop change from partial left upper lobectomy noted. 7 mm left lateral pneumothorax is identified. This appears similar in volume when compared with the previous exam. No focal pulmonary opacities. IMPRESSION: 1. No change in the appearance of left lateral pneumothorax. 2. Stable cardiac enlargement. Electronically Signed   By: Kerby Moors M.D.   On: 06/17/2021 08:44   CT Angio Chest PE W/Cm &/Or Wo Cm  Result Date: 07/03/2021 CLINICAL DATA:  PE suspected, high prob EXAM: CT ANGIOGRAPHY CHEST WITH CONTRAST TECHNIQUE: Multidetector CT imaging of the chest was performed using the standard protocol during bolus administration of intravenous contrast. Multiplanar CT image reconstructions and MIPs were obtained to evaluate the vascular anatomy. CONTRAST:  44m OMNIPAQUE IOHEXOL 350 MG/ML SOLN COMPARISON:  Same day radiograph, PET-CT 06/01/2021, chest CT 04/25/2021 FINDINGS: Cardiovascular: Mildly enlarged heart. Coronary artery calcifications. No pericardial disease. Normal size main pulmonary artery. The branch pulmonary arteries are mildly enlarged with tortuosity, which can be seen in chronic pulmonary hypertension. There are pulmonary emboli in the right lower lobar segmental arteries extending into the subsegmental branches. RV: LV ratio is 0.9. No evidence of right heart strain. Mediastinum/Nodes: Prominent hilar and mediastinal lymph nodes, favored to be reactive. The thyroid is unremarkable. Esophagus is unremarkable. Lungs/Pleura: Postsurgical changes of left upper lobectomy. The remaining airways are patent. There is mild bronchial wall thickening. There are patchy ground-glass opacities bilaterally, worst in the mid to upper lungs, new since prior chest CT in August. Associated mild interlobular septal thickening. Unchanged 8 mm right upper lobe pulmonary nodule  (series 6, image 45). Moderate size left pleural effusion with adjacent basilar atelectasis. Upper Abdomen: No acute findings. Musculoskeletal: No acute osseous  abnormality. No suspicious lytic or blastic lesions. Review of the MIP images confirms the above findings. IMPRESSION: Right lower lobe segmental pulmonary emboli, extending into the subsegmental branches. No evidence of right heart strain. Multifocal ground-glass opacities bilaterally, worst in the mid to upper lungs, new since prior PET-CT in September, consistent with multifocal pneumonia. Appearance can be also seen with atypical viral infection including COVID-19. Recommend follow-up to resolution. Postsurgical changes of left upper lobectomy, including a moderate size left pleural effusion. Unchanged 8 mm right upper lobe pulmonary nodule. Recommend continued oncologic follow-up with follow-up chest CT as clinically indicated. These results were called by telephone at the time of interpretation on 07/03/2021 at 9:37 pm to ED provider, who verbally acknowledged these results. Electronically Signed   By: Maurine Simmering M.D.   On: 07/03/2021 21:43   MR BRAIN W WO CONTRAST  Result Date: 07/10/2021 CLINICAL DATA:  Metastatic lung cancer. EXAM: MRI HEAD WITHOUT AND WITH CONTRAST TECHNIQUE: Multiplanar, multiecho pulse sequences of the brain and surrounding structures were obtained without and with intravenous contrast. CONTRAST:  9.74m GADAVIST GADOBUTROL 1 MMOL/ML IV SOLN COMPARISON:  05/23/2021 FINDINGS: Brain: A peripherally enhancing lesion in the high posterior right frontal lobe with chronic blood products at the site of a previous resection has enlarged and now measures 31 x 21 mm (series 11, image 41). Mild surrounding edema is unchanged. There is a new 8 mm enhancing lesion laterally in the left parietal lobe with mild edema and susceptibility artifact compatible with blood products (series 11, image 34). Small T2 hyperintensities elsewhere in the  cerebral white matter bilaterally are unchanged and nonspecific but compatible with mild chronic small vessel ischemic disease. The ventricles are normal in size. There is minimal extra-axial fluid subjacent to the craniotomy. No acute infarct or midline shift is evident. Vascular: Major intracranial vascular flow voids are preserved. Skull and upper cervical spine: Vertex craniotomy. No suspicious marrow lesion. Sinuses/Orbits: Bilateral cataract extraction. Paranasal sinuses and mastoid air cells are clear. Other: None. IMPRESSION: 1. New 8 mm left parietal metastasis. 2. Enlargement of peripherally enhancing posterior right frontal lesion. Electronically Signed   By: ALogan BoresM.D.   On: 07/10/2021 18:37   ECHOCARDIOGRAM COMPLETE  Result Date: 07/05/2021    ECHOCARDIOGRAM REPORT   Patient Name:   GNEVEYAH GARZONDate of Exam: 07/05/2021 Medical Rec #:  [id number]     Height:       66.7 in Accession #:    [id number]    Weight:       203.9 lb Date of Birth:  5Sep 07, 1947      BSA:          2.033 m Patient Age:    89years       BP:           114/60 mmHg Patient Gender: F              HR:           103 bpm. Exam Location:  Inpatient Procedure: 2D Echo Indications:    Pulmonary Embolus I26.09  History:        Patient has prior history of Echocardiogram examinations, most                 recent 11/25/2013. CT chest which showed PE with no right heart                 strain; Risk Factors:Dyslipidemia. Past history of breast  cancer, recent lung cancer. GERD.  Sonographer:    Merrie Roof RDCS Referring Phys: [place]  1. Hyperdynamic LV function; mild AS by doppler (mean gradient 22 mmHg but partially explained by hyperdynamic LV function; visually aortic valve opens well).  2. Left ventricular ejection fraction, by estimation, is >75%. The left ventricle has hyperdynamic function. The left ventricle has no regional wall motion abnormalities. Left ventricular diastolic  parameters were normal.  3. Right ventricular systolic function is normal. The right ventricular size is normal.  4. The mitral valve is normal in structure. Trivial mitral valve regurgitation. No evidence of mitral stenosis.  5. The aortic valve is tricuspid. Aortic valve regurgitation is not visualized. Mild aortic valve stenosis.  6. The inferior vena cava is normal in size with greater than 50% respiratory variability, suggesting right atrial pressure of 3 mmHg. FINDINGS  Left Ventricle: Left ventricular ejection fraction, by estimation, is >75%. The left ventricle has hyperdynamic function. The left ventricle has no regional wall motion abnormalities. The left ventricular internal cavity size was normal in size. There is no left ventricular hypertrophy. Left ventricular diastolic parameters were normal. Right Ventricle: The right ventricular size is normal. Right ventricular systolic function is normal. Left Atrium: Left atrial size was normal in size. Right Atrium: Right atrial size was normal in size. Pericardium: There is no evidence of pericardial effusion. Mitral Valve: The mitral valve is normal in structure. Trivial mitral valve regurgitation. No evidence of mitral valve stenosis. Tricuspid Valve: The tricuspid valve is normal in structure. Tricuspid valve regurgitation is trivial. No evidence of tricuspid stenosis. Aortic Valve: The aortic valve is tricuspid. Aortic valve regurgitation is not visualized. Mild aortic stenosis is present. Aortic valve mean gradient measures 22.0 mmHg. Aortic valve peak gradient measures 30.6 mmHg. Aortic valve area, by VTI measures 1.77 cm. Pulmonic Valve: The pulmonic valve was normal in structure. Pulmonic valve regurgitation is trivial. No evidence of pulmonic stenosis. Aorta: The aortic root is normal in size and structure. Venous: The inferior vena cava is normal in size with greater than 50% respiratory variability, suggesting right atrial pressure of 3 mmHg.  IAS/Shunts: No atrial level shunt detected by color flow Doppler. Additional Comments: Hyperdynamic LV function; mild AS by doppler (mean gradient 22 mmHg but partially explained by hyperdynamic LV function; visually aortic valve opens well).  LEFT VENTRICLE PLAX 2D LVIDd:         4.30 cm   Diastology LVIDs:         2.30 cm   LV e' medial:    11.10 cm/s LV PW:         1.10 cm   LV E/e' medial:  11.4 LV IVS:        1.10 cm   LV e' lateral:   12.50 cm/s LVOT diam:     2.00 cm   LV E/e' lateral: 10.1 LV SV:         84 LV SV Index:   41 LVOT Area:     3.14 cm  RIGHT VENTRICLE RV Basal diam:  2.90 cm LEFT ATRIUM             Index        RIGHT ATRIUM           Index LA diam:        3.40 cm 1.67 cm/m   RA Area:     13.10 cm LA Vol (A2C):   52.6 ml 25.87 ml/m  RA Volume:  29.20 ml  14.36 ml/m LA Vol (A4C):   31.7 ml 15.59 ml/m LA Biplane Vol: 41.3 ml 20.32 ml/m  AORTIC VALVE AV Area (Vmax):    1.83 cm AV Area (Vmean):   1.77 cm AV Area (VTI):     1.77 cm AV Vmax:           276.67 cm/s AV Vmean:          197.333 cm/s AV VTI:            0.477 m AV Peak Grad:      30.6 mmHg AV Mean Grad:      22.0 mmHg LVOT Vmax:         161.00 cm/s LVOT Vmean:        111.000 cm/s LVOT VTI:          0.268 m LVOT/AV VTI ratio: 0.56  AORTA Ao Root diam: 2.70 cm Ao Asc diam:  2.80 cm MITRAL VALVE MV Area (PHT): 4.63 cm     SHUNTS MV Decel Time: 164 msec     Systemic VTI:  0.27 m MV E velocity: 126.00 cm/s  Systemic Diam: 2.00 cm MV A velocity: 131.00 cm/s MV E/A ratio:  0.96 Kirk Ruths MD Electronically signed by Kirk Ruths MD Signature Date/Time: 07/05/2021/12:35:07 PM    Final    VAS [name] LOWER EXTREMITY VENOUS (DVT)  Result Date: 07/05/2021  Lower Venous DVT Study Patient Name:  TARRIE MCMICHEN  Date of Exam:   07/05/2021 Medical Rec #: [id number]       Accession #:    [id number] Date of Birth: [birth year]        Patient Gender: F Patient Age:   25 years Exam Location:  [facility] Procedure:      VAS [name] LOWER  EXTREMITY VENOUS (DVT) Referring Phys: RAVI PAHWANI --------------------------------------------------------------------------------  Indications: Pulmonary embolism. Other Indications: Left leg immobility. Risk Factors: Surgery 06-13-2021 Robotic assisted thorascopy- left upper lobectomy. Anticoagulation: Eliquis. Comparison       06-05-2021 Left lower extremity venous study was negative for Study:           DVT. Performing Technologist: Darlin Coco RDMS, RVT  Examination Guidelines: A complete evaluation includes B-mode imaging, spectral Doppler, color Doppler, and power Doppler as needed of all accessible portions of each vessel. Bilateral testing is considered an integral part of a complete examination. Limited examinations for reoccurring indications may be performed as noted. The reflux portion of the exam is performed with the patient in reverse Trendelenburg.  +---------+---------------+---------+-----------+----------+--------------+ RIGHT    CompressibilityPhasicitySpontaneityPropertiesThrombus Aging +---------+---------------+---------+-----------+----------+--------------+ CFV      Full           Yes      Yes                                 +---------+---------------+---------+-----------+----------+--------------+ SFJ      Full                                                        +---------+---------------+---------+-----------+----------+--------------+ FV Prox  Full                                                        +---------+---------------+---------+-----------+----------+--------------+  FV Mid   Full                                                        +---------+---------------+---------+-----------+----------+--------------+ FV DistalFull                                                        +---------+---------------+---------+-----------+----------+--------------+ PFV      Full                                                         +---------+---------------+---------+-----------+----------+--------------+ POP      Full           Yes      Yes                                 +---------+---------------+---------+-----------+----------+--------------+ PTV      Full                                                        +---------+---------------+---------+-----------+----------+--------------+ PERO     Full                                                        +---------+---------------+---------+-----------+----------+--------------+ Gastroc  Full                                                        +---------+---------------+---------+-----------+----------+--------------+   +---------+---------------+---------+-----------+----------+--------------+ LEFT     CompressibilityPhasicitySpontaneityPropertiesThrombus Aging +---------+---------------+---------+-----------+----------+--------------+ CFV      Full           Yes      Yes                                 +---------+---------------+---------+-----------+----------+--------------+ SFJ      Full                                                        +---------+---------------+---------+-----------+----------+--------------+ FV Prox  Full                                                        +---------+---------------+---------+-----------+----------+--------------+  FV Mid   Full                                                        +---------+---------------+---------+-----------+----------+--------------+ FV DistalFull                                                        +---------+---------------+---------+-----------+----------+--------------+ PFV      Full                                                        +---------+---------------+---------+-----------+----------+--------------+ POP      None           No       No                   Acute           +---------+---------------+---------+-----------+----------+--------------+ PTV      None                                         Acute          +---------+---------------+---------+-----------+----------+--------------+ PERO     None                                         Acute          +---------+---------------+---------+-----------+----------+--------------+ Gastroc  Full                                                        +---------+---------------+---------+-----------+----------+--------------+     Summary: RIGHT: - There is no evidence of deep vein thrombosis in the lower extremity.  - No cystic structure found in the popliteal fossa.  LEFT: - Findings consistent with acute deep vein thrombosis involving the left popliteal vein, left posterior tibial veins, and left peroneal veins. - No cystic structure found in the popliteal fossa.  *See table(s) above for measurements and observations. Electronically signed by Orlie Pollen on 07/05/2021 at 3:15:00 PM.    Final       Assessment/Plan Brain metastasis ([place])  Seizure ([place])  Elinora Klayman presents with clinical and radiographic syndrome localizing to the right frontal lobe.  She additionally has focus of enhancement within left parietal lobe consistent with likely metastasis.    We discussed her case in brain/spine tumor board, recommending radiosurgery for new lesion.  Treated right frontal lesion can be observed given proximity to radiation.  She is agreeable with this plan, has appt for CT-sim set up for tomorrow AM.  For seizures, should continue Keppra 524m BID.  Reviewed epilepsy safety and restrictions today.  For worsening left  leg weakness, recommended a trial of corticosteroids.  Will start with decadron 83m BID x4 days, then decrease to 477mdaily thereafter.  This dose can be adjusted in accordance with immunotherapy as needed, once scheduled.   We spent twenty additional minutes teaching regarding the  natural history, biology, and historical experience in the treatment of neurologic complications of cancer.   We [facility] opportunity [facility] We will touch base with her early next week via phone to continue decadron titration.  All questions were answered. The patient knows to call the clinic with any problems, questions or concerns. No barriers to learning were detected.  The total time spent in the encounter was 40 minutes and more than 50% was on counseling and review of test results   ZaVentura SellersMD Medical Director of Neuro-Oncology [facility] WeGraham0/24/22 2:21 PM

## 2021-07-17 ENCOUNTER — Ambulatory Visit: Payer: Self-pay | Admitting: Thoracic Surgery (Cardiothoracic Vascular Surgery)

## 2021-07-17 ENCOUNTER — Inpatient Hospital Stay: Admission: RE | Admit: 2021-07-17 | Payer: Medicare Other | Source: Ambulatory Visit

## 2021-07-17 ENCOUNTER — Other Ambulatory Visit: Payer: Medicare Other

## 2021-07-17 ENCOUNTER — Telehealth: Payer: Self-pay | Admitting: Internal Medicine

## 2021-07-17 ENCOUNTER — Telehealth: Payer: Self-pay

## 2021-07-17 ENCOUNTER — Other Ambulatory Visit: Payer: Self-pay | Admitting: *Deleted

## 2021-07-17 ENCOUNTER — Other Ambulatory Visit: Payer: Self-pay | Admitting: Radiation Therapy

## 2021-07-17 DIAGNOSIS — C7931 Secondary malignant neoplasm of brain: Secondary | ICD-10-CM

## 2021-07-17 NOTE — Telephone Encounter (Signed)
Scheduled per 10/24 los, pt has been called and confirmed appt

## 2021-07-17 NOTE — Progress Notes (Signed)
[facility] SRS machine is down.  Need to get new order for Tara Murphy to have done prior to visit with [place].

## 2021-07-17 NOTE — Telephone Encounter (Signed)
Tara Murphy daughter, Tara Murphy, called to notify me that the 3T imaging appointment Tara Murphy had today was cancelled due to the machine being down at [name]. She asked if I would be able to schedule her at [name]. I reached out to [name], RN for further follow up. She stated she would reach out to [name] with more information.

## 2021-07-18 ENCOUNTER — Ambulatory Visit (HOSPITAL_COMMUNITY)
Admission: RE | Admit: 2021-07-18 | Discharge: 2021-07-18 | Disposition: A | Discharge status: Home / Self Care | Payer: Medicare Other | Source: Ambulatory Visit | Attending: Radiation Oncology | Admitting: Radiation Oncology

## 2021-07-18 ENCOUNTER — Other Ambulatory Visit: Payer: Self-pay

## 2021-07-18 DIAGNOSIS — G936 Cerebral edema: Secondary | ICD-10-CM | POA: Diagnosis not present

## 2021-07-18 DIAGNOSIS — C7931 Secondary malignant neoplasm of brain: Secondary | ICD-10-CM | POA: Diagnosis not present

## 2021-07-18 DIAGNOSIS — G9389 Other specified disorders of brain: Secondary | ICD-10-CM | POA: Diagnosis not present

## 2021-07-18 MED ORDER — GADOBUTROL 1 MMOL/ML IV SOLN
9.0000 mL | Freq: Once | INTRAVENOUS | Status: AC | PRN
  Administered 2021-07-18: 9 mL via INTRAVENOUS

## 2021-07-19 ENCOUNTER — Ambulatory Visit
Admission: RE | Admit: 2021-07-19 | Discharge: 2021-07-19 | Disposition: A | Discharge status: Home / Self Care | Payer: Medicare Other | Source: Ambulatory Visit | Attending: Radiation Oncology | Admitting: Radiation Oncology

## 2021-07-19 ENCOUNTER — Other Ambulatory Visit: Payer: Self-pay

## 2021-07-19 ENCOUNTER — Ambulatory Visit
Admission: RE | Admit: 2021-07-19 | Discharge: 2021-07-19 | Disposition: A | Discharge status: Home / Self Care | Payer: Medicare Other | Source: Ambulatory Visit

## 2021-07-19 ENCOUNTER — Ambulatory Visit
Admission: RE | Admit: 2021-07-19 | Discharge: 2021-07-19 | Disposition: A | Discharge status: Home / Self Care | Payer: Medicare Other | Source: Ambulatory Visit | Attending: Urology | Admitting: Urology

## 2021-07-19 ENCOUNTER — Encounter: Payer: Self-pay | Admitting: Radiation Oncology

## 2021-07-19 VITALS — BP 131/67 | HR 78 | Temp 97.9°F | Resp 20 | Ht 66.75 in

## 2021-07-19 DIAGNOSIS — K589 Irritable bowel syndrome without diarrhea: Secondary | ICD-10-CM | POA: Insufficient documentation

## 2021-07-19 DIAGNOSIS — C3412 Malignant neoplasm of upper lobe, left bronchus or lung: Secondary | ICD-10-CM | POA: Insufficient documentation

## 2021-07-19 DIAGNOSIS — K219 Gastro-esophageal reflux disease without esophagitis: Secondary | ICD-10-CM | POA: Insufficient documentation

## 2021-07-19 DIAGNOSIS — Z09 Encounter for follow-up examination after completed treatment for conditions other than malignant neoplasm: Secondary | ICD-10-CM

## 2021-07-19 DIAGNOSIS — C7931 Secondary malignant neoplasm of brain: Secondary | ICD-10-CM

## 2021-07-19 DIAGNOSIS — K760 Fatty (change of) liver, not elsewhere classified: Secondary | ICD-10-CM | POA: Insufficient documentation

## 2021-07-19 DIAGNOSIS — Z87891 Personal history of nicotine dependence: Secondary | ICD-10-CM | POA: Insufficient documentation

## 2021-07-19 DIAGNOSIS — Z803 Family history of malignant neoplasm of breast: Secondary | ICD-10-CM | POA: Insufficient documentation

## 2021-07-19 DIAGNOSIS — E785 Hyperlipidemia, unspecified: Secondary | ICD-10-CM | POA: Diagnosis not present

## 2021-07-19 DIAGNOSIS — M879 Osteonecrosis, unspecified: Secondary | ICD-10-CM | POA: Diagnosis not present

## 2021-07-19 DIAGNOSIS — C3490 Malignant neoplasm of unspecified part of unspecified bronchus or lung: Secondary | ICD-10-CM

## 2021-07-19 NOTE — [facility] Notes (Signed)
[place]         [phone] ________________________________  Outpatient Re-Consultation  Name: Sabiha Sura MRN: [id number]  Date: 07/19/2021  DOB: 1946-07-30  REFERRING PHYSICIAN: Earnie Larsson, MD  DIAGNOSIS: 75 yo woman s/p resection of a solitary right frontal brain metastasis from primary adenocarcinoma of the left upper lung, now with 2 new brain metastases.    ICD-10-CM   1. Brain metastasis ([place])  C79.31       HISTORY OF PRESENT ILLNESS::Laterica Marten is a 75 y.o. 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  She initially presented to the ED on 04/24/21 with left hemiparesis.  Head CT without contrast revealed a 2.2 cm hemorrhagic posterior right frontal brain metastasis:  MRI brain with and without contrast confirmed a 2.6 cm hemorrhagic lesion with surrounding edema.    Suspecting the brain lesion to be a metastasis, CT Chest/Abd/Pelvis was performed on 04/25/21 and revealed a 4.3 cm spiculated left upper lung lesion and a an 8 mm right upper lung pulmonary nodule.   She underwent bilateral parietal craniotomy with resection of the brain metastasis under the care of Dr. Annette Stable on 04/30/2021.  Pathology confirmed adenocarcinoma consistent with primary lung cancer.  Molecular testing shows the tumor to be 90% PD-L1 positive.  We met with her on 05/21/21 to discuss potential postoperative radiation treatment options for management of the solitary brain metastasis. She was in agreement to proceed with fractionated stereotactic radiosurgery [facility]) which was completed over 3 fractions from 05/25/21 - 05/31/21 and tolerated very well. A PET scan was performed on 06/01/2021 to complete her disease staging and this confirmed the left upper lobe pulmonary lesion to be hypermetabolic and consistent with primary lung neoplasm but no enlarged or hypermetabolic mediastinal or hilar lymph nodes were identified and there was a stable appearance of the 7 mm  right upper lobe pulmonary nodule. She met with Dr. Roxan Hockey and was felt to be a surgical candidate for resection of the left upper lobe lung mass so on 06/13/21 she underwent a robotic assisted LUL lobectomy with lymph node dissection which she tolerated well. Final surgical pathology confirmed a pT2aN0, poorly differentiated NSCLC with rhabdoid features.  Her surgical margins were negative and 10 of 10 lymph nodes sampled were benign.  She had an uneventful hospital course and was discharged home from the inpatient rehab center on 06/29/2021.  She felt like she was not making good progress regarding her functional status at home and became more short of breath so she presented to the emergency department at [facility] on 07/03/21 and was found to have a PE and multifocal pneumonia.  On the CT angio chest, there was no evidence of any persistent or progressive disease in the lungs related to the lung cancer.  She was admitted to [facility] and treated with IV antibiotics and discharged home on 07/06/2021.  She continued with significant left lower extremity weakness and reported an episode of "left leg shaking and twitching for several minutes" on 07/05/21.  Therefore, she was referred to Dr. Mickeal Skinner for further evaluation and management of her persistent neurologic deficits and seizure activity.  She had a repeat brain MRI on 07/10/2021 which unfortunately showed a new 8 mm left parietal metastasis.  She met with Dr. Burr Medico on 07/11/21 and the plan is to start [place] on 07/24/21. At the time of her consult visit with Dr. Mickeal Skinner on 07/16/21, she was started on Decadron 4 mg p.o. twice  daily for 4 days and beginning tomorrow, she will decrease to 4 mg p.o. daily, for the worsening left lower extremity weakness. Apparently the Keppra that she had been taking for seizure prophylaxis was discontinued while she was in inpatient rehab so this was restarted on 07/16/21 at $RemoveBef'500mg'FHAsTaWNdo$  po twice daily as  directed by Dr. Mickeal Skinner. She reports that she got confused about her medication dosages and has been taking the Keppra twice daily as prescribed but has been taking two $RemoveBe'4mg'JTaDeyqRV$  Decadron tablets in the morning and two at night. She reports that she has been feeling well in general and has not had any further seizure activity, headaches or other new neurologic complaints. She continues with LLE weakness but thinks this may be slightly improved since starting the steroids.  She presents back to [name] today to discuss the potential radiation options for management of the new 8 mm left parietal metastasis as well as a brand-new 4 mm left parietal metastasis noted on her most recent MRI brain scan with SRS protocol performed on 07/18/2021 for treatment planning purposes.  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+, PR+, HER-2-)  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[place])    S/p total hip replacement left   Breast cancer ([place]) 2015   left    Cataract    Cholecystolithiasis    Chronic allergic rhinitis    Depression    Former smoker, stopped smoking in distant past 10/05/2019   Quit 2000; 43 y smoking history   GERD (gastroesophageal reflux disease)    Hepatic steatosis 06/08/2020   Intermittent elevated LFTs and ultrasound 2018   Hyperlipemia    IBS (irritable bowel syndrome) 10/08/2018   Nl colonoscopy and EGD 09/2018   IBS (irritable bowel syndrome)    Lichen plano-pilaris    Lung cancer ([place])    Major depression, chronic 09/05/2015   Neuropathy, peripheral, idiopathic    uses gabapentin   Osteopenia 09/07/2019   Dexa 08/2019: T = -1.2 at  wrist; back and hips excluded (DJD and hardware).    Personal history of radiation therapy 2015   Seizures ([place])    Urinary tract infection    hx of frequent   :   Past Surgical History:  Procedure Laterality Date   APPENDECTOMY     APPLICATION OF CRANIAL NAVIGATION Right 04/30/2021   Procedure: APPLICATION OF CRANIAL NAVIGATION;  Surgeon: Earnie Larsson, MD;  Location: [place];  Service: Neurosurgery;  Laterality: Right;   BREAST LUMPECTOMY Left 2015   CATARACT EXTRACTION Bilateral    cataracts     CHOLECYSTECTOMY N/A 10/29/2016   Procedure: LAPAROSCOPIC CHOLECYSTECTOMY;  Surgeon: Erroll Luna, MD;  Location: [place];  Service: General;  Laterality: N/A;   COLONOSCOPY     CRANIOTOMY Right 04/30/2021   Procedure: Right Parietal Craniotomy for tumor with Brain Lab;  Surgeon: Earnie Larsson, MD;  Location: [place];  Service: Neurosurgery;  Laterality: Right;   EYE SURGERY     INTERCOSTAL NERVE BLOCK Left 06/13/2021   Procedure: INTERCOSTAL NERVE BLOCK;  Surgeon: Melrose Nakayama, MD;  Location: [place];  Service: Thoracic;  Laterality: Left;   LIPOSUCTION  LYMPH NODE DISSECTION Left 06/13/2021   Procedure: LYMPH NODE DISSECTION;  Surgeon: Melrose Nakayama, MD;  Location: [place];  Service: Thoracic;  Laterality: Left;   TOTAL HIP ARTHROPLASTY     bilateral hip arthoplasty   TOTAL HIP ARTHROPLASTY Right 12/18/2015   Procedure: RIGHT TOTAL HIP ARTHROPLASTY ANTERIOR APPROACH;  Surgeon: Gaynelle Arabian, MD;  Location: [facility];  Service: Orthopedics;  Laterality: Right;   TOTAL KNEE ARTHROPLASTY     TOTAL KNEE ARTHROPLASTY Left 11/25/2016   Procedure: LEFT TOTAL KNEE ARTHROPLASTY;  Surgeon: Gaynelle Arabian, MD;  Location: [facility];  Service: Orthopedics;  Laterality: Left;  with abductor block   TUBAL LIGATION     UPPER GASTROINTESTINAL ENDOSCOPY    :   Current Outpatient Medications:    acetaminophen (TYLENOL) 325 MG tablet, Take 1-2 tablets (325-650 mg total) by mouth every 4 (four) hours as  needed for mild pain., Disp: , Rfl:    APIXABAN (ELIQUIS) VTE STARTER PACK ($RemoveBefor'10MG'OMakxyNzaOCe$  AND $Re'5MG'mat$ ), Take as directed on package: start with two-$RemoveBefore'5mg'IMqcwESVwynQy$  tablets twice daily for 7 days. On day 8, switch to one-$RemoveBefor'5mg'oTqOjEAQBdjE$  tablet twice daily., Disp: 74 each, Rfl: 0   busPIRone (BUSPAR) 7.5 MG tablet, Take 1 tablet (7.5 mg total) by mouth 2 (two) times daily as needed (anxiety)., Disp: 30 tablet, Rfl: 0   clobetasol (TEMOVATE) 0.05 % external solution, Apply 1 application topically daily as needed (irritation)., Disp: , Rfl:    dexamethasone (DECADRON) 4 MG tablet, Take 1 tablet (4 mg total) by mouth 2 (two) times daily with a meal., Disp: 60 tablet, Rfl: 1   fluticasone (FLONASE) 50 MCG/ACT nasal spray, Place 1 spray into both nostrils daily as needed for allergies or rhinitis., Disp: , Rfl:    gabapentin (NEURONTIN) 300 MG capsule, Take 2 capsules (600 mg total) by mouth at bedtime., Disp: , Rfl:    levETIRAcetam (KEPPRA) 500 MG tablet, Take 500 mg by mouth 2 (two) times daily., Disp: , Rfl:    melatonin 3 MG TABS tablet, Take 1 tablet (3 mg total) by mouth at bedtime., Disp: 30 tablet, Rfl: 0   Omeprazole 20 MG TBDD, Take 20 mg by mouth daily as needed (indigestion). OTC, Disp: , Rfl:    rOPINIRole (REQUIP) 4 MG tablet, Take 1 tablet (4 mg total) by mouth at bedtime., Disp: 30 tablet, Rfl: 0:   Allergies  Allergen Reactions   Fluorescein Itching and Other (See Comments)   Contrast Media [Iodinated Diagnostic Agents] Hives   Hydromorphone Hcl Nausea Only   Other Rash    Certain Band-Aids don't agree with the patient's skin; hospital ID wristband causes rashes   Sulfamethoxazole-Trimethoprim Hives   Fentanyl Other (See Comments)    Redness and flushing over body    Tape Rash    Certain Band-Aids don't agree with the patient's skin; hospital ID wristband causes rashes   Terbinafine And Related Hives and Rash  :   Family History  Problem Relation Age of Onset   Arthritis Mother    Diabetes Mother    Crohn's  disease Mother    Arthritis Father    Lung cancer Father    Breast cancer Sister 64   Hyperlipidemia Maternal Aunt    Stomach cancer Maternal Grandmother    Colitis Neg Hx    Rectal cancer Neg Hx    Esophageal cancer Neg Hx   :   Social History   Socioeconomic History   Marital status: Married    Spouse name: Not on file  Number of children: 2   Years of education: Not on file   Highest education level: Not on file  Occupational History   Occupation: Retired     Comment: Oceanographer   Tobacco Use   Smoking status: Former    Packs/day: 1.00    Types: Cigarettes    Quit date: 11/22/1991    Years since quitting: 29.6   Smokeless tobacco: Never  Vaping Use   Vaping Use: Never used  Substance and Sexual Activity   Alcohol use: Yes    Alcohol/week: 14.0 standard drinks    Types: 14 Glasses of wine per week    Comment: not lately   Drug use: No   Sexual activity: Not Currently    Birth control/protection: Post-menopausal  Other Topics Concern   Not on file  Social History Narrative   4 grandchildren and 3 step    Enjoys painting, and gardening, and home decorating    Right Handed   Has a [place] Puppy    Lives one story home    Social Determinants of Health   Financial Resource Strain: Not on file  Food Insecurity: Not on file  Transportation Needs: Not on file  Physical Activity: Not on file  Stress: Not on file  Social Connections: Not on file  Intimate Partner Violence: Not on file  :  REVIEW OF SYSTEMS:  A 15 point review of systems is documented in the electronic medical record. This was obtained by the nursing staff. However, I reviewed this with the patient to discuss relevant findings and make appropriate changes.  Pertinent items are noted in HPI.   PHYSICAL EXAM:  There were no vitals taken for this visit. In general this is a well appearing Caucasian female in no acute distress. She's alert and oriented x4 and appropriate throughout the  examination. Cardiopulmonary assessment is negative for acute distress and she exhibits normal effort.    KPS = 90  100 - Normal; no complaints; no evidence of disease. 90   - Able to carry on normal activity; minor signs or symptoms of disease. 80   - Normal activity with effort; some signs or symptoms of disease. 38   - Cares for self; unable to carry on normal activity or to do active work. 60   - Requires occasional assistance, but is able to care for most of his personal needs. 50   - Requires considerable assistance and frequent medical care. 40   - Disabled; requires special care and assistance. 38   - Severely disabled; hospital admission is indicated although death not imminent. 61   - Very sick; hospital admission necessary; active supportive treatment necessary. 10   - Moribund; fatal processes progressing rapidly. 0     - Dead  Karnofsky DA, Abelmann Seth Ward, Craver LS and Burchenal [facility] [phone]) The use of the nitrogen mustards in the palliative treatment of carcinoma: with particular reference to bronchogenic carcinoma Cancer 1 [id number]  LABORATORY DATA:  Lab Results  Component Value Date   WBC 6.7 07/05/2021   HGB 8.9 (L) 07/05/2021   HCT 27.9 (L) 07/05/2021   MCV 93.9 07/05/2021   PLT 274 07/05/2021   Lab Results  Component Value Date   NA 138 07/06/2021   K 3.9 07/06/2021   CL 108 07/06/2021   CO2 22 07/06/2021   Lab Results  Component Value Date   ALT 43 (H) 07/02/2021   AST 25 07/02/2021   ALKPHOS 94 07/02/2021   BILITOT 0.7 07/02/2021  RADIOGRAPHY: DG Chest 2 View  Result Date: 07/03/2021 CLINICAL DATA:  Shortness of breath and fatigue. EXAM: CHEST - 2 VIEW COMPARISON:  Chest x-ray 06/17/2021, CT chest 04/25/2021 FINDINGS: The heart and mediastinal contours are unchanged. Status post left upper lobectomy. No pulmonary edema. Possible increase in trace to small volume left pleural effusion. No pneumothorax. No acute osseous abnormality. IMPRESSION: Possible  increase in trace to small volume left pleural effusion in a patient status post left upper lobectomy. Electronically Signed   By: Iven Finn M.D.   On: 07/03/2021 15:16   CT Angio Chest PE W/Cm &/Or Wo Cm  Result Date: 07/03/2021 CLINICAL DATA:  PE suspected, high prob EXAM: CT ANGIOGRAPHY CHEST WITH CONTRAST TECHNIQUE: Multidetector CT imaging of the chest was performed using the standard protocol during bolus administration of intravenous contrast. Multiplanar CT image reconstructions and MIPs were obtained to evaluate the vascular anatomy. CONTRAST:  79mL OMNIPAQUE IOHEXOL 350 MG/ML SOLN COMPARISON:  Same day radiograph, PET-CT 06/01/2021, chest CT 04/25/2021 FINDINGS: Cardiovascular: Mildly enlarged heart. Coronary artery calcifications. No pericardial disease. Normal size main pulmonary artery. The branch pulmonary arteries are mildly enlarged with tortuosity, which can be seen in chronic pulmonary hypertension. There are pulmonary emboli in the right lower lobar segmental arteries extending into the subsegmental branches. RV: LV ratio is 0.9. No evidence of right heart strain. Mediastinum/Nodes: Prominent hilar and mediastinal lymph nodes, favored to be reactive. The thyroid is unremarkable. Esophagus is unremarkable. Lungs/Pleura: Postsurgical changes of left upper lobectomy. The remaining airways are patent. There is mild bronchial wall thickening. There are patchy ground-glass opacities bilaterally, worst in the mid to upper lungs, new since prior chest CT in August. Associated mild interlobular septal thickening. Unchanged 8 mm right upper lobe pulmonary nodule (series 6, image 45). Moderate size left pleural effusion with adjacent basilar atelectasis. Upper Abdomen: No acute findings. Musculoskeletal: No acute osseous abnormality. No suspicious lytic or blastic lesions. Review of the MIP images confirms the above findings. IMPRESSION: Right lower lobe segmental pulmonary emboli, extending into  the subsegmental branches. No evidence of right heart strain. Multifocal ground-glass opacities bilaterally, worst in the mid to upper lungs, new since prior PET-CT in September, consistent with multifocal pneumonia. Appearance can be also seen with atypical viral infection including COVID-19. Recommend follow-up to resolution. Postsurgical changes of left upper lobectomy, including a moderate size left pleural effusion. Unchanged 8 mm right upper lobe pulmonary nodule. Recommend continued oncologic follow-up with follow-up chest CT as clinically indicated. These results were called by telephone at the time of interpretation on 07/03/2021 at 9:37 pm to ED provider, who verbally acknowledged these results. Electronically Signed   By: Maurine Simmering M.D.   On: 07/03/2021 21:43   MR Brain W Wo Contrast  Result Date: 07/18/2021 CLINICAL DATA:  Metastatic lung cancer, radiation treatment planning EXAM: MRI HEAD WITHOUT AND WITH CONTRAST TECHNIQUE: Multiplanar, multiecho pulse sequences of the brain and surrounding structures were obtained without and with intravenous contrast. CONTRAST:  53mL GADAVIST GADOBUTROL 1 MMOL/ML IV SOLN COMPARISON:  Brain MRI 07/10/2021 FINDINGS: Brain: Postsurgical changes reflecting right parietal craniotomy are again seen. Mild pachymeningeal thickening underlying the craniotomy site is favored to reflect postsurgical change. A 2.2 cm by 1.8 cm by 2.6 peripherally enhancing lesion in the right frontal lobe is decreased in size, previously measured 2.6 cm x 2.1 cm by 2.8. Intrinsic T1 hyperintensity and SWI signal dropout within the lesion or consistent with blood products. Edema surrounding this lesion is not significantly changed.  The 8 mm by 4 mm enhancing lesion in the left parietal lobe is not significantly changed in size. This lesion also demonstrates blood products. Mild edema surrounding this lesion is minimally increased since 07/10/2021. There is a 4 mm rounded enhancing lesion in  the left parietal lobe white matter which was not definitely present on the prior study (1100-201) There is no other definite new lesion. There is no evidence of acute infarct. The ventricles are stable in size. There is no midline shift. Vascular: Normal flow voids. Skull and upper cervical spine: Normal marrow signal. Sinuses/Orbits: The paranasal sinuses are clear. Bilateral lens implants are in place. The globes and orbits are otherwise unremarkable. Other: None. IMPRESSION: 1. Slightly decreased size of the peripherally enhancing lesion in the right frontal lobe at the site of previously resected metastasis. Edema surrounding this lesion is not significantly changed. 2. Unchanged 8 mm hemorrhagic lesion in the left parietal lobe with slightly increased surrounding vasogenic edema. 3. New 4 mm lesion in the left parietal lobe white matter. Electronically Signed   By: Valetta Mole M.D.   On: 07/18/2021 14:36   MR BRAIN W WO CONTRAST  Result Date: 07/10/2021 CLINICAL DATA:  Metastatic lung cancer. EXAM: MRI HEAD WITHOUT AND WITH CONTRAST TECHNIQUE: Multiplanar, multiecho pulse sequences of the brain and surrounding structures were obtained without and with intravenous contrast. CONTRAST:  9.54mL GADAVIST GADOBUTROL 1 MMOL/ML IV SOLN COMPARISON:  05/23/2021 FINDINGS: Brain: A peripherally enhancing lesion in the high posterior right frontal lobe with chronic blood products at the site of a previous resection has enlarged and now measures 31 x 21 mm (series 11, image 41). Mild surrounding edema is unchanged. There is a new 8 mm enhancing lesion laterally in the left parietal lobe with mild edema and susceptibility artifact compatible with blood products (series 11, image 34). Small T2 hyperintensities elsewhere in the cerebral white matter bilaterally are unchanged and nonspecific but compatible with mild chronic small vessel ischemic disease. The ventricles are normal in size. There is minimal extra-axial fluid  subjacent to the craniotomy. No acute infarct or midline shift is evident. Vascular: Major intracranial vascular flow voids are preserved. Skull and upper cervical spine: Vertex craniotomy. No suspicious marrow lesion. Sinuses/Orbits: Bilateral cataract extraction. Paranasal sinuses and mastoid air cells are clear. Other: None. IMPRESSION: 1. New 8 mm left parietal metastasis. 2. Enlargement of peripherally enhancing posterior right frontal lesion. Electronically Signed   By: [name] Bores M.D.   On: 07/10/2021 18:37   ECHOCARDIOGRAM COMPLETE  Result Date: 07/05/2021    ECHOCARDIOGRAM REPORT   Patient Name:   CANDANCE BOHLMAN Date of Exam: 07/05/2021 Medical Rec #:  [id number]      Height:       66.7 in Accession #:    [id number]     Weight:       203.9 lb Date of Birth:  06-Sep-1946       BSA:          2.033 m Patient Age:    81 years       BP:           114/60 mmHg Patient Gender: F              HR:           103 bpm. Exam Location:  Inpatient Procedure: 2D Echo Indications:    Pulmonary Embolus I26.09  History:        Patient has prior history of Echocardiogram examinations, most  recent 11/25/2013. CT chest which showed PE with no right heart                 strain; Risk Factors:Dyslipidemia. Past history of breast                 cancer, recent lung cancer. GERD.  Sonographer:    Merrie Roof RDCS Referring Phys: [place]  1. Hyperdynamic LV function; mild AS by doppler (mean gradient 22 mmHg but partially explained by hyperdynamic LV function; visually aortic valve opens well).  2. Left ventricular ejection fraction, by estimation, is >75%. The left ventricle has hyperdynamic function. The left ventricle has no regional wall motion abnormalities. Left ventricular diastolic parameters were normal.  3. Right ventricular systolic function is normal. The right ventricular size is normal.  4. The mitral valve is normal in structure. Trivial mitral valve regurgitation. No evidence  of mitral stenosis.  5. The aortic valve is tricuspid. Aortic valve regurgitation is not visualized. Mild aortic valve stenosis.  6. The inferior vena cava is normal in size with greater than 50% respiratory variability, suggesting right atrial pressure of 3 mmHg. FINDINGS  Left Ventricle: Left ventricular ejection fraction, by estimation, is >75%. The left ventricle has hyperdynamic function. The left ventricle has no regional wall motion abnormalities. The left ventricular internal cavity size was normal in size. There is no left ventricular hypertrophy. Left ventricular diastolic parameters were normal. Right Ventricle: The right ventricular size is normal. Right ventricular systolic function is normal. Left Atrium: Left atrial size was normal in size. Right Atrium: Right atrial size was normal in size. Pericardium: There is no evidence of pericardial effusion. Mitral Valve: The mitral valve is normal in structure. Trivial mitral valve regurgitation. No evidence of mitral valve stenosis. Tricuspid Valve: The tricuspid valve is normal in structure. Tricuspid valve regurgitation is trivial. No evidence of tricuspid stenosis. Aortic Valve: The aortic valve is tricuspid. Aortic valve regurgitation is not visualized. Mild aortic stenosis is present. Aortic valve mean gradient measures 22.0 mmHg. Aortic valve peak gradient measures 30.6 mmHg. Aortic valve area, by VTI measures 1.77 cm. Pulmonic Valve: The pulmonic valve was normal in structure. Pulmonic valve regurgitation is trivial. No evidence of pulmonic stenosis. Aorta: The aortic root is normal in size and structure. Venous: The inferior vena cava is normal in size with greater than 50% respiratory variability, suggesting right atrial pressure of 3 mmHg. IAS/Shunts: No atrial level shunt detected by color flow Doppler. Additional Comments: Hyperdynamic LV function; mild AS by doppler (mean gradient 22 mmHg but partially explained by hyperdynamic LV function;  visually aortic valve opens well).  LEFT VENTRICLE PLAX 2D LVIDd:         4.30 cm   Diastology LVIDs:         2.30 cm   LV e' medial:    11.10 cm/s LV PW:         1.10 cm   LV E/e' medial:  11.4 LV IVS:        1.10 cm   LV e' lateral:   12.50 cm/s LVOT diam:     2.00 cm   LV E/e' lateral: 10.1 LV SV:         84 LV SV Index:   41 LVOT Area:     3.14 cm  RIGHT VENTRICLE RV Basal diam:  2.90 cm LEFT ATRIUM             Index  RIGHT ATRIUM           Index LA diam:        3.40 cm 1.67 cm/m   RA Area:     13.10 cm LA Vol (A2C):   52.6 ml 25.87 ml/m  RA Volume:   29.20 ml  14.36 ml/m LA Vol (A4C):   31.7 ml 15.59 ml/m LA Biplane Vol: 41.3 ml 20.32 ml/m  AORTIC VALVE AV Area (Vmax):    1.83 cm AV Area (Vmean):   1.77 cm AV Area (VTI):     1.77 cm AV Vmax:           276.67 cm/s AV Vmean:          197.333 cm/s AV VTI:            0.477 m AV Peak Grad:      30.6 mmHg AV Mean Grad:      22.0 mmHg LVOT Vmax:         161.00 cm/s LVOT Vmean:        111.000 cm/s LVOT VTI:          0.268 m LVOT/AV VTI ratio: 0.56  AORTA Ao Root diam: 2.70 cm Ao Asc diam:  2.80 cm MITRAL VALVE MV Area (PHT): 4.63 cm     SHUNTS MV Decel Time: 164 msec     Systemic VTI:  0.27 m MV E velocity: 126.00 cm/s  Systemic Diam: 2.00 cm MV A velocity: 131.00 cm/s MV E/A ratio:  0.96 Kirk Ruths MD Electronically signed by Kirk Ruths MD Signature Date/Time: 07/05/2021/12:35:07 PM    Final    VAS [name] LOWER EXTREMITY VENOUS (DVT)  Result Date: 07/05/2021  Lower Venous DVT Study Patient Name:  ALPHA CHOUINARD  Date of Exam:   07/05/2021 Medical Rec #: [id number]       Accession #:    [id number] Date of Birth: 06-22-1946        Patient Gender: F Patient Age:   23 years Exam Location:  [facility] Procedure:      VAS [name] LOWER EXTREMITY VENOUS (DVT) Referring Phys: RAVI PAHWANI --------------------------------------------------------------------------------  Indications: Pulmonary embolism. Other Indications: Left leg immobility. Risk  Factors: Surgery 06-13-2021 Robotic assisted thorascopy- left upper lobectomy. Anticoagulation: Eliquis. Comparison       06-05-2021 Left lower extremity venous study was negative for Study:           DVT. Performing Technologist: Darlin Coco RDMS, RVT  Examination Guidelines: A complete evaluation includes B-mode imaging, spectral Doppler, color Doppler, and power Doppler as needed of all accessible portions of each vessel. Bilateral testing is considered an integral part of a complete examination. Limited examinations for reoccurring indications may be performed as noted. The reflux portion of the exam is performed with the patient in reverse Trendelenburg.  +---------+---------------+---------+-----------+----------+--------------+ RIGHT    CompressibilityPhasicitySpontaneityPropertiesThrombus Aging +---------+---------------+---------+-----------+----------+--------------+ CFV      Full           Yes      Yes                                 +---------+---------------+---------+-----------+----------+--------------+ SFJ      Full                                                        +---------+---------------+---------+-----------+----------+--------------+  FV Prox  Full                                                        +---------+---------------+---------+-----------+----------+--------------+ FV Mid   Full                                                        +---------+---------------+---------+-----------+----------+--------------+ FV DistalFull                                                        +---------+---------------+---------+-----------+----------+--------------+ PFV      Full                                                        +---------+---------------+---------+-----------+----------+--------------+ POP      Full           Yes      Yes                                  +---------+---------------+---------+-----------+----------+--------------+ PTV      Full                                                        +---------+---------------+---------+-----------+----------+--------------+ PERO     Full                                                        +---------+---------------+---------+-----------+----------+--------------+ Gastroc  Full                                                        +---------+---------------+---------+-----------+----------+--------------+   +---------+---------------+---------+-----------+----------+--------------+ LEFT     CompressibilityPhasicitySpontaneityPropertiesThrombus Aging +---------+---------------+---------+-----------+----------+--------------+ CFV      Full           Yes      Yes                                 +---------+---------------+---------+-----------+----------+--------------+ SFJ      Full                                                        +---------+---------------+---------+-----------+----------+--------------+  FV Prox  Full                                                        +---------+---------------+---------+-----------+----------+--------------+ FV Mid   Full                                                        +---------+---------------+---------+-----------+----------+--------------+ FV DistalFull                                                        +---------+---------------+---------+-----------+----------+--------------+ PFV      Full                                                        +---------+---------------+---------+-----------+----------+--------------+ POP      None           No       No                   Acute          +---------+---------------+---------+-----------+----------+--------------+ PTV      None                                         Acute           +---------+---------------+---------+-----------+----------+--------------+ PERO     None                                         Acute          +---------+---------------+---------+-----------+----------+--------------+ Gastroc  Full                                                        +---------+---------------+---------+-----------+----------+--------------+     Summary: RIGHT: - There is no evidence of deep vein thrombosis in the lower extremity.  - No cystic structure found in the popliteal fossa.  LEFT: - Findings consistent with acute deep vein thrombosis involving the left popliteal vein, left posterior tibial veins, and left peroneal veins. - No cystic structure found in the popliteal fossa.  *See table(s) above for measurements and observations. Electronically signed by Orlie Pollen on 07/05/2021 at 3:15:00 PM.    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  At this point, the patient would potentially benefit from radiotherapy. The options include whole brain irradiation versus stereotactic radiosurgery. There  are pros and cons associated with each of these potential treatment options. Whole brain radiotherapy would treat the known metastatic deposits and help provide some reduction of risk for future brain metastases. However, whole brain radiotherapy carries potential risks including hair loss, subacute somnolence, and neurocognitive changes including a possible reduction in short-term memory. Whole brain radiotherapy also may carry a lower likelihood of tumor control at the treatment sites because of the low-dose used. Stereotactic radiosurgery carries a higher likelihood for local tumor control at the targeted sites with lower associated risk for neurocognitive changes such as memory loss. However, the use of stereotactic radiosurgery in this setting may leave the patient at increased  risk for new brain metastases elsewhere in the brain as high as 50-60%. Accordingly, patients who receive stereotactic radiosurgery in this setting should undergo ongoing surveillance imaging with brain MRI more frequently in order to identify and treat new small brain metastases before they become symptomatic. Stereotactic radiosurgery does carry some different risks, including a risk of radionecrosis.  PLAN: Today, we reviewed the findings and workup thus far with the patient. We discussed the dilemma regarding whole brain radiotherapy versus stereotactic radiosurgery. We discussed the pros and cons of each. We also discussed the logistics and delivery of each. We reviewed the results associated with each of the treatments described above. The patient and her family members seem to understand the treatment options and would like to proceed with the recommended single fraction stereotactic radiosurgery.  She has freely signed written consent to proceed today in the office and a copy of this document will be placed in her medical record.  We will proceed with CT simulation/treatment planning following our visit today in anticipation of a single fraction of stereotactic radiosurgery [facility]), tentatively scheduled for 07/26/2021.  She will continue taking the Decadron as prescribed and we reviewed and provided written instruction regarding the appropriate dosages.  Beginning tomorrow, she will start taking 4 mg p.o. twice daily through 07/23/2021 and then beginning 07/24/2021, she will begin taking 4 mg p.o. daily with plans to wean off this medication following her SRS treatment.  She and her family know that they are welcome to call at anytime in interim with any questions or concerns related to the radiation.  We personally spent 60 minutes in this encounter including chart review, reviewing radiological studies, meeting face-to-face with the patient, entering orders and completing documentation.      Nicholos Johns, PA-C    Tyler Pita, MD  [facility] Direct Dial: [phone]  Fax: [phone] [email or web address]  Skype  LinkedIn

## 2021-07-19 NOTE — Progress Notes (Signed)
Patient reports mild fatigue, and some occasional visual focus issues. No other symptoms reported at this time.  Meaningful use complete.  BP 131/67 (BP Location: Left Arm, Patient Position: Sitting, Cuff Size: Large)   Pulse 78   Temp 97.9 F (36.6 C)   Resp 20   Ht 5' 6.75" (1.695 m)   SpO2 97%   BMI 32.18 kg/m

## 2021-07-20 ENCOUNTER — Encounter: Payer: Self-pay | Admitting: General Practice

## 2021-07-20 NOTE — Progress Notes (Signed)
[place] Psychosocial Distress Screening Clinical Social Work  Clinical Social Work was referred by distress screening protocol.  The patient scored a 8 on the Psychosocial Distress Thermometer which indicates moderate distress. Clinical Social Worker contacted patient by phone to assess for distress and other psychosocial needs  She feels well supported by her husband who lives in the home.  She is getting over a bout of pneumonia, and had a recent stay at [facility] for rehab.  Described support services available both here at [facility] and at [facility] - will mail her information on both.   She is encouraged to contact us as needed for support/resources.   ONCBCN DISTRESS SCREENING 07/19/2021  Distress experienced in past week (1-10) 8  Emotional problem type Nervousness/Anxiety;Adjusting to illness    Clinical Social Worker follow up needed: No.  If yes, follow up plan:  Beverely Pace, [place], LCSW Clinical Social Worker Phone:  [phone]

## 2021-07-20 NOTE — Progress Notes (Signed)
  Radiation Oncology         [phone]) [phone] ________________________________  Name: Tara Murphy MRN: [id number]  Date: 07/19/2021  DOB: 06-Feb-1946  SIMULATION AND TREATMENT PLANNING NOTE    ICD-10-CM   1. Brain metastasis (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  NARRATIVE:  The patient was brought to the [facility].  Identity was confirmed.  All relevant records and images related to the planned course of therapy were reviewed.  The patient freely provided informed written consent to proceed with treatment after reviewing the details related to the planned course of therapy. The consent form was witnessed and verified by the simulation staff. Intravenous access was established for contrast administration. Then, the patient was set-up in a stable reproducible supine position for radiation therapy.  A relocatable thermoplastic stereotactic head frame was fabricated for precise immobilization.  CT images were obtained.  Surface markings were placed.  The CT images were loaded into the planning software and fused with the patient's targeting MRI scan.  Then the target and avoidance structures were contoured.  Treatment planning then occurred.  The radiation prescription was entered and confirmed.  I have requested 3D planning  I have requested a DVH of the following structures: Brain stem, brain, left eye, right eye, lenses, optic chiasm, target volumes, uninvolved brain, and normal tissue.    SPECIAL TREATMENT PROCEDURE:  The planned course of therapy using radiation constitutes a special treatment procedure. Special care is required in the management of this patient for the following reasons. This treatment constitutes a Special Treatment Procedure for the following reason: High dose per fraction requiring special monitoring for increased  toxicities of treatment including daily imaging.  The special nature of the planned course of radiotherapy will require increased physician supervision and oversight to ensure patient's safety with optimal treatment outcomes.  This requires extended time and effort.  PLAN:  The patient will receive 20 Gy in 1 fraction.  ________________________________  Sheral Apley Tammi Klippel, M.D.

## 2021-07-22 ENCOUNTER — Other Ambulatory Visit: Payer: Self-pay | Admitting: Nurse Practitioner

## 2021-07-22 DIAGNOSIS — D8989 Other specified disorders involving the immune mechanism, not elsewhere classified: Secondary | ICD-10-CM

## 2021-07-22 DIAGNOSIS — Z1329 Encounter for screening for other suspected endocrine disorder: Secondary | ICD-10-CM

## 2021-07-22 DIAGNOSIS — C3492 Malignant neoplasm of unspecified part of left bronchus or lung: Secondary | ICD-10-CM

## 2021-07-22 NOTE — Progress Notes (Signed)
[place]   [phone] [phone]   Clinic Follow up Note   Patient Care Team: Leamon Arnt, MD as PCP - General (Family Medicine) Loletha Carrow, Kirke Corin, MD as Consulting Physician (Gastroenterology) Ulla Gallo, MD as Consulting Physician (Dermatology) Wylene Simmer, MD as Consulting Physician (Orthopedic Surgery) Alda Berthold, DO as Consulting Physician (Neurology) Jimmy Footman, NP as Nurse Practitioner 07/23/2021  CHIEF COMPLAINT: Follow up metastatic lung cancer with brain metastasis  SUMMARY OF ONCOLOGIC HISTORY: Oncology History  Breast cancer of lower-outer quadrant of left female breast ([place]) (Resolved)  10/15/2013 Surgery   Left breast lumpectomy: Well-differentiated IDC grade 1, 1.1 cm with intermediate grade DCIS solid and cribriform, margins negative, no LVID, 0/2 sentinel nodes negative, T1 cN0 M0 stage IA, Oncotype DX score 8, (6% ROR) ER/PR positive HER-2 negative   11/15/2013 - 11/16/2013 Radiation Therapy   MammoSite partial breast radiation   12/17/2013 - 08/29/2015 Anti-estrogen oral therapy   Anastrozole 1 mg daily (severe fatigue, emotional changes, skin dryness), switched to tamoxifen 06/20/2015 (stopped due to depression)   Primary cancer of left upper lobe of lung ([place])  05/03/2021 Initial Diagnosis   Primary cancer of left upper lobe of lung ([place]ara [place])   07/24/2021 -  Chemotherapy   Patient is on Treatment Plan : LUNG NSCLC flat dose Pembrolizumab Q21D       CURRENT THERAPY:  PENDING SRS to new left parietal brain met and starting immunotherapy with pembrolizumab 07/24/21   INTERVAL HISTORY: Ms. Tara Murphy returns for follow up as scheduled. She has been seen recently by Neuro Onc Dr. Mickeal Skinner and RT Dr. Tammi Klippel. Radiation to the new left brain met is scheduled for this Thursday.  She continues Decadron 4 mg twice daily which is improving her strength and mobility.  She continues to have profound weakness in  the left leg but able to ambulate at home with a walker independently.  She can shower and dress. She has had more abdominal bloating and firmness recently.  She notes after her lung surgery she did swell in the abdomen.  When she stands she has a bandlike sensation, and bloating does restrict her breathing while she is ambulatory.  Appetite is decreased, bowels moving slowly without a complete BM in several days.  She noted a "black tarry stool" recently but no obvious blood.  She does strain some. She is eating but not drinking a lot, occasionally dizzy on standing. Not on BP meds. Denies n/v or any pain. She has a productive cough in the morning that dissipates, no recent fever, chills, chest pain, dyspnea. She has a f/up chest xray later today.   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[place])    S/p total hip replacement left   Breast cancer ([place]) 2015   left    Cataract    Cholecystolithiasis    Chronic allergic rhinitis    Depression    Former smoker, stopped smoking in distant past 10/05/2019   Quit 2000; 29 y smoking history   GERD (gastroesophageal reflux disease)    Hepatic steatosis 06/08/2020   Intermittent elevated LFTs and ultrasound 2018   Hyperlipemia    IBS (irritable bowel syndrome) 10/08/2018   Nl colonoscopy and EGD 09/2018   IBS (irritable bowel syndrome)    Lichen plano-pilaris    Lung cancer ([place])    Major depression, chronic 09/05/2015  Neuropathy, peripheral, idiopathic    uses gabapentin   Osteopenia 09/07/2019   Dexa 08/2019: T = -1.2 at wrist; back and hips excluded (DJD and hardware).    Personal history of radiation therapy 2015   Seizures ([place])    Urinary tract infection    hx of frequent     SURGICAL HISTORY: Past Surgical History:  Procedure Laterality Date   APPENDECTOMY     APPLICATION OF CRANIAL NAVIGATION Right 04/30/2021   Procedure: APPLICATION OF CRANIAL  NAVIGATION;  Surgeon: Earnie Larsson, MD;  Location: [place];  Service: Neurosurgery;  Laterality: Right;   BREAST LUMPECTOMY Left 2015   CATARACT EXTRACTION Bilateral    cataracts     CHOLECYSTECTOMY N/A 10/29/2016   Procedure: LAPAROSCOPIC CHOLECYSTECTOMY;  Surgeon: Erroll Luna, MD;  Location: [place];  Service: General;  Laterality: N/A;   COLONOSCOPY     CRANIOTOMY Right 04/30/2021   Procedure: Right Parietal Craniotomy for tumor with Brain Lab;  Surgeon: Earnie Larsson, MD;  Location: [place];  Service: Neurosurgery;  Laterality: Right;   EYE SURGERY     INTERCOSTAL NERVE BLOCK Left 06/13/2021   Procedure: INTERCOSTAL NERVE BLOCK;  Surgeon: Melrose Nakayama, MD;  Location: [place];  Service: Thoracic;  Laterality: Left;   LIPOSUCTION     LYMPH NODE DISSECTION Left 06/13/2021   Procedure: LYMPH NODE DISSECTION;  Surgeon: Melrose Nakayama, MD;  Location: [place];  Service: Thoracic;  Laterality: Left;   TOTAL HIP ARTHROPLASTY     bilateral hip arthoplasty   TOTAL HIP ARTHROPLASTY Right 12/18/2015   Procedure: RIGHT TOTAL HIP ARTHROPLASTY ANTERIOR APPROACH;  Surgeon: Gaynelle Arabian, MD;  Location: [facility];  Service: Orthopedics;  Laterality: Right;   TOTAL KNEE ARTHROPLASTY     TOTAL KNEE ARTHROPLASTY Left 11/25/2016   Procedure: LEFT TOTAL KNEE ARTHROPLASTY;  Surgeon: Gaynelle Arabian, MD;  Location: [facility];  Service: Orthopedics;  Laterality: Left;  with abductor block   TUBAL LIGATION     UPPER GASTROINTESTINAL ENDOSCOPY      I have reviewed the social history and family history with the patient and they are unchanged from previous note.  ALLERGIES:  is allergic to fluorescein, contrast media [iodinated diagnostic agents], hydromorphone hcl, other, sulfamethoxazole-trimethoprim, fentanyl, tape, and terbinafine and related.  MEDICATIONS:  Current Outpatient Medications  Medication Sig Dispense Refill   acetaminophen (TYLENOL) 325 MG tablet Take 1-2 tablets (325-650 mg total) by mouth every  4 (four) hours as needed for mild pain.     APIXABAN (ELIQUIS) VTE STARTER PACK (10MG AND 5MG) Take as directed on package: start with two-64m tablets twice daily for 7 days. On day 8, switch to one-548mtablet twice daily. 74 each 0   busPIRone (BUSPAR) 7.5 MG tablet Take 1 tablet (7.5 mg total) by mouth 2 (two) times daily as needed (anxiety). 30 tablet 0   clobetasol (TEMOVATE) 0.05 % external solution Apply 1 application topically daily as needed (irritation).     dexamethasone (DECADRON) 4 MG tablet Take 1 tablet (4 mg total) by mouth 2 (two) times daily with a meal. 60 tablet 1   fluticasone (FLONASE) 50 MCG/ACT nasal spray Place 1 spray into both nostrils daily as needed for allergies or rhinitis.     gabapentin (NEURONTIN) 300 MG capsule Take 2 capsules (600 mg total) by mouth at bedtime.     levETIRAcetam (KEPPRA) 500 MG tablet Take 500 mg by mouth 2 (two) times daily.     melatonin 3 MG TABS tablet Take 1  tablet (3 mg total) by mouth at bedtime. (Patient not taking: Reported on 07/23/2021) 30 tablet 0   Omeprazole 20 MG TBDD Take 20 mg by mouth daily as needed (indigestion). OTC     rOPINIRole (REQUIP) 4 MG tablet Take 1 tablet (4 mg total) by mouth at bedtime. 30 tablet 0   No current facility-administered medications for this visit.    PHYSICAL EXAMINATION: ECOG PERFORMANCE STATUS: 1-2    Vitals:   07/23/21 0934  BP: 128/70  Pulse: 85  Resp: 18  Temp: 97.9 F (36.6 C)  SpO2: 99%   Filed Weights    GENERAL:alert, no distress and comfortable SKIN: No rash EYES: sclera clear NECK: Without mass or swelling LUNGS: clear with normal breathing effort, decreased in the right lung HEART: regular rate & rhythm, mild left greater than right lower extremity edema ABDOMEN: distended firm upper abdomen without palpable mass, no tenderness.  Decreased bowel sounds in the right lower quadrant NEURO: alert & oriented x 3 with fluent speech, left leg weakness  LABORATORY DATA:  I  have reviewed the data as listed CBC Latest Ref Rng & Units 07/23/2021 07/05/2021 07/03/2021  WBC 4.0 - 10.5 K/uL 31.1(H) 6.7 10.1  Hemoglobin 12.0 - 15.0 g/dL 12.2 8.9(L) 10.5(L)  Hematocrit 36.0 - 46.0 % 37.1 27.9(L) 31.5(L)  Platelets 150 - 400 K/uL 520(H) 274 296     CMP Latest Ref Rng & Units 07/23/2021 07/06/2021 07/05/2021  Glucose 70 - 99 mg/dL 240(H) 111(H) 111(H)  BUN 8 - 23 mg/dL 19 9 16   Creatinine 0.44 - 1.00 mg/dL 0.69 0.40(L) 0.64  Sodium 135 - 145 mmol/L 136 138 138  Potassium 3.5 - 5.1 mmol/L 4.0 3.9 3.8  Chloride 98 - 111 mmol/L 103 108 107  CO2 22 - 32 mmol/L 21(L) 22 23  Calcium 8.9 - 10.3 mg/dL 8.7(L) 8.1(L) 8.0(L)  Total Protein 6.5 - 8.1 g/dL 6.8 - -  Total Bilirubin 0.3 - 1.2 mg/dL 0.4 - -  Alkaline Phos 38 - 126 U/L 108 - -  AST 15 - 41 U/L 20 - -  ALT 0 - 44 U/L 40 - -      RADIOGRAPHIC STUDIES: I have personally reviewed the radiological images as listed and agreed with the findings in the report. DG Chest 2 View  Result Date: 07/23/2021 CLINICAL DATA:  Small cell left lung cancer, left abdominal pain and pleuritic left chest pain EXAM: CHEST - 2 VIEW COMPARISON:  07/03/2021 chest radiograph. FINDINGS: Surgical clips are seen in the right upper quadrant of the abdomen. stable cardiomediastinal silhouette with normal heart size. No pneumothorax. Loculated small to moderate left pleural effusion, predominantly posterior and basilar, unchanged. No right pleural effusion. No pulmonary edema. No acute consolidative airspace disease. IMPRESSION: Stable loculated small to moderate left pleural effusion. Otherwise no active cardiopulmonary disease. Electronically Signed   By: Ilona Sorrel M.D.   On: 07/23/2021 12:15   DG Abd 2 Views  Result Date: 07/23/2021 CLINICAL DATA:  Metastatic lung cancer, bloating, constipation EXAM: ABDOMEN - 2 VIEW COMPARISON:  None. FINDINGS: Surgical clips are seen in the right upper quadrant of the abdomen. No dilated small bowel  loops or air-fluid levels. Large colonic stool volume, most prominent in the right colon. No evidence of pneumatosis or pneumoperitoneum. Partially visualized bilateral total hip arthroplasty. No radiopaque nephrolithiasis. IMPRESSION: Nonobstructive bowel gas pattern. Large colonic stool volume, most prominent in the right colon, suggesting constipation. Electronically Signed   By: Janina Mayo.D.  On: 07/23/2021 12:11     ASSESSMENT & PLAN: Paytience Tara Murphy is a 75 y.o. female with    1. 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 Experienced a seizure on 04/24/21 and was found to have a brain metastasis. Staging CT CAP revealed a 4.3 cm LUL suprahilar mass, 8 mm RUL nodule, no acute process or evidence of subdiaphragmatic metastases. -she had craniotomy on 04/30/21 with Dr. Annette Stable, and pathology was consistent with metastatic lung carcinoma. She subsequently received 3 doses of SRS under Dr. Tammi Klippel. -PET 06/01/21 showed primary LUL lesion, no enlarged or hypermetabolic lymph nodes or other metastatic disease. -she then underwent lobectomy on 06/13/21 with Dr. Roxan Hockey. Pathology showed non-small cell lung cancer, 3.2 cm, margins and all 9 lymph nodes were negative. -restaging brain MRI 07/10/21 showed new 8 mm left parietal metastasis and enlargement of right frontal lesion. Pending SRS to left parietal lesion by Dr. Tammi Klippel 07/26/21. -Her tumor showed high expression of PD-L1 (TPS 90%), which predicts good response to immunotherapy. Dr. Burr Medico recommended Beryle Flock every 3 weeks for up to 2 years -Her foundation 1 showed K-ras mutation, no other targetable mutation.  Tumor mutation burden was high (20 mut/MB) which again predicts good response to immunotherapy. -beginning pembrolizumab 07/24/21   2. PE, Pneumonia -presented with SOB on 07/03/21 -she has completed a course of antibiotics -started on  Eliquis and is now on oxygen as needed. -repeat CXR today shows stable loculated small to moderate left pleural effusion, no infection  3. Left leg weakness, DVT -present since before initial presentation in 04/2021. -DVT diagnosed on 07/05/21 (not present on doppler 06/05/21)    4.Seizure,secondary to brain metastasis  -on dex taper and keppra prophylaxis  -followed by Dr. Mickeal Skinner    Disposition: Ms. Ginette Otto appears stable.  Left leg weakness has improved on steroids.  Begin taper to 2 mg twice daily due to pending immunotherapy.  Labs reviewed.  Leukocytosis and hyperglycemia likely secondary to steroids.  Hgb 12.2, previously 8.92 weeks ago.  I reviewed this could be falsely normal due to dehydration.  I encouraged her to drink more.  Platelets 520.  She has increased abdominal bloating, constipation, and decreased bowel sounds. Stat abdominal x-ray was negative for obstruction. It did show large stool burden c/w constipation. I recommend miralax BID until having consistent BM, then once daily. If stools become loose/frequent then reduce to PRN.   Repeat CXR shows stable loculated pleural effusion, likely contributing to exertional dyspnea. No hypoxia, will monitor.   She will proceed with pembrolizumab q3 weeks starting 07/24/21. We reviewed the potential side effects, symptom management, and goal of immunotherapy. She will follow-up with palliative care today, neuro oncology 11/1, thoracic surgery 11/1, and is scheduled for SRS to left brain met on 11/3 per Dr. Tammi Klippel.  Follow-up and cycle 2 pembrolizumab in 3 weeks, or sooner if needed.  Orders Placed This Encounter  Procedures   US Abdomen Complete    Standing Status:   Future    Standing Expiration Date:   07/23/2022    Order Specific Question:   Reason for Exam (SYMPTOM  OR DIAGNOSIS REQUIRED)    Answer:   metastatic lung cancer, abd bloating. r/o ascites    Order Specific Question:   Preferred imaging location?    Answer:    [facility]   DG Abd 2 Views    Standing Status:   Future  Number of Occurrences:   1    Standing Expiration Date:   07/23/2022    Order Specific Question:   Reason for Exam (SYMPTOM  OR DIAGNOSIS REQUIRED)    Answer:   metastatic lung cancer, bloating, constipation    Order Specific Question:   Preferred imaging location?    Answer:   [facility]   All questions were answered. The patient knows to call the clinic with any problems, questions or concerns. No barriers to learning were detected. Total encounter time was 30 minutes.      Alla Feeling, NP 07/23/21

## 2021-07-23 ENCOUNTER — Telehealth: Payer: Self-pay | Admitting: Nurse Practitioner

## 2021-07-23 ENCOUNTER — Other Ambulatory Visit: Payer: Self-pay

## 2021-07-23 ENCOUNTER — Inpatient Hospital Stay (HOSPITAL_BASED_OUTPATIENT_CLINIC_OR_DEPARTMENT_OTHER): Payer: Medicare Other | Admitting: Nurse Practitioner

## 2021-07-23 ENCOUNTER — Inpatient Hospital Stay: Payer: Medicare Other

## 2021-07-23 ENCOUNTER — Ambulatory Visit: Payer: Medicare Other | Admitting: Family Medicine

## 2021-07-23 ENCOUNTER — Ambulatory Visit (HOSPITAL_COMMUNITY)
Admission: RE | Admit: 2021-07-23 | Discharge: 2021-07-23 | Disposition: A | Discharge status: Home / Self Care | Payer: Medicare Other | Source: Ambulatory Visit | Attending: Urology | Admitting: Urology

## 2021-07-23 ENCOUNTER — Encounter: Payer: Self-pay | Admitting: Family Medicine

## 2021-07-23 ENCOUNTER — Ambulatory Visit (HOSPITAL_COMMUNITY)
Admission: RE | Admit: 2021-07-23 | Discharge: 2021-07-23 | Disposition: A | Discharge status: Home / Self Care | Payer: Medicare Other | Source: Ambulatory Visit | Attending: Nurse Practitioner | Admitting: Nurse Practitioner

## 2021-07-23 ENCOUNTER — Encounter: Payer: Self-pay | Admitting: Nurse Practitioner

## 2021-07-23 VITALS — BP 128/70 | HR 85 | Temp 97.9°F | Resp 18 | Ht 66.75 in

## 2021-07-23 DIAGNOSIS — R079 Chest pain, unspecified: Secondary | ICD-10-CM | POA: Diagnosis not present

## 2021-07-23 DIAGNOSIS — Z515 Encounter for palliative care: Secondary | ICD-10-CM

## 2021-07-23 DIAGNOSIS — R109 Unspecified abdominal pain: Secondary | ICD-10-CM | POA: Diagnosis not present

## 2021-07-23 DIAGNOSIS — R531 Weakness: Secondary | ICD-10-CM | POA: Diagnosis not present

## 2021-07-23 DIAGNOSIS — J90 Pleural effusion, not elsewhere classified: Secondary | ICD-10-CM | POA: Diagnosis not present

## 2021-07-23 DIAGNOSIS — C7931 Secondary malignant neoplasm of brain: Secondary | ICD-10-CM | POA: Diagnosis not present

## 2021-07-23 DIAGNOSIS — R569 Unspecified convulsions: Secondary | ICD-10-CM | POA: Diagnosis not present

## 2021-07-23 DIAGNOSIS — K5900 Constipation, unspecified: Secondary | ICD-10-CM | POA: Diagnosis not present

## 2021-07-23 DIAGNOSIS — Z1329 Encounter for screening for other suspected endocrine disorder: Secondary | ICD-10-CM

## 2021-07-23 DIAGNOSIS — C3412 Malignant neoplasm of upper lobe, left bronchus or lung: Secondary | ICD-10-CM | POA: Insufficient documentation

## 2021-07-23 DIAGNOSIS — J189 Pneumonia, unspecified organism: Secondary | ICD-10-CM | POA: Diagnosis not present

## 2021-07-23 DIAGNOSIS — D8989 Other specified disorders involving the immune mechanism, not elsewhere classified: Secondary | ICD-10-CM

## 2021-07-23 DIAGNOSIS — C3492 Malignant neoplasm of unspecified part of left bronchus or lung: Secondary | ICD-10-CM

## 2021-07-23 DIAGNOSIS — C3490 Malignant neoplasm of unspecified part of unspecified bronchus or lung: Secondary | ICD-10-CM | POA: Diagnosis not present

## 2021-07-23 DIAGNOSIS — R140 Abdominal distension (gaseous): Secondary | ICD-10-CM

## 2021-07-23 DIAGNOSIS — M879 Osteonecrosis, unspecified: Secondary | ICD-10-CM | POA: Diagnosis not present

## 2021-07-23 LAB — CBC WITH DIFFERENTIAL (CANCER CENTER ONLY)
Abs Immature Granulocytes: 1.23 10*3/uL — ABNORMAL HIGH (ref 0.00–0.07)
Basophils Absolute: 0 10*3/uL (ref 0.0–0.1)
Basophils Relative: 0 %
Eosinophils Absolute: 0 10*3/uL (ref 0.0–0.5)
Eosinophils Relative: 0 %
HCT: 37.1 % (ref 36.0–46.0)
Hemoglobin: 12.2 g/dL (ref 12.0–15.0)
Immature Granulocytes: 4 %
Lymphocytes Relative: 6 %
Lymphs Abs: 1.9 10*3/uL (ref 0.7–4.0)
MCH: 29.7 pg (ref 26.0–34.0)
MCHC: 32.9 g/dL (ref 30.0–36.0)
MCV: 90.3 fL (ref 80.0–100.0)
Monocytes Absolute: 1.3 10*3/uL — ABNORMAL HIGH (ref 0.1–1.0)
Monocytes Relative: 4 %
Neutro Abs: 26.7 10*3/uL — ABNORMAL HIGH (ref 1.7–7.7)
Neutrophils Relative %: 86 %
Platelet Count: 520 10*3/uL — ABNORMAL HIGH (ref 150–400)
RBC: 4.11 MIL/uL (ref 3.87–5.11)
RDW: 15.2 % (ref 11.5–15.5)
Smear Review: NORMAL
WBC Count: 31.1 10*3/uL — ABNORMAL HIGH (ref 4.0–10.5)
nRBC: 0.1 % (ref 0.0–0.2)

## 2021-07-23 LAB — CMP (CANCER CENTER ONLY)
ALT: 40 U/L (ref 0–44)
AST: 20 U/L (ref 15–41)
Albumin: 3.6 g/dL (ref 3.5–5.0)
Alkaline Phosphatase: 108 U/L (ref 38–126)
Anion gap: 12 (ref 5–15)
BUN: 19 mg/dL (ref 8–23)
CO2: 21 mmol/L — ABNORMAL LOW (ref 22–32)
Calcium: 8.7 mg/dL — ABNORMAL LOW (ref 8.9–10.3)
Chloride: 103 mmol/L (ref 98–111)
Creatinine: 0.69 mg/dL (ref 0.44–1.00)
GFR, Estimated: >60 mL/min
Glucose, Bld: 240 mg/dL — ABNORMAL HIGH (ref 70–99)
Potassium: 4 mmol/L (ref 3.5–5.1)
Sodium: 136 mmol/L (ref 135–145)
Total Bilirubin: 0.4 mg/dL (ref 0.3–1.2)
Total Protein: 6.8 g/dL (ref 6.5–8.1)

## 2021-07-23 LAB — TSH: TSH: 0.235 u[IU]/mL — ABNORMAL LOW (ref 0.308–3.960)

## 2021-07-23 NOTE — Progress Notes (Signed)
[place]  [phone] Fax:([phone]) [phone]   Name: Panhia Karl Date: 07/23/2021 MRN: [id number]  DOB: [birth year]  Patient Care Team: Leamon Arnt, MD as PCP - General (Family Medicine) Loletha Carrow, Kirke Corin, MD as Consulting Physician (Gastroenterology) Ulla Gallo, MD as Consulting Physician (Dermatology) Wylene Simmer, MD as Consulting Physician (Orthopedic Surgery) Alda Berthold, DO as Consulting Physician (Neurology) Pickenpack-Cousar, Carlena Sax, NP as Nurse Practitioner    REASON FOR CONSULTATION: Tara Murphy is a 76 y.o. 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  Patient recently hospitalized and treated for pneumonia and PE.  Subsequently she was discharged home on Eliquis and oxygen.  Restaging brain MRI on 07/10/2021 showing new 8 mm left parietal metastasis and enlargement of right frontal lesion.  Patient being seen today as post hospital follow-up follow-up for ongoing goals of care and symptom management.  I personally saw patient during recent hospitalization for palliative needs.     SOCIAL HISTORY:     reports that she quit smoking about 29 years ago. Her smoking use included cigarettes. She smoked an average of 1 pack per day. She has never used smokeless tobacco. She reports current alcohol use of about 14.0 standard drinks per week. She reports that she does not use drugs.  ADVANCE DIRECTIVES:  Patient has documented advanced directives on file via [employer]. Documents personally reviewed. Patients daughter, Tara Murphy is [place]  CODE STATUS: Full code  PAST MEDICAL HISTORY: Past Medical History:  Diagnosis Date   Allergy     Arthritis    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[place])    S/p total hip replacement left   Breast cancer ([place]) 2015   left    Cataract    Cholecystolithiasis    Chronic allergic rhinitis    Depression    Former smoker, stopped smoking in distant past 10/05/2019   Quit 2000; 40 y smoking history   GERD (gastroesophageal reflux disease)    Hepatic steatosis 06/08/2020   Intermittent elevated LFTs and ultrasound 2018   Hyperlipemia    IBS (irritable bowel syndrome) 10/08/2018   Nl colonoscopy and EGD 09/2018   IBS (irritable bowel syndrome)    Lichen plano-pilaris    Lung cancer ([place])    Major depression, 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    Personal history of radiation therapy 2015   Seizures ([place])    Urinary tract infection    hx of frequent     PAST SURGICAL HISTORY:  Past Surgical History:  Procedure Laterality Date   APPENDECTOMY     APPLICATION OF CRANIAL NAVIGATION Right 04/30/2021   Procedure: APPLICATION OF CRANIAL NAVIGATION;  Surgeon: Earnie Larsson, MD;  Location: [facility];  Service: Neurosurgery;  Laterality: Right;   BREAST LUMPECTOMY Left 2015   CATARACT EXTRACTION Bilateral    cataracts     CHOLECYSTECTOMY N/A 10/29/2016   Procedure: LAPAROSCOPIC CHOLECYSTECTOMY;  Surgeon: Erroll Luna, MD;  Location: [facility];  Service: General;  Laterality: N/A;   COLONOSCOPY     CRANIOTOMY Right 04/30/2021   Procedure: Right Parietal Craniotomy for tumor  with Brain Lab;  Surgeon: Earnie Larsson, MD;  Location: [facility];  Service: Neurosurgery;  Laterality: Right;   EYE SURGERY     INTERCOSTAL NERVE BLOCK Left 06/13/2021   Procedure: INTERCOSTAL NERVE BLOCK;  Surgeon: Melrose Nakayama, MD;  Location: [facility];  Service: Thoracic;  Laterality: Left;   LIPOSUCTION     LYMPH NODE DISSECTION Left 06/13/2021   Procedure: LYMPH NODE DISSECTION;   Surgeon: Melrose Nakayama, MD;  Location: [facility];  Service: Thoracic;  Laterality: Left;   TOTAL HIP ARTHROPLASTY     bilateral hip arthoplasty   TOTAL HIP ARTHROPLASTY Right 12/18/2015   Procedure: RIGHT TOTAL HIP ARTHROPLASTY ANTERIOR APPROACH;  Surgeon: Gaynelle Arabian, MD;  Location: [facility];  Service: Orthopedics;  Laterality: Right;   TOTAL KNEE ARTHROPLASTY     TOTAL KNEE ARTHROPLASTY Left 11/25/2016   Procedure: LEFT TOTAL KNEE ARTHROPLASTY;  Surgeon: Gaynelle Arabian, MD;  Location: [facility];  Service: Orthopedics;  Laterality: Left;  with abductor block   TUBAL LIGATION     UPPER GASTROINTESTINAL ENDOSCOPY      HEMATOLOGY/ONCOLOGY HISTORY:  Oncology History  Breast cancer of lower-outer quadrant of left female breast ([place]) (Resolved)  10/15/2013 Surgery   Left breast lumpectomy: Well-differentiated IDC grade 1, 1.1 cm with intermediate grade DCIS solid and cribriform, margins negative, no LVID, 0/2 sentinel nodes negative, T1 cN0 M0 stage IA, Oncotype DX score 8, (6% ROR) ER/PR positive HER-2 negative   11/15/2013 - 11/16/2013 Radiation Therapy   MammoSite partial breast radiation   12/17/2013 - 08/29/2015 Anti-estrogen oral therapy   Anastrozole 1 mg daily (severe fatigue, emotional changes, skin dryness), switched to tamoxifen 06/20/2015 (stopped due to depression)   Primary cancer of left upper lobe of lung ([place])  05/03/2021 Initial Diagnosis   Primary cancer of left upper lobe of lung ([place])   07/25/2021 -  Chemotherapy   Patient is on Treatment Plan : LUNG NSCLC flat dose Pembrolizumab Q21D       ALLERGIES:  is allergic to fluorescein, contrast media [iodinated diagnostic agents], hydromorphone hcl, other, sulfamethoxazole-trimethoprim, fentanyl, tape, and terbinafine and related.  MEDICATIONS:  Current Outpatient Medications  Medication Sig Dispense Refill   acetaminophen (TYLENOL) 325 MG tablet Take 1-2 tablets (325-650 mg total) by mouth every 4 (four) hours as needed for  mild pain.     APIXABAN (ELIQUIS) VTE STARTER PACK (10MG AND 5MG) Take as directed on package: start with two-58m tablets twice daily for 7 days. On day 8, switch to one-571mtablet twice daily. 74 each 0   busPIRone (BUSPAR) 7.5 MG tablet Take 1 tablet (7.5 mg total) by mouth 2 (two) times daily as needed (anxiety). 30 tablet 0   clobetasol (TEMOVATE) 0.05 % external solution Apply 1 application topically daily as needed (irritation).     dexamethasone (DECADRON) 4 MG tablet Take 1 tablet (4 mg total) by mouth 2 (two) times daily with a meal. 60 tablet 1   fluticasone (FLONASE) 50 MCG/ACT nasal spray Place 1 spray into both nostrils daily as needed for allergies or rhinitis.     gabapentin (NEURONTIN) 300 MG capsule Take 2 capsules (600 mg total) by mouth at bedtime.     levETIRAcetam (KEPPRA) 500 MG tablet Take 500 mg by mouth 2 (two) times daily.     melatonin 3 MG TABS tablet Take 1 tablet (3 mg total) by mouth at bedtime. 30 tablet 0   Omeprazole 20 MG TBDD Take 20 mg by mouth daily as needed (indigestion). OTC  rOPINIRole (REQUIP) 4 MG tablet Take 1 tablet (4 mg total) by mouth at bedtime. 30 tablet 0   No current facility-administered medications for this visit.    VITAL SIGNS: There were no vitals taken for this visit. There were no vitals filed for this visit.  Estimated body mass index is 32.18 kg/m as calculated from the following:   Height as of an earlier encounter on 07/23/21: 5' 6.75" (1.695 m).   Weight as of 07/03/21: 203 lb 14.8 oz (92.5 kg).  LABS: CBC:    Component Value Date/Time   WBC 31.1 (H) 07/23/2021 0907   WBC 6.7 07/05/2021 0216   HGB 12.2 07/23/2021 0907   HCT 37.1 07/23/2021 0907   PLT 520 (H) 07/23/2021 0907   MCV 90.3 07/23/2021 0907   NEUTROABS 26.7 (H) 07/23/2021 0907   LYMPHSABS 1.9 07/23/2021 0907   MONOABS 1.3 (H) 07/23/2021 0907   EOSABS 0.0 07/23/2021 0907   BASOSABS 0.0 07/23/2021 0907   Comprehensive Metabolic Panel:    Component  Value Date/Time   NA 136 07/23/2021 0907   K 4.0 07/23/2021 0907   CL 103 07/23/2021 0907   CO2 21 (L) 07/23/2021 0907   BUN 19 07/23/2021 0907   CREATININE 0.69 07/23/2021 0907   CREATININE 0.69 06/07/2020 1410   GLUCOSE 240 (H) 07/23/2021 0907   CALCIUM 8.7 (L) 07/23/2021 0907   AST 20 07/23/2021 0907   ALT 40 07/23/2021 0907   ALKPHOS 108 07/23/2021 0907   BILITOT 0.4 07/23/2021 0907   PROT 6.8 07/23/2021 0907   ALBUMIN 3.6 07/23/2021 0907    RADIOGRAPHIC STUDIES: DG Chest 2 View  Result Date: 07/03/2021 CLINICAL DATA:  Shortness of breath and fatigue. EXAM: CHEST - 2 VIEW COMPARISON:  Chest x-ray 06/17/2021, CT chest 04/25/2021 FINDINGS: The heart and mediastinal contours are unchanged. Status post left upper lobectomy. No pulmonary edema. Possible increase in trace to small volume left pleural effusion. No pneumothorax. No acute osseous abnormality. IMPRESSION: Possible increase in trace to small volume left pleural effusion in a patient status post left upper lobectomy. Electronically Signed   By: Iven Finn M.D.   On: 07/03/2021 15:16   CT Angio Chest PE W/Cm &/Or Wo Cm  Result Date: 07/03/2021 CLINICAL DATA:  PE suspected, high prob EXAM: CT ANGIOGRAPHY CHEST WITH CONTRAST TECHNIQUE: Multidetector CT imaging of the chest was performed using the standard protocol during bolus administration of intravenous contrast. Multiplanar CT image reconstructions and MIPs were obtained to evaluate the vascular anatomy. CONTRAST:  75m OMNIPAQUE IOHEXOL 350 MG/ML SOLN COMPARISON:  Same day radiograph, PET-CT 06/01/2021, chest CT 04/25/2021 FINDINGS: Cardiovascular: Mildly enlarged heart. Coronary artery calcifications. No pericardial disease. Normal size main pulmonary artery. The branch pulmonary arteries are mildly enlarged with tortuosity, which can be seen in chronic pulmonary hypertension. There are pulmonary emboli in the right lower lobar segmental arteries extending into the  subsegmental branches. RV: LV ratio is 0.9. No evidence of right heart strain. Mediastinum/Nodes: Prominent hilar and mediastinal lymph nodes, favored to be reactive. The thyroid is unremarkable. Esophagus is unremarkable. Lungs/Pleura: Postsurgical changes of left upper lobectomy. The remaining airways are patent. There is mild bronchial wall thickening. There are patchy ground-glass opacities bilaterally, worst in the mid to upper lungs, new since prior chest CT in August. Associated mild interlobular septal thickening. Unchanged 8 mm right upper lobe pulmonary nodule (series 6, image 45). Moderate size left pleural effusion with adjacent basilar atelectasis. Upper Abdomen: No acute findings. Musculoskeletal: No acute osseous abnormality.  No suspicious lytic or blastic lesions. Review of the MIP images confirms the above findings. IMPRESSION: Right lower lobe segmental pulmonary emboli, extending into the subsegmental branches. No evidence of right heart strain. Multifocal ground-glass opacities bilaterally, worst in the mid to upper lungs, new since prior PET-CT in September, consistent with multifocal pneumonia. Appearance can be also seen with atypical viral infection including COVID-19. Recommend follow-up to resolution. Postsurgical changes of left upper lobectomy, including a moderate size left pleural effusion. Unchanged 8 mm right upper lobe pulmonary nodule. Recommend continued oncologic follow-up with follow-up chest CT as clinically indicated. These results were called by telephone at the time of interpretation on 07/03/2021 at 9:37 pm to ED provider, who verbally acknowledged these results. Electronically Signed   By: Maurine Simmering M.D.   On: 07/03/2021 21:43   MR Brain W Wo Contrast  Result Date: 07/18/2021 CLINICAL DATA:  Metastatic lung cancer, radiation treatment planning EXAM: MRI HEAD WITHOUT AND WITH CONTRAST TECHNIQUE: Multiplanar, multiecho pulse sequences of the brain and surrounding  structures were obtained without and with intravenous contrast. CONTRAST:  24m GADAVIST GADOBUTROL 1 MMOL/ML IV SOLN COMPARISON:  Brain MRI 07/10/2021 FINDINGS: Brain: Postsurgical changes reflecting right parietal craniotomy are again seen. Mild pachymeningeal thickening underlying the craniotomy site is favored to reflect postsurgical change. A 2.2 cm by 1.8 cm by 2.6 peripherally enhancing lesion in the right frontal lobe is decreased in size, previously measured 2.6 cm x 2.1 cm by 2.8. Intrinsic T1 hyperintensity and SWI signal dropout within the lesion or consistent with blood products. Edema surrounding this lesion is not significantly changed. The 8 mm by 4 mm enhancing lesion in the left parietal lobe is not significantly changed in size. This lesion also demonstrates blood products. Mild edema surrounding this lesion is minimally increased since 07/10/2021. There is a 4 mm rounded enhancing lesion in the left parietal lobe white matter which was not definitely present on the prior study (1100-201) There is no other definite new lesion. There is no evidence of acute infarct. The ventricles are stable in size. There is no midline shift. Vascular: Normal flow voids. Skull and upper cervical spine: Normal marrow signal. Sinuses/Orbits: The paranasal sinuses are clear. Bilateral lens implants are in place. The globes and orbits are otherwise unremarkable. Other: None. IMPRESSION: 1. Slightly decreased size of the peripherally enhancing lesion in the right frontal lobe at the site of previously resected metastasis. Edema surrounding this lesion is not significantly changed. 2. Unchanged 8 mm hemorrhagic lesion in the left parietal lobe with slightly increased surrounding vasogenic edema. 3. New 4 mm lesion in the left parietal lobe white matter. Electronically Signed   By: PValetta MoleM.D.   On: 07/18/2021 14:36   MR BRAIN W WO CONTRAST  Result Date: 07/10/2021 CLINICAL DATA:  Metastatic lung cancer. EXAM:  MRI HEAD WITHOUT AND WITH CONTRAST TECHNIQUE: Multiplanar, multiecho pulse sequences of the brain and surrounding structures were obtained without and with intravenous contrast. CONTRAST:  9.530mGADAVIST GADOBUTROL 1 MMOL/ML IV SOLN COMPARISON:  05/23/2021 FINDINGS: Brain: A peripherally enhancing lesion in the high posterior right frontal lobe with chronic blood products at the site of a previous resection has enlarged and now measures 31 x 21 mm (series 11, image 41). Mild surrounding edema is unchanged. There is a new 8 mm enhancing lesion laterally in the left parietal lobe with mild edema and susceptibility artifact compatible with blood products (series 11, image 34). Small T2 hyperintensities elsewhere in the cerebral white matter  bilaterally are unchanged and nonspecific but compatible with mild chronic small vessel ischemic disease. The ventricles are normal in size. There is minimal extra-axial fluid subjacent to the craniotomy. No acute infarct or midline shift is evident. Vascular: Major intracranial vascular flow voids are preserved. Skull and upper cervical spine: Vertex craniotomy. No suspicious marrow lesion. Sinuses/Orbits: Bilateral cataract extraction. Paranasal sinuses and mastoid air cells are clear. Other: None. IMPRESSION: 1. New 8 mm left parietal metastasis. 2. Enlargement of peripherally enhancing posterior right frontal lesion. Electronically Signed   By: [name] Bores M.D.   On: 07/10/2021 18:37   ECHOCARDIOGRAM COMPLETE  Result Date: 07/05/2021    ECHOCARDIOGRAM REPORT   Patient Name:   KARRYN KOSINSKI Date of Exam: 07/05/2021 Medical Rec #:  [id number]      Height:       66.7 in Accession #:    [id number]     Weight:       203.9 lb Date of Birth:  08/27/1946       BSA:          2.033 m Patient Age:    9 years       BP:           114/60 mmHg Patient Gender: F              HR:           103 bpm. Exam Location:  Inpatient Procedure: 2D Echo Indications:    Pulmonary Embolus I26.09   History:        Patient has prior history of Echocardiogram examinations, most                 recent 11/25/2013. CT chest which showed PE with no right heart                 strain; Risk Factors:Dyslipidemia. Past history of breast                 cancer, recent lung cancer. GERD.  Sonographer:    Merrie Roof RDCS Referring Phys: [place]  1. Hyperdynamic LV function; mild AS by doppler (mean gradient 22 mmHg but partially explained by hyperdynamic LV function; visually aortic valve opens well).  2. Left ventricular ejection fraction, by estimation, is >75%. The left ventricle has hyperdynamic function. The left ventricle has no regional wall motion abnormalities. Left ventricular diastolic parameters were normal.  3. Right ventricular systolic function is normal. The right ventricular size is normal.  4. The mitral valve is normal in structure. Trivial mitral valve regurgitation. No evidence of mitral stenosis.  5. The aortic valve is tricuspid. Aortic valve regurgitation is not visualized. Mild aortic valve stenosis.  6. The inferior vena cava is normal in size with greater than 50% respiratory variability, suggesting right atrial pressure of 3 mmHg. FINDINGS  Left Ventricle: Left ventricular ejection fraction, by estimation, is >75%. The left ventricle has hyperdynamic function. The left ventricle has no regional wall motion abnormalities. The left ventricular internal cavity size was normal in size. There is no left ventricular hypertrophy. Left ventricular diastolic parameters were normal. Right Ventricle: The right ventricular size is normal. Right ventricular systolic function is normal. Left Atrium: Left atrial size was normal in size. Right Atrium: Right atrial size was normal in size. Pericardium: There is no evidence of pericardial effusion. Mitral Valve: The mitral valve is normal in structure. Trivial mitral valve regurgitation. No evidence of mitral valve stenosis. Tricuspid  Valve: The tricuspid  valve is normal in structure. Tricuspid valve regurgitation is trivial. No evidence of tricuspid stenosis. Aortic Valve: The aortic valve is tricuspid. Aortic valve regurgitation is not visualized. Mild aortic stenosis is present. Aortic valve mean gradient measures 22.0 mmHg. Aortic valve peak gradient measures 30.6 mmHg. Aortic valve area, by VTI measures 1.77 cm. Pulmonic Valve: The pulmonic valve was normal in structure. Pulmonic valve regurgitation is trivial. No evidence of pulmonic stenosis. Aorta: The aortic root is normal in size and structure. Venous: The inferior vena cava is normal in size with greater than 50% respiratory variability, suggesting right atrial pressure of 3 mmHg. IAS/Shunts: No atrial level shunt detected by color flow Doppler. Additional Comments: Hyperdynamic LV function; mild AS by doppler (mean gradient 22 mmHg but partially explained by hyperdynamic LV function; visually aortic valve opens well).  LEFT VENTRICLE PLAX 2D LVIDd:         4.30 cm   Diastology LVIDs:         2.30 cm   LV e' medial:    11.10 cm/s LV PW:         1.10 cm   LV E/e' medial:  11.4 LV IVS:        1.10 cm   LV e' lateral:   12.50 cm/s LVOT diam:     2.00 cm   LV E/e' lateral: 10.1 LV SV:         84 LV SV Index:   41 LVOT Area:     3.14 cm  RIGHT VENTRICLE RV Basal diam:  2.90 cm LEFT ATRIUM             Index        RIGHT ATRIUM           Index LA diam:        3.40 cm 1.67 cm/m   RA Area:     13.10 cm LA Vol (A2C):   52.6 ml 25.87 ml/m  RA Volume:   29.20 ml  14.36 ml/m LA Vol (A4C):   31.7 ml 15.59 ml/m LA Biplane Vol: 41.3 ml 20.32 ml/m  AORTIC VALVE AV Area (Vmax):    1.83 cm AV Area (Vmean):   1.77 cm AV Area (VTI):     1.77 cm AV Vmax:           276.67 cm/s AV Vmean:          197.333 cm/s AV VTI:            0.477 m AV Peak Grad:      30.6 mmHg AV Mean Grad:      22.0 mmHg LVOT Vmax:         161.00 cm/s LVOT Vmean:        111.000 cm/s LVOT VTI:          0.268 m LVOT/AV VTI  ratio: 0.56  AORTA Ao Root diam: 2.70 cm Ao Asc diam:  2.80 cm MITRAL VALVE MV Area (PHT): 4.63 cm     SHUNTS MV Decel Time: 164 msec     Systemic VTI:  0.27 m MV E velocity: 126.00 cm/s  Systemic Diam: 2.00 cm MV A velocity: 131.00 cm/s MV E/A ratio:  0.96 Kirk Ruths MD Electronically signed by Kirk Ruths MD Signature Date/Time: 07/05/2021/12:35:07 PM    Final    VAS [name] LOWER EXTREMITY VENOUS (DVT)  Result Date: 07/05/2021  Lower Venous DVT Study Patient Name:  ALEXAH KIVETT  Date of Exam:   07/05/2021 Medical Rec #:  [id number]       Accession #:    [id number] Date of Birth: Dec 12, 1945        Patient Gender: F Patient Age:   52 years Exam Location:  [facility] Procedure:      VAS [name] LOWER EXTREMITY VENOUS (DVT) Referring Phys: RAVI PAHWANI --------------------------------------------------------------------------------  Indications: Pulmonary embolism. Other Indications: Left leg immobility. Risk Factors: Surgery 06-13-2021 Robotic assisted thorascopy- left upper lobectomy. Anticoagulation: Eliquis. Comparison       06-05-2021 Left lower extremity venous study was negative for Study:           DVT. Performing Technologist: Darlin Coco RDMS, RVT  Examination Guidelines: A complete evaluation includes B-mode imaging, spectral Doppler, color Doppler, and power Doppler as needed of all accessible portions of each vessel. Bilateral testing is considered an integral part of a complete examination. Limited examinations for reoccurring indications may be performed as noted. The reflux portion of the exam is performed with the patient in reverse Trendelenburg.  +---------+---------------+---------+-----------+----------+--------------+ RIGHT    CompressibilityPhasicitySpontaneityPropertiesThrombus Aging +---------+---------------+---------+-----------+----------+--------------+ CFV      Full           Yes      Yes                                  +---------+---------------+---------+-----------+----------+--------------+ SFJ      Full                                                        +---------+---------------+---------+-----------+----------+--------------+ FV Prox  Full                                                        +---------+---------------+---------+-----------+----------+--------------+ FV Mid   Full                                                        +---------+---------------+---------+-----------+----------+--------------+ FV DistalFull                                                        +---------+---------------+---------+-----------+----------+--------------+ PFV      Full                                                        +---------+---------------+---------+-----------+----------+--------------+ POP      Full           Yes      Yes                                 +---------+---------------+---------+-----------+----------+--------------+  PTV      Full                                                        +---------+---------------+---------+-----------+----------+--------------+ PERO     Full                                                        +---------+---------------+---------+-----------+----------+--------------+ Gastroc  Full                                                        +---------+---------------+---------+-----------+----------+--------------+   +---------+---------------+---------+-----------+----------+--------------+ LEFT     CompressibilityPhasicitySpontaneityPropertiesThrombus Aging +---------+---------------+---------+-----------+----------+--------------+ CFV      Full           Yes      Yes                                 +---------+---------------+---------+-----------+----------+--------------+ SFJ      Full                                                         +---------+---------------+---------+-----------+----------+--------------+ FV Prox  Full                                                        +---------+---------------+---------+-----------+----------+--------------+ FV Mid   Full                                                        +---------+---------------+---------+-----------+----------+--------------+ FV DistalFull                                                        +---------+---------------+---------+-----------+----------+--------------+ PFV      Full                                                        +---------+---------------+---------+-----------+----------+--------------+ POP      None           No       No  Acute          +---------+---------------+---------+-----------+----------+--------------+ PTV      None                                         Acute          +---------+---------------+---------+-----------+----------+--------------+ PERO     None                                         Acute          +---------+---------------+---------+-----------+----------+--------------+ Gastroc  Full                                                        +---------+---------------+---------+-----------+----------+--------------+     Summary: RIGHT: - There is no evidence of deep vein thrombosis in the lower extremity.  - No cystic structure found in the popliteal fossa.  LEFT: - Findings consistent with acute deep vein thrombosis involving the left popliteal vein, left posterior tibial veins, and left peroneal veins. - No cystic structure found in the popliteal fossa.  *See table(s) above for measurements and observations. Electronically signed by Orlie Pollen on 07/05/2021 at 3:15:00 PM.    Final     PERFORMANCE STATUS (ECOG) : 2 - Symptomatic, <50% confined to bed  Review of Systems  Gastrointestinal:  Positive for abdominal pain.  Neurological:  Positive for  dizziness and weakness.  Unless otherwise noted, a complete review of systems is negative.  Physical Exam General: NAD, in wheelchair  Cardiovascular: regular rate and rhythm Pulmonary: clear ant fields, normal breathing pattern, diminished on left   Abdomen: firm, nontender, hypoactive  Extremities: no edema, no joint deformities, left leg weakness Neurological: AAO x3, mood appropriate  IMPRESSION:  Mrs. Brune presents today in a wheelchair with daughter, Tara Murphy. Denies pain. Does endorse continued decrease appetite but somewhat improved from last visit. Occasional constipation. Fatigue and dizziness at times when walking.   Feels as though fatigue has improved since starting steroids. Feeling better emotionally. Anxious to start immunotherapy (Keytruda) on tomorrow in addition to radiation this week. She expresses hopes that her fatigue will be manageable compared to previous experience with radiation. Discussed at length ways to cope with fatigue including taking rest breaks, not pushing self with understanding not able to do things as she previously were able to months ago, increase in nutrition, and taking naps when tired. She verbalized understanding and appreciation.   Discussed continued use of Miralax bowel regimen to prevent constipation. Does share occasional dark stools with no obvious signs of bleeding. This has not occurred regularly. Advised if continues to notice dark stools or blood to contact office immediately for further evaluation.   She has received wheelchair and bedside commode in the home which she is appreciative of.   Some shortness of breath with exertion however oxygen saturations have been in the 90s without concern. She has home oxygen which she uses as needed but has not needed recently.   All questions answered and support provided.   PLAN: Will have first [place] treatment on tomorrow along with radiation this week.  Continue Miralax to prevent  constipation. If noticeable dark, tarry stools or blood will contact office immediately for further evaluation. May consider adding Senna or Miralax twice daily.  Will plan for further abdominal imaging today as discussed with Lacie, NP to rule out any abnormalities.  Will take frequent rest breaks, small frequent meals, naps when tired, and increase protein to assist in decreased appetite an fatigue.  Will plan to follow up in 1-2 weeks in collaboration with other Oncology appointments.    Patient expressed understanding and was in agreement with this plan. She also understands that She can call the clinic at any time with any questions, concerns, or complaints.     Time Total: 35 min.   Visit consisted of counseling and education dealing with the complex and emotionally intense issues of symptom management and palliative care in the setting of serious and potentially life-threatening illness.Greater than 50%  of this time was spent counseling and coordinating care related to the above assessment and plan.  Signed by: Alda Lea, AGPCNP-BC Palliative Medicine Team

## 2021-07-23 NOTE — Progress Notes (Signed)
  Outpatient Palliative Care  ([phone]) [phone] ________________________________ Nursing Assessment:  Name: Tara Murphy        MRN: [id number]  Date of Service: 07/23/2021 DOB: 04/05/1946 Visit Type: Follow up-pt reports that [facility] has helped with using the bathroom at night and that the wheelchair has allowed her to do more during the day  Support at Visit: Tara Murphy, daughter  Review of Systems: General: Tara Murphy reports fatigue improving with steroids. Not having to nap during the day. Reports she may have lost some weight, but is still eating well. Reports sleeping well at night.   Neuro: Tara Murphy states she is still dizzy when standing. She describes it as "disorienting".  Cardiac:  None Pulmonary: Tara Murphy reports [facility] with sitting and with activity. She is not on oxygen anymore. Regan Rakers, NP is aware.   GI: Reports a normal appetite. Likes citrus flavored food. States she hasn't been drinking enough water but is working on this.  Stated she may have some constipation-having 1 small BM a day.  GU: None Integumentary: None Psychological: Tara Murphy states she gets anxious every once in a while but manages this with prn medications and by keeping herself busy. States she feels sad at times due to the limitations of her illness. Listening and emotional support provided.       Pain Review: Scale of 0-10: 0/No pain Location: Description: Frequency: Relief:   Social Review: Living Situation: Lives with husband, Tara Murphy. Tara Murphy states that he is there to help her "just in case".  Support: Tara Murphy (husband), Tara Murphy (daughter)    Falls in the last 3 months? No, but Tara Murphy is anxious of falling.   Assistive Device Use? Wheelchair/Walker  Functional Status: Minimal assist. Tara Murphy said she is able to bathe and dress herself and get herself to her chair. She states her husband is nearby if she were to need anything. She said that she realizes, when she stands with  her walker, that she does have more strength than she realized.   ACP Form Review: Has documents in [place]   Family/Patient Concerns: SHOB-Lacie, NP aware and sending Tara Murphy to radiology  Provider notified of assessment and patient/family concerns. Patient instructed to call with any questions or concerns.

## 2021-07-23 NOTE — Telephone Encounter (Signed)
Scheduled per 10/31 los, pt has been called and confirmed appt

## 2021-07-24 ENCOUNTER — Encounter: Payer: Self-pay | Admitting: Thoracic Surgery (Cardiothoracic Vascular Surgery)

## 2021-07-24 ENCOUNTER — Ambulatory Visit (INDEPENDENT_AMBULATORY_CARE_PROVIDER_SITE_OTHER): Payer: Self-pay | Admitting: Thoracic Surgery (Cardiothoracic Vascular Surgery)

## 2021-07-24 ENCOUNTER — Inpatient Hospital Stay: Payer: Medicare Other | Attending: Internal Medicine | Admitting: Internal Medicine

## 2021-07-24 ENCOUNTER — Inpatient Hospital Stay: Payer: Medicare Other

## 2021-07-24 ENCOUNTER — Telehealth: Payer: Self-pay

## 2021-07-24 ENCOUNTER — Telehealth: Payer: Self-pay | Admitting: Hematology

## 2021-07-24 VITALS — BP 135/76 | HR 75 | Resp 20 | Ht 66.0 in | Wt 202.0 lb

## 2021-07-24 VITALS — BP 151/69 | HR 93 | Temp 98.1°F | Resp 18

## 2021-07-24 DIAGNOSIS — Z5112 Encounter for antineoplastic immunotherapy: Secondary | ICD-10-CM | POA: Insufficient documentation

## 2021-07-24 DIAGNOSIS — R5383 Other fatigue: Secondary | ICD-10-CM | POA: Insufficient documentation

## 2021-07-24 DIAGNOSIS — Z86718 Personal history of other venous thrombosis and embolism: Secondary | ICD-10-CM | POA: Diagnosis not present

## 2021-07-24 DIAGNOSIS — C7931 Secondary malignant neoplasm of brain: Secondary | ICD-10-CM | POA: Insufficient documentation

## 2021-07-24 DIAGNOSIS — K5900 Constipation, unspecified: Secondary | ICD-10-CM | POA: Insufficient documentation

## 2021-07-24 DIAGNOSIS — C3412 Malignant neoplasm of upper lobe, left bronchus or lung: Secondary | ICD-10-CM | POA: Insufficient documentation

## 2021-07-24 DIAGNOSIS — F445 Conversion disorder with seizures or convulsions: Secondary | ICD-10-CM | POA: Insufficient documentation

## 2021-07-24 DIAGNOSIS — R531 Weakness: Secondary | ICD-10-CM | POA: Diagnosis not present

## 2021-07-24 DIAGNOSIS — Z923 Personal history of irradiation: Secondary | ICD-10-CM | POA: Insufficient documentation

## 2021-07-24 DIAGNOSIS — Z09 Encounter for follow-up examination after completed treatment for conditions other than malignant neoplasm: Secondary | ICD-10-CM

## 2021-07-24 DIAGNOSIS — R7401 Elevation of levels of liver transaminase levels: Secondary | ICD-10-CM | POA: Insufficient documentation

## 2021-07-24 DIAGNOSIS — Z853 Personal history of malignant neoplasm of breast: Secondary | ICD-10-CM | POA: Insufficient documentation

## 2021-07-24 DIAGNOSIS — R569 Unspecified convulsions: Secondary | ICD-10-CM

## 2021-07-24 DIAGNOSIS — Z79899 Other long term (current) drug therapy: Secondary | ICD-10-CM | POA: Diagnosis not present

## 2021-07-24 LAB — T4: T4, Total: 8.1 ug/dL (ref 4.5–12.0)

## 2021-07-24 MED ORDER — SODIUM CHLORIDE 0.9 % IV SOLN: Freq: Once | INTRAVENOUS | Status: AC

## 2021-07-24 MED ORDER — DEXAMETHASONE 2 MG PO TABS: 2.0000 mg | ORAL_TABLET | Freq: Two times a day (BID) | ORAL | Status: AC

## 2021-07-24 MED ORDER — SODIUM CHLORIDE 0.9 % IV SOLN
200.0000 mg | Freq: Once | INTRAVENOUS | Status: AC
  Administered 2021-07-24: 200 mg via INTRAVENOUS
  Filled 2021-07-24: qty 8

## 2021-07-24 NOTE — Patient Instructions (Signed)
[facility]COL[facility]  Discharge Instructions: Thank you for choosing [facility] to provide your oncology and hematology care.   If you have a lab appointment with the [facility], please go directly to the [facility] and check in at the registration area.   Wear comfortable clothing and clothing appropriate for easy access to any Portacath or PICC line.   We strive to give you quality time with your provider. You may need to reschedule your appointment if you arrive late (15 or more minutes).  Arriving late affects you and other patients whose appointments are after yours.  Also, if you miss three or more appointments without notifying the office, you may be dismissed from the clinic at the provider's discretion.      For prescription refill requests, have your pharmacy contact our office and allow 72 hours for refills to be completed.    Today you received the following chemotherapy and/or immunotherapy agents Tara Murphy      To help prevent nausea and vomiting after your treatment, we encourage you to take your nausea medication as directed.  BELOW ARE SYMPTOMS THAT SHOULD BE REPORTED IMMEDIATELY: *FEVER GREATER THAN 100.4 F (38 C) OR HIGHER *CHILLS OR SWEATING *NAUSEA AND VOMITING THAT IS NOT CONTROLLED WITH YOUR NAUSEA MEDICATION *UNUSUAL SHORTNESS OF BREATH *UNUSUAL BRUISING OR BLEEDING *URINARY PROBLEMS (pain or burning when urinating, or frequent urination) *BOWEL PROBLEMS (unusual diarrhea, constipation, pain near the anus) TENDERNESS IN MOUTH AND THROAT WITH OR WITHOUT PRESENCE OF ULCERS (sore throat, sores in mouth, or a toothache) UNUSUAL RASH, SWELLING OR PAIN  UNUSUAL VAGINAL DISCHARGE OR ITCHING   Items with * indicate a potential emergency and should be followed up as soon as possible or go to the Emergency Department if any problems should occur.  Please show the CHEMOTHERAPY ALERT CARD or IMMUNOTHERAPY ALERT CARD at check-in to  the Emergency Department and triage nurse.  Should you have questions after your visit or need to cancel or reschedule your appointment, please contact [facility]  Dept: [phone]  and follow the prompts.  Office hours are 8:00 a.m. to 4:30 p.m. Monday - Friday. Please note that voicemails left after 4:00 p.m. may not be returned until the following business day.  We are closed weekends and major holidays. You have access to a nurse at all times for urgent questions. Please call the main number to the clinic Dept: [phone] and follow the prompts.   For any non-urgent questions, you may also contact your provider using MyChart. We now offer e-Visits for anyone 58 and older to request care online for non-urgent symptoms. For details visit [email or web address].   Also download the MyChart app! Go to the app store, search "MyChart", open the app, select [facility], and log in with your MyChart username and password.  Due to Covid, a mask is required upon entering the hospital/clinic. If you do not have a mask, one will be given to you upon arrival. For doctor visits, patients may have 1 support person aged 30 or older with them. For treatment visits, patients cannot have anyone with them due to current Covid guidelines and our immunocompromised population.

## 2021-07-24 NOTE — Telephone Encounter (Signed)
Left message with follow-up appointments per 11/1 los.

## 2021-07-24 NOTE — Telephone Encounter (Signed)
I returned a missed call from [facility], Mrs. Franklyn's daughter. Per Regan Rakers, NP I relayed results from yesterday's imaging that Mrs. Sorci was experiencing constipation. Per Lexine Baton, NP, instructed to take a dose of Miralax in the morning and one at night until constipation clears. Explained that if loose stool were to occur, to decrease the dose back to once a day. Per Regan Rakers, NP originally scheduled [name] not needed, I relayed this information to [facility] and appt was canceled. Larene Beach verbalized understanding. All questions answered.

## 2021-07-24 NOTE — Progress Notes (Signed)
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  She started feeling poorly back in February 2022.  In June she developed a foot drop and peripheral neuropathy.  In August she had a seizure.  CT showed a brain metastasis in the right frontal parietal cortex.  She had a craniotomy and resection of the brain metastasis by Dr. Trenton Gammon in August.  A CT of the chest showed a large left upper lobe lung mass.  Given that she had an isolated brain metastasis she was offered surgical resection.  She understood that there was only about a 25% 5-year survival with surgery.  She had a robotic assisted left upper lobectomy on 06/13/2021.  Pathology was T2a, N0, M1.  She went to rehab after about a week in the hospital.  She was there for about 3 weeks.  She was readmitted a few days later with pneumonia and pulmonary emboli.  A follow-up brain MR showed a new parietal metastasis as well as suspicion for recurrence of the original site.  She started [place] today.  She is scheduled to have stereotactic radiation to the brain tomorrow.  She denies incisional pain.  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[place])    S/p total hip replacement left   Breast cancer ([place]) 2015   left    Cataract    Cholecystolithiasis    Chronic allergic rhinitis    Depression    Former smoker, stopped smoking in distant past 10/05/2019   Quit 2000; 52 y smoking history   GERD (gastroesophageal reflux disease)    Hepatic steatosis 06/08/2020   Intermittent elevated LFTs and ultrasound 2018   Hyperlipemia    IBS (irritable  bowel syndrome) 10/08/2018   Nl colonoscopy and EGD 09/2018   IBS (irritable bowel syndrome)    Lichen plano-pilaris    Lung cancer ([place])    Major depression, 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    Personal history of radiation therapy 2015   Seizures ([facility])    Urinary tract infection    hx of frequent     Current Outpatient Medications  Medication Sig Dispense Refill   acetaminophen (TYLENOL) 325 MG tablet Take 1-2 tablets (325-650 mg total) by mouth every 4 (four) hours as needed for mild pain.     APIXABAN (ELIQUIS) VTE STARTER PACK (10MG  AND 5MG ) Take as directed on package: start with two-5mg  tablets twice daily for 7 days. On day 8, switch to one-5mg  tablet twice daily. 74 each 0   busPIRone (BUSPAR) 7.5 MG tablet Take 1 tablet (7.5 mg total) by mouth 2 (two) times daily as needed (anxiety). 30 tablet 0   clobetasol (TEMOVATE) 0.05 % external solution Apply 1 application topically daily as needed (irritation).     dexamethasone (DECADRON) 2 MG tablet Take 1 tablet (2 mg total) by mouth 2 (two) times daily with  a meal.     fluticasone (FLONASE) 50 MCG/ACT nasal spray Place 1 spray into both nostrils daily as needed for allergies or rhinitis.     gabapentin (NEURONTIN) 300 MG capsule Take 2 capsules (600 mg total) by mouth at bedtime.     levETIRAcetam (KEPPRA) 500 MG tablet Take 500 mg by mouth 2 (two) times daily.     melatonin 3 MG TABS tablet Take 1 tablet (3 mg total) by mouth at bedtime. 30 tablet 0   Omeprazole 20 MG TBDD Take 20 mg by mouth daily as needed (indigestion). OTC     rOPINIRole (REQUIP) 4 MG tablet Take 1 tablet (4 mg total) by mouth at bedtime. 30 tablet 0   No current facility-administered medications for this visit.    Physical Exam BP 135/76   Pulse 75   Resp 20   Ht 5\' 6"  (1.676 m)   Wt 202 lb (91.6 kg)   SpO2  98% Comment: RA  BMI 32.60 kg/m  Obese 75 year old woman in no acute distress Alert and oriented. Lungs diminished at left base but otherwise clear Incisions well-healed  Diagnostic Tests: I personally reviewed her chest x-ray.  Shows postoperative changes with a loculated left pleural effusion.  Similar findings noted on her CT from September.  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  She presented with left-sided weakness and left foot drop.  Work-up showed a brain mass.  She had a craniotomy and resection.  This was found to be metastatic lung cancer.  She then underwent a left upper lobectomy.  Unfortunately, she always has evidence of new brain metastasis.  I suspect that metastasis had occurred prior to her surgery but only now showing up on images.  She is to be treated for that tomorrow.  She did have pulmonary emboli after discharge from rehab.  She is on anticoagulation.  She is not having any significant incisional pain.  Plan: No restrictions from my standpoint. I will plan to see her back in about 6 months to check on her progress  Melrose Nakayama, MD [facility] [phone]

## 2021-07-24 NOTE — Progress Notes (Signed)
I connected with Tara Murphy on 07/24/21 at 12:30 PM EDT by telephone visit and verified that I am speaking with the correct person using two identifiers.  I discussed the limitations, risks, security and privacy concerns of performing an evaluation and management service by telemedicine and the availability of in-person appointments. I also discussed with the patient that there may be a patient responsible charge related to this service. The patient expressed understanding and agreed to proceed.  Other persons participating in the visit and their role in the encounter:  n/a  Patient's location:  Home  Provider's location:  Office  Chief Complaint:  Brain metastasis ([place])  Seizure ([place])  History of Present Ilness: Tara Murphy describes clear improvement in left sided weakness since starting the decadron.  She has more functional use of the left arm and leg.  She is currently down to 2mg  in AM, 2mg  in PM, tolerating it well.  Underwent first keytruda infusion this morning, no significant issues aside from modest fatigue afterwards.  Has SRS to brain lesions scheduled for this Thursday.    Observations: Language and cognition at baseline  Assessment and Plan: Brain metastasis ([place])  Seizure ([place])  Clinically improved today.  We recommended continuing decadron 2mg  BID in the short term, especially considering upcoming SRS x2.  In two weeks, if feeling well she will cut the evening dose in half (1mg ).  We can continue to adjust the decadron from their depending on clinical status.  Follow Up Instructions: Follow up with brain tumor clinic and/or radiation oncology clinic following SRS  I discussed the assessment and treatment plan with the patient.  The patient was provided an opportunity to ask questions and all were answered.  The patient agreed with the plan and demonstrated understanding of the instructions.    The patient was advised to call back or seek an in-person evaluation if the  symptoms worsen or if the condition fails to improve as anticipated.  I provided 5-10 minutes of non-face-to-face time during this enocunter.  Ventura Sellers, MD   I provided 15 minutes of non face-to-face telephone visit time during this encounter, and > 50% was spent counseling as documented under my assessment & plan.

## 2021-07-25 ENCOUNTER — Ambulatory Visit: Payer: Medicare Other | Admitting: Radiation Oncology

## 2021-07-25 ENCOUNTER — Ambulatory Visit (HOSPITAL_BASED_OUTPATIENT_CLINIC_OR_DEPARTMENT_OTHER): Payer: Medicare Other | Admitting: Physical Therapy

## 2021-07-26 ENCOUNTER — Other Ambulatory Visit: Payer: Self-pay

## 2021-07-26 ENCOUNTER — Encounter: Payer: Self-pay | Admitting: Radiation Oncology

## 2021-07-26 ENCOUNTER — Ambulatory Visit
Admission: RE | Admit: 2021-07-26 | Discharge: 2021-07-26 | Disposition: A | Discharge status: Home / Self Care | Payer: Medicare Other | Source: Ambulatory Visit | Attending: Radiation Oncology | Admitting: Radiation Oncology

## 2021-07-26 VITALS — BP 144/69 | HR 81 | Temp 97.7°F | Resp 20

## 2021-07-26 DIAGNOSIS — C7931 Secondary malignant neoplasm of brain: Secondary | ICD-10-CM

## 2021-07-26 DIAGNOSIS — C3412 Malignant neoplasm of upper lobe, left bronchus or lung: Secondary | ICD-10-CM | POA: Diagnosis not present

## 2021-07-26 NOTE — Progress Notes (Signed)
  Name: Tara Murphy  MRN: [id number]  Date: 07/26/2021   DOB: [birth year]  Stereotactic Radiosurgery Operative Note  PRE-OPERATIVE DIAGNOSIS:  Multiple Brain Metastases  POST-OPERATIVE DIAGNOSIS:  Multiple Brain Metastases  PROCEDURE:  Stereotactic Radiosurgery  SURGEON:  Charlie Pitter, MD 2 NARRATIVE: The patient underwent a radiation treatment planning session in the radiation oncology simulation suite under the care of the radiation oncology physician and physicist.  I participated closely in the radiation treatment planning afterwards. The patient underwent planning CT which was fused to 3T high resolution MRI with 1 mm axial slices.  These images were fused on the planning system.  We contoured the gross target volumes and subsequently expanded this to yield the Planning Target Volume. I actively participated in the planning process.  I helped to define and review the target contours and also the contours of the optic pathway, eyes, brainstem and selected nearby organs at risk.  All the dose constraints for critical structures were reviewed and compared to AAPM Task Group 101.  The prescription dose conformity was reviewed.  I approved the plan electronically.    Accordingly, Tara Murphy was brought to the TrueBeam stereotactic radiation treatment linac and placed in the custom immobilization mask.  The patient was aligned according to the IR fiducial markers with BrainLab Exactrac, then orthogonal x-rays were used in ExacTrac with the 6DOF robotic table and the shifts were made to align the patient  [facility] received stereotactic radiosurgery uneventfully.    Lesions treated:  2   Complex lesions treated:  0 (>3.5 cm, <66mm of optic path, or within the brainstem)   The detailed description of the procedure is recorded in the radiation oncology procedure note.  I was present for the duration of the procedure.  DISPOSITION:  Following delivery, the patient was transported to nursing  in stable condition and monitored for possible acute effects to be discharged to home in stable condition with follow-up in one month.  Charlie Pitter, MD 07/26/2021 1:24 PM

## 2021-07-26 NOTE — Progress Notes (Signed)
  Radiation Oncology         [phone]) [phone] ________________________________  Stereotactic Treatment Procedure Note  Name: Tara Murphy MRN: [id number]  Date: 07/26/2021  DOB: 01-Apr-1946  SPECIAL TREATMENT PROCEDURE    ICD-10-CM   1. Brain metastasis ([place])  C79.31       3D TREATMENT PLANNING AND DOSIMETRY:  The patient's radiation plan was reviewed and approved by neurosurgery and radiation oncology prior to treatment.  It showed 3-dimensional radiation distributions overlaid onto the planning CT/MRI image set.  The [facility] for the target structures as well as the organs at risk were reviewed. The documentation of the 3D plan and dosimetry are filed in the radiation oncology EMR.  NARRATIVE:  Tara Murphy was brought to the TrueBeam stereotactic radiation treatment machine and placed supine on the CT couch. The head frame was applied, and the patient was set up for stereotactic radiosurgery.  Neurosurgery was present for the set-up and delivery  SIMULATION VERIFICATION:  In the couch zero-angle position, the patient underwent Exactrac imaging using the Brainlab system with orthogonal KV images.  These were carefully aligned and repeated to confirm treatment position for each of the isocenters.  The Exactrac snap film verification was repeated at each couch angle.  PROCEDURE: Tara Murphy received stereotactic radiosurgery to the following targets: Both of the left parietal (8 mm, 4 mm) brain mets were  treated using a single isocenter using 6 Rapid Arc VMAT Beams to a prescription dose of 20 Gy.  ExacTrac registration was performed for each couch angle.  The 100% isodose line was prescribed.  6 MV X-rays were delivered in the flattening filter free beam mode.  STEREOTACTIC TREATMENT MANAGEMENT:  Following delivery, the patient was transported to nursing in stable condition and monitored for possible acute effects.  Vital signs were recorded BP (!) 144/69 (BP Location: Right Arm, Patient  Position: Sitting, Cuff Size: Large)   Pulse 81   Temp 97.7 F (36.5 C)   Resp 20   SpO2 97% . The patient tolerated treatment without significant acute effects, and was discharged to home in stable condition.    PLAN: Follow-up in one month.  ________________________________  Sheral Apley. Tammi Klippel, M.D.

## 2021-07-27 ENCOUNTER — Ambulatory Visit (HOSPITAL_COMMUNITY): Payer: Medicare Other

## 2021-07-31 ENCOUNTER — Telehealth: Payer: Self-pay

## 2021-07-31 ENCOUNTER — Other Ambulatory Visit: Payer: Self-pay | Admitting: Nurse Practitioner

## 2021-07-31 DIAGNOSIS — C7931 Secondary malignant neoplasm of brain: Secondary | ICD-10-CM

## 2021-07-31 DIAGNOSIS — C3412 Malignant neoplasm of upper lobe, left bronchus or lung: Secondary | ICD-10-CM

## 2021-07-31 NOTE — Telephone Encounter (Signed)
I called Mrs. Klinkner to answer her question about receiving the flu and COVID vaccines. Per Lexine Baton, NP, she is able to have both. I informed her they would need to be spaced out 2 weeks apart. She said she would get her flu vaccine from her PCP and then get her COVID booster from [name] at her next appt. I also followed up with her about her Decadron. She stated she was beginning to take less. Per Dr. Renda Rolls note, she's to take 2mg  BID for 2 weeks. I went over this with her and she verbalized understanding. She stated that she was feeling better with the Decadron and was able to cook a meal for the first time. I also informed her that she would be receiving a call to schedule her port-a-cath placement. All questions were answered. Understanding was verbalized. Instructed to call back with any questions/concerns.

## 2021-08-01 ENCOUNTER — Other Ambulatory Visit: Payer: Self-pay | Admitting: Nurse Practitioner

## 2021-08-03 ENCOUNTER — Encounter
Payer: Medicare Other | Attending: Physical Medicine and Rehabilitation | Admitting: Physical Medicine and Rehabilitation

## 2021-08-03 ENCOUNTER — Other Ambulatory Visit: Payer: Self-pay

## 2021-08-03 VITALS — BP 130/77 | HR 90

## 2021-08-03 DIAGNOSIS — C7931 Secondary malignant neoplasm of brain: Secondary | ICD-10-CM | POA: Insufficient documentation

## 2021-08-03 DIAGNOSIS — G609 Hereditary and idiopathic neuropathy, unspecified: Secondary | ICD-10-CM | POA: Insufficient documentation

## 2021-08-03 DIAGNOSIS — T732XXS Exhaustion due to exposure, sequela: Secondary | ICD-10-CM | POA: Insufficient documentation

## 2021-08-03 MED ORDER — MODAFINIL 100 MG PO TABS: 100.0000 mg | ORAL_TABLET | Freq: Every day | ORAL | 3 refills | Status: AC

## 2021-08-03 MED ORDER — VITAMIN D (ERGOCALCIFEROL) 1.25 MG (50000 UNIT) PO CAPS: 50000.0000 [IU] | ORAL_CAPSULE | ORAL | 0 refills | Status: AC

## 2021-08-03 NOTE — Patient Instructions (Signed)
N-acetyl-cysteine 600mg  BID

## 2021-08-03 NOTE — Progress Notes (Signed)
   Subjective:    Patient ID: Tara [facility], female    DOB: 16-May-1946, 75 y.o.   MRN: [id number]  HPI Tara Murphy is a 75 year old woman who presents to for hospital follow after CIR admission for brain metastasis.   1) Peripheral neuropathy -can be very severe -ready to try to [facility] today.   2) Cancer-related fatigue -would like to try modafinil.  -this is incredibly severe.  -she eats a lof of fruits- still tastes food to her.   3) Impaired mobility and ADLs -needs new referral for PT  4) Brain metastases: -end date for 2 years.    Review of Systems     Objective:   Physical Exam Gen: no distress, normal appearing HEENT: oral mucosa pink and moist, NCAT Cardio: Reg rate Chest: normal effort, normal rate of breathing Abd: soft, non-distended Ext: no edema Psych: pleasant, normal affect Skin: intact Neuro: Alert and oriented. Decreased sensation in both feet Musculoskeletal: Left foot weakness- unable to lift off wheelchair rest.     Assessment & Plan:  1) Cancer-related fatigue -prescribed modafinil 100mg   -Recommended starting NAC (N-Acetyl cysteine) 600mg  BID for her fatigue. Discussed its benefits in boosting glutathione and mitochondrial function.    2) Peripheral neuropathy: -Discussed Qutenza as an option for neuropathic pain control. Discussed that this is a capsaicin patch, stronger than capsaicin cream. Discussed that it is currently approved for diabetic peripheral neuropathy and post-herpetic neuralgia, but that it has also shown benefit in treating other forms of neuropathy. Provided patient with link to site to learn more about the patch: [employer]. Discussed that the patch would be placed in office and benefits usually last 3 months. Discussed that unintended exposure to capsaicin can cause severe irritation of eyes, mucous membranes, respiratory tract, and skin, but that Qutenza is a local treatment and does not have the systemic side  effects of other nerve medications. Discussed that there may be pain, itching, erythema, and decreased sensory function associated with the application of Qutenza. Side effects usually subside within 1 week. A cold pack of analgesic medications can help with these side effects. Blood pressure can also be increased due to pain associated with administration of the patch.  4 patches of Qutenza was applied to the area of pain. Ice packs were applied during the procedure to ensure patient comfort. Blood pressure was monitored every 15 minutes. The patient tolerated the procedure well. Post-procedure instructions were given and follow-up has been scheduled.    3) Impaired mobility -provided referral to PT  >40 minutes spent in discussion of her cancer-related fatigue, in applying bilateral Qutenza 4 patches, discussing typical response, plan for repeat Qutenza, discussion of difficulties with commode at home and providing resource to help with this, providing script for physical therapy

## 2021-08-06 ENCOUNTER — Other Ambulatory Visit: Payer: Self-pay | Admitting: Nurse Practitioner

## 2021-08-06 MED ORDER — APIXABAN 5 MG PO TABS: 5.0000 mg | ORAL_TABLET | Freq: Two times a day (BID) | ORAL | 3 refills | Status: AC

## 2021-08-10 ENCOUNTER — Other Ambulatory Visit (HOSPITAL_COMMUNITY): Payer: Self-pay | Admitting: Physician Assistant

## 2021-08-12 ENCOUNTER — Other Ambulatory Visit: Payer: Self-pay | Admitting: Radiology

## 2021-08-12 NOTE — Progress Notes (Addendum)
[facility]   [phone] [phone]   Clinic Follow up Note   Patient Care Team: Leamon Arnt, MD as PCP - General (Family Medicine) Loletha Carrow, Kirke Corin, MD as Consulting Physician (Gastroenterology) Ulla Gallo, MD as Consulting Physician (Dermatology) Wylene Simmer, MD as Consulting Physician (Orthopedic Surgery) Alda Berthold, DO as Consulting Physician (Neurology) Jimmy Footman, NP as Nurse Practitioner 08/14/2021  CHIEF COMPLAINT: Follow up metastatic lung cancer, brain mets   SUMMARY OF ONCOLOGIC HISTORY: Oncology History  Breast cancer of lower-outer quadrant of left female breast ([place]) (Resolved)  10/15/2013 Surgery   Left breast lumpectomy: Well-differentiated IDC grade 1, 1.1 cm with intermediate grade DCIS solid and cribriform, margins negative, no LVID, 0/2 sentinel nodes negative, T1 cN0 M0 stage IA, Oncotype DX score 8, (6% ROR) ER/PR positive HER-2 negative   11/15/2013 - 11/16/2013 Radiation Therapy   MammoSite partial breast radiation   12/17/2013 - 08/29/2015 Anti-estrogen oral therapy   Anastrozole 1 mg daily (severe fatigue, emotional changes, skin dryness), switched to tamoxifen 06/20/2015 (stopped due to depression)   Primary cancer of left upper lobe of lung ([place])  05/03/2021 Initial Diagnosis   Primary cancer of left upper lobe of lung ([place])   07/24/2021 -  Chemotherapy   Patient is on Treatment Plan : LUNG NSCLC flat dose Pembrolizumab Q21D       CURRENT THERAPY:  SRS to new left parietal brain met  Steroid taper, currently on dex 2 mg BID, plan to taper to 2 mg AM 1 mg PM Immunotherapy with pembrolizumab 07/24/21   INTERVAL HISTORY: Ms. Smethurst returns for follow up and treatment as scheduled prior to cycle 2 pembrolizumab.  She tolerated last cycle well with no obvious side effects.  She is tapering Dex 2 mg a.m. 1 mg p.m. for the last week.  She denies significant headaches.  Her weakness  fluctuates depending on her fatigue but overall better in the last few weeks.  She still has some intermittent abdominal tightness when she eats, bowels moving better.  She takes Tylenol up to 2000 mg daily for the last few days due to fatigue and bruising from port placement.  She denies any other significant pain.  For stress she has started drinking wine every night for the past 2-3 weeks.  Denies history of hepatitis or other liver disease.  Denies recent fever or chills.  She has generalized bruising, on Eliquis, denies bleeding.  All other systems were reviewed with the patient and are negative.  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[place])    S/p total hip replacement left   Breast cancer ([place]) 2015   left    Cataract    Cholecystolithiasis    Chronic allergic rhinitis    Depression    Former smoker, stopped smoking in distant past 10/05/2019   Quit 2000; 22 y smoking history   GERD (gastroesophageal reflux disease)    Hepatic steatosis 06/08/2020   Intermittent elevated LFTs and ultrasound 2018   Hyperlipemia    IBS (irritable bowel syndrome) 10/08/2018   Nl colonoscopy and EGD 09/2018   IBS (irritable bowel syndrome)    Lichen plano-pilaris    Lung cancer ([place])    Major depression, chronic 09/05/2015   Neuropathy, peripheral, idiopathic    uses gabapentin   Osteopenia 09/07/2019   Dexa 08/2019: T = -1.2 at wrist; back  and hips excluded (DJD and hardware).    Personal history of radiation therapy 2015   Seizures ([place])    Urinary tract infection    hx of frequent     SURGICAL HISTORY: Past Surgical History:  Procedure Laterality Date   APPENDECTOMY     APPLICATION OF CRANIAL NAVIGATION Right 04/30/2021   Procedure: APPLICATION OF CRANIAL NAVIGATION;  Surgeon: Earnie Larsson, MD;  Location: [facility];  Service: Neurosurgery;  Laterality: Right;   BREAST LUMPECTOMY Left 2015   CATARACT EXTRACTION  Bilateral    cataracts     CHOLECYSTECTOMY N/A 10/29/2016   Procedure: LAPAROSCOPIC CHOLECYSTECTOMY;  Surgeon: Erroll Luna, MD;  Location: [facility];  Service: General;  Laterality: N/A;   COLONOSCOPY     CRANIOTOMY Right 04/30/2021   Procedure: Right Parietal Craniotomy for tumor with Brain Lab;  Surgeon: Earnie Larsson, MD;  Location: [facility];  Service: Neurosurgery;  Laterality: Right;   EYE SURGERY     INTERCOSTAL NERVE BLOCK Left 06/13/2021   Procedure: INTERCOSTAL NERVE BLOCK;  Surgeon: Melrose Nakayama, MD;  Location: [facility];  Service: Thoracic;  Laterality: Left;   IR IMAGING GUIDED PORT INSERTION  08/13/2021   LIPOSUCTION     LYMPH NODE DISSECTION Left 06/13/2021   Procedure: LYMPH NODE DISSECTION;  Surgeon: Melrose Nakayama, MD;  Location: [facility];  Service: Thoracic;  Laterality: Left;   TOTAL HIP ARTHROPLASTY     bilateral hip arthoplasty   TOTAL HIP ARTHROPLASTY Right 12/18/2015   Procedure: RIGHT TOTAL HIP ARTHROPLASTY ANTERIOR APPROACH;  Surgeon: Gaynelle Arabian, MD;  Location: [facility];  Service: Orthopedics;  Laterality: Right;   TOTAL KNEE ARTHROPLASTY     TOTAL KNEE ARTHROPLASTY Left 11/25/2016   Procedure: LEFT TOTAL KNEE ARTHROPLASTY;  Surgeon: Gaynelle Arabian, MD;  Location: [facility];  Service: Orthopedics;  Laterality: Left;  with abductor block   TUBAL LIGATION     UPPER GASTROINTESTINAL ENDOSCOPY      I have reviewed the social history and family history with the patient and they are unchanged from previous note.  ALLERGIES:  is allergic to fluorescein, contrast media [iodinated diagnostic agents], hydromorphone hcl, other, sulfamethoxazole-trimethoprim, fentanyl, tape, and terbinafine and related.  MEDICATIONS:  Current Outpatient Medications  Medication Sig Dispense Refill   acetaminophen (TYLENOL) 325 MG tablet Take 1-2 tablets (325-650 mg total) by mouth every 4 (four) hours as needed for mild pain.     apixaban (ELIQUIS) 5 MG TABS tablet Take 1 tablet (5 mg  total) by mouth 2 (two) times daily. 60 tablet 3   benzonatate (TESSALON) 100 MG capsule Take 1 capsule (100 mg total) by mouth 3 (three) times daily as needed for cough. 30 capsule 0   busPIRone (BUSPAR) 7.5 MG tablet Take 1 tablet (7.5 mg total) by mouth 2 (two) times daily as needed (anxiety). 30 tablet 0   clobetasol (TEMOVATE) 0.05 % external solution Apply 1 application topically daily as needed (irritation).     dexamethasone (DECADRON) 2 MG tablet Take 1 tablet (2 mg total) by mouth 2 (two) times daily with a meal.     fluticasone (FLONASE) 50 MCG/ACT nasal spray Place 1 spray into both nostrils daily as needed for allergies or rhinitis.     gabapentin (NEURONTIN) 300 MG capsule Take 2 capsules (600 mg total) by mouth at bedtime.     levETIRAcetam (KEPPRA) 500 MG tablet Take 500 mg by mouth 2 (two) times daily.     lidocaine-prilocaine (EMLA) cream Apply 1 application topically as  needed. Apply 30 min prior to appointment. 30 g 0   melatonin 3 MG TABS tablet Take 1 tablet (3 mg total) by mouth at bedtime. 30 tablet 0   modafinil (PROVIGIL) 100 MG tablet Take 1 tablet (100 mg total) by mouth daily. 30 tablet 3   Omeprazole 20 MG TBDD Take 20 mg by mouth daily as needed (indigestion). OTC     rOPINIRole (REQUIP) 4 MG tablet Take 1 tablet (4 mg total) by mouth at bedtime. 30 tablet 0   Vitamin D, Ergocalciferol, (DRISDOL) 1.25 MG (50000 UNIT) CAPS capsule Take 1 capsule (50,000 Units total) by mouth every 7 (seven) days. 7 capsule 0   No current facility-administered medications for this visit.   Facility-Administered Medications Ordered in Other Visits  Medication Dose Route Frequency Provider Last Rate Last Admin   0.9 %  sodium chloride infusion   Intravenous Continuous Alla Feeling, NP 250 mL/hr at 08/14/21 1224 New Bag at 08/14/21 1224    PHYSICAL EXAMINATION: ECOG PERFORMANCE STATUS: 2 - Symptomatic, <50% confined to bed  There were no vitals filed for this visit. There were  no vitals filed for this visit.  GENERAL:alert, no distress and comfortable SKIN: generalized bruising EYES:  sclera clear LUNGS: clear with normal breathing effort HEART: regular rate & rhythm, moderate pitting lower extremity edema ABDOMEN:abdomen soft, non-tender and normal bowel sounds NEURO: alert & oriented x 3 with fluent speech PAC with significant skin irritation and bruising Exam performed in bed in infusion room  LABORATORY DATA:  I have reviewed the data as listed CBC Latest Ref Rng & Units 08/14/2021 07/23/2021 07/05/2021  WBC 4.0 - 10.5 K/uL 22.8(H) 31.1(H) 6.7  Hemoglobin 12.0 - 15.0 g/dL 10.1(L) 12.2 8.9(L)  Hematocrit 36.0 - 46.0 % 31.8(L) 37.1 27.9(L)  Platelets 150 - 400 K/uL 118(L) 520(H) 274     CMP Latest Ref Rng & Units 08/14/2021 07/23/2021 07/06/2021  Glucose 70 - 99 mg/dL 163(H) 240(H) 111(H)  BUN 8 - 23 mg/dL _0 Creatinine 0.44 - 1.00 mg/dL 0.55 0.69 0.40(L)  Sodium 135 - 145 mmol/L 136 136 138  Potassium 3.5 - 5.1 mmol/L 4.2 4.0 3.9  Chloride 98 - 111 mmol/L 105 103 108  CO2 22 - 32 mmol/L 23 21(L) 22  Calcium 8.9 - 10.3 mg/dL 8.3(L) 8.7(L) 8.1(L)  Total Protein 6.5 - 8.1 g/dL 5.5(L) 6.8 -  Total Bilirubin 0.3 - 1.2 mg/dL 0.8 0.4 -  Alkaline Phos 38 - 126 U/L 274(H) 108 -  AST 15 - 41 U/L 124(H) 20 -  ALT 0 - 44 U/L 388(HH) 40 -      RADIOGRAPHIC STUDIES: I have personally reviewed the radiological images as listed and agreed with the findings in the report. IR IMAGING GUIDED PORT INSERTION  Result Date: 08/13/2021 INDICATION: 75 year old female with history of advanced stage lung cancer requiring central venous access for immunotherapy. EXAM: IMPLANTED PORT A CATH PLACEMENT WITH ULTRASOUND AND FLUOROSCOPIC GUIDANCE COMPARISON:  None. ANESTHESIA/SEDATION: Moderate (conscious) sedation was employed during this procedure. A total of Versed 2 mg was administered intravenously. Moderate Sedation Time: 15 minutes. The patient's level of  consciousness and vital signs were monitored continuously by radiology nursing throughout the procedure under my direct supervision. CONTRAST:  None FLUOROSCOPY TIME:  0 minutes, 18 seconds (2 mGy) COMPLICATIONS: None immediate. PROCEDURE: The procedure, risks, benefits, and alternatives were explained to the patient. Questions regarding the procedure were encouraged and answered. The patient understands and consents to the procedure. The  right neck and chest were prepped with chlorhexidine in a sterile fashion, and a sterile drape was applied covering the operative field. Maximum barrier sterile technique with sterile gowns and gloves were used for the procedure. A timeout was performed prior to the initiation of the procedure. Ultrasound was used to examine the jugular vein which was compressible and free of internal echoes. A skin marker was used to demarcate the planned venotomy and port pocket incision sites. Local anesthesia was provided to these sites and the subcutaneous tunnel track with 1% lidocaine with 1:100,000 epinephrine. A small incision was created at the jugular access site and blunt dissection was performed of the subcutaneous tissues. Under ultrasound guidance, the jugular vein was accessed with a 21 ga micropuncture needle and an 0.018" wire was inserted to the superior vena cava. Real-time ultrasound guidance was utilized for vascular access including the acquisition of a permanent ultrasound image documenting patency of the accessed vessel. A 5 Fr micopuncture set was then used, through which a 0.035" Rosen wire was passed under fluoroscopic guidance into the inferior vena cava. An 8 Fr dilator was then placed over the wire. A subcutaneous port pocket was then created along the upper chest wall utilizing a combination of sharp and blunt dissection. The pocket was irrigated with sterile saline, packed with gauze, and observed for hemorrhage. A single lumen &quot;standard sized&quot; power  injectable port was chosen for placement. The 8 Fr catheter was tunneled from the port pocket site to the venotomy incision. The port was placed in the pocket. The external catheter was trimmed to appropriate length. The dilator was exchanged for an 8 Fr peel-away sheath under fluoroscopic guidance. The catheter was then placed through the sheath and the sheath was removed. Final catheter positioning was confirmed and documented with a fluoroscopic spot radiograph. The port was accessed with a Huber needle, aspirated, and flushed with heparinized saline. The deep dermal layer of the port pocket incision was closed with interrupted 3-0 Vicryl suture. The skin was opposed with a running subcuticular 4-0 Monocryl suture. Dermabond was then placed over the port pocket and neck incisions. The patient tolerated the procedure well without immediate post procedural complication. FINDINGS: After catheter placement, the tip lies within the superior cavoatrial junction. The catheter aspirates and flushes normally and is ready for immediate use. IMPRESSION: Successful placement of a power injectable Port-A-Cath via the right internal jugular vein. The catheter is ready for immediate use. Ruthann Cancer, MD [facility] Electronically Signed   By: Ruthann Cancer M.D.   On: 08/13/2021 13:50     ASSESSMENT & PLAN: Charlsey Moragne is a 75 y.o. female with    1. 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 Experienced a seizure on 04/24/21 and was found to have a brain metastasis. Staging CT CAP revealed a 4.3 cm LUL suprahilar mass, 8 mm RUL nodule, no acute process or evidence of subdiaphragmatic metastases. -she had craniotomy on 04/30/21 with Dr. Annette Stable, and pathology was consistent with metastatic lung carcinoma. She subsequently received 3 doses of SRS under Dr.  Tammi Klippel. -PET 06/01/21 showed primary LUL lesion, no enlarged or hypermetabolic lymph nodes or other metastatic disease. -she then underwent lobectomy on 06/13/21 with Dr. Roxan Hockey. Pathology showed non-small cell lung cancer, 3.2 cm, margins and all 9 lymph nodes were negative. -restaging brain MRI 07/10/21 showed new 8 mm left parietal metastasis and  enlargement of right frontal lesion. Pending SRS to left parietal lesion by Dr. Tammi Klippel 07/26/21. -Her tumor showed high expression of PD-L1 (TPS 90%), which predicts good response to immunotherapy. Dr. Burr Medico recommended Beryle Flock every 3 weeks for up to 2 years -Her foundation 1 showed K-ras mutation, no other targetable mutation.  Tumor mutation burden was high (20 mut/MB) which again predicts good response to immunotherapy. -Continue palliative care team involvement -began pembrolizumab 07/24/21 -Ms. Pickering appears stable, s/p 1 cycle of pembrolizumab she tolerated well except transaminitis.  She feels her strength has improved. She has significant R chest bruising from port placement  2. Transaminitis  -She had mild ALT elevation intermittently since 2018, but LFTs all normal 07/23/21 when she began pembrolizumab -today AST 124, ALT 388, alk phos 274 with normal Tbili after 1 cycle pembro -she admits to drinking wine daily for past 2-3 weeks for coping, and tylenol use up to 1000 mg for past 4 days -abdominal exam is benign, PET scan 9/22 showed no evidence of abdominopelvic disease and recent KUB showed moderate stool burden. BMs are improving. Will hold on further abdominal imaging for now -Her transaminitis is likely from alcohol and Tylenol use, question some immune-related component given recent start of pembrolizumab -Hold Pembro today, she will receive supportive care hydration.  The plan is to trend LFTs next week, then lab and follow-up in 2 weeks to resume Pembro if transaminitis improves   3. PE, Pneumonia -presented with SOB on  07/03/21 -she has completed a course of antibiotics -started on Eliquis and is now on oxygen as needed. -recent CXR shows stable loculated small to moderate left pleural effusion, no infection   4. Left leg weakness, DVT -present since before initial presentation in 04/2021. -DVT diagnosed on 07/05/21 (not present on doppler 06/05/21)  -strength fluctuates but improving, continue PT and safety measures at home. She did fall recently but no injury -for leg edema I recommend compression stockings   5.Seizure,secondary to brain metastasis  -on dex taper and keppra prophylaxis  -currently on dex 2 mg AM and 1 mg PM -followed by Dr. Mickeal Skinner      PLAN -Labs reviewed, hold pembro for transaminitis -Hydrate 500 cc NS over 2 hours, avoiding overhydration -Avoid alcohol, tylenol -Trend LFTs next week -Lab, f/up, and resume pemrbo in 2 weeks if labs improve -Compression stockings for leg edema -Patient seen with Dr. Burr Medico   All questions were answered. The patient knows to call the clinic with any problems, questions or concerns. No barriers to learning were detected.      Alla Feeling, NP 08/14/21   Addendum  I have seen the patient, examined her. I agree with the assessment and and plan and have edited the notes.   Mrs Deane is clinical stable, but she has developed significant transaminitis with normal bilirubin.  She has no liver metastasis on initial staging PET scan from June 03, 2021.  I suspect her transaminitis is related to [facility]. Will hold treatment today, repeat lab next week, and possible cycle 2 to 2 weeks. If no significant improvement, may consider [name] or CT scan for further evaluation.  Truitt Merle  08/15/2021

## 2021-08-13 ENCOUNTER — Other Ambulatory Visit: Payer: Self-pay

## 2021-08-13 ENCOUNTER — Encounter (HOSPITAL_COMMUNITY): Payer: Self-pay

## 2021-08-13 ENCOUNTER — Inpatient Hospital Stay: Payer: Medicare Other | Admitting: Nurse Practitioner

## 2021-08-13 ENCOUNTER — Ambulatory Visit (HOSPITAL_COMMUNITY)
Admission: RE | Admit: 2021-08-13 | Discharge: 2021-08-13 | Disposition: A | Discharge status: Home / Self Care | Payer: Medicare Other | Source: Ambulatory Visit | Attending: Nurse Practitioner | Admitting: Nurse Practitioner

## 2021-08-13 DIAGNOSIS — F32A Depression, unspecified: Secondary | ICD-10-CM | POA: Insufficient documentation

## 2021-08-13 DIAGNOSIS — Z452 Encounter for adjustment and management of vascular access device: Secondary | ICD-10-CM | POA: Diagnosis not present

## 2021-08-13 DIAGNOSIS — Z87891 Personal history of nicotine dependence: Secondary | ICD-10-CM | POA: Diagnosis not present

## 2021-08-13 DIAGNOSIS — E785 Hyperlipidemia, unspecified: Secondary | ICD-10-CM | POA: Insufficient documentation

## 2021-08-13 DIAGNOSIS — C7931 Secondary malignant neoplasm of brain: Secondary | ICD-10-CM | POA: Insufficient documentation

## 2021-08-13 DIAGNOSIS — Z853 Personal history of malignant neoplasm of breast: Secondary | ICD-10-CM | POA: Diagnosis not present

## 2021-08-13 DIAGNOSIS — G629 Polyneuropathy, unspecified: Secondary | ICD-10-CM | POA: Diagnosis not present

## 2021-08-13 DIAGNOSIS — C3412 Malignant neoplasm of upper lobe, left bronchus or lung: Secondary | ICD-10-CM | POA: Insufficient documentation

## 2021-08-13 DIAGNOSIS — C3490 Malignant neoplasm of unspecified part of unspecified bronchus or lung: Secondary | ICD-10-CM | POA: Diagnosis not present

## 2021-08-13 HISTORY — PX: IR IMAGING GUIDED PORT INSERTION: IMG5740

## 2021-08-13 MED ORDER — MIDAZOLAM HCL 2 MG/2ML IJ SOLN
INTRAMUSCULAR | Status: AC
  Filled 2021-08-13: qty 4

## 2021-08-13 MED ORDER — DIPHENHYDRAMINE HCL 50 MG/ML IJ SOLN
INTRAMUSCULAR | Status: AC
  Filled 2021-08-13: qty 1

## 2021-08-13 MED ORDER — DIPHENHYDRAMINE HCL 50 MG/ML IJ SOLN
INTRAMUSCULAR | Status: AC | PRN
  Administered 2021-08-13: 25 mg via INTRAVENOUS

## 2021-08-13 MED ORDER — MIDAZOLAM HCL 2 MG/2ML IJ SOLN
INTRAMUSCULAR | Status: AC | PRN
  Administered 2021-08-13: 1 mg via INTRAVENOUS

## 2021-08-13 MED ORDER — LIDOCAINE-EPINEPHRINE (PF) 2 %-1:200000 IJ SOLN
INTRAMUSCULAR | Status: AC
  Filled 2021-08-13: qty 20

## 2021-08-13 MED ORDER — SODIUM CHLORIDE 0.9 % IV SOLN: INTRAVENOUS | Status: DC

## 2021-08-13 MED ORDER — HEPARIN SOD (PORK) LOCK FLUSH 100 UNIT/ML IV SOLN
INTRAVENOUS | Status: AC
  Filled 2021-08-13: qty 5

## 2021-08-13 NOTE — Discharge Instructions (Addendum)
Interventional radiology phone numbers °[phone] °After hours [phone] ° ° ° °You have skin glue (dermabond) over your new port. Do not use the lidocaine cream (EMLA cream) over the skin glue until it has healed. The petroleum in the lidocaine cream will dissolve the skin glue resulting in an infection of your new port. Use ice in a zip lock bag for 1-2 minutes over your new port before the cancer center nurses access your port. ° ° °Implanted Port Insertion, Care After °This sheet gives you information about how to care for yourself after your procedure. Your health care provider may also give you more specific instructions. If you have problems or questions, contact your health care provider. °What can I expect after the procedure? °After the procedure, it is common to have: °Discomfort at the port insertion site. °Bruising on the skin over the port. This should improve over 3-4 days. °Follow these instructions at home: °Port care °After your port is placed, you will get a manufacturer's information card. The card has information about your port. Keep this card with you at all times. °Take care of the port as told by your health care provider. Ask your health care provider if you or a family member can get training for taking care of the port at home. A home health care nurse may also take care of the port. °Make sure to remember what type of port you have. °Incision care °Follow instructions from your health care provider about how to take care of your port insertion site. Make sure you: °Wash your hands with soap and water before and after you change your bandage (dressing). If soap and water are not available, use hand sanitizer. °Change your dressing as told by your health care provider. °Leave skin glue in place. These skin closures may need to stay in place for 2 weeks or longer.  °Check your port insertion site every day for signs of infection. Check for: °Redness, swelling, or pain. °Fluid or  blood. °Warmth. °Pus or a bad smell.  °  °  °Activity °Return to your normal activities as told by your health care provider. Ask your health care provider what activities are safe for you. °Do not lift anything that is heavier than 10 lb (4.5 kg), or the limit that you are told, until your health care provider says that it is safe. °General instructions °Take over-the-counter and prescription medicines only as told by your health care provider. °Do not take baths, swim, or use a hot tub until your health care provider approves.You may remove your dressing tomorrow and shower 24 hours after your procedure. °Do not drive for 24 hours if you were given a sedative during your procedure. °Wear a medical alert bracelet in case of an emergency. This will tell any health care providers that you have a port. °Keep all follow-up visits as told by your health care provider. This is important. °Contact a health care provider if: °You cannot flush your port with saline as directed, or you cannot draw blood from the port. °You have a fever or chills. °You have redness, swelling, or pain around your port insertion site. °You have fluid or blood coming from your port insertion site. °Your port insertion site feels warm to the touch. °You have pus or a bad smell coming from the port insertion site. °Get help right away if: °You have chest pain or shortness of breath. °You have bleeding from your port that you cannot control. °Summary °Take care of   the port as told by your health care provider. Keep the manufacturer's information card with you at all times. °Change your dressing as told by your health care provider. °Contact a health care provider if you have a fever or chills or if you have redness, swelling, or pain around your port insertion site. °Keep all follow-up visits as told by your health care provider. °This information is not intended to replace advice given to you by your health care provider. Make sure you discuss any  questions you have with your health care provider. °Document Revised: 04/07/2018 Document Reviewed: 04/07/2018 °Elsevier Patient Education © 2021 [employer] ° ° ° °Moderate Conscious Sedation, Adult, Care After °This sheet gives you information about how to care for yourself after your procedure. Your health care provider may also give you more specific instructions. If you have problems or questions, contact your health care provider. °What can I expect after the procedure? °After the procedure, it is common to have: °Sleepiness for several hours. °Impaired judgment for several hours. °Difficulty with balance. °Vomiting if you eat too soon. °Follow these instructions at home: °For the time period you were told by your health care provider: °Rest. °Do not participate in activities where you could fall or become injured. °Do not drive or use machinery. °Do not drink alcohol. °Do not take sleeping pills or medicines that cause drowsiness. °Do not make important decisions or sign legal documents. °Do not take care of children on your own.  °  °  °Eating and drinking °Follow the diet recommended by your health care provider. °Drink enough fluid to keep your urine pale yellow. °If you vomit: °Drink water, juice, or soup when you can drink without vomiting. °Make sure you have little or no nausea before eating solid foods.   °General instructions °Take over-the-counter and prescription medicines only as told by your health care provider. °Have a responsible adult stay with you for the time you are told. It is important to have someone help care for you until you are awake and alert. °Do not smoke. °Keep all follow-up visits as told by your health care provider. This is important. °Contact a health care provider if: °You are still sleepy or having trouble with balance after 24 hours. °You feel light-headed. °You keep feeling nauseous or you keep vomiting. °You develop a rash. °You have a fever. °You have redness or  swelling around the IV site. °Get help right away if: °You have trouble breathing. °You have new-onset confusion at home. °Summary °After the procedure, it is common to feel sleepy, have impaired judgment, or feel nauseous if you eat too soon. °Rest after you get home. Know the things you should not do after the procedure. °Follow the diet recommended by your health care provider and drink enough fluid to keep your urine pale yellow. °Get help right away if you have trouble breathing or new-onset confusion at home. °This information is not intended to replace advice given to you by your health care provider. Make sure you discuss any questions you have with your health care provider. °Document Revised: 01/07/2020 Document Reviewed: 08/05/2019 °Elsevier Patient Education © 2021 Else[employer] Inc.  °

## 2021-08-13 NOTE — H&P (Signed)
Chief Complaint: Patient was seen in consultation today for Port-A-Cath placement at the request of Tara Murphy  Referring Physician(s): Tara Murphy  Supervising Physician: Tara Murphy  Patient Status: [name] - Out-pt  History of Present Illness: Tara Murphy is a 75 y.o. 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  Patient's left upper lobe lung Murphy diagnosed 05/03/2021.  Tara Murphy has requested Port-A-Cath placement for patient to start immunotherapy.  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[place])    S/p total hip replacement left   Breast Murphy ([place]) 2015   left    Cataract    Cholecystolithiasis    Chronic allergic rhinitis    Depression    Former smoker, stopped smoking in distant past 10/05/2019   Quit 2000; 89 y smoking history   GERD (gastroesophageal reflux disease)    Hepatic steatosis 06/08/2020   Intermittent elevated LFTs and ultrasound 2018   Hyperlipemia    IBS (irritable bowel syndrome) 10/08/2018   Nl colonoscopy and EGD 09/2018   IBS (irritable bowel syndrome)    Lichen plano-pilaris    Lung Murphy ([place])    Major depression, 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    Personal history of radiation therapy 2015   Seizures (Tara Murphy)    Urinary tract infection    hx of frequent     Past Surgical History:  Procedure Laterality Date   APPENDECTOMY     APPLICATION OF CRANIAL NAVIGATION Right 04/30/2021   Procedure: APPLICATION OF CRANIAL NAVIGATION;  Surgeon: Tara Larsson, Murphy;  Location: [place];  Service: Neurosurgery;  Laterality: Right;   BREAST LUMPECTOMY Left 2015   CATARACT EXTRACTION Bilateral    cataracts      CHOLECYSTECTOMY N/A 10/29/2016   Procedure: LAPAROSCOPIC CHOLECYSTECTOMY;  Surgeon: Tara Luna, Murphy;  Location: [place];  Service: General;  Laterality: N/A;   COLONOSCOPY     CRANIOTOMY Right 04/30/2021   Procedure: Right Parietal Craniotomy for tumor with Brain Lab;  Surgeon: Tara Larsson, Murphy;  Location: [place];  Service: Neurosurgery;  Laterality: Right;   EYE SURGERY     INTERCOSTAL NERVE BLOCK Left 06/13/2021   Procedure: INTERCOSTAL NERVE BLOCK;  Surgeon: Tara Nakayama, Murphy;  Location: [place];  Service: Thoracic;  Laterality: Left;   LIPOSUCTION     LYMPH NODE DISSECTION Left 06/13/2021   Procedure: LYMPH NODE DISSECTION;  Surgeon: Tara Nakayama, Murphy;  Location: [place];  Service: Thoracic;  Laterality: Left;   TOTAL HIP ARTHROPLASTY     bilateral hip arthoplasty   TOTAL HIP ARTHROPLASTY Right 12/18/2015   Procedure: RIGHT TOTAL HIP ARTHROPLASTY ANTERIOR APPROACH;  Surgeon: Tara Arabian, Murphy;  Location: [facility];  Service: Orthopedics;  Laterality: Right;   TOTAL KNEE ARTHROPLASTY     TOTAL KNEE ARTHROPLASTY Left 11/25/2016   Procedure: LEFT TOTAL KNEE ARTHROPLASTY;  Surgeon: Tara Arabian, Murphy;  Location: [facility];  Service: Orthopedics;  Laterality: Left;  with abductor block   TUBAL LIGATION     UPPER GASTROINTESTINAL ENDOSCOPY      Allergies: Fluorescein, Contrast media [iodinated diagnostic agents], Hydromorphone hcl, Other, Sulfamethoxazole-trimethoprim, Fentanyl, Tape, and Terbinafine and related  Medications: Prior to Admission  medications   Medication Sig Start Date End Date Taking? Authorizing Provider  apixaban (ELIQUIS) 5 MG TABS tablet Take 1 tablet (5 mg total) by mouth 2 (two) times daily. 08/06/21   Tara Murphy  acetaminophen (TYLENOL) 325 MG tablet Take 1-2 tablets (325-650 mg total) by mouth every 4 (four) hours as needed for mild pain. 06/29/21   Tara Murphy  busPIRone (BUSPAR) 7.5 MG tablet Take 1 tablet (7.5 mg total) by mouth 2 (two) times  daily as needed (anxiety). 05/15/21   Tara Murphy  clobetasol (TEMOVATE) 0.05 % external solution Apply 1 application topically daily as needed (irritation).    Tara Murphy  dexamethasone (DECADRON) 2 MG tablet Take 1 tablet (2 mg total) by mouth 2 (two) times daily with a meal. 07/24/21   Tara Murphy  fluticasone (FLONASE) 50 MCG/ACT nasal spray Place 1 spray into both nostrils daily as needed for allergies or rhinitis.    Tara Murphy  gabapentin (NEURONTIN) 300 MG capsule Take 2 capsules (600 mg total) by mouth at bedtime. 06/29/21   Tara Murphy  levETIRAcetam (KEPPRA) 500 MG tablet Take 500 mg by mouth 2 (two) times daily.    Tara Murphy  melatonin 3 MG TABS tablet Take 1 tablet (3 mg total) by mouth at bedtime. 06/29/21   Tara Murphy  modafinil (PROVIGIL) 100 MG tablet Take 1 tablet (100 mg total) by mouth daily. 08/03/21   Tara Murphy  Omeprazole 20 MG TBDD Take 20 mg by mouth daily as needed (indigestion). OTC    Tara Murphy  rOPINIRole (REQUIP) 4 MG tablet Take 1 tablet (4 mg total) by mouth at bedtime. 05/15/21   Tara Murphy  Vitamin D, Ergocalciferol, (DRISDOL) 1.25 MG (50000 UNIT) CAPS capsule Take 1 capsule (50,000 Units total) by mouth every 7 (seven) days. 08/03/21   Tara Murphy  atorvastatin (LIPITOR) 10 MG tablet Take 1 tablet (10 mg total) by mouth 3 (three) times a week. Patient not taking: No sig reported 06/07/20 04/30/21  Tara Murphy     Family History  Problem Relation Age of Onset   Arthritis Mother    Diabetes Mother    Crohn's disease Mother    Arthritis Father    Lung Murphy Father    Breast Murphy Sister 88   Hyperlipidemia Maternal Aunt    Stomach Murphy Maternal Grandmother    Colitis Neg Hx    Rectal Murphy Neg Hx    Esophageal Murphy Neg Hx     Social History   Socioeconomic History   Marital status: Married    Spouse name: Not  on file   Number of children: 2   Years of education: Not on file   Highest education level: Not on file  Occupational History   Occupation: Retired     Comment: Oceanographer   Tobacco Use   Smoking status: Former    Packs/day: 1.00    Types: Cigarettes    Quit date: 11/22/1991    Years since quitting: 29.7   Smokeless tobacco: Never  Vaping Use   Vaping Use: Never used  Substance and Sexual Activity   Alcohol use: Yes    Alcohol/week: 14.0 standard drinks    Types: 14 Glasses of wine per week    Comment: not lately   Drug use: No   Sexual activity: Not Currently    Birth control/protection: Post-menopausal  Other Topics Concern   Not on file  Social History Narrative   4 grandchildren and 3 step    Enjoys painting, and gardening, and home decorating    Right Handed   Has a [place] Puppy    Lives one story home    Social Determinants of Health   Financial Resource Strain: Low Risk    Difficulty of Paying Living Expenses: Not hard at all  Food Insecurity: No Food Insecurity   Worried About Charity fundraiser in the Last Year: Never true   Arboriculturist in the Last Year: Never true  Transportation Needs: No Transportation Needs   Lack of Transportation (Medical): No   Lack of Transportation (Non-Medical): No  Physical Activity: Not on file  Stress: Not on file  Social Connections: Unknown   Frequency of Communication with Friends and Family: Not on file   Frequency of Social Gatherings with Friends and Family: Not on file   Attends Religious Services: Not on file   Active Member of Clubs or Organizations: Not on file   Attends Archivist Meetings: Not on file   Marital Status: Married     Review of Systems: A 12 point ROS discussed and pertinent positives are indicated in the HPI above.  All other systems are negative.  Review of Systems  Constitutional:  Negative for chills and fever.  HENT:  Negative for nosebleeds.   Eyes:  Negative for  visual disturbance.  Respiratory:  Positive for cough. Negative for shortness of breath.   Cardiovascular:  Negative for chest pain and leg swelling.  Gastrointestinal:  Positive for abdominal pain. Negative for blood in stool, diarrhea, nausea and vomiting.  Genitourinary:  Negative for hematuria.  Neurological:  Negative for dizziness, light-headedness and headaches.   Vital Signs: BP 130/61   Pulse 77   Temp 98.7 F (37.1 C) (Oral)   Resp 16   SpO2 96%   Physical Exam Constitutional:      Appearance: Normal appearance. She is not ill-appearing.  HENT:     Head: Normocephalic and atraumatic.     Mouth/Throat:     Mouth: Mucous membranes are moist.     Pharynx: Oropharynx is clear.  Eyes:     Pupils: Pupils are equal, round, and reactive to light.  Cardiovascular:     Rate and Rhythm: Normal rate and regular rhythm.     Pulses: Normal pulses.     Heart sounds: Normal heart sounds. No murmur heard.   No friction rub. No gallop.  Pulmonary:     Effort: Pulmonary effort is normal. No respiratory distress.     Breath sounds: Normal breath sounds. No stridor. No wheezing, rhonchi or rales.  Abdominal:     General: Bowel sounds are normal. There is no distension.     Palpations: Abdomen is soft.     Tenderness: There is abdominal tenderness.     Comments: LLQ- still tender from surgery  Musculoskeletal:     Right lower leg: Edema present.     Left lower leg: Edema present.  Skin:    General: Skin is warm and dry.  Neurological:     Mental Status: She is alert and oriented to person, place, and time.  Psychiatric:        Mood and Affect: Mood normal.        Behavior: Behavior normal.        Thought Content: Thought content normal.  Judgment: Judgment normal.    Imaging: DG Chest 2 View  Result Date: 07/23/2021 CLINICAL DATA:  Small cell left lung Murphy, left abdominal pain and pleuritic left chest pain EXAM: CHEST - 2 VIEW COMPARISON:  07/03/2021 chest  radiograph. FINDINGS: Surgical clips are seen in the right upper quadrant of the abdomen. stable cardiomediastinal silhouette with normal heart size. No pneumothorax. Loculated small to moderate left pleural effusion, predominantly posterior and basilar, unchanged. No right pleural effusion. No pulmonary edema. No acute consolidative airspace disease. IMPRESSION: Stable loculated small to moderate left pleural effusion. Otherwise no active cardiopulmonary disease. Electronically Signed   By: Ilona Sorrel M.D.   On: 07/23/2021 12:15   MR Brain W Wo Contrast  Result Date: 07/18/2021 CLINICAL DATA:  Metastatic lung Murphy, radiation treatment planning EXAM: MRI HEAD WITHOUT AND WITH CONTRAST TECHNIQUE: Multiplanar, multiecho pulse sequences of the brain and surrounding structures were obtained without and with intravenous contrast. CONTRAST:  41mL GADAVIST GADOBUTROL 1 MMOL/ML IV SOLN COMPARISON:  Brain MRI 07/10/2021 FINDINGS: Brain: Postsurgical changes reflecting right parietal craniotomy are again seen. Mild pachymeningeal thickening underlying the craniotomy site is favored to reflect postsurgical change. A 2.2 cm by 1.8 cm by 2.6 peripherally enhancing lesion in the right frontal lobe is decreased in size, previously measured 2.6 cm x 2.1 cm by 2.8. Intrinsic T1 hyperintensity and SWI signal dropout within the lesion or consistent with blood products. Edema surrounding this lesion is not significantly changed. The 8 mm by 4 mm enhancing lesion in the left parietal lobe is not significantly changed in size. This lesion also demonstrates blood products. Mild edema surrounding this lesion is minimally increased since 07/10/2021. There is a 4 mm rounded enhancing lesion in the left parietal lobe white matter which was not definitely present on the prior study (1100-201) There is no other definite new lesion. There is no evidence of acute infarct. The ventricles are stable in size. There is no midline shift.  Vascular: Normal flow voids. Skull and upper cervical spine: Normal marrow signal. Sinuses/Orbits: The paranasal sinuses are clear. Bilateral lens implants are in place. The globes and orbits are otherwise unremarkable. Other: None. IMPRESSION: 1. Slightly decreased size of the peripherally enhancing lesion in the right frontal lobe at the site of previously resected metastasis. Edema surrounding this lesion is not significantly changed. 2. Unchanged 8 mm hemorrhagic lesion in the left parietal lobe with slightly increased surrounding vasogenic edema. 3. New 4 mm lesion in the left parietal lobe white matter. Electronically Signed   By: Valetta Mole M.D.   On: 07/18/2021 14:36   DG Abd 2 Views  Result Date: 07/23/2021 CLINICAL DATA:  Metastatic lung Murphy, bloating, constipation EXAM: ABDOMEN - 2 VIEW COMPARISON:  None. FINDINGS: Surgical clips are seen in the right upper quadrant of the abdomen. No dilated small bowel loops or air-fluid levels. Large colonic stool volume, most prominent in the right colon. No evidence of pneumatosis or pneumoperitoneum. Partially visualized bilateral total hip arthroplasty. No radiopaque nephrolithiasis. IMPRESSION: Nonobstructive bowel gas pattern. Large colonic stool volume, most prominent in the right colon, suggesting constipation. Electronically Signed   By: Ilona Sorrel M.D.   On: 07/23/2021 12:11    Labs:  CBC: Recent Labs    07/02/21 1649 07/03/21 1437 07/05/21 0216 07/23/21 0907  WBC 8.7 10.1 6.7 31.1*  HGB 10.7* 10.5* 8.9* 12.2  HCT 31.9* 31.5* 27.9* 37.1  PLT 293.0 296 274 520*    COAGS: Recent Labs    06/11/21 1030  INR 1.0  APTT 24    BMP: Recent Labs    07/03/21 1437 07/05/21 0216 07/06/21 0035 07/23/21 0907  NA 136 138 138 136  Murphy 3.8 3.8 3.9 4.0  CL 101 107 108 103  CO2 24 23 22  21*  GLUCOSE 91 111* 111* 240*  BUN 13 16 9 19   CALCIUM 8.9 8.0* 8.1* 8.7*  CREATININE 0.38* 0.64 0.40* 0.69  GFRNONAA >60 >60 >60 >60     LIVER FUNCTION TESTS: Recent Labs    06/21/21 0516 06/25/21 0538 07/02/21 1649 07/23/21 0907  BILITOT 0.8 0.8 0.7 0.4  AST 23 26 25 20   ALT 55* 47* 43* 40  ALKPHOS 69 65 94 108  PROT 5.3* 5.1* 6.0 6.8  ALBUMIN 2.9* 2.8* 3.4* 3.6    TUMOR MARKERS: No results for input(s): AFPTM, CEA, CA199, CHROMGRNA in the last 8760 hours.  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  Patient's left upper lobe lung Murphy diagnosed 05/03/2021.  Tara Murphy has requested Port-A-Cath placement for patient to start immunotherapy.  Pt resting quietly on stretcher. She is is A&O, calm and pleasant. She is in no distress.  Pt states she is NPO per order.  No labs needed for today. VSS.   Risks and benefits of image guided port-a-catheter placement was discussed with the patient including, but not limited to bleeding, infection, pneumothorax, or fibrin sheath development and need for additional procedures.  All of the patient's questions were answered, patient is agreeable to proceed. Consent signed and in chart.   Thank you for this interesting consult.  I greatly enjoyed meeting Tara Murphy and look forward to participating in their care.  A copy of this report was sent to the requesting provider on this date.  Electronically Signed: Tyson Alias, Murphy 08/13/2021, 10:47 AM   I spent a total of 30 minutes in face to face in clinical consultation, greater than 50% of which was counseling/coordinating care for Port-A-Cath placement.

## 2021-08-13 NOTE — Procedures (Signed)
Interventional Radiology Procedure Note ° °Procedure: Single Lumen Power Port Placement   ° °Access:  Right internal jugular vein ° °Findings: Catheter tip positioned at cavoatrial junction. Port is ready for immediate use.  ° °Complications: None ° °EBL: < 10 mL ° °Recommendations:  °- Ok to shower in 24 hours °- Do not submerge for 7 days °- Routine line care  ° ° °Ziaire Hagos, MD ° ° ° °

## 2021-08-14 ENCOUNTER — Encounter: Payer: Self-pay | Admitting: Nurse Practitioner

## 2021-08-14 ENCOUNTER — Telehealth: Payer: Self-pay

## 2021-08-14 ENCOUNTER — Inpatient Hospital Stay (HOSPITAL_BASED_OUTPATIENT_CLINIC_OR_DEPARTMENT_OTHER): Payer: Medicare Other | Admitting: Nurse Practitioner

## 2021-08-14 ENCOUNTER — Inpatient Hospital Stay: Payer: Medicare Other

## 2021-08-14 VITALS — BP 130/62 | HR 78 | Temp 97.9°F

## 2021-08-14 DIAGNOSIS — Z1329 Encounter for screening for other suspected endocrine disorder: Secondary | ICD-10-CM

## 2021-08-14 DIAGNOSIS — F445 Conversion disorder with seizures or convulsions: Secondary | ICD-10-CM | POA: Diagnosis not present

## 2021-08-14 DIAGNOSIS — G893 Neoplasm related pain (acute) (chronic): Secondary | ICD-10-CM | POA: Diagnosis not present

## 2021-08-14 DIAGNOSIS — R531 Weakness: Secondary | ICD-10-CM

## 2021-08-14 DIAGNOSIS — Z5112 Encounter for antineoplastic immunotherapy: Secondary | ICD-10-CM | POA: Diagnosis not present

## 2021-08-14 DIAGNOSIS — C7931 Secondary malignant neoplasm of brain: Secondary | ICD-10-CM | POA: Diagnosis not present

## 2021-08-14 DIAGNOSIS — C3492 Malignant neoplasm of unspecified part of left bronchus or lung: Secondary | ICD-10-CM

## 2021-08-14 DIAGNOSIS — Z515 Encounter for palliative care: Secondary | ICD-10-CM

## 2021-08-14 DIAGNOSIS — R7401 Elevation of levels of liver transaminase levels: Secondary | ICD-10-CM | POA: Diagnosis not present

## 2021-08-14 DIAGNOSIS — C3412 Malignant neoplasm of upper lobe, left bronchus or lung: Secondary | ICD-10-CM

## 2021-08-14 DIAGNOSIS — D8989 Other specified disorders involving the immune mechanism, not elsewhere classified: Secondary | ICD-10-CM

## 2021-08-14 DIAGNOSIS — K5900 Constipation, unspecified: Secondary | ICD-10-CM

## 2021-08-14 LAB — CMP (CANCER CENTER ONLY)
ALT: 388 U/L (ref 0–44)
AST: 124 U/L — ABNORMAL HIGH (ref 15–41)
Albumin: 3.1 g/dL — ABNORMAL LOW (ref 3.5–5.0)
Alkaline Phosphatase: 274 U/L — ABNORMAL HIGH (ref 38–126)
Anion gap: 8 (ref 5–15)
BUN: 13 mg/dL (ref 8–23)
CO2: 23 mmol/L (ref 22–32)
Calcium: 8.3 mg/dL — ABNORMAL LOW (ref 8.9–10.3)
Chloride: 105 mmol/L (ref 98–111)
Creatinine: 0.55 mg/dL (ref 0.44–1.00)
GFR, Estimated: >60 mL/min (ref >60)
Glucose, Bld: 163 mg/dL — ABNORMAL HIGH (ref 70–99)
Potassium: 4.2 mmol/L (ref 3.5–5.1)
Sodium: 136 mmol/L (ref 135–145)
Total Bilirubin: 0.8 mg/dL (ref 0.3–1.2)
Total Protein: 5.5 g/dL — ABNORMAL LOW (ref 6.5–8.1)

## 2021-08-14 LAB — CBC WITH DIFFERENTIAL (CANCER CENTER ONLY)
Abs Immature Granulocytes: 1.28 10*3/uL — ABNORMAL HIGH (ref 0.00–0.07)
Basophils Absolute: 0.1 10*3/uL (ref 0.0–0.1)
Basophils Relative: 0 %
Eosinophils Absolute: 0.1 10*3/uL (ref 0.0–0.5)
Eosinophils Relative: 0 %
HCT: 31.8 % — ABNORMAL LOW (ref 36.0–46.0)
Hemoglobin: 10.1 g/dL — ABNORMAL LOW (ref 12.0–15.0)
Immature Granulocytes: 6 %
Lymphocytes Relative: 5 %
Lymphs Abs: 1.1 10*3/uL (ref 0.7–4.0)
MCH: 29.8 pg (ref 26.0–34.0)
MCHC: 31.8 g/dL (ref 30.0–36.0)
MCV: 93.8 fL (ref 80.0–100.0)
Monocytes Absolute: 0.9 10*3/uL (ref 0.1–1.0)
Monocytes Relative: 4 %
Neutro Abs: 19.4 10*3/uL — ABNORMAL HIGH (ref 1.7–7.7)
Neutrophils Relative %: 85 %
Platelet Count: 118 10*3/uL — ABNORMAL LOW (ref 150–400)
RBC: 3.39 MIL/uL — ABNORMAL LOW (ref 3.87–5.11)
RDW: 16.2 % — ABNORMAL HIGH (ref 11.5–15.5)
Smear Review: NORMAL
WBC Count: 22.8 10*3/uL — ABNORMAL HIGH (ref 4.0–10.5)
nRBC: 0 % (ref 0.0–0.2)

## 2021-08-14 LAB — TSH: TSH: 0.401 u[IU]/mL (ref 0.308–3.960)

## 2021-08-14 MED ORDER — ROPINIROLE HCL 4 MG PO TABS
4.0000 mg | ORAL_TABLET | Freq: Every day | ORAL | 0 refills | Status: AC
Start: 2021-08-14 — End: ?

## 2021-08-14 MED ORDER — SODIUM CHLORIDE 0.9 % IV SOLN: INTRAVENOUS | Status: AC

## 2021-08-14 MED ORDER — LIDOCAINE-PRILOCAINE 2.5-2.5 % EX CREA: 1.0000 "application " | TOPICAL_CREAM | CUTANEOUS | 0 refills | Status: AC | PRN

## 2021-08-14 MED ORDER — BENZONATATE 100 MG PO CAPS: 100.0000 mg | ORAL_CAPSULE | Freq: Three times a day (TID) | ORAL | 0 refills | Status: AC | PRN

## 2021-08-14 NOTE — Telephone Encounter (Signed)
CRITICAL VALUE STICKER  CRITICAL VALUE: ALT [id number]  RECEIVER (on-site recipient of call):Helmuth Recupero P. LPN  DATE & TIME NOTIFIED: 08/14/21 11 45 am  MESSENGER (representative from lab): Rosann Auerbach  MD NOTIFIED: Cira Rue, NP   TIME OF NOTIFICATION: 11:45 am

## 2021-08-14 NOTE — Patient Instructions (Signed)

## 2021-08-14 NOTE — Progress Notes (Signed)
Tara Murphy  [phone] [phone]   Name: Shontell Prosser Date: 08/14/2021 MRN: [id number]  DOB: [birth year]  Patient Care Team: Leamon Arnt, MD as PCP - General (Family Medicine) Loletha Carrow, Kirke Corin, MD as Consulting Physician (Gastroenterology) Ulla Gallo, MD as Consulting Physician (Dermatology) Wylene Simmer, MD as Consulting Physician (Orthopedic Surgery) Alda Berthold, DO as Consulting Physician (Neurology) Pickenpack-Cousar, Carlena Sax, NP as Nurse Practitioner    Tara Murphy is a 75 y.o. 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  Patient recently hospitalized and treated for pneumonia and PE.  Subsequently she was discharged home on Eliquis and oxygen.  Restaging brain MRI on 07/10/2021 showing new 8 mm left parietal metastasis and enlargement of right frontal lesion.  Patient being seen today as post hospital follow-up follow-up for ongoing goals of care and symptom management.  I personally saw patient during recent hospitalization for palliative needs.    SOCIAL HISTORY:     reports that she quit smoking about 29 years ago. Her smoking use included cigarettes. She smoked an average of 1 pack per day. She has never used smokeless tobacco. She reports current alcohol use of about 14.0 standard drinks per week. She reports that she does not use drugs.  ADVANCE DIRECTIVES:  Patient has documented advanced directives on file via [employer]. Documents personally reviewed. Patients daughter, Larene Beach is [place]  CODE STATUS: Full code  PAST MEDICAL HISTORY: Past Medical History:  Diagnosis Date   Allergy    Arthritis    Avascular  necrosis of bone of hip ([place])    S/p total hip replacement left   Breast cancer ([place]) 2015   left    Cataract    Cholecystolithiasis    Chronic allergic rhinitis    Depression    Former smoker, stopped smoking in distant past 10/05/2019   Quit 2000; 84 y smoking history   GERD (gastroesophageal reflux disease)    Hepatic steatosis 06/08/2020   Intermittent elevated LFTs and ultrasound 2018   Hyperlipemia    IBS (irritable bowel syndrome) 10/08/2018   Nl colonoscopy and EGD 09/2018   IBS (irritable bowel syndrome)    Lichen plano-pilaris    Lung cancer ([place])    Major depression, 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    Personal history of radiation therapy 2015   Seizures ([place])    Urinary tract infection    hx of frequent      HEMATOLOGY/ONCOLOGY HISTORY:  Oncology History  Breast cancer of lower-outer quadrant of left female breast ([place]) (Resolved)  10/15/2013 Surgery   Left breast lumpectomy: Well-differentiated IDC grade 1, 1.1 cm with intermediate grade DCIS solid and cribriform, margins negative, no LVID, 0/2 sentinel nodes negative, T1 cN0 M0 stage IA, Oncotype DX score 8, (6% ROR) ER/PR positive HER-2 negative   11/15/2013 - 11/16/2013 Radiation Therapy   MammoSite partial breast radiation   12/17/2013 - 08/29/2015 Anti-estrogen oral therapy   Anastrozole 1 mg daily (severe fatigue, emotional changes, skin dryness), switched to tamoxifen 06/20/2015 (stopped due to depression)   Primary cancer of left upper  lobe of lung ([place])  05/03/2021 Initial Diagnosis   Primary cancer of left upper lobe of lung ([place])   07/24/2021 -  Chemotherapy   Patient is on Treatment Plan : LUNG NSCLC flat dose Pembrolizumab Q21D       ALLERGIES:  is allergic to fluorescein, contrast media [iodinated diagnostic agents], hydromorphone hcl, other,  sulfamethoxazole-trimethoprim, fentanyl, tape, and terbinafine and related.  MEDICATIONS:  Current Outpatient Medications  Medication Sig Dispense Refill   acetaminophen (TYLENOL) 325 MG tablet Take 1-2 tablets (325-650 mg total) by mouth every 4 (four) hours as needed for mild pain.     apixaban (ELIQUIS) 5 MG TABS tablet Take 1 tablet (5 mg total) by mouth 2 (two) times daily. 60 tablet 3   busPIRone (BUSPAR) 7.5 MG tablet Take 1 tablet (7.5 mg total) by mouth 2 (two) times daily as needed (anxiety). 30 tablet 0   clobetasol (TEMOVATE) 0.05 % external solution Apply 1 application topically daily as needed (irritation).     dexamethasone (DECADRON) 2 MG tablet Take 1 tablet (2 mg total) by mouth 2 (two) times daily with a meal.     fluticasone (FLONASE) 50 MCG/ACT nasal spray Place 1 spray into both nostrils daily as needed for allergies or rhinitis.     gabapentin (NEURONTIN) 300 MG capsule Take 2 capsules (600 mg total) by mouth at bedtime.     levETIRAcetam (KEPPRA) 500 MG tablet Take 500 mg by mouth 2 (two) times daily.     melatonin 3 MG TABS tablet Take 1 tablet (3 mg total) by mouth at bedtime. 30 tablet 0   modafinil (PROVIGIL) 100 MG tablet Take 1 tablet (100 mg total) by mouth daily. 30 tablet 3   Omeprazole 20 MG TBDD Take 20 mg by mouth daily as needed (indigestion). OTC     rOPINIRole (REQUIP) 4 MG tablet Take 1 tablet (4 mg total) by mouth at bedtime. 30 tablet 0   Vitamin D, Ergocalciferol, (DRISDOL) 1.25 MG (50000 UNIT) CAPS capsule Take 1 capsule (50,000 Units total) by mouth every 7 (seven) days. 7 capsule 0   No current facility-administered medications for this visit.    VITAL SIGNS: BP 130/62   Pulse 78   Temp 97.9 F (36.6 C)   SpO2 100%  Filed Weights    Estimated body mass index is 32.6 kg/m as calculated from the following:   Height as of 07/24/21: 5' 6"  (1.676 m).   Weight as of 07/24/21: 202 lb (91.6 kg).  LABS: CBC:    Component Value Date/Time   WBC  22.8 (H) 08/14/2021 1044   WBC 6.7 07/05/2021 0216   HGB 10.1 (L) 08/14/2021 1044   HCT 31.8 (L) 08/14/2021 1044   PLT 118 (L) 08/14/2021 1044   MCV 93.8 08/14/2021 1044   NEUTROABS 19.4 (H) 08/14/2021 1044   LYMPHSABS 1.1 08/14/2021 1044   MONOABS 0.9 08/14/2021 1044   EOSABS 0.1 08/14/2021 1044   BASOSABS 0.1 08/14/2021 1044   Comprehensive Metabolic Panel:    Component Value Date/Time   NA 136 07/23/2021 0907   K 4.0 07/23/2021 0907   CL 103 07/23/2021 0907   CO2 21 (L) 07/23/2021 0907   BUN 19 07/23/2021 0907   CREATININE 0.69 07/23/2021 0907   CREATININE 0.69 06/07/2020 1410   GLUCOSE 240 (H) 07/23/2021 0907   CALCIUM 8.7 (L) 07/23/2021 0907   AST 20 07/23/2021 0907   ALT 40 07/23/2021 0907   ALKPHOS 108 07/23/2021 0907   BILITOT 0.4 07/23/2021 7341  PROT 6.8 07/23/2021 0907   ALBUMIN 3.6 07/23/2021 0907     PERFORMANCE STATUS (ECOG) : 2 - Symptomatic, <50% confined to bed   Physical Exam General: NAD, in wheelchair  Cardiovascular: regular rate and rhythm Pulmonary: clear ant fields Abdomen: soft, nontender, + bowel sounds Neurological: Weakness but otherwise nonfocal  IMPRESSION:  Mrs. Paige presents to the clinic today with her daughter. Complains of generalized weakness. Denies pain at this time. Appetite is improved however does state it waxes and wanes depending on how she is feeling and if she is having abdominal discomfort. Shares some foods and water does not taste good at times.   We discussed at length nutrition, ways to increase protein and water intake. Recommendations for electrolyte drinks, adding fruit or other additives to water to make more appealing, freezing items in ice tray and added flavored or infused ice chips to water, and small frequent meals. Patient and daughter verbalized understanding.   Patient states she continues to have some emotional fatigue with appointments and her overall health. We discussed at length her functional state  in the home and her family support. She is gong to have a PT evaluation on tomorrow which she and family are hopeful this will provide her with some added support and allow her to regain some strength and sense of independency. She is also concerned about her husband who is having a back injection today, with concerns if this does not work he would require back surgery which will be a challenge as he is her main caregiver in the home.   She reports her constipation is improved. She did start the Miralax however discontinued use as she began to have loose stools. Education provided if she does not have a daily BM to use Miralax as needed. She complains of some abdominal discomfort which she describes as tightening when she eats. She reports she did not tolerate Protonix during hospitalization. Education provided and will start taking omeprazole to see if she has any relief.   She is also irritated with a dry cough. This interrupts her sleep and she has frequent episodes throughout the day. She has not taken anything for cough. Education provided on use of Robitussin and also [employer] If no improvement will contact office.   I discussed the importance of continued conversation with family and their medical providers regarding overall plan of care and treatment options, ensuring decisions are within the context of the patients values and GOCs.  PLAN: -Omeprazole for meal related discomfort -Miralax as needed for bowel regimen -Robitussin and Tessalon Perles for cough -Neosporin and clean area around [place] bandage due to skin irritation  -Patient is due for [facility] today and also to see Regan Rakers, NP. Treatment on hold due to elevated LFT -Discussed emotional distressors  -Encouraged pedialyte, gatorade, infused water, small frequent meals, and increased protein for appetite and oral hydration support -Patient understands it is ok to take frequent rest breaks and not over exert self when feeling  fatigued. -Will plan to see back in the  clinic in 1-2 weeks in collaboration with Oncology appointments.    Patient expressed understanding and was in agreement with this plan. She also understands that She can call the clinic at any time with any questions, concerns, or complaints.     Time Total: 40 min.   Visit consisted of counseling and education dealing with the complex and emotionally intense issues of symptom management and palliative care in the setting of serious and potentially life-threatening  illness.Greater than 50%  of this time was spent counseling and coordinating care related to the above assessment and plan.  Signed by: Alda Lea, AGPCNP-BC Palliative Medicine Team

## 2021-08-14 NOTE — Therapy (Signed)
OUTPATIENT PHYSICAL THERAPY THORACOLUMBAR EVALUATION   Patient Name: Tara Murphy MRN: [id number] DOB:09/11/1946, 75 y.o., female Today's Date: 08/15/2021   PT End of Session - 08/15/21 1625     Visit Number 1    Number of Visits 21    Date for PT Re-Evaluation 11/13/21    Authorization Type Medicare    PT Start Time 1515    PT Stop Time 1600    PT Time Calculation (min) 45 min    Activity Tolerance Patient tolerated treatment well;Patient limited by fatigue    Behavior During Therapy [facility] for tasks assessed/performed             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[place])    S/p total hip replacement left   Breast cancer ([place]) 2015   left    Cataract    Cholecystolithiasis    Chronic allergic rhinitis    Depression    Former smoker, stopped smoking in distant past 10/05/2019   Quit 2000; 28 y smoking history   GERD (gastroesophageal reflux disease)    Hepatic steatosis 06/08/2020   Intermittent elevated LFTs and ultrasound 2018   Hyperlipemia    IBS (irritable bowel syndrome) 10/08/2018   Nl colonoscopy and EGD 09/2018   IBS (irritable bowel syndrome)    Lichen plano-pilaris    Lung cancer ([place])    Major depression, 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    Personal history of radiation therapy 2015   Seizures ([place])    Urinary tract infection    hx of frequent    Past Surgical History:  Procedure Laterality Date   APPENDECTOMY     APPLICATION OF CRANIAL NAVIGATION Right 04/30/2021   Procedure: APPLICATION OF CRANIAL NAVIGATION;  Surgeon: Earnie Larsson, MD;  Location: [facility];  Service: Neurosurgery;  Laterality: Right;   BREAST LUMPECTOMY Left 2015   CATARACT EXTRACTION Bilateral    cataracts     CHOLECYSTECTOMY N/A 10/29/2016   Procedure: LAPAROSCOPIC  CHOLECYSTECTOMY;  Surgeon: Erroll Luna, MD;  Location: [place];  Service: General;  Laterality: N/A;   COLONOSCOPY     CRANIOTOMY Right 04/30/2021   Procedure: Right Parietal Craniotomy for tumor with Brain Lab;  Surgeon: Earnie Larsson, MD;  Location: [place];  Service: Neurosurgery;  Laterality: Right;   EYE SURGERY     INTERCOSTAL NERVE BLOCK Left 06/13/2021   Procedure: INTERCOSTAL NERVE BLOCK;  Surgeon: Melrose Nakayama, MD;  Location: [facility];  Service: Thoracic;  Laterality: Left;   IR IMAGING GUIDED PORT INSERTION  08/13/2021   LIPOSUCTION     LYMPH NODE DISSECTION Left 06/13/2021   Procedure: LYMPH NODE DISSECTION;  Surgeon: Melrose Nakayama, MD;  Location: [facility];  Service: Thoracic;  Laterality: Left;   TOTAL HIP ARTHROPLASTY     bilateral hip arthoplasty   TOTAL HIP ARTHROPLASTY Right 12/18/2015   Procedure: RIGHT TOTAL HIP ARTHROPLASTY ANTERIOR APPROACH;  Surgeon: Gaynelle Arabian, MD;  Location: [facility];  Service: Orthopedics;  Laterality: Right;   TOTAL KNEE ARTHROPLASTY     TOTAL KNEE ARTHROPLASTY Left 11/25/2016   Procedure: LEFT TOTAL KNEE ARTHROPLASTY;  Surgeon: Gaynelle Arabian, MD;  Location: [facility];  Service: Orthopedics;  Laterality: Left;  with abductor block   TUBAL LIGATION  UPPER GASTROINTESTINAL ENDOSCOPY     Patient Active Problem List   Diagnosis Date Noted   Pulmonary embolism ([place]) 07/03/2021   S/P partial lobectomy of lung    Sleep disturbance    Post-operative pain    Brain metastasis ([facility])    S/P lobectomy of lung 06/13/2021   Adjustment disorder    Cognitive impairment    Leukocytosis    Transaminitis    Postoperative pain    Left foot drop    Primary cancer of left upper lobe of lung ([place]) 05/03/2021   Brain mass 04/25/2021   Seizure ([place]) 04/24/2021   Pain of left hip joint 10/30/2020   Hepatic steatosis 06/08/2020   Former smoker, stopped smoking in distant past 10/05/2019   Osteopenia 09/07/2019   IBS (irritable bowel syndrome) 10/08/2018    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   Hammer toe 04/13/2018   Osteoarthritis of right foot 04/13/2018   GERD (gastroesophageal reflux disease) 11/20/2017   Familial hyperlipidemia 32/35/5732   Lichen plano-pilaris 11/20/2017   Restless leg syndrome 11/20/2017   Essential tremor 09/26/2016   OA (osteoarthritis) of knee 12/18/2015   OA (osteoarthritis) of hip 12/18/2015   Osteoarthritis of spine with radiculopathy, 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   History of Clostridium difficile colitis 06/16/2015   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, s/p THR 06/13/2015   Chronic allergic rhinitis 06/13/2015   Neuropathy, peripheral, idiopathic 06/13/2015   Recurrent urinary tract infection 06/13/2015   History of left breast cancer 03/20/2015    PCP: Leamon Arnt, MD  REFERRING PROVIDER: Izora Ribas, MD  REFERRING DIAG: C79.31 (ICD-10-CM) - Brain metastasis ([place])  THERAPY DIAG:  Muscle weakness (generalized)  Difficulty in walking, not elsewhere classified  Dizziness and giddiness  ONSET DATE: 2022   SUBJECTIVE:                                                                                                                                                                                           SUBJECTIVE STATEMENT:  Pt does not walk at baseline. She states she used to use walker but kept falling with it. She reports tripping while using the walker. She is currently uses a rollator for walking. Pt states reports 1 fall last week and 3 falls prior to that. She states falls were not results of seizures but from loss of balance/stumbling. She states the foot is very weak but it is much better than it used to be. She reports the entire L side being very weak. When she is fatigued, it is difficult for her stand up. If she is on an elevated  surface, she does not require help. She has to used raised ptoilet seat to get up. Transfers into bed require help.  She does not particularly complain of pain. She will feel short of breath due to lobectomy. She reports that bathing is the most tiring. She states she gets up for ADL and IADL but does not do a lot of walking within the home. She estimates 36mins of total walking in a day. She has baseline dizziness due to level of medication.   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  Patient recently hospitalized and treated for pneumonia and PE - per last MD note  Bilateral hip and knee replacement  PAIN:  Are you having pain? No   PRECAUTIONS: Fall  WEIGHT BEARING RESTRICTIONS No  FALLS:  Has patient fallen in last 6 months? Yes, Number of falls: 3+  LIVING ENVIRONMENT: Lives with: lives with their family Lives in: House/apartment Stairs: No Has following equipment at home: Environmental consultant - 2 wheeled, Environmental consultant - 4 wheeled, Electronics engineer, and Grab bars   PLOF: Requires assistive device for independence, Needs assistance with ADLs, and Needs assistance with homemaking  PATIENT GOALS : Improve balance, prevent falls, get around the home easier   OBJECTIVE:   Pt presents with husband for assistance and transport chair in order to enter clinic.    PATIENT SURVEYS:  FOTO 42 45 at D/C 19 pts MCII    COGNITION:  Overall cognitive status: Within functional limits for tasks assessed     SENSATION:  Light touch: Deficits neuropathy   POSTURE:  Fwd trunk flexion in standing, R sided trunk lean in seated, L toe out and hip ER in standing  LE AROM/PROM:  A/PROM Right 08/15/2021 Left 08/15/2021  Hip flexion 110 90  Hip extension 0 -10  Knee flexion WFL 110  Knee extension WFL Unable to perform against gravity   (Blank rows = not  tested)  LE MMT:  MMT Right 08/15/2021 Left 08/15/2021  Hip flexion 4/5 3-/5  Hip extension    Hip abduction 4/5 3-/5  Hip adduction 4/5 3-/5  Hip internal rotation    Hip external rotation    Knee flexion 4+/5 3-/5  Knee extension 4+/5 3-/5  Ankle dorsiflexion 4/5 3-/5   (Blank rows = not tested)   FUNCTIONAL TESTS:  STS transfer: requires UE support to stand, L LE requires UE support for positioning, quad dominant, excessive fwd trunk lean Required edu for sequencing, technique, safety, and maintaining COG within BOS  Pt with significant SOB and report of fatigue with extended standing and balance activity.  Balance test within walker: able to perform marching, weight shifting   NBOS 10s Semi-tandem 10s Unable to perform SLS on R or L  GAIT: Distance walked: 4ft Assistive device utilized: Environmental consultant - 2 wheeled Level of assistance: CGA Comments: shuffling gait, increased L hip ER, L foot drop, fwd trunk lean    TODAY'S TREATMENT   Exercises Standing March with Counter Support - 2 x daily - 7 x weekly - 2 sets - 10 reps Seated Long Arc Quad - 2 x daily - 7 x weekly - 2 sets - 10 reps Standing Weight Shift Side to Side - 2 x daily - 7 x weekly - 2 sets - 10 reps  PATIENT EDUCATION:  Education details: MOI, diagnosis, prognosis, anatomy, exercise progression, transfer mechanics, safety with HEP, muscle firing,  envelope of function, HEP, POC  Person educated: Patient and Spouse Education method: Explanation, Demonstration, Tactile cues, Verbal cues, and Handouts Education comprehension: verbalized understanding, returned demonstration, verbal cues required, and tactile cues required   HOME EXERCISE PROGRAM: Access Code: [email or web address] URL: [email or web address] Date: 08/15/2021 Prepared by: Daleen Bo  ASSESSMENT:  CLINICAL IMPRESSION: Patient is a 75 y.o. female who was seen today for physical therapy evaluation and treatment for cc of repeated  falls, balance deficits, and deconditioning. Pt's s/s are consistent with significant medical history including but not limited to cancer treatments, craniotomy, lobectomy, neuropathy, and significant falls. Pt with significant endurance deficits with functional transfers, and requires cuing for maintaining balance and safety. Due to pt recent portal, aquatic therapy will be placed on hold until pt is cleared to get into pool environment. Due to land based deficits, pt likely to do well in pool therapy before transitioning back to land. Objective impairments include Abnormal gait, cardiopulmonary status limiting activity, decreased activity tolerance, decreased balance, decreased endurance, decreased knowledge of use of DME, decreased mobility, difficulty walking, decreased ROM, decreased strength, dizziness, hypomobility, increased edema, impaired flexibility, impaired tone, postural dysfunction, and obesity. These impairments are limiting patient from cleaning, community activity, driving, meal prep, laundry, yard work, shopping, and ADL/iADL . Personal factors including Age, Fitness, Past/current experiences, Time since onset of injury/illness/exacerbation, and 3+ comorbidities:    are also affecting patient's functional outcome. Patient will benefit from skilled PT to address above impairments and maximize overall function.  REHAB POTENTIAL: Fair    CLINICAL DECISION MAKING: Stable/uncomplicated  EVALUATION COMPLEXITY: Moderate   GOALS:   SHORT TERM GOALS:  STG Name Target Date Goal status  1 Pt will become independent with HEP in order to demonstrate synthesis of PT education.   08/29/2021 INITIAL  2 Pt will be able to demonstrate ability to perform STS transfers indep without cuing in order to demonstrate functional improvement in LE function for self-care and house hold duties.    09/26/2021 INITIAL  3 Pt will score at least 19 pt increase on FOTO to demonstrate functional improvement in MCII  and pt perceived function.    09/26/2021 INITIAL   LONG TERM GOALS:   LTG Name Target Date Goal status  1 Pt  will become independent with final HEP in order to demonstrate synthesis of PT education.   11/07/2021 INITIAL  2 Pt will be able to demonstrate/report ability to walk >5 mins with ModI in order to demonstrate functional improvement and tolerance to exercise and community mobility.   11/07/2021 INITIAL  3 Pt will score >/= 45 on FOTO to demonstrate functional improvement in balance.   11/07/2021 INITIAL  4 Pt will be able to perform 5XSTS in under 12s  in order to demonstrate functional improvement above the cut off score for adults.   11/07/2021 INITIAL   PLAN: PT FREQUENCY: 2x/week  PT DURATION: 12 weeks  PLANNED INTERVENTIONS: Therapeutic exercises, Therapeutic activity, Neuro Muscular re-education, Balance training, Gait training, Patient/Family education, Joint mobilization, Stair training, DME instructions, Aquatic Therapy, Dry Needling, Electrical stimulation, Wheelchair mobility training, Spinal mobilization, Cryotherapy, Moist heat, scar mobilization, Taping, Vasopneumatic device, Traction, Ultrasound, Ionotophoresis 4mg /ml Dexamethasone, and Manual therapy  PLAN FOR NEXT SESSION: review HEP, practice STS transfers, LAQ, bridging, standing/endurance on nu-step  Daleen Bo PT, DPT 08/15/21 5:43 PM

## 2021-08-15 ENCOUNTER — Encounter (HOSPITAL_BASED_OUTPATIENT_CLINIC_OR_DEPARTMENT_OTHER): Payer: Self-pay | Admitting: Physical Therapy

## 2021-08-15 ENCOUNTER — Ambulatory Visit (HOSPITAL_BASED_OUTPATIENT_CLINIC_OR_DEPARTMENT_OTHER): Payer: Medicare Other | Attending: Family Medicine | Admitting: Physical Therapy

## 2021-08-15 ENCOUNTER — Other Ambulatory Visit: Payer: Self-pay

## 2021-08-15 DIAGNOSIS — R262 Difficulty in walking, not elsewhere classified: Secondary | ICD-10-CM | POA: Insufficient documentation

## 2021-08-15 DIAGNOSIS — M6281 Muscle weakness (generalized): Secondary | ICD-10-CM | POA: Insufficient documentation

## 2021-08-15 DIAGNOSIS — C7931 Secondary malignant neoplasm of brain: Secondary | ICD-10-CM | POA: Insufficient documentation

## 2021-08-15 DIAGNOSIS — R42 Dizziness and giddiness: Secondary | ICD-10-CM | POA: Insufficient documentation

## 2021-08-15 LAB — T4: T4, Total: 7 ug/dL (ref 4.5–12.0)

## 2021-08-17 ENCOUNTER — Telehealth: Payer: Self-pay | Admitting: Hematology

## 2021-08-17 ENCOUNTER — Encounter: Payer: Self-pay | Admitting: Nurse Practitioner

## 2021-08-17 NOTE — Telephone Encounter (Signed)
Hey, Can we make sure she has not had a fall and also no unexplained headaches. She may have strained or bent over and bust a vessel. Avoid any heavy lifting or bending for next 48hrs. Continue meds as usual. Can continue to monitor  however if she develops a headache, dizziness,blurred vision, worsening blood in eye, then would recommend going to ED or Urgent care to be evaluated. Thanks

## 2021-08-17 NOTE — Telephone Encounter (Signed)
Left message with follow-up appointments per 11/22 los.

## 2021-08-19 NOTE — Progress Notes (Signed)
  Radiation Oncology         [phone]) [phone] ________________________________  Name: Tara Murphy MRN: [id number]  Date: 05/31/2021  DOB: [birth year]  End of Treatment Note  Diagnosis:   75 yo woman s/p resection of a solitary right frontal brain metastasis from primary adenocarcinoma of the left upper lung, pending staging PET, potentially oligometastatic disease     Indication for treatment:  Curative       Radiation treatment dates:   9/2, 9/6 and 05/31/21  Site/dose/beams/energy:   Right Frontal 25 mm target was treated using 4 Rapid Arc VMAT Beams to a prescription dose of 9 Gy which will repeated for a total of 3 fractions to 27 Gy.  ExacTrac registration was performed for each couch angle.  The 100% isodose line was prescribed.  6 MV X-rays were delivered in the flattening filter free beam mode.  Narrative: The patient tolerated radiation treatment relatively well.     Plan: The patient has completed radiation treatment. The patient will return to radiation oncology clinic for routine followup in one month. I advised her to call or return sooner if she has any questions or concerns related to her recovery or treatment. ________________________________  Sheral Apley. Tammi Klippel, M.D.

## 2021-08-20 ENCOUNTER — Encounter: Payer: Medicare Other | Admitting: Family Medicine

## 2021-08-20 ENCOUNTER — Other Ambulatory Visit: Payer: Self-pay

## 2021-08-20 ENCOUNTER — Telehealth: Payer: Self-pay

## 2021-08-20 ENCOUNTER — Inpatient Hospital Stay: Payer: Medicare Other

## 2021-08-20 DIAGNOSIS — Z95828 Presence of other vascular implants and grafts: Secondary | ICD-10-CM

## 2021-08-20 DIAGNOSIS — C3492 Malignant neoplasm of unspecified part of left bronchus or lung: Secondary | ICD-10-CM

## 2021-08-20 DIAGNOSIS — C7931 Secondary malignant neoplasm of brain: Secondary | ICD-10-CM | POA: Diagnosis not present

## 2021-08-20 DIAGNOSIS — R7401 Elevation of levels of liver transaminase levels: Secondary | ICD-10-CM | POA: Diagnosis not present

## 2021-08-20 DIAGNOSIS — C3412 Malignant neoplasm of upper lobe, left bronchus or lung: Secondary | ICD-10-CM | POA: Diagnosis not present

## 2021-08-20 DIAGNOSIS — R531 Weakness: Secondary | ICD-10-CM | POA: Diagnosis not present

## 2021-08-20 DIAGNOSIS — F445 Conversion disorder with seizures or convulsions: Secondary | ICD-10-CM | POA: Diagnosis not present

## 2021-08-20 DIAGNOSIS — Z5112 Encounter for antineoplastic immunotherapy: Secondary | ICD-10-CM | POA: Diagnosis not present

## 2021-08-20 LAB — CBC WITH DIFFERENTIAL (CANCER CENTER ONLY)
Abs Immature Granulocytes: 2.42 10*3/uL — ABNORMAL HIGH (ref 0.00–0.07)
Basophils Absolute: 0.1 10*3/uL (ref 0.0–0.1)
Basophils Relative: 0 %
Eosinophils Absolute: 0 10*3/uL (ref 0.0–0.5)
Eosinophils Relative: 0 %
HCT: 30.3 % — ABNORMAL LOW (ref 36.0–46.0)
Hemoglobin: 9.8 g/dL — ABNORMAL LOW (ref 12.0–15.0)
Immature Granulocytes: 8 %
Lymphocytes Relative: 7 %
Lymphs Abs: 2.1 10*3/uL (ref 0.7–4.0)
MCH: 30.2 pg (ref 26.0–34.0)
MCHC: 32.3 g/dL (ref 30.0–36.0)
MCV: 93.5 fL (ref 80.0–100.0)
Monocytes Absolute: 1.2 10*3/uL — ABNORMAL HIGH (ref 0.1–1.0)
Monocytes Relative: 4 %
Neutro Abs: 26.2 10*3/uL — ABNORMAL HIGH (ref 1.7–7.7)
Neutrophils Relative %: 81 %
Platelet Count: 167 10*3/uL (ref 150–400)
RBC: 3.24 MIL/uL — ABNORMAL LOW (ref 3.87–5.11)
RDW: 16.5 % — ABNORMAL HIGH (ref 11.5–15.5)
WBC Count: 32 10*3/uL — ABNORMAL HIGH (ref 4.0–10.5)
nRBC: 0.3 % — ABNORMAL HIGH (ref 0.0–0.2)

## 2021-08-20 LAB — CMP (CANCER CENTER ONLY)
ALT: 380 U/L (ref 0–44)
AST: 144 U/L — ABNORMAL HIGH (ref 15–41)
Albumin: 3 g/dL — ABNORMAL LOW (ref 3.5–5.0)
Alkaline Phosphatase: 462 U/L — ABNORMAL HIGH (ref 38–126)
Anion gap: 13 (ref 5–15)
BUN: 11 mg/dL (ref 8–23)
CO2: 21 mmol/L — ABNORMAL LOW (ref 22–32)
Calcium: 8.2 mg/dL — ABNORMAL LOW (ref 8.9–10.3)
Chloride: 103 mmol/L (ref 98–111)
Creatinine: 0.47 mg/dL (ref 0.44–1.00)
GFR, Estimated: >60 mL/min (ref >60)
Glucose, Bld: 131 mg/dL — ABNORMAL HIGH (ref 70–99)
Potassium: 3.9 mmol/L (ref 3.5–5.1)
Sodium: 137 mmol/L (ref 135–145)
Total Bilirubin: 1.5 mg/dL — ABNORMAL HIGH (ref 0.3–1.2)
Total Protein: 5.6 g/dL — ABNORMAL LOW (ref 6.5–8.1)

## 2021-08-20 MED ORDER — SODIUM CHLORIDE 0.9% FLUSH
10.0000 mL | Freq: Once | INTRAVENOUS | Status: AC
  Administered 2021-08-20: 16:00:00 10 mL

## 2021-08-20 MED ORDER — HEPARIN SOD (PORK) LOCK FLUSH 100 UNIT/ML IV SOLN
500.0000 [IU] | Freq: Once | INTRAVENOUS | Status: AC
  Administered 2021-08-20: 16:00:00 500 [IU]

## 2021-08-20 NOTE — Telephone Encounter (Signed)
CRITICAL VALUE STICKER  CRITICAL VALUE: ALT [phone]  RECEIVER (on-site recipient of call): Annarae Macnair P. LPN  DATE & TIME NOTIFIED: 08/20/21 4:52pm  MESSENGER (representative from lab): Rosann Auerbach   MD NOTIFIED: Cira Rue NP  TIME OF NOTIFICATION: 4:55pm

## 2021-08-21 ENCOUNTER — Other Ambulatory Visit: Payer: Self-pay | Admitting: Nurse Practitioner

## 2021-08-21 ENCOUNTER — Telehealth: Payer: Self-pay

## 2021-08-21 ENCOUNTER — Encounter: Payer: Self-pay | Admitting: Nurse Practitioner

## 2021-08-21 ENCOUNTER — Encounter: Payer: Self-pay | Admitting: Hematology

## 2021-08-21 ENCOUNTER — Ambulatory Visit (HOSPITAL_BASED_OUTPATIENT_CLINIC_OR_DEPARTMENT_OTHER): Payer: Medicare Other | Admitting: Physical Therapy

## 2021-08-21 ENCOUNTER — Encounter (HOSPITAL_BASED_OUTPATIENT_CLINIC_OR_DEPARTMENT_OTHER): Payer: Self-pay | Admitting: Physical Therapy

## 2021-08-21 DIAGNOSIS — R262 Difficulty in walking, not elsewhere classified: Secondary | ICD-10-CM | POA: Diagnosis not present

## 2021-08-21 DIAGNOSIS — R42 Dizziness and giddiness: Secondary | ICD-10-CM | POA: Diagnosis not present

## 2021-08-21 DIAGNOSIS — C3490 Malignant neoplasm of unspecified part of unspecified bronchus or lung: Secondary | ICD-10-CM

## 2021-08-21 DIAGNOSIS — M6281 Muscle weakness (generalized): Secondary | ICD-10-CM | POA: Diagnosis not present

## 2021-08-21 DIAGNOSIS — C7931 Secondary malignant neoplasm of brain: Secondary | ICD-10-CM | POA: Diagnosis not present

## 2021-08-21 NOTE — Telephone Encounter (Signed)
Pt has been scheduled for CT tomorrow 08/22/21 at 1:30pm. I have spoken to the pt and advised of this information. Pt has been advised her arrival time is 1:15pm, NPO 4hrs prior. She will also pick up her oral contrast today. This has been left for her at the front desk.

## 2021-08-21 NOTE — Telephone Encounter (Signed)
I received a call from Tara Murphy daughter asking about her lab results from yesterday. I spoke with Tara Rakers, Tara Murphy and relayed the information back to [facility]. I told her that Tara Murphy's liver enzymes were still elevated but that her bilirubin was also elevated, which wasn't last time. I Murphy that a CTAP was ordered STAT and that they would be contacting Tara Murphy to set up an appt this week. For now, her treatment is on hold until those results come back. She said that Tara Murphy feeling tightness in her abdomen causing difficulty eating/drinking and breathing at times. I notified Nikki, Tara Murphy who advised that this could possibly be fluid, but that this would need to be found with the CTAP. Tara Murphy understood. I did advise that if she had increased work of breathing, she would need to come in. She verbalized understanding. All questions were answered.

## 2021-08-21 NOTE — Therapy (Addendum)
OUTPATIENT PHYSICAL THERAPY TREATMENT NOTE/Discharge   Patient Name: Tara Murphy MRN: [id number] DOB:[birth year], 75 y.o., female Today's Date: 08/21/2021  PCP: Leamon Arnt, MD REFERRING PROVIDER: Izora Ribas, MD   PT End of Session - 08/21/21 1235     Visit Number 2    Number of Visits 21    Date for PT Re-Evaluation 11/13/21    Authorization Type Medicare    PT Start Time 4680    PT Stop Time 1208    PT Time Calculation (min) 23 min    Activity Tolerance Patient tolerated treatment well;Patient limited by fatigue    Behavior During Therapy [facility] for tasks assessed/performed             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[place])    S/p total hip replacement left   Breast cancer ([place]) 2015   left    Cataract    Cholecystolithiasis    Chronic allergic rhinitis    Depression    Former smoker, stopped smoking in distant past 10/05/2019   Quit 2000; 61 y smoking history   GERD (gastroesophageal reflux disease)    Hepatic steatosis 06/08/2020   Intermittent elevated LFTs and ultrasound 2018   Hyperlipemia    IBS (irritable bowel syndrome) 10/08/2018   Nl colonoscopy and EGD 09/2018   IBS (irritable bowel syndrome)    Lichen plano-pilaris    Lung cancer ([place])    Major depression, 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    Personal history of radiation therapy 2015   Seizures ([place])    Urinary tract infection    hx of frequent    Past Surgical History:  Procedure Laterality Date   APPENDECTOMY     APPLICATION OF CRANIAL NAVIGATION Right 04/30/2021   Procedure: APPLICATION OF CRANIAL NAVIGATION;  Surgeon: Earnie Larsson, MD;  Location: [place];  Service: Neurosurgery;  Laterality: Right;   BREAST LUMPECTOMY Left 2015   CATARACT EXTRACTION Bilateral     cataracts     CHOLECYSTECTOMY N/A 10/29/2016   Procedure: LAPAROSCOPIC CHOLECYSTECTOMY;  Surgeon: Erroll Luna, MD;  Location: [place];  Service: General;  Laterality: N/A;   COLONOSCOPY     CRANIOTOMY Right 04/30/2021   Procedure: Right Parietal Craniotomy for tumor with Brain Lab;  Surgeon: Earnie Larsson, MD;  Location: [place];  Service: Neurosurgery;  Laterality: Right;   EYE SURGERY     INTERCOSTAL NERVE BLOCK Left 06/13/2021   Procedure: INTERCOSTAL NERVE BLOCK;  Surgeon: Melrose Nakayama, MD;  Location: [place];  Service: Thoracic;  Laterality: Left;   IR IMAGING GUIDED PORT INSERTION  08/13/2021   LIPOSUCTION     LYMPH NODE DISSECTION Left 06/13/2021   Procedure: LYMPH NODE DISSECTION;  Surgeon: Melrose Nakayama, MD;  Location: [place];  Service: Thoracic;  Laterality: Left;   TOTAL HIP ARTHROPLASTY     bilateral hip arthoplasty   TOTAL HIP ARTHROPLASTY Right 12/18/2015   Procedure: RIGHT TOTAL HIP ARTHROPLASTY ANTERIOR APPROACH;  Surgeon: Gaynelle Arabian, MD;  Location: [facility];  Service: Orthopedics;  Laterality: Right;   TOTAL KNEE ARTHROPLASTY     TOTAL KNEE ARTHROPLASTY Left 11/25/2016   Procedure: LEFT TOTAL KNEE ARTHROPLASTY;  Surgeon: Gaynelle Arabian, MD;  Location: [facility];  Service: Orthopedics;  Laterality:  Left;  with abductor block   TUBAL LIGATION     UPPER GASTROINTESTINAL ENDOSCOPY     Patient Active Problem List   Diagnosis Date Noted   Port-A-Cath in place 08/20/2021   Pulmonary embolism ([place]) 07/03/2021   S/P partial lobectomy of lung    Sleep disturbance    Post-operative pain    Brain metastasis ([facility])    S/P lobectomy of lung 06/13/2021   Adjustment disorder    Cognitive impairment    Leukocytosis    Transaminitis    Postoperative pain    Left foot drop    Primary cancer of left upper lobe of lung ([place]) 05/03/2021   Brain mass 04/25/2021   Seizure ([place]) 04/24/2021   Pain of left hip joint 10/30/2020   Hepatic steatosis 06/08/2020   Former smoker,  stopped smoking in distant past 10/05/2019   Osteopenia 09/07/2019   IBS (irritable bowel syndrome) 10/08/2018   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   Hammer toe 04/13/2018   Osteoarthritis of right foot 04/13/2018   GERD (gastroesophageal reflux disease) 11/20/2017   Familial hyperlipidemia 38/18/2993   Lichen plano-pilaris 11/20/2017   Restless leg syndrome 11/20/2017   Essential tremor 09/26/2016   OA (osteoarthritis) of knee 12/18/2015   OA (osteoarthritis) of hip 12/18/2015   Osteoarthritis of spine with radiculopathy, 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   History of Clostridium difficile colitis 06/16/2015   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, s/p THR 06/13/2015   Chronic allergic rhinitis 06/13/2015   Neuropathy, peripheral, idiopathic 06/13/2015   Recurrent urinary tract infection 06/13/2015   History of left breast cancer 03/20/2015   PCP: Leamon Arnt, MD   REFERRING PROVIDER: Izora Ribas, MD   REFERRING DIAG: C79.31 (ICD-10-CM) - Brain metastasis ([place])   THERAPY DIAG:  Muscle weakness (generalized)   Difficulty in walking, not elsewhere classified   Dizziness and giddiness   ONSET DATE: 2022     SUBJECTIVE:                                                                                                                                                                                            SUBJECTIVE STATEMENT:   Pt does not walk at baseline. She states she used to use walker but kept falling with it. She reports tripping while using the walker. She is currently uses a rollator for walking. Pt states reports 1 fall last week and 3 falls prior to that. She states falls were not results of seizures but from loss of balance/stumbling. She states the foot is very weak but it is much better than it used  to be. She reports the entire L side being very weak. When she is fatigued, it is difficult for her stand up. If she is on an  elevated surface, she does not require help. She has to used raised ptoilet seat to get up. Transfers into bed require help. She does not particularly complain of pain. She will feel short of breath due to lobectomy. She reports that bathing is the most tiring. She states she gets up for ADL and IADL but does not do a lot of walking within the home. She estimates 13mns of total walking in a day. She has baseline dizziness due to level of medication.    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  Patient recently hospitalized and treated for pneumonia and PE - per last MD note   Bilateral hip and knee replacement   PAIN:  Are you having pain? No     PRECAUTIONS: Fall   WEIGHT BEARING RESTRICTIONS No   FALLS:  Has patient fallen in last 6 months? Yes, Number of falls: 3+   LIVING ENVIRONMENT: Lives with: lives with their family Lives in: House/apartment Stairs: No Has following equipment at home: WEnvironmental consultant- 2 wheeled, WEnvironmental consultant- 4 wheeled, SElectronics engineer and Grab bars     PLOF: Requires assistive device for independence, Needs assistance with ADLs, and Needs assistance with homemaking   PATIENT GOALS : Improve balance, prevent falls, get around the home easier     OBJECTIVE:          TODAY'S TREATMENT    11/29 Seated LAQ : left with mod a 3x10 right 3x10  Seated heel raise/toe raise 3x10  Seated ball squeeze 3x10  Seated hip abduction yellow 3x10   After seated heel and toe raise, patient reported significant faituge and requested that session end.   Eval Exercises Standing March with Counter Support - 2 x daily - 7 x weekly - 2 sets - 10 reps Seated Long Arc Quad - 2 x daily - 7 x weekly - 2 sets - 10 reps Standing Weight  Shift Side to Side - 2 x daily - 7 x weekly - 2 sets - 10 reps       PATIENT EDUCATION:  Education details: MOI, diagnosis, prognosis, anatomy, exercise progression, transfer mechanics, safety with HEP, muscle firing,  envelope of function, HEP, POC   Person educated: Patient and Spouse Education method: Explanation, Demonstration, Tactile cues, Verbal cues, and Handouts Education comprehension: verbalized understanding, returned demonstration, verbal cues required, and tactile cues required     HOME EXERCISE PROGRAM: Access Code: [email or web address] URL: [email or web address] Date: 08/15/2021 Prepared by: ADaleen Bo  ASSESSMENT:   CLINICAL IMPRESSION: Patient was limited today. She was short of breath before starting. She requested to at least try some light exercises. She was able to complete light LE exercises with moderate SOB.  After heel/toe raises she reported increased fatigue. She also had a mild cough throughout treatment. She reports she has had this for some time. Her session was halted. She will have her CT tomorrow and we will proceed per MD recommendations.  REHAB POTENTIAL: Fair     CLINICAL DECISION MAKING: Stable/uncomplicated   EVALUATION COMPLEXITY: Moderate     GOALS:     SHORT TERM GOALS:   STG Name  Target Date Goal status  1 Pt will become independent with HEP in order to demonstrate synthesis of PT education.     08/29/2021 INITIAL  2 Pt will be able to demonstrate ability to perform STS transfers indep without cuing in order to demonstrate functional improvement in LE function for self-care and house hold duties.    09/26/2021 INITIAL  3 Pt will score at least 19 pt increase on FOTO to demonstrate functional improvement in MCII and pt perceived function.      09/26/2021 INITIAL    LONG TERM GOALS:    LTG Name Target Date Goal status  1 Pt  will become independent with final HEP in order to demonstrate synthesis of PT education.     11/07/2021  INITIAL  2 Pt will be able to demonstrate/report ability to walk >5 mins with ModI in order to demonstrate functional improvement and tolerance to exercise and community mobility.     11/07/2021 INITIAL  3 Pt will score >/= 45 on FOTO to demonstrate functional improvement in balance.     11/07/2021 INITIAL  4 Pt will be able to perform 5XSTS in under 12s  in order to demonstrate functional improvement above the cut off score for adults.    11/07/2021 INITIAL    PLAN: PT FREQUENCY: 2x/week   PT DURATION: 12 weeks   PLANNED INTERVENTIONS: Therapeutic exercises, Therapeutic activity, Neuro Muscular re-education, Balance training, Gait training, Patient/Family education, Joint mobilization, Stair training, DME instructions, Aquatic Therapy, Dry Needling, Electrical stimulation, Wheelchair mobility training, Spinal mobilization, Cryotherapy, Moist heat, scar mobilization, Taping, Vasopneumatic device, Traction, Ultrasound, Ionotophoresis 61m/ml Dexamethasone, and Manual therapy   PLAN FOR NEXT SESSION: review HEP, practice STS transfers, LAQ, bridging, standing/endurance on nu-step     DCarney LivingPT DPT  08/21/2021, 12:38 PM PHYSICAL THERAPY DISCHARGE SUMMARY  Visits from Start of Care: 2  Current functional level related to goals / functional outcomes: See above   Remaining deficits: See above   Education / Equipment: Anatomy of condition, POC, HEP, exercise form/rationale    Patient agrees to discharge. Patient goals were not met. Patient is being discharged due to the patient's request. Jessica C. Hightower PT, DPT 09/04/21 10:11 AM

## 2021-08-22 ENCOUNTER — Encounter: Payer: Self-pay | Admitting: Hematology

## 2021-08-22 ENCOUNTER — Inpatient Hospital Stay (HOSPITAL_BASED_OUTPATIENT_CLINIC_OR_DEPARTMENT_OTHER): Payer: Medicare Other | Admitting: Nurse Practitioner

## 2021-08-22 ENCOUNTER — Ambulatory Visit (HOSPITAL_COMMUNITY)
Admission: RE | Admit: 2021-08-22 | Discharge: 2021-08-22 | Disposition: A | Discharge status: Home / Self Care | Payer: Medicare Other | Source: Ambulatory Visit | Attending: Nurse Practitioner | Admitting: Nurse Practitioner

## 2021-08-22 ENCOUNTER — Other Ambulatory Visit: Payer: Self-pay

## 2021-08-22 VITALS — BP 140/74 | HR 90 | Temp 98.6°F | Resp 17

## 2021-08-22 DIAGNOSIS — Z515 Encounter for palliative care: Secondary | ICD-10-CM

## 2021-08-22 DIAGNOSIS — R531 Weakness: Secondary | ICD-10-CM

## 2021-08-22 DIAGNOSIS — Z5112 Encounter for antineoplastic immunotherapy: Secondary | ICD-10-CM | POA: Diagnosis not present

## 2021-08-22 DIAGNOSIS — F445 Conversion disorder with seizures or convulsions: Secondary | ICD-10-CM | POA: Diagnosis not present

## 2021-08-22 DIAGNOSIS — K769 Liver disease, unspecified: Secondary | ICD-10-CM | POA: Diagnosis not present

## 2021-08-22 DIAGNOSIS — C3490 Malignant neoplasm of unspecified part of unspecified bronchus or lung: Secondary | ICD-10-CM | POA: Diagnosis not present

## 2021-08-22 DIAGNOSIS — G893 Neoplasm related pain (acute) (chronic): Secondary | ICD-10-CM | POA: Diagnosis not present

## 2021-08-22 DIAGNOSIS — F419 Anxiety disorder, unspecified: Secondary | ICD-10-CM

## 2021-08-22 DIAGNOSIS — C3412 Malignant neoplasm of upper lobe, left bronchus or lung: Secondary | ICD-10-CM | POA: Diagnosis not present

## 2021-08-22 DIAGNOSIS — C7931 Secondary malignant neoplasm of brain: Secondary | ICD-10-CM

## 2021-08-22 DIAGNOSIS — Z7189 Other specified counseling: Secondary | ICD-10-CM

## 2021-08-22 DIAGNOSIS — R7401 Elevation of levels of liver transaminase levels: Secondary | ICD-10-CM | POA: Diagnosis not present

## 2021-08-22 MED ORDER — ONDANSETRON HCL 4 MG PO TABS: 4.0000 mg | ORAL_TABLET | Freq: Three times a day (TID) | ORAL | 0 refills | Status: AC | PRN

## 2021-08-22 MED ORDER — SODIUM CHLORIDE (PF) 0.9 % IJ SOLN
INTRAMUSCULAR | Status: AC
  Filled 2021-08-22: qty 50

## 2021-08-22 MED ORDER — HEPARIN SOD (PORK) LOCK FLUSH 100 UNIT/ML IV SOLN
INTRAVENOUS | Status: AC
  Administered 2021-08-22: 500 [IU]
  Filled 2021-08-22: qty 5

## 2021-08-22 MED ORDER — IOHEXOL 350 MG/ML SOLN
80.0000 mL | Freq: Once | INTRAVENOUS | Status: AC | PRN
  Administered 2021-08-22: 80 mL via INTRAVENOUS

## 2021-08-22 MED ORDER — ALPRAZOLAM 0.25 MG PO TABS: 0.2500 mg | ORAL_TABLET | Freq: Three times a day (TID) | ORAL | 0 refills | Status: AC | PRN

## 2021-08-22 MED ORDER — MORPHINE SULFATE (CONCENTRATE) 20 MG/ML PO SOLN: 10.0000 mg | ORAL | 0 refills | Status: DC | PRN

## 2021-08-22 NOTE — Progress Notes (Addendum)
[name]  [phone] [phone]   Name: Tara Murphy Date: 08/22/2021 MRN: [id number]  DOB: 07/03/1946  Patient Care Team: Leamon Arnt, MD as PCP - General (Family Medicine) Loletha Carrow, Kirke Corin, MD as Consulting Physician (Gastroenterology) Ulla Gallo, MD as Consulting Physician (Dermatology) Wylene Simmer, MD as Consulting Physician (Orthopedic Surgery) Alda Berthold, DO as Consulting Physician (Neurology) Pickenpack-Cousar, Carlena Sax, NP as Nurse Practitioner    Tara Murphy is a 75 y.o. 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  Patient recently hospitalized and treated for pneumonia and PE.  Subsequently she was discharged home on Eliquis and oxygen.  Restaging brain MRI on 07/10/2021 showing new 8 mm left parietal metastasis and enlargement of right frontal lesion.  Patient being seen today as post hospital follow-up follow-up for ongoing goals of care and symptom management.  I personally saw patient during recent hospitalization for palliative needs.     SOCIAL HISTORY:     reports that she quit smoking about 29 years ago. Her smoking use included cigarettes. She smoked an average of 1 pack per day. She has never used smokeless tobacco. She reports current alcohol use of about 14.0 standard drinks per week. She reports that she does not use drugs.  ADVANCE DIRECTIVES:  Patient has advanced directive on file. This was reviewed by myself. MOST form and out of facility DNR form completed on today.   CODE STATUS: DNR  PAST MEDICAL HISTORY: Past Medical History:  Diagnosis Date   Allergy    Arthritis    Avascular necrosis of  bone of hip ([place])    S/p total hip replacement left   Breast cancer ([place]) 2015   left    Cataract    Cholecystolithiasis    Chronic allergic rhinitis    Depression    Former smoker, stopped smoking in distant past 10/05/2019   Quit 2000; 37 y smoking history   GERD (gastroesophageal reflux disease)    Hepatic steatosis 06/08/2020   Intermittent elevated LFTs and ultrasound 2018   Hyperlipemia    IBS (irritable bowel syndrome) 10/08/2018   Nl colonoscopy and EGD 09/2018   IBS (irritable bowel syndrome)    Lichen plano-pilaris    Lung cancer ([place])    Major depression, 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    Personal history of radiation therapy 2015   Seizures ([place])    Urinary tract infection    hx of frequent      HEMATOLOGY/ONCOLOGY HISTORY:  Oncology History  Breast cancer of lower-outer quadrant of left female breast ([place]) (Resolved)  10/15/2013 Surgery   Left breast lumpectomy: Well-differentiated IDC grade 1, 1.1 cm with intermediate grade DCIS solid and cribriform, margins negative, no LVID, 0/2 sentinel nodes negative, T1 cN0 M0 stage IA, Oncotype DX score 8, (6% ROR) ER/PR positive HER-2 negative   11/15/2013 - 11/16/2013 Radiation Therapy   MammoSite partial breast radiation   12/17/2013 - 08/29/2015 Anti-estrogen oral therapy   Anastrozole 1 mg daily (severe fatigue, emotional changes, skin dryness), switched to tamoxifen 06/20/2015 (stopped due to depression)  Primary cancer of left upper lobe of lung ([place])  05/03/2021 Initial Diagnosis   Primary cancer of left upper lobe of lung ([place])   07/24/2021 -  Chemotherapy   Patient is on Treatment Plan : LUNG NSCLC flat dose Pembrolizumab Q21D       ALLERGIES:  is allergic to fluorescein, contrast media [iodinated diagnostic agents], hydromorphone hcl, other,  sulfamethoxazole-trimethoprim, fentanyl, tape, and terbinafine and related.  MEDICATIONS:  Current Outpatient Medications  Medication Sig Dispense Refill   acetaminophen (TYLENOL) 325 MG tablet Take 1-2 tablets (325-650 mg total) by mouth every 4 (four) hours as needed for mild pain. (Patient not taking: Reported on 08/15/2021)     apixaban (ELIQUIS) 5 MG TABS tablet Take 1 tablet (5 mg total) by mouth 2 (two) times daily. 60 tablet 3   benzonatate (TESSALON) 100 MG capsule Take 1 capsule (100 mg total) by mouth 3 (three) times daily as needed for cough. 30 capsule 0   busPIRone (BUSPAR) 7.5 MG tablet Take 1 tablet (7.5 mg total) by mouth 2 (two) times daily as needed (anxiety). 30 tablet 0   clobetasol (TEMOVATE) 0.05 % external solution Apply 1 application topically daily as needed (irritation).     dexamethasone (DECADRON) 2 MG tablet Take 1 tablet (2 mg total) by mouth 2 (two) times daily with a meal.     fluticasone (FLONASE) 50 MCG/ACT nasal spray Place 1 spray into both nostrils daily as needed for allergies or rhinitis.     gabapentin (NEURONTIN) 300 MG capsule Take 2 capsules (600 mg total) by mouth at bedtime.     levETIRAcetam (KEPPRA) 500 MG tablet Take 500 mg by mouth 2 (two) times daily.     lidocaine-prilocaine (EMLA) cream Apply 1 application topically as needed. Apply 30 min prior to appointment. 30 g 0   melatonin 3 MG TABS tablet Take 1 tablet (3 mg total) by mouth at bedtime. 30 tablet 0   modafinil (PROVIGIL) 100 MG tablet Take 1 tablet (100 mg total) by mouth daily. 30 tablet 3   Omeprazole 20 MG TBDD Take 20 mg by mouth daily as needed (indigestion). OTC     rOPINIRole (REQUIP) 4 MG tablet Take 1 tablet (4 mg total) by mouth at bedtime. 30 tablet 0   Vitamin D, Ergocalciferol, (DRISDOL) 1.25 MG (50000 UNIT) CAPS capsule Take 1 capsule (50,000 Units total) by mouth every 7 (seven) days. 7 capsule 0   No current facility-administered medications for this visit.    Facility-Administered Medications Ordered in Other Visits  Medication Dose Route Frequency Provider Last Rate Last Admin   sodium chloride (PF) 0.9 % injection             VITAL SIGNS: BP 140/74 (BP Location: Right Arm, Patient Position: Sitting)   Pulse 90   Temp 98.6 F (37 C)   Resp 17   SpO2 95%  There were no vitals filed for this visit.  Estimated body mass index is 32.6 kg/m as calculated from the following:   Height as of 07/24/21: 5' 6"  (1.676 m).   Weight as of 07/24/21: 202 lb (91.6 kg).  LABS: CBC:    Component Value Date/Time   WBC 32.0 (H) 08/20/2021 1551   WBC 6.7 07/05/2021 0216   HGB 9.8 (L) 08/20/2021 1551   HCT 30.3 (L) 08/20/2021 1551   PLT 167 08/20/2021 1551   MCV 93.5 08/20/2021 1551   NEUTROABS 26.2 (H) 08/20/2021 1551   LYMPHSABS 2.1 08/20/2021 1551   MONOABS 1.2 (  H) 08/20/2021 1551   EOSABS 0.0 08/20/2021 1551   BASOSABS 0.1 08/20/2021 1551   Comprehensive Metabolic Panel:    Component Value Date/Time   NA 137 08/20/2021 1551   K 3.9 08/20/2021 1551   CL 103 08/20/2021 1551   CO2 21 (L) 08/20/2021 1551   BUN 11 08/20/2021 1551   CREATININE 0.47 08/20/2021 1551   CREATININE 0.69 06/07/2020 1410   GLUCOSE 131 (H) 08/20/2021 1551   CALCIUM 8.2 (L) 08/20/2021 1551   AST 144 (H) 08/20/2021 1551   ALT 380 (HH) 08/20/2021 1551   ALKPHOS 462 (H) 08/20/2021 1551   BILITOT 1.5 (H) 08/20/2021 1551   PROT 5.6 (L) 08/20/2021 1551   ALBUMIN 3.0 (L) 08/20/2021 1551     PERFORMANCE STATUS (ECOG) : 3 - Symptomatic, >50% confined to bed   Physical Exam General: NAD, weak appearing  Cardiovascular: regular rate and rhythm Pulmonary: diminished  Abdomen: firm, RUQ tenderness, liver palpable, + bowel sounds Extremities: bilateral lower extremity ankle edema  Skin: scattered bruising  Neurological: Weakness, AAO x3, mood appropriate  IMPRESSION:  Tara Murphy presents to the clinic today post CT of abdomen. She is accompanied by her daughter,  Larene Beach.  Patient is complaining of increased weakness, abdominal tightness and fullness feeling, decreased appetite, and shortness of breath with exertion.  She is in a wheelchair. Weak appearing. Daughter shares patient is usually socializes with everyone and enjoys family time however on Thanksgiving she slept a large portion of the time in a room full of her family which is unusual for her.  Family has noticed a continued decline over the past week including poor nutrition.  Larene Beach reports patient has not eaten more than 10% over the past 48 hours.  Patient endorses lack of appetite and feelings of fullness and discomfort after she eats or drinks.  States "it feels like someone is taking a belt around my waist and squeezing it tight".  Unfortunately patient had a significant change to her CT that was performed today.  Results have been reviewed by Dr. Burr Medico who spoke with patient and her daughter and provided results. We discussed Her current illness and what it means in the larger context of her on-going co-morbidities. Natural disease trajectory and expectations were discussed. They are aware of patient's poor prognosis of weeks -months due to innumerable liver mets and new subcutaneous soft tissue nodules in the anterior chest wall and upper abdominal wall.   Patient and daughter is tearful expressing understanding of her condition.  Patient is requesting to be at home with outpatient hospice support.  They understand we will make a urgent referral to hospice.  Patient would like a hospital bed for support.  I discussed at length hospice's goals and philosophy of care.  Patient and daughter verbalized understanding and appreciation.  Tara Murphy is requesting her daughter Larene Beach be the point of contact.  I discussed with patient her current full CODE STATUS with consideration of her current illness and comorbidities.  Both she and her daughter agreed on DO NOT RESUSCITATE.  Out of facility DNR form  completed and given to patient.  Education provided on MOST form.  Document was completed as requested. The patient and family outlined their wishes for the following treatment decisions:  Cardiopulmonary Resuscitation: Do Not Attempt Resuscitation (DNR/No CPR)  Medical Interventions: Comfort Measures: Keep clean, warm, and dry. Use medication by any route, positioning, wound care, and other measures to relieve pain and suffering. Use oxygen, suction and manual treatment of  airway obstruction as needed for comfort. Do not transfer to the hospital unless comfort needs cannot be met in current location.  Antibiotics: Determine use of limitation of antibiotics when infection occurs  IV Fluids: No IV fluids (provide other measures to ensure comfort)  Feeding Tube: No feeding tube    All questions answered and support provided.   PLAN: Urgent outpatient hospice referral has been placed with [facility]. Patient will need hospital bed.  DNR/MOST form completed and provided to patient  Xanax 0.25 mg as needed for anxiety Roxanol 10 mg as needed for pain/shortness of breath Zofran 4 mg as needed for nausea Patient understands medical team is available to continue ongoing support.  Daughter knows she can contact the office with any future needs.   Patient and daughter expressed understanding and was in agreement with this plan. She also understands that She can call the clinic at any time with any questions, concerns, or complaints.     Time Total: 50 min   Visit consisted of counseling and education dealing with the complex and emotionally intense issues of symptom management and palliative care in the setting of serious and potentially life-threatening illness.Greater than 50%  of this time was spent counseling and coordinating care related to the above assessment and plan.  Signed by: Alda Lea, AGPCNP-BC Palliative Medicine Team    Addendum  I have seen the patient,  examined her. I agree with the assessment and and plan and have edited the notes.   I have personally reviewed her lab and CT scan images from today, which unfortunately showed diffuse liver metastasis and subcutaneous metastasis.  Her liver function has gotten much worse.  Due to her advanced age, poor performance status, liver failure, I do not think she is a candidate for chemotherapy.  I recommend hospice, patient and her daughter are completely in agreement.  We will make a urgent referral today. Emotional support offered.   Truitt Merle  08/23/2021

## 2021-08-23 ENCOUNTER — Encounter: Payer: Self-pay | Admitting: Family Medicine

## 2021-08-23 ENCOUNTER — Telehealth: Payer: Self-pay

## 2021-08-23 DIAGNOSIS — C7989 Secondary malignant neoplasm of other specified sites: Secondary | ICD-10-CM | POA: Diagnosis not present

## 2021-08-23 DIAGNOSIS — Z853 Personal history of malignant neoplasm of breast: Secondary | ICD-10-CM | POA: Diagnosis not present

## 2021-08-23 DIAGNOSIS — K219 Gastro-esophageal reflux disease without esophagitis: Secondary | ICD-10-CM | POA: Diagnosis not present

## 2021-08-23 DIAGNOSIS — K589 Irritable bowel syndrome without diarrhea: Secondary | ICD-10-CM | POA: Diagnosis not present

## 2021-08-23 DIAGNOSIS — G629 Polyneuropathy, unspecified: Secondary | ICD-10-CM | POA: Diagnosis not present

## 2021-08-23 DIAGNOSIS — E785 Hyperlipidemia, unspecified: Secondary | ICD-10-CM | POA: Diagnosis not present

## 2021-08-23 DIAGNOSIS — F329 Major depressive disorder, single episode, unspecified: Secondary | ICD-10-CM | POA: Diagnosis not present

## 2021-08-23 DIAGNOSIS — C3412 Malignant neoplasm of upper lobe, left bronchus or lung: Secondary | ICD-10-CM | POA: Diagnosis not present

## 2021-08-23 DIAGNOSIS — G40909 Epilepsy, unspecified, not intractable, without status epilepticus: Secondary | ICD-10-CM | POA: Diagnosis not present

## 2021-08-23 DIAGNOSIS — G2581 Restless legs syndrome: Secondary | ICD-10-CM | POA: Diagnosis not present

## 2021-08-23 DIAGNOSIS — R1011 Right upper quadrant pain: Secondary | ICD-10-CM | POA: Diagnosis not present

## 2021-08-23 DIAGNOSIS — R609 Edema, unspecified: Secondary | ICD-10-CM | POA: Diagnosis not present

## 2021-08-23 DIAGNOSIS — C787 Secondary malignant neoplasm of liver and intrahepatic bile duct: Secondary | ICD-10-CM | POA: Diagnosis not present

## 2021-08-23 DIAGNOSIS — J302 Other seasonal allergic rhinitis: Secondary | ICD-10-CM | POA: Diagnosis not present

## 2021-08-23 DIAGNOSIS — Z87891 Personal history of nicotine dependence: Secondary | ICD-10-CM | POA: Diagnosis not present

## 2021-08-23 DIAGNOSIS — C7931 Secondary malignant neoplasm of brain: Secondary | ICD-10-CM | POA: Diagnosis not present

## 2021-08-23 MED ORDER — MORPHINE SULFATE (CONCENTRATE) 20 MG/ML PO SOLN
10.0000 mg | ORAL | 0 refills | Status: AC | PRN
Start: 2021-08-23 — End: ?

## 2021-08-23 NOTE — Telephone Encounter (Signed)
Larene Beach, Mrs. Bisceglia's daughter, called this morning to inform me that the pharmacy didn't have the Morphine and wouldn't have it until Friday. She asked if we could switch the pharmacy. I notified Nikki, NP and she switched it over. I called Larene Beach back to make sure they were able to pick up the medicine and she said they were. She also mentioned that Hospice was currently at Mrs. Eberly's house evaluating her. I provided emotional listening and support. I advised her to call us back with any questions/concerns. Understanding verbalized. All questions answered.

## 2021-08-23 NOTE — Addendum Note (Signed)
Addended by: Jimmy Footman on: 08/23/2021 09:42 AM   Modules accepted: Orders

## 2021-08-24 DIAGNOSIS — C787 Secondary malignant neoplasm of liver and intrahepatic bile duct: Secondary | ICD-10-CM | POA: Diagnosis not present

## 2021-08-24 DIAGNOSIS — C3412 Malignant neoplasm of upper lobe, left bronchus or lung: Secondary | ICD-10-CM | POA: Diagnosis not present

## 2021-08-24 DIAGNOSIS — C7931 Secondary malignant neoplasm of brain: Secondary | ICD-10-CM | POA: Diagnosis not present

## 2021-08-24 DIAGNOSIS — R609 Edema, unspecified: Secondary | ICD-10-CM | POA: Diagnosis not present

## 2021-08-24 DIAGNOSIS — R1011 Right upper quadrant pain: Secondary | ICD-10-CM | POA: Diagnosis not present

## 2021-08-24 DIAGNOSIS — C7989 Secondary malignant neoplasm of other specified sites: Secondary | ICD-10-CM | POA: Diagnosis not present

## 2021-08-25 DIAGNOSIS — C787 Secondary malignant neoplasm of liver and intrahepatic bile duct: Secondary | ICD-10-CM | POA: Diagnosis not present

## 2021-08-25 DIAGNOSIS — C7931 Secondary malignant neoplasm of brain: Secondary | ICD-10-CM | POA: Diagnosis not present

## 2021-08-25 DIAGNOSIS — R1011 Right upper quadrant pain: Secondary | ICD-10-CM | POA: Diagnosis not present

## 2021-08-25 DIAGNOSIS — C3412 Malignant neoplasm of upper lobe, left bronchus or lung: Secondary | ICD-10-CM | POA: Diagnosis not present

## 2021-08-25 DIAGNOSIS — R609 Edema, unspecified: Secondary | ICD-10-CM | POA: Diagnosis not present

## 2021-08-25 DIAGNOSIS — C7989 Secondary malignant neoplasm of other specified sites: Secondary | ICD-10-CM | POA: Diagnosis not present

## 2021-08-26 DIAGNOSIS — C3412 Malignant neoplasm of upper lobe, left bronchus or lung: Secondary | ICD-10-CM | POA: Diagnosis not present

## 2021-08-26 DIAGNOSIS — C7989 Secondary malignant neoplasm of other specified sites: Secondary | ICD-10-CM | POA: Diagnosis not present

## 2021-08-26 DIAGNOSIS — R609 Edema, unspecified: Secondary | ICD-10-CM | POA: Diagnosis not present

## 2021-08-26 DIAGNOSIS — C7931 Secondary malignant neoplasm of brain: Secondary | ICD-10-CM | POA: Diagnosis not present

## 2021-08-26 DIAGNOSIS — R1011 Right upper quadrant pain: Secondary | ICD-10-CM | POA: Diagnosis not present

## 2021-08-26 DIAGNOSIS — C787 Secondary malignant neoplasm of liver and intrahepatic bile duct: Secondary | ICD-10-CM | POA: Diagnosis not present

## 2021-08-27 ENCOUNTER — Inpatient Hospital Stay: Payer: Medicare Other

## 2021-08-27 ENCOUNTER — Ambulatory Visit: Payer: Medicare Other | Admitting: Family Medicine

## 2021-08-27 ENCOUNTER — Inpatient Hospital Stay: Payer: Medicare Other | Admitting: Nurse Practitioner

## 2021-08-27 ENCOUNTER — Inpatient Hospital Stay: Payer: Medicare Other | Admitting: Hematology

## 2021-08-27 DIAGNOSIS — C7931 Secondary malignant neoplasm of brain: Secondary | ICD-10-CM | POA: Diagnosis not present

## 2021-08-27 DIAGNOSIS — C7989 Secondary malignant neoplasm of other specified sites: Secondary | ICD-10-CM | POA: Diagnosis not present

## 2021-08-27 DIAGNOSIS — R609 Edema, unspecified: Secondary | ICD-10-CM | POA: Diagnosis not present

## 2021-08-27 DIAGNOSIS — R1011 Right upper quadrant pain: Secondary | ICD-10-CM | POA: Diagnosis not present

## 2021-08-27 DIAGNOSIS — C787 Secondary malignant neoplasm of liver and intrahepatic bile duct: Secondary | ICD-10-CM | POA: Diagnosis not present

## 2021-08-27 DIAGNOSIS — C3412 Malignant neoplasm of upper lobe, left bronchus or lung: Secondary | ICD-10-CM | POA: Diagnosis not present

## 2021-08-28 ENCOUNTER — Encounter: Payer: Self-pay | Admitting: Urology

## 2021-08-28 DIAGNOSIS — C7989 Secondary malignant neoplasm of other specified sites: Secondary | ICD-10-CM | POA: Diagnosis not present

## 2021-08-28 DIAGNOSIS — C3412 Malignant neoplasm of upper lobe, left bronchus or lung: Secondary | ICD-10-CM | POA: Diagnosis not present

## 2021-08-28 DIAGNOSIS — R609 Edema, unspecified: Secondary | ICD-10-CM | POA: Diagnosis not present

## 2021-08-28 DIAGNOSIS — C7931 Secondary malignant neoplasm of brain: Secondary | ICD-10-CM | POA: Diagnosis not present

## 2021-08-28 DIAGNOSIS — R1011 Right upper quadrant pain: Secondary | ICD-10-CM | POA: Diagnosis not present

## 2021-08-28 DIAGNOSIS — C787 Secondary malignant neoplasm of liver and intrahepatic bile duct: Secondary | ICD-10-CM | POA: Diagnosis not present

## 2021-08-28 NOTE — Progress Notes (Signed)
Patient reports abdominal pain 9/10, and fatigue. No other symptoms reported at this time. Patient is currently transitioning to [place]. Meaningful use complete.  Patient notified of 9:00am 08/29/21 telephone appointment and verbalized understanding.  Patient preferred contact # [phone]

## 2021-08-28 NOTE — Progress Notes (Signed)
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  The patient was notified in advance and gave permission to proceed with this visit format.  She tolerated treatment well without any ill side effects.                              On review of systems obtained from her daughter, the patient's only complaint at this time is abdominal pain secondary to progressive metastatic disease in the liver. She had not been eating much at all at home but has just recently settled in at [facility] and is comfortable and eating better. She has all of her family here with her and they are all at peace with her decision to transition to comfort care in light of the significant disease progression noted on recent restaging scans. She is not complaining of headaches or other neurologic complaints.  ALLERGIES:  is allergic to fluorescein, contrast media [iodinated diagnostic agents], hydromorphone hcl, other, sulfamethoxazole-trimethoprim,  fentanyl, tape, and terbinafine and related.  Meds: Current Outpatient Medications  Medication Sig Dispense Refill   acetaminophen (TYLENOL) 325 MG tablet Take 1-2 tablets (325-650 mg total) by mouth every 4 (four) hours as needed for mild pain. (Patient not taking: Reported on 08/15/2021)     ALPRAZolam (XANAX) 0.25 MG tablet Take 1 tablet (0.25 mg total) by mouth 3 (three) times daily as needed for anxiety. 30 tablet 0   apixaban (ELIQUIS) 5 MG TABS tablet Take 1 tablet (5 mg total) by mouth 2 (two) times daily. 60 tablet 3   benzonatate (TESSALON) 100 MG capsule Take 1 capsule (100 mg total) by mouth 3 (three) times daily as needed for cough. 30 capsule 0   busPIRone (BUSPAR) 7.5 MG tablet Take 1 tablet (7.5 mg total) by mouth 2 (two) times daily as needed (anxiety). 30 tablet 0   clobetasol (TEMOVATE) 0.05 % external solution Apply 1 application topically daily as needed (irritation).     dexamethasone (DECADRON) 2 MG tablet Take 1 tablet (2 mg total) by mouth 2 (two) times daily with a meal.     fluticasone (FLONASE) 50 MCG/ACT nasal spray Place 1 spray into both nostrils daily as needed for allergies or rhinitis.     gabapentin (NEURONTIN) 300 MG capsule Take 2 capsules (600 mg total) by mouth at bedtime.     levETIRAcetam (KEPPRA) 500 MG tablet Take 500 mg by mouth 2 (two) times daily.  lidocaine-prilocaine (EMLA) cream Apply 1 application topically as needed. Apply 30 min prior to appointment. 30 g 0   melatonin 3 MG TABS tablet Take 1 tablet (3 mg total) by mouth at bedtime. 30 tablet 0   modafinil (PROVIGIL) 100 MG tablet Take 1 tablet (100 mg total) by mouth daily. 30 tablet 3   morphine (ROXANOL) 20 MG/ML concentrated solution Take 0.5 mLs (10 mg total) by mouth every 2 (two) hours as needed for severe pain, moderate pain, shortness of breath or breakthrough pain. 30 mL 0   Omeprazole 20 MG TBDD Take 20 mg by mouth daily as needed (indigestion). OTC     ondansetron (ZOFRAN) 4 MG  tablet Take 1 tablet (4 mg total) by mouth every 8 (eight) hours as needed for nausea or vomiting. 30 tablet 0   rOPINIRole (REQUIP) 4 MG tablet Take 1 tablet (4 mg total) by mouth at bedtime. 30 tablet 0   Vitamin D, Ergocalciferol, (DRISDOL) 1.25 MG (50000 UNIT) CAPS capsule Take 1 capsule (50,000 Units total) by mouth every 7 (seven) days. (Patient not taking: Reported on 08/28/2021) 7 capsule 0   No current facility-administered medications for this encounter.    Physical Findings:  vitals were not taken for this visit.  Pain Assessment Pain Score: 9  (abdominal)/10 Unable to assess due to telephone follow-up visit format.  Lab Findings: Lab Results  Component Value Date   WBC 32.0 (H) 08/20/2021   HGB 9.8 (L) 08/20/2021   HCT 30.3 (L) 08/20/2021   MCV 93.5 08/20/2021   PLT 167 08/20/2021     Radiographic Findings: CT Abdomen Pelvis W Contrast  Result Date: 08/22/2021 CLINICAL DATA:  Non-small-cell lung Murphy. Rising LFTs. New hyperbilirubinemia. EXAM: CT ABDOMEN AND PELVIS WITH CONTRAST TECHNIQUE: Multidetector CT imaging of the abdomen and pelvis was performed using the standard protocol following bolus administration of intravenous contrast. CONTRAST:  44m OMNIPAQUE IOHEXOL 350 MG/ML SOLN COMPARISON:  CT scan 04/25/2021.  PET-CT 06/01/2021. FINDINGS: Lower chest: New small left pleural effusion. Hepatobiliary: Innumerable new liver metastases are evident involving both hepatic lobes. These are not visible on the previous abdomen/pelvis CT nor on the PET-CT. Patient had CTA Chest of 07/03/2021, also without discernible hepatic metastases at that time. Dominant posterior left hepatic lobe lesion measures 4.1 cm on image 20/2. One of the more dominant right hepatic lobe lesions measures 3.2 cm on image 37/2. A third index lesion in the dome of the liver measures 3.3 cm on image 12/2. No intrahepatic biliary duct dilatation. Gallbladder surgically absent. Common bile duct is  nondilated. Pancreas: No focal mass lesion. No dilatation of the main duct. No intraparenchymal cyst. No peripancreatic edema. Spleen: No splenomegaly. No focal mass lesion. Adrenals/Urinary Tract: No adrenal nodule or mass. Tiny hypoattenuating lesion in the upper pole left kidney is stable since 04/25/2021, likely a cyst. Right kidney unremarkable. No evidence for hydroureter. Bladder is largely obscured by beam hardening artifact from bilateral hip replacement. Stomach/Bowel: Stomach is decompressed. Duodenum is normally positioned as is the ligament of Treitz. Duodenal diverticulum noted. No small bowel wall thickening. No small bowel dilatation. The terminal ileum is normal. No gross colonic mass. No colonic wall thickening. Mild diverticular disease noted left colon without diverticulitis. Vascular/Lymphatic: There is mild atherosclerotic calcification of the abdominal aorta without aneurysm. Portal vein, superior mesenteric vein, and splenic vein are patent. There is no gastrohepatic or hepatoduodenal ligament lymphadenopathy. No retroperitoneal or mesenteric lymphadenopathy. No pelvic sidewall lymphadenopathy. Reproductive: Calcified fibroids noted in the uterus.  There is no adnexal mass. Other: No intraperitoneal free fluid. Musculoskeletal: There is a new 13 mm subcutaneous soft tissue nodule in the anterior right chest wall, just below the inframammary fold. A second new lobular subcutaneous nodule measuring 2.3 cm is identified in the midline anterior chest wall just superficial to the inferior sternum. 9 mm subcutaneous nodule identified anterolateral upper abdominal wall subcutaneous fat. No worrisome lytic or sclerotic osseous abnormality. Status post bilateral hip replacement. Nonacute posterolateral left ninth rib fracture evident. IMPRESSION: 1. Innumerable new liver metastases involving both hepatic lobes measuring up to 4.1 cm. These liver lesions appear to be new since CTA Chest of 07/03/2021.  2. New subcutaneous soft tissue nodules in the anterior chest wall and upper abdominal wall, consistent with metastatic disease. These lesions are also new since 07/03/2021. 3. No intra or extrahepatic biliary duct dilatation. No ascites. No bowel dilatation. 4. New small left pleural effusion. 5. Aortic Atherosclerosis (ICD10-I70.0). Electronically Signed   By: Misty Stanley M.D.   On: 08/22/2021 14:24   IR IMAGING GUIDED PORT INSERTION  Result Date: 08/13/2021 INDICATION: 75 year old female with history of advanced stage lung Murphy requiring central venous access for immunotherapy. EXAM: IMPLANTED PORT A CATH PLACEMENT WITH ULTRASOUND AND FLUOROSCOPIC GUIDANCE COMPARISON:  None. ANESTHESIA/SEDATION: Moderate (conscious) sedation was employed during this procedure. A total of Versed 2 mg was administered intravenously. Moderate Sedation Time: 15 minutes. The patient's level of consciousness and vital signs were monitored continuously by radiology nursing throughout the procedure under my direct supervision. CONTRAST:  None FLUOROSCOPY TIME:  0 minutes, 18 seconds (2 mGy) COMPLICATIONS: None immediate. PROCEDURE: The procedure, risks, benefits, and alternatives were explained to the patient. Questions regarding the procedure were encouraged and answered. The patient understands and consents to the procedure. The right neck and chest were prepped with chlorhexidine in a sterile fashion, and a sterile drape was applied covering the operative field. Maximum barrier sterile technique with sterile gowns and gloves were used for the procedure. A timeout was performed prior to the initiation of the procedure. Ultrasound was used to examine the jugular vein which was compressible and free of internal echoes. A skin marker was used to demarcate the planned venotomy and port pocket incision sites. Local anesthesia was provided to these sites and the subcutaneous tunnel track with 1% lidocaine with 1:100,000 epinephrine.  A small incision was created at the jugular access site and blunt dissection was performed of the subcutaneous tissues. Under ultrasound guidance, the jugular vein was accessed with a 21 ga micropuncture needle and an 0.018" wire was inserted to the superior vena cava. Real-time ultrasound guidance was utilized for vascular access including the acquisition of a permanent ultrasound image documenting patency of the accessed vessel. A 5 Fr micopuncture set was then used, through which a 0.035" Rosen wire was passed under fluoroscopic guidance into the inferior vena cava. An 8 Fr dilator was then placed over the wire. A subcutaneous port pocket was then created along the upper chest wall utilizing a combination of sharp and blunt dissection. The pocket was irrigated with sterile saline, packed with gauze, and observed for hemorrhage. A single lumen &quot;standard sized&quot; power injectable port was chosen for placement. The 8 Fr catheter was tunneled from the port pocket site to the venotomy incision. The port was placed in the pocket. The external catheter was trimmed to appropriate length. The dilator was exchanged for an 8 Fr peel-away sheath under fluoroscopic guidance. The catheter was then placed through the sheath and  the sheath was removed. Final catheter positioning was confirmed and documented with a fluoroscopic spot radiograph. The port was accessed with a Huber needle, aspirated, and flushed with heparinized saline. The deep dermal layer of the port pocket incision was closed with interrupted 3-0 Vicryl suture. The skin was opposed with a running subcuticular 4-0 Monocryl suture. Dermabond was then placed over the port pocket and neck incisions. The patient tolerated the procedure well without immediate post procedural complication. FINDINGS: After catheter placement, the tip lies within the superior cavoatrial junction. The catheter aspirates and flushes normally and is ready for immediate use.  IMPRESSION: Successful placement of a power injectable Port-A-Cath via the right internal jugular vein. The catheter is ready for immediate use. Tara Cancer, MD [facility] Electronically Signed   By: Tara Murphy M.D.   On: 08/13/2021 13:50    Impression/Plan: 23. 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 She appears to have recovered well from the effects of her recent stereotactic radiosurgery to the 2 new brain lesions and is currently without complaints aside from fatigue and abdominal pain which is currently well controlled at [facility]. Her most recent restaging scans show significant disease progression so she has transitioned to Hospice comfort care. Therefore, we will not plan to continue with serial MRI brain scans and will see her back on an as-needed basis.  I shared with the family how much we truly enjoyed taking care of her and being a part of her care team and will continue to follow her progress via correspondence. They know that they are welcome to call at any time with any additional questions or concerns related to radiation.    Nicholos Johns, PA-C

## 2021-08-29 ENCOUNTER — Ambulatory Visit
Admission: RE | Admit: 2021-08-29 | Discharge: 2021-08-29 | Disposition: A | Discharge status: Home / Self Care | Payer: Medicare Other | Source: Ambulatory Visit | Attending: Urology | Admitting: Urology

## 2021-08-29 DIAGNOSIS — C7989 Secondary malignant neoplasm of other specified sites: Secondary | ICD-10-CM | POA: Diagnosis not present

## 2021-08-29 DIAGNOSIS — C7931 Secondary malignant neoplasm of brain: Secondary | ICD-10-CM | POA: Diagnosis not present

## 2021-08-29 DIAGNOSIS — C3412 Malignant neoplasm of upper lobe, left bronchus or lung: Secondary | ICD-10-CM | POA: Diagnosis not present

## 2021-08-29 DIAGNOSIS — R1011 Right upper quadrant pain: Secondary | ICD-10-CM | POA: Diagnosis not present

## 2021-08-29 DIAGNOSIS — R609 Edema, unspecified: Secondary | ICD-10-CM | POA: Diagnosis not present

## 2021-08-29 DIAGNOSIS — C787 Secondary malignant neoplasm of liver and intrahepatic bile duct: Secondary | ICD-10-CM | POA: Diagnosis not present

## 2021-08-30 ENCOUNTER — Encounter (HOSPITAL_BASED_OUTPATIENT_CLINIC_OR_DEPARTMENT_OTHER): Payer: Self-pay | Admitting: Physical Therapy

## 2021-08-30 DIAGNOSIS — C7989 Secondary malignant neoplasm of other specified sites: Secondary | ICD-10-CM | POA: Diagnosis not present

## 2021-08-30 DIAGNOSIS — R609 Edema, unspecified: Secondary | ICD-10-CM | POA: Diagnosis not present

## 2021-08-30 DIAGNOSIS — C3412 Malignant neoplasm of upper lobe, left bronchus or lung: Secondary | ICD-10-CM | POA: Diagnosis not present

## 2021-08-30 DIAGNOSIS — R1011 Right upper quadrant pain: Secondary | ICD-10-CM | POA: Diagnosis not present

## 2021-08-30 DIAGNOSIS — C787 Secondary malignant neoplasm of liver and intrahepatic bile duct: Secondary | ICD-10-CM | POA: Diagnosis not present

## 2021-08-30 DIAGNOSIS — C7931 Secondary malignant neoplasm of brain: Secondary | ICD-10-CM | POA: Diagnosis not present

## 2021-08-31 DIAGNOSIS — R1011 Right upper quadrant pain: Secondary | ICD-10-CM | POA: Diagnosis not present

## 2021-08-31 DIAGNOSIS — C787 Secondary malignant neoplasm of liver and intrahepatic bile duct: Secondary | ICD-10-CM | POA: Diagnosis not present

## 2021-08-31 DIAGNOSIS — C7931 Secondary malignant neoplasm of brain: Secondary | ICD-10-CM | POA: Diagnosis not present

## 2021-08-31 DIAGNOSIS — R609 Edema, unspecified: Secondary | ICD-10-CM | POA: Diagnosis not present

## 2021-08-31 DIAGNOSIS — C7989 Secondary malignant neoplasm of other specified sites: Secondary | ICD-10-CM | POA: Diagnosis not present

## 2021-08-31 DIAGNOSIS — C3412 Malignant neoplasm of upper lobe, left bronchus or lung: Secondary | ICD-10-CM | POA: Diagnosis not present

## 2021-09-01 DIAGNOSIS — C7931 Secondary malignant neoplasm of brain: Secondary | ICD-10-CM | POA: Diagnosis not present

## 2021-09-01 DIAGNOSIS — R609 Edema, unspecified: Secondary | ICD-10-CM | POA: Diagnosis not present

## 2021-09-01 DIAGNOSIS — R1011 Right upper quadrant pain: Secondary | ICD-10-CM | POA: Diagnosis not present

## 2021-09-01 DIAGNOSIS — C787 Secondary malignant neoplasm of liver and intrahepatic bile duct: Secondary | ICD-10-CM | POA: Diagnosis not present

## 2021-09-01 DIAGNOSIS — C7989 Secondary malignant neoplasm of other specified sites: Secondary | ICD-10-CM | POA: Diagnosis not present

## 2021-09-01 DIAGNOSIS — C3412 Malignant neoplasm of upper lobe, left bronchus or lung: Secondary | ICD-10-CM | POA: Diagnosis not present

## 2021-09-02 DIAGNOSIS — C7989 Secondary malignant neoplasm of other specified sites: Secondary | ICD-10-CM | POA: Diagnosis not present

## 2021-09-02 DIAGNOSIS — C3412 Malignant neoplasm of upper lobe, left bronchus or lung: Secondary | ICD-10-CM | POA: Diagnosis not present

## 2021-09-02 DIAGNOSIS — C7931 Secondary malignant neoplasm of brain: Secondary | ICD-10-CM | POA: Diagnosis not present

## 2021-09-02 DIAGNOSIS — R1011 Right upper quadrant pain: Secondary | ICD-10-CM | POA: Diagnosis not present

## 2021-09-02 DIAGNOSIS — C787 Secondary malignant neoplasm of liver and intrahepatic bile duct: Secondary | ICD-10-CM | POA: Diagnosis not present

## 2021-09-02 DIAGNOSIS — R609 Edema, unspecified: Secondary | ICD-10-CM | POA: Diagnosis not present

## 2021-09-03 ENCOUNTER — Ambulatory Visit (HOSPITAL_BASED_OUTPATIENT_CLINIC_OR_DEPARTMENT_OTHER): Payer: Self-pay | Admitting: Physical Therapy

## 2021-09-04 ENCOUNTER — Ambulatory Visit: Payer: Medicare Other | Admitting: Hematology

## 2021-09-04 ENCOUNTER — Ambulatory Visit: Payer: Medicare Other

## 2021-09-04 ENCOUNTER — Other Ambulatory Visit: Payer: Medicare Other

## 2021-09-06 ENCOUNTER — Ambulatory Visit (HOSPITAL_BASED_OUTPATIENT_CLINIC_OR_DEPARTMENT_OTHER): Payer: Self-pay | Admitting: Physical Therapy

## 2021-09-08 ENCOUNTER — Other Ambulatory Visit: Payer: Self-pay

## 2021-09-10 ENCOUNTER — Ambulatory Visit (HOSPITAL_BASED_OUTPATIENT_CLINIC_OR_DEPARTMENT_OTHER): Payer: Self-pay | Admitting: Physical Therapy

## 2021-09-13 ENCOUNTER — Ambulatory Visit (HOSPITAL_BASED_OUTPATIENT_CLINIC_OR_DEPARTMENT_OTHER): Payer: Self-pay | Admitting: Physical Therapy

## 2021-09-18 ENCOUNTER — Encounter (HOSPITAL_BASED_OUTPATIENT_CLINIC_OR_DEPARTMENT_OTHER): Payer: Self-pay | Admitting: Physical Therapy

## 2021-09-18 ENCOUNTER — Other Ambulatory Visit: Payer: Self-pay | Admitting: Physical Medicine and Rehabilitation

## 2021-09-20 ENCOUNTER — Ambulatory Visit (HOSPITAL_BASED_OUTPATIENT_CLINIC_OR_DEPARTMENT_OTHER): Payer: Self-pay | Admitting: Physical Therapy

## 2021-09-23 DEATH — deceased

## 2021-09-25 ENCOUNTER — Ambulatory Visit (HOSPITAL_BASED_OUTPATIENT_CLINIC_OR_DEPARTMENT_OTHER): Payer: Self-pay | Admitting: Physical Therapy

## 2021-09-27 ENCOUNTER — Ambulatory Visit (HOSPITAL_BASED_OUTPATIENT_CLINIC_OR_DEPARTMENT_OTHER): Payer: Self-pay | Admitting: Physical Therapy

## 2021-10-01 ENCOUNTER — Ambulatory Visit (HOSPITAL_BASED_OUTPATIENT_CLINIC_OR_DEPARTMENT_OTHER): Payer: Self-pay | Admitting: Physical Therapy

## 2021-10-04 ENCOUNTER — Ambulatory Visit (HOSPITAL_BASED_OUTPATIENT_CLINIC_OR_DEPARTMENT_OTHER): Payer: Self-pay | Admitting: Physical Therapy

## 2021-10-08 ENCOUNTER — Ambulatory Visit (HOSPITAL_BASED_OUTPATIENT_CLINIC_OR_DEPARTMENT_OTHER): Payer: Self-pay | Admitting: Physical Therapy

## 2021-10-11 ENCOUNTER — Ambulatory Visit (HOSPITAL_BASED_OUTPATIENT_CLINIC_OR_DEPARTMENT_OTHER): Payer: Self-pay | Admitting: Physical Therapy

## 2021-10-15 ENCOUNTER — Ambulatory Visit (HOSPITAL_BASED_OUTPATIENT_CLINIC_OR_DEPARTMENT_OTHER): Payer: Self-pay | Admitting: Physical Therapy

## 2021-10-18 ENCOUNTER — Ambulatory Visit (HOSPITAL_BASED_OUTPATIENT_CLINIC_OR_DEPARTMENT_OTHER): Payer: Self-pay | Admitting: Physical Therapy

## 2021-10-22 ENCOUNTER — Encounter (HOSPITAL_BASED_OUTPATIENT_CLINIC_OR_DEPARTMENT_OTHER): Payer: Self-pay | Admitting: Physical Therapy

## 2021-10-28 NOTE — Progress Notes (Signed)
°  Radiation Oncology         [phone]) [phone] ________________________________  Name: Tara Murphy MRN: [id number]  Date: 07/26/2021  DOB: 05-Feb-1946  End of Treatment Note  Diagnosis:   76 yo woman s/p resection of a solitary right frontal brain metastasis from primary adenocarcinoma of the left upper lung, now with 2 new brain metastases.     Indication for treatment:  Palliation       Radiation treatment dates:   07/26/21  Site/dose/beams/energy:   Both of the left parietal (8 mm, 4 mm) brain mets were  treated using a single isocenter using 6 Rapid Arc VMAT Beams to a prescription dose of 20 Gy.  ExacTrac registration was performed for each couch angle.  The 100% isodose line was prescribed.  6 MV X-rays were delivered in the flattening filter free beam mode.  Narrative: The patient tolerated radiation treatment relatively well.     Plan: The patient has completed radiation treatment. The patient will return to radiation oncology clinic for routine followup in one month. I advised her to call or return sooner if she has any questions or concerns related to her recovery or treatment. ________________________________  Sheral Apley. Tammi Klippel, M.D.

## 2021-11-29 ENCOUNTER — Ambulatory Visit: Payer: Medicare Other | Admitting: Psychology

## 2021-12-13 ENCOUNTER — Ambulatory Visit: Payer: Medicare Other | Admitting: Physical Medicine and Rehabilitation

## 2022-09-19 IMAGING — MR MR HEAD WO/W CM
7 of 13 series · 26 of 48 positions shown · IV contrast (gadavist)
Comparison: None.

CLINICAL DATA: Cancer of unknown primary, staging

EXAM:
MRI HEAD WITHOUT AND WITH CONTRAST
TECHNIQUE: Multiplanar, multiecho pulse sequences of the brain and surrounding
structures were obtained without and with intravenous contrast.
CONTRAST:  10mL GADAVIST GADOBUTROL 1 MMOL/ML IV SOLN

[Series 3: DWI · axial · 3.0mm · 0.94mm/px · z∈[-84,+69]mm · 7 of 106 slices shown (1 of 2)]
[im 1/106]
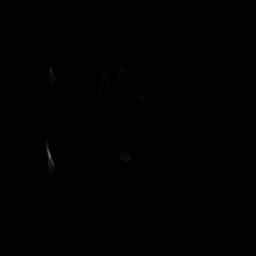
[im 18/106]
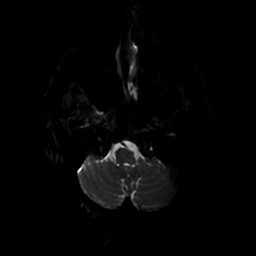
[im 36/106]
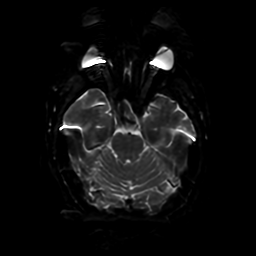
[im 53/106]
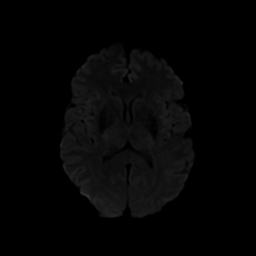
[im 71/106]
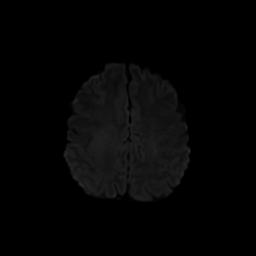
[im 88/106]
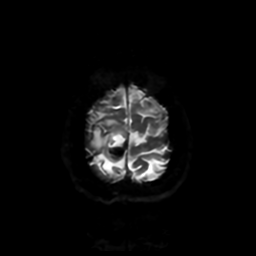
[im 106/106]
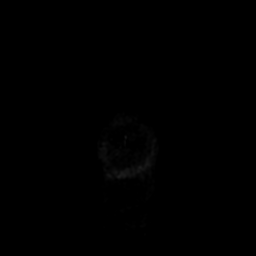

[Series 4: DWI · coronal · 4.0mm · 0.94mm/px · 5 of 76 slices shown (2 of 2)]
[im 1/76]
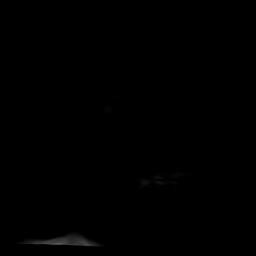
[im 19/76]
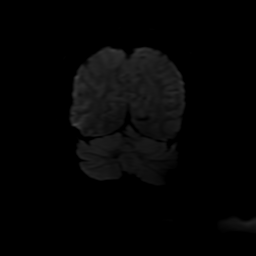
[im 38/76]
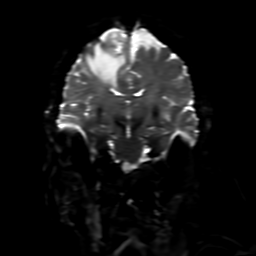
[im 57/76]
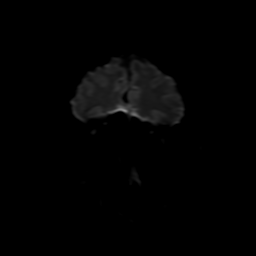
[im 76/76]
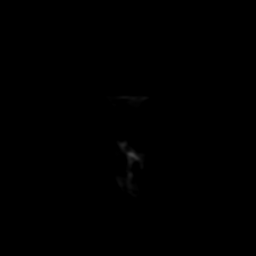

[Series 7: FLAIR · axial · 4.0mm · 0.45mm/px · z∈[-83,+67]mm · 3 of 36 slices shown (1 of 2)]
[im 1/36]
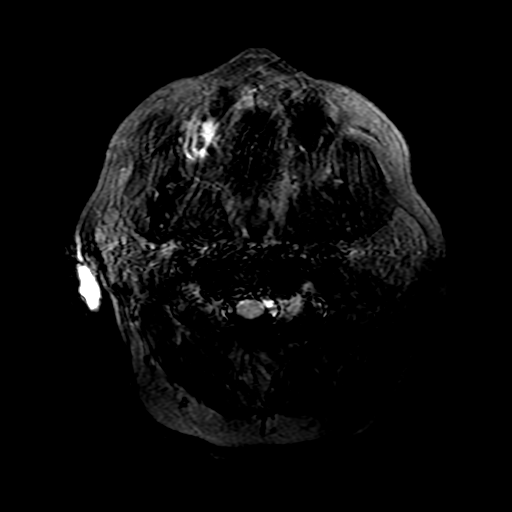
[im 18/36]
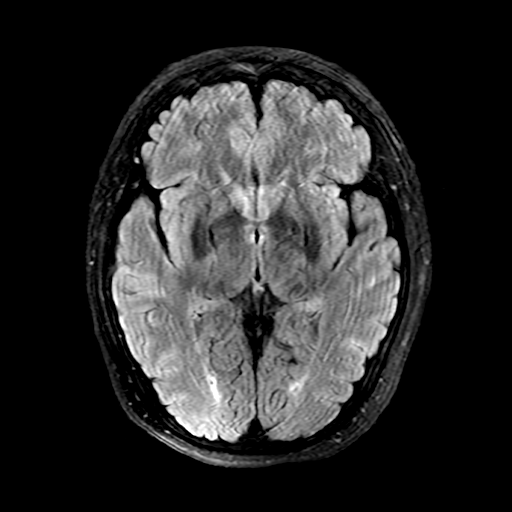
[im 36/36]
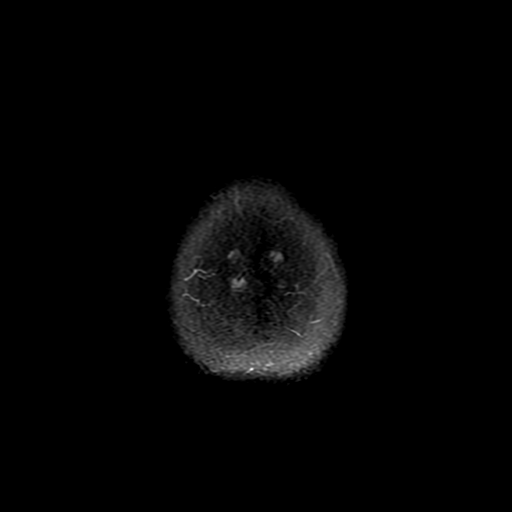

[Series 10: FLAIR · sagittal · 5.0mm · 0.23mm/px · 2 of 25 slices shown (2 of 2)]
[im 1/25]
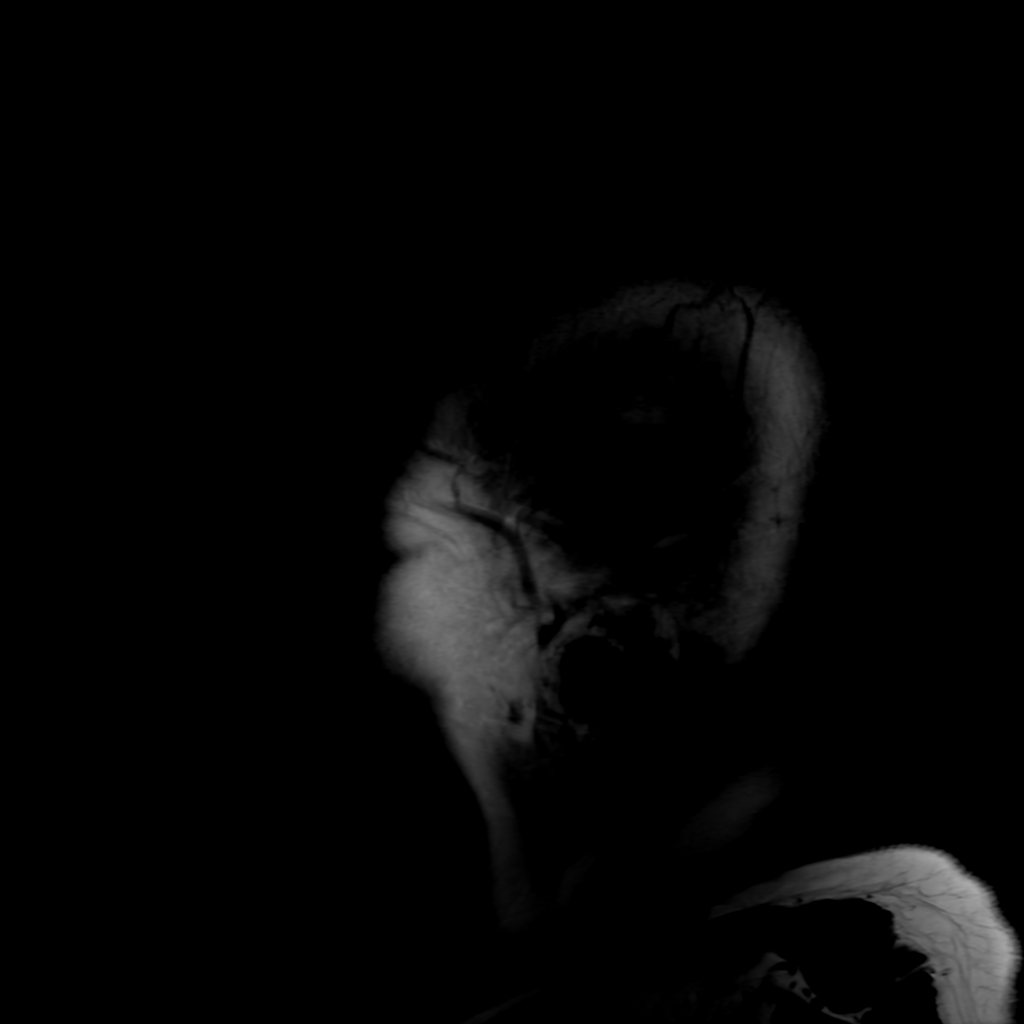
[im 25/25]
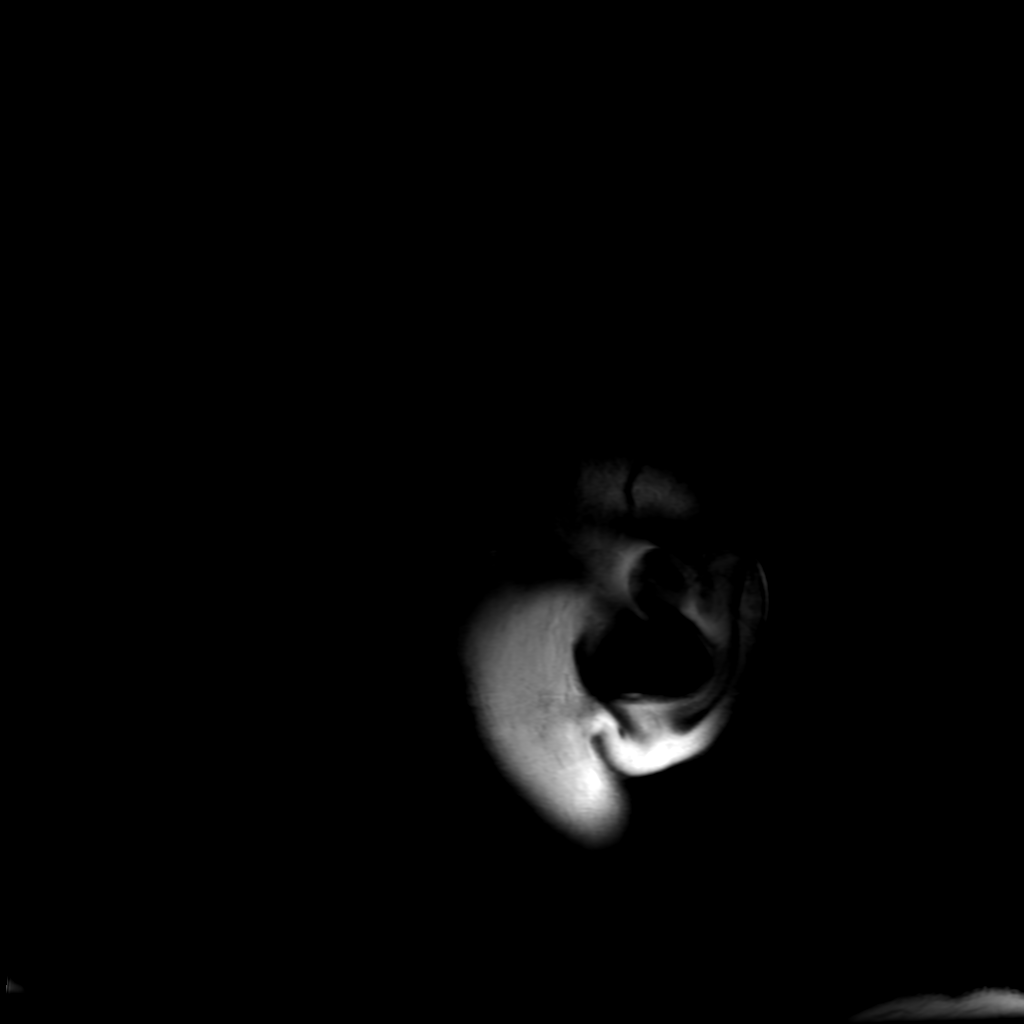

[Series 14: FLAIR post-contrast · sagittal · 5.0mm · 0.47mm/px · 2 of 25 slices shown]
[im 1/25]
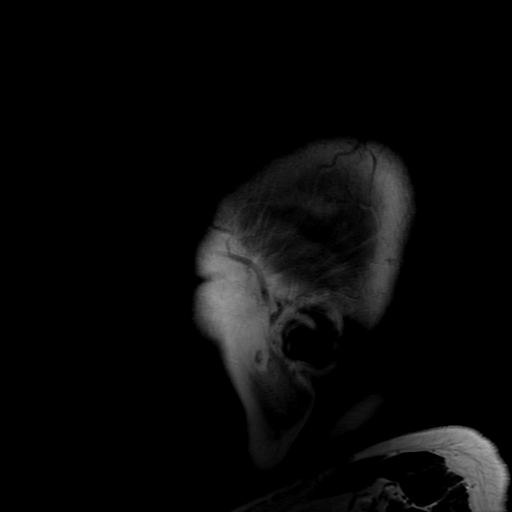
[im 25/25]
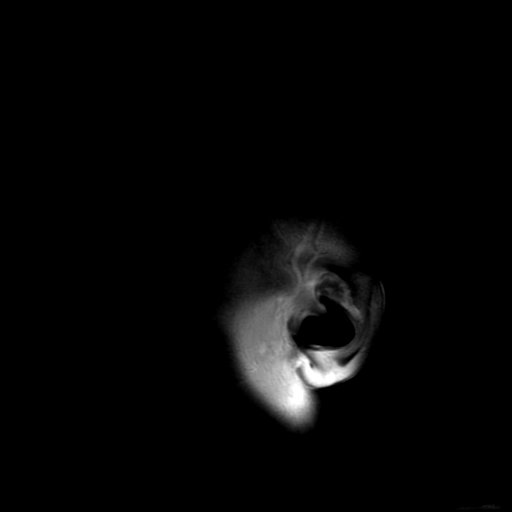

[Series 350: ADC · axial · 3.0mm · 0.94mm/px · z∈[-84,+69]mm · 4 of 53 slices shown (1 of 2)]
[im 1/53]
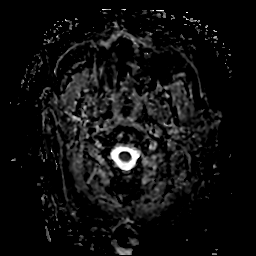
[im 18/53]
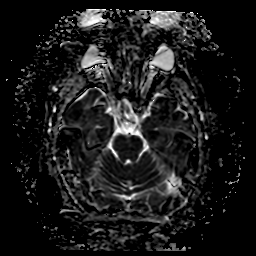
[im 35/53]
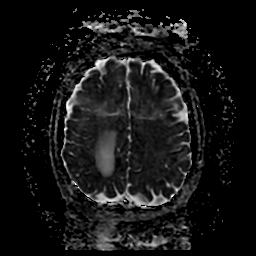
[im 53/53]
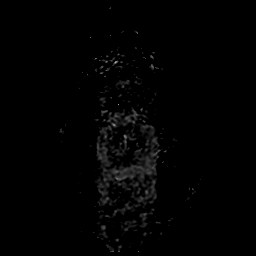

[Series 450: ADC · coronal · 4.0mm · 0.94mm/px · 3 of 38 slices shown (2 of 2)]
[im 1/38]
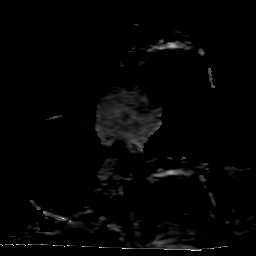
[im 19/38]
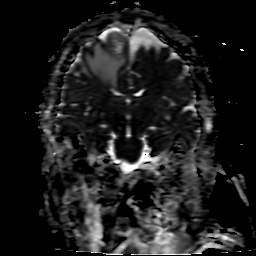
[im 38/38]
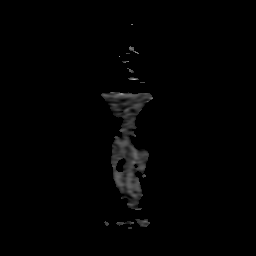

[26 of 48 positions shown; findings below may reference images not displayed]

FINDINGS: Brain: Hemorrhagic lesion of the paramedian superior right frontal
lobe measures 2.6 x 1.6 cm. There is moderate surrounding vasogenic
edema. There is a hematocrit level within the lesion. Normal white
matter signal, parenchymal volume and CSF spaces. The midline
structures are normal. Contrast enhancement of the above described
lesion is predominantly peripheral. There are no other
contrast-enhancing lesions.

Vascular: Major flow voids are preserved.

Skull and upper cervical spine: Normal calvarium and skull base.
Visualized upper cervical spine and soft tissues are normal.

Sinuses/Orbits:No paranasal sinus fluid levels or advanced mucosal
thickening. No mastoid or middle ear effusion. Normal orbits.
IMPRESSION: Hemorrhagic lesion of the paramedian superior right frontal lobe
with moderate surrounding vasogenic edema. This is most consistent
with a solitary metastasis.
